# Patient Record
Sex: Male | Born: 1955
Health system: Southern US, Community
[De-identification: ages and names within clinical notes are randomized; demographics above are authoritative.]

## PROBLEM LIST (undated history)

## (undated) DIAGNOSIS — E785 Hyperlipidemia, unspecified: Secondary | ICD-10-CM

## (undated) DIAGNOSIS — I469 Cardiac arrest, cause unspecified: Secondary | ICD-10-CM

## (undated) DIAGNOSIS — H269 Unspecified cataract: Secondary | ICD-10-CM

## (undated) DIAGNOSIS — I2510 Atherosclerotic heart disease of native coronary artery without angina pectoris: Secondary | ICD-10-CM

## (undated) DIAGNOSIS — H3500 Unspecified background retinopathy: Secondary | ICD-10-CM

## (undated) DIAGNOSIS — R413 Other amnesia: Secondary | ICD-10-CM

## (undated) DIAGNOSIS — I219 Acute myocardial infarction, unspecified: Secondary | ICD-10-CM

## (undated) DIAGNOSIS — Z955 Presence of coronary angioplasty implant and graft: Secondary | ICD-10-CM

## (undated) DIAGNOSIS — I10 Essential (primary) hypertension: Secondary | ICD-10-CM

## (undated) DIAGNOSIS — I509 Heart failure, unspecified: Secondary | ICD-10-CM

## (undated) DIAGNOSIS — E119 Type 2 diabetes mellitus without complications: Secondary | ICD-10-CM

## (undated) DIAGNOSIS — E11319 Type 2 diabetes mellitus with unspecified diabetic retinopathy without macular edema: Secondary | ICD-10-CM

## (undated) DIAGNOSIS — E538 Deficiency of other specified B group vitamins: Secondary | ICD-10-CM

## (undated) DIAGNOSIS — H4312 Vitreous hemorrhage, left eye: Secondary | ICD-10-CM

## (undated) HISTORY — PX: COLONOSCOPY: SHX174

## (undated) HISTORY — DX: Cardiac arrest, cause unspecified: I46.9

## (undated) HISTORY — PX: VITRECTOMY: SHX106

## (undated) HISTORY — PX: REFRACTIVE SURGERY: SHX103

## (undated) HISTORY — DX: Unspecified cataract: H26.9

## (undated) HISTORY — DX: Deficiency of other specified B group vitamins: E53.8

## (undated) HISTORY — PX: EYE SURGERY: SHX253

## (undated) HISTORY — DX: Acute myocardial infarction, unspecified: I21.9

## (undated) HISTORY — DX: Heart failure, unspecified: I50.9

## (undated) HISTORY — DX: Presence of coronary angioplasty implant and graft: Z95.5

## (undated) HISTORY — PX: UPPER GI ENDOSCOPY: SHX6162

## (undated) HISTORY — DX: Hyperlipidemia, unspecified: E78.5

## (undated) HISTORY — DX: Type 2 diabetes mellitus without complications: E11.9

## (undated) HISTORY — DX: Unspecified background retinopathy: H35.00

## (undated) HISTORY — PX: ANGIOPLASTY: SHX39

## (undated) HISTORY — DX: Essential (primary) hypertension: I10

## (undated) HISTORY — DX: Type 2 diabetes mellitus with unspecified diabetic retinopathy without macular edema: E11.319

---

## 2013-08-26 LAB — HM COLONOSCOPY

## 2014-09-07 ENCOUNTER — Ambulatory Visit: Payer: Self-pay

## 2016-04-15 ENCOUNTER — Other Ambulatory Visit: Payer: Self-pay | Admitting: Internal Medicine

## 2016-04-17 ENCOUNTER — Encounter: Payer: Self-pay | Admitting: Internal Medicine

## 2016-04-24 ENCOUNTER — Other Ambulatory Visit: Payer: Self-pay | Admitting: Internal Medicine

## 2016-04-25 ENCOUNTER — Other Ambulatory Visit: Payer: Self-pay | Admitting: Internal Medicine

## 2016-04-25 ENCOUNTER — Other Ambulatory Visit: Payer: BLUE CROSS/BLUE SHIELD | Admitting: Internal Medicine

## 2016-04-25 DIAGNOSIS — Z0000 Encounter for general adult medical examination without abnormal findings: Secondary | ICD-10-CM

## 2016-04-25 LAB — COMPREHENSIVE METABOLIC PANEL
ALT: 19 U/L (ref 9–46)
AST: 17 U/L (ref 10–35)
Albumin: 4.2 g/dL (ref 3.6–5.1)
Alkaline Phosphatase: 60 U/L (ref 40–115)
BUN: 19 mg/dL (ref 7–25)
CO2: 27 mmol/L (ref 20–31)
Calcium: 9.2 mg/dL (ref 8.6–10.3)
Chloride: 104 mmol/L (ref 98–110)
Creat: 1.15 mg/dL (ref 0.70–1.25)
Glucose, Bld: 172 mg/dL — ABNORMAL HIGH (ref 65–99)
Potassium: 5.2 mmol/L (ref 3.5–5.3)
Sodium: 140 mmol/L (ref 135–146)
Total Bilirubin: 0.5 mg/dL (ref 0.2–1.2)
Total Protein: 6.9 g/dL (ref 6.1–8.1)

## 2016-04-25 LAB — CBC WITH DIFFERENTIAL/PLATELET
Basophils Absolute: 0 cells/uL (ref 0–200)
Basophils Relative: 0 %
Eosinophils Absolute: 180 cells/uL (ref 15–500)
Eosinophils Relative: 2 %
HCT: 42.2 % (ref 38.5–50.0)
Hemoglobin: 14.2 g/dL (ref 13.2–17.1)
Lymphocytes Relative: 27 %
Lymphs Abs: 2430 cells/uL (ref 850–3900)
MCH: 30.8 pg (ref 27.0–33.0)
MCHC: 33.6 g/dL (ref 32.0–36.0)
MCV: 91.5 fL (ref 80.0–100.0)
MPV: 10.3 fL (ref 7.5–12.5)
Monocytes Absolute: 990 cells/uL — ABNORMAL HIGH (ref 200–950)
Monocytes Relative: 11 %
Neutro Abs: 5400 cells/uL (ref 1500–7800)
Neutrophils Relative %: 60 %
Platelets: 363 10*3/uL (ref 140–400)
RBC: 4.61 MIL/uL (ref 4.20–5.80)
RDW: 13.6 % (ref 11.0–15.0)
WBC: 9 10*3/uL (ref 3.8–10.8)

## 2016-04-25 LAB — LIPID PANEL
Cholesterol: 113 mg/dL — ABNORMAL LOW (ref 125–200)
HDL: 35 mg/dL — ABNORMAL LOW (ref >=40)
LDL Cholesterol: 59 mg/dL (ref <130)
Total CHOL/HDL Ratio: 3.2 Ratio (ref <=5.0)
Triglycerides: 97 mg/dL (ref <150)
VLDL: 19 mg/dL (ref <30)

## 2016-04-25 LAB — PSA: PSA: 1.2 ng/mL (ref <=4.0)

## 2016-04-25 LAB — TSH: TSH: 5.12 mIU/L — ABNORMAL HIGH (ref 0.40–4.50)

## 2016-04-25 NOTE — Progress Notes (Signed)
For labs only

## 2016-04-26 LAB — VITAMIN D 25 HYDROXY (VIT D DEFICIENCY, FRACTURES): Vit D, 25-Hydroxy: 27 ng/mL — ABNORMAL LOW (ref 30–100)

## 2016-04-28 ENCOUNTER — Ambulatory Visit (INDEPENDENT_AMBULATORY_CARE_PROVIDER_SITE_OTHER): Payer: BLUE CROSS/BLUE SHIELD | Admitting: Internal Medicine

## 2016-04-28 ENCOUNTER — Encounter: Payer: Self-pay | Admitting: Internal Medicine

## 2016-04-28 VITALS — BP 118/72 | HR 80 | Temp 97.8°F | Ht 68.0 in | Wt 186.5 lb

## 2016-04-28 DIAGNOSIS — Z9861 Coronary angioplasty status: Secondary | ICD-10-CM

## 2016-04-28 DIAGNOSIS — R946 Abnormal results of thyroid function studies: Secondary | ICD-10-CM | POA: Diagnosis not present

## 2016-04-28 DIAGNOSIS — B351 Tinea unguium: Secondary | ICD-10-CM

## 2016-04-28 DIAGNOSIS — IMO0001 Reserved for inherently not codable concepts without codable children: Secondary | ICD-10-CM

## 2016-04-28 DIAGNOSIS — Z23 Encounter for immunization: Secondary | ICD-10-CM

## 2016-04-28 DIAGNOSIS — H4313 Vitreous hemorrhage, bilateral: Secondary | ICD-10-CM | POA: Diagnosis not present

## 2016-04-28 DIAGNOSIS — Z794 Long term (current) use of insulin: Secondary | ICD-10-CM

## 2016-04-28 DIAGNOSIS — R5383 Other fatigue: Secondary | ICD-10-CM

## 2016-04-28 DIAGNOSIS — I252 Old myocardial infarction: Secondary | ICD-10-CM

## 2016-04-28 DIAGNOSIS — E119 Type 2 diabetes mellitus without complications: Secondary | ICD-10-CM

## 2016-04-28 DIAGNOSIS — Z0000 Encounter for general adult medical examination without abnormal findings: Secondary | ICD-10-CM | POA: Diagnosis not present

## 2016-04-28 DIAGNOSIS — R7989 Other specified abnormal findings of blood chemistry: Secondary | ICD-10-CM

## 2016-04-28 DIAGNOSIS — R0602 Shortness of breath: Secondary | ICD-10-CM

## 2016-04-28 MED ORDER — INSULIN ASPART 100 UNIT/ML ~~LOC~~ SOLN: 100.0000 [IU] | Freq: Three times a day (TID) | SUBCUTANEOUS | 5 refills | Status: DC

## 2016-04-28 MED ORDER — INSULIN GLARGINE 100 UNIT/ML ~~LOC~~ SOLN: 100.0000 [IU] | Freq: Every day | SUBCUTANEOUS | 5 refills | Status: DC

## 2016-04-28 MED ORDER — CARVEDILOL 12.5 MG PO TABS: 12.5000 mg | ORAL_TABLET | Freq: Two times a day (BID) | ORAL | 5 refills | Status: DC

## 2016-04-28 MED ORDER — OMEPRAZOLE 40 MG PO CPDR: 40.0000 mg | DELAYED_RELEASE_CAPSULE | Freq: Two times a day (BID) | ORAL | 5 refills | Status: DC

## 2016-04-28 MED ORDER — ATORVASTATIN CALCIUM 80 MG PO TABS: 80.0000 mg | ORAL_TABLET | Freq: Every day | ORAL | 5 refills | Status: DC

## 2016-04-28 MED ORDER — METOCLOPRAMIDE HCL 10 MG PO TABS: 10.0000 mg | ORAL_TABLET | Freq: Four times a day (QID) | ORAL | 5 refills | Status: DC

## 2016-04-28 NOTE — Progress Notes (Signed)
Subjective:    Patient ID: Jeffrey [name], male    DOB: June 26, 1956, 60 y.o.   MRN: [id number]  HPI First visit for this 60 year old White Male who moved here recently from [place] with his brother. Pt referred by Carmie Kanner.  He is on disability due to coronary disease and CHF. Says he is weak and tired most of the time with some SOB. Longstanding hx of DM. Used to have insulin pump but requested this be removed and now takes Lantus and Novolog.  Hx diabetic neuropathy and retinopathy.  Needs to establish with Opthalmologist and cardiologist.  Worried about onychomycosis. Saws it embarrasses him.  [employer] retired on disability. Lives with brother. Used to work for a [employer] in [place].  Family history: Father died of a MI. Mother died of complications of Alzheimer's disease with history of CVA. One brother died of an MI.  Patient is intolerant of codeine it causes nausea and penicillins cause rash.  He sustained a cardiac arrest 11/23/2013 for which he was taken to [facility] in [place]. He had PCI of the LAD. An echo in March 2017 revealed anterior septal and apical septal hypokinesis. Left ventricular ejection fraction was approximately 55%. No valvular abnormalities were noted. Cardiac PET CT/myocardial perfusion scan done November 01, 2015 revealed a moderately sized moderately intensity minimal reversible defect involving the apex and apical to mid inferior lateral segments suggestive of predominant scar mixed with minimal peri-infarct ischemia. This was thought to be a low risk finding. This had not changed significantly from prior study done January 2016. Severe hypokinesis of the apex was noted with a left ventricular ejection fraction of 58%.  Repeat cardiac catheterization was recommended to him in June when he was seen by cardiologist in [place] Dr. Heloise Purpura but he declined.  He is on Lipitor 80 mg daily.  Social history: Has never smoked. Has never used  smokeless tobacco. He does drink alcohol socially. Does not use illicit drugs.  Patient's complaints of dyspnea and fatigue seem to be out of proportion to what cardiologist [place] found. He complained of shortness of breath and fatigue. He goes to bed early every evening.  History of vitreous hemorrhages in both eyes. Has seen a retinal specialist in [place]. Apparently had eye surgeries in 2016 in 2017. Do not have those records. They have been requested but not received.  Current medications include appear unknown, generic Lipitor, valsartan, Carbo Dial, furosemide, 81 mg of aspirin, omeprazole 40 mg twice daily, Reglan 10 mg 4 times daily, Lantus 40 units 2 times a day and NovoLog with meals.  Cardiology care was given at [facility] [address] [place] [address]  Cardiologist is Dr. Esmeralda Arthur, phone number, area code [phone]  Primary care physician  Is Roanna Epley, [place], telephone area code [phone]  Ophthalmologist is Dr. Diona Foley at [facility], [place] phone [phone]  BMI in June 2017 in [place] was 28.74  EKG in [place] and June 2017 showed left anterior hemiblock and poor R rate and progression in anterior precordial chest leads. Normal sinus rhythm noted.  Closest living relative is his brother Aaryav Schiffler who resides with him.  We will be scanning the PET/CT myocardial perfusion scan from March 2017 and 2-D echocardiogram from March 2017 as well as EKG from June 2017 into his Epic chart  Review of Systems  Constitutional: Positive for fatigue.  HENT: Negative.   Eyes:       Hx of retinopathy  Respiratory: Positive for shortness of breath.   Gastrointestinal:       Hx reflux and gastroparesis  Genitourinary: Negative.   Neurological:       Peripheral neuropathy to ankles. Used to take gabapentin but prickng sensation subsided so he d/ced med. Now  just has numbness  Psychiatric/Behavioral:       Anxious       Objective:   Physical Exam  Constitutional: He is oriented to person, place, and time. He appears well-developed and well-nourished. No distress.  HENT:  Head: Normocephalic and atraumatic.  Right Ear: External ear normal.  Left Ear: External ear normal.  Mouth/Throat: Oropharynx is clear and moist. No oropharyngeal exudate.  Eyes: Conjunctivae and EOM are normal. Pupils are equal, round, and reactive to light. Right eye exhibits no discharge. Left eye exhibits no discharge. No scleral icterus.  Neck: Neck supple. No JVD present. No thyromegaly present.  Cardiovascular: Normal rate, regular rhythm and normal heart sounds.   No murmur heard. Pulmonary/Chest: No respiratory distress. He has no wheezes.  Abdominal: Soft. Bowel sounds are normal. He exhibits no distension and no mass. There is no tenderness. There is no rebound and no guarding.  Genitourinary: Prostate normal.  Musculoskeletal: He exhibits no edema.  Lymphadenopathy:    He has no cervical adenopathy.  Neurological: He is alert and oriented to person, place, and time. He has normal reflexes.  Decreased sensation to mid ankles bilaterally  Skin: Skin is warm and dry. No rash noted. He is not diaphoretic.  onychomycosis  Psychiatric: He has a normal mood and affect. His behavior is normal. Judgment and thought content normal.  Vitals reviewed.         Assessment & Plan:  Coronary disease  s/p PCI 2015   ? CHF- c/o fatigue and SOB  Elevated TSH likely has hypothyroidism- discussed low dose Synthroid  But pt read side effects and declined. He may discuss with Endocrinologist  Diabetic retinopathy-needs ophthalmologist locally Diabetic neuropathy  Insulin-dependent diabetes mellitus hemoglobin AIC 9.4%-needs better control-referred to endocrinologist  Possible hypothyroidism-does not want to take thyroid replacement at this time. Offered to repeat  TSH in a few weeks. Will let endocrinologist assess this  Vitamin D deficiency-recommend 2000 units vitamin D 3 daily   GERD  Possible diabetic gastroparesis-that could be the reason he's taking Reglan  ? Depression  Onychomycosis-refer to dermatologist for treatment with? Jubilia.

## 2016-04-29 ENCOUNTER — Encounter: Payer: Self-pay | Admitting: Internal Medicine

## 2016-04-29 LAB — HEMOGLOBIN A1C
Hgb A1c MFr Bld: 9.4 % — ABNORMAL HIGH (ref <5.7)
Mean Plasma Glucose: 223 mg/dL

## 2016-04-29 NOTE — Patient Instructions (Signed)
Referrals to cardiology, endocrinology and dermatology. Flu vaccine given. Discussed elevated TSH with endocrinologist. Return in 6 months.

## 2016-05-02 ENCOUNTER — Telehealth: Payer: Self-pay | Admitting: *Deleted

## 2016-05-02 ENCOUNTER — Encounter: Payer: Self-pay | Admitting: Endocrinology

## 2016-05-02 ENCOUNTER — Ambulatory Visit (INDEPENDENT_AMBULATORY_CARE_PROVIDER_SITE_OTHER): Payer: BLUE CROSS/BLUE SHIELD | Admitting: Endocrinology

## 2016-05-02 VITALS — BP 116/62 | HR 75 | Temp 98.2°F | Resp 16 | Ht 68.0 in | Wt 190.0 lb

## 2016-05-02 DIAGNOSIS — I10 Essential (primary) hypertension: Secondary | ICD-10-CM

## 2016-05-02 DIAGNOSIS — E118 Type 2 diabetes mellitus with unspecified complications: Secondary | ICD-10-CM

## 2016-05-02 DIAGNOSIS — I152 Hypertension secondary to endocrine disorders: Secondary | ICD-10-CM | POA: Insufficient documentation

## 2016-05-02 DIAGNOSIS — E1165 Type 2 diabetes mellitus with hyperglycemia: Secondary | ICD-10-CM

## 2016-05-02 DIAGNOSIS — Z794 Long term (current) use of insulin: Secondary | ICD-10-CM | POA: Diagnosis not present

## 2016-05-02 DIAGNOSIS — E119 Type 2 diabetes mellitus without complications: Secondary | ICD-10-CM | POA: Insufficient documentation

## 2016-05-02 DIAGNOSIS — E1159 Type 2 diabetes mellitus with other circulatory complications: Secondary | ICD-10-CM | POA: Insufficient documentation

## 2016-05-02 MED ORDER — ONETOUCH DELICA LANCETS 33G MISC: 3 refills | Status: DC

## 2016-05-02 MED ORDER — GLUCOSE BLOOD VI STRP: ORAL_STRIP | 3 refills | Status: DC

## 2016-05-02 NOTE — Patient Instructions (Addendum)
Check blood sugars on waking up = every other day  Also check blood sugars once a day before a different meals daily  Every other day check sugar 2-3 hours after dinner  Recommended blood sugar levels on waking up is 90-130 and about 2 hours after meal is 130-160  Please bring your blood sugar monitor to each visit, thank you  LANTUS: Continue taking 22 units twice a day  NOVOLOG: Take 5 units before breakfast and lunch and 7 units before dinner.  May take extra 1-2 units for eating more starchy foods or dessert  BREAKFAST: Try to add a protein like a boiled egg, slice of low fat cheese or meat

## 2016-05-02 NOTE — Progress Notes (Addendum)
Patient ID: Jeffrey [name], male   DOB: May 10, 1956, 60 y.o.   MRN: [id number]            Reason for Appointment: Consultation for Type 2 Diabetes  Referring physician: [place]   History of Present Illness:          Date of diagnosis of type 2 diabetes mellitus: In his 88s       Background history:   He was treated with some oral agents in the first few years of diagnosis with diabetes but subsequently has been on insulin using various regimens Level of control previously is not known as he is new to the town and no previous records are available  Recent history:   INSULIN regimen is: 22 Lantus bid. Novolog 5 units qid       Non-insulin hypoglycemic drugs the patient is taking are: none  Current management, blood sugar patterns and problems identified:   he has not A record of his blood sugars and is using Generic  Monitor  Mostly checking blood sugars fasting which are recently fairly good overall with some variability  Has sporadic high readings at night after dinner but not  Usually high at lunch, occasionally higher around dinner   he is taking the same amount of insulin  With every meal regardless of what he is eating.  He has been on the same dose of Lantus for quite sometime  Usually not getting any protein with his breakfast.  He has very limited activity level because of his dyspnea on exertion, prior to retirement he was more active throughout the day     Compliance with the medical regimen: variable Hypoglycemia: Rarely   Glucose monitoring:  done 2-3 times a day         Glucometer: One Touch.      Blood Glucose readings by review of home meter with overall average 228  Self-care: The diet that the patient has been following is: none, usually not eating out much and not drinking sweetened drinks       Meal times are:  Breakfast is at 8 Lunch: Dinner: 7   Typical meal intake: Breakfast is oatmeal  Lunch is sandwich , dinner is chicken or fish with pasta and  salad               Dietician visit, most recent: never               Exercise: a little  walking  Weight history:   Wt Readings from Last 3 Encounters:  05/02/16 190 lb (86.2 kg)  04/28/16 186 lb 8 oz (84.6 kg)    Glycemic control:   Lab Results  Component Value Date   HGBA1C 9.4 (H) 04/25/2016   Lab Results  Component Value Date   LDLCALC 59 04/25/2016   CREATININE 1.15 04/25/2016   No results found for: [facility]       Medication List       Accurate as of 05/02/16  2:55 PM. Always use your most recent med list.          atorvastatin 80 MG tablet Commonly known as:  LIPITOR Take 1 tablet (80 mg total) by mouth daily.   carvedilol 12.5 MG tablet Commonly known as:  COREG Take 1 tablet (12.5 mg total) by mouth 2 (two) times daily with a meal.   eplerenone 25 MG tablet Commonly known as:  INSPRA Take 25 mg by mouth daily.   furosemide 40 MG tablet Commonly known  as:  LASIX Take 40 mg by mouth daily.   insulin aspart 100 UNIT/ML injection Commonly known as:  novoLOG Inject 100 Units into the skin 3 (three) times daily before meals.   insulin glargine 100 UNIT/ML injection Commonly known as:  LANTUS Inject 1 mL (100 Units total) into the skin at bedtime.   metoCLOPramide 10 MG tablet Commonly known as:  REGLAN Take 1 tablet (10 mg total) by mouth 4 (four) times daily.   omeprazole 40 MG capsule Commonly known as:  PRILOSEC Take 1 capsule (40 mg total) by mouth 2 (two) times daily.   valsartan 80 MG tablet Commonly known as:  DIOVAN Take 80 mg by mouth daily.       Allergies: Not on File  Past Medical History:  Diagnosis Date  . Myocardial infarct ([place]) 2105    No past surgical history on file.  No family history on file.  Social History:  reports that he has never smoked. He has never used smokeless tobacco. He reports that he drinks alcohol. He reports that he does not use drugs.   Review of Systems  Constitutional: Negative  for weight loss.  HENT: Negative for headaches.   Eyes: Negative for visual disturbance.  Respiratory: Positive for shortness of breath.   Cardiovascular: Positive for palpitations. Negative for leg swelling.  Gastrointestinal: Negative for nausea and abdominal pain.       He has had long-standing history of reflux and previous gastroenterologist  told him to take Prilosec twice a day and Reglan 4 times a day, no recent symptoms  Endocrine: Negative for fatigue, general weakness and polydipsia.  Musculoskeletal: Negative for joint pain.  Skin: Negative for itching.  Neurological:       He is tending to be more forgetful Occasional tingling in feet.  Previously had sharp pains treated with gabapentin in his legs  Psychiatric/Behavioral: Positive for nervousness.     Lipid history: On Lipitor with good control    Lab Results  Component Value Date   CHOL 113 (L) 04/25/2016   HDL 35 (L) 04/25/2016   LDLCALC 59 04/25/2016   TRIG 97 04/25/2016   CHOLHDL 3.2 04/25/2016           Hypertension: Present, has been recently controlled, also on carvedilol for history of CHF along with Diovan  Most recent eye exam was 2017, has had Laser Treatment for retinopathy  Most recent foot exam: 9/17 No recent pains in  feet or complaints of numbness but occasional tingling   LABS:  No visits with results within 1 Week(s) from this visit.  Latest known visit with results is:  Orders Only on 04/25/2016  Component Date Value Ref Range Status  . Hgb A1c MFr Bld 04/29/2016 9.4* <5.7 % Final   Comment:   For someone without known diabetes, a hemoglobin A1c value of 6.5% or greater indicates that they may have diabetes and this should be confirmed with a follow-up test.   For someone with known diabetes, a value <7% indicates that their diabetes is well controlled and a value greater than or equal to 7% indicates suboptimal control. A1c targets should be individualized based on duration of  diabetes, age, comorbid conditions, and other considerations.   Currently, no consensus exists for use of hemoglobin A1c for diagnosis of diabetes for children.     . Mean Plasma Glucose 04/29/2016 223  mg/dL Final    Physical Examination:  BP 116/62   Pulse 75   Temp 98.2 F (  36.8 C)   Resp 16   Ht 5\' 8"  (1.727 m)   Wt 190 lb (86.2 kg)   SpO2 95%   BMI 28.89 kg/m   GENERAL:         Patient has Abdominal obesity.   HEENT:         Eye exam shows normal external appearance. Fundus exam shows no retinopathy. Oral exam shows normal mucosa .  NECK:   There is no lymphadenopathy Thyroid is not enlarged and no nodules felt.  Carotids are normal to palpation and no bruit heard LUNGS:         Chest is symmetrical. Lungs are clear to auscultation.[name] Kitchen   HEART:         Heart sounds:  S1 and S2 are normal. No murmur or click heard., no S3 or S4.   ABDOMEN:   There is no distention present. Liver and spleen are not palpable. No other mass or tenderness present.   NEUROLOGICAL:   Ankle jerks are absent bilaterally.    Diabetic Foot Exam - Simple   Simple Foot Form Diabetic Foot exam was performed with the following findings:  Yes 05/02/2016  2:53 PM  Visual Inspection See comments:  Yes Sensation Testing See comments:  Yes Pulse Check Posterior Tibialis and Dorsalis pulse intact bilaterally:  Yes Comments Absent monofilament sensation in feet Mild onychomycosis present            Vibration sense is  Absent in distal first toes. MUSCULOSKELETAL:  There is no swelling or deformity of the peripheral joints. Spine is normal to inspection.   EXTREMITIES:     There is no edema. No skin lesions present.[name] Kitchen SKIN:       No rash or lesions of concern.        ASSESSMENT:  Diabetes, uncontrolled He has had diabetes for 40 years and although he may have started with type 2 diabetes he is essentially insulin-dependent Recently has had poor control with A1c over 9% which he thinks is partly from  his difficulty with compliance with diet and insulin before his move here Difficult to get an idea what his blood sugar patterns are since he is using a Generic monitor and not keeping any record Currently taking only fixed doses of 5 units of Novolog at mealtimes Fasting readings are also variable and not clear if his meter is accurate     Complications of diabetes: Peripheral neuropathy with sensory loss, history of retinopathy  HYPERTENSION: Well controlled, also on carvedilol for history of CHF along with Diovan  PLAN:    Continue Lantus 22 units twice a day but consider switching to at least the SoloSTAR pen  If able to get coverage may prefer using Toujeo once a day   Since he appears to have mostly high readings after supper he will take 7 units of NovoLog before eating  He will need to discuss insulin adjustment in detail with nurse educator  Add protein to breakfast daily  He will avoid taking any Novolog at bedtime to avoid potential hypoglycemia  He was instructed on how to use a One Touch ultra mini testing meter as this appears to be covered  Booklet on foot care given, discussed general care principles to avoid neuropathic ulcers  Patient Instructions  Check blood sugars on waking up = every other day  Also check blood sugars once a day before a different meals daily  Every other day check sugar 2-3 hours after dinner  Recommended blood  sugar levels on waking up is 90-130 and about 2 hours after meal is 130-160  Please bring your blood sugar monitor to each visit, thank you  LANTUS: Continue taking 22 units twice a day  NOVOLOG: Take 5 units before breakfast and lunch and 7 units before dinner.  May take extra 1-2 units for eating more starchy foods or dessert  BREAKFAST: Try to add a protein like a boiled egg, slice of low fat cheese or meat   Counseling time on subjects discussed above is over 50% of today's 60 minute visit  Cyenna Rebello 05/02/2016, 2:55 PM    Note: This office note was prepared with Estate agent. Any transcriptional errors that result from this process are unintentional.

## 2016-05-02 NOTE — Telephone Encounter (Signed)
[facility] stated patient insurance will not cover his prescriptions please advise [phone]

## 2016-05-05 ENCOUNTER — Telehealth: Payer: Self-pay | Admitting: Endocrinology

## 2016-05-05 ENCOUNTER — Other Ambulatory Visit: Payer: Self-pay | Admitting: *Deleted

## 2016-05-05 MED ORDER — BAYER CONTOUR NEXT MONITOR W/DEVICE KIT: PACK | 0 refills | Status: DC

## 2016-05-05 MED ORDER — GLUCOSE BLOOD VI STRP: ORAL_STRIP | 3 refills | Status: DC

## 2016-05-05 MED ORDER — BAYER MICROLET LANCETS MISC: 2 refills | Status: DC

## 2016-05-05 NOTE — Telephone Encounter (Signed)
Rx sent for contour meter and strips, lancets.

## 2016-05-05 NOTE — Telephone Encounter (Signed)
Jeffrey [name] from [place] stated patient insurance will only cover the countor next meter test, need a prescription

## 2016-05-08 ENCOUNTER — Other Ambulatory Visit: Payer: Self-pay | Admitting: *Deleted

## 2016-05-08 MED ORDER — GLUCOSE BLOOD VI STRP: ORAL_STRIP | 3 refills | Status: DC

## 2016-05-08 MED ORDER — BAYER MICROLET LANCETS MISC: 2 refills | Status: DC

## 2016-05-08 NOTE — Telephone Encounter (Signed)
Increase to 4 times a day

## 2016-05-08 NOTE — Telephone Encounter (Signed)
Please see below and advise if okay to increase the amount of strips?

## 2016-05-08 NOTE — Telephone Encounter (Signed)
Noted, rx sent.

## 2016-05-08 NOTE — Telephone Encounter (Signed)
Pt is stating he has been increased to checking his BS 4 times daily instead of 3 times daily and the lancets please to be called in  [facility] [address]

## 2016-05-12 ENCOUNTER — Telehealth: Payer: Self-pay | Admitting: Internal Medicine

## 2016-05-12 ENCOUNTER — Other Ambulatory Visit: Payer: Self-pay | Admitting: *Deleted

## 2016-05-12 MED ORDER — FUROSEMIDE 40 MG PO TABS: 40.0000 mg | ORAL_TABLET | Freq: Every day | ORAL | 5 refills | Status: DC

## 2016-05-12 MED ORDER — EPLERENONE 25 MG PO TABS: 25.0000 mg | ORAL_TABLET | Freq: Every day | ORAL | 5 refills | Status: DC

## 2016-05-12 MED ORDER — VALSARTAN 80 MG PO TABS: 80.0000 mg | ORAL_TABLET | Freq: Every day | ORAL | 5 refills | Status: DC

## 2016-05-12 NOTE — Telephone Encounter (Signed)
Requesting refills on his Lasix, Valsartan and Eplerenone (Inspra).  He would like to have 5 refills (to total 6) like he has on the rest of his medications.  Can he please have these called in to his pharmacy?  [facility] [address]  Thank you.

## 2016-05-12 NOTE — Telephone Encounter (Signed)
Please refill as requested.

## 2016-05-19 ENCOUNTER — Telehealth: Payer: Self-pay | Admitting: Internal Medicine

## 2016-05-19 ENCOUNTER — Other Ambulatory Visit: Payer: Self-pay | Admitting: *Deleted

## 2016-05-19 MED ORDER — FUROSEMIDE 40 MG PO TABS: 40.0000 mg | ORAL_TABLET | Freq: Every day | ORAL | 1 refills | Status: DC

## 2016-05-19 NOTE — Telephone Encounter (Signed)
Patient said she would like a refill on her Furosemide 40mg  tablet.

## 2016-05-19 NOTE — Telephone Encounter (Signed)
The patient is a male not a male. Please refill x 90 days.

## 2016-05-19 NOTE — Telephone Encounter (Signed)
Done

## 2016-05-20 ENCOUNTER — Telehealth: Payer: Self-pay | Admitting: Internal Medicine

## 2016-05-20 NOTE — Telephone Encounter (Signed)
He may call Dr. Nena Polio for appointment.

## 2016-05-20 NOTE — Telephone Encounter (Signed)
Left Dr. Hilarie Fredrickson. [place] contact information on the patient's answering machine.

## 2016-05-20 NOTE — Telephone Encounter (Signed)
Patient would like a referral to a male Dermatologist for a routine body check.

## 2016-05-21 ENCOUNTER — Encounter: Payer: Self-pay | Admitting: Dietician

## 2016-05-21 ENCOUNTER — Encounter: Payer: BLUE CROSS/BLUE SHIELD | Attending: Internal Medicine | Admitting: Dietician

## 2016-05-21 ENCOUNTER — Other Ambulatory Visit: Payer: Self-pay | Admitting: *Deleted

## 2016-05-21 DIAGNOSIS — Z713 Dietary counseling and surveillance: Secondary | ICD-10-CM | POA: Insufficient documentation

## 2016-05-21 DIAGNOSIS — E119 Type 2 diabetes mellitus without complications: Secondary | ICD-10-CM | POA: Diagnosis not present

## 2016-05-21 DIAGNOSIS — Z794 Long term (current) use of insulin: Secondary | ICD-10-CM | POA: Diagnosis not present

## 2016-05-21 DIAGNOSIS — E1165 Type 2 diabetes mellitus with hyperglycemia: Secondary | ICD-10-CM

## 2016-05-21 MED ORDER — "INSULIN SYRINGE-NEEDLE U-100 30G X 5/16"" 1 ML MISC": 3 refills | Status: DC

## 2016-05-21 NOTE — Patient Instructions (Signed)
When you have a dessert, keep the portion size small. When you are at a party, choose small amounts of the cheese, veges, fresh fruit and limit sweets.  Choose something without sugar to drink. Add a protein such as a boiled egg or walnuts or almonds to your oatmeal. Be sure to have a protein with lunch (leftover meat from dinner, low sodium [place], or swiss cheese). When you treat a low blood sugar.  Drink 1/2 cup juice, recheck your blood sugar and if it is still low then have 1/2 cup more of juice.  When your blood sugar returns to normal, then have a meal or snack with protein. Continue to avoid added salt, rinse canned beans and avoid processed foods. Eating out has a lot of sodium.  It is best to go to a restaurant that you can ask them not to add salt to your foods.

## 2016-05-21 NOTE — Progress Notes (Signed)
Cardiology Office Note   Date:  05/22/2016   ID:  Jeffrey [name], DOB 1955/12/02, MRN [id number]  PCP:  Elby Showers, MD  Cardiologist:   Skeet Latch, MD   Chief Complaint  Patient presents with  . New Evaluation    pt moved from [place], here to establish care, pt had a heart attack in 2015.      History of Present Illness: Jeffrey [name] is a 60 y.o. male with hypertension, hyperlipidemia, diabetes, peripheral neuropathy, and heart failure who presents to establish care.  Jeffrey [name] recently moved to [facility] from [place] and presents to establish care.  He moved here with his identical twin brother.  Jeffrey [name] had a cardiac arrest 11/23/13 and underwent PCI of the LAD.  Echo 10/2015 revealed an anteroseptal and apical septal infarct with associated hypokinesis.  LVEF was 55%.  He had a PET CT/myocardial perfusion scan 10/2015 that revealed a moderate sized, moderate intensity antero-apical infarct with mild ischemia.  At the end of 2016 he had an episode of stabbing chest pain.  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  At that time his clopidogrel was discontinued and he underwent several laser procedures.    He has worked in a hospice facility as an Administrator, arts.  He is now on disability because he is unable to lift patients.  He goes walking at least three times per week which takes approximately 30 minutes.  He denies chest pain or shortness of breath with this activity.  He hasn't noted any lower extremity edema, orthopnea or PND.  He gets short of breath with ADLs.    Past Medical History:  Diagnosis Date  . Cardiac arrest ([place]) 05/22/2016  . CHF (congestive heart failure) ([place])   . Diabetes mellitus without complication ([place])   . Hyperlipidemia   . Hypertension   . Myocardial infarct 2105  . S/P primary angioplasty with coronary stent 05/22/2016    History reviewed. No pertinent surgical history.   Current  Outpatient Prescriptions  Medication Sig Dispense Refill  . aspirin EC 81 MG tablet Take 81 mg by mouth daily.    [name] Kitchen atorvastatin (LIPITOR) 80 MG tablet Take 1 tablet (80 mg total) by mouth daily. 30 tablet 5  . carvedilol (COREG) 12.5 MG tablet Take 1 tablet (12.5 mg total) by mouth 2 (two) times daily with a meal. 60 tablet 5  . cholecalciferol (VITAMIN D) 1000 units tablet Take 2,000 Units by mouth daily.    [name] Kitchen eplerenone (INSPRA) 25 MG tablet Take 1 tablet (25 mg total) by mouth daily. 30 tablet 5  . furosemide (LASIX) 40 MG tablet Take 1 tablet (40 mg total) by mouth daily. 90 tablet 1  . insulin aspart (NOVOLOG) 100 UNIT/ML injection Inject 100 Units into the skin 3 (three) times daily before meals. (Patient taking differently: Inject 5 Units into the skin 3 (three) times daily with meals. ) 30 mL 5  . insulin glargine (LANTUS) 100 UNIT/ML injection Inject 1 mL (100 Units total) into the skin at bedtime. (Patient taking differently: Inject 22 Units into the skin 2 (two) times daily. ) 30 mL 5  . metoCLOPramide (REGLAN) 10 MG tablet Take 1 tablet (10 mg total) by mouth 4 (four) times daily. 120 tablet 5  . omeprazole (PRILOSEC) 40 MG capsule Take 1 capsule (40 mg total) by mouth 2 (two) times daily. 60 capsule 5  . valsartan (DIOVAN) 80 MG  tablet Take 1 tablet (80 mg total) by mouth daily. 30 tablet 5   No current facility-administered medications for this visit.     Allergies:   Codeine and Penicillins    Social History:  The patient  reports that he has never smoked. He has never used smokeless tobacco. He reports that he drinks about 0.6 oz of alcohol per week . He reports that he does not use drugs.   Family History:  The patient's family history includes Alzheimer's disease in his mother; Heart disease in his brother and father; Peripheral Artery Disease in his father.    ROS:  Please see the history of present illness.   Otherwise, review of systems are positive for none.   All  other systems are reviewed and negative.    PHYSICAL EXAM: VS:  BP (!) 146/84   Pulse 63   Ht 5\' 8"  (1.727 m)   Wt 189 lb 6.4 oz (85.9 kg)   BMI 28.80 kg/m  , BMI Body mass index is 28.8 kg/m. GENERAL:  Well appearing HEENT:  Pupils equal round and reactive, fundi not visualized, oral mucosa unremarkable NECK:  No jugular venous distention, waveform within normal limits, carotid upstroke brisk and symmetric, no bruits, no thyromegaly LYMPHATICS:  No cervical adenopathy LUNGS:  Clear to auscultation bilaterally HEART:  RRR.  PMI not displaced or sustained,S1 and S2 within normal limits, no S3, no S4, no clicks, no rubs, no  murmurs ABD:  Flat, positive bowel sounds normal in frequency in pitch, no bruits, no rebound, no guarding, no midline pulsatile mass, no hepatomegaly, no splenomegaly EXT:  2 plus pulses throughout, no edema, no cyanosis no clubbing SKIN:  No rashes no nodules NEURO:  Cranial nerves II through XII grossly intact, motor grossly intact throughout PSYCH:  Cognitively intact, oriented to person place and time    EKG:  EKG is ordered today. The ekg ordered today demonstrates sinus rhythm rate 63 bpm.  Prior anteroseptal infarct.  LAFB   Recent Labs: 04/25/2016: ALT 19; BUN 19; Creat 1.15; Hemoglobin 14.2; Platelets 363; Potassium 5.2; Sodium 140; TSH 5.12    Lipid Panel    Component Value Date/Time   CHOL 113 (L) 04/25/2016 1156   TRIG 97 04/25/2016 1156   HDL 35 (L) 04/25/2016 1156   CHOLHDL 3.2 04/25/2016 1156   VLDL 19 04/25/2016 1156   LDLCALC 59 04/25/2016 1156    09/07/15: Sodium 138, potassium 4.5, BUN 20, creatinine 1.02 AST 17, ALT 24 Total cholesterol 113, triglycerides 146, HDL 32, LDL 52 TSH 1.76 WBC 11.7, hemoglobin 13.4, hematocrit 40.2, platelets 317 Hemoglobin A1c 8.9 on a relatively dry%  Wt Readings from Last 3 Encounters:  05/22/16 189 lb 6.4 oz (85.9 kg)  05/21/16 192 lb (87.1 kg)  05/02/16 190 lb (86.2 kg)      ASSESSMENT  AND PLAN:  # CAD s/p cardiac arrest and LAD PCI: Jeffrey [name] is doing well and denies any recent chest pain.  Continue carvedilol, aspirin 81 mg and atorvastatin.    # Hypertension:  Blood pressure is well-controlled on carvedilol and valsartan.  Repeat BP was 126/72.  # Hyperlipidemia.  LDL 59 on 04/2016.  Continue atorvastatin.     Current medicines are reviewed at length with the patient today.  The patient does not have concerns regarding medicines.  The following changes have been made:  no change  Labs/ tests ordered today include:  No orders of the defined types were placed in this encounter.  Disposition:   FU with Aarya Robinson C. Oval Linsey, MD, [facility] in 6 months   This note was written with the assistance of speech recognition software.  Please excuse any transcriptional errors.  Signed, Jaquavion Mccannon C. Oval Linsey, MD, [facility]  05/22/2016 1:31 PM    [facility]

## 2016-05-22 ENCOUNTER — Ambulatory Visit (INDEPENDENT_AMBULATORY_CARE_PROVIDER_SITE_OTHER): Payer: BLUE CROSS/BLUE SHIELD | Admitting: Cardiovascular Disease

## 2016-05-22 ENCOUNTER — Encounter: Payer: Self-pay | Admitting: Cardiovascular Disease

## 2016-05-22 VITALS — BP 146/84 | HR 63 | Ht 68.0 in | Wt 189.4 lb

## 2016-05-22 DIAGNOSIS — I469 Cardiac arrest, cause unspecified: Secondary | ICD-10-CM

## 2016-05-22 DIAGNOSIS — Z8674 Personal history of sudden cardiac arrest: Secondary | ICD-10-CM | POA: Insufficient documentation

## 2016-05-22 DIAGNOSIS — Z955 Presence of coronary angioplasty implant and graft: Secondary | ICD-10-CM | POA: Diagnosis not present

## 2016-05-22 DIAGNOSIS — E7800 Pure hypercholesterolemia, unspecified: Secondary | ICD-10-CM

## 2016-05-22 DIAGNOSIS — I10 Essential (primary) hypertension: Secondary | ICD-10-CM | POA: Diagnosis not present

## 2016-05-22 HISTORY — DX: Cardiac arrest, cause unspecified: I46.9

## 2016-05-22 HISTORY — DX: Presence of coronary angioplasty implant and graft: Z95.5

## 2016-05-22 NOTE — Progress Notes (Signed)
Diabetes Self-Management Education  Visit Type: First/Initial  Appt. Start Time: 1100 Appt. End Time: [id number]  05/22/2016  [name], identified by name and date of birth, is a 60 y.o. male with a diagnosis of Diabetes: Type 2 (insulin dependent). For the past 40 years.  He has an identical twin brother who does not have diabetes.  Other hx includes vitamin D deficiency, severe neuropathy, MI requiring life support and resulting CHF.  He takes Lantus 22 units each am and 22 units each HS.  He takes Novolog tid 5-7 units depending on meal size.  His TSH was 5.12 and he just wants to monitor and refused medication.  His diet is very consistent except for when he is out to eat, at friends, or when friends bring food by.  He does not use added salt and mostly fresh foods. His short term memory is poor since his MI.  I do not feel he is capable of carbohydrate counting at this time.  Patient lives with his twin brother.  He moved from [place] in August due to it being less expensive to live here.  He is a retired Probation officer.  ASSESSMENT  Height 5\' 8"  (1.727 m), weight 192 lb (87.1 kg). Body mass index is 29.19 kg/m.      Diabetes Self-Management Education - 05/21/16 1140      Visit Information   Visit Type First/Initial     Initial Visit   Diabetes Type Type 2  insulin dependent   Are you currently following a meal plan? No   Are you taking your medications as prescribed? Yes   Date Diagnosed 20 years ago     Health Coping   How would you rate your overall health? Good     Psychosocial Assessment   Patient Belief/Attitude about Diabetes Motivated to manage diabetes   Self-care barriers Other (comment);Lack of material resources  some short term memory problems   Self-management support Doctor's office   Other persons present Patient   Patient Concerns Nutrition/Meal planning;Glycemic Control;Weight Control   Special Needs None   Preferred Learning Style No  preference indicated   Learning Readiness Ready   How often do you need to have someone help you when you read instructions, pamphlets, or other written materials from your doctor or pharmacy? 1 - Never   What is the last grade level you completed in school? 2 years college     Pre-Education Assessment   Patient understands the diabetes disease and treatment process. Demonstrates understanding / competency   Patient understands incorporating nutritional management into lifestyle. Needs Review   Patient undertands incorporating physical activity into lifestyle. Demonstrates understanding / competency   Patient understands using medications safely. Demonstrates understanding / competency   Patient understands monitoring blood glucose, interpreting and using results Demonstrates understanding / competency   Patient understands prevention, detection, and treatment of acute complications. Demonstrates understanding / competency   Patient understands prevention, detection, and treatment of chronic complications. Demonstrates understanding / competency   Patient understands how to develop strategies to address psychosocial issues. Needs Review   Patient understands how to develop strategies to promote health/change behavior. Demonstrates understanding / competency     Complications   Last HgB A1C per patient/outside source 9.4 %  04/29/16   How often do you check your blood sugar? 3-4 times/day   Fasting Blood glucose range (mg/dL) 70-129;130-179  in the last week but prior was over 200 often   Number of hypoglycemic episodes  per month 1   Can you tell when your blood sugar is low? Yes   What do you do if your blood sugar is low? drinks OJ   Number of hyperglycemic episodes per week 14   Can you tell when your blood sugar is high? Yes   What do you do if your blood sugar is high? drinks more water   Have you had a dilated eye exam in the past 12 months? Yes   Have you had a dental exam in the  past 12 months? Yes   Are you checking your feet? Yes   How many days per week are you checking your feet? 7     Dietary Intake   Breakfast 2 packs raisin cinnamon instant oatmeal, Fat free milk, 3 cups coffee with milk  8   Snack (morning) cashew   Lunch lettuce, tomato, and occasional cheese or low sodium [place] sandwich on rye bread with mayo and potato salad OR salad without protein and honey mustard dressing and potato salad   Snack (afternoon) occasional pie or cake or cookies   Dinner fish or chicken and vegetables, salad, red potatoes   Snack (evening) none   Beverage(s) water, coffee, fat free milk, hot tea, juice only with low blood sugar, occasional diet soda, occasional lite beer (1-2 cans per week)     Exercise   Exercise Type ADL's   How many days per week to you exercise? 0   How many minutes per day do you exercise? 0   Total minutes per week of exercise 0     Patient Education   Previous Diabetes Education Yes (please comment)  Yes but too long to remember   Disease state  Definition of diabetes, type 1 and 2, and the diagnosis of diabetes   Nutrition management  Role of diet in the treatment of diabetes and the relationship between the three main macronutrients and blood glucose level;Food label reading, portion sizes and measuring food.;Meal options for control of blood glucose level and chronic complications.;Information on hints to eating out and maintain blood glucose control.   Acute complications Taught treatment of hypoglycemia - the 15 rule.   Chronic complications Assessed and discussed foot care and prevention of foot problems   Psychosocial adjustment Worked with patient to identify barriers to care and solutions;Role of stress on diabetes   Personal strategies to promote health Lifestyle issues that need to be addressed for better diabetes care     Individualized Goals (developed by patient)   Nutrition General guidelines for healthy choices and portions  discussed   Physical Activity Not Applicable   Medications take my medication as prescribed   Monitoring  test my blood glucose as discussed   Problem Solving what to do in social situations and eating out   Reducing Risk examine blood glucose patterns;do foot checks daily;treat hypoglycemia with 15 grams of carbs if blood glucose less than 70mg /dL     Post-Education Assessment   Patient understands the diabetes disease and treatment process. Demonstrates understanding / competency   Patient understands incorporating nutritional management into lifestyle. Demonstrates understanding / competency   Patient undertands incorporating physical activity into lifestyle. Demonstrates understanding / competency   Patient understands using medications safely. Demonstrates understanding / competency   Patient understands monitoring blood glucose, interpreting and using results Demonstrates understanding / competency   Patient understands prevention, detection, and treatment of acute complications. Demonstrates understanding / competency   Patient understands prevention, detection, and treatment of  chronic complications. Demonstrates understanding / competency   Patient understands how to develop strategies to address psychosocial issues. Demonstrates understanding / competency   Patient understands how to develop strategies to promote health/change behavior. Demonstrates understanding / competency     Outcomes   Expected Outcomes Demonstrated interest in learning. Expect positive outcomes   Future DMSE PRN   Program Status Completed      Individualized Plan for Diabetes Self-Management Training:   Learning Objective:  Patient will have a greater understanding of diabetes self-management. Patient education plan is to attend individual and/or group sessions per assessed needs and concerns. Also discussed the importance of a strict low sodium diet.  Plan:   Patient Instructions  When you have a  dessert, keep the portion size small. When you are at a party, choose small amounts of the cheese, veges, fresh fruit and limit sweets.  Choose something without sugar to drink. Add a protein such as a boiled egg or walnuts or almonds to your oatmeal. Be sure to have a protein with lunch (leftover meat from dinner, low sodium [place], or swiss cheese). When you treat a low blood sugar.  Drink 1/2 cup juice, recheck your blood sugar and if it is still low then have 1/2 cup more of juice.  When your blood sugar returns to normal, then have a meal or snack with protein. Continue to avoid added salt, rinse canned beans and avoid processed foods. Eating out has a lot of sodium.  It is best to go to a restaurant that you can ask them not to add salt to your foods.     Expected Outcomes:  Demonstrated interest in learning. Expect positive outcomes  Education material provided: Living Well with Diabetes, Food label handouts, Meal plan card, My Plate and Snack sheet, label reading, low sodium label reading, Nutrition therapy for a low sodium diet.  If problems or questions, patient to contact team via:  Phone and Email  Future DSME appointment: PRN

## 2016-05-22 NOTE — Patient Instructions (Signed)

## 2016-05-23 ENCOUNTER — Other Ambulatory Visit: Payer: Self-pay | Admitting: Internal Medicine

## 2016-05-23 ENCOUNTER — Encounter: Payer: BLUE CROSS/BLUE SHIELD | Admitting: Dietician

## 2016-05-29 ENCOUNTER — Other Ambulatory Visit (INDEPENDENT_AMBULATORY_CARE_PROVIDER_SITE_OTHER): Payer: BLUE CROSS/BLUE SHIELD

## 2016-05-29 DIAGNOSIS — E1165 Type 2 diabetes mellitus with hyperglycemia: Secondary | ICD-10-CM | POA: Diagnosis not present

## 2016-05-29 DIAGNOSIS — Z794 Long term (current) use of insulin: Secondary | ICD-10-CM

## 2016-05-29 LAB — BASIC METABOLIC PANEL
BUN: 26 mg/dL — ABNORMAL HIGH (ref 6–23)
CO2: 29 mEq/L (ref 19–32)
Calcium: 9.5 mg/dL (ref 8.4–10.5)
Chloride: 101 mEq/L (ref 96–112)
Creatinine, Ser: 1.13 mg/dL (ref 0.40–1.50)
GFR: 69.99 mL/min (ref >60.00)
Glucose, Bld: 222 mg/dL — ABNORMAL HIGH (ref 70–99)
Potassium: 4.9 mEq/L (ref 3.5–5.1)
Sodium: 137 mEq/L (ref 135–145)

## 2016-05-29 LAB — MICROALBUMIN / CREATININE URINE RATIO
Creatinine,U: 67.9 mg/dL
Microalb Creat Ratio: 1 mg/g (ref 0.0–30.0)
Microalb, Ur: <0.7 mg/dL (ref 0.0–1.9)

## 2016-05-30 LAB — FRUCTOSAMINE: Fructosamine: 378 umol/L — ABNORMAL HIGH (ref 0–285)

## 2016-06-03 ENCOUNTER — Ambulatory Visit (INDEPENDENT_AMBULATORY_CARE_PROVIDER_SITE_OTHER): Payer: BLUE CROSS/BLUE SHIELD | Admitting: Endocrinology

## 2016-06-03 ENCOUNTER — Encounter: Payer: Self-pay | Admitting: Endocrinology

## 2016-06-03 VITALS — BP 92/60 | HR 80 | Temp 98.3°F | Resp 14 | Ht 68.0 in | Wt 190.6 lb

## 2016-06-03 DIAGNOSIS — E1165 Type 2 diabetes mellitus with hyperglycemia: Secondary | ICD-10-CM

## 2016-06-03 DIAGNOSIS — Z794 Long term (current) use of insulin: Secondary | ICD-10-CM | POA: Diagnosis not present

## 2016-06-03 NOTE — Progress Notes (Signed)
Patient ID: Jeffrey [name], male   DOB: February 02, 1956, 60 y.o.   MRN: [id number]            Reason for Appointment: for Type 2 Diabetes  Referring physician: [place]   History of Present Illness:          Date of diagnosis of type 2 diabetes mellitus: In his 44s       Background history:   He was treated with some oral agents in the first few years of diagnosis with diabetes but subsequently has been on insulin using various regimens Level of control previously is not known as he is new to the town and no previous records are available  Recent history:   INSULIN regimen is: 22 Lantus bid, pm dose 10 pm. Novolog 5 -7 units at meals    Non-insulin hypoglycemic drugs the patient is taking are: none  His last A1c was 9.4 and recent fructosamine is still high at 378  Current management, blood sugar patterns and problems identified:  He has been checking his blood sugar with a Contour meter and now able to better assess his blood sugar patterns.  He has seen the dietitian and he thinks that has been useful with improving his diet and getting more balanced meals per  Although he was told to continue Lantus 22 units twice a day he will adjust this periodically based on his blood sugar at the time of injection.  He claims that if he does not wait until 10:30 PM to take his Lantus if sugars will be high in the morning  FASTING blood sugars are overall reasonable but tends to fluctuate  Blood sugars are generally better midday and afternoon  HIGHEST blood sugars are after about 7 PM but he has only about 8 readings in the last month at night  He thinks he is taking 7 units at suppertime as directed for Novolog but not clear if he does this consistently; also admits that occasionally may miss his insulin causing blood sugars over 300 at night  No hypoglycemia, lowest reading was at about 1 PM which was 67  He has very limited activity level because of his dyspnea on exertion, prior to  retirement he was more active throughout the day     Compliance with the medical regimen: variable Hypoglycemia: Rarely   Glucose monitoring:  done 2-3 times a day         Glucometer: One Touch.      Blood Glucose readings by review of home meter with overall average 168,  was 228  Mean values apply above for all meters except median for One Touch  PRE-MEAL Fasting Lunch Afternoon  Bedtime Overall  Glucose range: 103-253  67-201  76-174  180-389    Mean/median: 158  120    168+/-75     Self-care:        Meal times are:  Breakfast is at 8 Lunch: Dinner: 7 pm   Typical meal intake: Breakfast is oatmeal  Lunch is sandwich , dinner is chicken or fish with pasta and salad               Dietician visit, most recent: 9/17               Exercise: a little  walking  Weight history:   Wt Readings from Last 3 Encounters:  06/03/16 190 lb 9.6 oz (86.5 kg)  05/22/16 189 lb 6.4 oz (85.9 kg)  05/21/16 192 lb (87.1 kg)  Glycemic control:   Lab Results  Component Value Date   HGBA1C 9.4 (H) 04/25/2016   Lab Results  Component Value Date   MICROALBUR <0.7 05/29/2016   LDLCALC 59 04/25/2016   CREATININE 1.13 05/29/2016   Lab Results  Component Value Date   MICRALBCREAT 1.0 05/29/2016         Medication List       Accurate as of 06/03/16  3:25 PM. Always use your most recent med list.          aspirin EC 81 MG tablet Take 81 mg by mouth daily.   atorvastatin 80 MG tablet Commonly known as:  LIPITOR Take 1 tablet (80 mg total) by mouth daily.   B-D INS SYR ULTRAFINE 1CC/31G 31G X 5/16" 1 ML Misc Generic drug:  Insulin Syringe-Needle U-100 USE 4 PER DAY TO INJECT INSULIN   BAYER CONTOUR NEXT TEST test strip Generic drug:  glucose blood USE AS INSTRUCTED TO CHECK BLOOD SUGAR 3 TIMES PER DAY DX CODE E11.65   carvedilol 12.5 MG tablet Commonly known as:  COREG Take 1 tablet (12.5 mg total) by mouth 2 (two) times daily with a meal.   cholecalciferol 1000 units  tablet Commonly known as:  VITAMIN D Take 2,000 Units by mouth daily.   eplerenone 25 MG tablet Commonly known as:  INSPRA Take 1 tablet (25 mg total) by mouth daily.   furosemide 40 MG tablet Commonly known as:  LASIX TAKE 1 TABLET EVERY DAY   insulin aspart 100 UNIT/ML injection Commonly known as:  novoLOG Inject 100 Units into the skin 3 (three) times daily before meals.   insulin glargine 100 UNIT/ML injection Commonly known as:  LANTUS Inject 1 mL (100 Units total) into the skin at bedtime.   metoCLOPramide 10 MG tablet Commonly known as:  REGLAN Take 1 tablet (10 mg total) by mouth 4 (four) times daily.   omeprazole 40 MG capsule Commonly known as:  PRILOSEC Take 1 capsule (40 mg total) by mouth 2 (two) times daily.   valsartan 80 MG tablet Commonly known as:  DIOVAN Take 1 tablet (80 mg total) by mouth daily.       Allergies:  Allergies  Allergen Reactions  . Codeine Nausea And Vomiting  . Penicillins Rash    Past Medical History:  Diagnosis Date  . Cardiac arrest ([place]) 05/22/2016  . CHF (congestive heart failure) ([place])   . Diabetes mellitus without complication ([place])   . Hyperlipidemia   . Hypertension   . Myocardial infarct 2105  . S/P primary angioplasty with coronary stent 05/22/2016    No past surgical history on file.  Family History  Problem Relation Age of Onset  . Alzheimer's disease Mother   . Heart disease Father   . Peripheral Artery Disease Father   . Heart disease Brother     Social History:  reports that he has never smoked. He has never used smokeless tobacco. He reports that he drinks about 0.6 oz of alcohol per week . He reports that he does not use drugs.   Review of Systems   ?  Hypothyroidism: He was told by his PCP to start levothyroxine but he was afraid of side effects and has not done so   Lipid history: On Lipitor with good control    Lab Results  Component Value Date   CHOL 113 (L) 04/25/2016   HDL 35 (L)  04/25/2016   LDLCALC 59 04/25/2016   TRIG 97 04/25/2016  CHOLHDL 3.2 04/25/2016           Hypertension: Present, has been recently controlled, also on carvedilol for history of CHF along with Diovan  Most recent eye exam was 2017, has had Laser Treatment for retinopathy  Most recent foot exam: 9/17   LABS:  Lab on 05/29/2016  Component Date Value Ref Range Status  . Sodium 05/29/2016 137  135 - 145 mEq/L Final  . Potassium 05/29/2016 4.9  3.5 - 5.1 mEq/L Final  . Chloride 05/29/2016 101  96 - 112 mEq/L Final  . CO2 05/29/2016 29  19 - 32 mEq/L Final  . Glucose, Bld 05/29/2016 222* 70 - 99 mg/dL Final  . BUN 05/29/2016 26* 6 - 23 mg/dL Final  . Creatinine, Ser 05/29/2016 1.13  0.40 - 1.50 mg/dL Final  . Calcium 05/29/2016 9.5  8.4 - 10.5 mg/dL Final  . GFR 05/29/2016 69.99  >60.00 mL/min Final  . Fructosamine 05/30/2016 378* 0 - 285 umol/L Final   Comment: Published reference interval for apparently healthy subjects between age 92 and 17 is 60 - 285 umol/L and in a poorly controlled diabetic population is 228 - 563 umol/L with a mean of 396 umol/L.   [name] Kitchen Microalb, Ur 05/29/2016 <0.7  0.0 - 1.9 mg/dL Final  . Creatinine,U 05/29/2016 67.9  mg/dL Final  . Microalb Creat Ratio 05/29/2016 1.0  0.0 - 30.0 mg/g Final    Physical Examination:         ASSESSMENT:  Diabetes, uncontrolled, Insulin-dependent And long-standing See history of present illness for detailed discussion of current diabetes management, blood sugar patterns and problems identified  He has had somewhat better blood sugars at home judging by his home average and also estimated A1c He probably has done better with his diet after talking to the dietitian He does appear to need the highest amount of insulin coverage at suppertime, still having occasional readings over 300 at night Compliance is improving but he has difficulty focusing on his diabetes consistently  ?  Hypothyroidism: He will have his labs  checked again by PCP, currently not on treatment  PLAN:    Continue Lantus 22 units twice a day but need to stop adjusting the dose based on blood sugars at the time of injection including at night.  Discussed that Lantus will be adjusted based on fasting blood sugar patterns on his monitor review and he probably cannot do this on his own  Take at least 7 units of Novolog at suppertime.  To cover day high readings after supper  If eating more carbohydrate at breakfast he will need to take 6-7 units of Novolog  Avoid skipping NovoLog before meals including in the morning regardless of blood sugar.  Consider Tyler Aas or Toujeo on the next visit but unlikely that he will get the same coverage for this  Patient Instructions  Take same dose of lantus, 22 units twice daily  NOVOLOG 7-8 UNITS at supper based on meal size   Jeffrey [name] 06/03/2016, 3:25 PM   Note: This office note was prepared with Estate agent. Any transcriptional errors that result from this process are unintentional.

## 2016-06-03 NOTE — Patient Instructions (Signed)
Take same dose of lantus, 22 units twice daily  NOVOLOG 7-8 UNITS at supper based on meal size

## 2016-06-09 ENCOUNTER — Other Ambulatory Visit: Payer: BLUE CROSS/BLUE SHIELD | Admitting: Internal Medicine

## 2016-06-12 ENCOUNTER — Ambulatory Visit: Payer: BLUE CROSS/BLUE SHIELD | Admitting: Internal Medicine

## 2016-07-28 ENCOUNTER — Other Ambulatory Visit: Payer: BLUE CROSS/BLUE SHIELD | Admitting: Internal Medicine

## 2016-07-29 ENCOUNTER — Ambulatory Visit: Payer: BLUE CROSS/BLUE SHIELD | Admitting: Internal Medicine

## 2016-08-05 ENCOUNTER — Other Ambulatory Visit: Payer: BLUE CROSS/BLUE SHIELD | Admitting: Internal Medicine

## 2016-08-05 DIAGNOSIS — E1165 Type 2 diabetes mellitus with hyperglycemia: Secondary | ICD-10-CM

## 2016-08-05 DIAGNOSIS — E118 Type 2 diabetes mellitus with unspecified complications: Principal | ICD-10-CM

## 2016-08-05 DIAGNOSIS — Z1329 Encounter for screening for other suspected endocrine disorder: Secondary | ICD-10-CM

## 2016-08-05 DIAGNOSIS — IMO0002 Reserved for concepts with insufficient information to code with codable children: Secondary | ICD-10-CM

## 2016-08-05 LAB — TSH: TSH: 2.5 mIU/L (ref 0.40–4.50)

## 2016-08-05 LAB — HEMOGLOBIN A1C
Hgb A1c MFr Bld: 7.6 % — ABNORMAL HIGH (ref <5.7)
Mean Plasma Glucose: 171 mg/dL

## 2016-08-08 ENCOUNTER — Ambulatory Visit (INDEPENDENT_AMBULATORY_CARE_PROVIDER_SITE_OTHER): Payer: BLUE CROSS/BLUE SHIELD | Admitting: Internal Medicine

## 2016-08-08 ENCOUNTER — Encounter: Payer: Self-pay | Admitting: Internal Medicine

## 2016-08-08 VITALS — BP 130/84 | HR 67 | Temp 98.4°F | Ht 68.0 in | Wt 195.0 lb

## 2016-08-08 DIAGNOSIS — Z794 Long term (current) use of insulin: Secondary | ICD-10-CM | POA: Diagnosis not present

## 2016-08-08 DIAGNOSIS — R946 Abnormal results of thyroid function studies: Secondary | ICD-10-CM

## 2016-08-08 DIAGNOSIS — I252 Old myocardial infarction: Secondary | ICD-10-CM

## 2016-08-08 DIAGNOSIS — E119 Type 2 diabetes mellitus without complications: Secondary | ICD-10-CM | POA: Diagnosis not present

## 2016-08-08 DIAGNOSIS — IMO0001 Reserved for inherently not codable concepts without codable children: Secondary | ICD-10-CM

## 2016-08-08 DIAGNOSIS — R7989 Other specified abnormal findings of blood chemistry: Secondary | ICD-10-CM

## 2016-08-08 MED ORDER — "INSULIN SYRINGE-NEEDLE U-100 31G X 5/16"" 0.3 ML MISC": 11 refills | Status: DC

## 2016-08-08 MED ORDER — "BD INSULIN SYR ULTRAFINE II 31G X 5/16"" 1 ML MISC": 11 refills | Status: DC

## 2016-08-08 NOTE — Patient Instructions (Signed)
We are pleased with hemoglobin A1c results. TSH is now within normal limits and we will continue to monitor every 6-12 months to see if stable. Continue same medications.

## 2016-08-08 NOTE — Progress Notes (Signed)
   Subjective:    Patient ID: Jeffrey [name], male    DOB: 03-05-1956, 60 y.o.   MRN: [id number]  HPI  60 year old Male for follow up on DM for follow up. He has been working on his diet diligently. His hemoglobin A1c has improved from 9.4% 3 months ago to 7.6% which is very good. He is also here for follow-up on TSH was elevated at 5.1-3 months ago and I thought he was developing hypothyroidism. His TSH is now 2.50. We will need to continue to watch this every 6-12 months it may be he will eventually develop hypothyroidism. He was alarmed by pamphlet he read about the side effects of Synthroid with heart disease. I assured him that if the dosage was adjusted correctly there should not be any harm in taking thyroid replacement. He has seen Dr. Dwyane Dee for endocrinology consultation.  He's had some issues obtaining  insulin syringes that he has used in the past. My Assistant will call the pharmacy and make sure he gets the type of syringes that he wants and the number that he needs per month.  He also has established with Dr. Oval Linsey, cardiologist who felt he was doing well from a cardiology standpoint.  Review of Systems     Objective:   Physical Exam Not examined. Labs reviewed with him        Assessment & Plan:  History of coronary artery disease Elevated TSH-TSH repeated recently is now within normal limits. Continue to monitor.  Controlled type 2 diabetes mellitus-hemoglobin A1c excellent. Continue same regimen

## 2016-08-12 ENCOUNTER — Other Ambulatory Visit: Payer: BLUE CROSS/BLUE SHIELD

## 2016-08-12 ENCOUNTER — Other Ambulatory Visit (INDEPENDENT_AMBULATORY_CARE_PROVIDER_SITE_OTHER): Payer: BLUE CROSS/BLUE SHIELD

## 2016-08-12 DIAGNOSIS — E1165 Type 2 diabetes mellitus with hyperglycemia: Secondary | ICD-10-CM | POA: Diagnosis not present

## 2016-08-12 DIAGNOSIS — Z794 Long term (current) use of insulin: Secondary | ICD-10-CM | POA: Diagnosis not present

## 2016-08-12 LAB — BASIC METABOLIC PANEL
BUN: 22 mg/dL (ref 6–23)
CO2: 30 mEq/L (ref 19–32)
Calcium: 9 mg/dL (ref 8.4–10.5)
Chloride: 103 mEq/L (ref 96–112)
Creatinine, Ser: 1.08 mg/dL (ref 0.40–1.50)
GFR: 73.7 mL/min (ref >60.00)
Glucose, Bld: 154 mg/dL — ABNORMAL HIGH (ref 70–99)
Potassium: 4.1 mEq/L (ref 3.5–5.1)
Sodium: 139 mEq/L (ref 135–145)

## 2016-08-12 LAB — HEMOGLOBIN A1C: Hgb A1c MFr Bld: 7.8 % — ABNORMAL HIGH (ref 4.6–6.5)

## 2016-08-14 ENCOUNTER — Ambulatory Visit: Payer: BLUE CROSS/BLUE SHIELD | Admitting: Endocrinology

## 2016-08-14 ENCOUNTER — Ambulatory Visit: Payer: BLUE CROSS/BLUE SHIELD | Admitting: Podiatry

## 2016-08-15 ENCOUNTER — Other Ambulatory Visit: Payer: BLUE CROSS/BLUE SHIELD

## 2016-08-15 ENCOUNTER — Encounter: Payer: Self-pay | Admitting: Endocrinology

## 2016-08-15 ENCOUNTER — Ambulatory Visit (INDEPENDENT_AMBULATORY_CARE_PROVIDER_SITE_OTHER): Payer: BLUE CROSS/BLUE SHIELD | Admitting: Endocrinology

## 2016-08-15 VITALS — BP 100/58 | HR 68 | Ht 68.11 in | Wt 191.6 lb

## 2016-08-15 DIAGNOSIS — I10 Essential (primary) hypertension: Secondary | ICD-10-CM

## 2016-08-15 DIAGNOSIS — E1165 Type 2 diabetes mellitus with hyperglycemia: Secondary | ICD-10-CM

## 2016-08-15 DIAGNOSIS — E1142 Type 2 diabetes mellitus with diabetic polyneuropathy: Secondary | ICD-10-CM | POA: Diagnosis not present

## 2016-08-15 DIAGNOSIS — Z794 Long term (current) use of insulin: Secondary | ICD-10-CM

## 2016-08-15 NOTE — Patient Instructions (Signed)
LANTUS 24 IN AM AND 22 IN PM  IF AM SUGAR GETS <90 THEN REDUCE PM LANTUS TO 18  LESS checks on waking up

## 2016-08-15 NOTE — Progress Notes (Signed)
Patient ID: Jeffrey [name], male   DOB: Mar 13, 1956, 61 y.o.   MRN: [id number]            Reason for Appointment: for Type 2 Diabetes  Referring physician: [place]   History of Present Illness:          Date of diagnosis of type 2 diabetes mellitus: In his 76s       Background history:   He was treated with some oral agents in the first few years of diagnosis with diabetes but subsequently has been on insulin using various regimens Level of control previously is not known as he is new to the town and no previous records are available  Recent history:   INSULIN regimen is: 22 Lantus bid, pm dose 10 pm. Novolog 5-5-7 units at meals    Non-insulin hypoglycemic drugs the patient is taking are: none  His baseline A1c was 9.4 and recent A1c is down to 7.8  Current management, blood sugar patterns and problems identified:  He has had some fluctuation in his blood sugars but overall on an average they are better.  Compliance with mealtime insulin is improving  Blood sugars appear to be most inconsistent midday  Also has variable readings after supper  Appears to have mostly high readings BEFORE dinnertime averaging about 190  FASTING readings are somewhat variable but not clear which readings are before or after eating in the morning  Has had only one low normal blood sugar midday  Readings AFTER his evening meal at night are better than before with using 7 Humalog instead of 5  He is not able to know how to adjust his mealtime doses based on what he is eating.  He does not want to change his insulin regimens yet because he has large supply of both Lantus and NovoLog  He has very limited activity level because of his dyspnea on exertion, prior to retirement he was more active throughout the day  His weight fluctuates based on his fluid status but overall not much different     Compliance with the medical regimen: Improving Hypoglycemia: Rarely   Glucose monitoring:  done  2-3 times a day         Glucometer:  Contour Blood Glucose readings by download  Mean values apply above for all meters except median for One Touch  PRE-MEAL Fasting Midday  Dinner Bedtime Overall  Glucose range: 96-201  64-251  163-217  83-236    Mean/median: 160     168   POST-MEAL PC Breakfast PC Lunch PC Dinner  Glucose range:  123-235    Mean/median:        Self-care:        Meal times are:  Breakfast is at 8 Lunch: Dinner: 7 pm   Typical meal intake: Breakfast is oatmeal  Lunch is sandwich , dinner is chicken or fish with pasta and salad               Dietician visit, most recent: 9/17               Exercise: a little  walking  Weight history:   Wt Readings from Last 3 Encounters:  08/15/16 191 lb 9.6 oz (86.9 kg)  08/08/16 195 lb (88.5 kg)  06/03/16 190 lb 9.6 oz (86.5 kg)    Glycemic control:   Lab Results  Component Value Date   HGBA1C 7.8 (H) 08/12/2016   HGBA1C 7.6 (H) 08/05/2016   HGBA1C 9.4 (H) 04/25/2016  Lab Results  Component Value Date   MICROALBUR <0.7 05/29/2016   LDLCALC 59 04/25/2016   CREATININE 1.08 08/12/2016   Lab Results  Component Value Date   MICRALBCREAT 1.0 05/29/2016       Allergies as of 08/15/2016      Reactions   Codeine Nausea And Vomiting   Penicillins Rash      Medication List       Accurate as of 08/15/16 11:59 PM. Always use your most recent med list.          aspirin EC 81 MG tablet Take 81 mg by mouth daily.   atorvastatin 80 MG tablet Commonly known as:  LIPITOR Take 1 tablet (80 mg total) by mouth daily.   BAYER CONTOUR NEXT TEST test strip Generic drug:  glucose blood USE AS INSTRUCTED TO CHECK BLOOD SUGAR 3 TIMES PER DAY DX CODE E11.65   carvedilol 12.5 MG tablet Commonly known as:  COREG Take 1 tablet (12.5 mg total) by mouth 2 (two) times daily with a meal.   cholecalciferol 1000 units tablet Commonly known as:  VITAMIN D Take 2,000 Units by mouth daily.   eplerenone 25 MG  tablet Commonly known as:  INSPRA Take 1 tablet (25 mg total) by mouth daily.   furosemide 40 MG tablet Commonly known as:  LASIX TAKE 1 TABLET EVERY DAY   insulin aspart 100 UNIT/ML injection Commonly known as:  novoLOG Inject 100 Units into the skin 3 (three) times daily before meals.   insulin glargine 100 UNIT/ML injection Commonly known as:  LANTUS Inject 1 mL (100 Units total) into the skin at bedtime.   Insulin Syringe-Needle U-100 31G X 5/16" 0.3 ML Misc Commonly known as:  B-D INS SYR HALF-UNIT .3CC/31G Use 6 times a day   metoCLOPramide 10 MG tablet Commonly known as:  REGLAN Take 1 tablet (10 mg total) by mouth 4 (four) times daily.   omeprazole 40 MG capsule Commonly known as:  PRILOSEC Take 1 capsule (40 mg total) by mouth 2 (two) times daily.   valsartan 80 MG tablet Commonly known as:  DIOVAN Take 1 tablet (80 mg total) by mouth daily.       Allergies:  Allergies  Allergen Reactions  . Codeine Nausea And Vomiting  . Penicillins Rash    Past Medical History:  Diagnosis Date  . Cardiac arrest ([place]) 05/22/2016  . CHF (congestive heart failure) ([place])   . Diabetes mellitus without complication ([place])   . Hyperlipidemia   . Hypertension   . Myocardial infarct 2105  . S/P primary angioplasty with coronary stent 05/22/2016    No past surgical history on file.  Family History  Problem Relation Age of Onset  . Alzheimer's disease Mother   . Heart disease Father   . Peripheral Artery Disease Father   . Heart disease Brother     Social History:  reports that he has never smoked. He has never used smokeless tobacco. He reports that he drinks about 0.6 oz of alcohol per week . He reports that he does not use drugs.   Review of Systems   ?  Hypothyroidism: He was told by his PCP to start levothyroxine but he was afraid of side effects and has not done so  Lab Results  Component Value Date   TSH 2.50 08/05/2016   TSH 5.12 (H) 04/25/2016      Lipid history: On Lipitor with good control    Lab Results  Component Value Date  CHOL 113 (L) 04/25/2016   HDL 35 (L) 04/25/2016   LDLCALC 59 04/25/2016   TRIG 97 04/25/2016   CHOLHDL 3.2 04/25/2016           Hypertension: Present, has been recently controlled, also on carvedilol for history of CHF along with Diovan  Most recent eye exam was 2017, has had Laser Treatment for retinopathy  Most recent foot exam: 9/17   LABS:  Lab on 08/12/2016  Component Date Value Ref Range Status  . Hgb A1c MFr Bld 08/12/2016 7.8* 4.6 - 6.5 % Final  . Sodium 08/12/2016 139  135 - 145 mEq/L Final  . Potassium 08/12/2016 4.1  3.5 - 5.1 mEq/L Final  . Chloride 08/12/2016 103  96 - 112 mEq/L Final  . CO2 08/12/2016 30  19 - 32 mEq/L Final  . Glucose, Bld 08/12/2016 154* 70 - 99 mg/dL Final  . BUN 08/12/2016 22  6 - 23 mg/dL Final  . Creatinine, Ser 08/12/2016 1.08  0.40 - 1.50 mg/dL Final  . Calcium 08/12/2016 9.0  8.4 - 10.5 mg/dL Final  . GFR 08/12/2016 73.70  >60.00 mL/min Final    Physical Examination:         ASSESSMENT:  Diabetes, uncontrolled, Insulin-dependent And long-standing See history of present illness for detailed discussion of current diabetes management, blood sugar patterns and problems identified  He has had somewhat better blood sugars at home judging by his Improved A1c which was previously over 9% However still is not at his target of 7% He probably has some fluctuation based on his diet, activity level use of Lantus as a basal insulin and not able to adjust his mealtime doses based on what he is eating Most likely needs more Lantus in the morning since blood sugars are mostly higher before supper time He does need more diabetes education with the diabetes educator but does not want to  do this yet    Hypothyroidism: This is ruled out with normal TSH recently, had transiently higher reading before  PLAN:    Increase morning Lantus by at least 2 units  for now  May need to reduce evening Lantus if fasting readings are consistently low normal  Consider Tresiba or Toujeo when he finishes Lantus  Also discussed in detail the option of using the V-go pump and how it would be used.  He is somewhat interested in this and will look into the cost of this and called when he wants to schedule  To have at least 7-8 units of Novolog at suppertime based on his meal size  He can check blood sugars less often in the morning and more after meals compared to what he is doing now  Also when able to may be able to benefit from using the freestyle [place] system  Patient Instructions  LANTUS 24 IN AM AND 22 IN PM  IF AM SUGAR GETS <90 THEN REDUCE PM LANTUS TO 18  LESS checks on waking up  Counseling time on subjects discussed above is over 50% of today's 25 minute visit  Johnasia Liese 08/17/2016, 10:48 AM   Note: This office note was prepared with Dragon voice recognition system technology. Any transcriptional errors that result from this process are unintentional.

## 2016-08-17 DIAGNOSIS — E1142 Type 2 diabetes mellitus with diabetic polyneuropathy: Secondary | ICD-10-CM | POA: Insufficient documentation

## 2016-08-18 ENCOUNTER — Telehealth: Payer: Self-pay | Admitting: Internal Medicine

## 2016-08-18 MED ORDER — AZITHROMYCIN 250 MG PO TABS: ORAL_TABLET | ORAL | 0 refills | Status: DC

## 2016-08-18 MED ORDER — BENZONATATE 100 MG PO CAPS: 100.0000 mg | ORAL_CAPSULE | Freq: Three times a day (TID) | ORAL | 0 refills | Status: DC

## 2016-08-18 NOTE — Telephone Encounter (Signed)
Zpak sent to pharmacy.  Patient aware.

## 2016-08-18 NOTE — Telephone Encounter (Signed)
Advised patient tessalon rx was sent in.

## 2016-08-18 NOTE — Addendum Note (Signed)
Addended by: Elby Showers on: 08/18/2016 02:19 PM   Modules accepted: Orders

## 2016-08-18 NOTE — Telephone Encounter (Signed)
Calling Zithromax Z-PAK

## 2016-08-18 NOTE — Telephone Encounter (Signed)
Brother, Jeffrey [name] is calling states that the patient has come down with the same sickness that he had.  You advised that he would likely get this and would need the same medication.  He is calling to ask if you will call this is for the patient.    Pharmacy:  [facility] [place] # for contact:  [phone]

## 2016-08-18 NOTE — Telephone Encounter (Signed)
Bill, patient's brother is calling back.  States that he was hoping that the patient would be able to have the [facility] for the cough as well.  States that he is coughing at night and he feels that he needs something to provide relief.   He said he had the [place] so he just wanted [place] to have the same thing he had because he has the same symptoms that he had.    [facility] # for contact:  [phone]

## 2016-08-18 NOTE — Telephone Encounter (Signed)
Represcribed by e chart

## 2016-08-18 NOTE — Telephone Encounter (Signed)
Please call this in - came up as error

## 2016-08-18 NOTE — Telephone Encounter (Signed)
Calling Tessalon Perles 100 mg 3 times daily #30 no refill

## 2016-08-21 ENCOUNTER — Telehealth: Payer: Self-pay | Admitting: Internal Medicine

## 2016-08-21 NOTE — Telephone Encounter (Signed)
Patient brother, Gwyndolyn Saxon is calling; advised that patient is not feeling 100%.  Patient has one more day on his Z-pak to finish.  Advised that he needs to finish that antibiotic before we do anything further.  And, advised that the Z-pak stays in his body 11 days.  Advised that he should stay well hydrated, get plenty of rest.  Patient is concerned that he may have pneumonia because there are reports of pneumonia in their building.  Advised that his best defense to fighting pneumonia is to stay contained and use good hand washing.  Patient's brother  advised that he is not running any fever, he is not congested, he just isn't feeling better and wants to make sure he isn't getting pneumonia.  Advised the brother that he should wait for the entire 11 days and call us back at the end of next week if he still is not feeling 100%.  He wants you to listen to his lungs at that time.    Please advise if there is anything that I need to tell this patient or if you want to see this patient before the end of next week?

## 2016-08-21 NOTE — Telephone Encounter (Addendum)
Spoke to pt. See Friday.  Addendum 08/22/2016. His brother called saying Zam was still not well. He still has more Z-Pak tablets to complete. He is in bed. Does not want to come to office today. Brother says patient has not been checking his glucose.

## 2016-08-22 ENCOUNTER — Encounter: Payer: Self-pay | Admitting: Internal Medicine

## 2016-08-25 ENCOUNTER — Ambulatory Visit: Payer: BLUE CROSS/BLUE SHIELD | Admitting: Podiatry

## 2016-08-25 ENCOUNTER — Encounter: Payer: Self-pay | Admitting: Podiatry

## 2016-08-25 DIAGNOSIS — M79674 Pain in right toe(s): Secondary | ICD-10-CM | POA: Diagnosis not present

## 2016-08-25 DIAGNOSIS — E119 Type 2 diabetes mellitus without complications: Secondary | ICD-10-CM

## 2016-08-25 DIAGNOSIS — E1149 Type 2 diabetes mellitus with other diabetic neurological complication: Secondary | ICD-10-CM

## 2016-08-25 DIAGNOSIS — B351 Tinea unguium: Secondary | ICD-10-CM

## 2016-08-25 DIAGNOSIS — M79675 Pain in left toe(s): Secondary | ICD-10-CM | POA: Diagnosis not present

## 2016-08-25 NOTE — Patient Instructions (Signed)

## 2016-08-26 NOTE — Progress Notes (Signed)
Subjective:     Patient ID: Jeffrey [name], male   DOB: 02-19-1956, 61 y.o.   MRN: [id number]  HPI 61 year old male presents to the office today for concerns of thick, painful, elongated toenails that he cannot trim himself as well as for diabetic foot evaluation. He states he would also like to proceed with laser for treatment of his toenails. He has tried multiple other treatments and he would like laser at this time. His last A1c was 7.8. Denies any claudication symptoms. Denies any open sores. He has no other complaints today.   Review of Systems  All other systems reviewed and are negative.      Objective:   Physical Exam General: AAO x3, NAD  Dermatological: Nails are hypertrophic, dystrophic, brittle, discolored, elongated 7. No surrounding redness or drainage. Tenderness nails 1-5  On the right and 1 and 2 on the left. . No open lesions or pre-ulcerative lesions are identified today.  Vascular: Dorsalis Pedis artery and Posterior Tibial artery pedal pulses are 2/4 bilateral with immedate capillary fill time. Pedal hair growth present.There is no pain with calf compression, swelling, warmth, erythema.   Neruologic: Sensation decreased with SWMF.   Musculoskeletal: No gross boney pedal deformities bilateral. No pain, crepitus, or limitation noted with foot and ankle range of motion bilateral. Muscular strength 5/5 in all groups tested bilateral.  Gait: Unassisted, Nonantalgic.          Assessment:     61 year old male with symptomatic onychomycosis    Plan:     -Treatment options discussed including all alternatives, risks, and complications -Etiology of symptoms were discussed -Nails debrided 7 without complications or bleeding. -Laser was discussed with the patient. After a discussion he wishes to go ahead and proceed with laser. No guarantees were given. He did not want to start with a biopsy and he wanted to just go ahead and start.  -Laserering of Toenails was  carried out at today's visit via Q-switch YAG laser by [facility] at continuous on the 7 digit toenails.  Patient and staff were wearing appropriate laser protective goggles/eyewear Laser device was tested prior to use and safety protocols were followed Frequency of 5 Hz, Level 4, Joules 1.3 delivered.  The patient tolerated the lasering well without any complications. They were encouraged to call the office with any questions, concerns, change in symptoms. -Daily foot inspection -Follow-up in 1 month for his second laser treatment. or sooner if any problems arise. In the meantime, encouraged to call the office with any questions, concerns, change in symptoms.   Celesta Gentile, DPM

## 2016-09-03 ENCOUNTER — Telehealth: Payer: Self-pay | Admitting: *Deleted

## 2016-09-03 NOTE — Telephone Encounter (Signed)
Called patient and stated that we were sorry about the mix up on the compound cream order and that we would get it taken care of today. Jeffrey [name]

## 2016-09-04 ENCOUNTER — Telehealth: Payer: Self-pay | Admitting: *Deleted

## 2016-09-20 ENCOUNTER — Other Ambulatory Visit: Payer: Self-pay | Admitting: Endocrinology

## 2016-09-22 ENCOUNTER — Other Ambulatory Visit: Payer: BLUE CROSS/BLUE SHIELD

## 2016-09-23 ENCOUNTER — Ambulatory Visit: Payer: BLUE CROSS/BLUE SHIELD

## 2016-09-23 DIAGNOSIS — B351 Tinea unguium: Secondary | ICD-10-CM

## 2016-09-23 NOTE — Progress Notes (Signed)
Pt presents with mycotic infection of nails 1-5 bilateral  All other systems are negative  Laser therapy administered to affected nails and tolerated well. All safety precautions were in place. Re-appointed in 4 weeks for 3rd treatment 

## 2016-10-21 ENCOUNTER — Ambulatory Visit: Payer: Self-pay | Admitting: Podiatry

## 2016-10-21 DIAGNOSIS — B351 Tinea unguium: Secondary | ICD-10-CM

## 2016-10-24 NOTE — Progress Notes (Signed)
Pt presents with mycotic infection of nails 1-5 bilateral  All other systems are negative  Laser therapy administered to affected nails and tolerated well. All safety precautions were in place. Re-appointed in 4 weeks for 3rd treatment 

## 2016-11-10 ENCOUNTER — Other Ambulatory Visit (INDEPENDENT_AMBULATORY_CARE_PROVIDER_SITE_OTHER): Payer: BLUE CROSS/BLUE SHIELD

## 2016-11-10 DIAGNOSIS — E1165 Type 2 diabetes mellitus with hyperglycemia: Secondary | ICD-10-CM | POA: Diagnosis not present

## 2016-11-10 DIAGNOSIS — Z794 Long term (current) use of insulin: Secondary | ICD-10-CM

## 2016-11-10 LAB — COMPREHENSIVE METABOLIC PANEL
ALT: 24 U/L (ref 0–53)
AST: 21 U/L (ref 0–37)
Albumin: 4 g/dL (ref 3.5–5.2)
Alkaline Phosphatase: 57 U/L (ref 39–117)
BUN: 23 mg/dL (ref 6–23)
CO2: 31 mEq/L (ref 19–32)
Calcium: 9.3 mg/dL (ref 8.4–10.5)
Chloride: 103 mEq/L (ref 96–112)
Creatinine, Ser: 1 mg/dL (ref 0.40–1.50)
GFR: 80.48 mL/min (ref >60.00)
Glucose, Bld: 143 mg/dL — ABNORMAL HIGH (ref 70–99)
Potassium: 4.4 mEq/L (ref 3.5–5.1)
Sodium: 138 mEq/L (ref 135–145)
Total Bilirubin: 0.5 mg/dL (ref 0.2–1.2)
Total Protein: 6.9 g/dL (ref 6.0–8.3)

## 2016-11-10 LAB — HEMOGLOBIN A1C: Hgb A1c MFr Bld: 8.2 % — ABNORMAL HIGH (ref 4.6–6.5)

## 2016-11-13 ENCOUNTER — Other Ambulatory Visit: Payer: BLUE CROSS/BLUE SHIELD

## 2016-11-13 ENCOUNTER — Ambulatory Visit: Payer: BLUE CROSS/BLUE SHIELD | Admitting: Endocrinology

## 2016-11-17 NOTE — Progress Notes (Signed)
Cardiology Office Note   Date:  11/18/2016   ID:  Jeffrey [name], DOB November 12, 1955, MRN [id number]  PCP:  Elby Showers, MD  Cardiologist:   Skeet Latch, MD   Chief Complaint  Patient presents with  . Follow-up    6 month;  [name] Kitchen Shortness of Breath    occasionally.  . Dizziness    occasionally.  . Fatigue    frequently.      History of Present Illness: Jeffrey [name] is a 61 y.o. male with hypertension, hyperlipidemia, diabetes and peripheral neuropathy who presents for follow up.  Jeffrey [name] recently moved to [facility] from [place].  He moved here with his identical twin brother.  Jeffrey [name] had a cardiac arrest 11/23/13 and underwent PCI of the LAD.  Echo 10/2015 revealed an anteroseptal and apical septal infarct with associated hypokinesis.  LVEF was 55%.  He had a PET CT/myocardial perfusion scan 10/2015 that revealed a moderate sized, moderate intensity antero-apical infarct with mild ischemia.  At the end of 2016 he had an episode of stabbing chest pain.  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  At that time his clopidogrel was discontinued and he underwent several laser procedures.  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    Since his last appointment Jeffrey [name] has not been doing well. He was previously exercising 3 times per week. Lately he feels very short of breath with minimal exertion. He has not been leaving his apartment much. When he tries to exert himself his arms feel likes he is carrying cement. He also has no ambition to exercise. He endorses lower extremity edema but denies orthopnea or PND. He continues to sleep on 2 pillows ever since his eye surgery. He denies chest pain but does have some chest tightness with exertion.  He doesn't have energy to get dressed most days. He denies feeling depressed    Past Medical History:  Diagnosis  Date  . Cardiac arrest ([place]) 05/22/2016  . CHF (congestive heart failure) ([place])   . Diabetes mellitus without complication ([place])   . Hyperlipidemia   . Hypertension   . Myocardial infarct 2105  . S/P primary angioplasty with coronary stent 05/22/2016    No past surgical history on file.   Current Outpatient Prescriptions  Medication Sig Dispense Refill  . aspirin EC 81 MG tablet Take 81 mg by mouth daily.    [name] Kitchen atorvastatin (LIPITOR) 80 MG tablet Take 1 tablet (80 mg total) by mouth daily. 30 tablet 5  . azithromycin (ZITHROMAX) 250 MG tablet Taper dose 6 tablet 0  . BAYER CONTOUR NEXT TEST test strip USE AS INSTRUCTED TO CHECK BLOOD SUGAR 3 TIMES PER DAY DX CODE E11.65  3  . BAYER CONTOUR NEXT TEST test strip USE AS INSTRUCTED TO CHECK BLOOD SUGAR 4 TIMES PER DAY DX CODE E11.65 100 each 3  . benzonatate (TESSALON) 100 MG capsule Take 1 capsule (100 mg total) by mouth 3 (three) times daily. 30 capsule 0  . carvedilol (COREG) 12.5 MG tablet Take 1 tablet (12.5 mg total) by mouth 2 (two) times daily with a meal. 60 tablet 5  . cholecalciferol (VITAMIN D) 1000 units tablet Take 2,000 Units by mouth daily.    [name] Kitchen eplerenone (INSPRA) 25 MG tablet Take 1 tablet (25 mg total) by mouth daily. 30 tablet 5  .  furosemide (LASIX) 40 MG tablet TAKE 1 TABLET EVERY DAY 30 tablet 5  . insulin aspart (NOVOLOG) 100 UNIT/ML injection Inject 100 Units into the skin 3 (three) times daily before meals. (Patient taking differently: Inject 5 Units into the skin 3 (three) times daily with meals. ) 30 mL 5  . insulin glargine (LANTUS) 100 UNIT/ML injection Inject 1 mL (100 Units total) into the skin at bedtime. (Patient taking differently: Inject 20-24 Units into the skin 2 (two) times daily. ) 30 mL 5  . Insulin Syringe-Needle U-100 (B-D INS SYR HALF-UNIT .3CC/31G) 31G X 5/16" 0.3 ML MISC Use 6 times a day 200 each 11  . metoCLOPramide (REGLAN) 10 MG tablet Take 1 tablet (10 mg total) by mouth 4 (four) times daily.  120 tablet 5  . omeprazole (PRILOSEC) 40 MG capsule Take 1 capsule (40 mg total) by mouth 2 (two) times daily. 60 capsule 5  . valsartan (DIOVAN) 80 MG tablet Take 1 tablet (80 mg total) by mouth daily. 30 tablet 5   No current facility-administered medications for this visit.     Allergies:   Codeine and Penicillins    Social History:  The patient  reports that he has never smoked. He has never used smokeless tobacco. He reports that he drinks about 0.6 oz of alcohol per week . He reports that he does not use drugs.   Family History:  The patient's family history includes Alzheimer's disease in his mother; Heart disease in his brother and father; Peripheral Artery Disease in his father.    ROS:  Please see the history of present illness.   Otherwise, review of systems are positive for cold hands.   All other systems are reviewed and negative.    PHYSICAL EXAM: VS:  BP 134/75   Pulse 64   Ht 5\' 8"  (1.727 m)   Wt 86.3 kg (190 lb 3.2 oz)   SpO2 95%   BMI 28.92 kg/m  , BMI Body mass index is 28.92 kg/m. GENERAL:   Appears fatigued. HEENT:  Pupils equal round and reactive, fundi not visualized, oral mucosa unremarkable NECK:  No jugular venous distention, waveform within normal limits, carotid upstroke brisk and symmetric, no bruits LYMPHATICS:  No cervical adenopathy LUNGS:  Clear to auscultation bilaterally HEART:  RRR.  PMI not displaced or sustained,S1 and S2 within normal limits, no S3, no S4, no clicks, no rubs, no  murmurs ABD:  Flat, positive bowel sounds normal in frequency in pitch, no bruits, no rebound, no guarding, no midline pulsatile mass, no hepatomegaly, no splenomegaly EXT:  2 plus pulses throughout, no edema, no cyanosis no clubbing SKIN:  No rashes no nodules NEURO:  Cranial nerves II through XII grossly intact, motor grossly intact throughout PSYCH:  Cognitively intact, oriented to person place and time   EKG:  EKG is ordered today. The ekg ordered 05/22/16  demonstrates sinus rhythm rate 63 bpm.  Prior anteroseptal infarct.  LAFB 11/18/16: Sinus rhythm.  Rate 64 bpm.  Low voltage limb and precordial leads  Recent Labs: 04/25/2016: Hemoglobin 14.2; Platelets 363 08/05/2016: TSH 2.50 11/10/2016: ALT 24; BUN 23; Creatinine, Ser 1.00; Potassium 4.4; Sodium 138    Lipid Panel    Component Value Date/Time   CHOL 113 (L) 04/25/2016 1156   TRIG 97 04/25/2016 1156   HDL 35 (L) 04/25/2016 1156   CHOLHDL 3.2 04/25/2016 1156   VLDL 19 04/25/2016 1156   LDLCALC 59 04/25/2016 1156    09/07/15: Sodium 138, potassium 4.5,  BUN 20, creatinine 1.02 AST 17, ALT 24 Total cholesterol 113, triglycerides 146, HDL 32, LDL 52 TSH 1.76 WBC 11.7, hemoglobin 13.4, hematocrit 40.2, platelets 317 Hemoglobin A1c 8.9 on a relatively dry%  Wt Readings from Last 3 Encounters:  11/18/16 86.3 kg (190 lb 3.2 oz)  08/15/16 86.9 kg (191 lb 9.6 oz)  08/08/16 88.5 kg (195 lb)      ASSESSMENT AND PLAN:  # CAD s/p cardiac arrest and LAD PCI: Mr. Zwahlen reports chest pressure and shortness of breath.  We will get an echo and Lexiscan Myoview.  He reports edema but has none on exam. Continue aspirin, atorvastatin and carvedilol.  # Hypertension:  Blood pressure is well-controlled on carvedilol and valsartan.    # Hyperlipidemia.  LDL 59 on 04/2016.  Continue atorvastatin.  He will repeat lipids with his PCP.  Goal LDL is <70.   Current medicines are reviewed at length with the patient today.  The patient does not have concerns regarding medicines.  The following changes have been made:  no change  Labs/ tests ordered today include:   Orders Placed This Encounter  Procedures  . Myocardial Perfusion Imaging  . EKG 12-Lead  . ECHOCARDIOGRAM COMPLETE     Disposition:   FU with Jeffrey Demeyer C. Oval Linsey, MD, [facility] in 1 month.  Time spent: 40 minutes-Greater than 50% of this time was spent in counseling, explanation of diagnosis, planning of further management, and  coordination of care.    This note was written with the assistance of speech recognition software.  Please excuse any transcriptional errors.  Signed, Danney Bungert C. Oval Linsey, MD, [facility]  11/18/2016 6:02 PM    [facility]

## 2016-11-18 ENCOUNTER — Ambulatory Visit (INDEPENDENT_AMBULATORY_CARE_PROVIDER_SITE_OTHER): Payer: BLUE CROSS/BLUE SHIELD | Admitting: Cardiovascular Disease

## 2016-11-18 ENCOUNTER — Encounter: Payer: Self-pay | Admitting: Cardiovascular Disease

## 2016-11-18 ENCOUNTER — Ambulatory Visit: Payer: BLUE CROSS/BLUE SHIELD | Admitting: Podiatry

## 2016-11-18 VITALS — BP 134/75 | HR 64 | Ht 68.0 in | Wt 190.2 lb

## 2016-11-18 DIAGNOSIS — B351 Tinea unguium: Secondary | ICD-10-CM

## 2016-11-18 DIAGNOSIS — R0602 Shortness of breath: Secondary | ICD-10-CM

## 2016-11-18 DIAGNOSIS — R079 Chest pain, unspecified: Secondary | ICD-10-CM | POA: Diagnosis not present

## 2016-11-18 DIAGNOSIS — I10 Essential (primary) hypertension: Secondary | ICD-10-CM

## 2016-11-18 DIAGNOSIS — Z955 Presence of coronary angioplasty implant and graft: Secondary | ICD-10-CM | POA: Diagnosis not present

## 2016-11-18 NOTE — Patient Instructions (Addendum)
Medication Instructions:  Your physician recommends that you continue on your current medications as directed. Please refer to the Current Medication list given to you today.  Labwork: none  Testing/Procedures: Your physician has requested that you have an echocardiogram. Echocardiography is a painless test that uses sound waves to create images of your heart. It provides your doctor with information about the size and shape of your heart and how well your heart's chambers and valves are working. This procedure takes approximately one hour. There are no restrictions for this procedure. [place] [address]  Your physician has requested that you have a lexiscan myoview. For further information please visit [email or web address]. Please follow instruction sheet, as given.  Follow-Up: Your physician recommends that you schedule a follow-up appointment in: 1 month ov  If you need a refill on your cardiac medications before your next appointment, please call your pharmacy.

## 2016-11-20 ENCOUNTER — Telehealth: Payer: Self-pay | Admitting: Cardiovascular Disease

## 2016-11-20 NOTE — Telephone Encounter (Signed)
Had spoken to the patient earlier. He does not want to speak to a nurse but only Dr. Oval Linsey. Will route to her.

## 2016-11-20 NOTE — Telephone Encounter (Signed)
New message      Pt request to talk to the doctor about upcoming test scheduled.  He has questions and request the doctor only to call

## 2016-11-20 NOTE — Telephone Encounter (Signed)
Returned the phone call to the patient. He stated that he would rather not speak with a nurse but only wanted to speak to Dr. Oval Linsey. The patient did verbalize that the cost for the two tests that were ordered will be expensive for him at this time. He would like to speak to Dr. Oval Linsey in more detail.

## 2016-11-20 NOTE — Telephone Encounter (Signed)
New Message     Pt cancelled both echo and vasc test, he can not afford to do these test at this time.   Please call when you get the chance .

## 2016-11-21 ENCOUNTER — Telehealth: Payer: Self-pay | Admitting: Cardiovascular Disease

## 2016-11-21 NOTE — Progress Notes (Signed)
Pt presents with mycotic infection of nails 1-5 bilateral  All other systems are negative  Laser therapy administered to affected nails and tolerated well. All safety precautions were in place. Re-appointed in 4 weeks for 4th treatment 

## 2016-11-21 NOTE — Telephone Encounter (Signed)
New message     Pt home phone is out , please call on cell phone

## 2016-11-21 NOTE — Telephone Encounter (Signed)
Attempted to call patient twice at  [phone].  Call went directly to voicemail, which is not accepting messages.  Kalub Morillo C. Oval Linsey, MD, [facility]  11/21/2016 10:09 AM

## 2016-11-24 ENCOUNTER — Other Ambulatory Visit: Payer: Self-pay | Admitting: Internal Medicine

## 2016-11-24 NOTE — Telephone Encounter (Signed)
Refill all x one year

## 2016-11-25 ENCOUNTER — Telehealth: Payer: Self-pay | Admitting: Cardiovascular Disease

## 2016-11-25 ENCOUNTER — Other Ambulatory Visit (HOSPITAL_COMMUNITY): Payer: BLUE CROSS/BLUE SHIELD

## 2016-11-25 NOTE — Telephone Encounter (Signed)
Dr Oval Linsey, According to the Cancelled appt for Echo and Myoview the Patient has declined to have the testing done. I have removed the orders from the workque.   Thank you  sonya

## 2016-11-25 NOTE — Telephone Encounter (Signed)
Spoke with patient on 11/21/16.  He thinks his symptoms are stable and does not want to undergo additional testing at this time, mostly due to cost constraints.

## 2016-11-26 ENCOUNTER — Other Ambulatory Visit: Payer: Self-pay | Admitting: Internal Medicine

## 2016-11-26 NOTE — Telephone Encounter (Signed)
Echo and myoview have been cancelled

## 2016-11-26 NOTE — Telephone Encounter (Signed)
Skeet Latch, MD   11:38 PM  Note    Spoke with patient on 11/21/16.  He thinks his symptoms are stable and does not want to undergo additional testing at this time, mostly due to cost constraints.

## 2016-11-26 NOTE — Telephone Encounter (Signed)
Please call pharmacy looks like was refilled 2 days ago?

## 2016-11-27 ENCOUNTER — Telehealth: Payer: Self-pay | Admitting: Cardiovascular Disease

## 2016-11-27 ENCOUNTER — Ambulatory Visit: Payer: BLUE CROSS/BLUE SHIELD | Admitting: Endocrinology

## 2016-11-27 ENCOUNTER — Encounter: Payer: Self-pay | Admitting: Endocrinology

## 2016-11-27 ENCOUNTER — Ambulatory Visit (INDEPENDENT_AMBULATORY_CARE_PROVIDER_SITE_OTHER): Payer: BLUE CROSS/BLUE SHIELD | Admitting: Endocrinology

## 2016-11-27 VITALS — BP 130/68 | HR 71 | Ht 69.0 in | Wt 192.0 lb

## 2016-11-27 DIAGNOSIS — Z794 Long term (current) use of insulin: Secondary | ICD-10-CM | POA: Diagnosis not present

## 2016-11-27 DIAGNOSIS — E1165 Type 2 diabetes mellitus with hyperglycemia: Secondary | ICD-10-CM | POA: Diagnosis not present

## 2016-11-27 NOTE — Telephone Encounter (Signed)
Patient calling states that he was informed that our office would notify him when Dr. Oval Linsey would like him to come back in. Please call to discuss, thanks.

## 2016-11-27 NOTE — Telephone Encounter (Signed)
Returned call to patient. He had upcoming stress test and echo, which were cancelled/"put on hold". Pt states he talked to Dr. Oval Linsey the other day and this was advised/OK. Appt for f/u discussion was also cancelled.  He would like to know, if he is not getting these tests, when he is recommended to f/u in our office again.  Pt aware I will seek Dr. Blenda Mounts recommendation.

## 2016-11-27 NOTE — Progress Notes (Signed)
Patient ID: Jeffrey [name], male   DOB: [birth year], 61 y.o.   MRN: [id number]            Reason for Appointment: Follow-up for Type 2 Diabetes  Referring physician: [place]   History of Present Illness:          Date of diagnosis of type 2 diabetes mellitus: In his 68s       Background history:   He was treated with some oral agents in the first few years of diagnosis with diabetes but subsequently has been on insulin using various regimens Level of control previously is not known as he is new to the town and no previous records are available  Recent history:   INSULIN regimen is: 22-24 Lantus, pm dose 10 pm. Novolog 5-5-7 units at meals    Non-insulin hypoglycemic drugs the patient is taking are: none  His baseline A1c was 9.4 and now appears to be gradually increasing with the range of 7.6-8.2  Current management, blood sugar patterns and problems identified:  He has much higher blood sugars overall compared to his last visit  Most of his high readings are in the evening hours  Compliance with mealtime insulin is mostly poor with his eating out fairly often or not taking his insulin when he is not eating at home  Also he says that he is not eating a full meal frequently and is mostly having snacks especially around lunchtime  Even when he is eating more carbohydrate or eating out as well as having some alcoholic drinks is not increasing his NovoLog insulin  He still has not checked to see if he has coverage for the V-go pump which was recommended  FASTING readings appear to be mostly relatively higher even with increasing evening Lantus by 2 units  He again says he has a large supply of LANTUS and is not ready to change.  His mealtimes are somewhat variable especially in the evenings and not clear which readings are after he is eating  HIGHEST readings are around 8-10 PM with also some readings that are high before supper especially when he is not taking his insulin for  his meal and then his blood sugar will be persistently high  Occasionally may overestimate his insulin and blood sugar will be relatively low  Has had one low reading at midnight possibly from taking Novolog late at night after eating  He has 106 readings for the last 30 days  Compliance with the medical regimen: Inconsistent  Glucose monitoring:  done 2-3 times a day         Glucometer:  Contour Blood Glucose readings by download  Mean values apply above for all meters except median for One Touch  PRE-MEAL Fasting Lunch Dinner 6 PM-11 PM  Overall  Glucose range: 1 28-320  48-248  55-479  89-523    Mean/median: 186 157  200  256  204+/-92      Self-care:        Meal times are:  Breakfast is at 8 Lunch: Dinner: 7 pm   Typical meal intake: Breakfast is oatmeal  Lunch is sandwich , dinner is chicken or fish with pasta and salad               Dietician visit, most recent: 9/17               Exercise: a little  walking  Weight history:   Wt Readings from Last 3 Encounters:  11/27/16 192 lb (  87.1 kg)  11/18/16 190 lb 3.2 oz (86.3 kg)  08/15/16 191 lb 9.6 oz (86.9 kg)    Glycemic control:   Lab Results  Component Value Date   HGBA1C 8.2 (H) 11/10/2016   HGBA1C 7.8 (H) 08/12/2016   HGBA1C 7.6 (H) 08/05/2016   Lab Results  Component Value Date   MICROALBUR <0.7 05/29/2016   LDLCALC 59 04/25/2016   CREATININE 1.00 11/10/2016   Lab Results  Component Value Date   MICRALBCREAT 1.0 05/29/2016       Allergies as of 11/27/2016      Reactions   Codeine Nausea And Vomiting   Penicillins Rash      Medication List       Accurate as of 11/27/16 10:32 AM. Always use your most recent med list.          aspirin EC 81 MG tablet Take 81 mg by mouth daily.   atorvastatin 80 MG tablet Commonly known as:  LIPITOR TAKE 1 TABLET (80 MG TOTAL) BY MOUTH DAILY.   azithromycin 250 MG tablet Commonly known as:  ZITHROMAX Taper dose   BAYER CONTOUR NEXT TEST test  strip Generic drug:  glucose blood USE AS INSTRUCTED TO CHECK BLOOD SUGAR 4 TIMES PER DAY DX CODE E11.65   benzonatate 100 MG capsule Commonly known as:  TESSALON Take 1 capsule (100 mg total) by mouth 3 (three) times daily.   carvedilol 12.5 MG tablet Commonly known as:  COREG Take 1 tablet (12.5 mg total) by mouth 2 (two) times daily with a meal.   cholecalciferol 1000 units tablet Commonly known as:  VITAMIN D Take 2,000 Units by mouth daily.   eplerenone 25 MG tablet Commonly known as:  INSPRA Take 1 tablet (25 mg total) by mouth daily.   furosemide 40 MG tablet Commonly known as:  LASIX TAKE 1 TABLET EVERY DAY   furosemide 40 MG tablet Commonly known as:  LASIX TAKE 1 TABLET BY MOUTH EVERY DAY   insulin aspart 100 UNIT/ML injection Commonly known as:  novoLOG Inject 100 Units into the skin 3 (three) times daily before meals.   insulin glargine 100 UNIT/ML injection Commonly known as:  LANTUS Inject 1 mL (100 Units total) into the skin at bedtime.   Insulin Syringe-Needle U-100 31G X 5/16" 0.3 ML Misc Commonly known as:  B-D INS SYR HALF-UNIT .3CC/31G Use 6 times a day   metoCLOPramide 10 MG tablet Commonly known as:  REGLAN Take 1 tablet (10 mg total) by mouth 4 (four) times daily.   omeprazole 40 MG capsule Commonly known as:  PRILOSEC Take 1 capsule (40 mg total) by mouth 2 (two) times daily.   valsartan 80 MG tablet Commonly known as:  DIOVAN TAKE 1 TABLET BY MOUTH EVERY DAY       Allergies:  Allergies  Allergen Reactions  . Codeine Nausea And Vomiting  . Penicillins Rash    Past Medical History:  Diagnosis Date  . Cardiac arrest ([facility]) 05/22/2016  . CHF (congestive heart failure) ([place])   . Diabetes mellitus without complication ([place])   . Hyperlipidemia   . Hypertension   . Myocardial infarct ([place]) 2105  . S/P primary angioplasty with coronary stent 05/22/2016    No past surgical history on file.  Family History  Problem Relation Age  of Onset  . Alzheimer's disease Mother   . Heart disease Father   . Peripheral Artery Disease Father   . Heart disease Brother     Social History:  reports that he has never smoked. He has never used smokeless tobacco. He reports that he drinks about 0.6 oz of alcohol per week . He reports that he does not use drugs.   Review of Systems   Lipid history: On Lipitor with good control    Lab Results  Component Value Date   CHOL 113 (L) 04/25/2016   HDL 35 (L) 04/25/2016   LDLCALC 59 04/25/2016   TRIG 97 04/25/2016   CHOLHDL 3.2 04/25/2016           Hypertension: Present, has been controlled, also on carvedilol for history of CHF along with Diovan  Most recent eye exam was 2017, has had Laser Treatment for retinopathy  Most recent foot exam: 9/17   LABS:  No visits with results within 1 Week(s) from this visit.  Latest known visit with results is:  Lab on 11/10/2016  Component Date Value Ref Range Status  . Hgb A1c MFr Bld 11/10/2016 8.2* 4.6 - 6.5 % Final  . Sodium 11/10/2016 138  135 - 145 mEq/L Final  . Potassium 11/10/2016 4.4  3.5 - 5.1 mEq/L Final  . Chloride 11/10/2016 103  96 - 112 mEq/L Final  . CO2 11/10/2016 31  19 - 32 mEq/L Final  . Glucose, Bld 11/10/2016 143* 70 - 99 mg/dL Final  . BUN 11/10/2016 23  6 - 23 mg/dL Final  . Creatinine, Ser 11/10/2016 1.00  0.40 - 1.50 mg/dL Final  . Total Bilirubin 11/10/2016 0.5  0.2 - 1.2 mg/dL Final  . Alkaline Phosphatase 11/10/2016 57  39 - 117 U/L Final  . AST 11/10/2016 21  0 - 37 U/L Final  . ALT 11/10/2016 24  0 - 53 U/L Final  . Total Protein 11/10/2016 6.9  6.0 - 8.3 g/dL Final  . Albumin 11/10/2016 4.0  3.5 - 5.2 g/dL Final  . Calcium 11/10/2016 9.3  8.4 - 10.5 mg/dL Final  . GFR 11/10/2016 80.48  >60.00 mL/min Final    Physical Examination:      BP 130/68   Pulse 71   Ht 5\' 9"  (1.753 m)   Wt 192 lb (87.1 kg)   BMI 28.35 kg/m      ASSESSMENT:  Diabetes, uncontrolled, Insulin-dependent And  long-standing See history of present illness for detailed discussion of current diabetes management, blood sugar patterns and problems identified  He has had Gradual increase in A1c Blood sugars have been overall higher especially postprandial but also some in the morning also He appears to be going off his diet more frequently with eating out He does need flexibility in his insulin adjustment for meals since his intake is quite variable and may not always eating a full meal at a time He has difficulty remembering to take his insulin with him when he is going out to eat and this is usually causing persistently high readings in the afternoons and evenings He does need more diabetes education with the diabetes educator and nutritionist   PLAN:    Increase dose at dinnertime based on his intake and may take up to 10 units, does not need to stay with a fixed dose because of variable intake  Strongly encouraged him to look at the V-go pump because of overall better insulin delivery as well as flexibility in taking boluses for his variable intake and inability to take doses when he is not at home.  He is only going to use this if it is affordable and will look into this  He will need to keep a record of what he is eating, blood sugar before and after eating and is insulin doses for review with the nurse educator to help his management on a day-to-day basis  Meanwhile increase Lantus to 26 at night because of his usual increase in blood sugar overnight  Patient Instructions  Take insulin with you when going out  Novolog 6-10 units at supper  Lantus 26 at nite  Counseling time on subjects discussed above is over 50% of today's 25 minute visit  Jeffrey [name] 11/27/2016, 10:32 AM   Note: This office note was prepared with Dragon voice recognition system technology. Any transcriptional errors that result from this process are unintentional.

## 2016-11-27 NOTE — Patient Instructions (Addendum)
Take insulin with you when going out  Novolog 6-10 units at supper  Lantus 26 at nite

## 2016-11-28 ENCOUNTER — Inpatient Hospital Stay (HOSPITAL_COMMUNITY): Admission: RE | Admit: 2016-11-28 | Payer: BLUE CROSS/BLUE SHIELD | Source: Ambulatory Visit

## 2016-11-28 ENCOUNTER — Other Ambulatory Visit: Payer: Self-pay | Admitting: Internal Medicine

## 2016-11-28 NOTE — Telephone Encounter (Signed)
Follow up in 3 months.  Sooner if there are changes.

## 2016-11-28 NOTE — Telephone Encounter (Signed)
Left message to call back  

## 2016-12-04 ENCOUNTER — Other Ambulatory Visit: Payer: Self-pay | Admitting: Internal Medicine

## 2016-12-08 ENCOUNTER — Telehealth: Payer: Self-pay | Admitting: Cardiovascular Disease

## 2016-12-08 NOTE — Telephone Encounter (Signed)
Spoke with patient and scheduled 3 month follow up appointment

## 2016-12-08 NOTE — Telephone Encounter (Signed)
New Message ° °Pt voiced returning nurses call. °

## 2016-12-08 NOTE — Telephone Encounter (Signed)
Spoke with patient and scheduled follow up appointment.

## 2016-12-15 ENCOUNTER — Encounter: Payer: BLUE CROSS/BLUE SHIELD | Admitting: Nutrition

## 2016-12-16 ENCOUNTER — Ambulatory Visit: Payer: BLUE CROSS/BLUE SHIELD | Admitting: Cardiovascular Disease

## 2016-12-18 ENCOUNTER — Ambulatory Visit (INDEPENDENT_AMBULATORY_CARE_PROVIDER_SITE_OTHER): Payer: Self-pay | Admitting: Podiatry

## 2016-12-18 DIAGNOSIS — B351 Tinea unguium: Secondary | ICD-10-CM

## 2016-12-22 ENCOUNTER — Encounter: Payer: BLUE CROSS/BLUE SHIELD | Attending: Endocrinology | Admitting: Nutrition

## 2016-12-22 DIAGNOSIS — Z794 Long term (current) use of insulin: Secondary | ICD-10-CM

## 2016-12-22 DIAGNOSIS — Z713 Dietary counseling and surveillance: Secondary | ICD-10-CM | POA: Insufficient documentation

## 2016-12-22 DIAGNOSIS — E1165 Type 2 diabetes mellitus with hyperglycemia: Secondary | ICD-10-CM | POA: Insufficient documentation

## 2016-12-22 DIAGNOSIS — Z6827 Body mass index (BMI) 27.0-27.9, adult: Secondary | ICD-10-CM | POA: Diagnosis not present

## 2016-12-23 NOTE — Progress Notes (Signed)
Pt presents with mycotic infection of nails 1-5 bilateral   All other systems are negative  Laser therapy administered to affected nails and tolerated well. All safety precautions were in place. Re-appointed in 4 weeks for 5th treatment 

## 2016-12-23 NOTE — Progress Notes (Signed)
Pt. Brought in log of meals/and blood sugars.  Says he has discovered that when he drinks coffee, his blood sugar rises very high--250-283.  On mornings when eats the same, but drinks decaf coffee, blood sugar is 150 2 hr. Pc.    Also when he uses mouthwash, his blood sugar rises almost 100 points.  Says he looked them both up and both report causing blood sugar rises.  He has since stopped drinking caffinated coffee, and is not using the mouth wash, and blood sugars are better Typical day: 10AM: oatmeal with banana  N: 24u, Novolog 5-7u (depending on FBS) 1PM: sandwich with water  Nov:5u 8PM: supper, 4-6 ounces protein 1-2 servings of carb, 1 non starchy veg.  Nov: 5u HS: 24N   Michela Pitcher he tried 26u, but blood sugar drop to 46 at 3AM:  Now is using 24BID,a nd FBSs are around 113. He has tried 20u of NPH at HS, but FBSs are 160s, so has gone back to 24u.  Discussed the idea that if eating more/less carb at meals, then he will increase/decrease Novolog before the meal, not take more/less the next meal.   He reported good understanding of this, with no questions. Suggestions given:  Test blood sugar 2hr. pc B, and if Blood sugar is over 180, increase Novolog ac meal by 1-2 units.  SBGM:  FBSs: 113-166, acL: 125-180, acS: 113-200 (having snack sometimes late in afternoon), and HS: 141-149, with occasional 170s

## 2016-12-28 NOTE — Patient Instructions (Signed)
Test blood sugar 2hr. After meals, one time each day:  If blood sugar is over 180, will need 1 extra unit of Novolog for that meal, the next time it is eaten.  Please try breakfast first. Call if questions.

## 2017-01-15 ENCOUNTER — Other Ambulatory Visit: Payer: Self-pay | Admitting: Endocrinology

## 2017-01-19 ENCOUNTER — Ambulatory Visit (INDEPENDENT_AMBULATORY_CARE_PROVIDER_SITE_OTHER): Payer: Self-pay | Admitting: Podiatry

## 2017-01-19 DIAGNOSIS — B351 Tinea unguium: Secondary | ICD-10-CM

## 2017-01-23 ENCOUNTER — Other Ambulatory Visit (INDEPENDENT_AMBULATORY_CARE_PROVIDER_SITE_OTHER): Payer: BLUE CROSS/BLUE SHIELD

## 2017-01-23 DIAGNOSIS — E1165 Type 2 diabetes mellitus with hyperglycemia: Secondary | ICD-10-CM

## 2017-01-23 DIAGNOSIS — Z794 Long term (current) use of insulin: Secondary | ICD-10-CM

## 2017-01-23 LAB — BASIC METABOLIC PANEL
BUN: 31 mg/dL — ABNORMAL HIGH (ref 6–23)
CO2: 27 mEq/L (ref 19–32)
Calcium: 9.1 mg/dL (ref 8.4–10.5)
Chloride: 106 mEq/L (ref 96–112)
Creatinine, Ser: 1.07 mg/dL (ref 0.40–1.50)
GFR: 74.38 mL/min (ref >60.00)
Glucose, Bld: 142 mg/dL — ABNORMAL HIGH (ref 70–99)
Potassium: 4.7 mEq/L (ref 3.5–5.1)
Sodium: 140 mEq/L (ref 135–145)

## 2017-01-24 LAB — FRUCTOSAMINE: Fructosamine: 288 umol/L — ABNORMAL HIGH (ref 0–285)

## 2017-01-27 ENCOUNTER — Encounter: Payer: Self-pay | Admitting: Endocrinology

## 2017-01-27 ENCOUNTER — Ambulatory Visit (INDEPENDENT_AMBULATORY_CARE_PROVIDER_SITE_OTHER): Payer: BLUE CROSS/BLUE SHIELD | Admitting: Endocrinology

## 2017-01-27 VITALS — BP 142/80 | HR 60 | Ht 69.0 in | Wt 184.8 lb

## 2017-01-27 DIAGNOSIS — E1142 Type 2 diabetes mellitus with diabetic polyneuropathy: Secondary | ICD-10-CM

## 2017-01-27 DIAGNOSIS — E1165 Type 2 diabetes mellitus with hyperglycemia: Secondary | ICD-10-CM

## 2017-01-27 DIAGNOSIS — Z794 Long term (current) use of insulin: Secondary | ICD-10-CM

## 2017-01-27 LAB — GLUCOSE, POCT (MANUAL RESULT ENTRY): POC Glucose: 60 mg/dl — AB (ref 70–99)

## 2017-01-27 NOTE — Progress Notes (Signed)
Pt presents with mycotic infection of nails 1-5 bilateral   All other systems are negative  Laser therapy administered to affected nails and tolerated well. All safety precautions were in place. Re-appointed in 3 months to follow up with Dr Jacqualyn Posey, if need for evaluation

## 2017-01-27 NOTE — Progress Notes (Signed)
Patient ID: Jeffrey [name], male   DOB: 06-25-1956, 61 y.o.   MRN: [id number]            Reason for Appointment: Follow-up for Type 2 Diabetes  Referring physician: [place]   History of Present Illness:          Date of diagnosis of type 2 diabetes mellitus: In his 25s       Background history:   He was treated with some oral agents in the first few years of diagnosis with diabetes but subsequently has been on insulin using various regimens Level of control previously is not known as he is new to the town and no previous records are available  Recent history:   INSULIN regimen is: 20--24 Lantus, pm dose 10 pm. Novolog 5-5-5 units at meals    Non-insulin hypoglycemic drugs the patient is taking are: none  His baseline A1c was 9.4 and his last level was 8.2 in April and increasing Fructosamine now improved at 288  Current management, blood sugar patterns and problems identified:  He was referred to the diabetes educator on the last visit because of his increasing hyperglycemia  Since that he has done much better with his blood sugar control, previously blood sugar was averaging over 200 at home  He has also lost about 8 pounds likely from cutting back on portions  He is also cutting back on overall carbohydrate and fat intake  He also thinks that he is having better sugars because of cutting back on large amounts of coffee was drinking, also occasionally was having alcoholic drinks  Insulin dose has been the same, however he will take 7 units at suppertime if he is eating dessert  FASTING readings have been somewhat variable again but on an average only mildly increased, occasionally he has had readings as low as 52 in the morning; however on and averages fasting readings are better than before  Overall blood sugars are fairly stable midday and afternoon  He does tend to have some significantly high readings sporadically late afternoon and after supper based on his diet  He  says that last month when he was moving to a different apartment he was not paying attention to his diabetes at all and this causes variability in his blood sugars including some low sugars also  He is still very compliant with checking his blood sugars at least 3 times a day on an average  Blood sugar averages as follows:  He has 106 readings for the last 30 days  Compliance with the medical regimen: Inconsistent  Glucose monitoring:  done 2-3 times a day         Glucometer:  Contour Blood Glucose readings by download  Mean values apply above for all meters except median for One Touch  PRE-MEAL Fasting Lunch Dinner Bedtime Overall  Glucose range:  52-310    68-3 28  51-329  Mean/median: 151  145  142  157 152     Self-care:        Meal times are:  Breakfast is at 8 Lunch: Dinner: 7 pm   Typical meal intake: Breakfast is oatmeal  Lunch is sandwich , dinner is chicken or fish with pasta and salad               Dietician visit, most recent: 9/17               Exercise: a little  walking  Weight history:   Wt Readings from Last  3 Encounters:  01/27/17 184 lb 12.8 oz (83.8 kg)  12/28/16 187 lb 9.6 oz (85.1 kg)  11/27/16 192 lb (87.1 kg)    Glycemic control:   Lab Results  Component Value Date   HGBA1C 8.2 (H) 11/10/2016   HGBA1C 7.8 (H) 08/12/2016   HGBA1C 7.6 (H) 08/05/2016   Lab Results  Component Value Date   MICROALBUR <0.7 05/29/2016   LDLCALC 59 04/25/2016   CREATININE 1.07 01/23/2017   Lab Results  Component Value Date   MICRALBCREAT 1.0 05/29/2016      Allergies as of 01/27/2017      Reactions   Codeine Nausea And Vomiting   Penicillins Rash      Medication List       Accurate as of 01/27/17  9:13 PM. Always use your most recent med list.          aspirin EC 81 MG tablet Take 81 mg by mouth daily.   atorvastatin 80 MG tablet Commonly known as:  LIPITOR TAKE 1 TABLET (80 MG TOTAL) BY MOUTH DAILY.   azithromycin 250 MG tablet Commonly  known as:  ZITHROMAX Taper dose   carvedilol 12.5 MG tablet Commonly known as:  COREG Take 1 tablet (12.5 mg total) by mouth 2 (two) times daily with a meal.   cholecalciferol 1000 units tablet Commonly known as:  VITAMIN D Take 2,000 Units by mouth daily.   CONTOUR NEXT TEST test strip Generic drug:  glucose blood USE AS INSTRUCTED TO CHECK BLOOD SUGAR 4 TIMES PER DAY DX CODE E11.65   eplerenone 25 MG tablet Commonly known as:  INSPRA TAKE 1 TABLET BY MOUTH EVERY DAY   furosemide 40 MG tablet Commonly known as:  LASIX TAKE 1 TABLET EVERY DAY   insulin aspart 100 UNIT/ML injection Commonly known as:  novoLOG Inject 100 Units into the skin 3 (three) times daily before meals.   insulin glargine 100 UNIT/ML injection Commonly known as:  LANTUS Inject 1 mL (100 Units total) into the skin at bedtime.   Insulin Syringe-Needle U-100 31G X 5/16" 0.3 ML Misc Commonly known as:  B-D INS SYR HALF-UNIT .3CC/31G Use 6 times a day   metoCLOPramide 10 MG tablet Commonly known as:  REGLAN TAKE 1 TABLET BY MOUTH 4 TIMES A DAY   omeprazole 40 MG capsule Commonly known as:  PRILOSEC Take 1 capsule (40 mg total) by mouth 2 (two) times daily.   valsartan 80 MG tablet Commonly known as:  DIOVAN TAKE 1 TABLET BY MOUTH EVERY DAY       Allergies:  Allergies  Allergen Reactions  . Codeine Nausea And Vomiting  . Penicillins Rash    Past Medical History:  Diagnosis Date  . Cardiac arrest ([place]) 05/22/2016  . CHF (congestive heart failure) ([place])   . Diabetes mellitus without complication ([place])   . Hyperlipidemia   . Hypertension   . Myocardial infarct ([facility]) 2105  . S/P primary angioplasty with coronary stent 05/22/2016    No past surgical history on file.  Family History  Problem Relation Age of Onset  . Alzheimer's disease Mother   . Heart disease Father   . Peripheral Artery Disease Father   . Heart disease Brother     Social History:  reports that he has never  smoked. He has never used smokeless tobacco. He reports that he drinks about 0.6 oz of alcohol per week . He reports that he does not use drugs.   Review of Systems  Lipid history: On Lipitor with good control    Lab Results  Component Value Date   CHOL 113 (L) 04/25/2016   HDL 35 (L) 04/25/2016   LDLCALC 59 04/25/2016   TRIG 97 04/25/2016   CHOLHDL 3.2 04/25/2016           Hypertension: Present, has been controlled, also on carvedilol for history of CHF along with Diovan  Most recent eye exam was 2017, has had Laser Treatment for retinopathy  Most recent foot exam: 9/17   LABS:  Office Visit on 01/27/2017  Component Date Value Ref Range Status  . POC Glucose 01/27/2017 60* 70 - 99 mg/dl Final  Lab on 01/23/2017  Component Date Value Ref Range Status  . Fructosamine 01/23/2017 288* 0 - 285 umol/L Final   Comment: Published reference interval for apparently healthy subjects between age 64 and 71 is 65 - 285 umol/L and in a poorly controlled diabetic population is 228 - 563 umol/L with a mean of 396 umol/L.   [name] Kitchen Sodium 01/23/2017 140  135 - 145 mEq/L Final  . Potassium 01/23/2017 4.7  3.5 - 5.1 mEq/L Final  . Chloride 01/23/2017 106  96 - 112 mEq/L Final  . CO2 01/23/2017 27  19 - 32 mEq/L Final  . Glucose, Bld 01/23/2017 142* 70 - 99 mg/dL Final  . BUN 01/23/2017 31* 6 - 23 mg/dL Final  . Creatinine, Ser 01/23/2017 1.07  0.40 - 1.50 mg/dL Final  . Calcium 01/23/2017 9.1  8.4 - 10.5 mg/dL Final  . GFR 01/23/2017 74.38  >60.00 mL/min Final    Physical Examination:      BP (!) 142/80   Pulse 60   Ht 5\' 9"  (1.753 m)   Wt 184 lb 12.8 oz (83.8 kg)   SpO2 98%   BMI 27.29 kg/m      ASSESSMENT:  Diabetes, uncontrolled, Insulin-dependent And long-standing See history of present illness for detailed discussion of current diabetes management, blood sugar patterns and problems identified  He has had Improvement in his blood sugar control mostly from his trying to  be more attentive to his diet especially around the time  when he was seen by nurse educator He is cutting back on portions, high calorie foods and caffeine Without any change in his insulin and blood sugars are more even compared to before and averaging about 50 mg lower on an average He does think he is taking generally less insulin at suppertime than before   PLAN:    No change in basic insulin regimen  Discussed that he is having variability in his blood sugars especially overnight and he will do better with using [place] as an option  However he is very reluctant to change as he is used to taking Lantus for years and does not want to use an insulin pen also  He is concerned about the cost of any change also  He does need to be adjusting his mealtime doses based on amount of carbohydrate  Encouraged him to be more active, even if he can walk for 5 minutes  He will need to either have any snack or reduce his mealtime insulin to prevent hypoglycemia with increased activity which appears to occur at this morning in the office and his glucose was 60  There are no Patient Instructions on file for this visit.  Counseling time on diabetes management and  subjects discussed above is over 50% of today's 25 minute visit  Clorissa Gruenberg 01/27/2017, 9:13 PM  Note: This office note was prepared with Estate agent. Any transcriptional errors that result from this process are unintentional.

## 2017-02-17 ENCOUNTER — Encounter: Payer: Self-pay | Admitting: Cardiovascular Disease

## 2017-02-17 ENCOUNTER — Ambulatory Visit (INDEPENDENT_AMBULATORY_CARE_PROVIDER_SITE_OTHER): Payer: BLUE CROSS/BLUE SHIELD | Admitting: Cardiovascular Disease

## 2017-02-17 VITALS — BP 94/59 | HR 65 | Ht 68.0 in | Wt 182.6 lb

## 2017-02-17 DIAGNOSIS — R42 Dizziness and giddiness: Secondary | ICD-10-CM

## 2017-02-17 DIAGNOSIS — E7800 Pure hypercholesterolemia, unspecified: Secondary | ICD-10-CM

## 2017-02-17 DIAGNOSIS — R0602 Shortness of breath: Secondary | ICD-10-CM

## 2017-02-17 DIAGNOSIS — R079 Chest pain, unspecified: Secondary | ICD-10-CM

## 2017-02-17 DIAGNOSIS — I469 Cardiac arrest, cause unspecified: Secondary | ICD-10-CM

## 2017-02-17 MED ORDER — CARVEDILOL 6.25 MG PO TABS: 6.2500 mg | ORAL_TABLET | Freq: Two times a day (BID) | ORAL | 5 refills | Status: DC

## 2017-02-17 MED ORDER — VALSARTAN 40 MG PO TABS: 40.0000 mg | ORAL_TABLET | Freq: Every day | ORAL | 5 refills | Status: DC

## 2017-02-17 NOTE — Progress Notes (Signed)
Cardiology Office Note   Date:  02/17/2017   ID:  Jeffrey [name], DOB 12-09-1955, MRN [id number]  PCP:  Elby Showers, MD  Cardiologist:   Skeet Latch, MD   Chief Complaint  Patient presents with  . Follow-up    3 months;      History of Present Illness: Jeffrey [name] is a 61 y.o. male with hypertension, hyperlipidemia, diabetes and peripheral neuropathy who presents for follow up.  Jeffrey [name] recently moved to [facility] from [place].  He moved here with his identical twin brother.  Jeffrey [name] had a cardiac arrest 11/23/13 and underwent PCI of the LAD.  Echo 10/2015 revealed an anteroseptal and apical septal infarct with associated hypokinesis.  LVEF was 55%.  He had a PET CT/myocardial perfusion scan 10/2015 that revealed a moderate sized, moderate intensity antero-apical infarct with mild ischemia.  At the end of 2016 he had an episode of stabbing chest pain.  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  At that time his clopidogrel was discontinued and he underwent several laser procedures.  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    At his last appointment Jeffrey [name] reported shortness of breath with minimal exertion and lower extremity edema.  He was referred for an echo and Lexiscan Myoview but was unable to afford the tests.  Since that time he's been feeling well. He reports a few episodes of pinching epigastric discomfort that occurs once every few weeks. He does note that he tried to self discontinued his acid reflux medications as he read that he is not supposed to take these long-term. The symptoms are not exertional and is not associated with shortness of breath, lightheadedness, dizziness, nausea, or diaphoresis. The episodes last for a few seconds at a time. He started trying to get more exercise. He and his brother moved into a new townhome and he has  been doing a lot of cleaning. He also went for a long walk and felt well with this activity. He did call EMS 2 times due to lightheadedness. At the time his blood pressure was noted to be very low and dropped lower with standing. He was encouraged to go to the emergency department but declined.  Jeffrey [name] he has not noted any lower extremity edema, orthopnea, or PND. He notes that he has lost 10 pounds in the last 3 months by changing his diet.   Past Medical History:  Diagnosis Date  . Cardiac arrest ([place]) 05/22/2016  . CHF (congestive heart failure) ([place])   . Diabetes mellitus without complication ([place])   . Hyperlipidemia   . Hypertension   . Myocardial infarct ([place]) 2105  . S/P primary angioplasty with coronary stent 05/22/2016    No past surgical history on file.   Current Outpatient Prescriptions  Medication Sig Dispense Refill  . aspirin EC 81 MG tablet Take 81 mg by mouth daily.    [name] Kitchen atorvastatin (LIPITOR) 80 MG tablet TAKE 1 TABLET (80 MG TOTAL) BY MOUTH DAILY. 30 tablet 11  . azithromycin (ZITHROMAX) 250 MG tablet Taper dose 6 tablet 0  . carvedilol (COREG) 6.25 MG tablet Take 1 tablet (6.25 mg total) by mouth 2 (two) times daily with a meal. 60 tablet 5  . cholecalciferol (VITAMIN D) 1000 units tablet Take 2,000 Units by mouth daily.    [place]ling [place]  NEXT TEST test strip USE AS INSTRUCTED TO CHECK BLOOD SUGAR 4 TIMES PER DAY DX CODE E11.65 100 each 3  . eplerenone (INSPRA) 25 MG tablet TAKE 1 TABLET BY MOUTH EVERY DAY 30 tablet 5  . furosemide (LASIX) 40 MG tablet TAKE 1 TABLET EVERY DAY 30 tablet 5  . insulin aspart (NOVOLOG) 100 UNIT/ML injection Inject 100 Units into the skin 3 (three) times daily before meals. (Patient taking differently: Inject 5 Units into the skin 3 (three) times daily with meals. Takes 5-7 units with meals) 30 mL 5  . insulin glargine (LANTUS) 100 UNIT/ML injection Inject 1 mL (100 Units total) into the skin at bedtime. (Patient taking differently: Inject  20-24 Units into the skin 2 (two) times daily. ) 30 mL 5  . Insulin Syringe-Needle U-100 (B-D INS SYR HALF-UNIT .3CC/31G) 31G X 5/16" 0.3 ML MISC Use 6 times a day 200 each 11  . metoCLOPramide (REGLAN) 10 MG tablet TAKE 1 TABLET BY MOUTH 4 TIMES A DAY 120 tablet 2  . omeprazole (PRILOSEC) 40 MG capsule Take 1 capsule (40 mg total) by mouth 2 (two) times daily. 60 capsule 5  . valsartan (DIOVAN) 40 MG tablet Take 1 tablet (40 mg total) by mouth daily. 30 tablet 5   No current facility-administered medications for this visit.     Allergies:   Codeine and Penicillins    Social History:  The patient  reports that he has never smoked. He has never used smokeless tobacco. He reports that he drinks about 0.6 oz of alcohol per week . He reports that he does not use drugs.   Family History:  The patient's family history includes Alzheimer's disease in his mother; Heart disease in his brother and father; Peripheral Artery Disease in his father.    ROS:  Please see the history of present illness.   Otherwise, review of systems are positive for cold hands.   All other systems are reviewed and negative.    PHYSICAL EXAM: VS:  BP (!) 94/59   Pulse 65   Ht 5\' 8"  (1.727 m)   Wt 82.8 kg (182 lb 9.6 oz)   BMI 27.76 kg/m  , BMI Body mass index is 27.76 kg/m. GENERAL:  Well appearing.  No acute distress. HEENT: Pupils equal round and reactive, fundi not visualized, oral mucosa unremarkable NECK:  No jugular venous distention, waveform within normal limits, carotid upstroke brisk and symmetric, no bruits, no thyromegaly LUNGS:  Clear to auscultation bilaterally HEART:  RRR.  PMI not displaced or sustained,S1 and S2 within normal limits, no S3, no S4, no clicks, no rubs, no murmurs ABD:  Flat, positive bowel sounds normal in frequency in pitch, no bruits, no rebound, no guarding, no midline pulsatile mass, no hepatomegaly, no splenomegaly EXT:  2 plus pulses throughout, no edema, no cyanosis no  clubbing SKIN:  No rashes no nodules NEURO:  Cranial nerves II through XII grossly intact, motor grossly intact throughout PSYCH:  Cognitively intact, oriented to person place and time  EKG:  EKG is not ordered today. The ekg ordered 05/22/16 demonstrates sinus rhythm rate 63 bpm.  Prior anteroseptal infarct.  LAFB 11/18/16: Sinus rhythm.  Rate 64 bpm.  Low voltage limb and precordial leads  Echo 11/01/15: LVEF >55%.  Mild concentric LVH. Mild anteroseptal and apical septal hypokinesis. Normal RV function.  Recent Labs: 04/25/2016: Hemoglobin 14.2; Platelets 363 08/05/2016: TSH 2.50 11/10/2016: ALT 24 01/23/2017: BUN 31; Creatinine, Ser 1.07; Potassium 4.7; Sodium 140  Lipid Panel    Component Value Date/Time   CHOL 113 (L) 04/25/2016 1156   TRIG 97 04/25/2016 1156   HDL 35 (L) 04/25/2016 1156   CHOLHDL 3.2 04/25/2016 1156   VLDL 19 04/25/2016 1156   LDLCALC 59 04/25/2016 1156    09/07/15: Sodium 138, potassium 4.5, BUN 20, creatinine 1.02 AST 17, ALT 24 Total cholesterol 113, triglycerides 146, HDL 32, LDL 52 TSH 1.76 WBC 11.7, hemoglobin 13.4, hematocrit 40.2, platelets 317 Hemoglobin A1c 8.9 on a relatively dry%  Wt Readings from Last 3 Encounters:  02/17/17 82.8 kg (182 lb 9.6 oz)  01/27/17 83.8 kg (184 lb 12.8 oz)  12/28/16 85.1 kg (187 lb 9.6 oz)      ASSESSMENT AND PLAN:  # CAD s/p cardiac arrest and LAD PCI: Mr. Macmurray reports Atypical chest pain that seems more consistent with acid reflux than ischemia. He hasn't experienced any exertional symptoms. He was previously performed for an echocardiogram and Lexiscan Myoview. However these were not performed due to cost and his symptoms have subsided.  Continue aspirin, and atorvastatin.  Reduce carvedilol to 6.25mg  q12h due to hypotension.    # Hypertension:  Blood pressure is low and he has been feeling tired.  Reduce valsartan to 40 mg and carvedilol to 6.25mg  bid.    # Hyperlipidemia.  LDL 59 on 04/2016.  Continue  atorvastatin. Goal LDL is <70.  # Dizziness: Likely due to hypotension. Reduce losartan and carvedilol as above. Check carotid Dopplers.  Current medicines are reviewed at length with the patient today.  The patient does not have concerns regarding medicines.  The following changes have been made:  no change  Labs/ tests ordered today include:   No orders of the defined types were placed in this encounter.    Disposition:   FU with Makaylin Carlo C. Oval Linsey, MD, [facility] in 2 weeks    This note was written with the assistance of speech recognition software.  Please excuse any transcriptional errors.  Signed, Maddelyn Rocca C. Oval Linsey, MD, [facility]  02/17/2017 1:04 PM    [facility]

## 2017-02-17 NOTE — Patient Instructions (Signed)
Medication Instructions:  DECREASE CARVEDILOL TO 6.25 MG TWICE A DAY   DECREASE YOUR VALSARTAN TO 40 MG DAILY   Labwork: NONE  Testing/Procedures: Your physician has requested that you have a carotid duplex. This test is an ultrasound of the carotid arteries in your neck. It looks at blood flow through these arteries that supply the brain with blood. Allow one hour for this exam. There are no restrictions or special instructions.  Follow-Up: Your physician recommends that you schedule a follow-up appointment in: 2 WEEKS WITH PA/NP/DR [facility]   If you need a refill on your cardiac medications before your next appointment, please call your pharmacy.

## 2017-02-24 ENCOUNTER — Telehealth: Payer: Self-pay | Admitting: Cardiovascular Disease

## 2017-02-24 NOTE — Telephone Encounter (Signed)
New message       Pt request to talk to Dr Oval Linsey only.  He would not tell me what he wanted.  However, he cancelled his carotid ultrasound and he also cancelled his appt with the PA on 03-09-17.  He did say when he cancelled that appt that "he pays his ins to see a doctor not a PA/NP".  Please call.

## 2017-02-24 NOTE — Telephone Encounter (Signed)
Spoke with pt he states that he changed his medicationa s discussed at last appt and had a carotid US 2-1/2 years ago and does not think that he needs one at this time, he expressed that he does not want to see APP only Dr Oval Linsey, scheduled next available appt 04-09-17 @9am 

## 2017-02-26 NOTE — Telephone Encounter (Signed)
OK 

## 2017-03-04 ENCOUNTER — Other Ambulatory Visit: Payer: Self-pay | Admitting: Internal Medicine

## 2017-03-04 ENCOUNTER — Encounter (HOSPITAL_COMMUNITY): Payer: BLUE CROSS/BLUE SHIELD

## 2017-03-09 ENCOUNTER — Ambulatory Visit: Payer: BLUE CROSS/BLUE SHIELD | Admitting: Student

## 2017-03-16 ENCOUNTER — Telehealth: Payer: Self-pay | Admitting: Endocrinology

## 2017-03-16 NOTE — Telephone Encounter (Signed)
Patient is requesting to transfer from Dr. Dwyane Dee to Dr. Cruzita Lederer.  Patient states he feels that Dr. Dwyane Dee doesn't listen to his needs and would like to see Gherghe after being recommended by a friend.

## 2017-03-16 NOTE — Telephone Encounter (Signed)
OK - need 45 min for the IV

## 2017-03-16 NOTE — Telephone Encounter (Signed)
No problem with this

## 2017-03-17 NOTE — Telephone Encounter (Signed)
Please see previous messages

## 2017-03-31 ENCOUNTER — Telehealth: Payer: Self-pay | Admitting: Internal Medicine

## 2017-03-31 NOTE — Telephone Encounter (Signed)
LM for pt to call back to discuss

## 2017-03-31 NOTE — Telephone Encounter (Signed)
See below

## 2017-03-31 NOTE — Telephone Encounter (Signed)
Patient is concerned his insurance wont cover appt w/ Gherghe b/c he already saw Dr. Dwyane Dee for new pt appt.   The appt w/ Gherghe is 45 mins long and he has a 20 copay. He says he can not afford to pay any more than that.  Okay to leave a detailed message.  Thank you,  -LL

## 2017-04-09 ENCOUNTER — Ambulatory Visit (INDEPENDENT_AMBULATORY_CARE_PROVIDER_SITE_OTHER): Payer: BLUE CROSS/BLUE SHIELD | Admitting: Cardiovascular Disease

## 2017-04-09 ENCOUNTER — Encounter: Payer: Self-pay | Admitting: Cardiovascular Disease

## 2017-04-09 VITALS — BP 126/74 | HR 62 | Ht 68.0 in | Wt 183.6 lb

## 2017-04-09 DIAGNOSIS — R079 Chest pain, unspecified: Secondary | ICD-10-CM | POA: Diagnosis not present

## 2017-04-09 DIAGNOSIS — I469 Cardiac arrest, cause unspecified: Secondary | ICD-10-CM | POA: Diagnosis not present

## 2017-04-09 DIAGNOSIS — E7800 Pure hypercholesterolemia, unspecified: Secondary | ICD-10-CM | POA: Diagnosis not present

## 2017-04-09 DIAGNOSIS — I10 Essential (primary) hypertension: Secondary | ICD-10-CM

## 2017-04-09 DIAGNOSIS — R0602 Shortness of breath: Secondary | ICD-10-CM

## 2017-04-09 NOTE — Patient Instructions (Signed)

## 2017-04-09 NOTE — Progress Notes (Signed)
Cardiology Office Note   Date:  04/09/2017   ID:  Jeffrey [name], DOB [birth year], MRN [id number]  PCP:  Elby Showers, MD  Cardiologist:   Skeet Latch, MD   Chief Complaint  Patient presents with  . Follow-up      History of Present Illness: Jeffrey [name] is a 61 y.o. male with hypertension, hyperlipidemia, diabetes and peripheral neuropathy who presents for follow up.  Jeffrey [name] recently moved to [facility] from [place].  He moved here with his identical twin brother.  Jeffrey [name] had a cardiac arrest 11/23/13 and underwent PCI of the LAD.  Echo 10/2015 revealed an anteroseptal and apical septal infarct with associated hypokinesis.  LVEF was 55%.  He had a PET CT/myocardial perfusion scan 10/2015 that revealed a moderate sized, moderate intensity antero-apical infarct with mild ischemia.  At the end of 2016 he had an episode of stabbing chest pain.  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  At that time his clopidogrel was discontinued and he underwent several laser procedures.  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    At his last appointment Jeffrey [name] reported fatigue and dizziness. Valsartan and carvedilol doses were both reduced.  At the time he had lost 10lb by changing his diet.  At that appointment he also complained of chest discomfort that seemed most consistent with GERD.  He was referred for carotid Dopplers but decided not to get it because his symptoms have improved.  After a week he started getting pain in his eyes.  He previously had laser surgery for bleeding in his R eye.  He attributed this to his blood pressure being too high.  He resumed his prior doses of valsartan and carvedilol.  He has been feeling well and the dizziness has improved.  He has been very concerned about his brother who was recently diagnosed with skin cancer.   Jeffrey [name]  was notified of the valsartan recall by [facility].  He returned his old pills and had new ones that are reportedly not affected by the recall.     Past Medical History:  Diagnosis Date  . Cardiac arrest ([place]) 05/22/2016  . CHF (congestive heart failure) ([place])   . Diabetes mellitus without complication ([place])   . Hyperlipidemia   . Hypertension   . Myocardial infarct ([place]) 2105  . S/P primary angioplasty with coronary stent 05/22/2016    No past surgical history on file.   Current Outpatient Prescriptions  Medication Sig Dispense Refill  . aspirin EC 81 MG tablet Take 81 mg by mouth daily.    [name] Kitchen atorvastatin (LIPITOR) 80 MG tablet TAKE 1 TABLET (80 MG TOTAL) BY MOUTH DAILY. 30 tablet 11  . carvedilol (COREG) 12.5 MG tablet TAKE 1 TABLET (12.5 MG TOTAL) BY MOUTH 2 (TWO) TIMES DAILY WITH A MEAL.  99  . cholecalciferol (VITAMIN D) 1000 units tablet Take 2,000 Units by mouth daily.    . CONTOUR NEXT TEST test strip USE AS INSTRUCTED TO CHECK BLOOD SUGAR 4 TIMES PER DAY DX CODE E11.65 100 each 3  . eplerenone (INSPRA) 25 MG tablet TAKE 1 TABLET BY MOUTH EVERY DAY 30 tablet 5  . furosemide (LASIX) 40 MG tablet TAKE 1 TABLET EVERY DAY 30 tablet 5  . insulin aspart (NOVOLOG) 100 UNIT/ML injection Inject 100 Units into the skin 3 (  three) times daily before meals. (Patient taking differently: Inject 5 Units into the skin 3 (three) times daily with meals. Takes 5-7 units with meals) 30 mL 5  . insulin glargine (LANTUS) 100 UNIT/ML injection Inject 1 mL (100 Units total) into the skin at bedtime. (Patient taking differently: Inject 20-24 Units into the skin 2 (two) times daily. ) 30 mL 5  . Insulin Syringe-Needle U-100 (B-D INS SYR HALF-UNIT .3CC/31G) 31G X 5/16" 0.3 ML MISC Use 6 times a day 200 each 11  . metoCLOPramide (REGLAN) 10 MG tablet TAKE 1 TABLET BY MOUTH 4 TIMES A DAY 120 tablet 2  . omeprazole (PRILOSEC) 40 MG capsule TAKE ONE CAPSULE BY MOUTH TWICE A DAY 180 capsule 1  . valsartan (DIOVAN) 80  MG tablet Take 80 mg by mouth daily.  11   No current facility-administered medications for this visit.     Allergies:   Codeine and Penicillins    Social History:  The patient  reports that he has never smoked. He has never used smokeless tobacco. He reports that he drinks about 0.6 oz of alcohol per week . He reports that he does not use drugs.   Family History:  The patient's family history includes Alzheimer's disease in his mother; Heart disease in his brother and father; Peripheral Artery Disease in his father.    ROS:  Please see the history of present illness.   Otherwise, review of systems are positive for cold hands.   All other systems are reviewed and negative.    PHYSICAL EXAM: VS:  BP 126/74   Pulse 62   Ht 5\' 8"  (1.727 m)   Wt 83.3 kg (183 lb 9.6 oz)   BMI 27.92 kg/m  , BMI Body mass index is 27.92 kg/m. GENERAL:  Well appearing.  No acute distress. HEENT: Pupils equal round and reactive, fundi not visualized, oral mucosa unremarkable NECK:  No jugular venous distention, waveform within normal limits, carotid upstroke brisk and symmetric, no bruits, no thyromegaly LUNGS:  Clear to auscultation bilaterally HEART:  RRR.  PMI not displaced or sustained,S1 and S2 within normal limits, no S3, no S4, no clicks, no rubs, no murmurs ABD:  Flat, positive bowel sounds normal in frequency in pitch, no bruits, no rebound, no guarding, no midline pulsatile mass, no hepatomegaly, no splenomegaly EXT:  2 plus pulses throughout, no edema, no cyanosis no clubbing SKIN:  No rashes no nodules NEURO:  Cranial nerves II through XII grossly intact, motor grossly intact throughout PSYCH:  Cognitively intact, oriented to person place and time  EKG:  EKG is not ordered today. The ekg ordered 05/22/16 demonstrates sinus rhythm rate 63 bpm.  Prior anteroseptal infarct.  LAFB 11/18/16: Sinus rhythm.  Rate 64 bpm.  Low voltage limb and precordial leads  Echo 11/01/15: LVEF >55%.  Mild  concentric LVH. Mild anteroseptal and apical septal hypokinesis. Normal RV function.  Recent Labs: 04/25/2016: Hemoglobin 14.2; Platelets 363 08/05/2016: TSH 2.50 11/10/2016: ALT 24 01/23/2017: BUN 31; Creatinine, Ser 1.07; Potassium 4.7; Sodium 140    Lipid Panel    Component Value Date/Time   CHOL 113 (L) 04/25/2016 1156   TRIG 97 04/25/2016 1156   HDL 35 (L) 04/25/2016 1156   CHOLHDL 3.2 04/25/2016 1156   VLDL 19 04/25/2016 1156   LDLCALC 59 04/25/2016 1156    09/07/15: Sodium 138, potassium 4.5, BUN 20, creatinine 1.02 AST 17, ALT 24 Total cholesterol 113, triglycerides 146, HDL 32, LDL 52 TSH 1.76 WBC 11.7, hemoglobin  13.4, hematocrit 40.2, platelets 317 Hemoglobin A1c 8.9 on a relatively dry%  Wt Readings from Last 3 Encounters:  04/09/17 83.3 kg (183 lb 9.6 oz)  02/17/17 82.8 kg (182 lb 9.6 oz)  01/27/17 83.8 kg (184 lb 12.8 oz)     ASSESSMENT AND PLAN:  # CAD s/p cardiac arrest and LAD PCI: Jeffrey [name] Is doing well and has no chest pain. Continue carvedilol, aspirin, and atorvastatin. He is due for her lipids and will have him checked with his PCP tomorrow. He will have these results sent to [place].  reports Atypical chest pain that seems more consistent with acid reflux than ischemia. He was previously referred for an echocardiogram and Lexiscan Myoview. However these were not performed due to cost and his symptoms have subsided.  Continue aspirin, atorvastatin, and carvedilol.   # Hypertension:  Blood pressure is better and his dizziness has subsided. Continue carvedilol, eplerenone, and valsartan.  His valsartan tablets have been replaced by [facility] with one cassette are not affected by the recall.  # Hyperlipidemia.  LDL 59 on 04/2016.  Continue atorvastatin. Goal LDL is <70.  Lipids will be checked with his PCP tomorrow.   # Dizziness:  Resolved  Current medicines are reviewed at length with the patient today.  The patient does not have concerns regarding  medicines.  The following changes have been made:  no change  Labs/ tests ordered today include:   No orders of the defined types were placed in this encounter.    Disposition:   FU with Analucia Hush C. Oval Linsey, MD, [facility] in 6 months.     This note was written with the assistance of speech recognition software.  Please excuse any transcriptional errors.  Signed, Neviah Braud C. Oval Linsey, MD, [facility]  04/09/2017 9:33 AM     [facility]

## 2017-04-10 ENCOUNTER — Ambulatory Visit: Payer: BLUE CROSS/BLUE SHIELD | Admitting: Family Medicine

## 2017-04-10 NOTE — Telephone Encounter (Signed)
That is not OK. I will not see him in October. Please let him know.

## 2017-04-10 NOTE — Telephone Encounter (Signed)
Pt returned my call As I was discussing the question below he began to talk about a conversation he had with someone about a refund. As I looked thru the chart and asked him additional questions to see if I could find out what he was talking about he became very upset and proceeded to scream at me to a point where I could not understand his words. I did ask him twice to please stop and he did not do so. I disconnected the call after that.

## 2017-04-10 NOTE — Telephone Encounter (Signed)
LM for pt to call back.

## 2017-04-14 ENCOUNTER — Encounter: Payer: Self-pay | Admitting: Family Medicine

## 2017-04-14 ENCOUNTER — Ambulatory Visit (INDEPENDENT_AMBULATORY_CARE_PROVIDER_SITE_OTHER): Payer: BLUE CROSS/BLUE SHIELD | Admitting: Family Medicine

## 2017-04-14 VITALS — BP 130/60 | HR 72 | Temp 98.3°F | Ht 68.0 in | Wt 184.0 lb

## 2017-04-14 DIAGNOSIS — E785 Hyperlipidemia, unspecified: Secondary | ICD-10-CM

## 2017-04-14 DIAGNOSIS — E1159 Type 2 diabetes mellitus with other circulatory complications: Secondary | ICD-10-CM

## 2017-04-14 DIAGNOSIS — Z794 Long term (current) use of insulin: Secondary | ICD-10-CM

## 2017-04-14 DIAGNOSIS — Z114 Encounter for screening for human immunodeficiency virus [HIV]: Secondary | ICD-10-CM | POA: Diagnosis not present

## 2017-04-14 DIAGNOSIS — Z23 Encounter for immunization: Secondary | ICD-10-CM

## 2017-04-14 DIAGNOSIS — Z1159 Encounter for screening for other viral diseases: Secondary | ICD-10-CM | POA: Diagnosis not present

## 2017-04-14 DIAGNOSIS — I2510 Atherosclerotic heart disease of native coronary artery without angina pectoris: Secondary | ICD-10-CM | POA: Insufficient documentation

## 2017-04-14 DIAGNOSIS — I10 Essential (primary) hypertension: Secondary | ICD-10-CM

## 2017-04-14 DIAGNOSIS — E1169 Type 2 diabetes mellitus with other specified complication: Secondary | ICD-10-CM | POA: Diagnosis not present

## 2017-04-14 DIAGNOSIS — I152 Hypertension secondary to endocrine disorders: Secondary | ICD-10-CM

## 2017-04-14 DIAGNOSIS — E1165 Type 2 diabetes mellitus with hyperglycemia: Secondary | ICD-10-CM

## 2017-04-14 DIAGNOSIS — Z9861 Coronary angioplasty status: Secondary | ICD-10-CM

## 2017-04-14 MED ORDER — METOCLOPRAMIDE HCL 10 MG PO TABS: 10.0000 mg | ORAL_TABLET | Freq: Four times a day (QID) | ORAL | 11 refills | Status: DC

## 2017-04-14 MED ORDER — INSULIN GLARGINE 100 UNIT/ML ~~LOC~~ SOLN: SUBCUTANEOUS | 11 refills | Status: DC

## 2017-04-14 MED ORDER — INSULIN ASPART 100 UNIT/ML ~~LOC~~ SOLN: 5.0000 [IU] | Freq: Three times a day (TID) | SUBCUTANEOUS | 11 refills | Status: DC

## 2017-04-14 MED ORDER — GLUCOSE BLOOD VI STRP: ORAL_STRIP | 11 refills | Status: DC

## 2017-04-14 MED ORDER — OMEPRAZOLE 40 MG PO CPDR: 40.0000 mg | DELAYED_RELEASE_CAPSULE | Freq: Two times a day (BID) | ORAL | 11 refills | Status: DC

## 2017-04-14 NOTE — Assessment & Plan Note (Signed)
Stable. Check lipid panel today. Continue ASA, lipitor, coreg, eplerenone, lasix, and valsartan.

## 2017-04-14 NOTE — Progress Notes (Signed)
Subjective:  Jeffrey [name] is a 61 y.o. male who presents today with a chief complaint of T2DM and also to establish care.   HPI:  DIABETES Type II, Chronic, New problem to this provider Medications: Lantus 20-24U bid, novolog5-7U tid with meals, Reports taking and tolerating without side effects. Blood Sugars per patient: Fasting: 120s, High: 180s, Low:60s  Other History- Patient previously managed by endocrinology. Wishes to have this office manage his medications. Reports that his A1c usually runs in the 8 range. Reports significant hypoglycemic symptoms when his sugars drop below the 120 range. Has been stable on his current regimen for several years.    CAD, Chronic Problem, New problem to this provider Patient suffered cardiac arrest 2-3 years ago. Underwent CPR and was found to have a blockage of his LAD artery. Reports that he had a stent placed in that artery. He now follows up regularly with cardiology in [place] (Cardiac arrest occurred while living in [place]).  Will follow up with cardiology twice annually. Doing well on atorvastatin, valsartan, coreg, and aspirin.   Hypertension, Chronic Problem, New problem to this provider BP Readings from Last 3 Encounters:  04/14/17 130/60  04/09/17 126/74  02/17/17 (!) 94/59   Current Medications: Coreg 12.5mg  bid, eplerenone 25mg  daily, lasix 40mg  daily, valsartan 80mg  daily (reports that his pharmacist gave him the non-recalled batch), compliant without side effects.  ROS: Denies any chest pain, shortness of breath, dyspnea on exertion, leg edema.   ROS: Per HPI, otherwise a 14 point review of systems was performed and was negative  PMH:  The following were reviewed and entered/updated in epic: Past Medical History:  Diagnosis Date  . Cardiac arrest ([place]) 05/22/2016  . CHF (congestive heart failure) ([place])   . Diabetes mellitus without complication ([place])   . Hyperlipidemia   . Hypertension   . Myocardial infarct ([place]) 2105   . S/P primary angioplasty with coronary stent 05/22/2016   Patient Active Problem List   Diagnosis Date Noted  . Coronary artery disease 04/14/2017  . Hyperlipidemia associated with type 2 diabetes mellitus ([place]) 04/14/2017  . Diabetic peripheral neuropathy associated with type 2 diabetes mellitus ([place]) 08/17/2016  . S/P primary angioplasty with coronary stent 05/22/2016  . History of cardiac arrest 05/22/2016  . Uncontrolled type 2 diabetes mellitus with hyperglycemia, with long-term current use of insulin ([place]) 05/02/2016  . Hypertension associated with diabetes ([place]) 05/02/2016   History reviewed. No pertinent surgical history.  Family History  Problem Relation Age of Onset  . Alzheimer's disease Mother   . Heart disease Father   . Peripheral Artery Disease Father   . Heart disease Brother     Medications- reviewed and updated Current Outpatient Prescriptions  Medication Sig Dispense Refill  . aspirin EC 81 MG tablet Take 81 mg by mouth daily.    [name] Kitchen atorvastatin (LIPITOR) 80 MG tablet TAKE 1 TABLET (80 MG TOTAL) BY MOUTH DAILY. 30 tablet 11  . carvedilol (COREG) 12.5 MG tablet TAKE 1 TABLET (12.5 MG TOTAL) BY MOUTH 2 (TWO) TIMES DAILY WITH A MEAL.  99  . cholecalciferol (VITAMIN D) 1000 units tablet Take 2,000 Units by mouth daily.    [name] Kitchen eplerenone (INSPRA) 25 MG tablet TAKE 1 TABLET BY MOUTH EVERY DAY 30 tablet 5  . furosemide (LASIX) 40 MG tablet TAKE 1 TABLET EVERY DAY 30 tablet 5  . glucose blood (CONTOUR NEXT TEST) test strip Use 4 times a day as needed to check blood sugar. 100 each 11  .  insulin aspart (NOVOLOG) 100 UNIT/ML injection Inject 5 Units into the skin 3 (three) times daily with meals. Takes 5-7 units with meals 30 mL 11  . insulin glargine (LANTUS) 100 UNIT/ML injection Inject 20-24 units twice daily. 30 mL 11  . Insulin Syringe-Needle U-100 (B-D INS SYR HALF-UNIT .3CC/31G) 31G X 5/16" 0.3 ML MISC Use 6 times a day 200 each 11  . metoCLOPramide (REGLAN) 10 MG  tablet Take 1 tablet (10 mg total) by mouth 4 (four) times daily. 120 tablet 11  . omeprazole (PRILOSEC) 40 MG capsule Take 1 capsule (40 mg total) by mouth 2 (two) times daily. 60 capsule 11  . valsartan (DIOVAN) 80 MG tablet Take 80 mg by mouth daily.  11   No current facility-administered medications for this visit.     Allergies-reviewed and updated Allergies  Allergen Reactions  . Codeine Nausea And Vomiting  . Penicillins Rash    Social History   Social History  . Marital status: Single    Spouse name: N/A  . Number of children: N/A  . Years of education: N/A   Social History Main Topics  . Smoking status: Never Smoker  . Smokeless tobacco: Never Used  . Alcohol use 0.6 oz/week    1 Cans of beer per week     Comment: occ  . Drug use: No  . Sexual activity: No   Other Topics Concern  . None   Social History Narrative  . None   Objective:  Physical Exam: BP 130/60 (BP Location: Right Arm, Patient Position: Sitting, Cuff Size: Normal)   Pulse 72   Temp 98.3 F (36.8 C) (Oral)   Ht 5\' 8"  (1.727 m)   Wt 184 lb (83.5 kg)   SpO2 97%   BMI 27.98 kg/m   Gen: NAD, resting comfortably CV: RRR with no murmurs appreciated Pulm: NWOB, CTAB with no crackles, wheezes, or rhonchi GI: Normal bowel sounds present. Soft, Nontender, Nondistended. MSK: no edema, cyanosis, or clubbing noted Skin: warm, dry Neuro: grossly normal, moves all extremities Psych: Normal affect and thought content  Assessment/Plan:  Coronary artery disease Stable. Check lipid panel today. Continue ASA, lipitor, coreg, eplerenone, lasix, and valsartan.   Hypertension associated with diabetes ([place]) At goal. Continue Coreg 12.5mg  bid, eplerenone 25mg  daily, lasix 40mg  daily, valsartan 80mg  daily. Check CMET.  Uncontrolled type 2 diabetes mellitus with hyperglycemia, with long-term current use of insulin ([name]) Will check A1c today. Continue current regimen of lantus 20-24U bid and novlog 5-7U  with meals. Consider addition of glp-1 agonist in the future if able to afford. Follow up in 3 months.   Hyperlipidemia associated with type 2 diabetes mellitus ([place]) Check lipid panel today. Continue atorvastatin.   Preventative Healthcare Check HIV and HCV status today. Flu vaccines given today. Will obtain prior records prior to giving pnuemonia vaccine.   [name]. [name], MD 04/14/2017 4:40 PM

## 2017-04-14 NOTE — Assessment & Plan Note (Signed)
Will check A1c today. Continue current regimen of lantus 20-24U bid and novlog 5-7U with meals. Consider addition of glp-1 agonist in the future if able to afford. Follow up in 3 months.

## 2017-04-14 NOTE — Assessment & Plan Note (Signed)
Check lipid panel today. Continue atorvastatin.  

## 2017-04-14 NOTE — Patient Instructions (Signed)
We will check blood work today.  I sent in refills.  No medication changes today.  Come back in 3 months.  Take care,  Dr [name]

## 2017-04-14 NOTE — Assessment & Plan Note (Signed)
At goal. Continue Coreg 12.5mg  bid, eplerenone 25mg  daily, lasix 40mg  daily, valsartan 80mg  daily. Check CMET.

## 2017-04-15 LAB — COMPREHENSIVE METABOLIC PANEL
ALT: 19 U/L (ref 0–53)
AST: 19 U/L (ref 0–37)
Albumin: 4.3 g/dL (ref 3.5–5.2)
Alkaline Phosphatase: 56 U/L (ref 39–117)
BUN: 19 mg/dL (ref 6–23)
CO2: 24 mEq/L (ref 19–32)
Calcium: 8.9 mg/dL (ref 8.4–10.5)
Chloride: 99 mEq/L (ref 96–112)
Creatinine, Ser: 1.01 mg/dL (ref 0.40–1.50)
GFR: 79.45 mL/min (ref >60.00)
Glucose, Bld: 280 mg/dL — ABNORMAL HIGH (ref 70–99)
Potassium: 4.5 mEq/L (ref 3.5–5.1)
Sodium: 134 mEq/L — ABNORMAL LOW (ref 135–145)
Total Bilirubin: 0.5 mg/dL (ref 0.2–1.2)
Total Protein: 6.8 g/dL (ref 6.0–8.3)

## 2017-04-15 LAB — LIPID PANEL
Cholesterol: 133 mg/dL (ref 0–200)
HDL: 35.8 mg/dL — ABNORMAL LOW (ref >39.00)
LDL Cholesterol: 61 mg/dL (ref 0–99)
NonHDL: 97.49
Total CHOL/HDL Ratio: 4
Triglycerides: 182 mg/dL — ABNORMAL HIGH (ref 0.0–149.0)
VLDL: 36.4 mg/dL (ref 0.0–40.0)

## 2017-04-15 LAB — CBC
HCT: 41.6 % (ref 39.0–52.0)
Hemoglobin: 14.2 g/dL (ref 13.0–17.0)
MCHC: 34.1 g/dL (ref 30.0–36.0)
MCV: 94.7 fl (ref 78.0–100.0)
Platelets: 332 10*3/uL (ref 150.0–400.0)
RBC: 4.39 Mil/uL (ref 4.22–5.81)
RDW: 13.5 % (ref 11.5–15.5)
WBC: 10.1 10*3/uL (ref 4.0–10.5)

## 2017-04-15 LAB — HEPATITIS C ANTIBODY: HCV Ab: NONREACTIVE

## 2017-04-15 LAB — HEMOGLOBIN A1C: Hgb A1c MFr Bld: 7.1 % — ABNORMAL HIGH (ref 4.6–6.5)

## 2017-04-15 LAB — HIV ANTIBODY (ROUTINE TESTING W REFLEX): HIV 1&2 Ab, 4th Generation: NONREACTIVE

## 2017-04-17 ENCOUNTER — Telehealth: Payer: Self-pay | Admitting: Family Medicine

## 2017-04-17 NOTE — Telephone Encounter (Signed)
Patient called in reference to needing results from previous lab work printed out to come pick up. Patient stated this is his 3rd attempt at getting these lab results. Please call patient and advise when ready to pick up. OK to leave message.

## 2017-04-21 NOTE — Telephone Encounter (Signed)
Pt is aware.  

## 2017-04-23 ENCOUNTER — Ambulatory Visit: Payer: BLUE CROSS/BLUE SHIELD | Admitting: Podiatry

## 2017-04-23 ENCOUNTER — Telehealth: Payer: Self-pay | Admitting: Family Medicine

## 2017-04-23 NOTE — Telephone Encounter (Signed)
ROI faxed to [facility]

## 2017-04-24 ENCOUNTER — Other Ambulatory Visit: Payer: BLUE CROSS/BLUE SHIELD

## 2017-04-29 ENCOUNTER — Ambulatory Visit: Payer: BLUE CROSS/BLUE SHIELD | Admitting: Endocrinology

## 2017-05-14 NOTE — Telephone Encounter (Signed)
Entered in error

## 2017-05-20 ENCOUNTER — Ambulatory Visit: Payer: BLUE CROSS/BLUE SHIELD | Admitting: Internal Medicine

## 2017-06-12 ENCOUNTER — Telehealth: Payer: Self-pay | Admitting: Family Medicine

## 2017-06-12 NOTE — Telephone Encounter (Signed)
MEDICATION: eplerenone 25mg  tab  PHARMACY:  [id number] [address]  IS THIS A 90 DAY SUPPLY : no  IS PATIENT OUT OF MEDICATION: yes  IF NOT; HOW MUCH IS LEFT: 0  LAST APPOINTMENT DATE: @9 /11/2016  NEXT APPOINTMENT DATE:@12 /11/2016  OTHER COMMENTS: Prescribed by old PCP.   **Let patient know to contact pharmacy at the end of the day to make sure medication is ready. **  ** Please notify patient to allow 48-72 hours to process**  **Encourage patient to contact the pharmacy for refills or they can request refills through [facility]**

## 2017-06-14 ENCOUNTER — Other Ambulatory Visit: Payer: Self-pay | Admitting: Internal Medicine

## 2017-06-15 MED ORDER — EPLERENONE 25 MG PO TABS: 25.0000 mg | ORAL_TABLET | Freq: Every day | ORAL | 5 refills | Status: DC

## 2017-06-15 NOTE — Addendum Note (Signed)
Addended by: Elio Forget on: 06/15/2017 12:00 PM   Modules accepted: Orders

## 2017-06-15 NOTE — Telephone Encounter (Signed)
Medication refilled for patient. 

## 2017-06-16 LAB — HM DIABETES EYE EXAM

## 2017-06-22 ENCOUNTER — Encounter: Payer: Self-pay | Admitting: Family Medicine

## 2017-07-13 NOTE — Telephone Encounter (Signed)
Another discontinued.  I'm so sorry, but I cannot get rid of these otherwise.  Thank you.

## 2017-07-14 ENCOUNTER — Encounter: Payer: Self-pay | Admitting: Family Medicine

## 2017-07-14 ENCOUNTER — Ambulatory Visit: Payer: BLUE CROSS/BLUE SHIELD | Admitting: Family Medicine

## 2017-07-14 VITALS — BP 113/67 | HR 59 | Ht 68.0 in | Wt 191.2 lb

## 2017-07-14 DIAGNOSIS — I10 Essential (primary) hypertension: Secondary | ICD-10-CM

## 2017-07-14 DIAGNOSIS — Z794 Long term (current) use of insulin: Secondary | ICD-10-CM | POA: Diagnosis not present

## 2017-07-14 DIAGNOSIS — I152 Hypertension secondary to endocrine disorders: Secondary | ICD-10-CM

## 2017-07-14 DIAGNOSIS — E1169 Type 2 diabetes mellitus with other specified complication: Secondary | ICD-10-CM | POA: Diagnosis not present

## 2017-07-14 DIAGNOSIS — E1159 Type 2 diabetes mellitus with other circulatory complications: Secondary | ICD-10-CM | POA: Diagnosis not present

## 2017-07-14 DIAGNOSIS — E1142 Type 2 diabetes mellitus with diabetic polyneuropathy: Secondary | ICD-10-CM

## 2017-07-14 DIAGNOSIS — E785 Hyperlipidemia, unspecified: Secondary | ICD-10-CM

## 2017-07-14 DIAGNOSIS — Z23 Encounter for immunization: Secondary | ICD-10-CM

## 2017-07-14 DIAGNOSIS — E1165 Type 2 diabetes mellitus with hyperglycemia: Secondary | ICD-10-CM

## 2017-07-14 LAB — POCT GLYCOSYLATED HEMOGLOBIN (HGB A1C): Hemoglobin A1C: 7

## 2017-07-14 NOTE — Addendum Note (Signed)
Addended by: Elio Forget on: 07/14/2017 02:04 PM   Modules accepted: Orders

## 2017-07-14 NOTE — Assessment & Plan Note (Signed)
Last LDL 61.  Continue atorvastatin 80 mg daily.

## 2017-07-14 NOTE — Assessment & Plan Note (Signed)
A1c 7.0 today.  Continue current regimen of Lantus 20 units in the morning, 25 units at night, NovoLog 5 units with meals.  Foot exam today performed and was normal.  Pneumonia vaccine given today as well.  Follow-up in 6 months.

## 2017-07-14 NOTE — Assessment & Plan Note (Signed)
At goal.  Continue current regimen of Coreg, eplerenone, Lasix, and valsartan.

## 2017-07-14 NOTE — Progress Notes (Signed)
    Subjective:  Jeffrey [name] is a 61 y.o. male who presents today with a chief complaint of diabetes follow-up.   HPI:  DIABETES Type II, established problem, Stable Medications: Lantus 20 units in the morning and 25 units at night, NovoLog 5 units 3 times daily with meals, Reports taking and tolerating without side effects. Blood Sugars per patient: Fasting: 180 or less, High: 225, Low: 30 Interim history: Patient reports one episode a couple of weeks ago with symptomatic low.  Thinks this was due to accidentally mixing up his NovoLog and Lantus.  Thinks he actually took 25 units of NovoLog.  Symptoms resolved after drinking several glasses of orange juice.  Otherwise has done well.  ROS: Denies Polyuria,Polydipsia,  Hypertension, established problem, Stable BP Readings from Last 3 Encounters:  07/14/17 113/67  04/14/17 130/60  04/09/17 126/74   Current Medications: Coreg 12.5 mg twice daily, eplerenone 25 mg daily, Lasix 40 mg daily, valsartan 80 mg daily, compliant without side effects.  ROS: Denies any chest pain, shortness of breath, dyspnea on exertion, leg edema.   Hyperlipidemia, established problem, Stable Current medication(s): Atorvastatin 80 mg daily.  Compliant without side effects.  ROS: No chest pain or shortness of breath. No myalgias.  ROS: Per HPI  PMH: Smoking history reviewed.  Never smoker.  Objective:  Physical Exam: BP 113/67   Pulse (!) 59   Ht 5\' 8"  (1.727 m)   Wt 191 lb 3.2 oz (86.7 kg)   SpO2 99%   BMI 29.07 kg/m   Gen: NAD, resting comfortably CV: RRR with no murmurs appreciated Pulm: NWOB, CTAB with no crackles, wheezes, or rhonchi GI: Normal bowel sounds present. Soft, Nontender, Nondistended. MSK: No edema, cyanosis, or clubbing noted.  Feet with decreased sensation to monofilament testing bilaterally.  Pulses intact. Skin: Warm, dry Neuro: Grossly normal, moves all extremities Psych: Normal affect and thought  content  Assessment/Plan:  Type 2 diabetes mellitus ([name]) A1c 7.0 today.  Continue current regimen of Lantus 20 units in the morning, 25 units at night, NovoLog 5 units with meals.  Foot exam today performed and was normal.  Pneumonia vaccine given today as well.  Follow-up in 6 months.  Hypertension associated with diabetes ([place]) At goal.  Continue current regimen of Coreg, eplerenone, Lasix, and valsartan.  Hyperlipidemia associated with type 2 diabetes mellitus ([name]) Last LDL 61.  Continue atorvastatin 80 mg daily.  [name]. [name], MD 07/14/2017 12:44 PM

## 2017-07-14 NOTE — Patient Instructions (Signed)
No changes today.  Keep up the good work.  I will plan on seeing you back in 6 months, or sooner as needed.  Take care,  Dr [name]

## 2017-07-27 ENCOUNTER — Telehealth: Payer: Self-pay | Admitting: Family Medicine

## 2017-07-27 MED ORDER — LOSARTAN POTASSIUM 100 MG PO TABS: 100.0000 mg | ORAL_TABLET | Freq: Every day | ORAL | 3 refills | Status: DC

## 2017-07-27 NOTE — Telephone Encounter (Signed)
Copied from [place] [id number]. Topic: Quick Communication - See Telephone Encounter >> Jul 27, 2017  1:10 PM Synthia Innocent wrote: CRM for notification. See Telephone encounter for:  Patient states that his BP Meds ,valsartan (DIOVAN) 80 MG tablet is recalled per his Pharmacy. Please advise. [facility] said that they have contacted [place].

## 2017-07-27 NOTE — Telephone Encounter (Signed)
Medication request; pt states that per his pharmacy, this medication is on backorder; See CRM # [phone]

## 2017-07-27 NOTE — Telephone Encounter (Signed)
Sent in losartan instead.  [name]. [name], MD 07/27/2017 3:39 PM

## 2017-07-28 ENCOUNTER — Telehealth: Payer: Self-pay | Admitting: Family Medicine

## 2017-07-28 NOTE — Telephone Encounter (Signed)
Please advise on the note below 

## 2017-07-28 NOTE — Telephone Encounter (Signed)
Pt called with questions about his new losartan medication; 1) does this work like plavix; 2) why was medication increased from 80 mg to 100 mg; he expresses concern that explained to pt that he took plavix and had l hemorrhage which required multiple eye surgeries, he also says that "losartan causes the blood vessels to relax and it sounds like plavix"; it was explained to pt that plavix and losartan are to different medication that act differently and are for different purposes; he then expresses concern as to why his medication was changed from 80 mg to 100 mg; it was further explained to the pt that different medications require different dosages; pt asks if these medications would cause him any problems like the eye problems he had before; explained to patient that taking any medication is not without risks; further explained to pt that if he has any problems or concerns related to his medication to please call back; pt verbalizes understanding; will route to [place] pool to make them aware of this encounter.

## 2017-07-28 NOTE — Telephone Encounter (Signed)
Called patient and informed him that medication has been sent to his pharmacy.

## 2017-07-28 NOTE — Telephone Encounter (Signed)
Noted.  Should the patient call back,   Plavix is an anticoagulant and is taken to prevent blood clots.  Losartan is an antihypertensive medication taken to lower high blood pressure.  Patient was prescribed Losartan because his usual BP med was on back order.  The dosage was different because two different medications require different doses.

## 2017-07-31 ENCOUNTER — Telehealth: Payer: Self-pay | Admitting: Cardiovascular Disease

## 2017-07-31 NOTE — Telephone Encounter (Signed)
New message  Patient calling to confirm it is safe for him to take losartan (COZAAR) 100 MG tablet.  Patient states he has had trouble in the past with medications such as Plavix, causing eye trouble. Please call   Pt c/o medication issue:  1. Name of Medication: Losartan 100  2. How are you currently taking this medication (dosage and times per day)? Take 1 tablet (100 mg total) by mouth daily.  3. Are you having a reaction (difficulty breathing--STAT)? NO  4. What is your medication issue? Patient wants to confirm medication safe to take and the increased dose of 100mg  from 80mg 

## 2017-07-31 NOTE — Telephone Encounter (Signed)
Returned call to patient Jeffrey [name]'s advice given.

## 2017-07-31 NOTE — Telephone Encounter (Signed)
I have never heard of ARBs causing ocular neuopathy

## 2017-07-31 NOTE — Telephone Encounter (Signed)
Returned call to patient he stated PCP started him on Losartan 100 mg daily in place of Valsartan which was recalled.Stated he had neuropathy with Plavix and he is concerned about Losartan.He read it relaxes blood vessels and he wanted to make sure it will not cause neuropathy in his eyes like Plavix did.Advised I will send message to our pharmacist.He also wanted Dr.Siren to be aware.Advised Dr.[place] is out of office, but I will send message to her.

## 2017-08-03 NOTE — Telephone Encounter (Signed)
This is completely unrelated to clopidogrel.

## 2017-08-05 NOTE — Telephone Encounter (Signed)
Spoke with pt, aware of dr Blenda Mounts recommendations.

## 2017-08-09 ENCOUNTER — Other Ambulatory Visit: Payer: Self-pay | Admitting: Internal Medicine

## 2017-08-25 ENCOUNTER — Ambulatory Visit: Payer: BLUE CROSS/BLUE SHIELD | Admitting: Family Medicine

## 2017-08-25 ENCOUNTER — Encounter: Payer: Self-pay | Admitting: Family Medicine

## 2017-08-25 VITALS — BP 122/72 | HR 67 | Temp 97.6°F | Ht 68.0 in | Wt 191.8 lb

## 2017-08-25 DIAGNOSIS — E1142 Type 2 diabetes mellitus with diabetic polyneuropathy: Secondary | ICD-10-CM

## 2017-08-25 DIAGNOSIS — E1159 Type 2 diabetes mellitus with other circulatory complications: Secondary | ICD-10-CM | POA: Diagnosis not present

## 2017-08-25 DIAGNOSIS — K219 Gastro-esophageal reflux disease without esophagitis: Secondary | ICD-10-CM | POA: Insufficient documentation

## 2017-08-25 DIAGNOSIS — Z794 Long term (current) use of insulin: Secondary | ICD-10-CM | POA: Diagnosis not present

## 2017-08-25 DIAGNOSIS — I10 Essential (primary) hypertension: Secondary | ICD-10-CM

## 2017-08-25 DIAGNOSIS — I152 Hypertension secondary to endocrine disorders: Secondary | ICD-10-CM

## 2017-08-25 MED ORDER — METOCLOPRAMIDE HCL 10 MG PO TABS: 10.0000 mg | ORAL_TABLET | Freq: Four times a day (QID) | ORAL | 3 refills | Status: DC

## 2017-08-25 MED ORDER — CARVEDILOL 12.5 MG PO TABS: ORAL_TABLET | ORAL | 3 refills | Status: DC

## 2017-08-25 MED ORDER — "INSULIN SYRINGE-NEEDLE U-100 31G X 5/16"" 0.3 ML MISC": 11 refills | Status: DC

## 2017-08-25 MED ORDER — OMEPRAZOLE 40 MG PO CPDR: 40.0000 mg | DELAYED_RELEASE_CAPSULE | Freq: Two times a day (BID) | ORAL | 3 refills | Status: DC

## 2017-08-25 NOTE — Progress Notes (Signed)
   Subjective:  Jeffrey [name] is a 62 y.o. male who presents today with a chief complaint of GERD.   HPI:  GERD, New Problem to this provider Stable on omeprazole for the past 5 years. Concerned about side effects - tried weaning off and was not able to tolerate very much.  He is concerned because his pharmacist told him that this would increase his risk for developing lupus among other possible adverse reactions.  He also takes Reglan as previously prescribed him by his GI doctor.  Hypertension, established problem, Stable BP Readings from Last 3 Encounters:  08/25/17 122/72  07/14/17 113/67  04/14/17 130/60  Current regimen includes Coreg 12.5 mg daily, eplerenone 25 mg daily, Lasix 40 mg daily, and losartan 100 mg daily.  He is compliant with these medications without side effects.  ROS: Denies any chest pain, shortness of breath, dyspnea on exertion, leg edema.   ROS: Per HPI  PMH: He reports that  has never smoked. he has never used smokeless tobacco. He reports that he drinks about 0.6 oz of alcohol per week. He reports that he does not use drugs.   Objective:  Physical Exam: BP 122/72 (BP Location: Left Arm, Patient Position: Sitting, Cuff Size: Normal)   Pulse 67   Temp 97.6 F (36.4 C) (Oral)   Ht 5\' 8"  (1.727 m)   Wt 191 lb 12.8 oz (87 kg)   SpO2 98%   BMI 29.16 kg/m   Gen: NAD, resting comfortably CV: RRR with no murmurs appreciated Pulm: NWOB, CTAB with no crackles, wheezes, or rhonchi GI: Normal bowel sounds present. Soft, Nontender, Nondistended.  Assessment/Plan:  GERD (gastroesophageal reflux disease) Discussed risk and benefits of prolonged PPI use with patient.  Patient with severe reflux symptoms that keep him awake at night when he does not take twice daily omeprazole.  We will continue with his current dose.  We will also continue with his Reglan.  Advised him in the future to try twice daily H2 blocker such as Pepcid or Zantac to see if he had adequate  control of his symptoms with either of these medications.  Hypertension associated with diabetes ([name]) At goal.  Refill for Coreg sent in.  Continue other medications including losartan, eplerenone, and Lasix.  Preventative healthcare Patient will check status of colonoscopy and let us know if he needs referral to have this done.  [name]. [name], MD 08/25/2017 12:06 PM

## 2017-08-25 NOTE — Assessment & Plan Note (Signed)
At goal.  Refill for Coreg sent in.  Continue other medications including losartan, eplerenone, and Lasix.

## 2017-08-25 NOTE — Patient Instructions (Signed)
It is okay for you to continue taking the medications you have been prescribed.  If you want to stop omeprazole, you can try taking either over-the-counter Zantac or Pepcid to see if this relieves her symptoms.  Your blood pressure looks great today.  We will not make any medication changes.  Please come back to see me for your scheduled appointment in June, or sooner as needed.  Take care, Dr. [name]

## 2017-08-25 NOTE — Assessment & Plan Note (Signed)
Discussed risk and benefits of prolonged PPI use with patient.  Patient with severe reflux symptoms that keep him awake at night when he does not take twice daily omeprazole.  We will continue with his current dose.  We will also continue with his Reglan.  Advised him in the future to try twice daily H2 blocker such as Pepcid or Zantac to see if he had adequate control of his symptoms with either of these medications.

## 2017-09-07 ENCOUNTER — Other Ambulatory Visit: Payer: Self-pay

## 2017-09-07 ENCOUNTER — Telehealth: Payer: Self-pay | Admitting: Family Medicine

## 2017-09-07 MED ORDER — "INSULIN SYRINGE-NEEDLE U-100 31G X 5/16"" 0.3 ML MISC": 11 refills | Status: DC

## 2017-09-07 NOTE — Telephone Encounter (Signed)
Copied from [place]. Topic: Quick Communication - See Telephone Encounter >> Sep 07, 2017 11:12 AM Ahmed Prima L wrote: CRM for notification. See Telephone encounter for:   09/07/17.   Insulin Syringe-Needle U-100 (B-D INS SYR HALF-UNIT .3CC/31G) 31G X 5/16" 0.3 ML MISC   He said the orders said only do 4 times a time & he is getting low. That only covers him only morning and night. He said he always checks at lunch and snacks too. He said he needs it changed back to the way he had it 6 times a day  [id number] [place], [employer] - [place]

## 2017-09-07 NOTE — Telephone Encounter (Signed)
Pt allow calling in stating the supply needs to be for 30 (one month) not 50 (one month and two weeks)

## 2017-09-07 NOTE — Telephone Encounter (Signed)
Rx sent for 6 times per day.

## 2017-09-15 ENCOUNTER — Telehealth: Payer: Self-pay | Admitting: Cardiovascular Disease

## 2017-09-15 NOTE — Telephone Encounter (Signed)
Spoke with bother and pt in great length . Pt had episode yesterday while vacuuming became lightheaded and attempted to go to bed but passed out prior and when came to called neighbor EMS was called and did complete work up and everything was normal. Pt complaining today of extreme fatigue .Disucssed with Dr Oval Linsey and will see pt tomorrow appt scheduled at 10:00 am. Pt aware of appt and instructed if has any further episodes to go to ED .  Pt verbalized understanding./cy

## 2017-09-15 NOTE — Telephone Encounter (Signed)
New Message   Pt c/o of Chest Pain: STAT if CP now or developed within 24 hours  1. Are you having CP right now? Yes   2. Are you experiencing any other symptoms (ex. SOB, nausea, vomiting, sweating)? No    3. How long have you been experiencing CP? Since yesterday  4. Is your CP continuous or coming and going? continous 5. Have you taken Nitroglycerin? No   Patients brother is calling on behalf of patient. He states that EMS was called on yesterday due to patient having chest pans and feeling like they are going to pass out. He states that the EMS indicated no issues on the EKG but his brother is still having pains. Please call to discuss. ?

## 2017-09-16 ENCOUNTER — Encounter: Payer: Self-pay | Admitting: Cardiovascular Disease

## 2017-09-16 ENCOUNTER — Ambulatory Visit: Payer: BLUE CROSS/BLUE SHIELD | Admitting: Cardiovascular Disease

## 2017-09-16 VITALS — BP 109/63 | HR 59 | Ht 67.5 in | Wt 191.6 lb

## 2017-09-16 DIAGNOSIS — R079 Chest pain, unspecified: Secondary | ICD-10-CM | POA: Diagnosis not present

## 2017-09-16 DIAGNOSIS — I119 Hypertensive heart disease without heart failure: Secondary | ICD-10-CM

## 2017-09-16 DIAGNOSIS — R55 Syncope and collapse: Secondary | ICD-10-CM

## 2017-09-16 DIAGNOSIS — E7800 Pure hypercholesterolemia, unspecified: Secondary | ICD-10-CM

## 2017-09-16 MED ORDER — CARVEDILOL 6.25 MG PO TABS: 6.2500 mg | ORAL_TABLET | Freq: Two times a day (BID) | ORAL | 5 refills | Status: DC

## 2017-09-16 NOTE — Patient Instructions (Addendum)
Medication Instructions:  DECREASE YOUR CARVEDILOL TO 12.5 MG 1/2 TABLET TWICE A DAY UNTIL YOU FINISH YOUR CURRENT BOTTLE. A NEW PRESCRIPTION HAS BEEN SENT TO THE PHARMACY WHEN YOU PICK UP IT WILL BE FOR THE 6.25 MG TABLETS   Labwork: none  Testing/Procedures: Your physician has requested that you have a lexiscan myoview. For further information please visit [email or web address]. Please follow instruction sheet, as given.  Your physician has requested that you have an echocardiogram. Echocardiography is a painless test that uses sound waves to create images of your heart. It provides your doctor with information about the size and shape of your heart and how well your heart's chambers and valves are working. This procedure takes approximately one hour. There are no restrictions for this procedure. [place] [address]  Follow-Up: Your physician recommends that you schedule a follow-up appointment in: WITH PA/NP IN 1 MONTH   Your physician recommends that you schedule a follow-up appointment in: 3-4 MONTHS WITH DR [facility]   If you need a refill on your cardiac medications before your next appointment, please call your pharmacy.   MONITOR YOUR BLOOD PRESSURE AT HOME TWICE A DAY AND BRING WITH YOU TO YOUR FOLLOW UP   Echocardiogram An echocardiogram, or echocardiography, uses sound waves (ultrasound) to produce an image of your heart. The echocardiogram is simple, painless, obtained within a short period of time, and offers valuable information to your health care provider. The images from an echocardiogram can provide information such as:  Evidence of coronary artery disease (CAD).  Heart size.  Heart muscle function.  Heart valve function.  Aneurysm detection.  Evidence of a past heart attack.  Fluid buildup around the heart.  Heart muscle thickening.  Assess heart valve function.  Tell a health care provider about:  Any allergies you have.  All  medicines you are taking, including vitamins, herbs, eye drops, creams, and over-the-counter medicines.  Any problems you or family members have had with anesthetic medicines.  Any blood disorders you have.  Any surgeries you have had.  Any medical conditions you have.  Whether you are pregnant or may be pregnant. What happens before the procedure? No special preparation is needed. Eat and drink normally. What happens during the procedure?  In order to produce an image of your heart, gel will be applied to your chest and a wand-like tool (transducer) will be moved over your chest. The gel will help transmit the sound waves from the transducer. The sound waves will harmlessly bounce off your heart to allow the heart images to be captured in real-time motion. These images will then be recorded.  You may need an IV to receive a medicine that improves the quality of the pictures. What happens after the procedure? You may return to your normal schedule including diet, activities, and medicines, unless your health care provider tells you otherwise. This information is not intended to replace advice given to you by your health care provider. Make sure you discuss any questions you have with your health care provider. Document Released: 07/25/2000 Document Revised: 03/15/2016 Document Reviewed: 04/04/2013 Elsevier Interactive Patient Education  2017 [place].  Cardiac Nuclear Scan A cardiac nuclear scan is a test that measures blood flow to the heart when a person is resting and when he or she is exercising. The test looks for problems such as:  Not enough blood reaching a portion of the heart.  The heart muscle not working normally.  You may  need this test if:  You have heart disease.  You have had abnormal lab results.  You have had heart surgery or angioplasty.  You have chest pain.  You have shortness of breath.  In this test, a radioactive dye (tracer) is injected into  your bloodstream. After the tracer has traveled to your heart, an imaging device is used to measure how much of the tracer is absorbed by or distributed to various areas of your heart. This procedure is usually done at a hospital and takes 2-4 hours. Tell a health care provider about:  Any allergies you have.  All medicines you are taking, including vitamins, herbs, eye drops, creams, and over-the-counter medicines.  Any problems you or family members have had with the use of anesthetic medicines.  Any blood disorders you have.  Any surgeries you have had.  Any medical conditions you have.  Whether you are pregnant or may be pregnant. What are the risks? Generally, this is a safe procedure. However, problems may occur, including:  Serious chest pain and heart attack. This is only a risk if the stress portion of the test is done.  Rapid heartbeat.  Sensation of warmth in your chest. This usually passes quickly.  What happens before the procedure?  Ask your health care provider about changing or stopping your regular medicines. This is especially important if you are taking diabetes medicines or blood thinners.  Remove your jewelry on the day of the procedure. What happens during the procedure?  An IV tube will be inserted into one of your veins.  Your health care provider will inject a small amount of radioactive tracer through the tube.  You will wait for 20-40 minutes while the tracer travels through your bloodstream.  Your heart activity will be monitored with an electrocardiogram (ECG).  You will lie down on an exam table.  Images of your heart will be taken for about 15-20 minutes.  You may be asked to exercise on a treadmill or stationary bike. While you exercise, your heart's activity will be monitored with an ECG, and your blood pressure will be checked. If you are unable to exercise, you may be given a medicine to increase blood flow to parts of your heart.  When  blood flow to your heart has peaked, a tracer will again be injected through the IV tube.  After 20-40 minutes, you will get back on the exam table and have more images taken of your heart.  When the procedure is over, your IV tube will be removed. The procedure may vary among health care providers and hospitals. Depending on the type of tracer used, scans may need to be repeated 3-4 hours later. What happens after the procedure?  Unless your health care provider tells you otherwise, you may return to your normal schedule, including diet, activities, and medicines.  Unless your health care provider tells you otherwise, you may increase your fluid intake. This will help flush the contrast dye from your body. Drink enough fluid to keep your urine clear or pale yellow.  It is up to you to get your test results. Ask your health care provider, or the department that is doing the test, when your results will be ready. Summary  A cardiac nuclear scan measures the blood flow to the heart when a person is resting and when he or she is exercising.  You may need this test if you are at risk for heart disease.  Tell your health care provider if  you are pregnant.  Unless your health care provider tells you otherwise, increase your fluid intake. This will help flush the contrast dye from your body. Drink enough fluid to keep your urine clear or pale yellow. This information is not intended to replace advice given to you by your health care provider. Make sure you discuss any questions you have with your health care provider. Document Released: 08/22/2004 Document Revised: 07/30/2016 Document Reviewed: 07/06/2013 Elsevier Interactive Patient Education  2017 [employer].

## 2017-09-16 NOTE — Progress Notes (Signed)
Cardiology Office Note   Date:  09/16/2017   ID:  Quame Spratlin, DOB [birth year], MRN [id number]  PCP:  Vivi Barrack, MD  Cardiologist:   Skeet Latch, MD   Chief Complaint  Patient presents with  . Follow-up      History of Present Illness: Mcclain Shall is a 62 y.o. male with hypertension, hyperlipidemia, diabetes and peripheral neuropathy who presents for follow up.  Mr. Douty recently moved to [place] from [place].  He moved here with his identical twin brother.  Mr. Gladwin had a cardiac arrest 11/23/13 and underwent PCI of the LAD.  Echo 10/2015 revealed an anteroseptal and apical septal infarct with associated hypokinesis.  LVEF was 55%.  He had a PET CT/myocardial perfusion scan 10/2015 that revealed a moderate sized, moderate intensity antero-apical infarct with mild ischemia.  At the end of 2016 he had an episode of stabbing chest pain.  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  At that time his clopidogrel was discontinued and he underwent several laser procedures.  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    At his last appointment Mr. Dennington reported fatigue and dizziness. Valsartan and carvedilol doses were both reduced.  At the time he had lost 10lb by changing his diet.  At that appointment he also complained of chest discomfort that seemed most consistent with GERD.  He was referred for carotid Dopplers but decided not to get it because his symptoms have improved.  After a week he started getting pain in his eyes.  He previously had laser surgery for bleeding in his R eye.  He attributed this to his blood pressure being too high.  He resumed his prior doses of valsartan and carvedilol.  He has been feeling well and the dizziness has improved.  He has been very concerned about his brother who was recently diagnosed with skin cancer.   Last  Saturday he went for a walk with friends.  He was short of breath and unable to keep up with the group.  A neighbor had to drive him home.  He relaxed the following day.  Yesterday he was doing housework.  When he bent over to pick up the vaccuum and started feeling lightheaded.  He felt discomfort in his chest.  He called EMS and BP was 169/80.  He was reportedly white as a ghost and looked poorly. He noted nausea and diaphoresis.  His blood glucose was normal.  They advised him to go to the ED but he declined due to cost concerns. Since that time he as been feeling tired and the sensation of a bubble in his chest.  This has been ongoing intermittently for two weeks.  Lately it has been more consistent.  He gets short of breath with minimal exewrtion.  His brother has to do all the shopping.  He denies orthopnea or PND.  He is unable to lay flat in a bed due to productive cough.  He has been working with his PCP on this.     Past Medical History:  Diagnosis Date  . Cardiac arrest ([place]) 05/22/2016  . CHF (congestive heart failure) ([place])   . Diabetes mellitus without complication ([place])   . Hyperlipidemia   . Hypertension   . Myocardial infarct ([place]) 2105  . S/P primary angioplasty with coronary stent 05/22/2016  History reviewed. No pertinent surgical history.   Current Outpatient Medications  Medication Sig Dispense Refill  . aspirin EC 81 MG tablet Take 81 mg by mouth daily.    [name] Kitchen atorvastatin (LIPITOR) 80 MG tablet TAKE 1 TABLET (80 MG TOTAL) BY MOUTH DAILY. 30 tablet 11  . carvedilol (COREG) 6.25 MG tablet Take 1 tablet (6.25 mg total) by mouth 2 (two) times daily with a meal. 60 tablet 5  . cholecalciferol (VITAMIN D) 1000 units tablet Take 2,000 Units by mouth daily.    [name] Kitchen eplerenone (INSPRA) 25 MG tablet Take 1 tablet (25 mg total) daily by mouth. 30 tablet 5  . furosemide (LASIX) 40 MG tablet TAKE 1 TABLET EVERY DAY 30 tablet 5  . glucose blood (CONTOUR NEXT TEST) test strip Use 4  times a day as needed to check blood sugar. 100 each 11  . insulin aspart (NOVOLOG) 100 UNIT/ML injection Inject 5 Units into the skin 3 (three) times daily with meals. Takes 5-7 units with meals 30 mL 11  . insulin glargine (LANTUS) 100 UNIT/ML injection Inject 20-24 units twice daily. 30 mL 11  . Insulin Syringe-Needle U-100 (B-D INS SYR HALF-UNIT .3CC/31G) 31G X 5/16" 0.3 ML MISC Use 6 times a day 200 each 11  . losartan (COZAAR) 100 MG tablet Take 1 tablet (100 mg total) by mouth daily. 90 tablet 3  . metoCLOPramide (REGLAN) 10 MG tablet Take 1 tablet (10 mg total) by mouth 4 (four) times daily. 360 tablet 3  . omeprazole (PRILOSEC) 40 MG capsule Take 1 capsule (40 mg total) by mouth 2 (two) times daily. 180 capsule 3   No current facility-administered medications for this visit.     Allergies:   Codeine and Penicillins    Social History:  The patient  reports that  has never smoked. he has never used smokeless tobacco. He reports that he drinks about 0.6 oz of alcohol per week. He reports that he does not use drugs.   Family History:  The patient's family history includes Alzheimer's disease in his mother; Heart disease in his brother and father; Peripheral Artery Disease in his father.    ROS:  Please see the history of present illness.   Otherwise, review of systems are positive for cold hands.   All other systems are reviewed and negative.    PHYSICAL EXAM: VS:  BP 109/63   Pulse (!) 59   Ht 5' 7.5" (1.715 m)   Wt 191 lb 9.6 oz (86.9 kg)   BMI 29.57 kg/m  , BMI Body mass index is 29.57 kg/m. GENERAL:  Well appearing.  No acute distress HEENT: Pupils equal round and reactive, fundi not visualized, oral mucosa unremarkable NECK:  No jugular venous distention, waveform within normal limits, carotid upstroke brisk and symmetric, no bruits, no thyromegaly LYMPHATICS:  No cervical adenopathy LUNGS:  Clear to auscultation bilaterally HEART:  RRR.  PMI not displaced or sustained,S1  and S2 within normal limits, no S3, no S4, no clicks, no rubs, no murmurs ABD:  Flat, positive bowel sounds normal in frequency in pitch, no bruits, no rebound, no guarding, no midline pulsatile mass, no hepatomegaly, no splenomegaly EXT:  2 plus pulses throughout, no edema, no cyanosis no clubbing SKIN:  No rashes no nodules NEURO:  Cranial nerves II through XII grossly intact, motor grossly intact throughout PSYCH:  Cognitively intact, oriented to person place and time.  Depressed affect   EKG:  EKG is ordered today. The ekg ordered 05/22/16  demonstrates sinus rhythm rate 63 bpm.  Prior anteroseptal infarct.  LAFB 11/18/16: Sinus rhythm.  Rate 64 bpm.  Low voltage limb and precordial leads 09/16/17: Sinus bradycardia rate 59 bpm.    Echo 11/01/15: LVEF >55%.  Mild concentric LVH. Mild anteroseptal and apical septal hypokinesis. Normal RV function.  Recent Labs: 04/14/2017: ALT 19; BUN 19; Creatinine, Ser 1.01; Hemoglobin 14.2; Platelets 332.0; Potassium 4.5; Sodium 134    Lipid Panel    Component Value Date/Time   CHOL 133 04/14/2017 1523   TRIG 182.0 (H) 04/14/2017 1523   HDL 35.80 (L) 04/14/2017 1523   CHOLHDL 4 04/14/2017 1523   VLDL 36.4 04/14/2017 1523   LDLCALC 61 04/14/2017 1523    09/07/15: Sodium 138, potassium 4.5, BUN 20, creatinine 1.02 AST 17, ALT 24 Total cholesterol 113, triglycerides 146, HDL 32, LDL 52 TSH 1.76 WBC 11.7, hemoglobin 13.4, hematocrit 40.2, platelets 317 Hemoglobin A1c 8.9 on a relatively dry%  Wt Readings from Last 3 Encounters:  09/16/17 191 lb 9.6 oz (86.9 kg)  08/25/17 191 lb 12.8 oz (87 kg)  07/14/17 191 lb 3.2 oz (86.7 kg)     ASSESSMENT AND PLAN:  # Syncope:  Mr. Erven had an episode of syncope that sounds orthostatic.  He was frankly orthostatic in clinic today.  He reports that his blood pressure was elevated when EMS arrived.  Given his orthostatic drop in blood pressure,  low blood pressure at baseline we will reduce carvedilol to  6.25 mg twice daily.  Continue losartan and eplerenone.  He will get a blood pressure monitor and check his blood pressure twice daily at home.  We may need to reduce losartan if his BP remains low.   If his EF is not low then eplerenone can be discontinued.  # CAD s/p cardiac arrest and LAD PCI: Mr. Lage reports episodes of atypical chest pain and some exertional chest pain.  Given his history we will get a [employer].  Has been ordered in the past and he did not pursue it due to financial reasons.  He is now interested in pursuing testing.  Continue aspirin, atorvastatin, and reduce carvedilol as above.  Given his exertional dyspnea intermittent edema we will also get an echo.  # Hypertension:  Blood pressure is low today.  Reduce carvedilol as above.  Continue losartan and eplerenone.  # Hyperlipidemia.  LDL 81 on 04/14/17.  Lovastatin  # Depression: Mr. Vallery reports generally decreased energy and has traumatic dreams and thoughts related to his childhood.  I suspect that depression and anxiety are contributing to his adhedonia and lack of desire to leave the home.  I suggested that he follow-up with his PCP and psychiatrist.  Current medicines are reviewed at length with the patient today.  The patient does not have concerns regarding medicines.  The following changes have been made:  Reduce carvedilol to 6.25mg  bid.  Labs/ tests ordered today include:   Orders Placed This Encounter  Procedures  . MYOCARDIAL PERFUSION IMAGING  . EKG 12-Lead  . ECHOCARDIOGRAM COMPLETE    Time spent: 40 minutes-Greater than 50% of this time was spent in counseling, explanation of diagnosis, planning of further management, and coordination of care.  Disposition:   FU with Tomislav Micale C. Oval Linsey, MD, [facility] in 3 months.  APP in 1 months    This note was written with the assistance of speech recognition software.  Please excuse any transcriptional errors.  Signed, Dean Wonder C. Oval Linsey, MD, [facility]    09/16/2017  11:33 AM    [facility]

## 2017-09-28 ENCOUNTER — Other Ambulatory Visit: Payer: Self-pay

## 2017-09-28 ENCOUNTER — Ambulatory Visit (HOSPITAL_COMMUNITY): Payer: BLUE CROSS/BLUE SHIELD | Attending: Cardiovascular Disease

## 2017-09-28 DIAGNOSIS — R55 Syncope and collapse: Secondary | ICD-10-CM

## 2017-09-28 DIAGNOSIS — E119 Type 2 diabetes mellitus without complications: Secondary | ICD-10-CM | POA: Diagnosis not present

## 2017-09-28 DIAGNOSIS — R42 Dizziness and giddiness: Secondary | ICD-10-CM | POA: Insufficient documentation

## 2017-09-28 DIAGNOSIS — Z955 Presence of coronary angioplasty implant and graft: Secondary | ICD-10-CM | POA: Insufficient documentation

## 2017-09-28 DIAGNOSIS — I252 Old myocardial infarction: Secondary | ICD-10-CM | POA: Diagnosis not present

## 2017-09-28 DIAGNOSIS — I509 Heart failure, unspecified: Secondary | ICD-10-CM | POA: Diagnosis not present

## 2017-09-28 DIAGNOSIS — I351 Nonrheumatic aortic (valve) insufficiency: Secondary | ICD-10-CM | POA: Diagnosis not present

## 2017-09-28 DIAGNOSIS — I110 Hypertensive heart disease with heart failure: Secondary | ICD-10-CM | POA: Diagnosis not present

## 2017-09-28 DIAGNOSIS — Z8249 Family history of ischemic heart disease and other diseases of the circulatory system: Secondary | ICD-10-CM | POA: Insufficient documentation

## 2017-09-28 DIAGNOSIS — R079 Chest pain, unspecified: Secondary | ICD-10-CM | POA: Diagnosis present

## 2017-09-28 DIAGNOSIS — E785 Hyperlipidemia, unspecified: Secondary | ICD-10-CM | POA: Insufficient documentation

## 2017-09-30 ENCOUNTER — Telehealth (HOSPITAL_COMMUNITY): Payer: Self-pay

## 2017-09-30 NOTE — Telephone Encounter (Signed)
Encounter complete. 

## 2017-10-02 ENCOUNTER — Ambulatory Visit (HOSPITAL_COMMUNITY)
Admission: RE | Admit: 2017-10-02 | Discharge: 2017-10-02 | Disposition: A | Discharge status: Home / Self Care | Payer: BLUE CROSS/BLUE SHIELD | Source: Ambulatory Visit | Attending: Cardiology | Admitting: Cardiology

## 2017-10-02 DIAGNOSIS — R079 Chest pain, unspecified: Secondary | ICD-10-CM | POA: Insufficient documentation

## 2017-10-02 DIAGNOSIS — R55 Syncope and collapse: Secondary | ICD-10-CM | POA: Insufficient documentation

## 2017-10-02 LAB — MYOCARDIAL PERFUSION IMAGING
LV dias vol: 116 mL (ref 62–150)
LV sys vol: 60 mL
Peak HR: 97 {beats}/min
Rest HR: 58 {beats}/min
SDS: 4
SRS: 6
SSS: 8
TID: 0.87

## 2017-10-02 MED ORDER — REGADENOSON 0.4 MG/5ML IV SOLN
0.4000 mg | Freq: Once | INTRAVENOUS | Status: AC
  Administered 2017-10-02: 0.4 mg via INTRAVENOUS

## 2017-10-02 MED ORDER — TECHNETIUM TC 99M TETROFOSMIN IV KIT
10.1000 | PACK | Freq: Once | INTRAVENOUS | Status: AC | PRN
  Administered 2017-10-02: 10.1 via INTRAVENOUS
  Filled 2017-10-02: qty 11

## 2017-10-02 MED ORDER — TECHNETIUM TC 99M TETROFOSMIN IV KIT
30.7000 | PACK | Freq: Once | INTRAVENOUS | Status: AC | PRN
  Administered 2017-10-02: 30.7 via INTRAVENOUS
  Filled 2017-10-02: qty 31

## 2017-10-08 ENCOUNTER — Ambulatory Visit: Payer: BLUE CROSS/BLUE SHIELD | Admitting: Cardiovascular Disease

## 2017-10-14 ENCOUNTER — Encounter: Payer: Self-pay | Admitting: Cardiology

## 2017-10-14 ENCOUNTER — Ambulatory Visit: Payer: BLUE CROSS/BLUE SHIELD | Admitting: Cardiology

## 2017-10-14 DIAGNOSIS — E118 Type 2 diabetes mellitus with unspecified complications: Secondary | ICD-10-CM

## 2017-10-14 DIAGNOSIS — Z794 Long term (current) use of insulin: Secondary | ICD-10-CM | POA: Diagnosis not present

## 2017-10-14 DIAGNOSIS — Z9861 Coronary angioplasty status: Secondary | ICD-10-CM | POA: Diagnosis not present

## 2017-10-14 DIAGNOSIS — T733XXA Exhaustion due to excessive exertion, initial encounter: Secondary | ICD-10-CM | POA: Insufficient documentation

## 2017-10-14 DIAGNOSIS — I2510 Atherosclerotic heart disease of native coronary artery without angina pectoris: Secondary | ICD-10-CM

## 2017-10-14 DIAGNOSIS — IMO0001 Reserved for inherently not codable concepts without codable children: Secondary | ICD-10-CM

## 2017-10-14 DIAGNOSIS — E785 Hyperlipidemia, unspecified: Secondary | ICD-10-CM | POA: Diagnosis not present

## 2017-10-14 DIAGNOSIS — T733XXD Exhaustion due to excessive exertion, subsequent encounter: Secondary | ICD-10-CM | POA: Diagnosis not present

## 2017-10-14 NOTE — Progress Notes (Signed)
10/14/2017 [name]   1955/11/14  [id number]  Primary Physician Vivi Barrack, MD Primary Cardiologist: Dr Oval Linsey  HPI:  62 y/o male, here with his twin brother, for follow up after recent medication adjustments and recent OP testing. Jeffrey [name] has a history of a cardiac arrest and had a cath/ PCI to his LAD in [place]. He has never had a f/u cath. He recently saw Dr Oval Linsey and had complained of weakness and was found to be orthostatic. His medications were cut back and he was instructed to keep a log of his B/P at home. He was also set up for an echo and Myoview. His echo showed his EF to be 45-50%. His Myoview was low risk.   The pt and his brother misunderstood the results. They though his EF should be "100%" and they were quite concerned. The patient says he is unable to do anything since his heart attack. He generally doesn't leave the house. He showers one day, then shaves the next because it "takes too much out of me". He says he tried vacuuming the other day and almost collapsed with SOB and weakness. He denies chest pain. Since Dr Oval Linsey adjusted his medications his B/P at home has been stable 338-250 systolic, HR 53-97.     Current Outpatient Medications  Medication Sig Dispense Refill  . aspirin EC 81 MG tablet Take 81 mg by mouth daily.    [name] Kitchen atorvastatin (LIPITOR) 80 MG tablet TAKE 1 TABLET (80 MG TOTAL) BY MOUTH DAILY. 30 tablet 11  . carvedilol (COREG) 6.25 MG tablet Take 1 tablet (6.25 mg total) by mouth 2 (two) times daily with a meal. 60 tablet 5  . cholecalciferol (VITAMIN D) 1000 units tablet Take 2,000 Units by mouth daily.    [name] Kitchen eplerenone (INSPRA) 25 MG tablet Take 1 tablet (25 mg total) daily by mouth. 30 tablet 5  . furosemide (LASIX) 40 MG tablet TAKE 1 TABLET EVERY DAY 30 tablet 5  . glucose blood (CONTOUR NEXT TEST) test strip Use 4 times a day as needed to check blood sugar. 100 each 11  . insulin aspart (NOVOLOG) 100 UNIT/ML injection Inject 5 Units  into the skin 3 (three) times daily with meals. Takes 5-7 units with meals 30 mL 11  . insulin glargine (LANTUS) 100 UNIT/ML injection Inject 20-24 units twice daily. 30 mL 11  . Insulin Syringe-Needle U-100 (B-D INS SYR HALF-UNIT .3CC/31G) 31G X 5/16" 0.3 ML MISC Use 6 times a day 200 each 11  . losartan (COZAAR) 100 MG tablet Take 1 tablet (100 mg total) by mouth daily. 90 tablet 3  . metoCLOPramide (REGLAN) 10 MG tablet Take 1 tablet (10 mg total) by mouth 4 (four) times daily. 360 tablet 3  . omeprazole (PRILOSEC) 40 MG capsule Take 1 capsule (40 mg total) by mouth 2 (two) times daily. 180 capsule 3   No current facility-administered medications for this visit.     Allergies  Allergen Reactions  . Codeine Nausea And Vomiting  . Penicillins Rash    Past Medical History:  Diagnosis Date  . Cardiac arrest ([place]) 05/22/2016  . CHF (congestive heart failure) ([place])   . Diabetes mellitus without complication ([place])   . Hyperlipidemia   . Hypertension   . Myocardial infarct ([place]) 2105  . S/P primary angioplasty with coronary stent 05/22/2016    Social History   Socioeconomic History  . Marital status: Single    Spouse name: Not on file  .  Number of children: Not on file  . Years of education: Not on file  . Highest education level: Not on file  Social Needs  . Financial resource strain: Not on file  . Food insecurity - worry: Not on file  . Food insecurity - inability: Not on file  . Transportation needs - medical: Not on file  . Transportation needs - non-medical: Not on file  Occupational History  . Not on file  Tobacco Use  . Smoking status: Never Smoker  . Smokeless tobacco: Never Used  Substance and Sexual Activity  . Alcohol use: Yes    Alcohol/week: 0.6 oz    Types: 1 Cans of beer per week    Comment: occ  . Drug use: No  . Sexual activity: No  Other Topics Concern  . Not on file  Social History Narrative  . Not on file     Family History  Problem Relation  Age of Onset  . Alzheimer's disease Mother   . Heart disease Father   . Peripheral Artery Disease Father   . Heart disease Brother      Review of Systems: General: negative for chills, fever, night sweats or weight changes.  Cardiovascular: negative for chest pain, dyspnea on exertion, edema, orthopnea, palpitations, paroxysmal nocturnal dyspnea or shortness of breath Dermatological: negative for rash Respiratory: negative for cough or wheezing Urologic: negative for hematuria Abdominal: negative for nausea, vomiting, diarrhea, bright red blood per rectum, melena, or hematemesis Neurologic: negative for visual changes, syncope, or dizziness All other systems reviewed and are otherwise negative except as noted above.    Blood pressure 114/62, pulse 76, height 5\' 8"  (1.727 m), weight 191 lb (86.6 kg), SpO2 97 %.  General appearance: alert, cooperative and no distress Neck: no carotid bruit and no JVD Lungs: clear to auscultation bilaterally Heart: regular rate and rhythm Extremities: extremities normal, atraumatic, no cyanosis or edema Skin: Skin color, texture, turgor normal. No rashes or lesions Neurologic: Grossly normal   ASSESSMENT AND PLAN:   CAD S/P percutaneous coronary angioplasty S/P cardiac arrest in 2015 in [place]- treated with LAD PCI/ stent  Dyslipidemia Pt is on high dose statin Rx  Insulin dependent diabetes mellitus with complications ([name]) Pt had retinal hemorrhages after his PCI and had to be taken of Plavix.  Fatigue due to exertion Pt has exertional fatigue   PLAN  I explained his Myoview and echo results in detail with the patient and his brother. Once they understood that the results actually looked OK they were relieved. I explained that this doesn't answer the question of why he can't do anything without having SOB or fatigue. I suggested with the pt's diabetes it may take an angiogram to determine with closer to 100% accuracy wether or not he had  progression of his CAD. He is not interested in this now secondary to financial concerns. I told them I would review the above with Dr Oval Linsey for her input as well.   Kerin Ransom PA-C 10/14/2017 4:51 PM

## 2017-10-14 NOTE — Assessment & Plan Note (Signed)
Pt had retinal hemorrhages after his PCI and had to be taken of Plavix.

## 2017-10-14 NOTE — Assessment & Plan Note (Signed)
Pt is on high dose statin Rx

## 2017-10-14 NOTE — Assessment & Plan Note (Signed)
S/P cardiac arrest in 2015 in [place]- treated with LAD PCI/ stent

## 2017-10-14 NOTE — Assessment & Plan Note (Signed)
Pt has exertional fatigue

## 2017-10-14 NOTE — Patient Instructions (Signed)
Medication Instructions:  Your physician recommends that you continue on your current medications as directed. Please refer to the Current Medication list given to you today.  Labwork: None   Testing/Procedures: None   Follow-Up: Keep upcoming appointment with Dr Oval Linsey as scheduled;  Give the office a call if you have any new symptoms .  Any Other Special Instructions Will Be Listed Below (If Applicable). If you need a refill on your cardiac medications before your next appointment, please call your pharmacy.

## 2017-11-20 ENCOUNTER — Ambulatory Visit (INDEPENDENT_AMBULATORY_CARE_PROVIDER_SITE_OTHER): Payer: BLUE CROSS/BLUE SHIELD | Admitting: Family Medicine

## 2017-11-20 ENCOUNTER — Encounter: Payer: Self-pay | Admitting: Family Medicine

## 2017-11-20 VITALS — BP 130/78 | HR 74 | Temp 98.7°F | Ht 68.0 in | Wt 190.0 lb

## 2017-11-20 DIAGNOSIS — IMO0001 Reserved for inherently not codable concepts without codable children: Secondary | ICD-10-CM

## 2017-11-20 DIAGNOSIS — E1159 Type 2 diabetes mellitus with other circulatory complications: Secondary | ICD-10-CM

## 2017-11-20 DIAGNOSIS — Z794 Long term (current) use of insulin: Secondary | ICD-10-CM

## 2017-11-20 DIAGNOSIS — E118 Type 2 diabetes mellitus with unspecified complications: Secondary | ICD-10-CM | POA: Diagnosis not present

## 2017-11-20 DIAGNOSIS — E785 Hyperlipidemia, unspecified: Secondary | ICD-10-CM

## 2017-11-20 DIAGNOSIS — I10 Essential (primary) hypertension: Secondary | ICD-10-CM

## 2017-11-20 DIAGNOSIS — I152 Hypertension secondary to endocrine disorders: Secondary | ICD-10-CM

## 2017-11-20 LAB — POCT GLYCOSYLATED HEMOGLOBIN (HGB A1C): Hemoglobin A1C: 7.4

## 2017-11-20 MED ORDER — ATORVASTATIN CALCIUM 80 MG PO TABS: 80.0000 mg | ORAL_TABLET | Freq: Every day | ORAL | 11 refills | Status: DC

## 2017-11-20 MED ORDER — METFORMIN HCL ER 500 MG PO TB24: 1000.0000 mg | ORAL_TABLET | Freq: Every day | ORAL | 11 refills | Status: DC

## 2017-11-20 MED ORDER — FUROSEMIDE 40 MG PO TABS: 40.0000 mg | ORAL_TABLET | Freq: Every day | ORAL | 11 refills | Status: DC

## 2017-11-20 NOTE — Assessment & Plan Note (Signed)
Patient is very concerned about the cost of his diabetes medications once he switches insurance.  It is not clear to me why he has never been on metformin in the past.  We will start metformin extended release 1000 mg daily.  In the meantime, we will continue with Lantus 20 units twice daily.  Instructed patient to check his blood sugar 3 times daily before meals.  If it is more than 150, he should take 5 units of NovoLog.  If it is less than 150 he should skip the NovoLog dose.  He will follow-up with me in a few weeks.  We will push his dose of metformin as tolerated.  I also gave a list of other diabetes medications and alternatives to the Lantus/NovoLog to look to see how much they would cost in the future.

## 2017-11-20 NOTE — Assessment & Plan Note (Signed)
Continue atorvastatin 80 mg daily. 

## 2017-11-20 NOTE — Progress Notes (Signed)
    Subjective:  Jeffrey [name] is a 62 y.o. male who presents today with a chief complaint of type 2 diabetes.   HPI: Patient will be enrolling in a Medicare advantage plan later this year.  He is very concerned about financial implications of this and is looking into alternatives for his medications.   T2DM, established problem, improving Current regimen includes Lantus 20-24 units twice daily and NovoLog 5 units 3 times daily with meals.  Sugars usually in the 100s.  Patient is not sure of any oral medications that he has been on in the past. Currently on    HTN, established problem, stable Current regimen includes Coreg 6.25 mg twice daily, eplerenone 25 mg daily and losartan 100 mg daily.  Tolerates these well without side effects.  HLD, established problem, stable Currently on Lipitor 80 mg daily.  Tolerates this well without side effects.  ROS: Per HPI  PMH: He reports that he has never smoked. He has never used smokeless tobacco. He reports that he drinks about 0.6 oz of alcohol per week. He reports that he does not use drugs.  Objective:  Physical Exam: BP 130/78 (BP Location: Right Arm, Patient Position: Sitting, Cuff Size: Normal)   Pulse 74   Temp 98.7 F (37.1 C) (Oral)   Ht 5\' 8"  (1.727 m)   Wt 190 lb (86.2 kg)   SpO2 96%   BMI 28.89 kg/m   Gen: NAD, resting comfortably CV: RRR with no murmurs appreciated Pulm: NWOB, CTAB with no crackles, wheezes, or rhonchi  Assessment/Plan:  Insulin dependent diabetes mellitus with complications [facility]) Patient is very concerned about the cost of his diabetes medications once he switches insurance.  It is not clear to me why he has never been on metformin in the past.  We will start metformin extended release 1000 mg daily.  In the meantime, we will continue with Lantus 20 units twice daily.  Instructed patient to check his blood sugar 3 times daily before meals.  If it is more than 150, he should take 5 units of NovoLog.  If  it is less than 150 he should skip the NovoLog dose.  He will follow-up with me in a few weeks.  We will push his dose of metformin as tolerated.  I also gave a list of other diabetes medications and alternatives to the Lantus/NovoLog to look to see how much they would cost in the future.   Hypertension associated with diabetes ([place]) At goal.  Continue current regimen of Coreg 6.25 mg twice daily, eplerenone 25 mg daily, Lasix 40 mg daily, and losartan 100 mg daily.  Eplerenone will be very expensive for him.  Asked him to look into see how much spironolactone would cost him as an alternative.  Dyslipidemia Continue atorvastatin 80 mg daily.  Jeffrey [name]. Jeffrey Pain, MD 11/20/2017 5:09 PM

## 2017-11-20 NOTE — Patient Instructions (Signed)
Please start metformin 1000mg  once daily.  Continue taking lantus 20 units twice daily. Please check your blood sugar prior to taking novolog. If it is more than 150, take 5 Units, If it is less than 150, do nothing.  Alternatives to eplerenone: 1. Spironolactone  Alternatives to lantus: 1. Basalgar 2. Levemir or detemir 3. Tresiba  Alternatives to novolog: 1. Humalog 2. Humalin  Other possible  Diabetes medications we may start you on in the future: 1. Trulicity 2. Ozempic 3. Jardiance 4. Januvia 5. Invokana  Come back to see me in a few weeks or sooner as needed.  Take care, Dr [name]

## 2017-11-20 NOTE — Assessment & Plan Note (Signed)
At goal.  Continue current regimen of Coreg 6.25 mg twice daily, eplerenone 25 mg daily, Lasix 40 mg daily, and losartan 100 mg daily.  Eplerenone will be very expensive for him.  Asked him to look into see how much spironolactone would cost him as an alternative.

## 2017-12-03 ENCOUNTER — Encounter: Payer: Self-pay | Admitting: Family Medicine

## 2017-12-03 ENCOUNTER — Ambulatory Visit (INDEPENDENT_AMBULATORY_CARE_PROVIDER_SITE_OTHER): Payer: BLUE CROSS/BLUE SHIELD | Admitting: Family Medicine

## 2017-12-03 DIAGNOSIS — I152 Hypertension secondary to endocrine disorders: Secondary | ICD-10-CM

## 2017-12-03 DIAGNOSIS — E118 Type 2 diabetes mellitus with unspecified complications: Secondary | ICD-10-CM | POA: Diagnosis not present

## 2017-12-03 DIAGNOSIS — Z794 Long term (current) use of insulin: Secondary | ICD-10-CM | POA: Diagnosis not present

## 2017-12-03 DIAGNOSIS — I10 Essential (primary) hypertension: Secondary | ICD-10-CM

## 2017-12-03 DIAGNOSIS — E1159 Type 2 diabetes mellitus with other circulatory complications: Secondary | ICD-10-CM

## 2017-12-03 DIAGNOSIS — IMO0001 Reserved for inherently not codable concepts without codable children: Secondary | ICD-10-CM

## 2017-12-03 MED ORDER — SPIRONOLACTONE 25 MG PO TABS: 25.0000 mg | ORAL_TABLET | Freq: Every day | ORAL | 5 refills | Status: DC

## 2017-12-03 MED ORDER — METFORMIN HCL ER (MOD) 1000 MG PO TB24: 1000.0000 mg | ORAL_TABLET | Freq: Two times a day (BID) | ORAL | 11 refills | Status: DC

## 2017-12-03 NOTE — Assessment & Plan Note (Signed)
Patient doing well.  He has lost about 4 pounds over the last couple weeks.  Congratulated him on this encouraged continued dietary modifications.  We will increase his metformin to 1000 mg twice daily.  We will also start titrating down his Lantus.  We will drop to 18 units twice daily starting tonight.  Instructed patient to drop his daily total of Lantus by 1 unit each day that his fasting blood sugar was between 80 and 170.  We will continue NovoLog 5 units with meals for blood sugars greater than 150.  He will follow-up with me in 2 to 3 weeks.

## 2017-12-03 NOTE — Assessment & Plan Note (Signed)
At goal.  We will switch eplerenone to spironolactone 25 g daily.  Continue Coreg, Lasix, and losartan.  Follow-up in 2 to 3 weeks.

## 2017-12-03 NOTE — Patient Instructions (Signed)
Please increase your metformin to 1000mg  twice daily.  Please decrease your lantus to 18 units twice daily.  Please check your blood sugar in the morning. If it is in range, please decrease your dose of lantus by 1 unit. Please alternate decreasing your morning and evening doses.  Come back to see me in a few weeks, or sooner as needed.  Take care, Dr [name]

## 2017-12-03 NOTE — Progress Notes (Signed)
    Subjective:  Jeffrey [name] is a 62 y.o. male who presents today with a chief complaint of T2DM follow up.   HPI:  T2DM, established problem, stable Patient was seen a few weeks ago for this. Started on metformin 1000mg  daily.  He had diarrhea for the first couple of days, but since then he is tolerating it well.  He was continued on lantus 20 units twice daily and novolog 5 units three times daily before meals if his blood sugar is more than 150.  Since his last visit, he has only had to use NovoLog 2 or 3 times.  He has occasionally skipped his evening dose of Lantus due to blood sugar being on the lower side.  Denies any symptomatic hypoglycemic episodes.  Wt Readings from Last 3 Encounters:  12/03/17 186 lb 12.8 oz (84.7 kg)  11/20/17 190 lb (86.2 kg)  10/14/17 191 lb (86.6 kg)    HTN, established problem, stable Patient will need to switch from eplerenone due to cost.  States this prolactin will be affordable for him.  He has been compliant with his other blood pressure medications including Coreg 6.25 mg twice daily, Lasix 40 mg daily, and losartan 100 mg daily.  ROS: Per HPI  PMH: He reports that he has never smoked. He has never used smokeless tobacco. He reports that he drinks about 0.6 oz of alcohol per week. He reports that he does not use drugs.  Objective:  Physical Exam: BP 124/72 (BP Location: Left Arm, Patient Position: Sitting, Cuff Size: Normal)   Pulse 74   Temp (!) 97.5 F (36.4 C) (Oral)   Ht 5\' 8"  (1.727 m)   Wt 186 lb 12.8 oz (84.7 kg)   SpO2 99%   BMI 28.40 kg/m   Gen: NAD, resting comfortably CV: RRR with no murmurs appreciated Pulm: NWOB, CTAB with no crackles, wheezes, or rhonchi  Assessment/Plan:  Insulin dependent diabetes mellitus with complications [facility]) Patient doing well.  He has lost about 4 pounds over the last couple weeks.  Congratulated him on this encouraged continued dietary modifications.  We will increase his metformin to 1000  mg twice daily.  We will also start titrating down his Lantus.  We will drop to 18 units twice daily starting tonight.  Instructed patient to drop his daily total of Lantus by 1 unit each day that his fasting blood sugar was between 80 and 170.  We will continue NovoLog 5 units with meals for blood sugars greater than 150.  He will follow-up with me in 2 to 3 weeks.  Hypertension associated with diabetes ([place]) At goal.  We will switch eplerenone to spironolactone 25 g daily.  Continue Coreg, Lasix, and losartan.  Follow-up in 2 to 3 weeks.  Time Spent: I spent 25 minutes face-to-face with the patient, with more than half spent on counseling for management for his T2DM and HTN.   [name]. [name], MD 12/03/2017 11:35 AM

## 2017-12-04 ENCOUNTER — Other Ambulatory Visit: Payer: Self-pay

## 2017-12-04 ENCOUNTER — Telehealth: Payer: Self-pay | Admitting: Family Medicine

## 2017-12-04 MED ORDER — METFORMIN HCL ER 500 MG PO TB24: 1000.0000 mg | ORAL_TABLET | Freq: Two times a day (BID) | ORAL | 11 refills | Status: DC

## 2017-12-04 NOTE — Telephone Encounter (Signed)
Corrected rx sent to pharmacy.

## 2017-12-04 NOTE — Telephone Encounter (Signed)
Copied from [place]. Topic: Quick Communication - See Telephone Encounter >> Dec 04, 2017 10:24 AM Synthia Innocent wrote: CRM for notification. See Telephone encounter for: 12/04/17. Patient calling and stating that insurance will not cover metFORMIN (GLUMETZA) 1000 MG (MOD) 24 hr tablet, only will cover 500mg . Please advise

## 2017-12-09 LAB — HM DIABETES EYE EXAM

## 2017-12-14 ENCOUNTER — Ambulatory Visit (INDEPENDENT_AMBULATORY_CARE_PROVIDER_SITE_OTHER): Payer: BLUE CROSS/BLUE SHIELD | Admitting: Cardiovascular Disease

## 2017-12-14 ENCOUNTER — Encounter: Payer: Self-pay | Admitting: Cardiovascular Disease

## 2017-12-14 VITALS — BP 87/51 | HR 74 | Ht 68.0 in | Wt 182.4 lb

## 2017-12-14 DIAGNOSIS — R55 Syncope and collapse: Secondary | ICD-10-CM | POA: Diagnosis not present

## 2017-12-14 DIAGNOSIS — I119 Hypertensive heart disease without heart failure: Secondary | ICD-10-CM | POA: Diagnosis not present

## 2017-12-14 DIAGNOSIS — I2510 Atherosclerotic heart disease of native coronary artery without angina pectoris: Secondary | ICD-10-CM | POA: Diagnosis not present

## 2017-12-14 DIAGNOSIS — Z9861 Coronary angioplasty status: Secondary | ICD-10-CM

## 2017-12-14 DIAGNOSIS — E7800 Pure hypercholesterolemia, unspecified: Secondary | ICD-10-CM

## 2017-12-14 MED ORDER — LOSARTAN POTASSIUM 25 MG PO TABS: 25.0000 mg | ORAL_TABLET | Freq: Two times a day (BID) | ORAL | 5 refills | Status: DC

## 2017-12-14 NOTE — Patient Instructions (Signed)
Medication Instructions:  DECREASE YOUR LOSARTAN TO 25 MG TWICE A DAY   Labwork: NONE  Testing/Procedures: NONE  Follow-Up: Your physician recommends that you schedule a follow-up appointment in: [place] FOR BLOOD PRESSURE  Your physician recommends that you schedule a follow-up appointment in: [place]   If you need a refill on your cardiac medications before your next appointment, please call your pharmacy.

## 2017-12-14 NOTE — Progress Notes (Signed)
Cardiology Office Note   Date:  12/15/2017   ID:  Rudie Sermons, DOB [birth year], MRN [id number]  PCP:  Vivi Barrack, MD  Cardiologist:   Skeet Latch, MD   Chief Complaint  Patient presents with  . Follow-up    2 months;      History of Present Illness: Jeffrey [name] is a 62 y.o. male with hypertension, hyperlipidemia, diabetes and peripheral neuropathy who presents for follow up.  [name] recently moved to [facility] from [place].  He moved here with his identical twin brother.  Jeffrey [name] had a cardiac arrest 11/23/13 and underwent PCI of the LAD.  Echo 10/2015 revealed an anteroseptal and apical septal infarct with associated hypokinesis.  LVEF was 55%.  He had a PET CT/myocardial perfusion scan 10/2015 that revealed a moderate sized, moderate intensity antero-apical infarct with mild ischemia.  At the end of 2016 he had an episode of stabbing chest pain.  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  At that time his clopidogrel was discontinued and he underwent several laser procedures.  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    At his last appointment Jeffrey [name] reported atypical chest pain.  He was referred for [facility] 09/2017 that revealed LVEF 48% and a prior infarct in the apical inferior, apical lateral, and apical regions.  There was no ischemia.  He has struggled with syncope and his antihypertensives were reduced at prior appointments.  Since that time he has been feeling fairly well.  He does continue to have some episodes of dizziness but no recurrent syncope.  When he gets lightheaded he checks his blood pressure and it typically is in the 80s.  He has to pace himself.  He does not get much formal exercise but does housework.  He is only able to clean approximate 1  room at a time.    Past Medical History:  Diagnosis Date  . Cardiac  arrest ([place]) 05/22/2016  . CHF (congestive heart failure) ([place])   . Diabetes mellitus without complication ([place])   . Hyperlipidemia   . Hypertension   . Myocardial infarct ([place]) 2105  . S/P primary angioplasty with coronary stent 05/22/2016    History reviewed. No pertinent surgical history.   Current Outpatient Medications  Medication Sig Dispense Refill  . aspirin EC 81 MG tablet Take 81 mg by mouth daily.    [name] Kitchen atorvastatin (LIPITOR) 80 MG tablet Take 1 tablet (80 mg total) by mouth daily. 30 tablet 11  . carvedilol (COREG) 6.25 MG tablet Take 1 tablet (6.25 mg total) by mouth 2 (two) times daily with a meal. 60 tablet 5  . cholecalciferol (VITAMIN D) 1000 units tablet Take 2,000 Units by mouth daily.    . furosemide (LASIX) 40 MG tablet Take 1 tablet (40 mg total) by mouth daily. 30 tablet 11  . glucose blood (CONTOUR NEXT TEST) test strip Use 4 times a day as needed to check blood sugar. 100 each 11  . insulin aspart (NOVOLOG) 100 UNIT/ML injection Inject 5 Units into the skin 3 (three) times daily with meals. Takes 5-7 units with meals 30 mL 11  . insulin glargine (LANTUS) 100 UNIT/ML injection Inject 20-24 units twice daily. 30 mL 11  . Insulin Syringe-Needle U-100 (B-D INS SYR HALF-UNIT .3CC/31G) 31G X 5/16" 0.3 ML MISC Use 6  times a day 200 each 11  . metFORMIN (GLUCOPHAGE XR) 500 MG 24 hr tablet Take 2 tablets (1,000 mg total) by mouth 2 (two) times daily. 120 tablet 11  . metoCLOPramide (REGLAN) 10 MG tablet Take 1 tablet (10 mg total) by mouth 4 (four) times daily. 360 tablet 3  . omeprazole (PRILOSEC) 40 MG capsule Take 1 capsule (40 mg total) by mouth 2 (two) times daily. 180 capsule 3  . spironolactone (ALDACTONE) 25 MG tablet Take 1 tablet (25 mg total) by mouth daily. 30 tablet 5  . losartan (COZAAR) 25 MG tablet Take 1 tablet (25 mg total) by mouth 2 (two) times daily. 60 tablet 5   No current facility-administered medications for this visit.     Allergies:   Codeine  and Penicillins    Social History:  The patient  reports that he has never smoked. He has never used smokeless tobacco. He reports that he drinks about 0.6 oz of alcohol per week. He reports that he does not use drugs.   Family History:  The patient's family history includes Alzheimer's disease in his mother; Heart disease in his brother and father; Peripheral Artery Disease in his father.    ROS:  Please see the history of present illness.   Otherwise, review of systems are positive for cold hands.   All other systems are reviewed and negative.    PHYSICAL EXAM: VS:  BP (!) 87/51   Pulse 74   Ht 5\' 8"  (1.727 m)   Wt 182 lb 6.4 oz (82.7 kg)   BMI 27.73 kg/m  , BMI Body mass index is 27.73 kg/m. GENERAL:  Well appearing.  No acute distress HEENT: Pupils equal round and reactive, fundi not visualized, oral mucosa unremarkable NECK:  No jugular venous distention, waveform within normal limits, carotid upstroke brisk and symmetric, no bruits, no thyromegaly LYMPHATICS:  No cervical adenopathy LUNGS:  Clear to auscultation bilaterally HEART:  RRR.  PMI not displaced or sustained,S1 and S2 within normal limits, no S3, no S4, no clicks, no rubs, no murmurs ABD:  Flat, positive bowel sounds normal in frequency in pitch, no bruits, no rebound, no guarding, no midline pulsatile mass, no hepatomegaly, no splenomegaly EXT:  2 plus pulses throughout, no edema, no cyanosis no clubbing SKIN:  No rashes no nodules NEURO:  Cranial nerves II through XII grossly intact, motor grossly intact throughout PSYCH:  Cognitively intact, oriented to person place and time.  Depressed affect   EKG:  EKG is ordered today. The ekg ordered 05/22/16 demonstrates sinus rhythm rate 63 bpm.  Prior anteroseptal infarct.  LAFB 11/18/16: Sinus rhythm.  Rate 64 bpm.  Low voltage limb and precordial leads 09/16/17: Sinus bradycardia rate 59 bpm.    Echo 11/01/15: LVEF >55%.  Mild concentric LVH. Mild anteroseptal and apical  septal hypokinesis. Normal RV function.  Lexiscan Myoview 09/2017:  The left ventricular ejection fraction is mildly decreased (45-54%).  Nuclear stress EF: 48%.  There was no ST segment deviation noted during stress.  Defect 1: There is a small defect of severe severity present in the apical inferior, apical lateral and apex location.  This is a low risk study.   Abnormal, low risk stress nuclear study with prior apical infarct; no ischemia; EF 48 with mild global hypokinesis; mild LVE.   Recent Labs: 04/14/2017: ALT 19; BUN 19; Creatinine, Ser 1.01; Hemoglobin 14.2; Platelets 332.0; Potassium 4.5; Sodium 134    Lipid Panel    Component Value Date/Time  CHOL 133 04/14/2017 1523   TRIG 182.0 (H) 04/14/2017 1523   HDL 35.80 (L) 04/14/2017 1523   CHOLHDL 4 04/14/2017 1523   VLDL 36.4 04/14/2017 1523   LDLCALC 61 04/14/2017 1523    09/07/15: Sodium 138, potassium 4.5, BUN 20, creatinine 1.02 AST 17, ALT 24 Total cholesterol 113, triglycerides 146, HDL 32, LDL 52 TSH 1.76 WBC 11.7, hemoglobin 13.4, hematocrit 40.2, platelets 317 Hemoglobin A1c 8.9 on a relatively dry%  Wt Readings from Last 3 Encounters:  12/14/17 182 lb 6.4 oz (82.7 kg)  12/03/17 186 lb 12.8 oz (84.7 kg)  11/20/17 190 lb (86.2 kg)     ASSESSMENT AND PLAN:  # Syncope: Jeffrey [name] has not experienced any recurrent syncope since lowering his antihypertensive regimen.  However he continues to be lightheaded and hypotensive.  We will reduce losartan to 25 mg twice daily.  If his blood pressure remains low and he is still dizzy this can be reduced to once daily.  # CAD s/p cardiac arrest and LAD PCI: Chest pain resolved.  Lexiscan Myoview was reassuring.  Continue aspirin, atorvastatin, and carvedilol.  # Hypertension:  Blood pressure is low today.  Reduce losartan as above.  Continue carvedilol and spironolactone.  # Hyperlipidemia.  LDL 61 on 04/2017.  Continue atorvastatin.    Current medicines are  reviewed at length with the patient today.  The patient does not have concerns regarding medicines.  The following changes have been made:  Reduce losartan to 50 mg.  Labs/ tests ordered today include:   No orders of the defined types were placed in this encounter.    Disposition:   FU with Jeffrey Carswell C. Oval Linsey, MD, [facility] in 4 months.  APP in 1 month for BP check.    Signed, Jeffrey Daws C. Oval Linsey, MD, [facility]  12/15/2017 6:09 AM    [place]

## 2017-12-15 ENCOUNTER — Encounter: Payer: Self-pay | Admitting: Cardiovascular Disease

## 2017-12-15 ENCOUNTER — Telehealth: Payer: Self-pay | Admitting: Cardiovascular Disease

## 2017-12-15 MED ORDER — LOSARTAN POTASSIUM 50 MG PO TABS: 25.0000 mg | ORAL_TABLET | Freq: Two times a day (BID) | ORAL | 5 refills | Status: DC

## 2017-12-15 NOTE — Telephone Encounter (Signed)
New Message    *STAT* If patient is at the pharmacy, call can be transferred to refill team.   1. Which medications need to be refilled? (please list name of each medication and dose if known) losartan (COZAAR) 25 MG tablet  2. Which pharmacy/location (including street and city if local pharmacy) is medication to be sent to? [facility]  3. Do they need a 30 day or 90 day supply? 90  Patient is stating that Dr. Oval Linsey wanted to change the dosage to 50mg  so they are needing a new rx. He indicates that the insurance will only cover the 50mg  tablet and not the 25mg .

## 2017-12-15 NOTE — Telephone Encounter (Signed)
[facility]ED [facility] [employer] [facility] WILL NOT PA FOR 25MG /BID SO RX 50MG /(1/2TAB)BID, THIS WAY IT IS ONLY 1 WHOLE TAB DAILY/ NEW RX SENT

## 2017-12-16 ENCOUNTER — Encounter: Payer: Self-pay | Admitting: Family Medicine

## 2017-12-16 ENCOUNTER — Other Ambulatory Visit: Payer: Self-pay | Admitting: Family Medicine

## 2017-12-16 ENCOUNTER — Ambulatory Visit (INDEPENDENT_AMBULATORY_CARE_PROVIDER_SITE_OTHER): Payer: BLUE CROSS/BLUE SHIELD | Admitting: Family Medicine

## 2017-12-16 VITALS — BP 114/64 | HR 70 | Temp 97.6°F | Ht 68.0 in | Wt 181.6 lb

## 2017-12-16 DIAGNOSIS — J3489 Other specified disorders of nose and nasal sinuses: Secondary | ICD-10-CM | POA: Diagnosis not present

## 2017-12-16 DIAGNOSIS — E118 Type 2 diabetes mellitus with unspecified complications: Secondary | ICD-10-CM | POA: Diagnosis not present

## 2017-12-16 DIAGNOSIS — I152 Hypertension secondary to endocrine disorders: Secondary | ICD-10-CM

## 2017-12-16 DIAGNOSIS — IMO0001 Reserved for inherently not codable concepts without codable children: Secondary | ICD-10-CM

## 2017-12-16 DIAGNOSIS — Z794 Long term (current) use of insulin: Secondary | ICD-10-CM | POA: Diagnosis not present

## 2017-12-16 DIAGNOSIS — I10 Essential (primary) hypertension: Secondary | ICD-10-CM | POA: Diagnosis not present

## 2017-12-16 DIAGNOSIS — E1159 Type 2 diabetes mellitus with other circulatory complications: Secondary | ICD-10-CM

## 2017-12-16 MED ORDER — IPRATROPIUM BROMIDE 0.06 % NA SOLN: 2.0000 | Freq: Four times a day (QID) | NASAL | 12 refills | Status: DC

## 2017-12-16 NOTE — Patient Instructions (Signed)
It was nice to see you today!  Please take 2 pills of metformin in the morning and 1 pill in the afternoon.  Please start the atrovent.  No other changes today.  Please come back to see me in 1 month, or sooner as needed.  Take care, Dr [name]

## 2017-12-16 NOTE — Assessment & Plan Note (Signed)
At goal on decreased dose of losartan.  Continue current regimen of Coreg 6.25 mg twice daily, losartan 50 mg daily, spironolactone 25 mg daily, and Lasix 40 mg daily.  Discussed home blood pressure monitoring and goal blood pressures.  Follow-up with me next office visit.

## 2017-12-16 NOTE — Assessment & Plan Note (Signed)
Sugars continue to be well controlled.  We will cut back his metformin to 1000 mg in the morning and 500 mg in the evening.  Continue weaning dose of Lantus for fasting blood sugars between 70 and 170.  He will follow-up with me in about 6 weeks.

## 2017-12-16 NOTE — Assessment & Plan Note (Signed)
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  Start Atrovent nasal spray.  Would avoid oral decongestants given cardiac history.

## 2017-12-16 NOTE — Progress Notes (Signed)
    Subjective:  Jeffrey [name] is a 62 y.o. male who presents today with a chief complaint of T2DM.   HPI:  T2DM, chronic problem Patient seen 2 weeks ago for this.  At that time his metformin was increased to 1000 mg twice daily.  He was also instructed to decrease his dose of Lantus every day that his fasting blood sugar was at goal.  He has been able to decrease his Lantus to 15 units twice daily.  He has done well at this dose.  Sugars are usually in the 100s range.  He had one in the 200s but otherwise sugars have been fairly well controlled.  He noticed a little bit of nausea at night with increased dose of metformin but otherwise denies any other side effects.  Rhinorrhea, chronic problem Several year history.  New problem to this provider.  Symptoms worse when eating.  No treatments tried.  No obvious alleviating factors.  Hypertension, chronic problem Saw cardiology earlier this week. Decreased losartan to 50mg  daily. Also on coreg 6.25mg  bid spironolactone 25 mg daily, and Lasix 40 mg daily.  He has tolerated all these well without side effects.  He has been checking his blood pressures at home and they are usually in the 100s over 70s.  ROS: Per HPI  PMH: He reports that he has never smoked. He has never used smokeless tobacco. He reports that he drinks about 0.6 oz of alcohol per week. He reports that he does not use drugs.  Objective:  Physical Exam: BP 114/64 (BP Location: Left Arm, Patient Position: Sitting, Cuff Size: Normal)   Pulse 70   Temp 97.6 F (36.4 C) (Oral)   Ht 5\' 8"  (1.727 m)   Wt 181 lb 9.6 oz (82.4 kg)   SpO2 97%   BMI 27.61 kg/m   Gen: NAD, resting comfortably HEENT: TMs clear bilaterally.  Nasal mucosa slightly erythematous with clear nasal discharge.  Oropharynx clear. CV: RRR with no murmurs appreciated Pulm: NWOB, CTAB with no crackles, wheezes, or rhonchi  Assessment/Plan:  Insulin dependent diabetes mellitus with complications ([place]) Sugars  continue to be well controlled.  We will cut back his metformin to 1000 mg in the morning and 500 mg in the evening.  Continue weaning dose of Lantus for fasting blood sugars between 70 and 170.  He will follow-up with me in about 6 weeks.  Rhinorrhea Likely secondary to gustatory rhinitis versus allergic rhinitis.  Start Atrovent nasal spray.  Would avoid oral decongestants given cardiac history.  Hypertension associated with diabetes ([place]) At goal on decreased dose of losartan.  Continue current regimen of Coreg 6.25 mg twice daily, losartan 50 mg daily, spironolactone 25 mg daily, and Lasix 40 mg daily.  Discussed home blood pressure monitoring and goal blood pressures.  Follow-up with me next office visit.   Jeffrey [name]. Jeffrey Pain, MD 12/16/2017 11:23 AM

## 2017-12-24 ENCOUNTER — Encounter: Payer: Self-pay | Admitting: Physical Therapy

## 2018-01-12 ENCOUNTER — Ambulatory Visit: Payer: BLUE CROSS/BLUE SHIELD | Admitting: Family Medicine

## 2018-01-29 ENCOUNTER — Other Ambulatory Visit: Payer: Self-pay | Admitting: Cardiovascular Disease

## 2018-02-18 ENCOUNTER — Other Ambulatory Visit: Payer: Self-pay | Admitting: Family Medicine

## 2018-03-01 ENCOUNTER — Encounter: Payer: Self-pay | Admitting: Family Medicine

## 2018-03-01 ENCOUNTER — Ambulatory Visit (INDEPENDENT_AMBULATORY_CARE_PROVIDER_SITE_OTHER): Payer: PPO | Admitting: Family Medicine

## 2018-03-01 VITALS — BP 118/68 | HR 67 | Temp 98.0°F | Ht 68.0 in | Wt 178.2 lb

## 2018-03-01 DIAGNOSIS — I152 Hypertension secondary to endocrine disorders: Secondary | ICD-10-CM

## 2018-03-01 DIAGNOSIS — E1169 Type 2 diabetes mellitus with other specified complication: Secondary | ICD-10-CM

## 2018-03-01 DIAGNOSIS — E1159 Type 2 diabetes mellitus with other circulatory complications: Secondary | ICD-10-CM

## 2018-03-01 DIAGNOSIS — Z794 Long term (current) use of insulin: Secondary | ICD-10-CM

## 2018-03-01 DIAGNOSIS — I8393 Asymptomatic varicose veins of bilateral lower extremities: Secondary | ICD-10-CM | POA: Diagnosis not present

## 2018-03-01 DIAGNOSIS — I10 Essential (primary) hypertension: Secondary | ICD-10-CM | POA: Diagnosis not present

## 2018-03-01 DIAGNOSIS — E785 Hyperlipidemia, unspecified: Secondary | ICD-10-CM

## 2018-03-01 DIAGNOSIS — E118 Type 2 diabetes mellitus with unspecified complications: Secondary | ICD-10-CM

## 2018-03-01 DIAGNOSIS — IMO0001 Reserved for inherently not codable concepts without codable children: Secondary | ICD-10-CM

## 2018-03-01 LAB — POCT GLYCOSYLATED HEMOGLOBIN (HGB A1C): Hemoglobin A1C: 7 % — AB (ref 4.0–5.6)

## 2018-03-01 MED ORDER — INSULIN GLARGINE 100 UNIT/ML ~~LOC~~ SOLN: SUBCUTANEOUS | 11 refills | Status: DC

## 2018-03-01 NOTE — Patient Instructions (Addendum)
It was very nice to see you today!  Please keep up the good work! We will not make any medication changes today.  You have varicose veins. This does not cause any long term problems. Please let me know if you would like a referral to have them treated.   Please send me your prior colonoscopy records.   Come back to see me in 3-6 months, or sooner as needed.  Take care, Dr [name]

## 2018-03-01 NOTE — Assessment & Plan Note (Signed)
At goal.  Continue current doses of Coreg, losartan, and spironolactone.

## 2018-03-01 NOTE — Assessment & Plan Note (Signed)
Continue atorvastatin 80 mg daily.  Check lipid panel with next blood draw. 

## 2018-03-01 NOTE — Assessment & Plan Note (Signed)
Reassured patient.  Given that symptoms are very mild currently, we will not pursue further work-up at this point.  Offered referral to dermatology for cosmetic treatment, however patient declined.  Encouraged the legs elevated as much as possible.  Discussed reasons to return to care.

## 2018-03-01 NOTE — Progress Notes (Signed)
   Subjective:  Jeffrey [name] is a 62 y.o. male who presents today with a chief complaint of T2DM follow up.   HPI:  T2DM, chronic problem, stable Patient last seen about 2.5 months ago for this.  He was on metformin 1000 mg in the morning and 500 mg in the evening.  He was also weaning down his dose of Lantus with goal blood sugar 70-170.  He is now currently on Lantus 1518 units twice daily.  Rarely uses NovoLog.  Thinks he is doing very well with his current medication regimen.  Varicose Veins, new problem Started several weeks ago.  States that one of his friends use a massager to his legs and subsequently noticed prominent veins and spider veins to the area.  They are itchy but not painful.  No specific treatments tried.  HLD, chronic problem Currently on lipitor 80mg  daily. No reported chest pain or shortness of breath.   HTN, chronic problem Currently on coreg 6.25mg  bid, losartan 50mg  daily, and spironolactone 25mg  daily.   ROS: Per HPI  PMH: He reports that he has never smoked. He has never used smokeless tobacco. He reports that he drinks about 0.6 oz of alcohol per week. He reports that he does not use drugs.  Objective:  Physical Exam: BP 118/68 (BP Location: Left Arm, Patient Position: Sitting, Cuff Size: Normal)   Pulse 67   Temp 98 F (36.7 C) (Oral)   Ht 5\' 8"  (1.727 m)   Wt 178 lb 3.2 oz (80.8 kg)   SpO2 99%   BMI 27.10 kg/m   Gen: NAD, resting comfortably CV: RRR with no murmurs appreciated Pulm: NWOB, CTAB with no crackles, wheezes, or rhonchi MSK: Varicosities noted to bilateral lower extremities.  Results for orders placed or performed in visit on 03/01/18 (from the past 24 hour(s))  POCT glycosylated hemoglobin (Hb A1C)     Status: Abnormal   Collection Time: 03/01/18  1:40 PM  Result Value Ref Range   Hemoglobin A1C 7.0 (A) 4.0 - 5.6 %   HbA1c POC (<> result, manual entry)  4.0 - 5.6 %   HbA1c, POC (prediabetic range)  5.7 - 6.4 %   HbA1c, POC  (controlled diabetic range)  0.0 - 7.0 %     Assessment/Plan:  Insulin dependent diabetes mellitus with complications ([name]) O5D improved to 7.0.  Continue metformin 1500 mg daily and Lantus 15 to 18 units twice daily.  He will follow-up with me in 3 to 6 months.  Hypertension associated with diabetes ([place]) At goal.  Continue current doses of Coreg, losartan, and spironolactone.  Hyperlipidemia associated with type 2 diabetes mellitus ([name]) Continue atorvastatin 80 mg daily.  Check lipid panel with next blood draw.  Varicose veins of both lower extremities Reassured patient.  Given that symptoms are very mild currently, we will not pursue further work-up at this point.  Offered referral to dermatology for cosmetic treatment, however patient declined.  Encouraged the legs elevated as much as possible.  Discussed reasons to return to care.  Preventative healthcare Patient will check on status of colonoscopy.  [name]. [name], MD 03/01/2018 2:37 PM

## 2018-03-01 NOTE — Assessment & Plan Note (Signed)
A1c improved to 7.0.  Continue metformin 1500 mg daily and Lantus 15 to 18 units twice daily.  He will follow-up with me in 3 to 6 months.

## 2018-03-09 ENCOUNTER — Other Ambulatory Visit: Payer: Self-pay

## 2018-03-09 ENCOUNTER — Telehealth: Payer: Self-pay | Admitting: Family Medicine

## 2018-03-09 MED ORDER — ONETOUCH ULTRASOFT LANCETS MISC: 11 refills | Status: DC

## 2018-03-09 MED ORDER — GLUCOSE BLOOD VI STRP: ORAL_STRIP | 11 refills | Status: DC

## 2018-03-09 NOTE — Telephone Encounter (Signed)
MEDICATION:  One touch verio, Lancets and test strips (x4 a day)  PHARMACY:   [id number] - [place], [place] - [address] [phone] (Phone) [phone] (Fax)      IS THIS A 90 DAY SUPPLY : N  IS PATIENT OUT OF MEDICATION: Y  IF NOT; HOW MUCH IS LEFT:   LAST APPOINTMENT DATE: @7 /22/2019  NEXT APPOINTMENT DATE:@10 /22/2019  OTHER COMMENTS: Patient stated medicare will not cover what he was previously getting (contour next). Patient also stated on the Rx needs to be "specific direction,  NPI #, ICD-10 code"    **Let patient know to contact pharmacy at the end of the day to make sure medication is ready. **  ** Please notify patient to allow 48-72 hours to process**  **Encourage patient to contact the pharmacy for refills or they can request refills through [facility]**

## 2018-03-09 NOTE — Telephone Encounter (Signed)
Rx has been sent to patient's pharmacy. 

## 2018-03-11 ENCOUNTER — Other Ambulatory Visit: Payer: Self-pay

## 2018-03-11 MED ORDER — ONETOUCH VERIO IQ SYSTEM W/DEVICE KIT: 1.0000 | PACK | Freq: Four times a day (QID) | 0 refills | Status: AC

## 2018-03-11 NOTE — Telephone Encounter (Signed)
Spoke with patient and notified him that the meter has been sent to the pharmacy. Advised that it was a misunderstanding that the meter was not originally sent.  Patient verbalized understanding.  Patient apologized for getting so upset when he called earlier.

## 2018-03-11 NOTE — Telephone Encounter (Signed)
Noted.  Also spoke with pharmacy and they are giving the Verio meter as prescribed.

## 2018-03-11 NOTE — Telephone Encounter (Signed)
Patient is calling and is very upset and yelling that his One Touch Verio meter was not called in and he is unsure how someone expects him to check his sugar without the most important part, the meter. Patient states he does not understand when he was very clear when he came to the office with the piece of paper. Patient said that he will come up the office today and handle this. He states if this is not done by today he is switching providers and he is fed up with this practice.   I apologized to the patient and informed him I would send another message back and inform the pcp of what he is needing and try to get this handled today, patient continued to yell and scream and repeat he is fed up. Patient ended up hanging up.

## 2018-03-11 NOTE — Telephone Encounter (Signed)
FYI:  Summer at [facility] calling and states that the patient is needing the One Touch Ultra meter, that the wrong one was sent to the pharmacy. Advised her that the patient called and stated that he needed a One Touch Verio meter.

## 2018-03-11 NOTE — Telephone Encounter (Signed)
See note

## 2018-04-14 ENCOUNTER — Telehealth: Payer: Self-pay

## 2018-04-14 NOTE — Telephone Encounter (Signed)
Patient's brother stopped by the office on 04/13/2018 to ask about getting a flu shot and any other vaccinations he (and his brother) may need.  He may be scheduled for a nurse visit at his convenience for a flu shot, and is also due for a Tdap if he would like to get one.  I was unable to contact the patient on 04/13/2018 due to work flow, but will attempt to reach the patient today.  Ok to schedule for nurse visit if I do not speak to the patient first.

## 2018-04-21 ENCOUNTER — Other Ambulatory Visit: Payer: Self-pay | Admitting: Family Medicine

## 2018-04-22 ENCOUNTER — Ambulatory Visit (INDEPENDENT_AMBULATORY_CARE_PROVIDER_SITE_OTHER): Payer: PPO | Admitting: Cardiovascular Disease

## 2018-04-22 ENCOUNTER — Encounter: Payer: Self-pay | Admitting: Cardiovascular Disease

## 2018-04-22 VITALS — BP 96/60 | HR 69 | Ht 68.0 in | Wt 177.6 lb

## 2018-04-22 DIAGNOSIS — E7800 Pure hypercholesterolemia, unspecified: Secondary | ICD-10-CM

## 2018-04-22 DIAGNOSIS — I2510 Atherosclerotic heart disease of native coronary artery without angina pectoris: Secondary | ICD-10-CM

## 2018-04-22 DIAGNOSIS — W19XXXA Unspecified fall, initial encounter: Secondary | ICD-10-CM | POA: Diagnosis not present

## 2018-04-22 DIAGNOSIS — I5042 Chronic combined systolic (congestive) and diastolic (congestive) heart failure: Secondary | ICD-10-CM | POA: Diagnosis not present

## 2018-04-22 DIAGNOSIS — I119 Hypertensive heart disease without heart failure: Secondary | ICD-10-CM

## 2018-04-22 DIAGNOSIS — R55 Syncope and collapse: Secondary | ICD-10-CM

## 2018-04-22 DIAGNOSIS — Z9861 Coronary angioplasty status: Secondary | ICD-10-CM | POA: Diagnosis not present

## 2018-04-22 DIAGNOSIS — Z5181 Encounter for therapeutic drug level monitoring: Secondary | ICD-10-CM

## 2018-04-22 MED ORDER — LOSARTAN POTASSIUM 25 MG PO TABS: 25.0000 mg | ORAL_TABLET | Freq: Every day | ORAL | 1 refills | Status: DC

## 2018-04-22 MED ORDER — CARVEDILOL 3.125 MG PO TABS: 3.1250 mg | ORAL_TABLET | Freq: Two times a day (BID) | ORAL | 1 refills | Status: DC

## 2018-04-22 NOTE — Progress Notes (Signed)
Cardiology Office Note   Date:  04/22/2018   ID:  Jeffrey [name], DOB 30-Oct-1955, MRN [id number]  PCP:  Vivi Barrack, MD  Cardiologist:   Skeet Latch, MD   No chief complaint on file.    History of Present Illness: Jeffrey [name] is a 62 y.o. male with hypertension, hyperlipidemia, diabetes and peripheral neuropathy who presents for follow up.  [name] recently moved to [facility] from [place].  He moved here with his identical twin brother.  [name] had a cardiac arrest 11/23/13 and underwent PCI of the LAD.  Echo 10/2015 revealed an anteroseptal and apical septal infarct with associated hypokinesis.  LVEF was 55%.  He had a PET CT/myocardial perfusion scan 10/2015 that revealed a moderate sized, moderate intensity antero-apical infarct with mild ischemia.  At the end of 2016 he had an episode of stabbing chest pain.  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  At that time his clopidogrel was discontinued and he underwent several laser procedures.  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    [name] reported atypical chest pain.  He was referred for [facility] 09/2017 that revealed LVEF 48% and a prior infarct in the apical inferior, apical lateral, and apical regions.  There was no ischemia.  He has struggled with syncope and his antihypertensives were reduced at prior appointments.  At his last appointment he had not experienced any recurrent syncope but was continued to be lightheaded so losartan was reduced.  He reports that his breathing has been okay and he has not had any chest pain.  However he has been falling a lot.  He gets very lightheaded, especially when in the shower.  He also has positional dizziness.  He suffers from neuropathy and has had swelling in his ankles.  He has no orthopnea or PND.  He has a hard time bathing himself because of  getting so dizzy.  He has access to [employer] but isn't using it.  He feels like he will fall forward when he tries to walk.   Past Medical History:  Diagnosis Date  . Cardiac arrest ([place]) 05/22/2016  . CHF (congestive heart failure) ([place])   . Diabetes mellitus without complication ([place])   . Hyperlipidemia   . Hypertension   . Myocardial infarct ([place]) 2105  . S/P primary angioplasty with coronary stent 05/22/2016    History reviewed. No pertinent surgical history.   Current Outpatient Medications  Medication Sig Dispense Refill  . aspirin EC 81 MG tablet Take 81 mg by mouth daily.    [name] Kitchen atorvastatin (LIPITOR) 80 MG tablet Take 1 tablet (80 mg total) by mouth daily. 30 tablet 11  . Blood Glucose Monitoring Suppl (ONETOUCH VERIO IQ SYSTEM) w/Device KIT 1 kit by Does not apply route 4 (four) times daily. 1 kit 0  . carvedilol (COREG) 3.125 MG tablet Take 1 tablet (3.125 mg total) by mouth 2 (two) times daily with a meal. 180 tablet 1  . cholecalciferol (VITAMIN D) 1000 units tablet Take 2,000 Units by mouth daily.    . furosemide (LASIX) 40 MG tablet Take 1 tablet (40 mg total) by mouth daily. 30 tablet 11  . glucose blood test strip Check blood sugar 4 times per day 200 each 11  . insulin glargine (LANTUS) 100 UNIT/ML injection Inject 15-28 units twice  daily. 30 mL 11  . Insulin Syringe-Needle U-100 (B-D INS SYR HALF-UNIT .3CC/31G) 31G X 5/16" 0.3 ML MISC Use 6 times a day 200 each 11  . ipratropium (ATROVENT) 0.06 % nasal spray Place 2 sprays into both nostrils 4 (four) times daily. 15 mL 12  . Lancets (ONETOUCH ULTRASOFT) lancets Check blood sugar 4 times per day 200 each 11  . losartan (COZAAR) 25 MG tablet Take 1 tablet (25 mg total) by mouth daily. 90 tablet 1  . metFORMIN (GLUCOPHAGE XR) 500 MG 24 hr tablet Take 2 tablets (1,000 mg total) by mouth 2 (two) times daily. 120 tablet 11  . metoCLOPramide (REGLAN) 10 MG tablet Take 1 tablet (10 mg total) by mouth 4 (four) times  daily. 360 tablet 3  . omeprazole (PRILOSEC) 40 MG capsule Take 1 capsule (40 mg total) by mouth 2 (two) times daily. 180 capsule 3  . spironolactone (ALDACTONE) 25 MG tablet TAKE 1 TABLET BY MOUTH EVERY DAY 90 tablet 1   No current facility-administered medications for this visit.     Allergies:   Codeine and Penicillins    Social History:  The patient  reports that he has never smoked. He has never used smokeless tobacco. He reports that he drinks about 1.0 standard drinks of alcohol per week. He reports that he does not use drugs.   Family History:  The patient's family history includes Alzheimer's disease in his mother; Heart disease in his brother and father; Peripheral Artery Disease in his father.    ROS:  Please see the history of present illness.   Otherwise, review of systems are positive for cold hands.   All other systems are reviewed and negative.    PHYSICAL EXAM: VS:  BP 96/60   Pulse 69   Ht _0  (1.727 m)   Wt 177 lb 9.6 oz (80.6 kg)   SpO2 97%   BMI 27.00 kg/m  , BMI Body mass index is 27 kg/m. GENERAL:  Well appearing HEENT: Pupils equal round and reactive, fundi not visualized, oral mucosa unremarkable NECK:  No jugular venous distention, waveform within normal limits, carotid upstroke brisk and symmetric, no bruits LUNGS:  Clear to auscultation bilaterally HEART:  RRR.  PMI not displaced or sustained,S1 and S2 within normal limits, no S3, no S4, no clicks, no rubs, no murmurs ABD:  Flat, positive bowel sounds normal in frequency in pitch, no bruits, no rebound, no guarding, no midline pulsatile mass, no hepatomegaly, no splenomegaly EXT:  2 plus pulses throughout, no edema, no cyanosis no clubbing SKIN:  No rashes no nodules NEURO:  Cranial nerves II through XII grossly intact, motor grossly intact throughout PSYCH:  Cognitively intact, oriented to person place and time   EKG:  EKG is ordered today. The ekg ordered 05/22/16 demonstrates sinus rhythm rate  63 bpm.  Prior anteroseptal infarct.  LAFB 11/18/16: Sinus rhythm.  Rate 64 bpm.  Low voltage limb and precordial leads 09/16/17: Sinus bradycardia rate 59 bpm.   04/22/18: sinus rhythm.  Rate 69 bpm.  Low voltage.  Prior anterioseptal infarct  Echo 11/01/15: LVEF >55%.  Mild concentric LVH. Mild anteroseptal and apical septal hypokinesis. Normal RV function.  Lexiscan Myoview 09/2017:  The left ventricular ejection fraction is mildly decreased (45-54%).  Nuclear stress EF: 48%.  There was no ST segment deviation noted during stress.  Defect 1: There is a small defect of severe severity present in the apical inferior, apical lateral and apex location.  This is  a low risk study.   Abnormal, low risk stress nuclear study with prior apical infarct; no ischemia; EF 48 with mild global hypokinesis; mild LVE.   Recent Labs: No results found for requested labs within last 8760 hours.    Lipid Panel    Component Value Date/Time   CHOL 133 04/14/2017 1523   TRIG 182.0 (H) 04/14/2017 1523   HDL 35.80 (L) 04/14/2017 1523   CHOLHDL 4 04/14/2017 1523   VLDL 36.4 04/14/2017 1523   LDLCALC 61 04/14/2017 1523    09/07/15: Sodium 138, potassium 4.5, BUN 20, creatinine 1.02 AST 17, ALT 24 Total cholesterol 113, triglycerides 146, HDL 32, LDL 52 TSH 1.76 WBC 11.7, hemoglobin 13.4, hematocrit 40.2, platelets 317 Hemoglobin A1c 8.9 on a relatively dry%  Wt Readings from Last 3 Encounters:  04/22/18 177 lb 9.6 oz (80.6 kg)  03/01/18 178 lb 3.2 oz (80.8 kg)  12/16/17 181 lb 9.6 oz (82.4 kg)     ASSESSMENT AND PLAN:  # Syncope: # Hypertension:    BP continues to be low.  This is contributing to his dizziness.  We will reduce carvedilol to 3.125 mg twice daily and losartan to 25 mg a day.  He has not had any recurrent syncope but is falling and is very lightheaded.  We will try to help get him a home health aid to assist with ADLs.   # CAD s/p cardiac arrest and LAD PCI: Chest pain  resolved.  Lexiscan Myoview was reassuring.  Continue aspirin, atorvastatin, and carvedilol.  # Hyperlipidemia.  LDL 61 on 04/2017.  Continue atorvastatin.  He will follow up for lipids/CMP.    Current medicines are reviewed at length with the patient today.  The patient does not have concerns regarding medicines.  The following changes have been made:  Reduce losartan and carvedilol.  Labs/ tests ordered today include:   Orders Placed This Encounter  Procedures  . Lipid panel  . Comprehensive metabolic panel  . EKG 12-Lead     Disposition:   FU with Cipriana Biller C. Oval Linsey, MD, [facility] in 6 weeks.     Signed, Coner Gibbard C. Oval Linsey, MD, [facility]  04/22/2018 4:47 PM    [facility]

## 2018-04-22 NOTE — Patient Instructions (Addendum)
Medication Instructions:  DECREASE YOUR CARVEDILOL TO 3.125 MG TWICE A DAY  DECREASE YOUR LOSARTAN TO 25 MG DAILY   Labwork: FASTING LP/CMET SOON  Testing/Procedures: NONE  Follow-Up: Your physician recommends that you schedule a follow-up appointment in: [place]   If you need a refill on your cardiac medications before your next appointment, please call your pharmacy.

## 2018-04-26 ENCOUNTER — Telehealth: Payer: Self-pay

## 2018-04-26 NOTE — Telephone Encounter (Signed)
Follow Up:     Please call, pt said he was expecting your call please.

## 2018-04-26 NOTE — Telephone Encounter (Signed)
Pt needs referral for home health care-ADLs. 2-3 days a week-for 2~hours . Main concern is hygiene. Would prefer a male aide. Will follow up with pt with referral

## 2018-04-28 ENCOUNTER — Telehealth: Payer: Self-pay

## 2018-04-28 ENCOUNTER — Other Ambulatory Visit: Payer: Self-pay | Admitting: Cardiovascular Disease

## 2018-04-28 NOTE — Telephone Encounter (Signed)
New Message   Patient is follow up in reference to his request for a home health aide. Please call.

## 2018-04-29 NOTE — Telephone Encounter (Signed)
Called patient and advised that Jeffrey [name] was not in office, but I would send a message. Patient verbalized understanding.

## 2018-04-29 NOTE — Telephone Encounter (Signed)
Per pt call he sated he would like Aleyah to call him back to get some information he was given right.  Please give him a call back.

## 2018-05-03 ENCOUNTER — Telehealth: Payer: Self-pay

## 2018-05-03 NOTE — Telephone Encounter (Signed)
Pt would like to move on to another home health service, due to [facility] not being able to fulfill male aide.

## 2018-05-04 ENCOUNTER — Ambulatory Visit (INDEPENDENT_AMBULATORY_CARE_PROVIDER_SITE_OTHER): Payer: PPO | Admitting: Family Medicine

## 2018-05-04 ENCOUNTER — Encounter: Payer: Self-pay | Admitting: Family Medicine

## 2018-05-04 ENCOUNTER — Telehealth: Payer: Self-pay

## 2018-05-04 VITALS — BP 114/72 | HR 79 | Temp 98.7°F | Ht 68.0 in | Wt 178.8 lb

## 2018-05-04 DIAGNOSIS — T733XXD Exhaustion due to excessive exertion, subsequent encounter: Secondary | ICD-10-CM | POA: Diagnosis not present

## 2018-05-04 DIAGNOSIS — E1159 Type 2 diabetes mellitus with other circulatory complications: Secondary | ICD-10-CM | POA: Diagnosis not present

## 2018-05-04 DIAGNOSIS — I10 Essential (primary) hypertension: Secondary | ICD-10-CM

## 2018-05-04 DIAGNOSIS — R5383 Other fatigue: Secondary | ICD-10-CM | POA: Diagnosis not present

## 2018-05-04 DIAGNOSIS — Z23 Encounter for immunization: Secondary | ICD-10-CM | POA: Diagnosis not present

## 2018-05-04 DIAGNOSIS — I152 Hypertension secondary to endocrine disorders: Secondary | ICD-10-CM

## 2018-05-04 MED ORDER — ZOSTER VAC RECOMB ADJUVANTED 50 MCG/0.5ML IM SUSR: 0.5000 mL | Freq: Once | INTRAMUSCULAR | 1 refills | Status: AC

## 2018-05-04 NOTE — Progress Notes (Signed)
   Subjective:  Jeffrey [name] is a 62 y.o. male who presents today with a chief complaint of fatigue.   HPI:  Fatigue, chronic problem, stable Several year history.  Patient has significant difficulty doing tasks around home due to fatigue and decreased energy.  Patient's brother is with him in the room and reports that he spends most of his day in his bathroom.  Patient has also fallen some recently and he is working with his cardiologist to suggest his blood pressure medications.  Symptoms are stable.  Occasionally feels guilty about having low energy levels.  Does not think he is depressed.  Depression screen PHQ 2/9 05/04/2018  Decreased Interest 1  Down, Depressed, Hopeless 1  PHQ - 2 Score 2  Altered sleeping 2  Tired, decreased energy 2  Change in appetite 0  Feeling bad or failure about yourself  1  Trouble concentrating 1  Moving slowly or fidgety/restless 0  Suicidal thoughts 0  PHQ-9 Score 8   Preventive health care Patient is interested in getting shingles vaccine.  ROS: Per HPI  PMH: He reports that he has never smoked. He has never used smokeless tobacco. He reports that he drinks about 1.0 standard drinks of alcohol per week. He reports that he does not use drugs.  Objective:  Physical Exam: BP 114/72 (BP Location: Right Arm, Patient Position: Sitting, Cuff Size: Normal)   Pulse 79   Temp 98.7 F (37.1 C) (Oral)   Ht 5\' 8"  (1.727 m)   Wt 178 lb 12.8 oz (81.1 kg)   SpO2 98%   BMI 27.19 kg/m   Gen: NAD, resting comfortably CV: RRR with no murmurs appreciated Pulm: NWOB, CTAB with no crackles, wheezes, or rhonchi Neuro: Grossly normal, moves all extremities Psych: Normal affect and thought content  Assessment/Plan:  Hypertension associated with diabetes ([name]) At goal with Coreg 3.125 mg daily, losartan 25 mg daily, and spironolactone 25 mg daily.  Follows up with cardiology next month.  Fatigue due to exertion His exertional fatigue is likely factorial  in setting of his cardiac history and low blood pressures.  He has had work-up in the past which was essentially negative.  Discussed possibility that he may be depressed, however patient adamantly denied this.  He is not interested in starting any antidepressant medications today.  We will continue with watchful waiting.  Encouraged patient stay as active as possible.  He has referral for home health pending.  Time Spent: I spent >25 minutes face-to-face with the patient, with more than half spent on counseling for preventative healthcare including shingles vaccine and flu vaccine, and possible etiologies for his fatigue.   [name]. [name], MD 05/04/2018 4:10 PM

## 2018-05-04 NOTE — Patient Instructions (Signed)
It was very nice to see you today!  I will order your shingles vaccine for your pharmacy.  No other changes today.  Come back to see me in 1 month for your next appointment, or sooner as needed.  Take care, Dr [name]

## 2018-05-04 NOTE — Assessment & Plan Note (Signed)
His exertional fatigue is likely factorial in setting of his cardiac history and low blood pressures.  He has had work-up in the past which was essentially negative.  Discussed possibility that he may be depressed, however patient adamantly denied this.  He is not interested in starting any antidepressant medications today.  We will continue with watchful waiting.  Encouraged patient stay as active as possible.  He has referral for home health pending.

## 2018-05-04 NOTE — Assessment & Plan Note (Addendum)
At goal with Coreg 3.125 mg daily, losartan 25 mg daily, and spironolactone 25 mg daily.  Follows up with cardiology next month.

## 2018-05-04 NOTE — Telephone Encounter (Signed)
New Message   Patient is calling in reference to the [place] referral. He also wanted to let you know he will be at an appointment this afternoon in the event that you call and can not reach him. You can leave a detail vm and he should be home around 3pm.

## 2018-05-05 ENCOUNTER — Telehealth: Payer: Self-pay | Admitting: Cardiovascular Disease

## 2018-05-05 NOTE — Telephone Encounter (Signed)
New Message         Patient is calling back again today for help with getting a male aid. Pls call and advise

## 2018-05-05 NOTE — Telephone Encounter (Signed)
Follow up:   Patient calling wanting to know the status of paper work. Please call patient.

## 2018-05-06 NOTE — Telephone Encounter (Signed)
Message routed to Care Guide as she has been working to address this for patient.

## 2018-05-07 NOTE — Telephone Encounter (Signed)
Called pt with update on home health referral

## 2018-05-10 ENCOUNTER — Other Ambulatory Visit: Payer: Self-pay

## 2018-05-10 NOTE — Patient Outreach (Signed)
[facility]) Care Management  05/10/2018  Wayde Gopaul 07-26-1956 [id number]   Referral Date: 05/10/18 Referral Source: HTA Concierge Referral Reason: [place], shower chair   Outreach Attempt: No answer.  HIPAA compliant voice message left.     Plan:RN CM will send letter and attempt patient again within 4 business days.    Jone Baseman, RN, MSN [facility] Care Management Care Management Coordinator Direct Line [phone] Toll Free: [phone]  Fax: [phone]

## 2018-05-11 ENCOUNTER — Ambulatory Visit: Payer: Self-pay

## 2018-05-11 ENCOUNTER — Other Ambulatory Visit: Payer: Self-pay

## 2018-05-11 NOTE — Patient Outreach (Addendum)
[facility]) Care Management  05/11/2018  Jeffrey [name] 04-03-1956 [id number]   Referral Date: 05/10/18 Referral Source: HTA Concierge Referral Reason: [place], shower chair   Outreach Attempt: Incoming call from patient.  He is able to verify HIPAA.  Discussed with patient reason for call.  Patient reports that he needs [place] to assist with bathing and dressing.  Discussed with patient options for home health aide.  Patient states he cannot afford to pay out of pocket and that his doctor's office is working to assist with finding someone.  Patient states he had therapy ordered but he states he did not need them to show him how to use equipment and bathe himself so they are no longer involved.  Asked patient if he ever applied for medicaid.  He states he was told he makes too much to qualify.   Patient states he would rather wait for result from his 85 office for now.  CM verbalized understanding.  Patient has CM contact information for future reference.    Plan: RN CM will close case.    Jone Baseman, RN, MSN [place] Management Care Management Coordinator Direct Line [phone] Cell [phone] Toll Free: [phone]  Fax: [phone]

## 2018-05-14 ENCOUNTER — Ambulatory Visit: Payer: PPO

## 2018-05-24 ENCOUNTER — Encounter: Payer: Self-pay | Admitting: Family Medicine

## 2018-05-24 ENCOUNTER — Ambulatory Visit (INDEPENDENT_AMBULATORY_CARE_PROVIDER_SITE_OTHER): Payer: PPO | Admitting: Family Medicine

## 2018-05-24 VITALS — BP 124/74 | HR 85 | Temp 97.9°F | Ht 68.0 in | Wt 179.8 lb

## 2018-05-24 DIAGNOSIS — E1169 Type 2 diabetes mellitus with other specified complication: Secondary | ICD-10-CM | POA: Diagnosis not present

## 2018-05-24 DIAGNOSIS — Z0001 Encounter for general adult medical examination with abnormal findings: Secondary | ICD-10-CM | POA: Diagnosis not present

## 2018-05-24 DIAGNOSIS — Z794 Long term (current) use of insulin: Secondary | ICD-10-CM

## 2018-05-24 DIAGNOSIS — IMO0001 Reserved for inherently not codable concepts without codable children: Secondary | ICD-10-CM

## 2018-05-24 DIAGNOSIS — Z125 Encounter for screening for malignant neoplasm of prostate: Secondary | ICD-10-CM

## 2018-05-24 DIAGNOSIS — R5381 Other malaise: Secondary | ICD-10-CM | POA: Diagnosis not present

## 2018-05-24 DIAGNOSIS — E118 Type 2 diabetes mellitus with unspecified complications: Secondary | ICD-10-CM

## 2018-05-24 DIAGNOSIS — E785 Hyperlipidemia, unspecified: Secondary | ICD-10-CM

## 2018-05-24 LAB — CBC
HCT: 38.2 % — ABNORMAL LOW (ref 39.0–52.0)
Hemoglobin: 13 g/dL (ref 13.0–17.0)
MCHC: 33.9 g/dL (ref 30.0–36.0)
MCV: 93.4 fl (ref 78.0–100.0)
Platelets: 385 10*3/uL (ref 150.0–400.0)
RBC: 4.09 Mil/uL — ABNORMAL LOW (ref 4.22–5.81)
RDW: 13.2 % (ref 11.5–15.5)
WBC: 11.9 10*3/uL — ABNORMAL HIGH (ref 4.0–10.5)

## 2018-05-24 LAB — COMPREHENSIVE METABOLIC PANEL
ALT: 18 U/L (ref 0–53)
AST: 16 U/L (ref 0–37)
Albumin: 4.5 g/dL (ref 3.5–5.2)
Alkaline Phosphatase: 65 U/L (ref 39–117)
BUN: 39 mg/dL — ABNORMAL HIGH (ref 6–23)
CO2: 27 mEq/L (ref 19–32)
Calcium: 9.5 mg/dL (ref 8.4–10.5)
Chloride: 100 mEq/L (ref 96–112)
Creatinine, Ser: 1.49 mg/dL (ref 0.40–1.50)
GFR: 50.54 mL/min — ABNORMAL LOW (ref >60.00)
Glucose, Bld: 161 mg/dL — ABNORMAL HIGH (ref 70–99)
Potassium: 4.8 mEq/L (ref 3.5–5.1)
Sodium: 137 mEq/L (ref 135–145)
Total Bilirubin: 0.6 mg/dL (ref 0.2–1.2)
Total Protein: 7.4 g/dL (ref 6.0–8.3)

## 2018-05-24 LAB — LIPID PANEL
Cholesterol: 117 mg/dL (ref 0–200)
HDL: 29.4 mg/dL — ABNORMAL LOW (ref >39.00)
LDL Cholesterol: 53 mg/dL (ref 0–99)
NonHDL: 87.93
Total CHOL/HDL Ratio: 4
Triglycerides: 174 mg/dL — ABNORMAL HIGH (ref 0.0–149.0)
VLDL: 34.8 mg/dL (ref 0.0–40.0)

## 2018-05-24 LAB — PSA: PSA: 1.6 ng/mL (ref 0.10–4.00)

## 2018-05-24 NOTE — Assessment & Plan Note (Signed)
Continue atorvastatin 80 mg daily.  Check lipid panel. 

## 2018-05-24 NOTE — Assessment & Plan Note (Signed)
Order for shower chair place.  Also placed order for home health aide 2-3 times a week for 1 to 2 hours to help with bathing.

## 2018-05-24 NOTE — Patient Instructions (Signed)
It was very nice to see you today!  We will check blood work today.  I place an order for a home health aide and a shower chair.  Take care, Dr [name]   Preventive Care 40-64 Years, Male Preventive care refers to lifestyle choices and visits with your health care provider that can promote health and wellness. What does preventive care include?  A yearly physical exam. This is also called an annual well check.  Dental exams once or twice a year.  Routine eye exams. Ask your health care provider how often you should have your eyes checked.  Personal lifestyle choices, including: ? Daily care of your teeth and gums. ? Regular physical activity. ? Eating a healthy diet. ? Avoiding tobacco and drug use. ? Limiting alcohol use. ? Practicing safe sex. ? Taking low-dose aspirin every day starting at age 62. What happens during an annual well check? The services and screenings done by your health care provider during your annual well check will depend on your age, overall health, lifestyle risk factors, and family history of disease. Counseling Your health care provider may ask you questions about your:  Alcohol use.  Tobacco use.  Drug use.  Emotional well-being.  Home and relationship well-being.  Sexual activity.  Eating habits.  Work and work Statistician.  Screening You may have the following tests or measurements:  Height, weight, and BMI.  Blood pressure.  Lipid and cholesterol levels. These may be checked every 5 years, or more frequently if you are over 13 years old.  Skin check.  Lung cancer screening. You may have this screening every year starting at age 49 if you have a 30-pack-year history of smoking and currently smoke or have quit within the past 15 years.  Fecal occult blood test (FOBT) of the stool. You may have this test every year starting at age 66.  Flexible sigmoidoscopy or colonoscopy. You may have a sigmoidoscopy every 5 years or a  colonoscopy every 10 years starting at age 12.  Prostate cancer screening. Recommendations will vary depending on your family history and other risks.  Hepatitis C blood test.  Hepatitis B blood test.  Sexually transmitted disease (STD) testing.  Diabetes screening. This is done by checking your blood sugar (glucose) after you have not eaten for a while (fasting). You may have this done every 1-3 years.  Discuss your test results, treatment options, and if necessary, the need for more tests with your health care provider. Vaccines Your health care provider may recommend certain vaccines, such as:  Influenza vaccine. This is recommended every year.  Tetanus, diphtheria, and acellular pertussis (Tdap, Td) vaccine. You may need a Td booster every 10 years.  Varicella vaccine. You may need this if you have not been vaccinated.  Zoster vaccine. You may need this after age 10.  Measles, mumps, and rubella (MMR) vaccine. You may need at least one dose of MMR if you were born in 1957 or later. You may also need a second dose.  Pneumococcal 13-valent conjugate (PCV13) vaccine. You may need this if you have certain conditions and have not been vaccinated.  Pneumococcal polysaccharide (PPSV23) vaccine. You may need one or two doses if you smoke cigarettes or if you have certain conditions.  Meningococcal vaccine. You may need this if you have certain conditions.  Hepatitis A vaccine. You may need this if you have certain conditions or if you travel or work in places where you may be exposed to hepatitis A.  Hepatitis B vaccine. You may need this if you have certain conditions or if you travel or work in places where you may be exposed to hepatitis B.  Haemophilus influenzae type b (Hib) vaccine. You may need this if you have certain risk factors.  Talk to your health care provider about which screenings and vaccines you need and how often you need them. This information is not intended to  replace advice given to you by your health care provider. Make sure you discuss any questions you have with your health care provider. Document Released: 08/24/2015 Document Revised: 04/16/2016 Document Reviewed: 05/29/2015 Elsevier Interactive Patient Education  [name].

## 2018-05-24 NOTE — Progress Notes (Signed)
Subjective:  Jeffrey [name] is a 62 y.o. male who presents today for his annual comprehensive physical exam.    HPI:  He has no acute complaints today.   He has a history of physical debility related to a cardiac arrest a few years ago.  Symptoms have worsened recently.  Now requires assistance with bathing and personal hygiene.  Becomes very fatigued very easily with minimal exertion.  Lifestyle Diet: Tries to eat a healthy and balanced diet.  Exercise: Limited physical activity due to debility.   Depression screen PHQ 2/9 05/04/2018  Decreased Interest 1  Down, Depressed, Hopeless 1  PHQ - 2 Score 2  Altered sleeping 2  Tired, decreased energy 2  Change in appetite 0  Feeling bad or failure about yourself  1  Trouble concentrating 1  Moving slowly or fidgety/restless 0  Suicidal thoughts 0  PHQ-9 Score 8    Health Maintenance Due  Topic Date Due  . TETANUS/TDAP  11/14/1973  . COLONOSCOPY  11/14/2004     ROS: Per HPI, otherwise a complete review of systems was negative.   PMH:  The following were reviewed and entered/updated in epic: Past Medical History:  Diagnosis Date  . Cardiac arrest ([place]) 05/22/2016  . CHF (congestive heart failure) ([place])   . Diabetes mellitus without complication ([place])   . Hyperlipidemia   . Hypertension   . Myocardial infarct ([place]) 2105  . S/P primary angioplasty with coronary stent 05/22/2016   Patient Active Problem List   Diagnosis Date Noted  . Physical debility 05/24/2018  . Varicose veins of both lower extremities 03/01/2018  . Rhinorrhea 12/16/2017  . Fatigue due to exertion 10/14/2017  . GERD (gastroesophageal reflux disease) 08/25/2017  . CAD S/P percutaneous coronary angioplasty 04/14/2017  . Hyperlipidemia associated with type 2 diabetes mellitus ([place]) 04/14/2017  . Diabetic peripheral neuropathy associated with type 2 diabetes mellitus ([place]) 08/17/2016  . S/P primary angioplasty with coronary stent 05/22/2016  .  History of cardiac arrest 05/22/2016  . Insulin dependent diabetes mellitus with complications ([place]) 66/29/4765  . Hypertension associated with diabetes ([place]) 05/02/2016   History reviewed. No pertinent surgical history.  Family History  Problem Relation Age of Onset  . Alzheimer's disease Mother   . Heart disease Father   . Peripheral Artery Disease Father   . Heart disease Brother     Medications- reviewed and updated Current Outpatient Medications  Medication Sig Dispense Refill  . aspirin EC 81 MG tablet Take 81 mg by mouth daily.    [name] Kitchen atorvastatin (LIPITOR) 80 MG tablet Take 1 tablet (80 mg total) by mouth daily. 30 tablet 11  . Blood Glucose Monitoring Suppl (ONETOUCH VERIO IQ SYSTEM) w/Device KIT 1 kit by Does not apply route 4 (four) times daily. 1 kit 0  . carvedilol (COREG) 3.125 MG tablet Take 1 tablet (3.125 mg total) by mouth 2 (two) times daily with a meal. 180 tablet 1  . cholecalciferol (VITAMIN D) 1000 units tablet Take 2,000 Units by mouth daily.    . furosemide (LASIX) 40 MG tablet Take 1 tablet (40 mg total) by mouth daily. 30 tablet 11  . glucose blood test strip Check blood sugar 4 times per day 200 each 11  . insulin glargine (LANTUS) 100 UNIT/ML injection Inject 15-28 units twice daily. 30 mL 11  . Insulin Syringe-Needle U-100 (B-D INS SYR HALF-UNIT .3CC/31G) 31G X 5/16" 0.3 ML MISC Use 6 times a day 200 each 11  . ipratropium (ATROVENT) 0.06 %  nasal spray Place 2 sprays into both nostrils 4 (four) times daily. 15 mL 12  . Lancets (ONETOUCH ULTRASOFT) lancets Check blood sugar 4 times per day 200 each 11  . losartan (COZAAR) 25 MG tablet Take 1 tablet (25 mg total) by mouth daily. 90 tablet 1  . losartan (COZAAR) 50 MG tablet TAKE 1/2 TABLETS (25 MG TOTAL) BY MOUTH 2 (TWO) TIMES DAILY. 90 tablet 2  . metFORMIN (GLUCOPHAGE XR) 500 MG 24 hr tablet Take 2 tablets (1,000 mg total) by mouth 2 (two) times daily. 120 tablet 11  . metoCLOPramide (REGLAN) 10 MG tablet  Take 1 tablet (10 mg total) by mouth 4 (four) times daily. 360 tablet 3  . omeprazole (PRILOSEC) 40 MG capsule Take 1 capsule (40 mg total) by mouth 2 (two) times daily. 180 capsule 3  . spironolactone (ALDACTONE) 25 MG tablet TAKE 1 TABLET BY MOUTH EVERY DAY 90 tablet 1   No current facility-administered medications for this visit.     Allergies-reviewed and updated Allergies  Allergen Reactions  . Codeine Nausea And Vomiting  . Penicillins Rash    Social History   Socioeconomic History  . Marital status: Single    Spouse name: Not on file  . Number of children: Not on file  . Years of education: Not on file  . Highest education level: Not on file  Occupational History  . Not on file  Social Needs  . Financial resource strain: Not on file  . Food insecurity:    Worry: Not on file    Inability: Not on file  . Transportation needs:    Medical: Not on file    Non-medical: Not on file  Tobacco Use  . Smoking status: Never Smoker  . Smokeless tobacco: Never Used  Substance and Sexual Activity  . Alcohol use: Yes    Alcohol/week: 1.0 standard drinks    Types: 1 Cans of beer per week    Comment: occ  . Drug use: No  . Sexual activity: Never  Lifestyle  . Physical activity:    Days per week: Not on file    Minutes per session: Not on file  . Stress: Not on file  Relationships  . Social connections:    Talks on phone: Not on file    Gets together: Not on file    Attends religious service: Not on file    Active member of club or organization: Not on file    Attends meetings of clubs or organizations: Not on file    Relationship status: Not on file  Other Topics Concern  . Not on file  Social History Narrative  . Not on file    Objective:  Physical Exam: BP 124/74 (BP Location: Left Arm, Patient Position: Sitting, Cuff Size: Normal)   Pulse 85   Temp 97.9 F (36.6 C) (Oral)   Ht _0  (1.727 m)   Wt 179 lb 12.8 oz (81.6 kg)   SpO2 98%   BMI 27.34 kg/m     Body mass index is 27.34 kg/m. Wt Readings from Last 3 Encounters:  05/24/18 179 lb 12.8 oz (81.6 kg)  05/04/18 178 lb 12.8 oz (81.1 kg)  04/22/18 177 lb 9.6 oz (80.6 kg)   Gen: NAD, resting comfortably HEENT: TMs normal bilaterally. OP clear. No thyromegaly noted.  CV: RRR with no murmurs appreciated Pulm: NWOB, CTAB with no crackles, wheezes, or rhonchi GI: Normal bowel sounds present. Soft, Nontender, Nondistended. MSK: no edema, cyanosis, or clubbing  noted Skin: warm, dry Neuro: CN2-12 grossly intact. Strength 5/5 in upper and lower extremities. Reflexes symmetric and intact bilaterally.  Psych: Normal affect and thought content  Assessment/Plan:  Insulin dependent diabetes mellitus with complications ([place]) He will follow-up next week for A1c testing.  Continue current doses of metformin and Lantus.  Check CBC and CMP.  Hyperlipidemia associated with type 2 diabetes mellitus ([name]) Continue atorvastatin 80 mg daily.  Check lipid panel.  Physical debility Order for shower chair place.  Also placed order for home health aide 2-3 times a week for 1 to 2 hours to help with bathing.  Preventative Healthcare: Check PSA.  Obtain records regarding his colonoscopy.  Patient Counseling(The following topics were reviewed and/or handout was given):  -Nutrition: Stressed importance of moderation in sodium/caffeine intake, saturated fat and cholesterol, caloric balance, sufficient intake of fresh fruits, vegetables, and fiber.  -Stressed the importance of regular exercise.   -Substance Abuse: Discussed cessation/primary prevention of tobacco, alcohol, or other drug use; driving or other dangerous activities under the influence; availability of treatment for abuse.   -Injury prevention: Discussed safety belts, safety helmets, smoke detector, smoking near bedding or upholstery.   -Sexuality: Discussed sexually transmitted diseases, partner selection, use of condoms, avoidance of unintended  pregnancy and contraceptive alternatives.   -Dental health: Discussed importance of regular tooth brushing, flossing, and dental visits.  -Health maintenance and immunizations reviewed. Please refer to Health maintenance section.  Return to care in 1 year for next preventative visit.   Jeffrey [name]. [name], MD 05/24/2018 10:19 AM

## 2018-05-24 NOTE — Assessment & Plan Note (Signed)
He will follow-up next week for A1c testing.  Continue current doses of metformin and Lantus.  Check CBC and CMP.

## 2018-05-26 ENCOUNTER — Telehealth: Payer: Self-pay | Admitting: Family Medicine

## 2018-05-26 NOTE — Progress Notes (Signed)
Please inform patient of the following:  Blood counts are stable.  It looks a like he is a bit dehydrated, but his electrolytes and liver function are all stable. Cholesterol levels and blood sugar levels are stable.  He should make sure that he is getting plenty of fluids and we can recheck his kidney numbers when he comes back. We will also check his A1c at that time.  No other changes needed at this time.   Jeffrey [name]. [name], MD 05/26/2018 10:38 AM

## 2018-05-26 NOTE — Telephone Encounter (Signed)
Copied from [place] [phone]. Topic: Quick Communication - See Telephone Encounter >> May 26, 2018  1:49 PM Berneta Levins wrote: CRM for notification. See Telephone encounter for: 05/26/18.  Pt states he was under the impression that a referral for a shower chair was going to be called in for him.  Pt states he hasn't heard from anyone about that. Pt can be reached at [phone]

## 2018-05-26 NOTE — Telephone Encounter (Signed)
Order for shower chair was faxed to [facility] on 05/24/2018 (date of patient's office visit).  Patient is welcome to contact [facility] himself [phone]) however, they should be contacting him soon.

## 2018-05-27 NOTE — Telephone Encounter (Signed)
LM for patient to return call.

## 2018-05-31 ENCOUNTER — Other Ambulatory Visit: Payer: Self-pay | Admitting: Family Medicine

## 2018-05-31 NOTE — Telephone Encounter (Signed)
Copied from [place] [id number]. Topic: Quick Communication - See Telephone Encounter >> May 31, 2018 12:52 PM Conception Chancy, NT wrote: CRM for notification. See Telephone encounter for: 05/31/18.  [facility] and states they have sent over a refill request on 10/17 and 10/18 for omeprazole (PRILOSEC) 40 MG capsule. Please advise as patient is needing refill.  [id number] [address], [place] [address] Phone: [phone] Fax: [phone]

## 2018-05-31 NOTE — Telephone Encounter (Signed)
See note

## 2018-05-31 NOTE — Telephone Encounter (Signed)
[facility] vm and advised pt should have enough meds to last until 08/2018. Last Rx 08/2017 #180 3R

## 2018-06-01 ENCOUNTER — Ambulatory Visit: Payer: Self-pay | Admitting: Family Medicine

## 2018-06-01 ENCOUNTER — Other Ambulatory Visit: Payer: Self-pay

## 2018-06-01 ENCOUNTER — Telehealth: Payer: Self-pay | Admitting: Family Medicine

## 2018-06-01 DIAGNOSIS — Z955 Presence of coronary angioplasty implant and graft: Secondary | ICD-10-CM

## 2018-06-01 DIAGNOSIS — R5381 Other malaise: Secondary | ICD-10-CM

## 2018-06-01 DIAGNOSIS — Z8674 Personal history of sudden cardiac arrest: Secondary | ICD-10-CM

## 2018-06-01 MED ORDER — OMEPRAZOLE 40 MG PO CPDR: 40.0000 mg | DELAYED_RELEASE_CAPSULE | Freq: Two times a day (BID) | ORAL | 3 refills | Status: DC

## 2018-06-01 NOTE — Telephone Encounter (Signed)
Follow up  Called and spoke with Jeffrey [name], asked him to have Jeffrey [name] call back and reschedule the canceled apt.  Copied from [place] [phone]. Topic: Quick Communication - Appointment Cancellation >> Jun 01, 2018  7:56 AM Jeffrey [name] wrote: Patient called to cancel appointment scheduled for [facility] Patient  HAS [id number]} rescheduled their appointment. Pt unable to keep his 1 pm appt today. Pt is having his car service this afternoon. Please call pt back on mobile number  [facility] pool.

## 2018-06-01 NOTE — Telephone Encounter (Signed)
See note  Copied from [place] [phone]. Topic: General - Other >> Jun 01, 2018 12:23 PM Judyann Munson wrote: Reason for CRM: patient is calling upset that a shower chair was never called in for him. Advise patient of the notes stating the fax was sent on 05-24-18 to [facility]. The patient stated we should have sent the chair request over the advance home care. The patient stated he is thinking about transferring service for Dr.Parker because he is exteremly unhappy. The patient hung up before I could see if they would like for me to put a request in for a Shower chair.

## 2018-06-01 NOTE — Telephone Encounter (Signed)
Rx for shower chair faxed to [facility].

## 2018-06-01 NOTE — Telephone Encounter (Signed)
LM for patient to return call.  Patient did not state a preference as to where he wanted the request for the shower chair sent.  I have not been in the office since 05/27/2018, so I was unaware that the patient still has not received his shower chair.  I have faxed a request to  and I have asked them to contact the patient right away.  CRM started.

## 2018-06-02 ENCOUNTER — Other Ambulatory Visit: Payer: Self-pay

## 2018-06-02 ENCOUNTER — Ambulatory Visit: Payer: PPO | Admitting: Cardiovascular Disease

## 2018-06-02 DIAGNOSIS — Z955 Presence of coronary angioplasty implant and graft: Secondary | ICD-10-CM

## 2018-06-02 DIAGNOSIS — Z9861 Coronary angioplasty status: Secondary | ICD-10-CM

## 2018-06-02 DIAGNOSIS — E1142 Type 2 diabetes mellitus with diabetic polyneuropathy: Secondary | ICD-10-CM

## 2018-06-02 DIAGNOSIS — T733XXD Exhaustion due to excessive exertion, subsequent encounter: Secondary | ICD-10-CM

## 2018-06-02 DIAGNOSIS — Z8674 Personal history of sudden cardiac arrest: Secondary | ICD-10-CM

## 2018-06-02 DIAGNOSIS — I2510 Atherosclerotic heart disease of native coronary artery without angina pectoris: Secondary | ICD-10-CM

## 2018-06-02 MED ORDER — QUAD CANE/SMALL BASE MISC: 1.0000 [IU] | Freq: Once | 0 refills | Status: AC

## 2018-06-02 MED ORDER — BATH BENCH WITH BACK MISC: 1.0000 [IU] | Freq: Once | 0 refills | Status: AC

## 2018-06-02 NOTE — Telephone Encounter (Signed)
Rx for shower chair, quad cane, and tub seat faxed to [facility].

## 2018-06-03 ENCOUNTER — Ambulatory Visit (INDEPENDENT_AMBULATORY_CARE_PROVIDER_SITE_OTHER): Payer: PPO | Admitting: Cardiovascular Disease

## 2018-06-03 ENCOUNTER — Encounter: Payer: Self-pay | Admitting: Cardiovascular Disease

## 2018-06-03 VITALS — BP 116/68 | HR 63 | Ht 68.0 in | Wt 178.0 lb

## 2018-06-03 DIAGNOSIS — Z9861 Coronary angioplasty status: Secondary | ICD-10-CM

## 2018-06-03 DIAGNOSIS — R55 Syncope and collapse: Secondary | ICD-10-CM

## 2018-06-03 DIAGNOSIS — I2510 Atherosclerotic heart disease of native coronary artery without angina pectoris: Secondary | ICD-10-CM | POA: Diagnosis not present

## 2018-06-03 DIAGNOSIS — I119 Hypertensive heart disease without heart failure: Secondary | ICD-10-CM

## 2018-06-03 DIAGNOSIS — E785 Hyperlipidemia, unspecified: Secondary | ICD-10-CM

## 2018-06-03 NOTE — Progress Notes (Signed)
Cardiology Office Note   Date:  06/03/2018   ID:  Jeffrey [name], DOB April 10, 1956, MRN [id number]  PCP:  Vivi Barrack, MD  Cardiologist:   Skeet Latch, MD   Chief Complaint  Patient presents with  . Follow-up     History of Present Illness: Jeffrey [name] is a 62 y.o. male with hypertension, hyperlipidemia, diabetes and peripheral neuropathy who presents for follow up.  Jeffrey [name] recently moved to [facility] from [place].  He moved here with his identical twin brother.  Jeffrey [name] had a cardiac arrest 11/23/13 and underwent PCI of the LAD.  Echo 10/2015 revealed an anteroseptal and apical septal infarct with associated hypokinesis.  LVEF was 55%.  He had a PET CT/myocardial perfusion scan 10/2015 that revealed a moderate sized, moderate intensity antero-apical infarct with mild ischemia.  At the end of 2016 he had an episode of stabbing chest pain.  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  At that time his clopidogrel was discontinued and he underwent several laser procedures.  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    Jeffrey [name] reported atypical chest pain.  He was referred for [facility] 09/2017 that revealed LVEF 48% and a prior infarct in the apical inferior, apical lateral, and apical regions.  There was no ischemia.  He has struggled with syncope and his antihypertensives were reduced at prior appointments.  At his last appointment Jeffrey [name] was still feeling dizzy so losartan and carvedilol was both reduced.  We also arrange for home health services.  He only was able ot aford 2 hours per week but the agencies required 6 hour minimums.  Therefore he decided not to get services.  He has been working with his primary care provider and will get a shower chair and a cane.  He notices that he is unsteady when on his feet.  He has not had any falls  lately.  He also continues to get some dizziness when in the shower but otherwise has been feeling well.  He has no chest pain and his breathing has been stable.  He has mild lower extremity edema that improves with elevation of his legs.  He denies orthopnea or PND. He  Past Medical History:  Diagnosis Date  . Cardiac arrest ([place]) 05/22/2016  . CHF (congestive heart failure) ([place])   . Diabetes mellitus without complication ([place])   . Hyperlipidemia   . Hypertension   . Myocardial infarct ([place]) 2105  . S/P primary angioplasty with coronary stent 05/22/2016    History reviewed. No pertinent surgical history.   Current Outpatient Medications  Medication Sig Dispense Refill  . aspirin EC 81 MG tablet Take 81 mg by mouth daily.    [name] Kitchen atorvastatin (LIPITOR) 80 MG tablet Take 1 tablet (80 mg total) by mouth daily. 30 tablet 11  . Blood Glucose Monitoring Suppl (ONETOUCH VERIO IQ SYSTEM) w/Device KIT 1 kit by Does not apply route 4 (four) times daily. 1 kit 0  . carvedilol (COREG) 3.125 MG tablet Take 1 tablet (3.125 mg total) by mouth 2 (two) times daily with a meal. 180 tablet 1  . cholecalciferol (VITAMIN D) 1000 units tablet Take 2,000 Units by mouth daily.    . furosemide (LASIX) 40 MG tablet Take 1 tablet (40 mg total) by mouth daily. 30 tablet 11  .  glucose blood test strip Check blood sugar 4 times per day 200 each 11  . insulin glargine (LANTUS) 100 UNIT/ML injection Inject 15-28 units twice daily. 30 mL 11  . Insulin Syringe-Needle U-100 (B-D INS SYR HALF-UNIT .3CC/31G) 31G X 5/16" 0.3 ML MISC Use 6 times a day 200 each 11  . ipratropium (ATROVENT) 0.06 % nasal spray Place 2 sprays into both nostrils 4 (four) times daily. 15 mL 12  . Lancets (ONETOUCH ULTRASOFT) lancets Check blood sugar 4 times per day 200 each 11  . losartan (COZAAR) 25 MG tablet Take 1 tablet (25 mg total) by mouth daily. 90 tablet 1  . metFORMIN (GLUCOPHAGE XR) 500 MG 24 hr tablet Take 2 tablets (1,000 mg total)  by mouth 2 (two) times daily. 120 tablet 11  . metoCLOPramide (REGLAN) 10 MG tablet Take 1 tablet (10 mg total) by mouth 4 (four) times daily. 360 tablet 3  . omeprazole (PRILOSEC) 40 MG capsule Take 1 capsule (40 mg total) by mouth 2 (two) times daily. 180 capsule 3  . spironolactone (ALDACTONE) 25 MG tablet TAKE 1 TABLET BY MOUTH EVERY DAY 90 tablet 1   No current facility-administered medications for this visit.     Allergies:   Codeine and Penicillins    Social History:  The patient  reports that he has never smoked. He has never used smokeless tobacco. He reports that he drinks about 1.0 standard drinks of alcohol per week. He reports that he does not use drugs.   Family History:  The patient's family history includes Alzheimer's disease in his mother; Heart disease in his brother and father; Peripheral Artery Disease in his father.    ROS:  Please see the history of present illness.   Otherwise, review of systems are positive for cold hands.   All other systems are reviewed and negative.    PHYSICAL EXAM: VS:  BP 116/68   Pulse 63   Ht '5\' 8"'  (1.727 m)   Wt 178 lb (80.7 kg)   BMI 27.06 kg/m  , BMI Body mass index is 27.06 kg/m. GENERAL:  Well appearing HEENT: Pupils equal round and reactive, fundi not visualized, oral mucosa unremarkable NECK:  No jugular venous distention, waveform within normal limits, carotid upstroke brisk and symmetric, no bruits LUNGS:  Clear to auscultation bilaterally HEART:  RRR.  PMI not displaced or sustained,S1 and S2 within normal limits, no S3, no S4, no clicks, no rubs, no murmurs ABD:  Flat, positive bowel sounds normal in frequency in pitch, no bruits, no rebound, no guarding, no midline pulsatile mass, no hepatomegaly, no splenomegaly EXT:  2 plus pulses throughout, no edema, no cyanosis no clubbing SKIN:  No rashes no nodules NEURO:  Cranial nerves II through XII grossly intact, motor grossly intact throughout PSYCH:  Cognitively intact,  oriented to person place and time   EKG:  EKG is not ordered today. The ekg ordered 05/22/16 demonstrates sinus rhythm rate 63 bpm.  Prior anteroseptal infarct.  LAFB 11/18/16: Sinus rhythm.  Rate 64 bpm.  Low voltage limb and precordial leads 09/16/17: Sinus bradycardia rate 59 bpm.   04/22/18: sinus rhythm.  Rate 69 bpm.  Low voltage.  Prior anterioseptal infarct  Echo 11/01/15: LVEF >55%.  Mild concentric LVH. Mild anteroseptal and apical septal hypokinesis. Normal RV function.  Lexiscan Myoview 09/2017:  The left ventricular ejection fraction is mildly decreased (45-54%).  Nuclear stress EF: 48%.  There was no ST segment deviation noted during stress.  Defect 1:  There is a small defect of severe severity present in the apical inferior, apical lateral and apex location.  This is a low risk study.   Abnormal, low risk stress nuclear study with prior apical infarct; no ischemia; EF 48 with mild global hypokinesis; mild LVE.   Recent Labs: 05/24/2018: ALT 18; BUN 39; Creatinine, Ser 1.49; Hemoglobin 13.0; Platelets 385.0; Potassium 4.8; Sodium 137    Lipid Panel    Component Value Date/Time   CHOL 117 05/24/2018 0923   TRIG 174.0 (H) 05/24/2018 0923   HDL 29.40 (L) 05/24/2018 0923   CHOLHDL 4 05/24/2018 0923   VLDL 34.8 05/24/2018 0923   LDLCALC 53 05/24/2018 0923    09/07/15: Sodium 138, potassium 4.5, BUN 20, creatinine 1.02 AST 17, ALT 24 Total cholesterol 113, triglycerides 146, HDL 32, LDL 52 TSH 1.76 WBC 11.7, hemoglobin 13.4, hematocrit 40.2, platelets 317 Hemoglobin A1c 8.9 on a relatively dry%  Wt Readings from Last 3 Encounters:  06/03/18 178 lb (80.7 kg)  05/24/18 179 lb 12.8 oz (81.6 kg)  05/04/18 178 lb 12.8 oz (81.1 kg)     ASSESSMENT AND PLAN:  # Syncope: # Hypertension:    BP is stable and his dizziness has improved.  He will continue carvedilol, losartan, furosemide and spironolactone.    # CAD s/p cardiac arrest and LAD PCI: Chest pain  resolved.  Lexiscan Myoview was negative for ischemia 09/2017.  Continue aspirin, atorvastatin, and carvedilol.     # Hyperlipidemia.  LDL 53 05/2018. Continue atorvastatin.     Current medicines are reviewed at length with the patient today.  The patient does not have concerns regarding medicines.  The following changes have been made: none Labs/ tests ordered today include:   No orders of the defined types were placed in this encounter.    Disposition:   FU with Irven Ingalsbe C. Oval Linsey, MD, [facility] in 6 months.    Signed, Natalio Salois C. Oval Linsey, MD, [facility]  06/03/2018 8:32 AM    [facility]

## 2018-06-03 NOTE — Patient Instructions (Signed)
Medication Instructions:  Your physician recommends that you continue on your current medications as directed. Please refer to the Current Medication list given to you today.  If you need a refill on your cardiac medications before your next appointment, please call your pharmacy.   Lab work: NONE   Testing/Procedures: NONE  Follow-Up: At [facility], you and your health needs are our priority.  As part of our continuing mission to provide you with exceptional heart care, we have created designated Provider Care Teams.  These Care Teams include your primary Cardiologist (physician) and Advanced Practice Providers (APPs -  Physician Assistants and Nurse Practitioners) who all work together to provide you with the care you need, when you need it. You will need a follow up appointment in 6 months.  Please call our office 2 months in advance to schedule this appointment.  You may see DR [facility] or one of the following Advanced Practice Providers on your designated Care Team:   Kerin Ransom, PA-C Roby Lofts, [place] . Sande Rives, PA-C

## 2018-06-04 DIAGNOSIS — R5381 Other malaise: Secondary | ICD-10-CM | POA: Diagnosis not present

## 2018-06-08 ENCOUNTER — Telehealth: Payer: Self-pay | Admitting: Family Medicine

## 2018-06-08 ENCOUNTER — Ambulatory Visit (INDEPENDENT_AMBULATORY_CARE_PROVIDER_SITE_OTHER): Payer: PPO | Admitting: Family Medicine

## 2018-06-08 ENCOUNTER — Encounter: Payer: Self-pay | Admitting: Family Medicine

## 2018-06-08 ENCOUNTER — Ambulatory Visit: Payer: PPO | Admitting: Family Medicine

## 2018-06-08 VITALS — BP 118/64 | HR 74 | Temp 98.1°F | Ht 68.0 in | Wt 175.6 lb

## 2018-06-08 DIAGNOSIS — I10 Essential (primary) hypertension: Secondary | ICD-10-CM | POA: Diagnosis not present

## 2018-06-08 DIAGNOSIS — E1169 Type 2 diabetes mellitus with other specified complication: Secondary | ICD-10-CM | POA: Diagnosis not present

## 2018-06-08 DIAGNOSIS — E785 Hyperlipidemia, unspecified: Secondary | ICD-10-CM

## 2018-06-08 DIAGNOSIS — Z794 Long term (current) use of insulin: Secondary | ICD-10-CM | POA: Diagnosis not present

## 2018-06-08 DIAGNOSIS — E118 Type 2 diabetes mellitus with unspecified complications: Secondary | ICD-10-CM

## 2018-06-08 DIAGNOSIS — IMO0001 Reserved for inherently not codable concepts without codable children: Secondary | ICD-10-CM

## 2018-06-08 DIAGNOSIS — E1159 Type 2 diabetes mellitus with other circulatory complications: Secondary | ICD-10-CM

## 2018-06-08 DIAGNOSIS — I152 Hypertension secondary to endocrine disorders: Secondary | ICD-10-CM

## 2018-06-08 LAB — POCT GLYCOSYLATED HEMOGLOBIN (HGB A1C): Hemoglobin A1C: 7.3 % — AB (ref 4.0–5.6)

## 2018-06-08 MED ORDER — SEMAGLUTIDE(0.25 OR 0.5MG/DOS) 2 MG/1.5ML ~~LOC~~ SOPN: 0.5000 mg | PEN_INJECTOR | SUBCUTANEOUS | 2 refills | Status: DC

## 2018-06-08 MED ORDER — OMEPRAZOLE 40 MG PO CPDR: 40.0000 mg | DELAYED_RELEASE_CAPSULE | Freq: Two times a day (BID) | ORAL | 3 refills | Status: DC

## 2018-06-08 NOTE — Assessment & Plan Note (Signed)
A1c increased to 7.3 today.  We will start Ozempic 0.25 mg weekly for the next 4 weeks, then increase to 0.5 mg weekly.  Continue metformin 1000 mg twice daily.  Instructed patient to decrease his dose of Lantus to 15 units twice daily.  He will continue to down titrate at home by 1 unit every day that his fasting blood sugar is less than 150.  Follow-up with me in 3 months for repeat A1c.

## 2018-06-08 NOTE — Progress Notes (Signed)
   Subjective:  Margie Brink is a 62 y.o. male who presents today with a chief complaint of T2DM.   HPI:  T2DM Last seen for this about 3 months ago.  At that time he was on metformin 1000 mg twice daily and Lantus 15 to 20 units twice daily.  His A1c was 7.64-monthago.  He has done well with the above regimen.  He has had a few high readings into the 300s, but is otherwise been well controlled.  He has tolerating all his medications well without side effects.  No reported polyuria polydipsia.  HLD Tolerating Lipitor 80 mg daily without side effects.  Hypertension Currently on spironolactone 25 mg daily, losartan 25 mg daily, and Coreg 3.125 mg twice daily.  Tolerating these well without side effects.  No reported chest pain or shortness of breath.  ROS: Per HPI  PMH: He reports that he has never smoked. He has never used smokeless tobacco. He reports that he drinks about 1.0 standard drinks of alcohol per week. He reports that he does not use drugs.  Objective:  Physical Exam: BP 118/64 (BP Location: Left Arm, Patient Position: Sitting, Cuff Size: Normal)   Pulse 74   Temp 98.1 F (36.7 C) (Oral)   Ht '5\' 8"'$  (1.727 m)   Wt 175 lb 9.6 oz (79.7 kg)   SpO2 99%   BMI 26.70 kg/m   Gen: NAD, resting comfortably CV: RRR with no murmurs appreciated Pulm: NWOB, CTAB with no crackles, wheezes, or rhonchi  Results for orders placed or performed in visit on 06/08/18 (from the past 24 hour(s))  POCT glycosylated hemoglobin (Hb A1C)     Status: Abnormal   Collection Time: 06/08/18 10:13 AM  Result Value Ref Range   Hemoglobin A1C 7.3 (A) 4.0 - 5.6 %   HbA1c POC (<> result, manual entry)     HbA1c, POC (prediabetic range)     HbA1c, POC (controlled diabetic range)       Assessment/Plan:  Insulin dependent diabetes mellitus with complications ([name]) AK1Mincreased to 7.3 today.  We will start Ozempic 0.25 mg weekly for the next 4 weeks, then increase to 0.5 mg weekly.  Continue  metformin 1000 mg twice daily.  Instructed patient to decrease his dose of Lantus to 15 units twice daily.  He will continue to down titrate at home by 1 unit every day that his fasting blood sugar is less than 150.  Follow-up with me in 3 months for repeat A1c.  Hypertension associated with diabetes ([place].  Continue Coreg 3.125 mg twice daily, losartan 25 mg daily, and spironolactone 25 mg daily.  Recent be met within normal limits.  Hyperlipidemia associated with type 2 diabetes mellitus ([name]) LDL 53 on lipid panel 2 weeks ago.  Continue atorvastatin 80 mg daily.  CAlgis Greenhouse PJerline Pain MD 06/08/2018 12:16 PM

## 2018-06-08 NOTE — Assessment & Plan Note (Signed)
LDL 53 on lipid panel 2 weeks ago.  Continue atorvastatin 80 mg daily.

## 2018-06-08 NOTE — Patient Instructions (Signed)
It was very nice to see you today!  Please start the ozempic.  Please take 0.25mg  once per week for the next 4 weeks. Then take 0.5mg  once per week until you come back to see me.  Decrease your lantus to 15 units twice daily. Please decrease by 1 unit every time that your blood sugar is less than 150.  No other changes today.  Come back to see me in 3 months, or sooner as needed.   Take care, Dr [name]

## 2018-06-08 NOTE — Assessment & Plan Note (Signed)
At goal.  Continue Coreg 3.125 mg twice daily, losartan 25 mg daily, and spironolactone 25 mg daily.  Recent be met within normal limits.

## 2018-06-08 NOTE — Telephone Encounter (Signed)
See note

## 2018-06-08 NOTE — Telephone Encounter (Signed)
Copied from [place] [id number]. Topic: Quick Communication - See Telephone Encounter >> Jun 08, 2018  2:03 PM Conception Chancy, NT wrote: CRM for notification. See Telephone encounter for: 06/08/18.  Patient is calling and states he watched a commercial on tv today and wants to speak to Dr. [name] about Semaglutide,0.25 or 0.5MG /DOS, (OZEMPIC, 0.25 OR 0.5 MG/DOSE,) 2 MG/1.5ML SOPN before starting that medication. Please contact. [phone]

## 2018-06-09 NOTE — Telephone Encounter (Signed)
Patient states he saw a commercial on TV that stated that you should not take Ozempic if you have diabetic retinopathy.  Patient states he has this and would like to know if he is safe to take this medication.  Please advise.

## 2018-06-09 NOTE — Telephone Encounter (Signed)
Called and left information on voicemail, but please relay information again if patient calls with questions.  CRM placed.

## 2018-06-09 NOTE — Telephone Encounter (Signed)
Should be safe for him to take ozempic. Only very small increased risk vs placebo. Recommend yearly monitoring while on ozempic, which we should be doing anyway.  Alternatively, could send in trulicity 0.75mg  weekly. This is a similar medication that works the same way as the ozempic. Still should have yearly monitoring, though.  [name]. [name], MD 06/09/2018 12:01 PM

## 2018-06-15 DIAGNOSIS — H43811 Vitreous degeneration, right eye: Secondary | ICD-10-CM | POA: Diagnosis not present

## 2018-06-15 DIAGNOSIS — E113513 Type 2 diabetes mellitus with proliferative diabetic retinopathy with macular edema, bilateral: Secondary | ICD-10-CM | POA: Diagnosis not present

## 2018-06-17 ENCOUNTER — Encounter: Payer: Self-pay | Admitting: Physical Therapy

## 2018-06-29 DIAGNOSIS — E113593 Type 2 diabetes mellitus with proliferative diabetic retinopathy without macular edema, bilateral: Secondary | ICD-10-CM | POA: Diagnosis not present

## 2018-06-29 DIAGNOSIS — H3582 Retinal ischemia: Secondary | ICD-10-CM | POA: Diagnosis not present

## 2018-06-29 DIAGNOSIS — H43822 Vitreomacular adhesion, left eye: Secondary | ICD-10-CM | POA: Diagnosis not present

## 2018-06-29 DIAGNOSIS — H4311 Vitreous hemorrhage, right eye: Secondary | ICD-10-CM | POA: Diagnosis not present

## 2018-09-08 ENCOUNTER — Encounter: Payer: Self-pay | Admitting: Family Medicine

## 2018-09-08 ENCOUNTER — Ambulatory Visit (INDEPENDENT_AMBULATORY_CARE_PROVIDER_SITE_OTHER): Payer: PPO | Admitting: Family Medicine

## 2018-09-08 VITALS — BP 112/62 | HR 68 | Temp 97.6°F | Ht 68.0 in | Wt 170.0 lb

## 2018-09-08 DIAGNOSIS — E1169 Type 2 diabetes mellitus with other specified complication: Secondary | ICD-10-CM | POA: Diagnosis not present

## 2018-09-08 DIAGNOSIS — Z794 Long term (current) use of insulin: Secondary | ICD-10-CM

## 2018-09-08 DIAGNOSIS — I10 Essential (primary) hypertension: Secondary | ICD-10-CM

## 2018-09-08 DIAGNOSIS — K219 Gastro-esophageal reflux disease without esophagitis: Secondary | ICD-10-CM

## 2018-09-08 DIAGNOSIS — E1159 Type 2 diabetes mellitus with other circulatory complications: Secondary | ICD-10-CM

## 2018-09-08 DIAGNOSIS — E785 Hyperlipidemia, unspecified: Secondary | ICD-10-CM

## 2018-09-08 DIAGNOSIS — E1142 Type 2 diabetes mellitus with diabetic polyneuropathy: Secondary | ICD-10-CM | POA: Diagnosis not present

## 2018-09-08 DIAGNOSIS — E118 Type 2 diabetes mellitus with unspecified complications: Secondary | ICD-10-CM | POA: Diagnosis not present

## 2018-09-08 DIAGNOSIS — E11319 Type 2 diabetes mellitus with unspecified diabetic retinopathy without macular edema: Secondary | ICD-10-CM | POA: Diagnosis not present

## 2018-09-08 DIAGNOSIS — I152 Hypertension secondary to endocrine disorders: Secondary | ICD-10-CM

## 2018-09-08 DIAGNOSIS — IMO0001 Reserved for inherently not codable concepts without codable children: Secondary | ICD-10-CM

## 2018-09-08 LAB — POCT GLYCOSYLATED HEMOGLOBIN (HGB A1C): Hemoglobin A1C: 7 % — AB (ref 4.0–5.6)

## 2018-09-08 NOTE — Patient Instructions (Signed)
It was very nice to see you today!  Keep up good work!  No medication changes today.  Come back to see me in 3 months, or sooner as needed.  Take care, Dr [name]

## 2018-09-08 NOTE — Progress Notes (Signed)
poct

## 2018-09-08 NOTE — Assessment & Plan Note (Signed)
At goal.  Continue spironolactone 25 mg daily, losartan 25 mg daily, Coreg 3.125 mg daily.

## 2018-09-08 NOTE — Assessment & Plan Note (Signed)
Stable. Continue omeprazole 40 mg twice daily  

## 2018-09-08 NOTE — Progress Notes (Signed)
   Chief Complaint:  Jeffrey [name] is a 63 y.o. male who presents today with a chief complaint of T2DM follow up.   Assessment/Plan:  Insulin dependent diabetes mellitus with complications ([name]) C1U improved to 7.0.  He has lost about 8 pounds over the last 3 months.  Congratulated patient on this.  Continue metformin 1000 mg twice daily.  Continue Lantus 20 units once daily.  Follow-up with me in 3 months for repeat A1c.  Will need repeat foot exam.  Consider trial of Trulicity in the future.  Hypertension associated with diabetes ([place]) At goal.  Continue spironolactone 25 mg daily, losartan 25 mg daily, Coreg 3.125 mg daily.  Hyperlipidemia associated with type 2 diabetes mellitus ([name]) Last LDL 53.  Continue Lipitor 80 mg daily.  GERD (gastroesophageal reflux disease) Stable.  Continue omeprazole 40 mg twice daily.  Diabetic retinopathy ([place]) Continue management per ophthalmology.     Subjective:  HPI:  # T2DM  -Currently on  Lantus 20 units in the morning and metformin 1000 mg twice daily. -Home sugar readings in 160s.  -ROS: No reported polyuria or polydipsia  # Dyslipidemia / CAD s/p stenting in 2015 -Currently on Lipitor 80 mg daily and tolerating without side effects. -ROS: No myalgias  # Essential Hypertension -Currently on spironolactone 25 mg daily, losartan 25 mg daily, and Coreg 3.125 mg twice daily.  Tolerating all these well without side effects. - ROS: No chest pain or shortness of breath.  # GERD - Currently on omeprazole 40mg  twice daily  % Diabetic Retinopathy -Follows with ophthalmology every 3 months - Dr Baird Cancer  ROS: Per HPI  PMH: He reports that he has never smoked. He has never used smokeless tobacco. He reports current alcohol use of about 1.0 standard drinks of alcohol per week. He reports that he does not use drugs.      Objective:  Physical Exam: BP 112/62 (BP Location: Left Arm, Patient Position: Sitting, Cuff Size: Normal)   Pulse  68   Temp 97.6 F (36.4 C) (Oral)   Ht 5\' 8"  (1.727 m)   Wt 170 lb (77.1 kg)   SpO2 99%   BMI 25.85 kg/m   Wt Readings from Last 3 Encounters:  09/08/18 170 lb (77.1 kg)  06/08/18 175 lb 9.6 oz (79.7 kg)  06/03/18 178 lb (80.7 kg)  Gen: NAD, resting comfortably CV: Regular rate and rhythm with no murmurs appreciated Pulm: Normal work of breathing, clear to auscultation bilaterally with no crackles, wheezes, or rhonchi     Caleb M. [name], MD 09/08/2018 10:40 AM

## 2018-09-08 NOTE — Assessment & Plan Note (Signed)
Last LDL 53.  Continue Lipitor 80 mg daily. °

## 2018-09-08 NOTE — Assessment & Plan Note (Signed)
A1c improved to 7.0.  He has lost about 8 pounds over the last 3 months.  Congratulated patient on this.  Continue metformin 1000 mg twice daily.  Continue Lantus 20 units once daily.  Follow-up with me in 3 months for repeat A1c.  Will need repeat foot exam.  Consider trial of Trulicity in the future.

## 2018-09-08 NOTE — Assessment & Plan Note (Signed)
Continue management per ophthalmology. 

## 2018-09-28 DIAGNOSIS — H3582 Retinal ischemia: Secondary | ICD-10-CM | POA: Diagnosis not present

## 2018-09-28 DIAGNOSIS — E113593 Type 2 diabetes mellitus with proliferative diabetic retinopathy without macular edema, bilateral: Secondary | ICD-10-CM | POA: Diagnosis not present

## 2018-09-28 DIAGNOSIS — H4311 Vitreous hemorrhage, right eye: Secondary | ICD-10-CM | POA: Diagnosis not present

## 2018-10-14 ENCOUNTER — Other Ambulatory Visit: Payer: Self-pay | Admitting: Cardiovascular Disease

## 2018-10-14 ENCOUNTER — Other Ambulatory Visit: Payer: Self-pay | Admitting: Family Medicine

## 2018-10-19 DIAGNOSIS — E113591 Type 2 diabetes mellitus with proliferative diabetic retinopathy without macular edema, right eye: Secondary | ICD-10-CM | POA: Diagnosis not present

## 2018-10-20 ENCOUNTER — Other Ambulatory Visit: Payer: Self-pay

## 2018-10-20 MED ORDER — SPIRONOLACTONE 25 MG PO TABS: 25.0000 mg | ORAL_TABLET | Freq: Every day | ORAL | 1 refills | Status: DC

## 2018-11-13 ENCOUNTER — Other Ambulatory Visit: Payer: Self-pay | Admitting: Family Medicine

## 2018-11-17 ENCOUNTER — Other Ambulatory Visit: Payer: Self-pay | Admitting: Family Medicine

## 2018-12-01 ENCOUNTER — Telehealth: Payer: Self-pay

## 2018-12-01 NOTE — Telephone Encounter (Signed)
Please advise.  Do you want patient to have virtual visit and come in for labs?

## 2018-12-01 NOTE — Telephone Encounter (Signed)
Ok to schedule patient for doxy appointment.  He can do drive up J1T prior to visit.  Thanks!

## 2018-12-01 NOTE — Telephone Encounter (Signed)
Yes ok with virtual visit. He can drive up to get A4Q and we can do a doxy appointment to discuss results.  [name]. [name], MD 12/01/2018 8:15 AM

## 2018-12-01 NOTE — Telephone Encounter (Signed)
Copied from [place] [id number]. Topic: Appointment Scheduling - Scheduling Inquiry for Clinic >> Nov 30, 2018 12:22 PM Micheline Chapman wrote: Reason for CRM: Called pt to change appt to a virtual doxy appt. No answer, LVM. >> Nov 30, 2018 12:29 PM Alanda Slim E wrote: Pt returned call and stated the appt was to check his A1C/ Stoney Bang was not available/ please advise

## 2018-12-06 ENCOUNTER — Other Ambulatory Visit: Payer: Self-pay | Admitting: Family Medicine

## 2018-12-06 DIAGNOSIS — IMO0001 Reserved for inherently not codable concepts without codable children: Secondary | ICD-10-CM

## 2018-12-06 DIAGNOSIS — E118 Type 2 diabetes mellitus with unspecified complications: Principal | ICD-10-CM

## 2018-12-06 DIAGNOSIS — Z794 Long term (current) use of insulin: Principal | ICD-10-CM

## 2018-12-07 ENCOUNTER — Other Ambulatory Visit (INDEPENDENT_AMBULATORY_CARE_PROVIDER_SITE_OTHER): Payer: PPO

## 2018-12-07 DIAGNOSIS — IMO0001 Reserved for inherently not codable concepts without codable children: Secondary | ICD-10-CM

## 2018-12-07 DIAGNOSIS — Z794 Long term (current) use of insulin: Secondary | ICD-10-CM

## 2018-12-07 DIAGNOSIS — E118 Type 2 diabetes mellitus with unspecified complications: Secondary | ICD-10-CM | POA: Diagnosis not present

## 2018-12-07 LAB — POCT GLYCOSYLATED HEMOGLOBIN (HGB A1C): Hemoglobin A1C: 7.4 % — AB (ref 4.0–5.6)

## 2018-12-08 ENCOUNTER — Encounter: Payer: Self-pay | Admitting: Family Medicine

## 2018-12-08 ENCOUNTER — Ambulatory Visit (INDEPENDENT_AMBULATORY_CARE_PROVIDER_SITE_OTHER): Payer: PPO | Admitting: Family Medicine

## 2018-12-08 VITALS — Wt 155.0 lb

## 2018-12-08 DIAGNOSIS — E1159 Type 2 diabetes mellitus with other circulatory complications: Secondary | ICD-10-CM | POA: Diagnosis not present

## 2018-12-08 DIAGNOSIS — E785 Hyperlipidemia, unspecified: Secondary | ICD-10-CM | POA: Diagnosis not present

## 2018-12-08 DIAGNOSIS — E1169 Type 2 diabetes mellitus with other specified complication: Secondary | ICD-10-CM | POA: Diagnosis not present

## 2018-12-08 DIAGNOSIS — K219 Gastro-esophageal reflux disease without esophagitis: Secondary | ICD-10-CM | POA: Diagnosis not present

## 2018-12-08 DIAGNOSIS — E11319 Type 2 diabetes mellitus with unspecified diabetic retinopathy without macular edema: Secondary | ICD-10-CM | POA: Diagnosis not present

## 2018-12-08 DIAGNOSIS — I10 Essential (primary) hypertension: Secondary | ICD-10-CM | POA: Diagnosis not present

## 2018-12-08 DIAGNOSIS — E118 Type 2 diabetes mellitus with unspecified complications: Secondary | ICD-10-CM | POA: Diagnosis not present

## 2018-12-08 DIAGNOSIS — Z794 Long term (current) use of insulin: Secondary | ICD-10-CM

## 2018-12-08 DIAGNOSIS — I152 Hypertension secondary to endocrine disorders: Secondary | ICD-10-CM

## 2018-12-08 DIAGNOSIS — IMO0001 Reserved for inherently not codable concepts without codable children: Secondary | ICD-10-CM

## 2018-12-08 NOTE — Assessment & Plan Note (Signed)
Symptoms have worsened since the laser procedure several weeks ago.  Will following up with ophthalmology in a few weeks.

## 2018-12-08 NOTE — Assessment & Plan Note (Signed)
Stable. Continue omeprazole 40 mg twice daily  

## 2018-12-08 NOTE — Progress Notes (Signed)
    Chief Complaint:  Rito Lecomte is a 63 y.o. male who presents today for a virtual office visit with a chief complaint of T2DM follow up.   Assessment/Plan:  Insulin dependent diabetes mellitus with complications ([name]) H4H stable at 7.4.  He has continued to lose weight and is down about 15 pounds over the last 3 months.  Congratulated patient on this.  Continue metformin 1000 mg twice daily and Lantus 20 units once daily.  Discussed potential addition of alternative agents at this point however he declined.  Would consider addition of Invokana or other SGLT2 or possibly addition of GLP-1 agonist given his cardiac history in the future.  Would consider trial of Trulicity given history of diabetic retinopathy.  Hypertension associated with diabetes ([place]) Stable.  Continue spironolactone 25 mg daily, losartan 25 mg daily and Coreg 3.125 mg daily.  Hyperlipidemia associated with type 2 diabetes mellitus ([name]) Stable.  Continue Lipitor 80 mg daily.  GERD (gastroesophageal reflux disease) Stable.  Continue omeprazole 40 mg twice daily.  Diabetic retinopathy ([place]) Symptoms have worsened since the laser procedure several weeks ago.  Will following up with ophthalmology in a few weeks.     Subjective:  HPI:  His stable, chronic medical conditions are outlined below:  # T2DM  -Currently on  Lantus 20 units in the morning and metformin 1000 mg twice daily. -Home sugar readings in 100s -ROS: No reported polyuria or polydipsia  # Dyslipidemia / CAD s/p stenting in 2015 -Currently on Lipitor 80 mg daily and tolerating without side effects. -ROS: No myalgias  # Essential Hypertension -Currently on spironolactone 25 mg daily, losartan 25 mg daily, and Coreg 3.125 mg twice daily.  Tolerating all these well without side effects. - ROS: No chest pain or shortness of breath.  # GERD - Currently on omeprazole 40mg  twice daily  % Diabetic Retinopathy -Follows with ophthalmology every 3  months - Dr Baird Cancer -Recently had laser procedure and has had some persistent blurred vision.    ROS: Per HPI  PMH: He reports that he has never smoked. He has never used smokeless tobacco. He reports current alcohol use of about 1.0 standard drinks of alcohol per week. He reports that he does not use drugs.      Objective/Observations  Physical Exam: Wt Readings from Last 3 Encounters:  12/08/18 155 lb (70.3 kg)  09/08/18 170 lb (77.1 kg)  06/08/18 175 lb 9.6 oz (79.7 kg)  Gen: NAD, resting comfortably Pulm: Normal work of breathing Neuro: Grossly normal, moves all extremities Psych: Normal affect and thought content  No results found for this or any previous visit (from the past 24 hour(s)).   Virtual Visit via Video   I connected with [name] on 12/08/18 at  9:40 AM EDT by a video enabled telemedicine application and verified that I am speaking with the correct person using two identifiers. I discussed the limitations of evaluation and management by telemedicine and the availability of in person appointments. The patient expressed understanding and agreed to proceed.   Patient location: Home Provider location: [place] participating in the virtual visit: Myself and Patient  Time Spent: I spent >40 minutes face-to-face with the patient, with more than half spent on counseling for management plan for his diabetes, hyperlipidemia, hypertension, retinopathy, and GERD.      [name]. [name], MD 12/08/2018 10:27 AM

## 2018-12-08 NOTE — Assessment & Plan Note (Signed)
A1c stable at 7.4.  He has continued to lose weight and is down about 15 pounds over the last 3 months.  Congratulated patient on this.  Continue metformin 1000 mg twice daily and Lantus 20 units once daily.  Discussed potential addition of alternative agents at this point however he declined.  Would consider addition of Invokana or other SGLT2 or possibly addition of GLP-1 agonist given his cardiac history in the future.  Would consider trial of Trulicity given history of diabetic retinopathy.

## 2018-12-08 NOTE — Assessment & Plan Note (Signed)
Stable.  Continue Lipitor 80mg daily

## 2018-12-08 NOTE — Assessment & Plan Note (Signed)
Stable.  Continue spironolactone 25 mg daily, losartan 25 mg daily and Coreg 3.125 mg daily.

## 2018-12-09 ENCOUNTER — Telehealth: Payer: Self-pay | Admitting: Family Medicine

## 2018-12-09 ENCOUNTER — Other Ambulatory Visit: Payer: Self-pay

## 2018-12-09 DIAGNOSIS — R634 Abnormal weight loss: Secondary | ICD-10-CM

## 2018-12-09 NOTE — Telephone Encounter (Signed)
Labs have been placed except for A1c, which was just recently performed.

## 2018-12-09 NOTE — Telephone Encounter (Signed)
Patient requesting to have labs drawn.  Please advise.

## 2018-12-09 NOTE — Telephone Encounter (Signed)
Pt called this morning stating that he has gone from 190lbs to 155lbs and believes he may have cancer. Pt would like to come into the office for appt and bloodwork. Phone number [phone].

## 2018-12-09 NOTE — Telephone Encounter (Signed)
Ok to place labs for weight loss: CBC with differential, TSH, CMET, and A1c.   [name]. [name], MD 12/09/2018 10:22 AM

## 2018-12-10 ENCOUNTER — Other Ambulatory Visit: Payer: PPO

## 2018-12-24 ENCOUNTER — Telehealth: Payer: Self-pay | Admitting: Cardiovascular Disease

## 2018-12-24 NOTE — Telephone Encounter (Signed)
LMSG WITH PATIENT TO CALL BACK.  HE HAS APPT ON 5/26 WITH DR [place]. PLEASE GET PERMISSION TO CHANGE TO VIRTUAL APPT.  IF PATIENT IS AGREEABLE, PLEASE CHANGE APPT TYPE.  THANKS!

## 2018-12-27 NOTE — Telephone Encounter (Signed)
Follow Up    Pt is calling back and says he doesn't think a telephone or virtual visit will work for him. He says he had to have laser surgery due to cutting back on his BP medication. He also says he needs to have an EKG    Please call

## 2018-12-29 NOTE — Telephone Encounter (Signed)
Left message to call back to discuss.

## 2018-12-29 NOTE — Telephone Encounter (Signed)
Spoke with patient regarding appointment next week and changed to virtual visit   Patient advised he will receive the best possible care via telephone/video visit and insurance will be billed. Patient gave verbal consent for virtual visit next week

## 2018-12-30 ENCOUNTER — Telehealth: Payer: Self-pay | Admitting: Cardiovascular Disease

## 2018-12-31 NOTE — Telephone Encounter (Signed)
home phone/ brother will assist with virtual/ consent/ my chart declined/ pre reg completed

## 2019-01-04 ENCOUNTER — Encounter: Payer: Self-pay | Admitting: Cardiovascular Disease

## 2019-01-04 ENCOUNTER — Telehealth (INDEPENDENT_AMBULATORY_CARE_PROVIDER_SITE_OTHER): Payer: PPO | Admitting: Cardiovascular Disease

## 2019-01-04 VITALS — BP 115/67 | HR 73 | Ht 68.0 in | Wt 156.4 lb

## 2019-01-04 DIAGNOSIS — Z794 Long term (current) use of insulin: Secondary | ICD-10-CM | POA: Diagnosis not present

## 2019-01-04 DIAGNOSIS — I10 Essential (primary) hypertension: Secondary | ICD-10-CM

## 2019-01-04 DIAGNOSIS — I152 Hypertension secondary to endocrine disorders: Secondary | ICD-10-CM

## 2019-01-04 DIAGNOSIS — Z9861 Coronary angioplasty status: Secondary | ICD-10-CM

## 2019-01-04 DIAGNOSIS — E1159 Type 2 diabetes mellitus with other circulatory complications: Secondary | ICD-10-CM | POA: Diagnosis not present

## 2019-01-04 DIAGNOSIS — E118 Type 2 diabetes mellitus with unspecified complications: Secondary | ICD-10-CM | POA: Diagnosis not present

## 2019-01-04 DIAGNOSIS — I2510 Atherosclerotic heart disease of native coronary artery without angina pectoris: Secondary | ICD-10-CM

## 2019-01-04 DIAGNOSIS — Z5181 Encounter for therapeutic drug level monitoring: Secondary | ICD-10-CM

## 2019-01-04 DIAGNOSIS — IMO0001 Reserved for inherently not codable concepts without codable children: Secondary | ICD-10-CM

## 2019-01-04 NOTE — Progress Notes (Signed)
Virtual Visit via Video Note   This visit type was conducted due to national recommendations for restrictions regarding the COVID-19 Pandemic (e.g. social distancing) in an effort to limit this patient's exposure and mitigate transmission in our community.  Due to his co-morbid illnesses, this patient is at least at moderate risk for complications without adequate follow up.  This format is felt to be most appropriate for this patient at this time.  All issues noted in this document were discussed and addressed.  A limited physical exam was performed with this format.  Please refer to the patient's chart for his consent to telehealth for [facility].   Date:  01/04/2019   ID:  Jeffrey [name], DOB 1955/10/13, MRN [id number]  Patient Location: Home Provider Location: Home  PCP:  Vivi Barrack, MD  Cardiologist:  Skeet Latch, MD  Electrophysiologist:  None   Evaluation Performed:  Follow-Up Visit  Chief Complaint:  CAD  History of Present Illness:    Jeffrey [name] is a 63 y.o. male with CAD s/p MI, hypertension, hyperlipidemia, diabetes and peripheral neuropathy who presents for follow up.  Jeffrey [name] recently moved to [facility] from [place].  He moved here with his identical twin brother.  Jeffrey [name] had a cardiac arrest 11/23/13 and underwent PCI of the LAD.  Echo 10/2015 revealed an anteroseptal and apical septal infarct with associated hypokinesis.  LVEF was 55%.  He had a PET CT/myocardial perfusion scan 10/2015 that revealed a moderate sized, moderate intensity antero-apical infarct with mild ischemia.  At the end of 2016 he had an episode of stabbing chest pain.  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  At that time his clopidogrel was discontinued and he underwent several laser procedures.  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    Jeffrey [name]  reported atypical chest pain.  He was referred for [facility] 09/2017 that revealed LVEF 48% and a prior infarct in the apical inferior, apical lateral, and apical regions.  There was no ischemia.  He has struggled with syncope and his antihypertensives were reduced at prior appointments.    Since his last appointment he has been doing well.  He has not experienced any chest pain or shortness of breath.  He has been staying into the house due to COVID-19.  He had enrolled in Silver sneakers but this was put on hold due to the virus.  He has been able to lose weight by working on his diet and starting metformin.  He has been rationing his insulin because he does not want to be in the donut hole.  He has no lower extremity edema, orthopnea, or PND.  The patient does not have symptoms concerning for COVID-19 infection (fever, chills, cough, or new shortness of breath).    Past Medical History:  Diagnosis Date  . Cardiac arrest ([place]) 05/22/2016  . CHF (congestive heart failure) ([place])   . Diabetes mellitus without complication ([place])   . Diabetic retinopathy ([place])   . Hyperlipidemia   . Hypertension   . Myocardial infarct ([place]) 2105  . S/P primary angioplasty with coronary stent 05/22/2016   No past surgical history on file.   Current Meds  Medication Sig  . aspirin EC 81 MG tablet Take 81 mg by mouth daily.  [name] Kitchen atorvastatin (LIPITOR) 80 MG tablet TAKE 1 TABLET BY  MOUTH EVERY DAY  . Blood Glucose Monitoring Suppl (ONETOUCH VERIO IQ SYSTEM) w/Device KIT 1 kit by Does not apply route 4 (four) times daily.  . carvedilol (COREG) 3.125 MG tablet TAKE 1 TABLET (3.125 MG TOTAL) BY MOUTH 2 (TWO) TIMES DAILY WITH A MEAL.  . cholecalciferol (VITAMIN D) 1000 units tablet Take 2,000 Units by mouth daily.  . furosemide (LASIX) 40 MG tablet TAKE 1 TABLET BY MOUTH EVERY DAY  . glucose blood test strip Check blood sugar 4 times per day  . insulin glargine (LANTUS) 100 UNIT/ML injection Inject 15-28 units  twice daily.  . Insulin Syringe-Needle U-100 (B-D INS SYR HALF-UNIT .3CC/31G) 31G X 5/16" 0.3 ML MISC Use 6 times a day  . ipratropium (ATROVENT) 0.06 % nasal spray Place 2 sprays into both nostrils 4 (four) times daily.  . Lancets (ONETOUCH ULTRASOFT) lancets Check blood sugar 4 times per day  . losartan (COZAAR) 25 MG tablet TAKE 1 TABLET BY MOUTH EVERY DAY  . metFORMIN (GLUCOPHAGE-XR) 500 MG 24 hr tablet TAKE 2 TABLETS BY MOUTH TWICE A DAY  . metoCLOPramide (REGLAN) 10 MG tablet Take 1 tablet (10 mg total) by mouth 4 (four) times daily.  [name] Kitchen omeprazole (PRILOSEC) 40 MG capsule Take 1 capsule (40 mg total) by mouth 2 (two) times daily.  [name] Kitchen spironolactone (ALDACTONE) 25 MG tablet Take 1 tablet (25 mg total) by mouth daily.     Allergies:   Codeine and Penicillins   Social History   Tobacco Use  . Smoking status: Never Smoker  . Smokeless tobacco: Never Used  Substance Use Topics  . Alcohol use: Yes    Alcohol/week: 1.0 standard drinks    Types: 1 Cans of beer per week    Comment: occ  . Drug use: No     Family Hx: The patient's family history includes Alzheimer's disease in his mother; Heart disease in his brother and father; Peripheral Artery Disease in his father.  ROS:   Please see the history of present illness.     All other systems reviewed and are negative.   Prior CV studies:   The following studies were reviewed today:  Echo 11/01/15: LVEF >55%.  Mild concentric LVH. Mild anteroseptal and apical septal hypokinesis. Normal RV function.  Lexiscan Myoview 09/2017:  The left ventricular ejection fraction is mildly decreased (45-54%).  Nuclear stress EF: 48%.  There was no ST segment deviation noted during stress.  Defect 1: There is a small defect of severe severity present in the apical inferior, apical lateral and apex location.  This is a low risk study.  Abnormal, low risk stress nuclear study with prior apical infarct; no ischemia; EF 48 with mild global  hypokinesis; mild LVE.  Labs/Other Tests and Data Reviewed:    EKG:  No ECG reviewed.  Recent Labs: 05/24/2018: ALT 18; BUN 39; Creatinine, Ser 1.49; Hemoglobin 13.0; Platelets 385.0; Potassium 4.8; Sodium 137   Recent Lipid Panel Lab Results  Component Value Date/Time   CHOL 117 05/24/2018 09:23 AM   TRIG 174.0 (H) 05/24/2018 09:23 AM   HDL 29.40 (L) 05/24/2018 09:23 AM   CHOLHDL 4 05/24/2018 09:23 AM   LDLCALC 53 05/24/2018 09:23 AM    09/07/15: Sodium 138, potassium 4.5, BUN 20, creatinine 1.02 AST 17, ALT 24 Total cholesterol 113, triglycerides 146, HDL 32, LDL 52 TSH 1.76 WBC 11.7, hemoglobin 13.4, hematocrit 40.2, platelets 317 Hemoglobin A1c 8.9 on a relatively dry%   Wt Readings from Last 3 Encounters:  01/04/19  156 lb 6.4 oz (70.9 kg)  12/08/18 155 lb (70.3 kg)  09/08/18 170 lb (77.1 kg)     Objective:    BP 115/67   Pulse 73   Ht '5\' 8"'  (1.727 m)   Wt 156 lb 6.4 oz (70.9 kg)   BMI 23.78 kg/m  GENERAL: Well-appearing.  No acute distress. HEENT: Pupils equal round.  Oral mucosa unremarkable NECK:  No jugular venous distention, no visible thyromegaly EXT:  No edema, no cyanosis no clubbing SKIN:  No rashes no nodules NEURO:  Speech fluent.  Cranial nerves grossly intact.  Moves all 4 extremities freely PSYCH:  Cognitively intact, oriented to person place and time   ASSESSMENT & PLAN:    # Syncope: # Hypertension:    BP is stable and his dizziness has improved.  He will continue carvedilol, losartan, furosemide and spironolactone.    # CAD s/p cardiac arrest and LAD PCI: Chest pain resolved.  Lexiscan Myoview was negative for ischemia 09/2017.  Continue aspirin, atorvastatin, and carvedilol.     # Hyperlipidemia.  LDL 53 05/2018. Continue atorvastatin.  He will come for fasting lipids/CMP.  # DM:  Most recent hemoglobin A1c was 7.4%.  He would be a good candidate for Jardiance.  Will forward him to the [place] study team.   COVID-19 Education:  The signs and symptoms of COVID-19 were discussed with the patient and how to seek care for testing (follow up with PCP or arrange E-visit).  The importance of social distancing was discussed today.  Time:   Today, I have spent 21 minutes with the patient with telehealth technology discussing the above problems.     Medication Adjustments/Labs and Tests Ordered: Current medicines are reviewed at length with the patient today.  Concerns regarding medicines are outlined above.   Tests Ordered: No orders of the defined types were placed in this encounter.   Medication Changes: No orders of the defined types were placed in this encounter.   Disposition:  Follow up in 6 month(s)  Signed, Skeet Latch, MD  01/04/2019 9:37 AM    [facility]

## 2019-01-04 NOTE — Patient Instructions (Addendum)
Medication Instructions:  Your physician recommends that you continue on your current medications as directed. Please refer to the Current Medication list given to you today.  If you need a refill on your cardiac medications before your next appointment, please call your pharmacy.   Lab work: FASTING LP/CMET SOON   If you have labs (blood work) drawn today and your tests are completely normal, you will receive your results only by: [name] Kitchen MyChart Message (if you have MyChart) OR . A paper copy in the mail If you have any lab test that is abnormal or we need to change your treatment, we will call you to review the results.  Testing/Procedures: NONE  Follow-Up: At [facility], you and your health needs are our priority.  As part of our continuing mission to provide you with exceptional heart care, we have created designated Provider Care Teams.  These Care Teams include your primary Cardiologist (physician) and Advanced Practice Providers (APPs -  Physician Assistants and Nurse Practitioners) who all work together to provide you with the care you need, when you need it. You will need a follow up appointment in 6 months.  Please call our office 2 months in advance to schedule this appointment.  You may see Skeet Latch, MD  or one of the following Advanced Practice Providers on your designated Care Team:   Kerin Ransom, PA-C Roby Lofts, [place] . Sande Rives, PA-C

## 2019-01-05 ENCOUNTER — Telehealth: Payer: Self-pay

## 2019-01-05 DIAGNOSIS — E113593 Type 2 diabetes mellitus with proliferative diabetic retinopathy without macular edema, bilateral: Secondary | ICD-10-CM | POA: Diagnosis not present

## 2019-01-05 DIAGNOSIS — H43822 Vitreomacular adhesion, left eye: Secondary | ICD-10-CM | POA: Diagnosis not present

## 2019-01-05 DIAGNOSIS — H3582 Retinal ischemia: Secondary | ICD-10-CM | POA: Diagnosis not present

## 2019-01-05 DIAGNOSIS — H4311 Vitreous hemorrhage, right eye: Secondary | ICD-10-CM | POA: Diagnosis not present

## 2019-01-05 LAB — HM DIABETES EYE EXAM

## 2019-01-05 NOTE — Telephone Encounter (Signed)
   [facility] Pre-operative Risk Assessment    Request for surgical clearance:  1. What type of surgery is being performed? Vitrectomy, laser, endocautery intravitreal of Eylea right eye   2. When is this surgery scheduled? 01/20/19  3. What type of clearance is required (medical clearance vs. Pharmacy clearance to hold med vs. Both)? Both  4. Are there any medications that need to be held prior to surgery and how long?Aspirin-7 days prior   5. Practice name and name of physician performing surgery?  [facility]   6. What is your office phone number [phone]    7.   What is your office fax number [phone]  8.   Anesthesia type (None, local, MAC, general) ? MAC   Meryl Crutch 01/05/2019, 3:09 PM  _________________________________________________________________   (provider comments below)

## 2019-01-05 NOTE — Telephone Encounter (Signed)
Dr Pat Kocher saw this patient yesterday. Can you please comment on surgical clearance and holding ASA?

## 2019-01-06 DIAGNOSIS — Z794 Long term (current) use of insulin: Secondary | ICD-10-CM | POA: Diagnosis not present

## 2019-01-06 DIAGNOSIS — Z9861 Coronary angioplasty status: Secondary | ICD-10-CM | POA: Diagnosis not present

## 2019-01-06 DIAGNOSIS — I10 Essential (primary) hypertension: Secondary | ICD-10-CM | POA: Diagnosis not present

## 2019-01-06 DIAGNOSIS — E1159 Type 2 diabetes mellitus with other circulatory complications: Secondary | ICD-10-CM | POA: Diagnosis not present

## 2019-01-06 DIAGNOSIS — Z5181 Encounter for therapeutic drug level monitoring: Secondary | ICD-10-CM | POA: Diagnosis not present

## 2019-01-06 DIAGNOSIS — I2510 Atherosclerotic heart disease of native coronary artery without angina pectoris: Secondary | ICD-10-CM | POA: Diagnosis not present

## 2019-01-06 DIAGNOSIS — E118 Type 2 diabetes mellitus with unspecified complications: Secondary | ICD-10-CM | POA: Diagnosis not present

## 2019-01-06 LAB — COMPREHENSIVE METABOLIC PANEL
ALT: 12 IU/L (ref 0–44)
AST: 15 IU/L (ref 0–40)
Albumin/Globulin Ratio: 1.9 (ref 1.2–2.2)
Albumin: 4.5 g/dL (ref 3.8–4.8)
Alkaline Phosphatase: 60 IU/L (ref 39–117)
BUN/Creatinine Ratio: 27 — ABNORMAL HIGH (ref 10–24)
BUN: 36 mg/dL — ABNORMAL HIGH (ref 8–27)
Bilirubin Total: 0.3 mg/dL (ref 0.0–1.2)
CO2: 22 mmol/L (ref 20–29)
Calcium: 9.2 mg/dL (ref 8.6–10.2)
Chloride: 98 mmol/L (ref 96–106)
Creatinine, Ser: 1.33 mg/dL — ABNORMAL HIGH (ref 0.76–1.27)
GFR calc Af Amer: 65 mL/min/{1.73_m2} (ref >59)
GFR calc non Af Amer: 56 mL/min/{1.73_m2} — ABNORMAL LOW (ref >59)
Globulin, Total: 2.4 g/dL (ref 1.5–4.5)
Glucose: 171 mg/dL — ABNORMAL HIGH (ref 65–99)
Potassium: 4.5 mmol/L (ref 3.5–5.2)
Sodium: 137 mmol/L (ref 134–144)
Total Protein: 6.9 g/dL (ref 6.0–8.5)

## 2019-01-06 LAB — LIPID PANEL
Chol/HDL Ratio: 3.9 ratio (ref 0.0–5.0)
Cholesterol, Total: 116 mg/dL (ref 100–199)
HDL: 30 mg/dL — ABNORMAL LOW (ref >39)
LDL Calculated: 58 mg/dL (ref 0–99)
Triglycerides: 141 mg/dL (ref 0–149)
VLDL Cholesterol Cal: 28 mg/dL (ref 5–40)

## 2019-01-06 NOTE — Telephone Encounter (Signed)
OK to hold aspirin up to 5 days if needed.  Low risk for surgery.

## 2019-01-06 NOTE — Telephone Encounter (Signed)
   Primary Cardiologist: Skeet Latch, MD  Chart reviewed as part of pre-operative protocol coverage. Patient was contacted 01/06/2019 in reference to pre-operative risk assessment for pending surgery as outlined below.  Jeffrey [name] was last seen on 01/04/19 by Dr. Oval Linsey.  Since that day, Cortlandt Capuano has done well.  Per Dr. Oval Linsey: OK to hold aspirin up to 5 days if needed.  Low risk for surgery.  Therefore, based on ACC/AHA guidelines, the patient would be at acceptable risk for the planned procedure without further cardiovascular testing.   I will route this recommendation to the requesting party via Epic fax function and remove from pre-op pool.  Please call with questions.  [place], PA 01/06/2019, 4:08 PM

## 2019-01-07 NOTE — Telephone Encounter (Signed)
F/U Message            The referring office is calling checking the surgery clearance.status, pls call/fax information over

## 2019-01-07 NOTE — Telephone Encounter (Signed)
Returned call to [facility] she states that they did not receive fax on 01-05-2019. Forwarded again to requesting party via EPIC fax function. Melissa will CB at the end of the day if she has not received this communication

## 2019-01-10 ENCOUNTER — Other Ambulatory Visit: Payer: Self-pay | Admitting: Family Medicine

## 2019-01-11 ENCOUNTER — Telehealth: Payer: Self-pay | Admitting: Cardiovascular Disease

## 2019-01-11 NOTE — Telephone Encounter (Signed)
Will route to callback to assist and f/u to make sure received. Appears to be phone note from last wk. Simaya Lumadue PA-C

## 2019-01-11 NOTE — Telephone Encounter (Signed)
Follow Up:    Jeffrey [name] would like for yo uto refax clearance. Their fax machine was acting up. Please fax to [phone].

## 2019-01-11 NOTE — Telephone Encounter (Signed)
Faxed clearance over to [phone] as requested.

## 2019-01-12 ENCOUNTER — Telehealth: Payer: Self-pay | Admitting: Physical Therapy

## 2019-01-12 NOTE — Telephone Encounter (Signed)
Patient stated he received a call from [facility] stating he was from [facility] to do a trial study on Rx Juandice. Patient wants to know what Dr.Parker suggest on medication.He does not want to do a study,but if Dr.Parker agrees with taking Rx he agree.Also wants to inform Eye surgery on 06/11.

## 2019-01-12 NOTE — Telephone Encounter (Signed)
Copied from [place] [id number]. Topic: General - Other >> Jan 12, 2019 10:45 AM Lennox Solders wrote: Reason for CRM: pt is calling and requesting to talk with amber concerning cardiologist. Please call pt back. I wanted to speak to office and after several attempts the line was still busy

## 2019-01-13 NOTE — Telephone Encounter (Signed)
Called pt and advised. He does not want to participate in the study. He was just wondering if Dr. [name] thought it would be a good idea for him to start on Jardiance and if so, he would like Dr. [name] to send in a prescription. He only wants to do this is Dr. [name] thinks its a good idea.   Forwarding to Dr. [name] to confirm if he is OK managing this medication and if so to confirm dose.

## 2019-01-13 NOTE — Telephone Encounter (Signed)
Jardiance would be a good medication for him. We have discussed starting it in the past. I would be fine with him enrolling in the study if he wants to do that.  [name]. [name], MD 01/13/2019 10:23 AM

## 2019-01-13 NOTE — Telephone Encounter (Signed)
Ok if he does not want to be part of the study.  Ok to send in Bovill 10mg  daily. Would like for him to come back as scheduled to check his A1c.  [name]. [name], MD 01/13/2019 4:33 PM

## 2019-01-14 ENCOUNTER — Encounter: Payer: Self-pay | Admitting: *Deleted

## 2019-01-14 NOTE — Progress Notes (Signed)
Had been referred by Dr. Oval Linsey for the Coordinate DM study.  Sent Dr. [name] information after speaking with patient.  He has decided not to participate in the study.  Thanks Dr Oval Linsey for the referral.  Thanks Dr [name] and staff for reaching out and assisting patient.

## 2019-01-14 NOTE — Telephone Encounter (Signed)
Patient decided not to take Jardiance he will continue current medications and wait to his follow up to discuss.

## 2019-01-20 DIAGNOSIS — H4311 Vitreous hemorrhage, right eye: Secondary | ICD-10-CM | POA: Diagnosis not present

## 2019-01-20 DIAGNOSIS — E113511 Type 2 diabetes mellitus with proliferative diabetic retinopathy with macular edema, right eye: Secondary | ICD-10-CM | POA: Diagnosis not present

## 2019-01-21 ENCOUNTER — Telehealth: Payer: Self-pay | Admitting: Cardiovascular Disease

## 2019-01-21 DIAGNOSIS — E113591 Type 2 diabetes mellitus with proliferative diabetic retinopathy without macular edema, right eye: Secondary | ICD-10-CM | POA: Diagnosis not present

## 2019-01-21 NOTE — Telephone Encounter (Signed)
Patient called and was insistent on speaking with Dr. Blenda Mounts assistant. She would not disclosed the details of the call to me.

## 2019-01-21 NOTE — Telephone Encounter (Signed)
Spoke with pt, they brought a baby gift to dr Oval Linsey about 2 weeks ago and they have not heard anything and they want to make sure she got the gift. Will contact dr Blenda Mounts nurse, melinda to see if she knows.

## 2019-01-21 NOTE — Telephone Encounter (Signed)
Spoke with pt, aware gift is still in the office, will make dr Oval Linsey aware gift is here.

## 2019-01-28 DIAGNOSIS — E113593 Type 2 diabetes mellitus with proliferative diabetic retinopathy without macular edema, bilateral: Secondary | ICD-10-CM | POA: Diagnosis not present

## 2019-02-01 NOTE — Telephone Encounter (Signed)
Follow up    Patient is calling about the gift that should be there for Dr. Oval Linsey

## 2019-02-01 NOTE — Telephone Encounter (Signed)
Advised patient Dr Oval Linsey received and sent thank you note

## 2019-02-08 ENCOUNTER — Ambulatory Visit (INDEPENDENT_AMBULATORY_CARE_PROVIDER_SITE_OTHER): Payer: PPO | Admitting: Family Medicine

## 2019-02-08 ENCOUNTER — Other Ambulatory Visit: Payer: Self-pay

## 2019-02-08 ENCOUNTER — Encounter: Payer: Self-pay | Admitting: Family Medicine

## 2019-02-08 VITALS — BP 116/70 | HR 93 | Temp 98.1°F | Ht 68.0 in | Wt 156.0 lb

## 2019-02-08 DIAGNOSIS — Z79899 Other long term (current) drug therapy: Secondary | ICD-10-CM

## 2019-02-08 DIAGNOSIS — I5042 Chronic combined systolic (congestive) and diastolic (congestive) heart failure: Secondary | ICD-10-CM | POA: Diagnosis not present

## 2019-02-08 DIAGNOSIS — I152 Hypertension secondary to endocrine disorders: Secondary | ICD-10-CM

## 2019-02-08 DIAGNOSIS — I5032 Chronic diastolic (congestive) heart failure: Secondary | ICD-10-CM | POA: Insufficient documentation

## 2019-02-08 DIAGNOSIS — R634 Abnormal weight loss: Secondary | ICD-10-CM

## 2019-02-08 DIAGNOSIS — E1159 Type 2 diabetes mellitus with other circulatory complications: Secondary | ICD-10-CM | POA: Diagnosis not present

## 2019-02-08 DIAGNOSIS — I10 Essential (primary) hypertension: Secondary | ICD-10-CM

## 2019-02-08 DIAGNOSIS — R600 Localized edema: Secondary | ICD-10-CM

## 2019-02-08 LAB — COMPREHENSIVE METABOLIC PANEL
ALT: 12 U/L (ref 0–53)
AST: 12 U/L (ref 0–37)
Albumin: 4.2 g/dL (ref 3.5–5.2)
Alkaline Phosphatase: 60 U/L (ref 39–117)
BUN: 29 mg/dL — ABNORMAL HIGH (ref 6–23)
CO2: 27 mEq/L (ref 19–32)
Calcium: 9.3 mg/dL (ref 8.4–10.5)
Chloride: 101 mEq/L (ref 96–112)
Creatinine, Ser: 1.37 mg/dL (ref 0.40–1.50)
GFR: 52.27 mL/min — ABNORMAL LOW (ref >60.00)
Glucose, Bld: 184 mg/dL — ABNORMAL HIGH (ref 70–99)
Potassium: 4.7 mEq/L (ref 3.5–5.1)
Sodium: 138 mEq/L (ref 135–145)
Total Bilirubin: 0.4 mg/dL (ref 0.2–1.2)
Total Protein: 6.6 g/dL (ref 6.0–8.3)

## 2019-02-08 LAB — CBC
HCT: 32.8 % — ABNORMAL LOW (ref 39.0–52.0)
Hemoglobin: 11.2 g/dL — ABNORMAL LOW (ref 13.0–17.0)
MCHC: 34.1 g/dL (ref 30.0–36.0)
MCV: 92 fl (ref 78.0–100.0)
Platelets: 392 10*3/uL (ref 150.0–400.0)
RBC: 3.57 Mil/uL — ABNORMAL LOW (ref 4.22–5.81)
RDW: 13 % (ref 11.5–15.5)
WBC: 11.3 10*3/uL — ABNORMAL HIGH (ref 4.0–10.5)

## 2019-02-08 LAB — PSA: PSA: 1.74 ng/mL (ref 0.10–4.00)

## 2019-02-08 LAB — SEDIMENTATION RATE: Sed Rate: 7 mm/hr (ref 0–20)

## 2019-02-08 LAB — TSH: TSH: 2.3 u[IU]/mL (ref 0.35–4.50)

## 2019-02-08 LAB — VITAMIN B12: Vitamin B-12: 201 pg/mL — ABNORMAL LOW (ref 211–911)

## 2019-02-08 LAB — C-REACTIVE PROTEIN: CRP: <1 mg/dL (ref 0.5–20.0)

## 2019-02-08 NOTE — Assessment & Plan Note (Signed)
Stable.  Continue spironolactone 25 mg daily, losartan 25 mg daily, and Coreg 3.125 mg daily.

## 2019-02-08 NOTE — Progress Notes (Signed)
   Chief Complaint:  Jeffrey [name] is a 63 y.o. male who presents today with a chief complaint of lower extremity edema.   Assessment/Plan:  Pedal Edema Multifactorial in setting of heart failure and likely venous insufficiency.  No signs of volume overload.  Continue spironolactone 25 mg daily and Lasix 40 mg daily.  Will check CBC, CMET, and UA.  Encouraged frequent elevation and low-salt diet.  May need lower extremity Dopplers to evaluate for venous insufficiency if symptoms persist and above is negative.  Unintentional weight loss Patient with documented 30 pound weight loss over the last year and a half.  He was recently started on metformin which could be contributing some however would not expect this to cause 30 pound weight loss.  Will check labs today including CBC, C met, TSH, CRP, sed rate, B12, and PSA.  Also check UA.  Hypertension associated with diabetes ([place]) Stable.  Continue spironolactone 25 mg daily, losartan 25 mg daily, and Coreg 3.125 mg daily.    Subjective:  HPI:  Lower Extremity Edema  Chronic problem but has worsened over the last of couple of months. He currently takes lasix 87m daily as needed but does not think this is effective any longer.  He has been elevating his feet which helps.  He has a brisk diuretic response to Lasix however has persistent lower extremity edema more specifically over the hospital his feet and ankles bilaterally.  No dysuria.  No urinary symptoms.  No fever or chills.  No chest pain or shortness of breath.  No recent diet changes. No increased salt intake.   Weight Loss Patient is concerned about weight loss. Thinks that he has lost about 30 pounds over the past year and 20 pounds within the past few months.  He has not lost any weight intentionally.  He has been trying to eat more food however has had some difficulty maintaining his weight.  No reported night sweats.  No reported fevers or chills.  No reported cough or pain.  His  stable, chronic medical conditions are outlined below:   # T2DM  -Currently on  Lantus 20 units in the morning and metformin 1000 mg twice daily. -Home sugar readings in 100s -ROS: No reported polyuria or polydipsia  ROS: Per HPI  PMH: He reports that he has never smoked. He has never used smokeless tobacco. He reports current alcohol use of about 1.0 standard drinks of alcohol per week. He reports that he does not use drugs.      Objective:  Physical Exam: BP 116/70 (BP Location: Left Arm, Patient Position: Sitting, Cuff Size: Normal)   Pulse 93   Temp 98.1 F (36.7 C) (Oral)   Ht _0  (1.727 m)   Wt 156 lb (70.8 kg)   SpO2 100%   BMI 23.72 kg/m   Wt Readings from Last 3 Encounters:  02/08/19 156 lb (70.8 kg)  01/04/19 156 lb 6.4 oz (70.9 kg)  12/08/18 155 lb (70.3 kg)  Gen: NAD, resting comfortably CV: Regular rate and rhythm with no murmurs appreciated Pulm: Normal work of breathing, clear to auscultation bilaterally with no crackles, wheezes, or rhonchi MSK: Bilateral lower extremities with 1+ pitting edema to ankle bilaterally.  Neurovascular intact      Jeffrey M. PJerline Pain MD 02/08/2019 3:54 PM

## 2019-02-08 NOTE — Patient Instructions (Addendum)
It was very nice to see you today!  We will check blood work today to look for other causes for your weight loss.   Take care, Dr [name]

## 2019-02-09 ENCOUNTER — Telehealth: Payer: Self-pay | Admitting: Family Medicine

## 2019-02-09 ENCOUNTER — Other Ambulatory Visit: Payer: Self-pay

## 2019-02-09 DIAGNOSIS — R634 Abnormal weight loss: Secondary | ICD-10-CM

## 2019-02-09 NOTE — Progress Notes (Signed)
Please inform patient of the following:  He is slightly anemic and his B12 levels are low.  No other obvious causes for his weight loss.  Would like to start him on B12 protocol.  Would also like to check FOBT to make sure he is not losing blood through his GI tract. - please ask pt to schedule lab appointment to set this up.  [name]. [name], MD 02/09/2019 11:14 AM

## 2019-02-09 NOTE — Telephone Encounter (Signed)
° °  Caller Name: Jeffrey [name] / Cathie Beams ( [place])  [phone]  Phone Jeffrey [name] # [phone]   Reason for Call:   Jeffrey [name] inquiring about lab results, Jeffrey [name] was unclear requesting a follow up call tomorrow 02/10/2019.   Insurance and Jeffrey [name] requesting clarity regarding what kind of stool sample test is needed in addition to B12 CPT code, please advise Jeffrey [name] express frustration.

## 2019-02-10 NOTE — Telephone Encounter (Signed)
Pt would like a call from [employer] or someone assisting Dr. [name]. He has multiple questions regarding B12 and a stool sample. Please advise. Very adamant about having a call. He stated he was supposed to be called back this morning.

## 2019-02-10 NOTE — Telephone Encounter (Signed)
Patient's heart is still relatively strong.  Typically would not see cardiac cachexia until end-stage heart failure, which he does not have.

## 2019-02-10 NOTE — Telephone Encounter (Signed)
Patient will call to schedule a lab and nurse visit. Patient stated is it possibly that he has cardiac cachexia due to the symptoms he is having. Please advise

## 2019-02-13 ENCOUNTER — Other Ambulatory Visit: Payer: Self-pay | Admitting: Family Medicine

## 2019-02-15 ENCOUNTER — Other Ambulatory Visit: Payer: Self-pay

## 2019-02-15 ENCOUNTER — Telehealth: Payer: Self-pay | Admitting: Family Medicine

## 2019-02-15 ENCOUNTER — Ambulatory Visit (INDEPENDENT_AMBULATORY_CARE_PROVIDER_SITE_OTHER): Payer: PPO

## 2019-02-15 DIAGNOSIS — E538 Deficiency of other specified B group vitamins: Secondary | ICD-10-CM | POA: Diagnosis not present

## 2019-02-15 MED ORDER — CYANOCOBALAMIN 1000 MCG/ML IJ SOLN
1000.0000 ug | Freq: Once | INTRAMUSCULAR | Status: AC
  Administered 2019-02-15: 1000 ug via INTRAMUSCULAR

## 2019-02-15 NOTE — Telephone Encounter (Signed)
Patient came into the office and stated that "he only wanted Amber to do his nurse visit today." [place] checked with Amber and was advised that she was finishing up however, "it may be a while before she could get to him." Madelyn reached out to another nurse to get the patient seen as soon as possible. While she was waiting on a response, she asked "to see if he would like to get his labs done prior to" however, the lab was full and he would have to wait. Madelyn asked the patient to "have a seat in the lobby so she could get a clinical person to come assist."   The patient began to raise his voice saying that "this was not going smoothly and he wanted Amber only for his 3:30 appointment." I stepped in as I was in the back doing paperwork and overheard the entire discussion and it needed to deescalate.   I greeted the patient and advised that "Amber was busy at the time however, he can wait for her or we can have someone else come get him going with his appointment." He stated that "Madelyn was trying to push him off on someone else." I clarified with him that "she was not trying to push him off on someone else but rather get him seen sooner so he did not have to wait so long."   Tillie Rung came out during this discussion and called his name to come back, the patient stated "I'm not going with you." I explained to [place] that "I was handling the patient's concerns and he would only like Amber." I explained to the patient that since he only wants Amber he will have to be patient." Patient stated he understood. Amber came to get the patinet.   No further action required just wanted to document the discussion in case it came up at another time to best assist.

## 2019-02-15 NOTE — Progress Notes (Signed)
Per orders of Dr. [name], injection of vitamin B12 1000 mcg given in left deltoid by Gertie Exon, CMA.  Patient tolerated injection well.  Patient will return in 1 week for next injection.

## 2019-02-21 ENCOUNTER — Telehealth: Payer: Self-pay | Admitting: Family Medicine

## 2019-02-21 NOTE — Telephone Encounter (Signed)
See note  Copied from [place] [id number]. Topic: General - Other >> Feb 21, 2019  8:22 AM Leward Quan A wrote: Reason for CRM: Patient called to say that he will be coming into the office to drop off a stool sample. Please advise

## 2019-02-22 ENCOUNTER — Other Ambulatory Visit: Payer: Self-pay

## 2019-02-22 ENCOUNTER — Other Ambulatory Visit (INDEPENDENT_AMBULATORY_CARE_PROVIDER_SITE_OTHER): Payer: PPO

## 2019-02-22 ENCOUNTER — Ambulatory Visit (INDEPENDENT_AMBULATORY_CARE_PROVIDER_SITE_OTHER): Payer: PPO

## 2019-02-22 DIAGNOSIS — R634 Abnormal weight loss: Secondary | ICD-10-CM | POA: Diagnosis not present

## 2019-02-22 DIAGNOSIS — E538 Deficiency of other specified B group vitamins: Secondary | ICD-10-CM | POA: Diagnosis not present

## 2019-02-22 DIAGNOSIS — E113593 Type 2 diabetes mellitus with proliferative diabetic retinopathy without macular edema, bilateral: Secondary | ICD-10-CM | POA: Diagnosis not present

## 2019-02-22 MED ORDER — CYANOCOBALAMIN 1000 MCG/ML IJ SOLN
1000.0000 ug | Freq: Once | INTRAMUSCULAR | Status: AC
  Administered 2019-02-22: 1000 ug via INTRAMUSCULAR

## 2019-02-22 NOTE — Progress Notes (Signed)
Per orders of Dr. [name], injection of B12 given in right deltoid by Sandford Craze, RN  Patient tolerated injection well.

## 2019-02-23 LAB — FECAL OCCULT BLOOD, IMMUNOCHEMICAL: Fecal Occult Bld: POSITIVE — AB

## 2019-02-23 NOTE — Telephone Encounter (Signed)
Noted  

## 2019-02-24 ENCOUNTER — Other Ambulatory Visit: Payer: Self-pay

## 2019-02-24 ENCOUNTER — Encounter: Payer: Self-pay | Admitting: Gastroenterology

## 2019-02-24 DIAGNOSIS — K921 Melena: Secondary | ICD-10-CM

## 2019-02-24 NOTE — Telephone Encounter (Signed)
Results given to patient

## 2019-02-24 NOTE — Telephone Encounter (Signed)
Pt is calling to see if labs are ready. Please call back [phone]

## 2019-02-24 NOTE — Telephone Encounter (Signed)
See note

## 2019-02-24 NOTE — Progress Notes (Signed)
Please inform patient of the following:  He has some signs of blood in his stool. Recommend urgent GI referral.  Caleb M. [name], MD 02/24/2019 12:33 PM

## 2019-03-01 ENCOUNTER — Other Ambulatory Visit: Payer: Self-pay

## 2019-03-01 ENCOUNTER — Ambulatory Visit (INDEPENDENT_AMBULATORY_CARE_PROVIDER_SITE_OTHER): Payer: PPO

## 2019-03-01 DIAGNOSIS — E538 Deficiency of other specified B group vitamins: Secondary | ICD-10-CM | POA: Diagnosis not present

## 2019-03-01 MED ORDER — CYANOCOBALAMIN 1000 MCG/ML IJ SOLN
1000.0000 ug | Freq: Once | INTRAMUSCULAR | Status: AC
  Administered 2019-03-01: 1000 ug via INTRAMUSCULAR

## 2019-03-01 NOTE — Progress Notes (Signed)
Per orders of Dr.Hunter  injection of Vitamin B 12  given by Loralyn Freshwater.Given in patient left deltoid. Patient  tolerated injection well.

## 2019-03-08 ENCOUNTER — Other Ambulatory Visit: Payer: Self-pay

## 2019-03-08 ENCOUNTER — Ambulatory Visit (INDEPENDENT_AMBULATORY_CARE_PROVIDER_SITE_OTHER): Payer: PPO

## 2019-03-08 DIAGNOSIS — E538 Deficiency of other specified B group vitamins: Secondary | ICD-10-CM

## 2019-03-08 MED ORDER — CYANOCOBALAMIN 1000 MCG/ML IJ SOLN
1000.0000 ug | Freq: Once | INTRAMUSCULAR | Status: AC
  Administered 2019-03-08: 1000 ug via INTRAMUSCULAR

## 2019-03-08 NOTE — Progress Notes (Signed)
Per orders of Dr. Juleen China  injection of Vitamin B 12 given by Loralyn Freshwater.Given in right deltoid.Patient tolerated injection well.

## 2019-03-10 ENCOUNTER — Encounter: Payer: Self-pay | Admitting: Gastroenterology

## 2019-03-10 NOTE — Progress Notes (Signed)
I have reviewed the patient's encounter and agree with the documentation.  Jeffrey [name] M. Vibha Ferdig, MD 03/10/2019 4:35 PM   

## 2019-03-14 ENCOUNTER — Ambulatory Visit: Payer: PPO | Admitting: Gastroenterology

## 2019-03-14 ENCOUNTER — Encounter: Payer: Self-pay | Admitting: Gastroenterology

## 2019-03-14 VITALS — BP 124/60 | HR 74 | Temp 97.8°F | Ht 68.0 in | Wt 161.0 lb

## 2019-03-14 DIAGNOSIS — Z794 Long term (current) use of insulin: Secondary | ICD-10-CM

## 2019-03-14 DIAGNOSIS — R195 Other fecal abnormalities: Secondary | ICD-10-CM | POA: Diagnosis not present

## 2019-03-14 DIAGNOSIS — R634 Abnormal weight loss: Secondary | ICD-10-CM

## 2019-03-14 DIAGNOSIS — D649 Anemia, unspecified: Secondary | ICD-10-CM | POA: Diagnosis not present

## 2019-03-14 DIAGNOSIS — E119 Type 2 diabetes mellitus without complications: Secondary | ICD-10-CM

## 2019-03-14 DIAGNOSIS — IMO0001 Reserved for inherently not codable concepts without codable children: Secondary | ICD-10-CM

## 2019-03-14 MED ORDER — SUPREP BOWEL PREP KIT 17.5-3.13-1.6 GM/177ML PO SOLN: ORAL | 0 refills | Status: DC

## 2019-03-14 NOTE — Progress Notes (Signed)
03/14/2019 Jeffrey [name] [id number] Apr 12, 1956   HISTORY OF PRESENT ILLNESS: This is a 63 year old male who is new to our practice.  He has been referred here by his PCP, Dr. Dimas Chyle, for evaluation regarding weight loss and Hemoccult positive stools.  The patient comes in today and tells me that he has lost 30 pounds in 6 months.  He says he is also losing muscle mass and his clothes are falling off of him.  He has not been dieting.  He saw his PCP with this concern and also reported some change in his bowel habits with loose/sludgy stools over the past 5 months as well.  IFOB stool study was performed and was positive.  He denies any evidence of frank bleeding including black stools.  His hemoglobin is mildly low at 11.2 g as compared to 13 g just 9 months ago.  MCV is normal.  He absolutely denies any abdominal pain or any type of upper GI symptoms including dysphagia, reflux, nausea, or vomiting.  He had a colonoscopy in January 2008 in [place], [place].  At that time is found to have only diverticulosis and internal hemorrhoids.  Another colonoscopy in January 2015 in [place], [place] showed diverticulosis, hemorrhoids, and one polyp that was removed.  On pathology, however, the polyp was benign mucosa without adenomatous changes.   Past Medical History:  Diagnosis Date   Cardiac arrest ([place]) 05/22/2016   CHF (congestive heart failure) ([place])    Diabetes mellitus without complication ([place])    Diabetic retinopathy ([place])    Hyperlipidemia    Hypertension    Myocardial infarct [facility]) 2105   S/P primary angioplasty with coronary stent 05/22/2016   Vitamin B12 deficiency    Past Surgical History:  Procedure Laterality Date   ANGIOPLASTY     2015   REFRACTIVE SURGERY     VITRECTOMY      reports that he has never smoked. He has never used smokeless tobacco. He reports current alcohol use of about 1.0 standard drinks of alcohol per week. He reports that he does  not use drugs. family history includes Alzheimer's disease in his mother; Heart disease in his brother and father; Peripheral Artery Disease in his father. Allergies  Allergen Reactions   Codeine Nausea And Vomiting   Penicillins Rash      Outpatient Encounter Medications as of 03/14/2019  Medication Sig   aspirin EC 81 MG tablet Take 81 mg by mouth daily.   atorvastatin (LIPITOR) 80 MG tablet TAKE 1 TABLET BY MOUTH EVERY DAY   Blood Glucose Monitoring Suppl (ONETOUCH VERIO IQ SYSTEM) w/Device KIT 1 kit by Does not apply route 4 (four) times daily.   carvedilol (COREG) 3.125 MG tablet TAKE 1 TABLET (3.125 MG TOTAL) BY MOUTH 2 (TWO) TIMES DAILY WITH A MEAL.   cholecalciferol (VITAMIN D) 1000 units tablet Take 2,000 Units by mouth daily.   furosemide (LASIX) 40 MG tablet TAKE 1 TABLET BY MOUTH EVERY DAY   glucose blood test strip Check blood sugar 4 times per day   insulin glargine (LANTUS) 100 UNIT/ML injection Inject 15-28 units twice daily.   Insulin Syringe-Needle U-100 (B-D INS SYR HALF-UNIT .3CC/31G) 31G X 5/16" 0.3 ML MISC Use 6 times a day   ipratropium (ATROVENT) 0.06 % nasal spray Place 2 sprays into both nostrils 4 (four) times daily.   Lancets (ONETOUCH ULTRASOFT) lancets Check blood sugar 4 times per day   losartan (COZAAR) 25 MG tablet TAKE  1 TABLET BY MOUTH EVERY DAY   metFORMIN (GLUCOPHAGE-XR) 500 MG 24 hr tablet TAKE 2 TABLETS BY MOUTH TWICE A DAY   spironolactone (ALDACTONE) 25 MG tablet Take 1 tablet (25 mg total) by mouth daily.   [DISCONTINUED] metoCLOPramide (REGLAN) 10 MG tablet Take 1 tablet (10 mg total) by mouth 4 (four) times daily. (Patient not taking: Reported on 03/14/2019)   [DISCONTINUED] omeprazole (PRILOSEC) 40 MG capsule Take 1 capsule (40 mg total) by mouth 2 (two) times daily. (Patient not taking: Reported on 03/14/2019)   No facility-administered encounter medications on file as of 03/14/2019.      REVIEW OF SYSTEMS  : All other systems  reviewed and negative except where noted in the History of Present Illness.   PHYSICAL EXAM: BP 124/60    Pulse 74    Temp 97.8 F (36.6 C) (Temporal)    Ht '5\' 8"'  (1.727 m)    Wt 161 lb (73 kg)    SpO2 95%    BMI 24.48 kg/m  General: Well developed white male in no acute distress Head: Normocephalic and atraumatic Eyes:  Sclerae anicteric, conjunctiva pink. Ears: Normal auditory acuity Lungs: Clear throughout to auscultation; no increased WOB. Heart: Regular rate and rhythm; no M/R/G. Abdomen: Soft, non-distended.  BS present.  Non-tender. Rectal:  Will be done at the time of colonoscopy. Musculoskeletal: Symmetrical with no gross deformities  Skin: No lesions on visible extremities Extremities: No edema  Neurological: Alert oriented x 4, grossly non-focal Psychological:  Alert and cooperative. Normal mood and affect  ASSESSMENT AND PLAN: *63 year old male with 30 pound weight loss over the past 6 months and now a positive IFOB stool test.  He is also had some change in bowel habits with loose/sludgy stools over the past 5 months as well.  Definitely needs colonoscopy.  I also discussed with him about performing EGD, but due to lack of any other upper GI symptoms he elected to decline that study for now.  Very concerned about costs.  Will schedule with Dr. Henrene Pastor.   *Mild normocytic anemia:  Hgb 11.2 grams, but was 13 grams just 9 months ago.  No frank bleeding. *IDDM:  Insulin will be adjusted prior to endoscopic procedure per protocol. Will resume normal dosing after procedure.  **The risks, benefits, and alternatives to colonoscopy were discussed with the patient and he consents to proceed.   CC:  Vivi Barrack, MD

## 2019-03-14 NOTE — Patient Instructions (Addendum)
If you are age 64 or older, your body mass index should be between 23-30. Your Body mass index is 24.48 kg/m. If this is out of the aforementioned range listed, please consider follow up with your Primary Care Provider.  If you are age 50 or younger, your body mass index should be between 19-25. Your Body mass index is 24.48 kg/m. If this is out of the aformentioned range listed, please consider follow up with your Primary Care Provider.   To help prevent the possible spread of infection to our patients, communities, and staff; we will be implementing the following measures:  As of now we are not allowing any visitors/family members to accompany you to any upcoming appointments with [facility]. If you have any concerns about this please contact our office to discuss prior to the appointment.   You have been scheduled for a colonoscopy. Please follow written instructions given to you at your visit today.  Please pick up your prep supplies at the pharmacy within the next 1-3 days. If you use inhalers (even only as needed), please bring them with you on the day of your procedure.   Thank you for entrusting me with your care and for choosing [employer], Alonza Bogus, P.A. - C.

## 2019-03-15 ENCOUNTER — Telehealth: Payer: Self-pay | Admitting: Gastroenterology

## 2019-03-15 DIAGNOSIS — R195 Other fecal abnormalities: Secondary | ICD-10-CM

## 2019-03-15 DIAGNOSIS — R634 Abnormal weight loss: Secondary | ICD-10-CM

## 2019-03-15 DIAGNOSIS — D649 Anemia, unspecified: Secondary | ICD-10-CM

## 2019-03-15 NOTE — Progress Notes (Signed)
Assessment and plan reviewed 

## 2019-03-15 NOTE — Telephone Encounter (Signed)
The pt would like to add EGD to his already scheduled colon.  However, there is not sufficient appt time for both procedures.  I have rescheduled to 9/1 and sent new instructions to the pt via mail. I have also sent a new amb ref for the EGD and sent Amy Hazelwood a staff message that the EGD was added.

## 2019-03-17 ENCOUNTER — Other Ambulatory Visit: Payer: Self-pay

## 2019-03-17 MED ORDER — ONETOUCH ULTRASOFT LANCETS MISC: 11 refills | Status: DC

## 2019-03-18 ENCOUNTER — Other Ambulatory Visit: Payer: Self-pay

## 2019-03-18 ENCOUNTER — Telehealth: Payer: Self-pay | Admitting: Family Medicine

## 2019-03-18 MED ORDER — ONETOUCH DELICA LANCETS 33G MISC: 11 refills | Status: DC

## 2019-03-18 MED ORDER — GLUCOSE BLOOD VI STRP: ORAL_STRIP | 11 refills | Status: DC

## 2019-03-18 NOTE — Telephone Encounter (Signed)
Copied from [place] [id number]. Topic: Quick Communication - Rx Refill/Question >> Mar 18, 2019  1:11 PM Erick Blinks wrote: Medication: one touch delica lancets - needed for his meter, instead of one touch ultra soft  Preferred Pharmacy (with phone number or street name): [id number] [address], [place] [address] Phone: [phone] Fax: [phone]  Agent: Please be advised that RX refills may take up to 3 business days. We ask that you follow-up with your pharmacy.

## 2019-03-18 NOTE — Telephone Encounter (Signed)
Rx sent to pharmacy   

## 2019-03-29 ENCOUNTER — Ambulatory Visit (INDEPENDENT_AMBULATORY_CARE_PROVIDER_SITE_OTHER): Payer: PPO | Admitting: Family Medicine

## 2019-03-29 ENCOUNTER — Encounter: Payer: Self-pay | Admitting: Family Medicine

## 2019-03-29 VITALS — BP 122/74 | HR 72 | Temp 97.8°F | Ht 68.0 in | Wt 158.4 lb

## 2019-03-29 DIAGNOSIS — D649 Anemia, unspecified: Secondary | ICD-10-CM

## 2019-03-29 DIAGNOSIS — G4730 Sleep apnea, unspecified: Secondary | ICD-10-CM | POA: Diagnosis not present

## 2019-03-29 DIAGNOSIS — IMO0001 Reserved for inherently not codable concepts without codable children: Secondary | ICD-10-CM

## 2019-03-29 DIAGNOSIS — T733XXD Exhaustion due to excessive exertion, subsequent encounter: Secondary | ICD-10-CM | POA: Diagnosis not present

## 2019-03-29 DIAGNOSIS — I2510 Atherosclerotic heart disease of native coronary artery without angina pectoris: Secondary | ICD-10-CM | POA: Diagnosis not present

## 2019-03-29 DIAGNOSIS — Z9861 Coronary angioplasty status: Secondary | ICD-10-CM

## 2019-03-29 DIAGNOSIS — E119 Type 2 diabetes mellitus without complications: Secondary | ICD-10-CM | POA: Diagnosis not present

## 2019-03-29 DIAGNOSIS — R7989 Other specified abnormal findings of blood chemistry: Secondary | ICD-10-CM

## 2019-03-29 DIAGNOSIS — I5042 Chronic combined systolic (congestive) and diastolic (congestive) heart failure: Secondary | ICD-10-CM | POA: Diagnosis not present

## 2019-03-29 DIAGNOSIS — E118 Type 2 diabetes mellitus with unspecified complications: Secondary | ICD-10-CM

## 2019-03-29 DIAGNOSIS — R195 Other fecal abnormalities: Secondary | ICD-10-CM

## 2019-03-29 DIAGNOSIS — E1169 Type 2 diabetes mellitus with other specified complication: Secondary | ICD-10-CM | POA: Diagnosis not present

## 2019-03-29 DIAGNOSIS — I10 Essential (primary) hypertension: Secondary | ICD-10-CM | POA: Diagnosis not present

## 2019-03-29 DIAGNOSIS — Z794 Long term (current) use of insulin: Secondary | ICD-10-CM

## 2019-03-29 DIAGNOSIS — I152 Hypertension secondary to endocrine disorders: Secondary | ICD-10-CM

## 2019-03-29 DIAGNOSIS — E538 Deficiency of other specified B group vitamins: Secondary | ICD-10-CM

## 2019-03-29 DIAGNOSIS — E1159 Type 2 diabetes mellitus with other circulatory complications: Secondary | ICD-10-CM | POA: Diagnosis not present

## 2019-03-29 DIAGNOSIS — E785 Hyperlipidemia, unspecified: Secondary | ICD-10-CM

## 2019-03-29 MED ORDER — CYANOCOBALAMIN 1000 MCG/ML IJ SOLN
1000.0000 ug | Freq: Once | INTRAMUSCULAR | Status: AC
  Administered 2019-03-29: 1000 ug via INTRAMUSCULAR

## 2019-03-29 NOTE — Assessment & Plan Note (Signed)
Possibly source of his fatigue and low energy.  Recent echo with 40 to 50% EF.  Continue management per cardiology.

## 2019-03-29 NOTE — Assessment & Plan Note (Signed)
Has GI work-up pending.

## 2019-03-29 NOTE — Assessment & Plan Note (Signed)
Due to GI losses.  Start ferrous sulfate 325 mg every other day.  Has GI work-up pending.

## 2019-03-29 NOTE — Patient Instructions (Addendum)
It was very nice to see you today!  I will place a referral to palliative care.  We will get you set up for a sleep study.   Please take ferrous sulfate 325mg  every other day.  Increase your lantus to 22 units.  We should continue your B12 for another 6 weeks.   Take care, Dr [name]  Please try these tips to maintain a healthy lifestyle:   Eat at least 3 REAL meals and 1-2 snacks per day.  Aim for no more than 5 hours between eating.  If you eat breakfast, please do so within one hour of getting up.    Obtain twice as many fruits/vegetables as protein or carbohydrate foods for both lunch and dinner. (Half of each meal should be fruits/vegetables, one quarter protein, and one quarter starchy carbs)   Cut down on sweet beverages. This includes juice, soda, and sweet tea.    Exercise at least 150 minutes every week.

## 2019-03-29 NOTE — Assessment & Plan Note (Signed)
At goal.  Continue spironolactone 25 mg daily, losartan 25 mg daily, and Coreg 3.125 mg daily.

## 2019-03-29 NOTE — Assessment & Plan Note (Signed)
B12 given today.  Follow-up with me in 3 months.  Recheck B12 at that point.

## 2019-03-29 NOTE — Assessment & Plan Note (Signed)
Likely multifactorial.  He does have some normocytic anemia which is likely the main driver at this point.  He is currently undergoing GI work-up for heme positive stool.  Will start iron supplementation as noted above.  Encouraged daily exercises.

## 2019-03-29 NOTE — Assessment & Plan Note (Signed)
Patient defers A1c today.  We will increase his Lantus to 22 units.  Hopefully this will help some with his frequent urination and elevated blood sugars.  It is also possible this could also help some with weight loss.  He will also continue taking Metformin 1000 mg twice daily.  Follow-up me in 3 months and recheck A1c at that time.

## 2019-03-29 NOTE — Assessment & Plan Note (Signed)
Place referral to palliative care per family request.

## 2019-03-29 NOTE — Assessment & Plan Note (Addendum)
Continued atorvastatin 80 mg daily.

## 2019-03-29 NOTE — Progress Notes (Signed)
Chief Complaint:  Jeffrey [name] is a 63 y.o. male who presents today with a chief complaint of fatigue.   Assessment/Plan:  Hypertension associated with diabetes ([place]) At goal.  Continue spironolactone 25 mg daily, losartan 25 mg daily, and Coreg 3.125 mg daily.  IDDM (insulin dependent diabetes mellitus) ([name]) Patient defers A1c today.  We will increase his Lantus to 22 units.  Hopefully this will help some with his frequent urination and elevated blood sugars.  It is also possible this could also help some with weight loss.  He will also continue taking Metformin 1000 mg twice daily.  Follow-up me in 3 months and recheck A1c at that time.   Low vitamin B12 level B12 given today.  Follow-up with me in 3 months.  Recheck B12 at that point.  Normocytic anemia Due to GI losses.  Start ferrous sulfate 325 mg every other day.  Has GI work-up pending.  Heme positive stool Has GI work-up pending.  Chronic combined systolic and diastolic heart failure ([name]) Possibly source of his fatigue and low energy.  Recent echo with 40 to 50% EF.  Continue management per cardiology.  Fatigue due to exertion Likely multifactorial.  He does have some normocytic anemia which is likely the main driver at this point.  He is currently undergoing GI work-up for heme positive stool.  Will start iron supplementation as noted above.  Encouraged daily exercises.  Hyperlipidemia associated with type 2 diabetes mellitus ([name]) Continued atorvastatin 80 mg daily.  CAD S/P percutaneous coronary angioplasty Place referral to palliative care per family request.   Sleep Disordered Breathing Place referral for sleep study.     Subjective:  HPI:  Patient was seen a few months ago.  He has been experiencing weight loss and fatigue.  Recently found to have positive FOBT and was referred to GI.  They are planning on doing a upper endoscopy and colonoscopy within the next couple weeks.  States that his appetite has  been very low.  Also feels very lethargic.  Very seldomly leaves his apartment.  It is very difficult for him to move around the house.  Brother also notes that he has had some difficulty maintaining hygiene.  Patient is now wearing depends as he is unable to make it to the restroom most of the time.  Patient's brother has observed him with some breathing abnormalities while sleeping and they are interested in possible oxygen therapy.  Patient and his brother are both interested in possible palliative care referral to improve his quality of life.  They are focused on symptom management rather than increasing longevity.   His stable, chronic medical conditions are outlined below:  # T2DM  -Currently on Lantus 20 units in the morning and metformin 1000 mg twice daily. -Home sugar readings in 100-200s -ROS: No reported polyuria or polydipsia  # Dyslipidemia / CAD s/p stenting in 2015 -Currently on Lipitor 80 mg daily and tolerating without side effects. -ROS: No myalgias  # Essential Hypertension -Currently on spironolactone 25 mg daily, losartan 25 mg daily, and Coreg 3.125 mg twice daily.  Tolerating all these well without side effects. - ROS: No chest pain or shortness of breath.  # GERD - Currently on omeprazole 40mg  twice daily  # Low B12 - On replacement  ROS: Per HPI  PMH: He reports that he has never smoked. He has never used smokeless tobacco. He reports current alcohol use of about 1.0 standard drinks of alcohol per week. He reports that  he does not use drugs.      Objective:  Physical Exam: BP 122/74 (BP Location: Left Arm, Patient Position: Sitting, Cuff Size: Normal)   Pulse 72   Temp 97.8 F (36.6 C) (Oral)   Ht 5\' 8"  (1.727 m)   Wt 158 lb 6.4 oz (71.8 kg)   SpO2 97%   BMI 24.08 kg/m   Wt Readings from Last 3 Encounters:  03/29/19 158 lb 6.4 oz (71.8 kg)  03/14/19 161 lb (73 kg)  02/08/19 156 lb (70.8 kg)  Gen: NAD, resting comfortably CV: Regular rate and  rhythm with no murmurs appreciated Pulm: Normal work of breathing, clear to auscultation bilaterally with no crackles, wheezes, or rhonchi GI: Normal bowel sounds present. Soft, Nontender, Nondistended. Skin: Warm, dry Neuro: Grossly normal, moves all extremities Psych: Normal affect and thought content  Time Spent: I spent >40 minutes face-to-face with the patient, with more than half spent on coordinating care and counseling for management plan for his fatigue/lethargy, coronary artery disease, iron deficiency, type 2 diabetes, GERD, B12, and hypertension.      [name]. [name], MD 03/29/2019 12:08 PM

## 2019-03-30 ENCOUNTER — Telehealth: Payer: Self-pay

## 2019-03-30 NOTE — Telephone Encounter (Signed)
Copied from [place] [id number]. Topic: General - Inquiry >> Mar 30, 2019  1:19 PM Scherrie Gerlach wrote: Reason for CRM:  pt states his AVS stated continue B12 for another 6 wks.  Pt is confused, he thought he was going to go to monthly b12 inj. Please call and confirm which he should do.

## 2019-04-01 ENCOUNTER — Telehealth: Payer: Self-pay | Admitting: Family Medicine

## 2019-04-01 ENCOUNTER — Ambulatory Visit: Payer: PPO | Admitting: Gastroenterology

## 2019-04-01 NOTE — Telephone Encounter (Signed)
Jeffrey [name] referral Coordinator with [facility] is calling because she received a call from the patient's bother Jeffrey [name]. Requesting that the patient be placed on Patiative Care.   Jeffrey [name] is requesting if Dr. [name] would sign the orders.  Please advise CB- [phone] Option 2 [phone] -Fax

## 2019-04-01 NOTE — Telephone Encounter (Signed)
Resolved patient came into clinic.Was told to start monthly B 12 injections

## 2019-04-04 NOTE — Telephone Encounter (Signed)
Yes - I believe that order was placed last week but will be happy to sign any orders as needed.  [name]. [name], MD 04/04/2019 1:06 PM

## 2019-04-04 NOTE — Telephone Encounter (Signed)
Left voice message to call clinic 

## 2019-04-04 NOTE — Telephone Encounter (Signed)
Please advise 

## 2019-04-05 ENCOUNTER — Encounter: Payer: PPO | Admitting: Internal Medicine

## 2019-04-05 ENCOUNTER — Telehealth: Payer: Self-pay | Admitting: Internal Medicine

## 2019-04-05 NOTE — Telephone Encounter (Signed)
Spoke with brother Kenenna Kooiker and have scheduled a Telephone Palliative Consult for 04/07/19 @ 9 AM.

## 2019-04-06 ENCOUNTER — Telehealth: Payer: Self-pay | Admitting: Internal Medicine

## 2019-04-06 NOTE — Telephone Encounter (Signed)
Patient's brother Oziah Bobst called wanting to cancel the Palliative Consult.  He stated that he called patient's insurance company and found out that they do no cover aide services and he stated that was really what he was needing.  He said he needed an aide to come in 2-3 times a week to help with bathing for the patient and I explained to him that we did not provide aide services.  He did no want to pursue Palliative services at this time.

## 2019-04-07 ENCOUNTER — Other Ambulatory Visit: Payer: PPO | Admitting: Internal Medicine

## 2019-04-07 NOTE — Telephone Encounter (Signed)
Noted. We can try home health agencies again if he wishes but I think they will require a minimum number of hours.  [name]. [name], MD 04/07/2019 11:38 AM

## 2019-04-11 ENCOUNTER — Telehealth: Payer: Self-pay

## 2019-04-11 ENCOUNTER — Other Ambulatory Visit: Payer: Self-pay | Admitting: Family Medicine

## 2019-04-11 NOTE — Telephone Encounter (Signed)
Covid-19 screening questions   Do you now or have you had a fever in the last 14 days? NO   Do you have any respiratory symptoms of shortness of breath or cough now or in the last 14 days? NO  Do you have any family members or close contacts with diagnosed or suspected Covid-19 in the past 14 days? NO  Have you been tested for Covid-19 and found to be positive? NO        

## 2019-04-12 ENCOUNTER — Ambulatory Visit (AMBULATORY_SURGERY_CENTER): Payer: PPO | Admitting: Internal Medicine

## 2019-04-12 ENCOUNTER — Other Ambulatory Visit: Payer: Self-pay

## 2019-04-12 ENCOUNTER — Encounter: Payer: Self-pay | Admitting: Internal Medicine

## 2019-04-12 VITALS — BP 147/75 | HR 80 | Temp 98.2°F | Resp 16 | Ht 68.0 in | Wt 158.0 lb

## 2019-04-12 DIAGNOSIS — R634 Abnormal weight loss: Secondary | ICD-10-CM

## 2019-04-12 DIAGNOSIS — R195 Other fecal abnormalities: Secondary | ICD-10-CM | POA: Diagnosis not present

## 2019-04-12 DIAGNOSIS — K529 Noninfective gastroenteritis and colitis, unspecified: Secondary | ICD-10-CM | POA: Diagnosis not present

## 2019-04-12 DIAGNOSIS — D509 Iron deficiency anemia, unspecified: Secondary | ICD-10-CM

## 2019-04-12 DIAGNOSIS — K5289 Other specified noninfective gastroenteritis and colitis: Secondary | ICD-10-CM | POA: Diagnosis not present

## 2019-04-12 DIAGNOSIS — D649 Anemia, unspecified: Secondary | ICD-10-CM

## 2019-04-12 DIAGNOSIS — K5730 Diverticulosis of large intestine without perforation or abscess without bleeding: Secondary | ICD-10-CM

## 2019-04-12 MED ORDER — SODIUM CHLORIDE 0.9 % IV SOLN: 500.0000 mL | Freq: Once | INTRAVENOUS | Status: DC

## 2019-04-12 NOTE — Op Note (Signed)
[place] Patient Name: Jeffrey [name] Procedure Date: 04/12/2019 1:27 PM MRN: [id number] Endoscopist: Docia Chuck. Henrene Pastor , MD Age: 63 Referring MD:  Date of Birth: 04/13/1956 Gender: Male Account #: [id number] Procedure:                Colonoscopy with biopsies. Indications:              Clinically significant diarrhea of unexplained                            origin, Weight loss, normocytic anemia. Previous                            colonoscopies in [place] 2008 and 2015 with                            diverticulosis and hemorrhoids Medicines:                Monitored Anesthesia Care Procedure:                Pre-Anesthesia Assessment:                           - Prior to the procedure, a History and Physical                            was performed, and patient medications and                            allergies were reviewed. The patient's tolerance of                            previous anesthesia was also reviewed. The risks                            and benefits of the procedure and the sedation                            options and risks were discussed with the patient.                            All questions were answered, and informed consent                            was obtained. Prior Anticoagulants: The patient has                            taken no previous anticoagulant or antiplatelet                            agents. ASA Grade Assessment: II - A patient with                            mild systemic disease. After reviewing the risks  and benefits, the patient was deemed in                            satisfactory condition to undergo the procedure.                           After obtaining informed consent, the colonoscope                            was passed under direct vision. Throughout the                            procedure, the patient's blood pressure, pulse, and                            oxygen saturations were  monitored continuously. The                            Colonoscope was introduced through the anus and                            advanced to the the cecum, identified by                            appendiceal orifice and ileocecal valve. The                            terminal ileum, ileocecal valve, appendiceal                            orifice, and rectum were photographed. The quality                            of the bowel preparation was excellent. The                            colonoscopy was performed without difficulty. The                            patient tolerated the procedure well. The bowel                            preparation used was SUPREP via split dose                            instruction. Scope In: 1:42:43 PM Scope Out: 1:59:10 PM Scope Withdrawal Time: 0 hours 12 minutes 43 seconds  Total Procedure Duration: 0 hours 16 minutes 27 seconds  Findings:                 The terminal ileum appeared normal.                           Many small and large-mouthed diverticula were found  in the entire colon.                           The entire examined colon appeared otherwise normal                            on direct and retroflexion views. Biopsies for                            histology were taken with a cold forceps from the                            entire colon for evaluation of microscopic colitis. Complications:            No immediate complications. Estimated blood loss:                            None. Estimated Blood Loss:     Estimated blood loss: none. Impression:               - The examined portion of the ileum was normal.                           - Diverticulosis in the entire examined colon.                           - The entire examined colon is otherwise normal on                            direct and retroflexion views. Status post biopsies Recommendation:           - Repeat colonoscopy in 10 years for screening                             purposes.                           - Patient has a contact number available for                            emergencies. The signs and symptoms of potential                            delayed complications were discussed with the                            patient. Return to normal activities tomorrow.                            Written discharge instructions were provided to the                            patient.                           - Resume previous diet.                           -  Continue present medications.                           - Await pathology results.                           -EGD today. Please see report for findings and                            final recommendations Jeffrey [name] N. Henrene Pastor, MD 04/12/2019 2:16:24 PM This report has been signed electronically.

## 2019-04-12 NOTE — Op Note (Signed)
[place] Patient Name: Jeffrey [name] Procedure Date: 04/12/2019 1:26 PM MRN: [id number] Endoscopist: Docia Chuck. Henrene Pastor , MD Age: 63 Referring MD:  Date of Birth: 03/13/1956 Gender: Male Account #: [id number] Procedure:                Upper GI endoscopy with biopsies Indications:              Diarrhea, Weight loss, heme positive stool Medicines:                Monitored Anesthesia Care Procedure:                Pre-Anesthesia Assessment:                           - Prior to the procedure, a History and Physical                            was performed, and patient medications and                            allergies were reviewed. The patient's tolerance of                            previous anesthesia was also reviewed. The risks                            and benefits of the procedure and the sedation                            options and risks were discussed with the patient.                            All questions were answered, and informed consent                            was obtained. Prior Anticoagulants: The patient has                            taken no previous anticoagulant or antiplatelet                            agents. ASA Grade Assessment: II - A patient with                            mild systemic disease. After reviewing the risks                            and benefits, the patient was deemed in                            satisfactory condition to undergo the procedure.                           After obtaining informed consent, the endoscope was  passed under direct vision. Throughout the                            procedure, the patient's blood pressure, pulse, and                            oxygen saturations were monitored continuously. The                            Endoscope was introduced through the mouth, and                            advanced to the second part of duodenum. The upper                            GI  endoscopy was accomplished without difficulty.                            The patient tolerated the procedure well. Scope In: Scope Out: Findings:                 The esophagus was normal.                           The stomach was normal.                           The examined duodenum was normal. Biopsies for                            histology were taken with a cold forceps for                            evaluation of celiac disease.                           The cardia and gastric fundus were normal on                            retroflexion. Complications:            No immediate complications. Estimated Blood Loss:     Estimated blood loss: none. Impression:               - Normal esophagus.                           - Normal stomach.                           - Normal examined duodenum. Biopsied. Recommendation:           1. Resume previous diet and medications                           2. Follow-up biopsies  3. PLEASE SCHEDULE contrast-enhanced CT scan of the                            chest, abdomen, and pelvis "unexplained progressive                            weight loss, rule out malignancy". Docia Chuck. Henrene Pastor, MD 04/12/2019 2:20:17 PM This report has been signed electronically.

## 2019-04-12 NOTE — Progress Notes (Signed)
Pt Drowsy. VSS. To PACU, report to RN. No anesthetic complications noted.  

## 2019-04-12 NOTE — Patient Instructions (Signed)
Please read handouts provided. Continue present medications. Await pathology results.      YOU HAD AN ENDOSCOPIC PROCEDURE TODAY AT THE [facility]:   Refer to the procedure report that was given to you for any specific questions about what was found during the examination.  If the procedure report does not answer your questions, please call your gastroenterologist to clarify.  If you requested that your care partner not be given the details of your procedure findings, then the procedure report has been included in a sealed envelope for you to review at your convenience later.  YOU SHOULD EXPECT: Some feelings of bloating in the abdomen. Passage of more gas than usual.  Walking can help get rid of the air that was put into your GI tract during the procedure and reduce the bloating. If you had a lower endoscopy (such as a colonoscopy or flexible sigmoidoscopy) you may notice spotting of blood in your stool or on the toilet paper. If you underwent a bowel prep for your procedure, you may not have a normal bowel movement for a few days.  Please Note:  You might notice some irritation and congestion in your nose or some drainage.  This is from the oxygen used during your procedure.  There is no need for concern and it should clear up in a day or so.  SYMPTOMS TO REPORT IMMEDIATELY:   Following lower endoscopy (colonoscopy or flexible sigmoidoscopy):  Excessive amounts of blood in the stool  Significant tenderness or worsening of abdominal pains  Swelling of the abdomen that is new, acute  Fever of 100F or higher   Following upper endoscopy (EGD)  Vomiting of blood or coffee ground material  New chest pain or pain under the shoulder blades  Painful or persistently difficult swallowing  New shortness of breath  Fever of 100F or higher  Black, tarry-looking stools  For urgent or emergent issues, a gastroenterologist can be reached at any hour by calling [phone]  [phone].   DIET:  We do recommend a small meal at first, but then you may proceed to your regular diet.  Drink plenty of fluids but you should avoid alcoholic beverages for 24 hours.  ACTIVITY:  You should plan to take it easy for the rest of today and you should NOT DRIVE or use heavy machinery until tomorrow (because of the sedation medicines used during the test).    FOLLOW UP: Our staff will call the number listed on your records 48-72 hours following your procedure to check on you and address any questions or concerns that you may have regarding the information given to you following your procedure. If we do not reach you, we will leave a message.  We will attempt to reach you two times.  During this call, we will ask if you have developed any symptoms of COVID 19. If you develop any symptoms (ie: fever, flu-like symptoms, shortness of breath, cough etc.) before then, please call [phone].  If you test positive for Covid 19 in the 2 weeks post procedure, please call and report this information to us.    If any biopsies were taken you will be contacted by phone or by letter within the next 1-3 weeks.  Please call us at [phone] if you have not heard about the biopsies in 3 weeks.    SIGNATURES/CONFIDENTIALITY: You and/or your care partner have signed paperwork which will be entered into your electronic medical record.  These signatures attest to the fact   that that the information above on your After Visit Summary has been reviewed and is understood.  Full responsibility of the confidentiality of this discharge information lies with you and/or your care-partner. 

## 2019-04-12 NOTE — Progress Notes (Signed)
Called to room to assist during endoscopic procedure.  Patient ID and intended procedure confirmed with present staff. Received instructions for my participation in the procedure from the performing physician.  

## 2019-04-13 ENCOUNTER — Other Ambulatory Visit: Payer: Self-pay

## 2019-04-13 ENCOUNTER — Telehealth: Payer: Self-pay

## 2019-04-13 DIAGNOSIS — R634 Abnormal weight loss: Secondary | ICD-10-CM

## 2019-04-13 NOTE — Telephone Encounter (Signed)
Pt scheduled for CT of CAP at [facility] 04/21/19@9 :30am, pt to arrive there at 9:15am. Pt to be NPO after midnight, drink bottle 1 of contrast at 7:30am, bottle 2 at 8:30am. Pt to come for labs prior to CT appt, orders in epic.

## 2019-04-14 ENCOUNTER — Other Ambulatory Visit: Payer: Self-pay | Admitting: Family Medicine

## 2019-04-14 ENCOUNTER — Telehealth: Payer: Self-pay | Admitting: *Deleted

## 2019-04-14 ENCOUNTER — Other Ambulatory Visit (INDEPENDENT_AMBULATORY_CARE_PROVIDER_SITE_OTHER): Payer: PPO

## 2019-04-14 ENCOUNTER — Telehealth: Payer: Self-pay

## 2019-04-14 DIAGNOSIS — R634 Abnormal weight loss: Secondary | ICD-10-CM | POA: Diagnosis not present

## 2019-04-14 LAB — CREATININE, SERUM: Creatinine, Ser: 1.25 mg/dL (ref 0.40–1.50)

## 2019-04-14 LAB — BUN: BUN: 23 mg/dL (ref 6–23)

## 2019-04-14 MED ORDER — CARVEDILOL 3.125 MG PO TABS: 3.1250 mg | ORAL_TABLET | Freq: Two times a day (BID) | ORAL | 1 refills | Status: DC

## 2019-04-14 MED ORDER — LOSARTAN POTASSIUM 25 MG PO TABS: 25.0000 mg | ORAL_TABLET | Freq: Every day | ORAL | 1 refills | Status: DC

## 2019-04-14 NOTE — Telephone Encounter (Signed)
Requested medication (s) are due for refill today: yes  Requested medication (s) are on the active medication list: yes  Last refill:  10/14/2018  Future visit scheduled: yes  Notes to clinic: ordering provider and pcp are different    Requested Prescriptions  Pending Prescriptions Disp Refills   carvedilol (COREG) 3.125 MG tablet 180 tablet 1    Sig: Take 1 tablet (3.125 mg total) by mouth 2 (two) times daily with a meal.     Cardiovascular:  Beta Blockers Failed - 04/14/2019  1:44 PM      Failed - Last BP in normal range    BP Readings from Last 1 Encounters:  04/12/19 (!) 147/75         Passed - Last Heart Rate in normal range    Pulse Readings from Last 1 Encounters:  04/12/19 80         Passed - Valid encounter within last 6 months    Recent Outpatient Visits          2 weeks ago Hypertension associated with diabetes ([place])   [facility] Bread, [name], MD   2 months ago Pedal edema   [place] Parker, [place], MD   4 months ago Insulin dependent diabetes mellitus with complications [facility])   [facility], [name], MD   7 months ago Diabetic peripheral neuropathy associated with type 2 diabetes mellitus [facility])   [facility], [name], MD   10 months ago Insulin dependent diabetes mellitus with complications [facility])   [facility] Bread, [name], MD      Future Appointments            In 2 months [name], [name], MD [facility] [place]            losartan (COZAAR) 25 MG tablet 90 tablet 1    Sig: Take 1 tablet (25 mg total) by mouth daily.     Cardiovascular:  Angiotensin Receptor Blockers Failed - 04/14/2019  1:44 PM      Failed - Last BP in normal range    BP Readings from Last 1 Encounters:  04/12/19 (!) 147/75         Passed - Cr in normal range and within 180 days    Creat  Date Value Ref Range Status   04/25/2016 1.15 0.70 - 1.25 mg/dL Final    Comment:      For patients > or = 64 years of age: The upper reference limit for Creatinine is approximately 13% higher for people identified as African-American.      Creatinine, Ser  Date Value Ref Range Status  04/14/2019 1.25 0.40 - 1.50 mg/dL Final         Passed - K in normal range and within 180 days    Potassium  Date Value Ref Range Status  02/08/2019 4.7 3.5 - 5.1 mEq/L Final         Passed - Patient is not pregnant      Passed - Valid encounter within last 6 months    Recent Outpatient Visits          2 weeks ago Hypertension associated with diabetes [facility])   [facility] Bread, [name], MD   2 months ago Pedal edema   [place] Parker, [name], MD   4 months ago Insulin dependent diabetes mellitus with complications ([place])  [facility] [place], [name], MD   7 months ago Diabetic peripheral neuropathy associated with type 2 diabetes mellitus [facility])   [facility] Bread, [name], MD   10 months ago Insulin dependent diabetes mellitus with complications [facility])   [facility], [name], MD      Future Appointments            In 2 months [name], [name], MD [place]

## 2019-04-14 NOTE — Telephone Encounter (Signed)
  Follow up Call-  Call back number 04/12/2019  Post procedure Call Back phone  # [phone]  Permission to leave phone message Yes  Some recent data might be hidden     Patient questions:  Message left to call us if necessary. Second call.

## 2019-04-14 NOTE — Telephone Encounter (Signed)
First attempt follow up call to pt, LM on vm

## 2019-04-14 NOTE — Telephone Encounter (Signed)
Medication Refill - Medication: carvedilol (COREG) 3.125 MG tablet + losartan (COZAAR) 25 MG tablet  Summer from [facility] called to report that refill request was faxed over on Monday.   Has the patient contacted their pharmacy? Yes.   (Agent: If no, request that the patient contact the pharmacy for the refill.) (Agent: If yes, when and what did the pharmacy advise?)  Preferred Pharmacy (with phone number or street name):  [id number] [address], [place]  [place] [address]  Phone: [phone] Fax: [phone]     Agent: Please be advised that RX refills may take up to 3 business days. We ask that you follow-up with your pharmacy.

## 2019-04-14 NOTE — Telephone Encounter (Signed)
See note

## 2019-04-16 ENCOUNTER — Encounter: Payer: Self-pay | Admitting: Internal Medicine

## 2019-04-20 ENCOUNTER — Institutional Professional Consult (permissible substitution): Payer: BLUE CROSS/BLUE SHIELD | Admitting: Neurology

## 2019-04-21 ENCOUNTER — Other Ambulatory Visit: Payer: Self-pay

## 2019-04-21 ENCOUNTER — Ambulatory Visit (HOSPITAL_COMMUNITY)
Admission: RE | Admit: 2019-04-21 | Discharge: 2019-04-21 | Disposition: A | Discharge status: Home / Self Care | Payer: PPO | Source: Ambulatory Visit | Attending: Internal Medicine | Admitting: Internal Medicine

## 2019-04-21 DIAGNOSIS — K449 Diaphragmatic hernia without obstruction or gangrene: Secondary | ICD-10-CM | POA: Diagnosis not present

## 2019-04-21 DIAGNOSIS — R911 Solitary pulmonary nodule: Secondary | ICD-10-CM | POA: Insufficient documentation

## 2019-04-21 DIAGNOSIS — I2510 Atherosclerotic heart disease of native coronary artery without angina pectoris: Secondary | ICD-10-CM | POA: Insufficient documentation

## 2019-04-21 DIAGNOSIS — I700 Atherosclerosis of aorta: Secondary | ICD-10-CM | POA: Insufficient documentation

## 2019-04-21 DIAGNOSIS — R5383 Other fatigue: Secondary | ICD-10-CM | POA: Insufficient documentation

## 2019-04-21 DIAGNOSIS — K5730 Diverticulosis of large intestine without perforation or abscess without bleeding: Secondary | ICD-10-CM | POA: Insufficient documentation

## 2019-04-21 DIAGNOSIS — N400 Enlarged prostate without lower urinary tract symptoms: Secondary | ICD-10-CM | POA: Insufficient documentation

## 2019-04-21 DIAGNOSIS — R918 Other nonspecific abnormal finding of lung field: Secondary | ICD-10-CM | POA: Diagnosis not present

## 2019-04-21 DIAGNOSIS — R634 Abnormal weight loss: Secondary | ICD-10-CM

## 2019-04-21 DIAGNOSIS — Z6824 Body mass index (BMI) 24.0-24.9, adult: Secondary | ICD-10-CM | POA: Insufficient documentation

## 2019-04-21 MED ORDER — SODIUM CHLORIDE (PF) 0.9 % IJ SOLN
INTRAMUSCULAR | Status: AC
  Filled 2019-04-21: qty 50

## 2019-04-21 MED ORDER — IOHEXOL 300 MG/ML  SOLN
100.0000 mL | Freq: Once | INTRAMUSCULAR | Status: AC | PRN
  Administered 2019-04-21: 100 mL via INTRAVENOUS

## 2019-04-28 ENCOUNTER — Ambulatory Visit (INDEPENDENT_AMBULATORY_CARE_PROVIDER_SITE_OTHER): Payer: PPO

## 2019-04-28 DIAGNOSIS — E538 Deficiency of other specified B group vitamins: Secondary | ICD-10-CM

## 2019-04-28 MED ORDER — CYANOCOBALAMIN 1000 MCG/ML IJ SOLN
1000.0000 ug | Freq: Once | INTRAMUSCULAR | Status: AC
  Administered 2019-04-28: 1000 ug via INTRAMUSCULAR

## 2019-04-28 NOTE — Progress Notes (Signed)
Per orders of Dr. Parker, injection of B-12 given by Joellen Y Thompson in right deltoid. Patient tolerated injection well. Patient will make appointment for 1 month.  

## 2019-05-10 ENCOUNTER — Other Ambulatory Visit: Payer: Self-pay | Admitting: Family Medicine

## 2019-05-17 ENCOUNTER — Telehealth: Payer: Self-pay | Admitting: Family Medicine

## 2019-05-17 NOTE — Telephone Encounter (Signed)
I left a message asking the patient to call and schedule Medicare AWV with [name] ([place]) on 05/26/2019.  Im waiting for a call back to either confirm or decline the appointment. If patient calls back, please update appointment notes.  Jeffrey [name] ([place])

## 2019-05-18 ENCOUNTER — Emergency Department (HOSPITAL_COMMUNITY): Payer: PPO

## 2019-05-18 ENCOUNTER — Emergency Department (HOSPITAL_COMMUNITY)
Admission: EM | Admit: 2019-05-18 | Discharge: 2019-05-18 | Disposition: A | Discharge status: Home / Self Care | Payer: PPO | Attending: Emergency Medicine | Admitting: Emergency Medicine

## 2019-05-18 ENCOUNTER — Other Ambulatory Visit: Payer: Self-pay | Admitting: Family Medicine

## 2019-05-18 ENCOUNTER — Encounter (HOSPITAL_COMMUNITY): Payer: Self-pay

## 2019-05-18 DIAGNOSIS — R197 Diarrhea, unspecified: Secondary | ICD-10-CM | POA: Diagnosis not present

## 2019-05-18 DIAGNOSIS — E119 Type 2 diabetes mellitus without complications: Secondary | ICD-10-CM | POA: Insufficient documentation

## 2019-05-18 DIAGNOSIS — I110 Hypertensive heart disease with heart failure: Secondary | ICD-10-CM | POA: Diagnosis not present

## 2019-05-18 DIAGNOSIS — Z7982 Long term (current) use of aspirin: Secondary | ICD-10-CM | POA: Diagnosis not present

## 2019-05-18 DIAGNOSIS — I509 Heart failure, unspecified: Secondary | ICD-10-CM | POA: Diagnosis not present

## 2019-05-18 DIAGNOSIS — R55 Syncope and collapse: Secondary | ICD-10-CM

## 2019-05-18 DIAGNOSIS — Z79899 Other long term (current) drug therapy: Secondary | ICD-10-CM | POA: Insufficient documentation

## 2019-05-18 DIAGNOSIS — R531 Weakness: Secondary | ICD-10-CM | POA: Diagnosis not present

## 2019-05-18 DIAGNOSIS — R4020 Unspecified coma: Secondary | ICD-10-CM | POA: Diagnosis not present

## 2019-05-18 DIAGNOSIS — I2510 Atherosclerotic heart disease of native coronary artery without angina pectoris: Secondary | ICD-10-CM | POA: Diagnosis not present

## 2019-05-18 DIAGNOSIS — Z794 Long term (current) use of insulin: Secondary | ICD-10-CM | POA: Insufficient documentation

## 2019-05-18 LAB — CBC WITH DIFFERENTIAL/PLATELET
Abs Immature Granulocytes: 0.05 10*3/uL (ref 0.00–0.07)
Basophils Absolute: 0.1 10*3/uL (ref 0.0–0.1)
Basophils Relative: 0 %
Eosinophils Absolute: 0.2 10*3/uL (ref 0.0–0.5)
Eosinophils Relative: 2 %
HCT: 33.4 % — ABNORMAL LOW (ref 39.0–52.0)
Hemoglobin: 11.4 g/dL — ABNORMAL LOW (ref 13.0–17.0)
Immature Granulocytes: 0 %
Lymphocytes Relative: 18 %
Lymphs Abs: 2.3 10*3/uL (ref 0.7–4.0)
MCH: 31.9 pg (ref 26.0–34.0)
MCHC: 34.1 g/dL (ref 30.0–36.0)
MCV: 93.6 fL (ref 80.0–100.0)
Monocytes Absolute: 1 10*3/uL (ref 0.1–1.0)
Monocytes Relative: 8 %
Neutro Abs: 9.1 10*3/uL — ABNORMAL HIGH (ref 1.7–7.7)
Neutrophils Relative %: 72 %
Platelets: 359 10*3/uL (ref 150–400)
RBC: 3.57 MIL/uL — ABNORMAL LOW (ref 4.22–5.81)
RDW: 12.5 % (ref 11.5–15.5)
WBC: 12.7 10*3/uL — ABNORMAL HIGH (ref 4.0–10.5)
nRBC: 0 % (ref 0.0–0.2)

## 2019-05-18 LAB — BASIC METABOLIC PANEL
Anion gap: 10 (ref 5–15)
BUN: 35 mg/dL — ABNORMAL HIGH (ref 8–23)
CO2: 21 mmol/L — ABNORMAL LOW (ref 22–32)
Calcium: 9 mg/dL (ref 8.9–10.3)
Chloride: 108 mmol/L (ref 98–111)
Creatinine, Ser: 1.49 mg/dL — ABNORMAL HIGH (ref 0.61–1.24)
GFR calc Af Amer: 57 mL/min — ABNORMAL LOW (ref >60)
GFR calc non Af Amer: 49 mL/min — ABNORMAL LOW (ref >60)
Glucose, Bld: 110 mg/dL — ABNORMAL HIGH (ref 70–99)
Potassium: 3.8 mmol/L (ref 3.5–5.1)
Sodium: 139 mmol/L (ref 135–145)

## 2019-05-18 LAB — TROPONIN I (HIGH SENSITIVITY): Troponin I (High Sensitivity): 6 ng/L (ref <18)

## 2019-05-18 MED ORDER — SODIUM CHLORIDE 0.9 % IV BOLUS
1000.0000 mL | Freq: Once | INTRAVENOUS | Status: AC
  Administered 2019-05-18: 1000 mL via INTRAVENOUS

## 2019-05-18 NOTE — Discharge Instructions (Signed)
Please get a follow-up appointment with your primary doctor as well as her gastroenterologist.  If you have any further episodes of passing out, develop any chest pain, difficulty breathing or other new concerns and recommend return to the ER for reassessment.

## 2019-05-18 NOTE — ED Notes (Addendum)
Got patient undress on the monitor did ekg shown to er doctor patient is resting with nurse at bedside

## 2019-05-18 NOTE — ED Triage Notes (Signed)
Pt from home via ems; neighbors found him outside of house unresponsive after apparent syncopal episode; difficult to arouse; back inside on chair on ems arrival; c/o increased weakness, particularly in bilateral legs; pt also c/o diarrhea, lost 30 lbs in 4 months without trying; increase in urination; evaluated by several doctors; plan to have PET scan, still needs to be scheduled; denies vomiting; hx DM, stent placement; denies sick contacts; denies pain  136/68 (250cc NS PTA) HR 80 RR 18 98% RA CBG 123 97.37F

## 2019-05-18 NOTE — ED Provider Notes (Signed)
[facility] EMERGENCY DEPARTMENT Provider Note   CSN: [id number] Arrival date & time: 05/18/19  1324     History   Chief Complaint Chief Complaint  Patient presents with  . Loss of Consciousness    HPI Kemper Heupel is a 63 y.o. male. Presented to the ER after syncopal episode. Patient reports he was outside when he felt lightheaded and then passed out. Denies any head trauma. Reports this was witnessed by neighbor who helped him inside after episode. Patient unsure duration of LOC. Denies bladder or bowel incontinence, no tongue biting. States neighbors did not report any seizure like activity. He denied any associated chest pain or shortness of breath associated with episode. Currently has no symptoms, specifically denying any numbness, weakness, vision changes, speech changes, chest pain, shortness of breath, abdominal pain.   Patient reports his primary concern is generalized weakness and weight loss he has experienced over the past few months. He has also had loose stools, nonbloody. He denies any acute changes in these symptoms and states he is being followed closely by his primary doctor and has been evaluted by GI and undergone stool testing as well as endoscopy and colonsocopy with no answers. Additionally has discussed getting PET scan with PCP.  PMH - CAD, MI in 2015 treated in [place], recent echo/stress in 2019 - EF 45-50%, trivial AR, myocardial perfusion study low risk.    States he has an appointment with his primary doctor tomorrow morning.      HPI  Past Medical History:  Diagnosis Date  . Cardiac arrest ([place]) 05/22/2016  . CHF (congestive heart failure) ([place])   . Diabetes mellitus without complication ([place])   . Diabetic retinopathy ([place])   . Hyperlipidemia   . Hypertension   . Myocardial infarct ([place]) 2105  . S/P primary angioplasty with coronary stent 05/22/2016  . Vitamin B12 deficiency     Patient Active Problem List   Diagnosis Date  Noted  . Low vitamin B12 level 03/29/2019  . Heme positive stool 03/14/2019  . Normocytic anemia 03/14/2019  . Chronic combined systolic and diastolic heart failure ([place]) 02/08/2019  . Diabetic retinopathy ([place]) 09/08/2018  . Physical debility 05/24/2018  . Varicose veins of both lower extremities 03/01/2018  . Fatigue due to exertion 10/14/2017  . GERD (gastroesophageal reflux disease) 08/25/2017  . CAD S/P percutaneous coronary angioplasty 04/14/2017  . Hyperlipidemia associated with type 2 diabetes mellitus ([place]) 04/14/2017  . Diabetic peripheral neuropathy associated with type 2 diabetes mellitus ([place]) 08/17/2016  . T2DM (type 2 diabetes mellitus) ([place]) 05/02/2016  . Hypertension associated with diabetes ([place]) 05/02/2016    Past Surgical History:  Procedure Laterality Date  . ANGIOPLASTY     2015  . REFRACTIVE SURGERY    . VITRECTOMY          Home Medications    Prior to Admission medications   Medication Sig Start Date End Date Taking? Authorizing Provider  aspirin EC 81 MG tablet Take 81 mg by mouth daily.    [provider]  atorvastatin (LIPITOR) 80 MG tablet TAKE 1 TABLET BY MOUTH EVERY DAY 05/18/19   Vivi Barrack, MD  Blood Glucose Monitoring Suppl (ONETOUCH VERIO IQ SYSTEM) w/Device KIT 1 kit by Does not apply route 4 (four) times daily. 03/11/18   Vivi Barrack, MD  carvedilol (COREG) 3.125 MG tablet Take 1 tablet (3.125 mg total) by mouth 2 (two) times daily with a meal. 04/14/19   Vivi Barrack, MD  furosemide (LASIX) 40 MG tablet TAKE 1 TABLET BY MOUTH EVERY DAY 05/10/19   Vivi Barrack, MD  glucose blood test strip Check blood sugar 4 times per day 03/18/19   Vivi Barrack, MD  insulin glargine (LANTUS) 100 UNIT/ML injection Inject 15-28 units twice daily. 03/01/18   Vivi Barrack, MD  Insulin Syringe-Needle U-100 (B-D INS SYR HALF-UNIT .3CC/31G) 31G X 5/16" 0.3 ML MISC Use 6 times a day 09/07/17   Vivi Barrack, MD  losartan (COZAAR) 25 MG tablet  Take 1 tablet (25 mg total) by mouth daily. 04/14/19   Vivi Barrack, MD  metFORMIN (GLUCOPHAGE-XR) 500 MG 24 hr tablet TAKE 2 TABLETS BY MOUTH TWICE A DAY 01/10/19   Vivi Barrack, MD  OneTouch Delica Lancets 33L MISC Use to check blood sugar 4 times per day 03/18/19   Vivi Barrack, MD  Semaglutide (RYBELSUS) 3 MG TABS Take 3 mg by mouth daily. 05/19/19   Vivi Barrack, MD  spironolactone (ALDACTONE) 25 MG tablet TAKE 1 TABLET BY MOUTH EVERY DAY 04/11/19   Vivi Barrack, MD    Family History Family History  Problem Relation Age of Onset  . Alzheimer's disease Mother   . Heart disease Father   . Peripheral Artery Disease Father   . Heart disease Brother   . Colon polyps Neg Hx   . Prostate cancer Neg Hx   . Rectal cancer Neg Hx   . Esophageal cancer Neg Hx     Social History Social History   Tobacco Use  . Smoking status: Never Smoker  . Smokeless tobacco: Never Used  Substance Use Topics  . Alcohol use: Not Currently    Comment: occ  . Drug use: No     Allergies   Codeine and Penicillins   Review of Systems Review of Systems  Constitutional: Positive for fatigue. Negative for chills and fever.  HENT: Negative for ear pain and sore throat.   Eyes: Negative for pain and visual disturbance.  Respiratory: Negative for cough and shortness of breath.   Cardiovascular: Negative for chest pain and palpitations.  Gastrointestinal: Negative for abdominal pain and vomiting.  Genitourinary: Negative for dysuria and hematuria.  Musculoskeletal: Negative for arthralgias and back pain.  Skin: Negative for color change and rash.  Neurological: Positive for headaches. Negative for seizures and syncope.  All other systems reviewed and are negative.    Physical Exam Updated Vital Signs BP 126/85 (BP Location: Right Arm)   Pulse 80   Temp (!) 97.4 F (36.3 C) (Oral)   Resp 13   SpO2 99%   Physical Exam Vitals signs and nursing note reviewed.  Constitutional:       Appearance: He is well-developed.  HENT:     Head: Normocephalic and atraumatic.  Eyes:     Conjunctiva/sclera: Conjunctivae normal.  Neck:     Musculoskeletal: Neck supple.  Cardiovascular:     Rate and Rhythm: Normal rate and regular rhythm.     Heart sounds: No murmur.  Pulmonary:     Effort: Pulmonary effort is normal. No respiratory distress.     Breath sounds: Normal breath sounds.  Abdominal:     Palpations: Abdomen is soft.     Tenderness: There is no abdominal tenderness.  Musculoskeletal:        General: No swelling or tenderness.  Skin:    General: Skin is warm and dry.     Capillary Refill: Capillary refill takes less than 2  seconds.  Neurological:     Mental Status: He is alert.     Comments: AAOX3, CN 2-12, 5/5 strength in b/l UE and LE, sensation intact in all four extremities, normal FNF      ED Treatments / Results  Labs (all labs ordered are listed, but only abnormal results are displayed) Labs Reviewed  CBC WITH DIFFERENTIAL/PLATELET - Abnormal; Notable for the following components:      Result Value   WBC 12.7 (*)    RBC 3.57 (*)    Hemoglobin 11.4 (*)    HCT 33.4 (*)    Neutro Abs 9.1 (*)    All other components within normal limits  BASIC METABOLIC PANEL - Abnormal; Notable for the following components:   CO2 21 (*)    Glucose, Bld 110 (*)    BUN 35 (*)    Creatinine, Ser 1.49 (*)    GFR calc non Af Amer 49 (*)    GFR calc Af Amer 57 (*)    All other components within normal limits  TROPONIN I (HIGH SENSITIVITY)    EKG EKG Interpretation  Date/Time:  Wednesday May 18 2019 13:28:51 EDT Ventricular Rate:  81 PR Interval:    QRS Duration: 93 QT Interval:  367 QTC Calculation: 426 R Axis:   -39 Text Interpretation:  Normal sinus rhythm Consider inferior infarct Anterior infarct, old No acute STEMI Reconfirmed by Madalyn Rob [phone]) on 05/19/2019 9:14:25 AM   Radiology Dg Chest 2 View  Result Date: 05/18/2019 CLINICAL DATA:   Weakness and syncope. EXAM: CHEST - 2 VIEW COMPARISON:  None. FINDINGS: 1354 hours. The lungs are clear without focal pneumonia, edema, pneumothorax or pleural effusion. The cardiopericardial silhouette is within normal limits for size. The visualized bony structures of the thorax are intact. Telemetry leads overlie the chest. IMPRESSION: No active cardiopulmonary disease. Electronically Signed   By: Misty Stanley M.D.   On: 05/18/2019 14:30    Procedures Procedures (including critical care time)  Medications Ordered in ED Medications  sodium chloride 0.9 % bolus 1,000 mL (0 mLs Intravenous Stopped 05/18/19 1633)     Initial Impression / Assessment and Plan / ED Course  I have reviewed the triage vital signs and the nursing notes.  Pertinent labs & imaging results that were available during my care of the patient were reviewed by me and considered in my medical decision making (see chart for details).  Clinical Course as of May 18 913  Wed May 18, 2019  1542 Reassessed, patient remains asymptomatic, will discharge home   [RD]  1547 Updated patient   [RD]    Clinical Course User Index [RD] Lucrezia Starch, MD       63 y/o male with PMH CAD, HF presents after syncopal episode. Denied associated trauma, no trauma noted on my exam. No seizure activity, normal neurologic exam, no HA or neuro complaints.  Overall well appearing, no ongoing symptoms. EKG without ischemic change, no arrhythmias on tele monitoring. No evidence for acute heart failure on CXR.  Labs with slight elevation in creatinine which may suggest dehydration. Provided patient fluids. At this time, I believe he is appropriate for management as outpatient and has very close follow up with PCP tomorrow.  Patient had significant concern about weight loss over the past few months and generalized fatigue - no acute changes in these concerns and is being actively followed by PCP. I reviewed return precautions in detail with  patient and his brother who is at  bedside.   After the discussed management above, the patient was determined to be safe for discharge.  The patient was in agreement with this plan and all questions regarding their care were answered.  ED return precautions were discussed and the patient will return to the ED with any significant worsening of condition.   Final Clinical Impressions(s) / ED Diagnoses   Final diagnoses:  Syncope, unspecified syncope type    ED Discharge Orders    None       Lucrezia Starch, MD 05/19/19 [phone]

## 2019-05-19 ENCOUNTER — Encounter: Payer: Self-pay | Admitting: Family Medicine

## 2019-05-19 ENCOUNTER — Ambulatory Visit (INDEPENDENT_AMBULATORY_CARE_PROVIDER_SITE_OTHER): Payer: PPO | Admitting: Family Medicine

## 2019-05-19 ENCOUNTER — Other Ambulatory Visit: Payer: Self-pay

## 2019-05-19 ENCOUNTER — Ambulatory Visit: Payer: PPO | Admitting: Family Medicine

## 2019-05-19 VITALS — BP 126/70 | HR 74 | Temp 97.3°F | Ht 68.0 in | Wt 159.0 lb

## 2019-05-19 DIAGNOSIS — Z794 Long term (current) use of insulin: Secondary | ICD-10-CM

## 2019-05-19 DIAGNOSIS — Z9861 Coronary angioplasty status: Secondary | ICD-10-CM | POA: Diagnosis not present

## 2019-05-19 DIAGNOSIS — R634 Abnormal weight loss: Secondary | ICD-10-CM | POA: Diagnosis not present

## 2019-05-19 DIAGNOSIS — I5042 Chronic combined systolic (congestive) and diastolic (congestive) heart failure: Secondary | ICD-10-CM | POA: Diagnosis not present

## 2019-05-19 DIAGNOSIS — R55 Syncope and collapse: Secondary | ICD-10-CM | POA: Diagnosis not present

## 2019-05-19 DIAGNOSIS — I110 Hypertensive heart disease with heart failure: Secondary | ICD-10-CM | POA: Diagnosis not present

## 2019-05-19 DIAGNOSIS — E1159 Type 2 diabetes mellitus with other circulatory complications: Secondary | ICD-10-CM

## 2019-05-19 DIAGNOSIS — E11319 Type 2 diabetes mellitus with unspecified diabetic retinopathy without macular edema: Secondary | ICD-10-CM | POA: Diagnosis not present

## 2019-05-19 DIAGNOSIS — E1169 Type 2 diabetes mellitus with other specified complication: Secondary | ICD-10-CM

## 2019-05-19 DIAGNOSIS — I152 Hypertension secondary to endocrine disorders: Secondary | ICD-10-CM

## 2019-05-19 DIAGNOSIS — E785 Hyperlipidemia, unspecified: Secondary | ICD-10-CM | POA: Diagnosis not present

## 2019-05-19 DIAGNOSIS — E1142 Type 2 diabetes mellitus with diabetic polyneuropathy: Secondary | ICD-10-CM

## 2019-05-19 DIAGNOSIS — I2510 Atherosclerotic heart disease of native coronary artery without angina pectoris: Secondary | ICD-10-CM

## 2019-05-19 LAB — POCT GLYCOSYLATED HEMOGLOBIN (HGB A1C): Hemoglobin A1C: 8.8 % — AB (ref 4.0–5.6)

## 2019-05-19 MED ORDER — METFORMIN HCL ER 500 MG PO TB24: 1000.0000 mg | ORAL_TABLET | Freq: Two times a day (BID) | ORAL | 3 refills | Status: DC

## 2019-05-19 MED ORDER — RYBELSUS 3 MG PO TABS: 3.0000 mg | ORAL_TABLET | Freq: Every day | ORAL | 5 refills | Status: DC

## 2019-05-19 NOTE — Progress Notes (Signed)
Chief Complaint:  Jeffrey [name] is a 63 y.o. male who presents today with a chief complaint of ED follow up for syncope.   Assessment/Plan:  Syncope Negative work-up in the ED.  Thought to be mostly due to dehydration.  Given cardiac history, I recommended pursuing further work-up at this time including echocardiogram, however patient declined.  Advised close follow-up with his cardiologist.  Discussed reasons to return to care and seek emergent care.  Hypertension associated with diabetes ([place]) At goal.  Continue spironolactone 25 mg daily, losartan 25 mg daily, and Coreg 3.125 mg twice daily.  [name] A1c 8.8.  Will start Rybelsus 3 mg daily.  Will slowly titrate due to history of diabetic retinopathy.  He will continue Lantus 20 units daily and metformin 1000 mg twice daily.  He will follow-up me in 3 months.  Chronic combined systolic and diastolic heart failure ([place]) Recommended getting echocardiogram today given recent syncopal episode.  He declined for the time being.  Diabetic retinopathy ([place]) Continue management per ophthalmology.  Hyperlipidemia associated with type 2 diabetes mellitus ([name]) Continue atorvastatin 80 mg daily.  CAD S/P percutaneous coronary angioplasty Has cardiology follow-up next month.  Continue aspirin and statin.  Unintentional weight loss with loose stools Weight has stabilized.  Is actually up a few pounds over the last month or so.  It is very possible his symptoms could have been due to metformin, especially given his reported loose stools.  Discussed potentially decreasing dose of metformin or stopping completely however he declined.  We will continue with watchful waiting.  We will follow-up in 3 months.      Subjective:  HPI:  Patient presented to the ED yesterday after being found outside of his house unresponsive after syncopal episode.  He reports that he was in his backyard in his garden and started  feeling a little dizzy. Went to sit down and then lost consciousness. He was reportedly difficult to arouse after losing consciousness.  He eventually regained consciousness and then went inside of his house. EMS was called and he was taken to the ED.  Labs in the ED yesterday were significant for elevated BUN and creatinine.  Hemoglobin was stable at 11.4.  Denies any chest pain or shortness of breath during the episode.  No palpitations.  He has never had anything like this in the past.  Thinks that he was a little dehydrated due to the warmer weather yesterday.  His stable, chronic medical conditions are outlined below:  # T2DM  -Currently on Lantus 20 units in the morning and metformin 1000 mg twice daily. -Home sugar readings in 100s -ROS: No reported polyuria or polydipsia  # Dyslipidemia / CAD s/p stenting in 2015 -Currently on Lipitor 80 mg daily and tolerating without side effects. -ROS: No myalgias  # Essential Hypertension -Currently on spironolactone 25 mg daily, losartan 25 mg daily, and Coreg 3.125 mg twice daily.  Tolerating all these well without side effects. - ROS: No chest pain or shortness of breath.  # GERD - Currently on omeprazole 40mg  twice daily  # Low B12 - On replacement  % Diabetic Retinopathy -Follows with ophthalmology every 3 months - Dr Baird Cancer      ROS: Per HPI  PMH: He reports that he has never smoked. He has never used smokeless tobacco. He reports previous alcohol use. He reports that he does not use drugs.      Objective:  Physical Exam: BP 126/70  Pulse 74   Temp (!) 97.3 F (36.3 C)   Ht 5\' 8"  (1.727 m)   Wt 159 lb (72.1 kg)   SpO2 97%   BMI 24.18 kg/m  Wt Readings from Last 3 Encounters:  05/19/19 159 lb (72.1 kg)  04/12/19 158 lb (71.7 kg)  03/29/19 158 lb 6.4 oz (71.8 kg)  Gen: NAD, resting comfortably CV: Regular rate and rhythm with no murmurs appreciated Pulm: Normal work of breathing, clear to auscultation bilaterally  with no crackles, wheezes, or rhonchi MSK: 1+ pretibial edema bilaterally Skin: Warm, dry Neuro: Grossly normal, moves all extremities Psych: Normal affect and thought content  Time Spent: I spent >40 minutes face-to-face with the patient, with more than half spent on counseling for syncope, T2DM, HTN, diabetic retinopathy, GERD.       [name]. [name], MD 05/19/2019 9:25 AM

## 2019-05-19 NOTE — Assessment & Plan Note (Signed)
Recommended getting echocardiogram today given recent syncopal episode.  He declined for the time being.

## 2019-05-19 NOTE — Assessment & Plan Note (Addendum)
A1c 8.8.  Will start Rybelsus 3 mg daily.  Will slowly titrate due to history of diabetic retinopathy.  He will continue Lantus 20 units daily and metformin 1000 mg twice daily.  He will follow-up me in 3 months.

## 2019-05-19 NOTE — Assessment & Plan Note (Signed)
Continue management per ophthalmology. 

## 2019-05-19 NOTE — Assessment & Plan Note (Signed)
At goal.  Continue spironolactone 25 mg daily, losartan 25 mg daily, and Coreg 3.125 mg twice daily.

## 2019-05-19 NOTE — Assessment & Plan Note (Signed)
Weight has stabilized.  Is actually up a few pounds over the last month or so.  It is very possible his symptoms could have been due to metformin, especially given his reported loose stools.  Discussed potentially decreasing dose of metformin or stopping completely however he declined.  We will continue with watchful waiting.  We will follow-up in 3 months.

## 2019-05-19 NOTE — Assessment & Plan Note (Signed)
Continue atorvastatin 80 mg daily. 

## 2019-05-19 NOTE — Patient Instructions (Signed)
It was very nice to see you today!  Your A1c was 8.8.   Please start the rybelsus.   Please let me know if you change your mind about the echocardiogram.  Come back to see me in 3 months, or sooner if needed.   Take care, Dr [name]  Please try these tips to maintain a healthy lifestyle:   Eat at least 3 REAL meals and 1-2 snacks per day.  Aim for no more than 5 hours between eating.  If you eat breakfast, please do so within one hour of getting up.    Obtain twice as many fruits/vegetables as protein or carbohydrate foods for both lunch and dinner. (Half of each meal should be fruits/vegetables, one quarter protein, and one quarter starchy carbs)   Cut down on sweet beverages. This includes juice, soda, and sweet tea.    Exercise at least 150 minutes every week.

## 2019-05-19 NOTE — Assessment & Plan Note (Signed)
Has cardiology follow-up next month.  Continue aspirin and statin.

## 2019-05-20 ENCOUNTER — Other Ambulatory Visit: Payer: Self-pay

## 2019-05-20 MED ORDER — METFORMIN HCL ER 500 MG PO TB24: 1000.0000 mg | ORAL_TABLET | Freq: Two times a day (BID) | ORAL | 3 refills | Status: DC

## 2019-05-24 ENCOUNTER — Other Ambulatory Visit: Payer: Self-pay

## 2019-05-24 NOTE — Patient Outreach (Addendum)
[facility]) Care Management  05/24/2019  Jeffrey [name] December 07, 1955 [id number]   Social work referral received from [facility] to contact patient regarding need for in-home aide services.   Successful outreach to patient today.  Patient confirmed details of referral.  Seeking aide services but every agency that he has contacted requires a minimum of four hours per visit.  Patient stated that he is seeking 2 hours, 2-3 days per week. Patient reports being independent with most ADL's and having assistance from brother that lives in the home.  He mostly needs assistance with showering several days per week.   Offered several times to contact more agencies on patient's behalf to determine availability for hours he is seeking.  Patient declined assistance at this time stating that he knows other individuals in the community that receive aide services and he intends to talk with those aides about his needs.  Closing referral at this time but patient did state that he would call if additional needs arise.    Ronn Melena, BSW Social Worker [phone]

## 2019-05-26 ENCOUNTER — Ambulatory Visit (INDEPENDENT_AMBULATORY_CARE_PROVIDER_SITE_OTHER): Payer: PPO

## 2019-05-26 ENCOUNTER — Ambulatory Visit: Payer: PPO

## 2019-05-26 ENCOUNTER — Other Ambulatory Visit: Payer: Self-pay

## 2019-05-26 DIAGNOSIS — E538 Deficiency of other specified B group vitamins: Secondary | ICD-10-CM

## 2019-05-26 MED ORDER — CYANOCOBALAMIN 1000 MCG/ML IJ SOLN
1000.0000 ug | Freq: Once | INTRAMUSCULAR | Status: AC
  Administered 2019-05-26: 1000 ug via INTRAMUSCULAR

## 2019-05-26 NOTE — Progress Notes (Signed)
Per orders of Dr.Parker , injection of B12  Given in left deltoid.  by Sandford Craze. Patient tolerated injection well. Pt will return in 1 month for next B12.

## 2019-06-06 NOTE — Progress Notes (Signed)
I have reviewed the patient's encounter and agree with the documentation.  Jeffrey [name]. [name], MD 06/06/2019 4:25 PM

## 2019-06-27 ENCOUNTER — Ambulatory Visit (INDEPENDENT_AMBULATORY_CARE_PROVIDER_SITE_OTHER): Payer: PPO

## 2019-06-27 ENCOUNTER — Other Ambulatory Visit: Payer: Self-pay

## 2019-06-27 DIAGNOSIS — E538 Deficiency of other specified B group vitamins: Secondary | ICD-10-CM

## 2019-06-27 MED ORDER — CYANOCOBALAMIN 1000 MCG/ML IJ SOLN
1000.0000 ug | Freq: Once | INTRAMUSCULAR | Status: AC
  Administered 2019-06-27: 1000 ug via INTRAMUSCULAR

## 2019-06-27 NOTE — Progress Notes (Signed)
Per orders of Dr. [name], injection of B-12 given by Francella Solian in right deltoid. Patient tolerated injection well. Patient will make appointment for 1 month. Patient was not sure if he needed another B-12. Recommended to make follow up with Dr. [name].

## 2019-06-29 ENCOUNTER — Ambulatory Visit: Payer: PPO | Admitting: Family Medicine

## 2019-06-30 ENCOUNTER — Telehealth: Payer: Self-pay | Admitting: Family Medicine

## 2019-06-30 NOTE — Telephone Encounter (Signed)
Please ask pt to come in to have B12 level drawn.  [name]. [name], MD 06/30/2019 4:09 PM

## 2019-06-30 NOTE — Telephone Encounter (Signed)
Patient is calling to ask Dr. [name] is B-12 is needed for December. Patient states that he has waited since Tuesday for a response. Patient is wanting a response.  [id number].

## 2019-06-30 NOTE — Telephone Encounter (Signed)
See note  Copied from [place] [id number]. Topic: General - Inquiry >> Jun 30, 2019  8:16 AM Rayann Heman wrote: Reason for CRM: pt called and stated that he would like to know if he should continue getting b12 shot. Please advise

## 2019-06-30 NOTE — Telephone Encounter (Signed)
Please advise 

## 2019-07-01 ENCOUNTER — Other Ambulatory Visit: Payer: Self-pay

## 2019-07-01 DIAGNOSIS — E538 Deficiency of other specified B group vitamins: Secondary | ICD-10-CM

## 2019-07-01 NOTE — Telephone Encounter (Signed)
Patient notified labs placed.

## 2019-07-01 NOTE — Telephone Encounter (Signed)
See note

## 2019-07-04 ENCOUNTER — Other Ambulatory Visit: Payer: Self-pay

## 2019-07-05 ENCOUNTER — Other Ambulatory Visit (INDEPENDENT_AMBULATORY_CARE_PROVIDER_SITE_OTHER): Payer: PPO

## 2019-07-05 DIAGNOSIS — E538 Deficiency of other specified B group vitamins: Secondary | ICD-10-CM

## 2019-07-05 LAB — VITAMIN B12: Vitamin B-12: 655 pg/mL (ref 211–911)

## 2019-07-05 NOTE — Progress Notes (Signed)
Please inform patient of the following:  B12 level is normal - we can stop the injections, though recommend he continue oral supplementation with 1027mcg daily. We can recheck in 6 months or so.

## 2019-07-11 ENCOUNTER — Encounter: Payer: Self-pay | Admitting: Cardiovascular Disease

## 2019-07-11 ENCOUNTER — Other Ambulatory Visit: Payer: Self-pay

## 2019-07-11 ENCOUNTER — Ambulatory Visit (INDEPENDENT_AMBULATORY_CARE_PROVIDER_SITE_OTHER): Payer: PPO | Admitting: Cardiovascular Disease

## 2019-07-11 VITALS — BP 125/69 | HR 75 | Ht 68.0 in | Wt 161.0 lb

## 2019-07-11 DIAGNOSIS — R55 Syncope and collapse: Secondary | ICD-10-CM

## 2019-07-11 DIAGNOSIS — I2510 Atherosclerotic heart disease of native coronary artery without angina pectoris: Secondary | ICD-10-CM

## 2019-07-11 DIAGNOSIS — E119 Type 2 diabetes mellitus without complications: Secondary | ICD-10-CM | POA: Diagnosis not present

## 2019-07-11 DIAGNOSIS — I5042 Chronic combined systolic (congestive) and diastolic (congestive) heart failure: Secondary | ICD-10-CM

## 2019-07-11 NOTE — Progress Notes (Signed)
Cardiology Office Note   Date:  07/11/2019   ID:  Jeffrey [name], DOB 30-Aug-1955, MRN [id number]  PCP:  Vivi Barrack, MD  Cardiologist:   Skeet Latch, MD   No chief complaint on file.    History of Present Illness: Jeffrey [name] is a 63 y.o. male with CAD s/p MI, hypertension, hyperlipidemia, diabetes and peripheral neuropathy who presents for follow up. Jeffrey [name] had a cardiac arrest 11/23/13 and underwent PCI of the LAD. Echo 10/2015 revealed an anteroseptal and apical septal infarct with associated hypokinesis. LVEF was 55%. He had a PET CT/myocardial perfusion scan 10/2015 that revealed a moderate sized, moderate intensity antero-apical infarct with mild ischemia. At the end of 2016 he had an episode of stabbing chest [name]. 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 At that time his clopidogrel was discontinued and he underwent several laser procedures. He previously worked in a hospice facility as an Administrator, arts. He is now on disability because he is unable to lift patients.   Jeffrey [name] reported atypical chest [name]. He was referred for [facility] 09/2017 that revealed LVEF 48% and a prior infarct in the apical inferior, apical lateral, and apical regions. There was no ischemia. He has struggled with syncope and his antihypertensives were reduced at prior appointments.   Since his last appointment Jeffrey [name] was seen in the ED 05/2019 with an episode of syncope.  He noticed that his gardeners were cutting his azaleas.  He tried running around and started getting anxioius.  There was no preceding chest [name] but he did start feeling dizzy.  He thinks that this was mostly due to anxiety and getting worked up.  This occurred in the setting of generalized weakness, weight loss, and loose stools.  His work-up was remarkable only for mild intravascular volume depletion.  He received IV fluids and was instructed to  follow-up with his PCP the following day.  He saw Jeffrey [name] who recommended an echocardiogram but he declined.  Since that time he has been doing well.  He denies any recent dizziness or presyncope.  His appetite has been good.  His blood glucose has been difficult to control.  He has been working with Jeffrey [name] to figure out why he has been losing so much weight. He completed B12 injections.  He has seen a gastroenterologist and had an upper and lower endoscopy without an explanation.  He joined a gym but has not been able to go due to the coronavirus.  He tries to walk approximately once per week and notices that he is slowing down but has no shortness of breath.  He denies lower extremity edema, orthopnea, or PND.   Past Medical History:  Diagnosis Date  . Cardiac arrest ([place]) 05/22/2016  . CHF (congestive heart failure) ([place])   . Diabetes mellitus without complication ([place])   . Diabetic retinopathy ([place])   . Hyperlipidemia   . Hypertension   . Myocardial infarct ([place]) 2105  . S/P primary angioplasty with coronary stent 05/22/2016  . Vitamin B12 deficiency     Past Surgical History:  Procedure Laterality Date  . ANGIOPLASTY     2015  . REFRACTIVE SURGERY    . VITRECTOMY       Current Outpatient Medications  Medication Sig Dispense Refill  . aspirin EC 81 MG tablet Take 81 mg by mouth daily.    [name] Kitchen atorvastatin (LIPITOR) 80 MG tablet TAKE 1 TABLET  BY MOUTH EVERY DAY 90 tablet 2  . Blood Glucose Monitoring Suppl (ONETOUCH VERIO IQ SYSTEM) w/Device KIT 1 kit by Does not apply route 4 (four) times daily. 1 kit 0  . carvedilol (COREG) 3.125 MG tablet Take 1 tablet (3.125 mg total) by mouth 2 (two) times daily with a meal. 180 tablet 1  . furosemide (LASIX) 40 MG tablet TAKE 1 TABLET BY MOUTH EVERY DAY 90 tablet 0  . glucose blood test strip Check blood sugar 4 times per day 200 each 11  . insulin glargine (LANTUS) 100 UNIT/ML injection Inject 15-28 units twice daily. 30 mL 11  .  Insulin Syringe-Needle U-100 (B-D INS SYR HALF-UNIT .3CC/31G) 31G X 5/16" 0.3 ML MISC Use 6 times a day 200 each 11  . losartan (COZAAR) 25 MG tablet Take 1 tablet (25 mg total) by mouth daily. 90 tablet 1  . metFORMIN (GLUCOPHAGE-XR) 500 MG 24 hr tablet Take 2 tablets (1,000 mg total) by mouth 2 (two) times daily. 360 tablet 3  . OneTouch Delica Lancets 59R MISC Use to check blood sugar 4 times per day 200 each 11  . spironolactone (ALDACTONE) 25 MG tablet TAKE 1 TABLET BY MOUTH EVERY DAY 90 tablet 1   No current facility-administered medications for this visit.     Allergies:   Codeine and Penicillins    Social History:  The patient  reports that he has never smoked. He has never used smokeless tobacco. He reports previous alcohol use. He reports that he does not use drugs.   Family History:  The patient's family history includes Alzheimer's disease in his mother; Heart disease in his brother and father; Peripheral Artery Disease in his father.    ROS:  Please see the history of present illness.   Otherwise, review of systems are positive for cold hands.   All other systems are reviewed and negative.    PHYSICAL EXAM: VS:  BP 125/69   Pulse 75   Ht '5\' 8"'  (1.727 m)   Wt 161 lb (73 kg)   SpO2 99%   BMI 24.48 kg/m  , BMI Body mass index is 24.48 kg/m. GENERAL:  Well appearing HEENT: Pupils equal round and reactive, fundi not visualized, oral mucosa unremarkable NECK:  No jugular venous distention, waveform within normal limits, carotid upstroke brisk and symmetric, no bruits LUNGS:  Clear to auscultation bilaterally HEART:  RRR.  PMI not displaced or sustained,S1 and S2 within normal limits, no S3, no S4, no clicks, no rubs, no murmurs ABD:  Flat, positive bowel sounds normal in frequency in pitch, no bruits, no rebound, no guarding, no midline pulsatile mass, no hepatomegaly, no splenomegaly EXT:  2 plus pulses throughout, no edema, no cyanosis no clubbing SKIN:  No rashes no  nodules NEURO:  Cranial nerves II through XII grossly intact, motor grossly intact throughout PSYCH:  Cognitively intact, oriented to person place and time   EKG:  EKG is ordered today. The ekg ordered 05/22/16 demonstrates sinus rhythm rate 63 bpm.  Prior anteroseptal infarct.  LAFB 11/18/16: Sinus rhythm.  Rate 64 bpm.  Low voltage limb and precordial leads 09/16/17: Sinus bradycardia rate 59 bpm.   04/22/18: sinus rhythm.  Rate 69 bpm.  Low voltage.  Prior anterioseptal infarct 07/11/19: Sinus rhythm rate 75 bpm.Prior anterior infarct.  Echo 11/01/15: LVEF >55%.  Mild concentric LVH. Mild anteroseptal and apical septal hypokinesis. Normal RV function.  Lexiscan Myoview 09/2017:  The left ventricular ejection fraction is mildly decreased (45-54%).  Nuclear stress EF: 48%.  There was no ST segment deviation noted during stress.  Defect 1: There is a small defect of severe severity present in the apical inferior, apical lateral and apex location.  This is a low risk study.   Abnormal, low risk stress nuclear study with prior apical infarct; no ischemia; EF 48 with mild global hypokinesis; mild LVE.   Recent Labs: 02/08/2019: ALT 12; TSH 2.30 05/18/2019: BUN 35; Creatinine, Ser 1.49; Hemoglobin 11.4; Platelets 359; Potassium 3.8; Sodium 139    Lipid Panel    Component Value Date/Time   CHOL 116 01/06/2019 0819   TRIG 141 01/06/2019 0819   HDL 30 (L) 01/06/2019 0819   CHOLHDL 3.9 01/06/2019 0819   CHOLHDL 4 05/24/2018 0923   VLDL 34.8 05/24/2018 0923   LDLCALC 58 01/06/2019 0819    09/07/15: Sodium 138, potassium 4.5, BUN 20, creatinine 1.02 AST 17, ALT 24 Total cholesterol 113, triglycerides 146, HDL 32, LDL 52 TSH 1.76 WBC 11.7, hemoglobin 13.4, hematocrit 40.2, platelets 317 Hemoglobin A1c 8.9 on a relatively dry%  Wt Readings from Last 3 Encounters:  07/11/19 161 lb (73 kg)  05/19/19 159 lb (72.1 kg)  04/12/19 158 lb (71.7 kg)     ASSESSMENT AND PLAN:  #  Syncope: # Hypertension:  BPis stable and his dizziness has improved.  This most recent episode occurred in the setting of intravascular volume depletion.  There was no preceding chest [name] or palpitations.  However, we want to make sure that there is no reduction in LVEF.  He will continue carvedilol, losartan, furosemide and spironolactone.   # CAD s/p cardiac arrest and LAD PCI: No ischemic symptoms.  Lexiscan Myoview was negative for ischemia 09/2017. Continue aspirin, atorvastatin, and carvedilol.   # Hyperlipidemia.LDL 58 on 12/2018.  Continue atorvastatin.  # DM: He would be a good candidate for Jardiance and his insurance will cover it.  He will follow up with Jeffrey [name] to see if he can start it.   Current medicines are reviewed at length with the patient today.  The patient does not have concerns regarding medicines.  The following changes have been made: none Labs/ tests ordered today include:   Orders Placed This Encounter  Procedures  . EKG 12-Lead  . ECHOCARDIOGRAM COMPLETE     Disposition:   FU with Jeffrey Scharfenberg C. Oval Linsey, MD, [facility] in 6 months.    Signed, Jeffrey Plamondon C. Oval Linsey, MD, [facility]  07/11/2019 11:24 AM    [place] [facility]

## 2019-07-11 NOTE — Patient Instructions (Signed)
Medication Instructions:  .intscu  *If you need a refill on your cardiac medications before your next appointment, please call your pharmacy*  Lab Work: NONE   Testing/Procedures: Your physician has requested that you have an echocardiogram. Echocardiography is a painless test that uses sound waves to create images of your heart. It provides your doctor with information about the size and shape of your heart and how well your heart's chambers and valves are working. This procedure takes approximately one hour. There are no restrictions for this procedure. [address]  Follow-Up: At [facility], you and your health needs are our priority.  As part of our continuing mission to provide you with exceptional heart care, we have created designated Provider Care Teams.  These Care Teams include your primary Cardiologist (physician) and Advanced Practice Providers (APPs -  Physician Assistants and Nurse Practitioners) who all work together to provide you with the care you need, when you need it.  Your next appointment:   6 month(s)  The format for your next appointment:   Either In Person or Virtual  Provider:   You may see Skeet Latch, MD or one of the following Advanced Practice Providers on your designated Care Team:    Kerin Ransom, PA-C  [place], [place]  Coletta Memos, [place]   Other Instructions   Echocardiogram An echocardiogram is a procedure that uses painless sound waves (ultrasound) to produce an image of the heart. Images from an echocardiogram can provide important information about:  Signs of coronary artery disease (CAD).  Aneurysm detection. An aneurysm is a weak or damaged part of an artery wall that bulges out from the normal force of blood pumping through the body.  Heart size and shape. Changes in the size or shape of the heart can be associated with certain conditions, including heart failure, aneurysm, and CAD.  Heart  muscle function.  Heart valve function.  Signs of a past heart attack.  Fluid buildup around the heart.  Thickening of the heart muscle.  A tumor or infectious growth around the heart valves. Tell a health care provider about:  Any allergies you have.  All medicines you are taking, including vitamins, herbs, eye drops, creams, and over-the-counter medicines.  Any blood disorders you have.  Any surgeries you have had.  Any medical conditions you have.  Whether you are pregnant or may be pregnant. What are the risks? Generally, this is a safe procedure. However, problems may occur, including:  Allergic reaction to dye (contrast) that may be used during the procedure. What happens before the procedure? No specific preparation is needed. You may eat and drink normally. What happens during the procedure?   An IV tube may be inserted into one of your veins.  You may receive contrast through this tube. A contrast is an injection that improves the quality of the pictures from your heart.  A gel will be applied to your chest.  A wand-like tool (transducer) will be moved over your chest. The gel will help to transmit the sound waves from the transducer.  The sound waves will harmlessly bounce off of your heart to allow the heart images to be captured in real-time motion. The images will be recorded on a computer. The procedure may vary among health care providers and hospitals. What happens after the procedure?  You may return to your normal, everyday life, including diet, activities, and medicines, unless your health care provider tells you not to  do that. Summary  An echocardiogram is a procedure that uses painless sound waves (ultrasound) to produce an image of the heart.  Images from an echocardiogram can provide important information about the size and shape of your heart, heart muscle function, heart valve function, and fluid buildup around your heart.  You do not need  to do anything to prepare before this procedure. You may eat and drink normally.  After the echocardiogram is completed, you may return to your normal, everyday life, unless your health care provider tells you not to do that. This information is not intended to replace advice given to you by your health care provider. Make sure you discuss any questions you have with your health care provider. Document Released: 07/25/2000 Document Revised: 11/18/2018 Document Reviewed: 08/30/2016 Elsevier Patient Education  2020 [employer].

## 2019-07-22 ENCOUNTER — Other Ambulatory Visit: Payer: Self-pay

## 2019-07-22 ENCOUNTER — Ambulatory Visit (HOSPITAL_COMMUNITY): Payer: PPO | Attending: Cardiovascular Disease

## 2019-07-22 DIAGNOSIS — R55 Syncope and collapse: Secondary | ICD-10-CM | POA: Insufficient documentation

## 2019-07-22 DIAGNOSIS — I5042 Chronic combined systolic (congestive) and diastolic (congestive) heart failure: Secondary | ICD-10-CM

## 2019-07-25 ENCOUNTER — Telehealth: Payer: Self-pay | Admitting: Cardiovascular Disease

## 2019-07-25 NOTE — Telephone Encounter (Signed)
Patient is calling for his Echo results.  

## 2019-07-25 NOTE — Telephone Encounter (Signed)
Pt aware Dr Oval Linsey has not reviewed as of yet Will call pt with results and recommendations once reviewed ./cy

## 2019-07-26 NOTE — Telephone Encounter (Addendum)
Follow up   Patient calling to discuss ECHO in detail. Has questions about valve leakage. Patient concerned health has declined.

## 2019-07-26 NOTE — Telephone Encounter (Signed)
Echo not reviewed at this time ./cy

## 2019-07-27 NOTE — Telephone Encounter (Signed)
Earvin Hansen, LPN  [id number] PM EST    Advised patient of results    Skeet Latch, MD  07/27/2019 3:49 PM EST    Echo shows that his heart is squeezing 50-55% compared to 45-50% last year. This is essentially the same but maybe slightly better. Otherwise looks very stable.

## 2019-07-27 NOTE — Telephone Encounter (Signed)
Patient is calling in regards to Echo results. I informed her the results have still not been reviewed by Dr. Oval Linsey and she will be contacted once they are looked over.

## 2019-08-08 ENCOUNTER — Other Ambulatory Visit: Payer: Self-pay | Admitting: Family Medicine

## 2019-08-10 ENCOUNTER — Ambulatory Visit: Payer: PPO | Attending: Internal Medicine

## 2019-08-10 DIAGNOSIS — Z20822 Contact with and (suspected) exposure to covid-19: Secondary | ICD-10-CM

## 2019-08-10 DIAGNOSIS — Z20828 Contact with and (suspected) exposure to other viral communicable diseases: Secondary | ICD-10-CM | POA: Diagnosis not present

## 2019-08-11 LAB — NOVEL CORONAVIRUS, NAA: SARS-CoV-2, NAA: DETECTED — AB

## 2019-08-14 ENCOUNTER — Inpatient Hospital Stay (HOSPITAL_COMMUNITY)
Admission: EM | Admit: 2019-08-14 | Discharge: 2019-08-19 | DRG: 178 | Disposition: A | Discharge status: Home / Self Care | Payer: HMO | Attending: Internal Medicine | Admitting: Internal Medicine

## 2019-08-14 ENCOUNTER — Emergency Department (HOSPITAL_COMMUNITY): Payer: HMO

## 2019-08-14 ENCOUNTER — Other Ambulatory Visit: Payer: Self-pay

## 2019-08-14 DIAGNOSIS — E1159 Type 2 diabetes mellitus with other circulatory complications: Secondary | ICD-10-CM | POA: Diagnosis present

## 2019-08-14 DIAGNOSIS — Z794 Long term (current) use of insulin: Secondary | ICD-10-CM | POA: Diagnosis not present

## 2019-08-14 DIAGNOSIS — E785 Hyperlipidemia, unspecified: Secondary | ICD-10-CM | POA: Diagnosis present

## 2019-08-14 DIAGNOSIS — M791 Myalgia, unspecified site: Secondary | ICD-10-CM | POA: Diagnosis present

## 2019-08-14 DIAGNOSIS — E861 Hypovolemia: Secondary | ICD-10-CM | POA: Diagnosis present

## 2019-08-14 DIAGNOSIS — E119 Type 2 diabetes mellitus without complications: Secondary | ICD-10-CM

## 2019-08-14 DIAGNOSIS — R197 Diarrhea, unspecified: Secondary | ICD-10-CM | POA: Diagnosis present

## 2019-08-14 DIAGNOSIS — I252 Old myocardial infarction: Secondary | ICD-10-CM | POA: Diagnosis not present

## 2019-08-14 DIAGNOSIS — Z9861 Coronary angioplasty status: Secondary | ICD-10-CM | POA: Diagnosis not present

## 2019-08-14 DIAGNOSIS — Z820 Family history of epilepsy and other diseases of the nervous system: Secondary | ICD-10-CM

## 2019-08-14 DIAGNOSIS — E860 Dehydration: Secondary | ICD-10-CM | POA: Diagnosis present

## 2019-08-14 DIAGNOSIS — E1122 Type 2 diabetes mellitus with diabetic chronic kidney disease: Secondary | ICD-10-CM | POA: Diagnosis present

## 2019-08-14 DIAGNOSIS — I5042 Chronic combined systolic (congestive) and diastolic (congestive) heart failure: Secondary | ICD-10-CM | POA: Diagnosis present

## 2019-08-14 DIAGNOSIS — Z885 Allergy status to narcotic agent status: Secondary | ICD-10-CM

## 2019-08-14 DIAGNOSIS — U071 COVID-19: Secondary | ICD-10-CM | POA: Diagnosis present

## 2019-08-14 DIAGNOSIS — Z880 Allergy status to penicillin: Secondary | ICD-10-CM | POA: Diagnosis not present

## 2019-08-14 DIAGNOSIS — N179 Acute kidney failure, unspecified: Secondary | ICD-10-CM | POA: Diagnosis present

## 2019-08-14 DIAGNOSIS — E538 Deficiency of other specified B group vitamins: Secondary | ICD-10-CM | POA: Diagnosis present

## 2019-08-14 DIAGNOSIS — Z9114 Patient's other noncompliance with medication regimen: Secondary | ICD-10-CM

## 2019-08-14 DIAGNOSIS — I152 Hypertension secondary to endocrine disorders: Secondary | ICD-10-CM | POA: Diagnosis present

## 2019-08-14 DIAGNOSIS — E1165 Type 2 diabetes mellitus with hyperglycemia: Secondary | ICD-10-CM | POA: Diagnosis not present

## 2019-08-14 DIAGNOSIS — Z955 Presence of coronary angioplasty implant and graft: Secondary | ICD-10-CM

## 2019-08-14 DIAGNOSIS — N183 Chronic kidney disease, stage 3 unspecified: Secondary | ICD-10-CM | POA: Diagnosis present

## 2019-08-14 DIAGNOSIS — R42 Dizziness and giddiness: Secondary | ICD-10-CM | POA: Diagnosis not present

## 2019-08-14 DIAGNOSIS — R0602 Shortness of breath: Secondary | ICD-10-CM | POA: Diagnosis not present

## 2019-08-14 DIAGNOSIS — E1142 Type 2 diabetes mellitus with diabetic polyneuropathy: Secondary | ICD-10-CM | POA: Diagnosis not present

## 2019-08-14 DIAGNOSIS — E1169 Type 2 diabetes mellitus with other specified complication: Secondary | ICD-10-CM | POA: Diagnosis present

## 2019-08-14 DIAGNOSIS — R0902 Hypoxemia: Secondary | ICD-10-CM | POA: Diagnosis not present

## 2019-08-14 DIAGNOSIS — D649 Anemia, unspecified: Secondary | ICD-10-CM | POA: Diagnosis present

## 2019-08-14 DIAGNOSIS — Z7982 Long term (current) use of aspirin: Secondary | ICD-10-CM

## 2019-08-14 DIAGNOSIS — I2510 Atherosclerotic heart disease of native coronary artery without angina pectoris: Secondary | ICD-10-CM | POA: Diagnosis present

## 2019-08-14 DIAGNOSIS — R531 Weakness: Secondary | ICD-10-CM | POA: Diagnosis not present

## 2019-08-14 DIAGNOSIS — I10 Essential (primary) hypertension: Secondary | ICD-10-CM | POA: Diagnosis not present

## 2019-08-14 DIAGNOSIS — E11319 Type 2 diabetes mellitus with unspecified diabetic retinopathy without macular edema: Secondary | ICD-10-CM | POA: Diagnosis present

## 2019-08-14 DIAGNOSIS — Z8674 Personal history of sudden cardiac arrest: Secondary | ICD-10-CM

## 2019-08-14 DIAGNOSIS — Z79899 Other long term (current) drug therapy: Secondary | ICD-10-CM

## 2019-08-14 DIAGNOSIS — Z8249 Family history of ischemic heart disease and other diseases of the circulatory system: Secondary | ICD-10-CM

## 2019-08-14 DIAGNOSIS — I5032 Chronic diastolic (congestive) heart failure: Secondary | ICD-10-CM | POA: Diagnosis present

## 2019-08-14 LAB — COMPREHENSIVE METABOLIC PANEL
ALT: 21 U/L (ref 0–44)
AST: 22 U/L (ref 15–41)
Albumin: 3.4 g/dL — ABNORMAL LOW (ref 3.5–5.0)
Alkaline Phosphatase: 54 U/L (ref 38–126)
Anion gap: 10 (ref 5–15)
BUN: 41 mg/dL — ABNORMAL HIGH (ref 8–23)
CO2: 21 mmol/L — ABNORMAL LOW (ref 22–32)
Calcium: 8.5 mg/dL — ABNORMAL LOW (ref 8.9–10.3)
Chloride: 99 mmol/L (ref 98–111)
Creatinine, Ser: 2.08 mg/dL — ABNORMAL HIGH (ref 0.61–1.24)
GFR calc Af Amer: 38 mL/min — ABNORMAL LOW (ref >60)
GFR calc non Af Amer: 33 mL/min — ABNORMAL LOW (ref >60)
Glucose, Bld: 265 mg/dL — ABNORMAL HIGH (ref 70–99)
Potassium: 3.8 mmol/L (ref 3.5–5.1)
Sodium: 130 mmol/L — ABNORMAL LOW (ref 135–145)
Total Bilirubin: 0.7 mg/dL (ref 0.3–1.2)
Total Protein: 6.6 g/dL (ref 6.5–8.1)

## 2019-08-14 LAB — CBC WITH DIFFERENTIAL/PLATELET
Abs Immature Granulocytes: 0.02 10*3/uL (ref 0.00–0.07)
Basophils Absolute: 0 10*3/uL (ref 0.0–0.1)
Basophils Relative: 0 %
Eosinophils Absolute: 0 10*3/uL (ref 0.0–0.5)
Eosinophils Relative: 0 %
HCT: 32.9 % — ABNORMAL LOW (ref 39.0–52.0)
Hemoglobin: 11 g/dL — ABNORMAL LOW (ref 13.0–17.0)
Immature Granulocytes: 0 %
Lymphocytes Relative: 21 %
Lymphs Abs: 1.1 10*3/uL (ref 0.7–4.0)
MCH: 31 pg (ref 26.0–34.0)
MCHC: 33.4 g/dL (ref 30.0–36.0)
MCV: 92.7 fL (ref 80.0–100.0)
Monocytes Absolute: 0.7 10*3/uL (ref 0.1–1.0)
Monocytes Relative: 12 %
Neutro Abs: 3.5 10*3/uL (ref 1.7–7.7)
Neutrophils Relative %: 67 %
Platelets: 275 10*3/uL (ref 150–400)
RBC: 3.55 MIL/uL — ABNORMAL LOW (ref 4.22–5.81)
RDW: 12.6 % (ref 11.5–15.5)
WBC: 5.4 10*3/uL (ref 4.0–10.5)
nRBC: 0 % (ref 0.0–0.2)

## 2019-08-14 LAB — TSH: TSH: 1.774 u[IU]/mL (ref 0.350–4.500)

## 2019-08-14 LAB — PROCALCITONIN: Procalcitonin: <0.1 ng/mL

## 2019-08-14 LAB — LACTATE DEHYDROGENASE: LDH: 149 U/L (ref 98–192)

## 2019-08-14 LAB — FERRITIN: Ferritin: 240 ng/mL (ref 24–336)

## 2019-08-14 LAB — FIBRINOGEN: Fibrinogen: 472 mg/dL (ref 210–475)

## 2019-08-14 LAB — LACTIC ACID, PLASMA: Lactic Acid, Venous: 1.6 mmol/L (ref 0.5–1.9)

## 2019-08-14 LAB — TRIGLYCERIDES: Triglycerides: 103 mg/dL (ref <150)

## 2019-08-14 LAB — D-DIMER, QUANTITATIVE: D-Dimer, Quant: 0.7 ug/mL-FEU — ABNORMAL HIGH (ref 0.00–0.50)

## 2019-08-14 LAB — C-REACTIVE PROTEIN: CRP: 4.4 mg/dL — ABNORMAL HIGH (ref <1.0)

## 2019-08-14 LAB — BRAIN NATRIURETIC PEPTIDE: B Natriuretic Peptide: 70.8 pg/mL (ref 0.0–100.0)

## 2019-08-14 MED ORDER — SODIUM CHLORIDE 0.9 % IV SOLN
100.0000 mg | Freq: Every day | INTRAVENOUS | Status: AC
  Administered 2019-08-16 – 2019-08-19 (×4): 100 mg via INTRAVENOUS
  Filled 2019-08-14 (×5): qty 20

## 2019-08-14 MED ORDER — SODIUM CHLORIDE 0.9 % IV BOLUS
1000.0000 mL | Freq: Once | INTRAVENOUS | Status: AC
  Administered 2019-08-14: 1000 mL via INTRAVENOUS

## 2019-08-14 MED ORDER — SODIUM CHLORIDE 0.9 % IV SOLN
200.0000 mg | Freq: Once | INTRAVENOUS | Status: AC
  Administered 2019-08-15: 200 mg via INTRAVENOUS
  Filled 2019-08-14: qty 200

## 2019-08-14 NOTE — ED Provider Notes (Signed)
[facility] EMERGENCY DEPARTMENT Provider Note   CSN: [id number] Arrival date & time: 08/14/19  2006     History Chief Complaint  Patient presents with  . Weakness    Jeffrey [name] is a 64 y.o. male.  Patient is a poor historian.  States he has not felt well for several weeks.  He is here by EMS with generalized weakness and fatigue.  He had a positive Covid test that resulted today from December 30.  He states he was told to come to the hospital by the nurse to give him the results.  He states he feels he has no energy and feels excessively fatigued and has had nausea but no vomiting.  He has had chills and generalized weakness and myalgias but no documented fever.  He has not noticed any dark or bloody stools.  He is not noticed any increased work of breathing or productive cough.  He states he feels terrible and is tired of feeling this way.  He denies any focal weakness, numbness or tingling.  He has a history of CHF, diabetes, hypertension, previous CAD with cardiac arrest. Vitals are stable for EMS.  No hypoxia or increased work of breathing.  The history is provided by the patient.  Weakness Associated symptoms: arthralgias and myalgias   Associated symptoms: no abdominal pain, no chest pain, no dizziness, no dysuria, no fever, no headaches, no nausea, no shortness of breath and no vomiting        Past Medical History:  Diagnosis Date  . Cardiac arrest ([place]) 05/22/2016  . CHF (congestive heart failure) ([place])   . Diabetes mellitus without complication ([place])   . Diabetic retinopathy ([place])   . Hyperlipidemia   . Hypertension   . Myocardial infarct ([place]) 2105  . S/P primary angioplasty with coronary stent 05/22/2016  . Vitamin B12 deficiency     Patient Active Problem List   Diagnosis Date Noted  . Unintentional weight loss with loose stools 05/19/2019  . Low vitamin B12 level 03/29/2019  . Heme positive stool 03/14/2019  . Normocytic anemia  03/14/2019  . Chronic combined systolic and diastolic heart failure ([place]) 02/08/2019  . Diabetic retinopathy ([place]) 09/08/2018  . Physical debility 05/24/2018  . Varicose veins of both lower extremities 03/01/2018  . Fatigue due to exertion 10/14/2017  . GERD (gastroesophageal reflux disease) 08/25/2017  . CAD S/P percutaneous coronary angioplasty 04/14/2017  . Hyperlipidemia associated with type 2 diabetes mellitus ([place]) 04/14/2017  . Diabetic peripheral neuropathy associated with type 2 diabetes mellitus ([place]) 08/17/2016  . T2DM (type 2 diabetes mellitus) ([place]) 05/02/2016  . Hypertension associated with diabetes ([place]) 05/02/2016    Past Surgical History:  Procedure Laterality Date  . ANGIOPLASTY     2015  . REFRACTIVE SURGERY    . VITRECTOMY         Family History  Problem Relation Age of Onset  . Alzheimer's disease Mother   . Heart disease Father   . Peripheral Artery Disease Father   . Heart disease Brother   . Colon polyps Neg Hx   . Prostate cancer Neg Hx   . Rectal cancer Neg Hx   . Esophageal cancer Neg Hx     Social History   Tobacco Use  . Smoking status: Never Smoker  . Smokeless tobacco: Never Used  Substance Use Topics  . Alcohol use: Not Currently    Comment: occ  . Drug use: No    Home Medications Prior to Admission medications  Medication Sig Start Date End Date Taking? Authorizing Provider  aspirin EC 81 MG tablet Take 81 mg by mouth daily.    [provider]  atorvastatin (LIPITOR) 80 MG tablet TAKE 1 TABLET BY MOUTH EVERY DAY 05/18/19   Vivi Barrack, MD  Blood Glucose Monitoring Suppl (ONETOUCH VERIO IQ SYSTEM) w/Device KIT 1 kit by Does not apply route 4 (four) times daily. 03/11/18   Vivi Barrack, MD  carvedilol (COREG) 3.125 MG tablet Take 1 tablet (3.125 mg total) by mouth 2 (two) times daily with a meal. 04/14/19   Vivi Barrack, MD  furosemide (LASIX) 40 MG tablet TAKE 1 TABLET BY MOUTH EVERY DAY 08/08/19   Vivi Barrack,  MD  glucose blood test strip Check blood sugar 4 times per day 03/18/19   Vivi Barrack, MD  insulin glargine (LANTUS) 100 UNIT/ML injection Inject 15-28 units twice daily. 03/01/18   Vivi Barrack, MD  Insulin Syringe-Needle U-100 (B-D INS SYR HALF-UNIT .3CC/31G) 31G X 5/16" 0.3 ML MISC Use 6 times a day 09/07/17   Vivi Barrack, MD  losartan (COZAAR) 25 MG tablet Take 1 tablet (25 mg total) by mouth daily. 04/14/19   Vivi Barrack, MD  metFORMIN (GLUCOPHAGE-XR) 500 MG 24 hr tablet Take 2 tablets (1,000 mg total) by mouth 2 (two) times daily. 05/20/19   Vivi Barrack, MD  OneTouch Delica Lancets 00X MISC Use to check blood sugar 4 times per day 03/18/19   Vivi Barrack, MD  spironolactone (ALDACTONE) 25 MG tablet TAKE 1 TABLET BY MOUTH EVERY DAY 04/11/19   Vivi Barrack, MD    Allergies    Codeine and Penicillins  Review of Systems   Review of Systems  Constitutional: Positive for activity change, appetite change, chills and fatigue. Negative for fever.  HENT: Negative for congestion and rhinorrhea.   Respiratory: Negative for chest tightness and shortness of breath.   Cardiovascular: Negative for chest pain.  Gastrointestinal: Negative for abdominal pain, nausea and vomiting.  Genitourinary: Negative for dysuria and hematuria.  Musculoskeletal: Positive for arthralgias and myalgias.  Neurological: Positive for weakness. Negative for dizziness and headaches.    all other systems are negative except as noted in the HPI and PMH.   Physical Exam Updated Vital Signs BP 129/65 (BP Location: Left Arm)   Pulse 86   Temp 98.3 F (36.8 C) (Oral)   Resp (!) 22   Ht '5\' 10"'  (1.778 m)   SpO2 99%   BMI 23.10 kg/m   Physical Exam Vitals and nursing note reviewed.  Constitutional:      General: He is not in acute distress.    Appearance: Normal appearance. He is well-developed and normal weight. He is not ill-appearing.     Comments: Fatigue, not overly cooperative with questioning    HENT:     Head: Normocephalic and atraumatic.     Mouth/Throat:     Pharynx: No oropharyngeal exudate.  Eyes:     Conjunctiva/sclera: Conjunctivae normal.     Pupils: Pupils are equal, round, and reactive to light.  Neck:     Comments: No meningismus. Cardiovascular:     Rate and Rhythm: Normal rate and regular rhythm.     Heart sounds: Normal heart sounds. No murmur.  Pulmonary:     Effort: Pulmonary effort is normal. No respiratory distress.     Breath sounds: Normal breath sounds.     Comments: Lungs clear, no increased work of breathing Chest:  Chest wall: No tenderness.  Abdominal:     Palpations: Abdomen is soft.     Tenderness: There is no abdominal tenderness. There is no guarding or rebound.  Musculoskeletal:        General: No tenderness. Normal range of motion.     Cervical back: Normal range of motion and neck supple.  Skin:    General: Skin is warm.     Capillary Refill: Capillary refill takes less than 2 seconds.  Neurological:     General: No focal deficit present.     Mental Status: He is alert and oriented to person, place, and time. Mental status is at baseline.     Cranial Nerves: No cranial nerve deficit.     Motor: No abnormal muscle tone.     Coordination: Coordination normal.     Comments: No ataxia on finger to nose bilaterally. No pronator drift. 5/5 strength throughout. CN 2-12 intact.Equal grip strength. Sensation intact.   Psychiatric:        Behavior: Behavior normal.     ED Results / Procedures / Treatments   Labs (all labs ordered are listed, but only abnormal results are displayed) Labs Reviewed  CBC WITH DIFFERENTIAL/PLATELET - Abnormal; Notable for the following components:      Result Value   RBC 3.55 (*)    Hemoglobin 11.0 (*)    HCT 32.9 (*)    All other components within normal limits  COMPREHENSIVE METABOLIC PANEL - Abnormal; Notable for the following components:   Sodium 130 (*)    CO2 21 (*)    Glucose, Bld 265 (*)     BUN 41 (*)    Creatinine, Ser 2.08 (*)    Calcium 8.5 (*)    Albumin 3.4 (*)    GFR calc non Af Amer 33 (*)    GFR calc Af Amer 38 (*)    All other components within normal limits  D-DIMER, QUANTITATIVE (NOT AT [facility]) - Abnormal; Notable for the following components:   D-Dimer, Quant 0.70 (*)    All other components within normal limits  C-REACTIVE PROTEIN - Abnormal; Notable for the following components:   CRP 4.4 (*)    All other components within normal limits  CULTURE, BLOOD (ROUTINE X 2)  CULTURE, BLOOD (ROUTINE X 2)  LACTIC ACID, PLASMA  PROCALCITONIN  LACTATE DEHYDROGENASE  FERRITIN  FIBRINOGEN  BRAIN NATRIURETIC PEPTIDE  TRIGLYCERIDES  TSH  LACTIC ACID, PLASMA    EKG EKG Interpretation  Date/Time:  Sunday August 14 2019 20:31:02 EST Ventricular Rate:  77 PR Interval:    QRS Duration: 93 QT Interval:  371 QTC Calculation: 420 R Axis:   -15 Text Interpretation: Sinus rhythm Anteroseptal infarct, age indeterminate No significant change was found Confirmed by Ezequiel Essex [phone]) on 08/14/2019 9:18:30 PM   Radiology DG Chest Port 1 View  Result Date: 08/14/2019 CLINICAL DATA:  Shortness of breath EXAM: PORTABLE CHEST 1 VIEW COMPARISON:  05/18/2019 FINDINGS: The heart size and mediastinal contours are within normal limits. Both lungs are clear. The visualized skeletal structures are unremarkable. IMPRESSION: No active disease. Electronically Signed   By: Constance Holster M.D.   On: 08/14/2019 21:35    Procedures Procedures (including critical care time)  Medications Ordered in ED Medications - No data to display  ED Course  I have reviewed the triage vital signs and the nursing notes.  Pertinent labs & imaging results that were available during my care of the patient were reviewed by me and  considered in my medical decision making (see chart for details).    MDM Rules/Calculators/A&P                      Patient with several weeks of fatigue and  feeling unwell here with generalized weakness, myalgias, aches.  Covid positive as outpatient.  Fatigued but stable vitals. No increased work of breathing or hypoxia.  CXR, basic labs, gentle IVF.   EKG nonischemic. Labs reassuring. Mild renal failure. Elevated CRP and d-dimer.   Patient continues to feel poorly and generally weak. Would benefit from hydration overnight given AKI.  Will start remdesivir,hold steroids at this time as no hypoxia.  Admission d/w Dr. Hal Hope.  Jeffrey [name] was evaluated in Emergency Department on 08/14/2019 for the symptoms described in the history of present illness. He was evaluated in the context of the global COVID-19 pandemic, which necessitated consideration that the patient might be at risk for infection with the SARS-CoV-2 virus that causes COVID-19. Institutional protocols and algorithms that pertain to the evaluation of patients at risk for COVID-19 are in a state of rapid change based on information released by regulatory bodies including the CDC and federal and state organizations. These policies and algorithms were followed during the patient's care in the ED.  Final Clinical Impression(s) / ED Diagnoses Final diagnoses:  COVID-19 virus infection  AKI (acute kidney injury) [facility])    Rx / [place] Orders ED Discharge Orders    None       Maximilien Hayashi, Annie Main, MD 08/15/19 [phone]

## 2019-08-14 NOTE — ED Triage Notes (Signed)
Patient came in via ems ; c/o feeling unwell; along w/ weakness. Reported patient tested + for covid today.

## 2019-08-15 ENCOUNTER — Encounter (HOSPITAL_COMMUNITY): Payer: Self-pay | Admitting: Internal Medicine

## 2019-08-15 DIAGNOSIS — E1122 Type 2 diabetes mellitus with diabetic chronic kidney disease: Secondary | ICD-10-CM | POA: Diagnosis present

## 2019-08-15 DIAGNOSIS — E11319 Type 2 diabetes mellitus with unspecified diabetic retinopathy without macular edema: Secondary | ICD-10-CM | POA: Diagnosis present

## 2019-08-15 DIAGNOSIS — Z885 Allergy status to narcotic agent status: Secondary | ICD-10-CM | POA: Diagnosis not present

## 2019-08-15 DIAGNOSIS — E538 Deficiency of other specified B group vitamins: Secondary | ICD-10-CM | POA: Diagnosis present

## 2019-08-15 DIAGNOSIS — U071 COVID-19: Secondary | ICD-10-CM | POA: Diagnosis present

## 2019-08-15 DIAGNOSIS — Z9861 Coronary angioplasty status: Secondary | ICD-10-CM

## 2019-08-15 DIAGNOSIS — I152 Hypertension secondary to endocrine disorders: Secondary | ICD-10-CM | POA: Diagnosis present

## 2019-08-15 DIAGNOSIS — Z955 Presence of coronary angioplasty implant and graft: Secondary | ICD-10-CM | POA: Diagnosis not present

## 2019-08-15 DIAGNOSIS — E1159 Type 2 diabetes mellitus with other circulatory complications: Secondary | ICD-10-CM | POA: Diagnosis not present

## 2019-08-15 DIAGNOSIS — E860 Dehydration: Secondary | ICD-10-CM | POA: Diagnosis present

## 2019-08-15 DIAGNOSIS — Z7982 Long term (current) use of aspirin: Secondary | ICD-10-CM | POA: Diagnosis not present

## 2019-08-15 DIAGNOSIS — Z794 Long term (current) use of insulin: Secondary | ICD-10-CM | POA: Diagnosis not present

## 2019-08-15 DIAGNOSIS — N179 Acute kidney failure, unspecified: Secondary | ICD-10-CM | POA: Diagnosis present

## 2019-08-15 DIAGNOSIS — E1169 Type 2 diabetes mellitus with other specified complication: Secondary | ICD-10-CM | POA: Diagnosis present

## 2019-08-15 DIAGNOSIS — I5042 Chronic combined systolic (congestive) and diastolic (congestive) heart failure: Secondary | ICD-10-CM | POA: Diagnosis present

## 2019-08-15 DIAGNOSIS — N183 Chronic kidney disease, stage 3 unspecified: Secondary | ICD-10-CM | POA: Diagnosis present

## 2019-08-15 DIAGNOSIS — Z820 Family history of epilepsy and other diseases of the nervous system: Secondary | ICD-10-CM | POA: Diagnosis not present

## 2019-08-15 DIAGNOSIS — R197 Diarrhea, unspecified: Secondary | ICD-10-CM | POA: Diagnosis present

## 2019-08-15 DIAGNOSIS — D649 Anemia, unspecified: Secondary | ICD-10-CM | POA: Diagnosis present

## 2019-08-15 DIAGNOSIS — Z880 Allergy status to penicillin: Secondary | ICD-10-CM | POA: Diagnosis not present

## 2019-08-15 DIAGNOSIS — M791 Myalgia, unspecified site: Secondary | ICD-10-CM | POA: Diagnosis present

## 2019-08-15 DIAGNOSIS — E785 Hyperlipidemia, unspecified: Secondary | ICD-10-CM | POA: Diagnosis present

## 2019-08-15 DIAGNOSIS — I2510 Atherosclerotic heart disease of native coronary artery without angina pectoris: Secondary | ICD-10-CM

## 2019-08-15 DIAGNOSIS — E1142 Type 2 diabetes mellitus with diabetic polyneuropathy: Secondary | ICD-10-CM | POA: Diagnosis not present

## 2019-08-15 DIAGNOSIS — E861 Hypovolemia: Secondary | ICD-10-CM | POA: Diagnosis present

## 2019-08-15 DIAGNOSIS — I252 Old myocardial infarction: Secondary | ICD-10-CM | POA: Diagnosis not present

## 2019-08-15 LAB — CBC WITH DIFFERENTIAL/PLATELET
Abs Immature Granulocytes: 0.01 K/uL (ref 0.00–0.07)
Basophils Absolute: 0 K/uL (ref 0.0–0.1)
Basophils Relative: 0 %
Eosinophils Absolute: 0 K/uL (ref 0.0–0.5)
Eosinophils Relative: 0 %
HCT: 30.4 % — ABNORMAL LOW (ref 39.0–52.0)
Hemoglobin: 10.2 g/dL — ABNORMAL LOW (ref 13.0–17.0)
Immature Granulocytes: 0 %
Lymphocytes Relative: 30 %
Lymphs Abs: 1.3 K/uL (ref 0.7–4.0)
MCH: 31.2 pg (ref 26.0–34.0)
MCHC: 33.6 g/dL (ref 30.0–36.0)
MCV: 93 fL (ref 80.0–100.0)
Monocytes Absolute: 0.4 K/uL (ref 0.1–1.0)
Monocytes Relative: 10 %
Neutro Abs: 2.5 K/uL (ref 1.7–7.7)
Neutrophils Relative %: 60 %
Platelets: 254 K/uL (ref 150–400)
RBC: 3.27 MIL/uL — ABNORMAL LOW (ref 4.22–5.81)
RDW: 12.6 % (ref 11.5–15.5)
WBC: 4.2 K/uL (ref 4.0–10.5)
nRBC: 0 % (ref 0.0–0.2)

## 2019-08-15 LAB — TSH: TSH: 1.943 u[IU]/mL (ref 0.350–4.500)

## 2019-08-15 LAB — TROPONIN I (HIGH SENSITIVITY)
Troponin I (High Sensitivity): 24 ng/L — ABNORMAL HIGH
Troponin I (High Sensitivity): 25 ng/L — ABNORMAL HIGH

## 2019-08-15 LAB — COMPREHENSIVE METABOLIC PANEL WITH GFR
ALT: 19 U/L (ref 0–44)
AST: 21 U/L (ref 15–41)
Albumin: 2.9 g/dL — ABNORMAL LOW (ref 3.5–5.0)
Alkaline Phosphatase: 44 U/L (ref 38–126)
Anion gap: 11 (ref 5–15)
BUN: 37 mg/dL — ABNORMAL HIGH (ref 8–23)
CO2: 20 mmol/L — ABNORMAL LOW (ref 22–32)
Calcium: 7.8 mg/dL — ABNORMAL LOW (ref 8.9–10.3)
Chloride: 104 mmol/L (ref 98–111)
Creatinine, Ser: 1.83 mg/dL — ABNORMAL HIGH (ref 0.61–1.24)
GFR calc Af Amer: 44 mL/min — ABNORMAL LOW
GFR calc non Af Amer: 38 mL/min — ABNORMAL LOW
Glucose, Bld: 286 mg/dL — ABNORMAL HIGH (ref 70–99)
Potassium: 3.8 mmol/L (ref 3.5–5.1)
Sodium: 135 mmol/L (ref 135–145)
Total Bilirubin: 0.4 mg/dL (ref 0.3–1.2)
Total Protein: 5.4 g/dL — ABNORMAL LOW (ref 6.5–8.1)

## 2019-08-15 LAB — C-REACTIVE PROTEIN: CRP: 4 mg/dL — ABNORMAL HIGH

## 2019-08-15 LAB — CK: Total CK: 73 U/L (ref 49–397)

## 2019-08-15 LAB — CBG MONITORING, ED
Glucose-Capillary: 241 mg/dL — ABNORMAL HIGH (ref 70–99)
Glucose-Capillary: 172 mg/dL — ABNORMAL HIGH (ref 70–99)
Glucose-Capillary: 140 mg/dL — ABNORMAL HIGH (ref 70–99)

## 2019-08-15 LAB — D-DIMER, QUANTITATIVE: D-Dimer, Quant: 0.67 ug{FEU}/mL — ABNORMAL HIGH (ref 0.00–0.50)

## 2019-08-15 LAB — FERRITIN: Ferritin: 239 ng/mL (ref 24–336)

## 2019-08-15 LAB — HIV ANTIBODY (ROUTINE TESTING W REFLEX): HIV Screen 4th Generation wRfx: NONREACTIVE

## 2019-08-15 LAB — MAGNESIUM: Magnesium: 1.4 mg/dL — ABNORMAL LOW (ref 1.7–2.4)

## 2019-08-15 LAB — LACTIC ACID, PLASMA: Lactic Acid, Venous: 0.8 mmol/L (ref 0.5–1.9)

## 2019-08-15 MED ORDER — SODIUM CHLORIDE 0.9 % IV SOLN: INTRAVENOUS | Status: AC

## 2019-08-15 MED ORDER — ATORVASTATIN CALCIUM 80 MG PO TABS
80.0000 mg | ORAL_TABLET | Freq: Every day | ORAL | Status: DC
  Administered 2019-08-15 – 2019-08-19 (×5): 80 mg via ORAL
  Filled 2019-08-15 (×5): qty 1

## 2019-08-15 MED ORDER — ASPIRIN EC 81 MG PO TBEC
81.0000 mg | DELAYED_RELEASE_TABLET | Freq: Every day | ORAL | Status: DC
  Administered 2019-08-15 – 2019-08-19 (×5): 81 mg via ORAL
  Filled 2019-08-15 (×5): qty 1

## 2019-08-15 MED ORDER — ONDANSETRON HCL 4 MG PO TABS
4.0000 mg | ORAL_TABLET | Freq: Four times a day (QID) | ORAL | Status: DC | PRN
  Administered 2019-08-17: 4 mg via ORAL
  Filled 2019-08-15: qty 1

## 2019-08-15 MED ORDER — INSULIN ASPART 100 UNIT/ML ~~LOC~~ SOLN
0.0000 [IU] | Freq: Three times a day (TID) | SUBCUTANEOUS | Status: DC
  Administered 2019-08-15: 3 [IU] via SUBCUTANEOUS
  Administered 2019-08-15: 1 [IU] via SUBCUTANEOUS
  Administered 2019-08-15 – 2019-08-16 (×2): 2 [IU] via SUBCUTANEOUS
  Administered 2019-08-16: 1 [IU] via SUBCUTANEOUS
  Administered 2019-08-17: 3 [IU] via SUBCUTANEOUS
  Administered 2019-08-17 – 2019-08-18 (×3): 1 [IU] via SUBCUTANEOUS
  Administered 2019-08-18 (×2): 2 [IU] via SUBCUTANEOUS
  Administered 2019-08-19: 3 [IU] via SUBCUTANEOUS

## 2019-08-15 MED ORDER — ACETAMINOPHEN 325 MG PO TABS: 650.0000 mg | ORAL_TABLET | Freq: Four times a day (QID) | ORAL | Status: DC | PRN

## 2019-08-15 MED ORDER — MAGNESIUM SULFATE 2 GM/50ML IV SOLN
2.0000 g | Freq: Once | INTRAVENOUS | Status: AC
  Administered 2019-08-15: 2 g via INTRAVENOUS
  Filled 2019-08-15: qty 50

## 2019-08-15 MED ORDER — INSULIN GLARGINE 100 UNIT/ML ~~LOC~~ SOLN
10.0000 [IU] | Freq: Every day | SUBCUTANEOUS | Status: DC
  Administered 2019-08-15 – 2019-08-19 (×5): 10 [IU] via SUBCUTANEOUS
  Filled 2019-08-15 (×5): qty 0.1

## 2019-08-15 MED ORDER — PANTOPRAZOLE SODIUM 40 MG IV SOLR
40.0000 mg | Freq: Two times a day (BID) | INTRAVENOUS | Status: DC
  Administered 2019-08-15 – 2019-08-19 (×9): 40 mg via INTRAVENOUS
  Filled 2019-08-15 (×9): qty 40

## 2019-08-15 MED ORDER — CARVEDILOL 3.125 MG PO TABS
3.1250 mg | ORAL_TABLET | Freq: Two times a day (BID) | ORAL | Status: DC
  Administered 2019-08-15 – 2019-08-19 (×8): 3.125 mg via ORAL
  Filled 2019-08-15 (×12): qty 1

## 2019-08-15 MED ORDER — ENOXAPARIN SODIUM 40 MG/0.4ML ~~LOC~~ SOLN
40.0000 mg | Freq: Every day | SUBCUTANEOUS | Status: DC
  Administered 2019-08-15 – 2019-08-19 (×5): 40 mg via SUBCUTANEOUS
  Filled 2019-08-15 (×5): qty 0.4

## 2019-08-15 MED ORDER — ACETAMINOPHEN 650 MG RE SUPP: 650.0000 mg | Freq: Four times a day (QID) | RECTAL | Status: DC | PRN

## 2019-08-15 MED ORDER — ONDANSETRON HCL 4 MG/2ML IJ SOLN: 4.0000 mg | Freq: Four times a day (QID) | INTRAMUSCULAR | Status: DC | PRN

## 2019-08-15 NOTE — H&P (Signed)
History and Physical    Jeffrey [name] TGP:[id number] DOB: [birth year] DOA: 08/14/2019  PCP: Vivi Barrack, MD  Patient coming from: Home.  Chief Complaint: Weakness and fatigue.  HPI: Jeffrey [name] is a 64 y.o. male with history of CAD status post stenting with history of cardiac arrest, diastolic dysfunction last EF measured was in December 2020 with EF of 50 to 55% diabetes mellitus type 2 chronic kidney disease stage III baseline creatinine around 1.4 has been experiencing increasing fatigue and weakness over the last 3 weeks.  Denies any chest pain but has been having poor appetite with some nausea denies vomiting.  Patient on December 30 had worsening symptoms and at that time got check for COVID-19 results of which came yesterday.  Which was positive.  On December 30 patient also had some diarrhea.  Which was self-limited.  Patient states his twin brother also has diagnosed with COVID-19 who lives with him.  Note that patient has not been taking any of his medications including insulin for last 3 weeks because of the weakness.  ED Course: In the ER patient was afebrile not hypoxic.  Blood labs show creatinine is worsened from 1.4-2.08 patient looks dehydrated and weak and difficult to ambulate because of the weakness and dehydration.  Was given 1 L fluid bolus.  Other labs show sodium of 130 hemoglobin 11 which is at baseline.  CRP 4.4 lactic acid 1.6.  Given the symptoms patient in addition to fluid also was given remdesivir.  Chest x-ray was unremarkable.  Review of Systems: As per HPI, rest all negative.   Past Medical History:  Diagnosis Date  . Cardiac arrest ([place]) 05/22/2016  . CHF (congestive heart failure) ([place])   . Diabetes mellitus without complication ([place])   . Diabetic retinopathy ([place])   . Hyperlipidemia   . Hypertension   . Myocardial infarct ([place]) 2105  . S/P primary angioplasty with coronary stent 05/22/2016  . Vitamin B12 deficiency     Past Surgical  History:  Procedure Laterality Date  . ANGIOPLASTY     2015  . REFRACTIVE SURGERY    . VITRECTOMY       reports that he has never smoked. He has never used smokeless tobacco. He reports previous alcohol use. He reports that he does not use drugs.  Allergies  Allergen Reactions  . Codeine Nausea And Vomiting  . Penicillins Rash    Did it involve swelling of the face/tongue/throat, SOB, or low BP? no Did it involve sudden or severe rash/hives, skin peeling, or any reaction on the inside of your mouth or nose? yes Did you need to seek medical attention at a hospital or doctor's office? yes When did it last happen?childhood If all above answers are "NO", may proceed with cephalosporin use.     Family History  Problem Relation Age of Onset  . Alzheimer's disease Mother   . Heart disease Father   . Peripheral Artery Disease Father   . Heart disease Brother   . Colon polyps Neg Hx   . Prostate cancer Neg Hx   . Rectal cancer Neg Hx   . Esophageal cancer Neg Hx     Prior to Admission medications   Medication Sig Start Date End Date Taking? Authorizing Provider  aspirin EC 81 MG tablet Take 81 mg by mouth daily.   Yes [provider]  atorvastatin (LIPITOR) 80 MG tablet TAKE 1 TABLET BY MOUTH EVERY DAY 05/18/19  Yes Vivi Barrack, MD  carvedilol (  COREG) 3.125 MG tablet Take 1 tablet (3.125 mg total) by mouth 2 (two) times daily with a meal. 04/14/19  Yes Vivi Barrack, MD  cholecalciferol (VITAMIN D3) 25 MCG (1000 UT) tablet Take 1,000 Units by mouth daily.   Yes [provider]  furosemide (LASIX) 40 MG tablet TAKE 1 TABLET BY MOUTH EVERY DAY Patient taking differently: Take 40 mg by mouth daily.  08/08/19  Yes Vivi Barrack, MD  insulin glargine (LANTUS) 100 UNIT/ML injection Inject 15-28 units twice daily. Patient taking differently: Inject 30 Units into the skin every morning.  03/01/18  Yes Vivi Barrack, MD  losartan (COZAAR) 25 MG tablet Take 1  tablet (25 mg total) by mouth daily. 04/14/19  Yes Vivi Barrack, MD  metFORMIN (GLUCOPHAGE-XR) 500 MG 24 hr tablet Take 2 tablets (1,000 mg total) by mouth 2 (two) times daily. 05/20/19  Yes Vivi Barrack, MD  spironolactone (ALDACTONE) 25 MG tablet TAKE 1 TABLET BY MOUTH EVERY DAY 04/11/19  Yes Vivi Barrack, MD  Blood Glucose Monitoring Suppl (ONETOUCH VERIO IQ SYSTEM) w/Device KIT 1 kit by Does not apply route 4 (four) times daily. 03/11/18   Vivi Barrack, MD  glucose blood test strip Check blood sugar 4 times per day 03/18/19   Vivi Barrack, MD  Insulin Syringe-Needle U-100 (B-D INS SYR HALF-UNIT .3CC/31G) 31G X 5/16" 0.3 ML MISC Use 6 times a day 09/07/17   Vivi Barrack, MD  OneTouch Delica Lancets 67E MISC Use to check blood sugar 4 times per day 03/18/19   Vivi Barrack, MD    Physical Exam: Constitutional: Moderately built and nourished. Vitals:   08/14/19 2345 08/15/19 0000 08/15/19 0215 08/15/19 0245  BP: 125/62 126/67 126/67 121/65  Pulse: 72 73 70 66  Resp: (!) 25 15 (!) 23 (!) 26  Temp:      TempSrc:      SpO2: 95% 94% 94% 94%  Height:       Eyes: Anicteric no pallor. ENMT: No discharge from the ears eyes nose or mouth. Neck: No JVD appreciated no mass felt. Respiratory: No rhonchi or crepitations. Cardiovascular: S1-S2 heard. Abdomen: Soft nontender bowel sound present. Musculoskeletal: No edema.  No joint effusion. Skin: No rash. Neurologic: Alert awake oriented time place and person.  Moves all extremities. Psychiatric: Appears normal but normal affect.   Labs on Admission: I have personally reviewed following labs and imaging studies  CBC: Recent Labs  Lab 08/14/19 2044  WBC 5.4  NEUTROABS 3.5  HGB 11.0*  HCT 32.9*  MCV 92.7  PLT 720   Basic Metabolic Panel: Recent Labs  Lab 08/14/19 2044  NA 130*  K 3.8  CL 99  CO2 21*  GLUCOSE 265*  BUN 41*  CREATININE 2.08*  CALCIUM 8.5*   GFR: CrCl cannot be calculated (Unknown ideal  weight.). Liver Function Tests: Recent Labs  Lab 08/14/19 2044  AST 22  ALT 21  ALKPHOS 54  BILITOT 0.7  PROT 6.6  ALBUMIN 3.4*   No results for input(s): LIPASE, AMYLASE in the last 168 hours. No results for input(s): AMMONIA in the last 168 hours. Coagulation Profile: No results for input(s): INR, PROTIME in the last 168 hours. Cardiac Enzymes: No results for input(s): CKTOTAL, CKMB, CKMBINDEX, TROPONINI in the last 168 hours. BNP (last 3 results) No results for input(s): PROBNP in the last 8760 hours. HbA1C: No results for input(s): HGBA1C in the last 72 hours. CBG: No results for input(s):  GLUCAP in the last 168 hours. Lipid Profile: Recent Labs    08/14/19 2044  TRIG 103   Thyroid Function Tests: Recent Labs    08/14/19 2044  TSH 1.774   Anemia Panel: Recent Labs    08/14/19 2044  FERRITIN 240   Urine analysis: No results found for: COLORURINE, APPEARANCEUR, LABSPEC, PHURINE, GLUCOSEU, HGBUR, BILIRUBINUR, KETONESUR, PROTEINUR, UROBILINOGEN, NITRITE, LEUKOCYTESUR Sepsis Labs: '@LABRCNTIP' (procalcitonin:4,lacticidven:4) ) Recent Results (from the past 240 hour(s))  Novel Coronavirus, NAA ([facility])     Status: Abnormal   Collection Time: 08/10/19  8:49 AM   Specimen: Nasopharyngeal(NP) swabs in vial transport medium   NASOPHARYNGE  TESTING  Result Value Ref Range Status   SARS-CoV-2, NAA Detected (A) Not Detected Final    Comment: This nucleic acid amplification test was developed and its performance characteristics determined by [employer]. Nucleic acid amplification tests include PCR and TMA. This test has not been FDA cleared or approved. This test has been authorized by FDA under an Emergency Use Authorization (EUA). This test is only authorized for the duration of time the declaration that circumstances exist justifying the authorization of the emergency use of in vitro diagnostic tests for detection of SARS-CoV-2 virus and/or diagnosis of  COVID-19 infection under section 564(b)(1) of the Act, 21 U.S.C. 480XKP-5(V) (1), unless the authorization is terminated or revoked sooner. When diagnostic testing is negative, the possibility of a false negative result should be considered in the context of a patient's recent exposures and the presence of clinical signs and symptoms consistent with COVID-19. An individual without symptoms of COVID-19 and who is not shedding SARS-CoV-2 virus would  expect to have a negative (not detected) result in this assay.      Radiological Exams on Admission: DG Chest Port 1 View  Result Date: 08/14/2019 CLINICAL DATA:  Shortness of breath EXAM: PORTABLE CHEST 1 VIEW COMPARISON:  05/18/2019 FINDINGS: The heart size and mediastinal contours are within normal limits. Both lungs are clear. The visualized skeletal structures are unremarkable. IMPRESSION: No active disease. Electronically Signed   By: Constance Holster M.D.   On: 08/14/2019 21:35    EKG: Independently reviewed.  Normal sinus rhythm.  Assessment/Plan Active Problems:   T2DM (type 2 diabetes mellitus) ([place])   Hypertension associated with diabetes ([place])   CAD S/P percutaneous coronary angioplasty   Hyperlipidemia associated with type 2 diabetes mellitus ([place])   Chronic combined systolic and diastolic heart failure ([place])   COVID-19 virus detected   AKI (acute kidney injury) ([place])    1. Acute renal failure with generalized weakness poor appetite likely symptoms are related to patient's COVID-19 infection for which patient was started on IV fluid 1 L bolus was given.  I will continue normal saline infusion for another 12 hours but will be cautious to make sure patient does not get fluid overloaded given the history of CHF.  Patient also has been started on IV remdesivir for the COVID-19.  Not started on Decadron because the chest x-ray does not show any infiltrate and patient is not hypoxic.  Patient is afebrile.  CRP was only 4.4 and since  patient is not hypoxic and chest x-ray is normal showing infiltrates Actemra was not discussed.  In addition we will check CK levels TSH and cardiac markers.         Due to renal failure and holding of Lasix spironolactone and Cozaar.   2. Diabetes mellitus type 2 has not been taking his medications for which I will keep patient  on sliding scale coverage for now.  Note that patient used to take Lantus at home.  Closely follow CBGs. 3. Nausea and poor appetite likely from Covid abdomen appears benign closely monitor. 4. CAD status post stenting -had completely stopped taking any medications.  I have restarted patient on aspirin statins.  If CK levels able to stop statins.  Check cardiac markers. 5. Chronic CHF last EF measured in December last month was 50 to 55%.  Patient's spironolactone Lasix and Cozaar on hold due to dehydration. 6. Chronic anemia follow CBC.  Given that patient has acute renal failure poor appetite with COVID-19 infection will need close monitoring for any week further deterioration.   DVT prophylaxis: Lovenox. Code Status: Full code confirmed with patient. Family Communication: Discussed with patient. Disposition Plan: Home when stable. Consults called: None. Admission status: Inpatient.   Rise Patience MD [facility] Pager [phone].  If 7PM-7AM, please contact night-coverage [email or web address] Password [facility]  08/15/2019, 5:39 AM

## 2019-08-15 NOTE — Progress Notes (Addendum)
PROGRESS NOTE    Jeffrey [name]  [id number] DOB: 07/18/1956 DOA: 08/14/2019 PCP: Vivi Barrack, MD   Brief Narrative:  Jeffrey [name] is a 64 y.o. male with history of CAD status post stenting with history of cardiac arrest, diastolic dysfunction last EF measured was in December 2020 with EF of 50 to 55% diabetes mellitus type 2 chronic kidney disease stage III baseline creatinine around 1.4 has been experiencing increasing fatigue and weakness over the last 3 weeks.  Denies any chest pain but has been having poor appetite with some nausea denies vomiting.  Patient on December 30 had worsening symptoms and at that time got check for COVID-19 results of which came yesterday. Which was positive.   Evaluation in the ED, patient was afebrile no hypoxemia.  Creatinine increased to 2.0.  Patient dehydrated unable to ambulate because of weakness.  Chest x-ray unremarkable.  Assessment & Plan:   Active Problems:   T2DM (type 2 diabetes mellitus) ([name])   Hypertension associated with diabetes ([place])   CAD S/P percutaneous coronary angioplasty   Hyperlipidemia associated with type 2 diabetes mellitus ([place])   Chronic combined systolic and diastolic heart failure ([place])   COVID-19 virus detected   AKI (acute kidney injury) ([place])   1-Acute renal failure on chronic kidney disease stage III: Prior creatinine per records 1.4.  Patient presented with a creatinine of 2.0. In the setting of poor oral intake hypovolemia. Slightly improved to 1.8.  Patient will benefit of continuation of IV fluids. Agree with holding Lasix, spironolactone and Cozaar.  2-COVID-19 viral illness: Patient presented with dehydration, poor oral intake, weakness. Was a started on Remdesivir,  continue If patient developed hypoxemia will need to start IV dexamethasone. COVID-19 Labs  Recent Labs    08/14/19 2044 08/15/19 0605  DDIMER 0.70* 0.67*  FERRITIN 240 239  LDH 149  --   CRP 4.4* 4.0*    Lab Results    Component Value Date   SARSCOV2NAA Detected (A) 08/10/2019   3-DM type II: Blood sugar more than 200 will resume low-dose Lantus. Continue with a sliding scale insulin.  4-gastritis: Patient report nausea, burping. Start IV Protonix.  5-CAD status post stenting: Resume aspirin and statins. Mild elevation of cardiac markers in the setting of renal failure Denies chest pain  Chronic anemia: Follow trend Hypomagnesemia; replete IV>   Estimated body mass index is 23.1 kg/m as calculated from the following:   Height as of this encounter: 5\' 10"  (1.778 m).   Weight as of 07/11/19: 73 kg.   DVT prophylaxis: Lovenox Code Status: Full code Family Communication: Care discussed with patient Disposition Plan: Remain in the hospital to receive IV hydration, will need physical therapist evaluation, and Remdesivir.  Consultants:   none  Procedures:   none  Antimicrobials:    Subjective: Patient is alert, reports feeling weak and tired.  Poor oral intake.  Reports nausea, burping.  Denies diarrhea  Objective: Vitals:   08/15/19 1130 08/15/19 1145 08/15/19 1200 08/15/19 1215  BP: 105/63 118/64 (!) 124/58 (!) 108/55  Pulse: (!) 102 86 73 69  Resp:      Temp:      TempSrc:      SpO2: 97% 97% 96% 96%  Height:        Intake/Output Summary (Last 24 hours) at 08/15/2019 1328 Last data filed at 08/15/2019 1214 Gross per 24 hour  Intake 50 ml  Output --  Net 50 ml   There were no vitals filed for this  visit.  Examination:  General exam: Appears calm and comfortable  Respiratory system: Clear to auscultation. Respiratory effort normal. Cardiovascular system: S1 & S2 heard, RRR. No JVD, murmurs, rubs, gallops or clicks. No pedal edema. Gastrointestinal system: Abdomen is nondistended, soft and nontender. No organomegaly or masses felt. Normal bowel sounds heard. Central nervous system: Alert and oriented. No focal neurological deficits. Extremities: Symmetric 5 x 5  power. Skin: No rashes, lesions or ulcers Psychiatry: Judgement and insight appear normal. Mood & affect appropriate.     Data Reviewed: I have personally reviewed following labs and imaging studies  CBC: Recent Labs  Lab 08/14/19 2044 08/15/19 0605  WBC 5.4 4.2  NEUTROABS 3.5 2.5  HGB 11.0* 10.2*  HCT 32.9* 30.4*  MCV 92.7 93.0  PLT 275 0000000   Basic Metabolic Panel: Recent Labs  Lab 08/14/19 2044 08/15/19 0605  NA 130* 135  K 3.8 3.8  CL 99 104  CO2 21* 20*  GLUCOSE 265* 286*  BUN 41* 37*  CREATININE 2.08* 1.83*  CALCIUM 8.5* 7.8*  MG  --  1.4*   GFR: CrCl cannot be calculated (Unknown ideal weight.). Liver Function Tests: Recent Labs  Lab 08/14/19 2044 08/15/19 0605  AST 22 21  ALT 21 19  ALKPHOS 54 44  BILITOT 0.7 0.4  PROT 6.6 5.4*  ALBUMIN 3.4* 2.9*   No results for input(s): LIPASE, AMYLASE in the last 168 hours. No results for input(s): AMMONIA in the last 168 hours. Coagulation Profile: No results for input(s): INR, PROTIME in the last 168 hours. Cardiac Enzymes: Recent Labs  Lab 08/15/19 0605  CKTOTAL 73   BNP (last 3 results) No results for input(s): PROBNP in the last 8760 hours. HbA1C: No results for input(s): HGBA1C in the last 72 hours. CBG: Recent Labs  Lab 08/15/19 0757 08/15/19 1223  GLUCAP 241* 172*   Lipid Profile: Recent Labs    08/14/19 2044  TRIG 103   Thyroid Function Tests: Recent Labs    08/15/19 0605  TSH 1.943   Anemia Panel: Recent Labs    08/14/19 2044 08/15/19 0605  FERRITIN 240 239   Sepsis Labs: Recent Labs  Lab 08/14/19 2044 08/14/19 2045 08/15/19 0349  PROCALCITON <0.10  --   --   LATICACIDVEN  --  1.6 0.8    Recent Results (from the past 240 hour(s))  Novel Coronavirus, NAA ([facility])     Status: Abnormal   Collection Time: 08/10/19  8:49 AM   Specimen: Nasopharyngeal(NP) swabs in vial transport medium   NASOPHARYNGE  TESTING  Result Value Ref Range Status   SARS-CoV-2, NAA  Detected (A) Not Detected Final    Comment: This nucleic acid amplification test was developed and its performance characteristics determined by [employer]. Nucleic acid amplification tests include PCR and TMA. This test has not been FDA cleared or approved. This test has been authorized by FDA under an Emergency Use Authorization (EUA). This test is only authorized for the duration of time the declaration that circumstances exist justifying the authorization of the emergency use of in vitro diagnostic tests for detection of SARS-CoV-2 virus and/or diagnosis of COVID-19 infection under section 564(b)(1) of the Act, 21 U.S.C. [id number]) (1), unless the authorization is terminated or revoked sooner. When diagnostic testing is negative, the possibility of a false negative result should be considered in the context of a patient's recent exposures and the presence of clinical signs and symptoms consistent with COVID-19. An individual without symptoms of COVID-19 and who is  not shedding SARS-CoV-2 virus would  expect to have a negative (not detected) result in this assay.          Radiology Studies: DG Chest Port 1 View  Result Date: 08/14/2019 CLINICAL DATA:  Shortness of breath EXAM: PORTABLE CHEST 1 VIEW COMPARISON:  05/18/2019 FINDINGS: The heart size and mediastinal contours are within normal limits. Both lungs are clear. The visualized skeletal structures are unremarkable. IMPRESSION: No active disease. Electronically Signed   By: Constance Holster M.D.   On: 08/14/2019 21:35        Scheduled Meds: . aspirin EC  81 mg Oral Daily  . atorvastatin  80 mg Oral Daily  . carvedilol  3.125 mg Oral BID WC  . enoxaparin (LOVENOX) injection  40 mg Subcutaneous Daily  . insulin aspart  0-9 Units Subcutaneous TID WC  . pantoprazole (PROTONIX) IV  40 mg Intravenous Q12H   Continuous Infusions: . sodium chloride 75 mL/hr at 08/15/19 0608  . [START ON 08/16/2019] remdesivir 100  mg in NS 100 mL       LOS: 0 days    Time spent: 35 minutes.     Elmarie Shiley, MD [facility]   If 7PM-7AM, please contact night-coverage [email or web address] Password [email or web address] 08/15/2019, 1:28 PM

## 2019-08-16 LAB — COMPREHENSIVE METABOLIC PANEL
ALT: 18 U/L (ref 0–44)
AST: 21 U/L (ref 15–41)
Albumin: 2.7 g/dL — ABNORMAL LOW (ref 3.5–5.0)
Alkaline Phosphatase: 46 U/L (ref 38–126)
Anion gap: 9 (ref 5–15)
BUN: 33 mg/dL — ABNORMAL HIGH (ref 8–23)
CO2: 20 mmol/L — ABNORMAL LOW (ref 22–32)
Calcium: 8.1 mg/dL — ABNORMAL LOW (ref 8.9–10.3)
Chloride: 109 mmol/L (ref 98–111)
Creatinine, Ser: 1.58 mg/dL — ABNORMAL HIGH (ref 0.61–1.24)
GFR calc Af Amer: 53 mL/min — ABNORMAL LOW (ref >60)
GFR calc non Af Amer: 46 mL/min — ABNORMAL LOW (ref >60)
Glucose, Bld: 101 mg/dL — ABNORMAL HIGH (ref 70–99)
Potassium: 3.5 mmol/L (ref 3.5–5.1)
Sodium: 138 mmol/L (ref 135–145)
Total Bilirubin: 0.2 mg/dL — ABNORMAL LOW (ref 0.3–1.2)
Total Protein: 5.3 g/dL — ABNORMAL LOW (ref 6.5–8.1)

## 2019-08-16 LAB — C-REACTIVE PROTEIN: CRP: 5.6 mg/dL — ABNORMAL HIGH (ref <1.0)

## 2019-08-16 LAB — GLUCOSE, CAPILLARY
Glucose-Capillary: 96 mg/dL (ref 70–99)
Glucose-Capillary: 89 mg/dL (ref 70–99)
Glucose-Capillary: 168 mg/dL — ABNORMAL HIGH (ref 70–99)
Glucose-Capillary: 123 mg/dL — ABNORMAL HIGH (ref 70–99)
Glucose-Capillary: 150 mg/dL — ABNORMAL HIGH (ref 70–99)

## 2019-08-16 LAB — D-DIMER, QUANTITATIVE: D-Dimer, Quant: 0.7 ug/mL-FEU — ABNORMAL HIGH (ref 0.00–0.50)

## 2019-08-16 LAB — CBG MONITORING, ED: Glucose-Capillary: 105 mg/dL — ABNORMAL HIGH (ref 70–99)

## 2019-08-16 MED ORDER — VITAMIN B-12 100 MCG PO TABS
100.0000 ug | ORAL_TABLET | Freq: Every day | ORAL | Status: DC
  Administered 2019-08-16 – 2019-08-19 (×4): 100 ug via ORAL
  Filled 2019-08-16 (×4): qty 1

## 2019-08-16 NOTE — Progress Notes (Addendum)
Patient admitted to unit from the ED. Alert and Oriented*4. RA. Vitals otherwise stable. SB 50's. Skin assessment * 2 RN: WDL. BS 105. Oriented to room/call bell and fall risk.    08/16/19 0150  Vitals  Temp 98.6 F (37 C)  Temp Source Oral  BP 132/72  MAP (mmHg) 90  BP Location Left Arm  BP Method Automatic  Patient Position (if appropriate) Lying  Pulse Rate 70  Pulse Rate Source Monitor  Cardiac Rhythm SB  Resp 18  Oxygen Therapy  SpO2 99 %  O2 Device Room Air  MEWS Score  MEWS RR 0  MEWS Pulse 0  MEWS Systolic 0  MEWS LOC 0  MEWS Temp 0  MEWS Score 0  MEWS Score Color Green

## 2019-08-16 NOTE — ED Notes (Signed)
ED TO INPATIENT HANDOFF REPORT  ED Nurse Name and Phone #:  [phone]  S Name/Age/Gender Jeffrey [name] 64 y.o. male Room/Bed: 014C/014C  Code Status   Code Status: Full Code  Home/SNF/Other Home Patient oriented to: self, place, time and situation Is this baseline? Yes   Triage Complete: Triage complete  Chief Complaint COVID-19 virus detected [U07.1] ARF (acute renal failure) ([place]) [N17.9]  Triage Note Patient came in via ems ; c/o feeling unwell; along w/ weakness. Reported patient tested + for covid today.     Allergies Allergies  Allergen Reactions  . Codeine Nausea And Vomiting  . Penicillins Rash    Did it involve swelling of the face/tongue/throat, SOB, or low BP? no Did it involve sudden or severe rash/hives, skin peeling, or any reaction on the inside of your mouth or nose? yes Did you need to seek medical attention at a hospital or doctor's office? yes When did it last happen?childhood If all above answers are "NO", may proceed with cephalosporin use.     Level of Care/Admitting Diagnosis ED Disposition    ED Disposition Condition Comment   Admit  Hospital Area: [facility] [[id number]]  Level of Care: Telemetry Medical [104]  Covid Evaluation: Confirmed COVID Positive  Diagnosis: ARF (acute renal failure) [facility]) [id number]  Admitting Physician: Rise Patience [phone]  Attending Physician: Rise Patience [phone]  Estimated length of stay: past midnight tomorrow  Certification:: I certify this patient will need inpatient services for at least 2 midnights       B Medical/Surgery History Past Medical History:  Diagnosis Date  . Cardiac arrest ([place]) 05/22/2016  . CHF (congestive heart failure) ([place])   . Diabetes mellitus without complication ([place])   . Diabetic retinopathy ([place])   . Hyperlipidemia   . Hypertension   . Myocardial infarct ([place]) 2105  . S/P primary angioplasty with coronary stent 05/22/2016  .  Vitamin B12 deficiency    Past Surgical History:  Procedure Laterality Date  . ANGIOPLASTY     2015  . REFRACTIVE SURGERY    . VITRECTOMY       A IV Location/Drains/Wounds Patient Lines/Drains/Airways Status   Active Line/Drains/Airways    Name:   Placement date:   Placement time:   Site:   Days:   Peripheral IV 08/14/19 Right Hand   08/14/19    2015    Hand   2          Intake/Output Last 24 hours  Intake/Output Summary (Last 24 hours) at 08/16/2019 0101 Last data filed at 08/15/2019 1214 Gross per 24 hour  Intake 50 ml  Output --  Net 50 ml    Labs/Imaging Results for orders placed or performed during the hospital encounter of 08/14/19 (from the past 48 hour(s))  Blood Culture (routine x 2)     Status: None (Preliminary result)   Collection Time: 08/14/19  8:15 PM   Specimen: BLOOD RIGHT HAND  Result Value Ref Range   Specimen Description BLOOD RIGHT HAND    Special Requests      BOTTLES DRAWN AEROBIC AND ANAEROBIC Blood Culture adequate volume   Culture      NO GROWTH < 24 HOURS Performed at [facility], [place] [address] [place], [place] [address]    Report Status PENDING   Blood Culture (routine x 2)     Status: None (Preliminary result)   Collection Time: 08/14/19  8:22 PM   Specimen: BLOOD  Result Value Ref  Range   Specimen Description BLOOD LEFT ANTECUBITAL    Special Requests      BOTTLES DRAWN AEROBIC AND ANAEROBIC Blood Culture adequate volume   Culture      NO GROWTH < 24 HOURS Performed at [facility], [place] [address] [place], [place] [address]    Report Status PENDING   CBC WITH DIFFERENTIAL     Status: Abnormal   Collection Time: 08/14/19  8:44 PM  Result Value Ref Range   WBC 5.4 4.0 - 10.5 K/uL   RBC 3.55 (L) 4.22 - 5.81 MIL/uL   Hemoglobin 11.0 (L) 13.0 - 17.0 g/dL   HCT 32.9 (L) 39.0 - 52.0 %   MCV 92.7 80.0 - 100.0 fL   MCH 31.0 26.0 - 34.0 pg   MCHC 33.4 30.0 - 36.0 g/dL   RDW 12.6 11.5 - 15.5 %   Platelets 275 150 - 400  K/uL   nRBC 0.0 0.0 - 0.2 %   Neutrophils Relative % 67 %   Neutro Abs 3.5 1.7 - 7.7 K/uL   Lymphocytes Relative 21 %   Lymphs Abs 1.1 0.7 - 4.0 K/uL   Monocytes Relative 12 %   Monocytes Absolute 0.7 0.1 - 1.0 K/uL   Eosinophils Relative 0 %   Eosinophils Absolute 0.0 0.0 - 0.5 K/uL   Basophils Relative 0 %   Basophils Absolute 0.0 0.0 - 0.1 K/uL   Immature Granulocytes 0 %   Abs Immature Granulocytes 0.02 0.00 - 0.07 K/uL    Comment: Performed at [facility] [address] [place], [place] [address]  Comprehensive metabolic panel     Status: Abnormal   Collection Time: 08/14/19  8:44 PM  Result Value Ref Range   Sodium 130 (L) 135 - 145 mmol/L   Potassium 3.8 3.5 - 5.1 mmol/L   Chloride 99 98 - 111 mmol/L   CO2 21 (L) 22 - 32 mmol/L   Glucose, Bld 265 (H) 70 - 99 mg/dL   BUN 41 (H) 8 - 23 mg/dL   Creatinine, Ser 2.08 (H) 0.61 - 1.24 mg/dL   Calcium 8.5 (L) 8.9 - 10.3 mg/dL   Total Protein 6.6 6.5 - 8.1 g/dL   Albumin 3.4 (L) 3.5 - 5.0 g/dL   AST 22 15 - 41 U/L   ALT 21 0 - 44 U/L   Alkaline Phosphatase 54 38 - 126 U/L   Total Bilirubin 0.7 0.3 - 1.2 mg/dL   GFR calc non Af Amer 33 (L) >60 mL/min   GFR calc Af Amer 38 (L) >60 mL/min   Anion gap 10 5 - 15    Comment: Performed at [facility], [place] [address], [place], [place] [address]  D-dimer, quantitative     Status: Abnormal   Collection Time: 08/14/19  8:44 PM  Result Value Ref Range   D-Dimer, Quant 0.70 (H) 0.00 - 0.50 ug/mL-FEU    Comment: (NOTE) At the manufacturer cut-off of 0.50 ug/mL FEU, this assay has been documented to exclude PE with a sensitivity and negative predictive value of 97 to 99%.  At this time, this assay has not been approved by the FDA to exclude DVT/VTE. Results should be correlated with clinical presentation. Performed at [facility], [place] [address] [place], [place] [address]   Procalcitonin     Status: None   Collection Time: 08/14/19  8:44 PM  Result Value Ref  Range   Procalcitonin <0.10 ng/mL  Comment:        Interpretation: PCT (Procalcitonin) <= 0.5 ng/mL: Systemic infection (sepsis) is not likely. Local bacterial infection is possible. (NOTE)       Sepsis PCT Algorithm           Lower Respiratory Tract                                      Infection PCT Algorithm    ----------------------------     ----------------------------         PCT < 0.25 ng/mL                PCT < 0.10 ng/mL         Strongly encourage             Strongly discourage   discontinuation of antibiotics    initiation of antibiotics    ----------------------------     -----------------------------       PCT 0.25 - 0.50 ng/mL            PCT 0.10 - 0.25 ng/mL               OR       >80% decrease in PCT            Discourage initiation of                                            antibiotics      Encourage discontinuation           of antibiotics    ----------------------------     -----------------------------         PCT >= 0.50 ng/mL              PCT 0.26 - 0.50 ng/mL               AND        <80% decrease in PCT             Encourage initiation of                                             antibiotics       Encourage continuation           of antibiotics    ----------------------------     -----------------------------        PCT >= 0.50 ng/mL                  PCT > 0.50 ng/mL               AND         increase in PCT                  Strongly encourage                                      initiation of antibiotics    Strongly encourage escalation           of antibiotics                                     -----------------------------  PCT <= 0.25 ng/mL                                                 OR                                        > 80% decrease in PCT                                     Discontinue / Do not initiate                                             antibiotics Performed at [facility], [place] [address] [place], [place] [address]   Lactate dehydrogenase     Status: None   Collection Time: 08/14/19  8:44 PM  Result Value Ref Range   LDH 149 98 - 192 U/L    Comment: Performed at [facility], [place] [address], [place], [place] [address]  Ferritin     Status: None   Collection Time: 08/14/19  8:44 PM  Result Value Ref Range   Ferritin 240 24 - 336 ng/mL    Comment: Performed at [facility], [place] [address] [place], [place] [address]  Fibrinogen     Status: None   Collection Time: 08/14/19  8:44 PM  Result Value Ref Range   Fibrinogen 472 210 - 475 mg/dL    Comment: Performed at [facility] [address], [place], [place] [address]  C-reactive protein     Status: Abnormal   Collection Time: 08/14/19  8:44 PM  Result Value Ref Range   CRP 4.4 (H) <1.0 mg/dL    Comment: Performed at [facility], [place] [address], [place], [place] [address]  Brain natriuretic peptide     Status: None   Collection Time: 08/14/19  8:44 PM  Result Value Ref Range   B Natriuretic Peptide 70.8 0.0 - 100.0 pg/mL    Comment: Performed at [facility] [address], [place], [place] [address]  Triglycerides     Status: None   Collection Time: 08/14/19  8:44 PM  Result Value Ref Range   Triglycerides 103 <150 mg/dL    Comment: Performed at [facility] [address], [place], [place] [address]  TSH     Status: None   Collection Time: 08/14/19  8:44 PM  Result Value Ref Range   TSH 1.774 0.350 - 4.500 uIU/mL    Comment: Performed by a 3rd Generation assay with a functional sensitivity of <=0.01 uIU/mL. Performed at [facility], [place] [address], [place], [place] [address]   Lactic acid, plasma     Status: None   Collection Time: 08/14/19  8:45 PM  Result Value Ref Range   Lactic Acid, Venous 1.6 0.5 - 1.9 mmol/L    Comment: Performed at [facility] [address], [place], [place] [address]  Lactic acid, plasma     Status:  None  Collection Time: 08/15/19  3:49 AM  Result Value Ref Range   Lactic Acid, Venous 0.8 0.5 - 1.9 mmol/L    Comment: Performed at [facility], [place] [address], [place], [place] [address]  HIV Antibody (routine testing w rflx)     Status: None   Collection Time: 08/15/19  6:05 AM  Result Value Ref Range   HIV Screen 4th Generation wRfx NON REACTIVE NON REACTIVE    Comment: Performed at [facility] [address] [place], [place] [address]  TSH     Status: None   Collection Time: 08/15/19  6:05 AM  Result Value Ref Range   TSH 1.943 0.350 - 4.500 uIU/mL    Comment: Performed by a 3rd Generation assay with a functional sensitivity of <=0.01 uIU/mL. Performed at [facility], [place] [address] [place], [place] [address]   CBC WITH DIFFERENTIAL     Status: Abnormal   Collection Time: 08/15/19  6:05 AM  Result Value Ref Range   WBC 4.2 4.0 - 10.5 K/uL   RBC 3.27 (L) 4.22 - 5.81 MIL/uL   Hemoglobin 10.2 (L) 13.0 - 17.0 g/dL   HCT 30.4 (L) 39.0 - 52.0 %   MCV 93.0 80.0 - 100.0 fL   MCH 31.2 26.0 - 34.0 pg   MCHC 33.6 30.0 - 36.0 g/dL   RDW 12.6 11.5 - 15.5 %   Platelets 254 150 - 400 K/uL   nRBC 0.0 0.0 - 0.2 %   Neutrophils Relative % 60 %   Neutro Abs 2.5 1.7 - 7.7 K/uL   Lymphocytes Relative 30 %   Lymphs Abs 1.3 0.7 - 4.0 K/uL   Monocytes Relative 10 %   Monocytes Absolute 0.4 0.1 - 1.0 K/uL   Eosinophils Relative 0 %   Eosinophils Absolute 0.0 0.0 - 0.5 K/uL   Basophils Relative 0 %   Basophils Absolute 0.0 0.0 - 0.1 K/uL   Immature Granulocytes 0 %   Abs Immature Granulocytes 0.01 0.00 - 0.07 K/uL    Comment: Performed at [facility], [address] [place], [place] [address]  Comprehensive metabolic panel     Status: Abnormal   Collection Time: 08/15/19  6:05 AM  Result Value Ref Range   Sodium 135 135 - 145 mmol/L   Potassium 3.8 3.5 - 5.1 mmol/L   Chloride 104 98 - 111 mmol/L   CO2 20 (L) 22 - 32 mmol/L   Glucose, Bld 286 (H) 70 - 99  mg/dL   BUN 37 (H) 8 - 23 mg/dL   Creatinine, Ser 1.83 (H) 0.61 - 1.24 mg/dL   Calcium 7.8 (L) 8.9 - 10.3 mg/dL   Total Protein 5.4 (L) 6.5 - 8.1 g/dL   Albumin 2.9 (L) 3.5 - 5.0 g/dL   AST 21 15 - 41 U/L   ALT 19 0 - 44 U/L   Alkaline Phosphatase 44 38 - 126 U/L   Total Bilirubin 0.4 0.3 - 1.2 mg/dL   GFR calc non Af Amer 38 (L) >60 mL/min   GFR calc Af Amer 44 (L) >60 mL/min   Anion gap 11 5 - 15    Comment: Performed at [facility], [place] [address], [place], [place] [address]  Magnesium     Status: Abnormal   Collection Time: 08/15/19  6:05 AM  Result Value Ref Range   Magnesium 1.4 (L) 1.7 - 2.4 mg/dL    Comment: Performed at [facility], [address] [place], [place] [address]  CK     Status: None   Collection Time: 08/15/19  6:05 AM  Result Value Ref Range   Total CK 73 49 - 397 U/L    Comment: Performed at [facility], [place] [address] [place], [place] [address]  C-reactive protein     Status: Abnormal   Collection Time: 08/15/19  6:05 AM  Result Value Ref Range   CRP 4.0 (H) <1.0 mg/dL    Comment: Performed at [facility] [address], [place], [place] [address]  Ferritin     Status: None   Collection Time: 08/15/19  6:05 AM  Result Value Ref Range   Ferritin 239 24 - 336 ng/mL    Comment: Performed at [facility] [address], [place], [place] [address]  Troponin I (High Sensitivity)     Status: Abnormal   Collection Time: 08/15/19  6:05 AM  Result Value Ref Range   Troponin I (High Sensitivity) 24 (H) <18 ng/L    Comment: (NOTE) Elevated high sensitivity troponin I (hsTnI) values and significant  changes across serial measurements may suggest ACS but many other  chronic and acute conditions are known to elevate hsTnI results.  Refer to the "Links" section for chest pain algorithms and additional  guidance. Performed at [facility], [place] [address] [place], [place] [address]   D-dimer, quantitative (not at [facility])      Status: Abnormal   Collection Time: 08/15/19  6:05 AM  Result Value Ref Range   D-Dimer, Quant 0.67 (H) 0.00 - 0.50 ug/mL-FEU    Comment: (NOTE) At the manufacturer cut-off of 0.50 ug/mL FEU, this assay has been documented to exclude PE with a sensitivity and negative predictive value of 97 to 99%.  At this time, this assay has not been approved by the FDA to exclude DVT/VTE. Results should be correlated with clinical presentation. Performed at [facility], [place] [address] [place], [place] [address]   CBG monitoring, ED     Status: Abnormal   Collection Time: 08/15/19  7:57 AM  Result Value Ref Range   Glucose-Capillary 241 (H) 70 - 99 mg/dL   Comment 1 Notify RN    Comment 2 Document in Chart   Troponin I (High Sensitivity)     Status: Abnormal   Collection Time: 08/15/19  8:23 AM  Result Value Ref Range   Troponin I (High Sensitivity) 25 (H) <18 ng/L    Comment: (NOTE) Elevated high sensitivity troponin I (hsTnI) values and significant  changes across serial measurements may suggest ACS but many other  chronic and acute conditions are known to elevate hsTnI results.  Refer to the "Links" section for chest pain algorithms and additional  guidance. Performed at [facility], [place] [address] [place],  [address]   CBG monitoring, ED     Status: Abnormal   Collection Time: 08/15/19 12:23 PM  Result Value Ref Range   Glucose-Capillary 172 (H) 70 - 99 mg/dL  CBG monitoring, ED     Status: Abnormal   Collection Time: 08/15/19  6:31 PM  Result Value Ref Range   Glucose-Capillary 140 (H) 70 - 99 mg/dL  CBG monitoring, ED     Status: Abnormal   Collection Time: 08/16/19 12:49 AM  Result Value Ref Range   Glucose-Capillary 105 (H) 70 - 99 mg/dL   DG Chest Port 1 View  Result Date: 08/14/2019  CLINICAL DATA:  Shortness of breath EXAM: PORTABLE CHEST 1 VIEW COMPARISON:  05/18/2019 FINDINGS: The heart size and mediastinal contours are within normal limits. Both  lungs are clear. The visualized skeletal structures are unremarkable. IMPRESSION: No active disease. Electronically Signed   By: Constance Holster M.D.   On: 08/14/2019 21:35    Pending Labs Unresulted Labs (From admission, onward)    Start     Ordered   08/22/19 0500  Creatinine, serum  (enoxaparin (LOVENOX)    CrCl >/= 30 ml/min)  Weekly,   R    Comments: while on enoxaparin therapy    08/15/19 0538   08/16/19 0500  C-reactive protein  Daily,   R     08/15/19 1340   08/16/19 0500  Comprehensive metabolic panel  Daily,   R     08/15/19 1340   08/16/19 0500  D-dimer, quantitative (not at [facility])  Daily,   R     08/15/19 1340   08/15/19 0535  CBC  (enoxaparin (LOVENOX)    CrCl >/= 30 ml/min)  Once,   STAT    Comments: Baseline for enoxaparin therapy IF NOT ALREADY DRAWN.  Notify MD if PLT < 100 K.    08/15/19 0538          Vitals/Pain Today's Vitals   08/16/19 0000 08/16/19 0015 08/16/19 0030 08/16/19 0045  BP: 118/62 114/60 (!) 123/58 (!) 121/59  Pulse: 70 64 81 70  Resp: (!) 25 (!) 25 17 (!) 24  Temp:      TempSrc:      SpO2: 97% 95% 100% 98%  Height:      PainSc:        Isolation Precautions Airborne and Contact precautions  Medications Medications  remdesivir 200 mg in sodium chloride 0.9% 250 mL IVPB (0 mg Intravenous Stopped 08/15/19 0035)    Followed by  remdesivir 100 mg in sodium chloride 0.9 % 100 mL IVPB (has no administration in time range)  aspirin EC tablet 81 mg (81 mg Oral Given 08/15/19 1104)  atorvastatin (LIPITOR) tablet 80 mg (80 mg Oral Given 08/15/19 1104)  carvedilol (COREG) tablet 3.125 mg (3.125 mg Oral Not Given 08/15/19 2058)  acetaminophen (TYLENOL) tablet 650 mg (has no administration in time range)    Or  acetaminophen (TYLENOL) suppository 650 mg (has no administration in time range)  ondansetron (ZOFRAN) tablet 4 mg (has no administration in time range)    Or  ondansetron (ZOFRAN) injection 4 mg (has no administration in time range)   insulin aspart (novoLOG) injection 0-9 Units (1 Units Subcutaneous Given 08/15/19 1904)  enoxaparin (LOVENOX) injection 40 mg (40 mg Subcutaneous Given 08/15/19 1104)  0.9 %  sodium chloride infusion ( Intravenous Stopped 08/15/19 2150)  pantoprazole (PROTONIX) injection 40 mg (40 mg Intravenous Given 08/15/19 2151)  insulin glargine (LANTUS) injection 10 Units (10 Units Subcutaneous Given 08/15/19 1904)  sodium chloride 0.9 % bolus 1,000 mL (0 mLs Intravenous Stopped 08/14/19 2316)  magnesium sulfate IVPB 2 g 50 mL (0 g Intravenous Stopped 08/15/19 1214)    Mobility walks with person assist Moderate fall risk   Focused Assessments Pulmonary Assessment Handoff:  Lung sounds:   O2 Device: Room Air        R Recommendations: See Admitting Provider Note  Report given to:   Additional Notes: -

## 2019-08-16 NOTE — Progress Notes (Signed)
PROGRESS NOTE    Jeffrey [name]  [id number] DOB: 09/06/1955 DOA: 08/14/2019 PCP: Vivi Barrack, MD   Brief Narrative:  Jeffrey [name] is a 64 y.o. male with history of CAD status post stenting with history of cardiac arrest, diastolic dysfunction last EF measured was in December 2020 with EF of 50 to 55% diabetes mellitus type 2 chronic kidney disease stage III baseline creatinine around 1.4 has been experiencing increasing fatigue and weakness over the last 3 weeks.  Denies any chest pain but has been having poor appetite with some nausea denies vomiting.  Patient on December 30 had worsening symptoms and at that time got check for COVID-19 results of which came yesterday. Which was positive.   Evaluation in the ED, patient was afebrile no hypoxemia.  Creatinine increased to 2.0.  Patient dehydrated unable to ambulate because of weakness.  Chest x-ray unremarkable.  Assessment & Plan:   Active Problems:   T2DM (type 2 diabetes mellitus) ([place])   Hypertension associated with diabetes ([place])   CAD S/P percutaneous coronary angioplasty   Hyperlipidemia associated with type 2 diabetes mellitus ([place])   Chronic combined systolic and diastolic heart failure ([place])   COVID-19 virus detected   AKI (acute kidney injury) ([place])   1-Acute Renal Failure on Chronic kidney Disease Stage III: Prior creatinine per records 1.4.  Patient presented with a creatinine of 2.0. In the setting of poor oral intake hypovolemia. Slightly improved to 1.8.  Patient will benefit of continuation of IV fluids. Agree with holding Lasix, spironolactone and Cozaar. Cr trending down, now at 1.58.  2-COVID-19 viral illness: Patient presented with dehydration, poor oral intake, weakness. Was a started on Remdesivir, Day 2. If patient developed hypoxemia will need to start IV dexamethasone. COVID-19 Labs  Recent Labs    08/14/19 2044 08/15/19 0605 08/16/19 0709  DDIMER 0.70* 0.67* 0.70*  FERRITIN 240 239  --     LDH 149  --   --   CRP 4.4* 4.0* 5.6*    Lab Results  Component Value Date   SARSCOV2NAA Detected (A) 08/10/2019   3-DM type II: Blood sugar more than 200, resume low-dose Lantus. Continue with a sliding scale insulin.  4-Gastritis: Patient report nausea, burping. Started  IV Protonix.  5-CAD status post stenting: Resume aspirin and statins. Mild elevation of cardiac markers in the setting of renal failure. Denies chest pain.  Chronic anemia: Follow trend. Repeat cbc tomorrow.   Hypomagnesemia; received IV magnesium. Repeat level.   Estimated body mass index is 22.78 kg/m as calculated from the following:   Height as of this encounter: 5\' 10"  (1.778 m).   Weight as of this encounter: 72 kg.   DVT prophylaxis: Lovenox Code Status: Full code Family Communication: Care discussed with patient Disposition Plan: Remain in the hospital to receive IV hydration, will need physical therapist evaluation, and Remdesivir.  Consultants:   none  Procedures:   none  Antimicrobials:    Subjective: He report feeling a little better, but feels very weak.  Report one episode of loose stool in the morning.  He doesn't walk a lot, he has chronic LE weakness. He use  a cane at home  Objective: Vitals:   08/16/19 0234 08/16/19 0400 08/16/19 0800 08/16/19 1017  BP:  126/68 131/67 124/62  Pulse:  75  78  Resp:  18 (!) 22 15  Temp:  98.7 F (37.1 C)    TempSrc:  Oral    SpO2:  98%  97%  Weight:  72 kg     Height: 5\' 10"  (1.778 m)       Intake/Output Summary (Last 24 hours) at 08/16/2019 1543 Last data filed at 08/16/2019 0955 Gross per 24 hour  Intake 240 ml  Output 300 ml  Net -60 ml   Filed Weights   08/16/19 0234  Weight: 72 kg    Examination:  General exam: NAD Respiratory system: CTA Cardiovascular system: S 1, S 2 RRR Gastrointestinal system: BS present, soft, nt Central nervous system: non focal.  Extremities: Symmetric power Skin: No rashes   Data  Reviewed: I have personally reviewed following labs and imaging studies  CBC: Recent Labs  Lab 08/14/19 2044 08/15/19 0605  WBC 5.4 4.2  NEUTROABS 3.5 2.5  HGB 11.0* 10.2*  HCT 32.9* 30.4*  MCV 92.7 93.0  PLT 275 0000000   Basic Metabolic Panel: Recent Labs  Lab 08/14/19 2044 08/15/19 0605 08/16/19 0709  NA 130* 135 138  K 3.8 3.8 3.5  CL 99 104 109  CO2 21* 20* 20*  GLUCOSE 265* 286* 101*  BUN 41* 37* 33*  CREATININE 2.08* 1.83* 1.58*  CALCIUM 8.5* 7.8* 8.1*  MG  --  1.4*  --    GFR: Estimated Creatinine Clearance: 48.1 mL/min (A) (by C-G formula based on SCr of 1.58 mg/dL (H)). Liver Function Tests: Recent Labs  Lab 08/14/19 2044 08/15/19 0605 08/16/19 0709  AST 22 21 21   ALT 21 19 18   ALKPHOS 54 44 46  BILITOT 0.7 0.4 0.2*  PROT 6.6 5.4* 5.3*  ALBUMIN 3.4* 2.9* 2.7*   No results for input(s): LIPASE, AMYLASE in the last 168 hours. No results for input(s): AMMONIA in the last 168 hours. Coagulation Profile: No results for input(s): INR, PROTIME in the last 168 hours. Cardiac Enzymes: Recent Labs  Lab 08/15/19 0605  CKTOTAL 73   BNP (last 3 results) No results for input(s): PROBNP in the last 8760 hours. HbA1C: No results for input(s): HGBA1C in the last 72 hours. CBG: Recent Labs  Lab 08/15/19 1831 08/16/19 0049 08/16/19 0638 08/16/19 0806 08/16/19 1140  GLUCAP 140* 105* 96 89 168*   Lipid Profile: Recent Labs    08/14/19 2044  TRIG 103   Thyroid Function Tests: Recent Labs    08/15/19 0605  TSH 1.943   Anemia Panel: Recent Labs    08/14/19 2044 08/15/19 0605  FERRITIN 240 239   Sepsis Labs: Recent Labs  Lab 08/14/19 2044 08/14/19 2045 08/15/19 0349  PROCALCITON <0.10  --   --   LATICACIDVEN  --  1.6 0.8    Recent Results (from the past 240 hour(s))  Novel Coronavirus, NAA ([facility])     Status: Abnormal   Collection Time: 08/10/19  8:49 AM   Specimen: Nasopharyngeal(NP) swabs in vial transport medium   NASOPHARYNGE   TESTING  Result Value Ref Range Status   SARS-CoV-2, NAA Detected (A) Not Detected Final    Comment: This nucleic acid amplification test was developed and its performance characteristics determined by [employer]. Nucleic acid amplification tests include PCR and TMA. This test has not been FDA cleared or approved. This test has been authorized by FDA under an Emergency Use Authorization (EUA). This test is only authorized for the duration of time the declaration that circumstances exist justifying the authorization of the emergency use of in vitro diagnostic tests for detection of SARS-CoV-2 virus and/or diagnosis of COVID-19 infection under section 564(b)(1) of the Act, 21 U.S.C. [id number]) (1), unless the authorization is  terminated or revoked sooner. When diagnostic testing is negative, the possibility of a false negative result should be considered in the context of a patient's recent exposures and the presence of clinical signs and symptoms consistent with COVID-19. An individual without symptoms of COVID-19 and who is not shedding SARS-CoV-2 virus would  expect to have a negative (not detected) result in this assay.   Blood Culture (routine x 2)     Status: None (Preliminary result)   Collection Time: 08/14/19  8:15 PM   Specimen: BLOOD RIGHT HAND  Result Value Ref Range Status   Specimen Description BLOOD RIGHT HAND  Final   Special Requests   Final    BOTTLES DRAWN AEROBIC AND ANAEROBIC Blood Culture adequate volume   Culture   Final    NO GROWTH 2 DAYS Performed at [facility], [place] [address], [place], [place] [address]    Report Status PENDING  Incomplete  Blood Culture (routine x 2)     Status: None (Preliminary result)   Collection Time: 08/14/19  8:22 PM   Specimen: BLOOD  Result Value Ref Range Status   Specimen Description BLOOD LEFT ANTECUBITAL  Final   Special Requests   Final    BOTTLES DRAWN AEROBIC AND ANAEROBIC Blood Culture adequate  volume   Culture   Final    NO GROWTH 2 DAYS Performed at [facility], [place] [address] [place], [place] [address]    Report Status PENDING  Incomplete         Radiology Studies: DG Chest Port 1 View  Result Date: 08/14/2019 CLINICAL DATA:  Shortness of breath EXAM: PORTABLE CHEST 1 VIEW COMPARISON:  05/18/2019 FINDINGS: The heart size and mediastinal contours are within normal limits. Both lungs are clear. The visualized skeletal structures are unremarkable. IMPRESSION: No active disease. Electronically Signed   By: Constance Holster M.D.   On: 08/14/2019 21:35        Scheduled Meds: . aspirin EC  81 mg Oral Daily  . atorvastatin  80 mg Oral Daily  . carvedilol  3.125 mg Oral BID WC  . enoxaparin (LOVENOX) injection  40 mg Subcutaneous Daily  . insulin aspart  0-9 Units Subcutaneous TID WC  . insulin glargine  10 Units Subcutaneous Daily  . pantoprazole (PROTONIX) IV  40 mg Intravenous Q12H   Continuous Infusions: . remdesivir 100 mg in NS 100 mL 100 mg (08/16/19 1033)     LOS: 1 day    Time spent: 35 minutes.     Elmarie Shiley, MD [facility]   If 7PM-7AM, please contact night-coverage [email or web address] Password [facility] 08/16/2019, 3:43 PM

## 2019-08-17 ENCOUNTER — Other Ambulatory Visit: Payer: Self-pay

## 2019-08-17 DIAGNOSIS — I10 Essential (primary) hypertension: Secondary | ICD-10-CM

## 2019-08-17 DIAGNOSIS — E1159 Type 2 diabetes mellitus with other circulatory complications: Secondary | ICD-10-CM

## 2019-08-17 LAB — VITAMIN D 25 HYDROXY (VIT D DEFICIENCY, FRACTURES): Vit D, 25-Hydroxy: 17.41 ng/mL — ABNORMAL LOW (ref 30–100)

## 2019-08-17 LAB — GLUCOSE, CAPILLARY
Glucose-Capillary: 124 mg/dL — ABNORMAL HIGH (ref 70–99)
Glucose-Capillary: 123 mg/dL — ABNORMAL HIGH (ref 70–99)
Glucose-Capillary: 249 mg/dL — ABNORMAL HIGH (ref 70–99)
Glucose-Capillary: 122 mg/dL — ABNORMAL HIGH (ref 70–99)
Glucose-Capillary: 113 mg/dL — ABNORMAL HIGH (ref 70–99)

## 2019-08-17 LAB — CBC
HCT: 33.2 % — ABNORMAL LOW (ref 39.0–52.0)
Hemoglobin: 11.1 g/dL — ABNORMAL LOW (ref 13.0–17.0)
MCH: 30.7 pg (ref 26.0–34.0)
MCHC: 33.4 g/dL (ref 30.0–36.0)
MCV: 92 fL (ref 80.0–100.0)
Platelets: 336 10*3/uL (ref 150–400)
RBC: 3.61 MIL/uL — ABNORMAL LOW (ref 4.22–5.81)
RDW: 12.7 % (ref 11.5–15.5)
WBC: 7.3 10*3/uL (ref 4.0–10.5)
nRBC: 0 % (ref 0.0–0.2)

## 2019-08-17 LAB — COMPREHENSIVE METABOLIC PANEL
ALT: 20 U/L (ref 0–44)
AST: 28 U/L (ref 15–41)
Albumin: 3.1 g/dL — ABNORMAL LOW (ref 3.5–5.0)
Alkaline Phosphatase: 60 U/L (ref 38–126)
Anion gap: 7 (ref 5–15)
BUN: 37 mg/dL — ABNORMAL HIGH (ref 8–23)
CO2: 21 mmol/L — ABNORMAL LOW (ref 22–32)
Calcium: 8.2 mg/dL — ABNORMAL LOW (ref 8.9–10.3)
Chloride: 109 mmol/L (ref 98–111)
Creatinine, Ser: 1.71 mg/dL — ABNORMAL HIGH (ref 0.61–1.24)
GFR calc Af Amer: 48 mL/min — ABNORMAL LOW (ref >60)
GFR calc non Af Amer: 41 mL/min — ABNORMAL LOW (ref >60)
Glucose, Bld: 248 mg/dL — ABNORMAL HIGH (ref 70–99)
Potassium: 4.2 mmol/L (ref 3.5–5.1)
Sodium: 137 mmol/L (ref 135–145)
Total Bilirubin: 0.5 mg/dL (ref 0.3–1.2)
Total Protein: 6.5 g/dL (ref 6.5–8.1)

## 2019-08-17 LAB — D-DIMER, QUANTITATIVE: D-Dimer, Quant: 0.52 ug/mL-FEU — ABNORMAL HIGH (ref 0.00–0.50)

## 2019-08-17 LAB — C-REACTIVE PROTEIN: CRP: 6 mg/dL — ABNORMAL HIGH (ref <1.0)

## 2019-08-17 LAB — MAGNESIUM: Magnesium: 2 mg/dL (ref 1.7–2.4)

## 2019-08-17 NOTE — Evaluation (Signed)
Physical Therapy Evaluation Patient Details Name: Jeffrey [name] MRN: [id number] DOB: 09/14/1955 Today's Date: 08/17/2019   History of Present Illness  64 yo male admitted COVID + with weakness nausea and vomitting with dehydration PMH Cardiac arrest, CHF, DM diabetic retinopathy HLD HTN MI s/p primary angioplasty with coronary stent 2017 vitamin B12 deficiency refractive surgery angioplasty 2015 CKD III  Clinical Impression   Pt admitted with above diagnosis. Patient has prior history of imbalance and muscle wasting (has seen GI for 30# unintentional wt loss--per pt). Has been using a quad cane, but agrees he now needs to use a RW (although he is very fixated on the fact his brother's furniture will not be rearranged). Prior ?anoxic brain injury from cardiac arrest (per pt's description) has him very anxious and even agitated and screaming at PT when trying to instruct him in safe use of RW. He later calmed down and apologized and agreed to have HHPT come and assess him at home.  Pt currently with functional limitations due to the deficits listed below (see PT Problem List). Pt will benefit from skilled PT to increase their independence and safety with mobility to allow discharge to the venue listed below.       Follow Up Recommendations Home health PT;Supervision/Assistance - 24 hour    Equipment Recommendations  Rolling walker with 5" wheels    Recommendations for Other Services       Precautions / Restrictions Precautions Precautions: Fall Precaution Comments: near falls; sudden outburst      Mobility  Bed Mobility Overal bed mobility: Modified Independent             General bed mobility comments: incr effort time; no rail, HOB 0  Transfers Overall transfer level: Needs assistance Equipment used: Rolling walker (2 wheeled) Transfers: Sit to/from Stand Sit to Stand: Min assist         General transfer comment: pt trying to pull up on RW; as PT instructed to push off  the bed he had an outburst "I can't push off this f'g mattress. I'm not an imbecile' He had strong posterior lean with bracing legs against the bed for leverage   Ambulation/Gait Ambulation/Gait assistance: Min guard Gait Distance (Feet): 40 Feet Assistive device: Rolling walker (2 wheeled) Gait Pattern/deviations: Step-through pattern;Decreased stride length;Trunk flexed Gait velocity: decr   General Gait Details: pushing RW too far ahead (did make minor adjustment when instructed, despite outburst)  Stairs            Wheelchair Mobility    Modified Rankin (Stroke Patients Only)       Balance Overall balance assessment: Needs assistance Sitting-balance support: No upper extremity supported;Feet unsupported Sitting balance-Leahy Scale: Good     Standing balance support: Bilateral upper extremity supported Standing balance-Leahy Scale: Poor                               Pertinent Vitals/Pain Pain Assessment: No/denies pain    Home Living Family/patient expects to be discharged to:: Private residence Living Arrangements: Other relatives(brother, also COVID) Available Help at Discharge: Family Type of Home: House Home Access: Level entry     Home Layout: One level Home Equipment: Cane - single point;Cane - quad      Prior Function Level of Independence: Independent with assistive device(s)         Comments: Uses quad cane all the time; fell outside in yard this summer--just went down ? due  to dehydration; on disabilty since 2015--cardiac arrest; brother does all the driving/shopping     Hand Dominance        Extremity/Trunk Assessment   Upper Extremity Assessment Upper Extremity Assessment: Defer to OT evaluation    Lower Extremity Assessment Lower Extremity Assessment: Generalized weakness(pt reports working up for 30# unintentional wt loss)    Cervical / Trunk Assessment Cervical / Trunk Assessment: Normal  Communication    Communication: No difficulties  Cognition Arousal/Alertness: Awake/alert Behavior During Therapy: Anxious;Agitated(1 episode/outburst (later apologized)) Overall Cognitive Status: History of cognitive impairments - at baseline(s/p cardiac arrest 2015; does his own meds, bills-triple chk)                                        General Comments General comments (skin integrity, edema, etc.): HR 70-80s; sats high 90s throughout; incr time due to pt veers off topic; very anxious re: hospitalization and not knowing what's going on "they didn't even tell me PT was coming!"    Exercises     Assessment/Plan    PT Assessment Patient needs continued PT services  PT Problem List Decreased strength;Decreased activity tolerance;Decreased balance;Decreased mobility;Decreased cognition;Decreased knowledge of use of DME;Decreased safety awareness       PT Treatment Interventions DME instruction;Gait training;Functional mobility training;Therapeutic activities;Balance training;Cognitive remediation;Patient/family education    PT Goals (Current goals can be found in the Care Plan section)  Acute Rehab PT Goals Patient Stated Goal: return home to live with his brother PT Goal Formulation: With patient Time For Goal Achievement: 08/31/19 Potential to Achieve Goals: Fair    Frequency Min 3X/week   Barriers to discharge Decreased caregiver support brother is hospitalized at [facility] PT "6 Clicks" Mobility  Outcome Measure Help needed turning from your back to your side while in a flat bed without using bedrails?: None Help needed moving from lying on your back to sitting on the side of a flat bed without using bedrails?: None Help needed moving to and from a bed to a chair (including a wheelchair)?: A Little Help needed standing up from a chair using your arms (e.g., wheelchair or bedside chair)?: A Little Help needed to walk in hospital  room?: A Little Help needed climbing 3-5 steps with a railing? : A Little 6 Click Score: 20    End of Session Equipment Utilized During Treatment: Gait belt Activity Tolerance: Treatment limited secondary to agitation Patient left: in chair;with call bell/phone within reach;with chair alarm set Nurse Communication: Mobility status;Other (comment)(outburst) PT Visit Diagnosis: Unsteadiness on feet (R26.81);History of falling (Z91.81);Muscle weakness (generalized) (M62.81)    Time: [id number] PT Time Calculation (min) (ACUTE ONLY): 44 min   Charges:   PT Evaluation $PT Eval Moderate Complexity: 1 Mod PT Treatments $Therapeutic Activity: 8-22 mins $Self Care/Home Management: 8-22         Arby Barrette, PT Pager [phone]   Rexanne Mano 08/17/2019, 11:13 AM

## 2019-08-17 NOTE — Consult Note (Signed)
   [facility] Inpatient Consult   08/17/2019  Jeffrey [name] 02/24/1956 [id number]   Patient is assigned with [facility] for chronic disease management services in the [employer] [facility] plan..  Patient has been assigned to a [place].    Patient will receive a post hospital call and will be evaluated for assessments and disease process education.    Chart review per MD notes as follows reveals patient is [from1/12/2019 64 Dr. Tyrell Antonio - Brief Narrative notes:  Jeffrey [name] a 64 y.o.malewithhistory of CAD status post stenting with history of cardiac arrest, diastolic dysfunction last EF measured was in December 2020 with EF of 50 to 55% diabetes mellitus type 2 chronic kidney disease stage III baseline creatinine around 1.4 has been experiencing increasing fatigue and weakness over the last 3 weeks. Denies any chest pain but has been having poor appetite with some nausea denies vomiting.Patient on December 30 had worsening symptoms and at that time got check for COVID-19 results of which came yesterday. Which was positive.   Plan: Will follow for progress and needs at disposition and with  Inpatient Transition Of Care [TOC] team member to make aware that [facility] following.   Of note, [facility] Care Management services does not replace or interfere with any services that are needed or arranged by inpatient [facility] care management team.  For additional questions or referrals please contact:  Natividad Brood, RN BSN [facility] Liaison  [phone] business mobile phone Toll free office [phone]  Fax number: [phone] [email or web address]

## 2019-08-17 NOTE — Progress Notes (Signed)
PROGRESS NOTE    Jeffrey [name]  [id number] DOB: [birth year] DOA: 08/14/2019 PCP: Vivi Barrack, MD   Brief Narrative:  Jeffrey [name] is a 64 y.o. male with history of CAD status post stenting with history of cardiac arrest, diastolic dysfunction last EF measured was in December 2020 with EF of 50 to 55% diabetes mellitus type 2 chronic kidney disease stage III baseline creatinine around 1.4 has been experiencing increasing fatigue and weakness over the last 3 weeks.  Denies any chest pain but has been having poor appetite with some nausea denies vomiting.  Patient on December 30 had worsening symptoms and at that time got check for COVID-19 results of which came yesterday. Which was positive.   Evaluation in the ED, patient was afebrile no hypoxemia.  Creatinine increased to 2.0.  Patient dehydrated unable to ambulate because of weakness.  Chest x-ray unremarkable.  Assessment & Plan:   Active Problems:   T2DM (type 2 diabetes mellitus) ([place])   Hypertension associated with diabetes ([place])   CAD S/P percutaneous coronary angioplasty   Hyperlipidemia associated with type 2 diabetes mellitus ([place])   Chronic combined systolic and diastolic heart failure ([place])   COVID-19 virus detected   AKI (acute kidney injury) ([place])   1-Acute Renal Failure on Chronic kidney Disease Stage III: Prior creatinine per records 1.4.  Patient presented with a creatinine of 2.0. In the setting of poor oral intake hypovolemia. Slightly improved to 1.7.  Patient will benefit of continuation of IV fluids. Continue holding Lasix, spironolactone and Cozaar.  2-COVID-19 viral illness: Patient presented with dehydration, poor oral intake, weakness. Was a started on Remdesivir, Day 2. If patient developed hypoxemia will need to start IV dexamethasone. COVID-19 Labs  Recent Labs    08/14/19 2044 08/15/19 0605 08/16/19 0709 08/17/19 1050  DDIMER 0.70* 0.67* 0.70* 0.52*  FERRITIN 240 239  --   --   LDH 149   --   --   --   CRP 4.4* 4.0* 5.6* 6.0*    Lab Results  Component Value Date   SARSCOV2NAA Detected (A) 08/10/2019   3-DM type II: Blood sugar more than 200, resume low-dose Lantus. Continue with a sliding scale insulin.  4-Gastritis: Patient report nausea, burping. Continue Protonix.  5-CAD status post stenting: Resume aspirin and statins. Mild elevation of cardiac markers in the setting of renal failure. Denies chest pain.  Chronic anemia: Hgb stable.   Hypomagnesemia; received IV magnesium. Level wnl now.   Estimated body mass index is 22.65 kg/m as calculated from the following:   Height as of this encounter: 5\' 10"  (1.778 m).   Weight as of this encounter: 71.6 kg.   DVT prophylaxis: Lovenox Code Status: Full code Family Communication: Care discussed with patient Disposition Plan: Remain in the hospital to receive IV hydration, will need physical therapist evaluation, and Remdesivir.  Consultants:   none  Procedures:   none  Antimicrobials:    Subjective: He did not have a good night of sleep but reports feeling better with improvement of his dyspnea. He uses a cane at home but reports feeling weak recently.  He lives with his twin brother who is hospitalized with COVID-19 and requests [facility] services at the time of dc should his brother remain in the hospital when the patient is discharged.  Objective: Vitals:   08/16/19 2108 08/17/19 0410 08/17/19 0800 08/17/19 1634  BP: (!) 122/59 (!) 106/59 125/70 119/69  Pulse: 62 (!) 57 (!) 54 64  Resp: (!) 22 (!)  21 (!) 21 20  Temp: 98.9 F (37.2 C) 98.7 F (37.1 C) 98.1 F (36.7 C) 98.7 F (37.1 C)  TempSrc: Oral Oral Oral Oral  SpO2: 94% 93% 95% 97%  Weight:  71.6 kg    Height:       No intake or output data in the 24 hours ending 08/17/19 1838 Filed Weights   08/16/19 0234 08/17/19 0410  Weight: 72 kg 71.6 kg    Examination:  General exam: NAD, sitting in recliner Respiratory system: No respiratory  distress Cardiovascular system: S 1, S 2 RRR Gastrointestinal system: ND, NT Central nervous system: non focal. Alert and oriented x3 Extremities: Symmetric power Skin: No rashes   Data Reviewed: I have personally reviewed following labs and imaging studies  CBC: Recent Labs  Lab 08/14/19 2044 08/15/19 0605 08/17/19 1050  WBC 5.4 4.2 7.3  NEUTROABS 3.5 2.5  --   HGB 11.0* 10.2* 11.1*  HCT 32.9* 30.4* 33.2*  MCV 92.7 93.0 92.0  PLT 275 254 [id number]   Basic Metabolic Panel: Recent Labs  Lab 08/14/19 2044 08/15/19 0605 08/16/19 0709 08/17/19 1050  NA 130* 135 138 137  K 3.8 3.8 3.5 4.2  CL 99 104 109 109  CO2 21* 20* 20* 21*  GLUCOSE 265* 286* 101* 248*  BUN 41* 37* 33* 37*  CREATININE 2.08* 1.83* 1.58* 1.71*  CALCIUM 8.5* 7.8* 8.1* 8.2*  MG  --  1.4*  --  2.0   GFR: Estimated Creatinine Clearance: 44.2 mL/min (A) (by C-G formula based on SCr of 1.71 mg/dL (H)). Liver Function Tests: Recent Labs  Lab 08/14/19 2044 08/15/19 0605 08/16/19 0709 08/17/19 1050  AST 22 21 21 28   ALT 21 19 18 20   ALKPHOS 54 44 46 60  BILITOT 0.7 0.4 0.2* 0.5  PROT 6.6 5.4* 5.3* 6.5  ALBUMIN 3.4* 2.9* 2.7* 3.1*   No results for input(s): LIPASE, AMYLASE in the last 168 hours. No results for input(s): AMMONIA in the last 168 hours. Coagulation Profile: No results for input(s): INR, PROTIME in the last 168 hours. Cardiac Enzymes: Recent Labs  Lab 08/15/19 0605  CKTOTAL 73   BNP (last 3 results) No results for input(s): PROBNP in the last 8760 hours. HbA1C: No results for input(s): HGBA1C in the last 72 hours. CBG: Recent Labs  Lab 08/16/19 2107 08/17/19 0236 08/17/19 0750 08/17/19 1205 08/17/19 1637  GLUCAP 150* 124* 123* 249* 122*   Lipid Profile: Recent Labs    08/14/19 2044  TRIG 103   Thyroid Function Tests: Recent Labs    08/15/19 0605  TSH 1.943   Anemia Panel: Recent Labs    08/14/19 2044 08/15/19 0605  FERRITIN 240 239   Sepsis Labs: Recent  Labs  Lab 08/14/19 2044 08/14/19 2045 08/15/19 0349  PROCALCITON <0.10  --   --   LATICACIDVEN  --  1.6 0.8    Recent Results (from the past 240 hour(s))  Novel Coronavirus, NAA ([facility])     Status: Abnormal   Collection Time: 08/10/19  8:49 AM   Specimen: Nasopharyngeal(NP) swabs in vial transport medium   NASOPHARYNGE  TESTING  Result Value Ref Range Status   SARS-CoV-2, NAA Detected (A) Not Detected Final    Comment: This nucleic acid amplification test was developed and its performance characteristics determined by [employer]. Nucleic acid amplification tests include PCR and TMA. This test has not been FDA cleared or approved. This test has been authorized by FDA under an Emergency Use Authorization (EUA).  This test is only authorized for the duration of time the declaration that circumstances exist justifying the authorization of the emergency use of in vitro diagnostic tests for detection of SARS-CoV-2 virus and/or diagnosis of COVID-19 infection under section 564(b)(1) of the Act, 21 U.S.C. [id number]) (1), unless the authorization is terminated or revoked sooner. When diagnostic testing is negative, the possibility of a false negative result should be considered in the context of a patient's recent exposures and the presence of clinical signs and symptoms consistent with COVID-19. An individual without symptoms of COVID-19 and who is not shedding SARS-CoV-2 virus would  expect to have a negative (not detected) result in this assay.   Blood Culture (routine x 2)     Status: None (Preliminary result)   Collection Time: 08/14/19  8:15 PM   Specimen: BLOOD RIGHT HAND  Result Value Ref Range Status   Specimen Description BLOOD RIGHT HAND  Final   Special Requests   Final    BOTTLES DRAWN AEROBIC AND ANAEROBIC Blood Culture adequate volume   Culture   Final    NO GROWTH 3 DAYS Performed at [facility], [address], [place], [place] [address]     Report Status PENDING  Incomplete  Blood Culture (routine x 2)     Status: None (Preliminary result)   Collection Time: 08/14/19  8:22 PM   Specimen: BLOOD  Result Value Ref Range Status   Specimen Description BLOOD LEFT ANTECUBITAL  Final   Special Requests   Final    BOTTLES DRAWN AEROBIC AND ANAEROBIC Blood Culture adequate volume   Culture   Final    NO GROWTH 3 DAYS Performed at [facility], [place] [address], [place], [place] [address]    Report Status PENDING  Incomplete         Radiology Studies: No results found.      Scheduled Meds: . aspirin EC  81 mg Oral Daily  . atorvastatin  80 mg Oral Daily  . carvedilol  3.125 mg Oral BID WC  . enoxaparin (LOVENOX) injection  40 mg Subcutaneous Daily  . insulin aspart  0-9 Units Subcutaneous TID WC  . insulin glargine  10 Units Subcutaneous Daily  . pantoprazole (PROTONIX) IV  40 mg Intravenous Q12H  . vitamin B-12  100 mcg Oral Daily   Continuous Infusions: . remdesivir 100 mg in NS 100 mL 100 mg (08/17/19 1017)     LOS: 2 days    Time spent: 35 minutes.     Blain Pais, MD [facility]   If 7PM-7AM, please contact night-coverage [email or web address] Password [facility] 08/17/2019, 6:38 PM

## 2019-08-17 NOTE — Patient Outreach (Signed)
  [facility]) Care Management Chronic Special Needs Program    08/17/2019  Name: Jeffrey [name], DOB: 04-25-1956  MRN: [id number]   [name] is enrolled in a chronic special needs plan for Diabetes. Client admitted to [facility] hospital on 08/14/19 for weakness, fatigue, COVID positive.  No health risk assessment.  Individualized care plan completed based on available data and sent to [facility] hospital utilization management department.   PLAN:  RNCM will continue to follow.   Quinn Plowman RN,BSN,CCM [facility] [phone]

## 2019-08-17 NOTE — Evaluation (Signed)
Occupational Therapy Evaluation Patient Details Name: Jeffrey [name] MRN: [id number] DOB: 09-20-1955 Today's Date: 08/17/2019    History of Present Illness 64 yo male admitted COVID + with weakness nausea and vomitting with dehydration PMH Cardiac arrest, CHF, DM diabetic retinopathy HLD HTN MI s/p primary angioplasty with coronary stent 2017 vitamin B12 deficiency refractive surgery angioplasty 2015 CKD III   Clinical Impression   PTA pt living with brother with one story town home. He endorses that BADL/IADL has been getting more taxing due to generalized weakness lately. He uses QC at baseline. Brother is also currently hospitalized with COVID, and brother normally does driving and IADL management. At time of eval, pt resistant to OOB activity. He is very anxious, slightly agitated and tearful. He states he was in the recline for an extended period of time and now is sore on his backside. Educated pt on ECS strategies, as well as coping skills for anxiety. Discussed mental imaging, deep breathing, and distractions to limit anxiety in unfamiliar environment for more productive BADL. Also discussed safe d/c as well as home safety to prevent falls. Recommend pt return home with [facility] and 24/7 assist- possibly [facility] first program if patient qualifies. Will continue to follow per POC listed below.     Follow Up Recommendations  Home health OT;Supervision/Assistance - 24 hour;Other (comment)(possibly [facility] first program?)    Equipment Recommendations  3 in 1 bedside commode    Recommendations for Other Services       Precautions / Restrictions Precautions Precautions: Fall Restrictions Weight Bearing Restrictions: No      Mobility Bed Mobility               General bed mobility comments: pt began to initiate then deferred, stated he was too aggravated from having to sit in the recliner for so long  Transfers                      Balance                                            ADL either performed or assessed with clinical judgement   ADL Overall ADL's : Needs assistance/impaired Eating/Feeding: Set up;Sitting   Grooming: Set up;Sitting   Upper Body Bathing: Minimal assistance;Sitting   Lower Body Bathing: Moderate assistance;Sitting/lateral leans;Sit to/from stand   Upper Body Dressing : Minimal assistance;Sitting   Lower Body Dressing: Moderate assistance;Sitting/lateral leans;Sit to/from stand   Toilet Transfer: Moderate assistance;RW;Ambulation   Toileting- Clothing Manipulation and Hygiene: Moderate assistance;Sit to/from stand   Tub/ Banker: Moderate assistance;Shower seat;Ambulation;Rolling walker     General ADL Comments: pt presenting with psychosocial deficits limiting independent and confident engagement in BADLs, as well as generalized weakness and poor activity tolerance     Vision Baseline Vision/History: No visual deficits Patient Visual Report: No change from baseline       Perception     Praxis      Pertinent Vitals/Pain Pain Assessment: No/denies pain     Hand Dominance     Extremity/Trunk Assessment Upper Extremity Assessment Upper Extremity Assessment: Generalized weakness   Lower Extremity Assessment Lower Extremity Assessment: Generalized weakness       Communication Communication Communication: No difficulties   Cognition Arousal/Alertness: Awake/alert Behavior During Therapy: Anxious Overall Cognitive Status: No family/caregiver present to determine baseline cognitive functioning Area of Impairment: Memory;Problem  solving                     Memory: Decreased short-term memory       Problem Solving: Slow processing;Requires verbal cues General Comments: pt presents anxious and intermittently tearful throughout session- endorses some confusion and poor STM. Overall slow processing, very anxious and self limiting without encouragement and  validation   General Comments       Exercises     Shoulder Instructions      Home Living Family/patient expects to be discharged to:: Private residence Living Arrangements: Other relatives(twin brother whom also has COVID) Available Help at Discharge: Family Type of Home: Other(Comment)(townhome) Home Access: Level entry     Home Layout: One level     Bathroom Shower/Tub: Occupational psychologist: Handicapped height Bathroom Accessibility: Yes How Accessible: Accessible via [place]: [place] - single point;Cane - quad;Shower seat;Hand held shower head          Prior Functioning/Environment Level of Independence: Independent with assistive device(s)        Comments: uses QC at baseline, states he has been slowing down with functional mobility and needing more assistance. Does not drive        OT Problem List: Decreased strength;Decreased knowledge of use of DME or AE;Decreased activity tolerance;Decreased cognition;Impaired balance (sitting and/or standing);Decreased safety awareness      OT Treatment/Interventions: Self-care/ADL training;Therapeutic exercise;Patient/family education;Balance training;Energy conservation;Therapeutic activities;DME and/or AE instruction;Cognitive remediation/compensation    OT Goals(Current goals can be found in the care plan section) Acute Rehab OT Goals Patient Stated Goal: return home to live with his brother OT Goal Formulation: With patient Time For Goal Achievement: 08/31/19 Potential to Achieve Goals: Good  OT Frequency: Min 2X/week   Barriers to D/C:            Co-evaluation              AM-PAC OT "6 Clicks" Daily Activity     Outcome Measure Help from another person eating meals?: A Little Help from another person taking care of personal grooming?: A Little Help from another person toileting, which includes using toliet, bedpan, or urinal?: A Lot Help from another person bathing (including  washing, rinsing, drying)?: A Lot Help from another person to put on and taking off regular upper body clothing?: A Little Help from another person to put on and taking off regular lower body clothing?: A Lot 6 Click Score: 15   End of Session Nurse Communication: Mobility status  Activity Tolerance: Patient tolerated treatment well Patient left: in bed;with call bell/phone within reach;with bed alarm set  OT Visit Diagnosis: Unsteadiness on feet (R26.81);Other abnormalities of gait and mobility (R26.89);Muscle weakness (generalized) (M62.81);Other symptoms and signs involving cognitive function                Time: 1535-1605 OT Time Calculation (min): 30 min Charges:  OT General Charges $OT Visit: 1 Visit OT Evaluation $OT Eval Moderate Complexity: 1 Mod OT Treatments $Self Care/Home Management : 8-22 mins  Zenovia Jarred, MSOT, OTR/L Acute Rehabilitation Services [facility] Office Number: [phone]  Zenovia Jarred 08/17/2019, 5:46 PM

## 2019-08-18 DIAGNOSIS — E1142 Type 2 diabetes mellitus with diabetic polyneuropathy: Secondary | ICD-10-CM

## 2019-08-18 DIAGNOSIS — Z794 Long term (current) use of insulin: Secondary | ICD-10-CM

## 2019-08-18 LAB — COMPREHENSIVE METABOLIC PANEL
ALT: 15 U/L (ref 0–44)
AST: 21 U/L (ref 15–41)
Albumin: 2.7 g/dL — ABNORMAL LOW (ref 3.5–5.0)
Alkaline Phosphatase: 55 U/L (ref 38–126)
Anion gap: 11 (ref 5–15)
BUN: 41 mg/dL — ABNORMAL HIGH (ref 8–23)
CO2: 19 mmol/L — ABNORMAL LOW (ref 22–32)
Calcium: 8.2 mg/dL — ABNORMAL LOW (ref 8.9–10.3)
Chloride: 110 mmol/L (ref 98–111)
Creatinine, Ser: 1.99 mg/dL — ABNORMAL HIGH (ref 0.61–1.24)
GFR calc Af Amer: 40 mL/min — ABNORMAL LOW (ref >60)
GFR calc non Af Amer: 34 mL/min — ABNORMAL LOW (ref >60)
Glucose, Bld: 137 mg/dL — ABNORMAL HIGH (ref 70–99)
Potassium: 4.1 mmol/L (ref 3.5–5.1)
Sodium: 140 mmol/L (ref 135–145)
Total Bilirubin: <0.1 mg/dL — ABNORMAL LOW (ref 0.3–1.2)
Total Protein: 5.2 g/dL — ABNORMAL LOW (ref 6.5–8.1)

## 2019-08-18 LAB — D-DIMER, QUANTITATIVE: D-Dimer, Quant: 0.37 ug/mL-FEU (ref 0.00–0.50)

## 2019-08-18 LAB — C-REACTIVE PROTEIN: CRP: 5.2 mg/dL — ABNORMAL HIGH (ref <1.0)

## 2019-08-18 LAB — GLUCOSE, CAPILLARY
Glucose-Capillary: 151 mg/dL — ABNORMAL HIGH (ref 70–99)
Glucose-Capillary: 141 mg/dL — ABNORMAL HIGH (ref 70–99)
Glucose-Capillary: 160 mg/dL — ABNORMAL HIGH (ref 70–99)
Glucose-Capillary: 154 mg/dL — ABNORMAL HIGH (ref 70–99)

## 2019-08-18 MED ORDER — SODIUM CHLORIDE 0.9 % IV SOLN
INTRAVENOUS | Status: DC
  Administered 2019-08-18: 1000 mL via INTRAVENOUS

## 2019-08-18 NOTE — Progress Notes (Signed)
PT Cancellation Note  Patient Details Name: Jeffrey [name] MRN: [id number] DOB: Jul 28, 1956   Cancelled Treatment:    Reason Eval/Treat Not Completed: Patient declined, no reason specified  Patient adamant that he would not do PT today (as soon as I walked in the door). Attempted to discuss with pt, however he became more and more anxious stating "you can't force me to do it!"  Nurse tech overheard and came in to attempt to discuss with pt and he remained adamant that I needed to leave.   Arby Barrette, PT Pager [phone]   Rexanne Mano 08/18/2019, 4:42 PM

## 2019-08-18 NOTE — Progress Notes (Signed)
Occupational Therapy Treatment Patient Details Name: Jeffrey [name] MRN: [id number] DOB: 1955-10-16 Today's Date: 08/18/2019    History of present illness 64 yo male admitted COVID + with weakness nausea and vomitting with dehydration PMH Cardiac arrest, CHF, DM diabetic retinopathy HLD HTN MI s/p primary angioplasty with coronary stent 2017 vitamin B12 deficiency refractive surgery angioplasty 2015 CKD III   OT comments  Pt much improved from previous date. Able to complete functional mobility and BADL at min A- min guard level without AD. Pt needing occasional safety cues to slow down pace. He was able to complete functional mobility into the bathroom and around the room- even did a small dance to show OT his progress. He remains anxious and needing control, reports he does not like "feeling like he is trapped in a corner". Continue to recommend HHOT at d/c, pt reports he is skeptical about strangers in his home with COVID. Will continue to follow.   Follow Up Recommendations  Home health OT;Supervision - Intermittent;Other (comment)(suspect pt will refuse)    Equipment Recommendations  3 in 1 bedside commode    Recommendations for Other Services      Precautions / Restrictions Precautions Precautions: Fall       Mobility Bed Mobility Overal bed mobility: Modified Independent             General bed mobility comments: no physical assist  Transfers Overall transfer level: Needs assistance Equipment used: Rolling walker (2 wheeled);None Transfers: Sit to/from Stand Sit to Stand: Min guard;Min assist         General transfer comment: min guard- min A. Pt requiring x2 trials to initially stand up, self corrected and was able to continue without phys assist    Balance Overall balance assessment: Needs assistance Sitting-balance support: No upper extremity supported;Feet unsupported Sitting balance-Leahy Scale: Good     Standing balance support: Bilateral upper  extremity supported Standing balance-Leahy Scale: Poor                             ADL either performed or assessed with clinical judgement   ADL Overall ADL's : Needs assistance/impaired         Upper Body Bathing: Set up;Sitting   Lower Body Bathing: Set up;Sit to/from stand;Sitting/lateral leans   Upper Body Dressing : Set up;Sitting   Lower Body Dressing: Set up;Sitting/lateral leans;Sit to/from stand   Toilet Transfer: Min guard;Cueing for Office manager Details (indicate cue type and reason): pt walked into BR to empty urinal without use of AD or physical assist Toileting- Clothing Manipulation and Hygiene: Set up;Sit to/from stand   Tub/ Shower Transfer: Min guard;Ambulation;Shower seat   Functional mobility during ADLs: Min guard;Minimal assistance;Cueing for safety General ADL Comments: pt able to complete functional mobility with min A-ming guard level of assist. He remains anxious and needing encouragement. He is much improved with his BADL engagement from previous date     Vision Baseline Vision/History: No visual deficits Patient Visual Report: No change from baseline     Perception     Praxis      Cognition Arousal/Alertness: Awake/alert Behavior During Therapy: Anxious Overall Cognitive Status: No family/caregiver present to determine baseline cognitive functioning Area of Impairment: Memory                     Memory: Decreased short-term memory         General Comments: improved cognition this date, still minor  STM noted. Remains anxious, needing validation        Exercises     Shoulder Instructions       General Comments      Pertinent Vitals/ Pain       Pain Assessment: No/denies pain  Home Living                                          Prior Functioning/Environment              Frequency           Progress Toward Goals  OT Goals(current goals can now be found in the  care plan section)  Progress towards OT goals: Progressing toward goals  Acute Rehab OT Goals Patient Stated Goal: return home to live with his brother OT Goal Formulation: With patient Time For Goal Achievement: 08/31/19 Potential to Achieve Goals: Good  Plan Discharge plan needs to be updated    Co-evaluation                 AM-PAC OT "6 Clicks" Daily Activity     Outcome Measure   Help from another person eating meals?: A Little Help from another person taking care of personal grooming?: A Little Help from another person toileting, which includes using toliet, bedpan, or urinal?: A Little Help from another person bathing (including washing, rinsing, drying)?: A Little Help from another person to put on and taking off regular upper body clothing?: A Little Help from another person to put on and taking off regular lower body clothing?: A Little 6 Click Score: 18    End of Session Equipment Utilized During Treatment: Gait belt  OT Visit Diagnosis: Unsteadiness on feet (R26.81);Other abnormalities of gait and mobility (R26.89);Muscle weakness (generalized) (M62.81);Other symptoms and signs involving cognitive function   Activity Tolerance Patient tolerated treatment well   Patient Left in bed;with call bell/phone within reach;with bed alarm set   Nurse Communication Mobility status        Time: [id number] OT Time Calculation (min): 33 min  Charges: OT General Charges $OT Visit: 1 Visit OT Treatments $Self Care/Home Management : 23-37 mins  Zenovia Jarred, MSOT, OTR/L Acute Rehabilitation Services [facility] Office Number: [phone]   Zenovia Jarred 08/18/2019, 6:08 PM

## 2019-08-18 NOTE — Progress Notes (Signed)
PROGRESS NOTE    Jeffrey [name]  [id number] DOB: 1956-07-13 DOA: 08/14/2019 PCP: Jeffrey Barrack, MD   Brief Narrative:  Jeffrey [name] is a 64 y.o. male with history of CAD status post stenting with history of cardiac arrest, diastolic dysfunction last EF measured was in December 2020 with EF of 50 to 55% diabetes mellitus type 2 chronic kidney disease stage III baseline creatinine around 1.4 has been experiencing increasing fatigue and weakness over the last 3 weeks.  Denies any chest pain but has been having poor appetite with some nausea denies vomiting.  Patient on December 30 had worsening symptoms and at that time got check for COVID-19 results of which came yesterday. Which was positive.   Evaluation in the ED, patient was afebrile no hypoxemia.  Creatinine increased to 2.0.  Patient dehydrated unable to ambulate because of weakness.  Chest x-ray unremarkable.  Assessment & Plan:   Active Problems:   T2DM (type 2 diabetes mellitus) ([place])   Hypertension associated with diabetes ([place])   CAD S/P percutaneous coronary angioplasty   Hyperlipidemia associated with type 2 diabetes mellitus ([place])   Chronic combined systolic and diastolic heart failure ([place])   COVID-19 virus detected   AKI (acute kidney injury) ([place])   1-Acute Renal Failure on Chronic kidney Disease Stage III: Prior creatinine per records 1.4.  Patient presented with a creatinine of 2.0. In the setting of poor oral intake hypovolemia. Slightly improved but worsened again. Patient will benefit of continuation of IV fluids which were restarted. Continue holding Lasix, spironolactone and Cozaar.  2-COVID-19 viral illness: Patient presented with dehydration, poor oral intake, weakness. Was a started on Remdesivir, Day 3. If patient developed hypoxemia will need to start IV dexamethasone. COVID-19 Labs  Recent Labs    08/16/19 0709 08/17/19 1050 08/18/19 0346  DDIMER 0.70* 0.52* 0.37  CRP 5.6* 6.0* 5.2*     Lab Results  Component Value Date   SARSCOV2NAA Detected (A) 08/10/2019   3-DM type II: Blood sugar more than 200, resume low-dose Lantus. Continue with a sliding scale insulin.  4-Gastritis: Patient report nausea, burping. Continue Protonix.  5-CAD status post stenting: Resume aspirin and statins. Mild elevation of cardiac markers in the setting of renal failure. Denies chest pain.  Chronic anemia: Hgb stable.   Hypomagnesemia; received IV magnesium. Level wnl now.   Estimated body mass index is 21.82 kg/m as calculated from the following:   Height as of this encounter: 5\' 10"  (1.778 m).   Weight as of this encounter: 69 kg.   DVT prophylaxis: Lovenox Code Status: Full code Family Communication: Care discussed with patient Disposition Plan: Remain in the hospital to receive IV hydration, will need HH PT, OT and RN. Needs to complete Remdesivir.  Consultants:   none  Procedures:   none  Antimicrobials:    Subjective: He reports feeling better today but asks again for [facility] services after his discharge as he continues to feel deconditioned.  Objective: Vitals:   08/17/19 2105 08/18/19 0000 08/18/19 0429 08/18/19 1200  BP: 126/75 126/74 (!) 119/59 118/61  Pulse: 62 (!) 55 72 60  Resp: 18 18 20 19   Temp: 98.2 F (36.8 C)  98.2 F (36.8 C) 97.9 F (36.6 C)  TempSrc: Oral  Oral Oral  SpO2: 99% 95% 95% 94%  Weight:   69 kg   Height:        Intake/Output Summary (Last 24 hours) at 08/18/2019 1623 Last data filed at 08/18/2019 1110 Gross per 24 hour  Intake 400 ml  Output 1200 ml  Net -800 ml   Filed Weights   08/16/19 0234 08/17/19 0410 08/18/19 0429  Weight: 72 kg 71.6 kg 69 kg    Examination:  General exam: NAD, sitting in recliner Respiratory system: No respiratory distress Cardiovascular system: S 1, S 2 RRR Gastrointestinal system: ND, NT Central nervous system: non focal. Alert and oriented x3 Extremities: Symmetric power Skin: No  rashes   Data Reviewed: I have personally reviewed following labs and imaging studies  CBC: Recent Labs  Lab 08/14/19 2044 08/15/19 0605 08/17/19 1050  WBC 5.4 4.2 7.3  NEUTROABS 3.5 2.5  --   HGB 11.0* 10.2* 11.1*  HCT 32.9* 30.4* 33.2*  MCV 92.7 93.0 92.0  PLT 275 254 [id number]   Basic Metabolic Panel: Recent Labs  Lab 08/14/19 2044 08/15/19 0605 08/16/19 0709 08/17/19 1050 08/18/19 0346  NA 130* 135 138 137 140  K 3.8 3.8 3.5 4.2 4.1  CL 99 104 109 109 110  CO2 21* 20* 20* 21* 19*  GLUCOSE 265* 286* 101* 248* 137*  BUN 41* 37* 33* 37* 41*  CREATININE 2.08* 1.83* 1.58* 1.71* 1.99*  CALCIUM 8.5* 7.8* 8.1* 8.2* 8.2*  MG  --  1.4*  --  2.0  --    GFR: Estimated Creatinine Clearance: 36.6 mL/min (A) (by C-G formula based on SCr of 1.99 mg/dL (H)). Liver Function Tests: Recent Labs  Lab 08/14/19 2044 08/15/19 0605 08/16/19 0709 08/17/19 1050 08/18/19 0346  AST 22 21 21 28 21   ALT 21 19 18 20 15   ALKPHOS 54 44 46 60 55  BILITOT 0.7 0.4 0.2* 0.5 <0.1*  PROT 6.6 5.4* 5.3* 6.5 5.2*  ALBUMIN 3.4* 2.9* 2.7* 3.1* 2.7*   No results for input(s): LIPASE, AMYLASE in the last 168 hours. No results for input(s): AMMONIA in the last 168 hours. Coagulation Profile: No results for input(s): INR, PROTIME in the last 168 hours. Cardiac Enzymes: Recent Labs  Lab 08/15/19 0605  CKTOTAL 73   BNP (last 3 results) No results for input(s): PROBNP in the last 8760 hours. HbA1C: No results for input(s): HGBA1C in the last 72 hours. CBG: Recent Labs  Lab 08/17/19 1205 08/17/19 1637 08/17/19 2104 08/18/19 0754 08/18/19 1204  GLUCAP 249* 122* 113* 151* 141*   Lipid Profile: No results for input(s): CHOL, HDL, LDLCALC, TRIG, CHOLHDL, LDLDIRECT in the last 72 hours. Thyroid Function Tests: No results for input(s): TSH, T4TOTAL, FREET4, T3FREE, THYROIDAB in the last 72 hours. Anemia Panel: No results for input(s): VITAMINB12, FOLATE, FERRITIN, TIBC, IRON, RETICCTPCT in the  last 72 hours. Sepsis Labs: Recent Labs  Lab 08/14/19 2044 08/14/19 2045 08/15/19 0349  PROCALCITON <0.10  --   --   LATICACIDVEN  --  1.6 0.8    Recent Results (from the past 240 hour(s))  Novel Coronavirus, NAA ([facility])     Status: Abnormal   Collection Time: 08/10/19  8:49 AM   Specimen: Nasopharyngeal(NP) swabs in vial transport medium   NASOPHARYNGE  TESTING  Result Value Ref Range Status   SARS-CoV-2, NAA Detected (A) Not Detected Final    Comment: This nucleic acid amplification test was developed and its performance characteristics determined by [employer]. Nucleic acid amplification tests include PCR and TMA. This test has not been FDA cleared or approved. This test has been authorized by FDA under an Emergency Use Authorization (EUA). This test is only authorized for the duration of time the declaration that circumstances exist justifying the authorization  of the emergency use of in vitro diagnostic tests for detection of SARS-CoV-2 virus and/or diagnosis of COVID-19 infection under section 564(b)(1) of the Act, 21 U.S.C. [id number]) (1), unless the authorization is terminated or revoked sooner. When diagnostic testing is negative, the possibility of a false negative result should be considered in the context of a patient's recent exposures and the presence of clinical signs and symptoms consistent with COVID-19. An individual without symptoms of COVID-19 and who is not shedding SARS-CoV-2 virus would  expect to have a negative (not detected) result in this assay.   Blood Culture (routine x 2)     Status: None (Preliminary result)   Collection Time: 08/14/19  8:15 PM   Specimen: BLOOD RIGHT HAND  Result Value Ref Range Status   Specimen Description BLOOD RIGHT HAND  Final   Special Requests   Final    BOTTLES DRAWN AEROBIC AND ANAEROBIC Blood Culture adequate volume   Culture   Final    NO GROWTH 4 DAYS Performed at [facility], [place]  [address], [place], [place] [address]    Report Status PENDING  Incomplete  Blood Culture (routine x 2)     Status: None (Preliminary result)   Collection Time: 08/14/19  8:22 PM   Specimen: BLOOD  Result Value Ref Range Status   Specimen Description BLOOD LEFT ANTECUBITAL  Final   Special Requests   Final    BOTTLES DRAWN AEROBIC AND ANAEROBIC Blood Culture adequate volume   Culture   Final    NO GROWTH 4 DAYS Performed at [facility], [place] [address], [place], [place] [address]    Report Status PENDING  Incomplete         Radiology Studies: No results found.      Scheduled Meds: . aspirin EC  81 mg Oral Daily  . atorvastatin  80 mg Oral Daily  . carvedilol  3.125 mg Oral BID WC  . enoxaparin (LOVENOX) injection  40 mg Subcutaneous Daily  . insulin aspart  0-9 Units Subcutaneous TID WC  . insulin glargine  10 Units Subcutaneous Daily  . pantoprazole (PROTONIX) IV  40 mg Intravenous Q12H  . vitamin B-12  100 mcg Oral Daily   Continuous Infusions: . sodium chloride    . remdesivir 100 mg in NS 100 mL 100 mg (08/18/19 0949)     LOS: 3 days    Time spent: 35 minutes.     Blain Pais, MD [facility]   If 7PM-7AM, please contact night-coverage [email or web address] Password [facility] 08/18/2019, 4:23 PM

## 2019-08-18 NOTE — Care Management (Signed)
CM discussed recommendation/order for [facility] however pt declined.  Pt informed CM that he is independent from home and lives with his brother.  Pt has PCP and denied barriers with paying for medications

## 2019-08-19 LAB — COMPREHENSIVE METABOLIC PANEL
ALT: 17 U/L (ref 0–44)
AST: 21 U/L (ref 15–41)
Albumin: 2.9 g/dL — ABNORMAL LOW (ref 3.5–5.0)
Alkaline Phosphatase: 66 U/L (ref 38–126)
Anion gap: 10 (ref 5–15)
BUN: 32 mg/dL — ABNORMAL HIGH (ref 8–23)
CO2: 19 mmol/L — ABNORMAL LOW (ref 22–32)
Calcium: 7.9 mg/dL — ABNORMAL LOW (ref 8.9–10.3)
Chloride: 107 mmol/L (ref 98–111)
Creatinine, Ser: 1.78 mg/dL — ABNORMAL HIGH (ref 0.61–1.24)
GFR calc Af Amer: 46 mL/min — ABNORMAL LOW (ref >60)
GFR calc non Af Amer: 39 mL/min — ABNORMAL LOW (ref >60)
Glucose, Bld: 117 mg/dL — ABNORMAL HIGH (ref 70–99)
Potassium: 4 mmol/L (ref 3.5–5.1)
Sodium: 136 mmol/L (ref 135–145)
Total Bilirubin: 0.7 mg/dL (ref 0.3–1.2)
Total Protein: 5.6 g/dL — ABNORMAL LOW (ref 6.5–8.1)

## 2019-08-19 LAB — CULTURE, BLOOD (ROUTINE X 2)
Culture: NO GROWTH
Culture: NO GROWTH
Special Requests: ADEQUATE
Special Requests: ADEQUATE

## 2019-08-19 LAB — C-REACTIVE PROTEIN: CRP: 6.3 mg/dL — ABNORMAL HIGH (ref <1.0)

## 2019-08-19 LAB — GLUCOSE, CAPILLARY
Glucose-Capillary: 107 mg/dL — ABNORMAL HIGH (ref 70–99)
Glucose-Capillary: 201 mg/dL — ABNORMAL HIGH (ref 70–99)

## 2019-08-19 LAB — D-DIMER, QUANTITATIVE: D-Dimer, Quant: 0.4 ug/mL-FEU (ref 0.00–0.50)

## 2019-08-19 MED ORDER — CYANOCOBALAMIN 100 MCG PO TABS: 100.0000 ug | ORAL_TABLET | Freq: Every day | ORAL | 0 refills | Status: DC

## 2019-08-19 NOTE — Plan of Care (Signed)

## 2019-08-19 NOTE — Care Management Important Message (Signed)
Important Message  Patient Details  Name: Salaam Ceresa MRN: [id number] Date of Birth: 1955/10/23   Medicare Important Message Given:  Yes - Important Message mailed due to current National Emergency  Verbal consent obtained due to current National Emergency  Relationship to patient: Self Contact Name: Imer Antonetti Call Date: 08/19/19  Time: [id number] Phone: [phone] Outcome: Spoke with contact Important Message mailed to: Patient address on file    [place] 08/19/2019, 1:32 PM

## 2019-08-19 NOTE — Discharge Instructions (Signed)
Due to your worsening kidney function, we suggest that you do not take metformin, furosemide, cozaar, and spironolactone. Check your blood pressure every day and follow up with your primary care doctor to decide if or when you should start taking them again.

## 2019-08-19 NOTE — Care Management (Signed)
CM spoke with pt via phone prior to discharge.  Pt declined DME as recommended by therapy and continues to decline HH.  CM signing off

## 2019-08-19 NOTE — Discharge Summary (Signed)
Physician Discharge Summary  Quamir Willemsen RVI:[id number] DOB: 01/17/1956 DOA: 65/10/2019  PCP: Vivi Barrack, MD  Admit date: 08/14/2019 Discharge date: 08/19/2019  Admitted From: Home  Disposition:  Home  Recommendations for Outpatient Follow-up:  1. Follow up with PCP in 1-2 weeks 2. Please obtain CMP/CBC in one week   Home Health: No (patient was offered [facility] but declined this starting that he did not want anyone visiting his home) Equipment/Devices: No Discharge Condition:Stable CODE STATUS: Full Diet recommendation: Heart Healthy   Brief/Interim Summary: Marqus Johnsonis a 64 y.o.malewithhistory of CAD status post stenting with history of cardiac arrest, diastolic dysfunction last EF measured was in December 2020 with EF of 50 to 55% diabetes mellitus type 2 chronic kidney disease stage III baseline creatinine around 1.4 has been experiencing increasing fatigue and weakness over the last 3 weeks. Denies any chest pain but has been having poor appetite with some nausea denies vomiting.Patient on December 30 had worsening symptoms and at that time got check for COVID-19 results of which came yesterday. Which was positive.   Evaluation in the ED, patient was afebrile no hypoxemia.  Creatinine increased to 2.0.  Patient dehydrated unable to ambulate because of weakness.  Chest x-ray unremarkable.  Discharge Diagnoses:  Active Problems:   T2DM (type 2 diabetes mellitus) ([place])   Hypertension associated with diabetes ([place])   CAD S/P percutaneous coronary angioplasty   Hyperlipidemia associated with type 2 diabetes mellitus ([place])   Chronic combined systolic and diastolic heart failure ([place])   COVID-19 virus detected   AKI (acute kidney injury) ([place])  1-Acute Renal Failure on Chronic kidney Disease Stage III: Prior creatinine per records 1.4.  Patient presented with a creatinine of 2.0. In the setting of poor oral intake hypovolemia. Slightly improved with IV fluids.  Lasix,  spironolactone and Cozaar were held while inpatient and will continue to be held at the time of discharge. He should follow up with his PCP for repeat labs, BP monitoring, and decision on when to resume his BP medications and diuretics.   2-COVID-19 viral illness: Patient presented with dehydration, poor oral intake, weakness. Completed treatment with remdesivir.  COVID-19 Labs  Recent Labs (last 2 labs)        Recent Labs    08/16/19 0709 08/17/19 1050 08/18/19 0346  DDIMER 0.70* 0.52* 0.37  CRP 5.6* 6.0* 5.2*      Recent Labs       Lab Results  Component Value Date   SARSCOV2NAA Detected (A) 08/10/2019     3-DM type II: Metformin held, may resume it if renal function further improving.  Resumed home insulin.   4-Gastritis: Patient report nausea, burping. Improved.   5-CAD status post stenting: Resume aspirin and statins. Mild elevation of cardiac markers in the setting of renal failure. Denies chest pain.  Chronic anemia: Hgb stable.   Hypomagnesemia; received IV magnesium. Level wnl now.   Estimated body mass index is 21.82 kg/m as calculated from the following:   Height as of this encounter: '5\' 10"'  (1.778 m).   Weight as of this encounter: 69 kg.    Discharge Instructions  Discharge Instructions    Diet - low sodium heart healthy   Complete by: As directed    Increase activity slowly   Complete by: As directed    MyChart COVID-19 home monitoring program   Complete by: Aug 19, 2019    Is the patient willing to use the [place] for home monitoring?: Yes  Allergies as of 08/19/2019      Reactions   Codeine Nausea And Vomiting   Penicillins Rash   Did it involve swelling of the face/tongue/throat, SOB, or low BP? no Did it involve sudden or severe rash/hives, skin peeling, or any reaction on the inside of your mouth or nose? yes Did you need to seek medical attention at a hospital or doctor's office? yes When did it last  happen?childhood If all above answers are "NO", may proceed with cephalosporin use.      Medication List    STOP taking these medications   furosemide 40 MG tablet Commonly known as: LASIX   losartan 25 MG tablet Commonly known as: COZAAR   metFORMIN 500 MG 24 hr tablet Commonly known as: GLUCOPHAGE-XR   spironolactone 25 MG tablet Commonly known as: ALDACTONE     TAKE these medications   aspirin EC 81 MG tablet Take 81 mg by mouth daily.   atorvastatin 80 MG tablet Commonly known as: LIPITOR TAKE 1 TABLET BY MOUTH EVERY DAY   carvedilol 3.125 MG tablet Commonly known as: COREG Take 1 tablet (3.125 mg total) by mouth 2 (two) times daily with a meal.   cholecalciferol 25 MCG (1000 UT) tablet Commonly known as: VITAMIN D3 Take 1,000 Units by mouth daily.   cyanocobalamin 100 MCG tablet Take 1 tablet (100 mcg total) by mouth daily. Start taking on: August 20, 2019   glucose blood test strip Check blood sugar 4 times per day   insulin glargine 100 UNIT/ML injection Commonly known as: LANTUS Inject 15-28 units twice daily. What changed:   how much to take  how to take this  when to take this  additional instructions   Insulin Syringe-Needle U-100 31G X 5/16" 0.3 ML Misc Commonly known as: B-D INS SYR HALF-UNIT .3CC/31G Use 6 times a day   OneTouch Delica Lancets 17G Misc Use to check blood sugar 4 times per day   OneTouch Verio IQ System w/Device Kit 1 kit by Does not apply route 4 (four) times daily.      Follow-up Information    Vivi Barrack, MD. Schedule an appointment as soon as possible for a visit in 1 week(s).   Specialty: Family Medicine Contact information: [place] [address] [phone]        Skeet Latch, MD .   Specialty: Cardiology Contact information: [address] [place] [address] [phone]          Allergies  Allergen Reactions  . Codeine Nausea And Vomiting  .  Penicillins Rash    Did it involve swelling of the face/tongue/throat, SOB, or low BP? no Did it involve sudden or severe rash/hives, skin peeling, or any reaction on the inside of your mouth or nose? yes Did you need to seek medical attention at a hospital or doctor's office? yes When did it last happen?childhood If all above answers are "NO", may proceed with cephalosporin use.     Consultations:  None    Procedures/Studies: DG Chest Port 1 View  Result Date: 08/14/2019 CLINICAL DATA:  Shortness of breath EXAM: PORTABLE CHEST 1 VIEW COMPARISON:  05/18/2019 FINDINGS: The heart size and mediastinal contours are within normal limits. Both lungs are clear. The visualized skeletal structures are unremarkable. IMPRESSION: No active disease. Electronically Signed   By: Constance Holster M.D.   On: 08/14/2019 21:35   ECHOCARDIOGRAM COMPLETE  Result Date: 07/22/2019   ECHOCARDIOGRAM REPORT   Patient Name:  [name] Date of Exam: 07/22/2019 Medical Rec #:  [id number]       Height:       68.0 in Accession #:    [id number]      Weight:       161.0 lb Date of Birth:  [birth year]        BSA:          1.86 m Patient Age:    64 years        BP:           125/69 mmHg Patient Gender: M               HR:           78 bpm. Exam Location:  [facility] Procedure: 2D Echo, 3D Echo, Cardiac Doppler, Color Doppler and Strain Analysis Indications:    R55 Syncope.  History:        Patient has prior history of Echocardiogram examinations, most                 recent 09/28/2017. CAD and Previous Myocardial Infarction; Risk                 Factors:Hypertension, Diabetes and Dyslipidemia.  Sonographer:    Jessee Avers, RDCS Referring Phys: [id number] [facility]  1. Left ventricular ejection fraction, by visual estimation, is 50 to 55%. The left ventricle has low normal function. Left ventricular septal wall thickness was mildly increased. There is no left ventricular hypertrophy.  2. The left  ventricle demonstrates regional wall motion abnormalities.  3. Normal GLS -18 Inferior basal and distal septal hypokinesis.  4. Global right ventricle has normal systolic function.The right ventricular size is normal. No increase in right ventricular wall thickness.  5. Left atrial size was mildly dilated.  6. Right atrial size was normal.  7. The mitral valve is normal in structure. Trivial mitral valve regurgitation. No evidence of mitral stenosis.  8. The tricuspid valve is normal in structure. Tricuspid valve regurgitation is not demonstrated.  9. The aortic valve is tricuspid. Aortic valve regurgitation is mild. Mild to moderate aortic valve sclerosis/calcification without any evidence of aortic stenosis. 10. The pulmonic valve was normal in structure. Pulmonic valve regurgitation is not visualized. 11. Aortic dilatation noted. 12. There is mild dilatation of the aortic root measuring 39 mm. 13. The inferior vena cava is normal in size with greater than 50% respiratory variability, suggesting right atrial pressure of 3 mmHg. In comparison to the previous echocardiogram(s): 09/28/17 EF 45-50%. FINDINGS  Left Ventricle: Left ventricular ejection fraction, by visual estimation, is 50 to 55%. The left ventricle has low normal function. The left ventricle demonstrates regional wall motion abnormalities. The left ventricular internal cavity size was the left ventricle is normal in size. There is no left ventricular hypertrophy. Normal left atrial pressure. Normal GLS -18 Inferior basal and distal septal hypokinesis. Right Ventricle: The right ventricular size is normal. No increase in right ventricular wall thickness. Global RV systolic function is has normal systolic function. Left Atrium: Left atrial size was mildly dilated. Right Atrium: Right atrial size was normal in size Pericardium: There is no evidence of pericardial effusion. Mitral Valve: The mitral valve is normal in structure. There is mild thickening of  the mitral valve leaflet(s). There is mild calcification of the mitral valve leaflet(s). Trivial mitral valve regurgitation. No evidence of mitral valve stenosis by observation. Tricuspid Valve: The tricuspid valve is normal in structure. Tricuspid valve regurgitation  is not demonstrated. Aortic Valve: The aortic valve is tricuspid. Aortic valve regurgitation is mild. Mild to moderate aortic valve sclerosis/calcification is present, without any evidence of aortic stenosis. Pulmonic Valve: The pulmonic valve was normal in structure. Pulmonic valve regurgitation is not visualized. Pulmonic regurgitation is not visualized. Aorta: The aortic root, ascending aorta and aortic arch are all structurally normal, with no evidence of dilitation or obstruction and aortic dilatation noted. There is mild dilatation of the aortic root measuring 39 mm. Venous: The inferior vena cava is normal in size with greater than 50% respiratory variability, suggesting right atrial pressure of 3 mmHg. IAS/Shunts: No atrial level shunt detected by color flow Doppler. There is no evidence of a patent foramen ovale. No ventricular septal defect is seen or detected. There is no evidence of an atrial septal defect.  LEFT VENTRICLE PLAX 2D LVIDd:         4.50 cm  Diastology LVIDs:         3.20 cm  LV e' lateral:   8.70 cm/s LV PW:         1.00 cm  LV E/e' lateral: 7.0 LV IVS:        1.20 cm  LV e' medial:    5.87 cm/s LVOT diam:     2.00 cm  LV E/e' medial:  10.4 LV SV:         51 ml LV SV Index:   27.39    2D Longitudinal Strain LVOT Area:     3.14 cm 2D Strain GLS (A2C):   -16.5 %                         2D Strain GLS (A3C):   -16.8 %                         2D Strain GLS (A4C):   -20.6 %                         2D Strain GLS Avg:     -18.0 %                          3D Volume EF:                         3D EF:        64 %                         LV EDV:       107 ml                         LV ESV:       39 ml                         LV SV:         68 ml RIGHT VENTRICLE RV Basal diam:  3.40 cm RV S prime:     8.70 cm/s TAPSE (M-mode): 2.5 cm LEFT ATRIUM             Index       RIGHT ATRIUM           Index LA diam:        3.80  cm 2.04 cm/m  RA Pressure: 3.00 mmHg LA Vol (A2C):   34.6 ml 18.56 ml/m RA Area:     16.40 cm LA Vol (A4C):   17.9 ml 9.60 ml/m  RA Volume:   41.10 ml  22.05 ml/m LA Biplane Vol: 25.1 ml 13.47 ml/m  AORTIC VALVE LVOT Vmax:   93.80 cm/s LVOT Vmean:  64.000 cm/s LVOT VTI:    0.227 m  AORTA Ao Root diam: 4.20 cm Ao Asc diam:  3.90 cm MITRAL VALVE                        TRICUSPID VALVE                                     Estimated RAP:  3.00 mmHg  MV Decel Time: 151 msec             SHUNTS MV E velocity: 61.10 cm/s 103 cm/s  Systemic VTI:  0.23 m MV A velocity: 75.20 cm/s 70.3 cm/s Systemic Diam: 2.00 cm MV E/A ratio:  0.81       1.5  Jenkins Rouge MD Electronically signed by Jenkins Rouge MD Signature Date/Time: 07/22/2019/11:36:03 AM    Final        Subjective: Patient reports feeling better. Reports that his brother, who also had been hospitalized with COVID-19 was discharged today and feeling better. He will be discharged home with supervision from his brother.   Discharge Exam: Vitals:   08/19/19 0730 08/19/19 1209  BP:  122/61  Pulse: 77 71  Resp: 20 20  Temp:    SpO2: 95% 97%   Vitals:   08/18/19 2033 08/19/19 0411 08/19/19 0730 08/19/19 1209  BP: 119/68 (!) 143/75  122/61  Pulse: 61 71 77 71  Resp: (!) '24 18 20 20  ' Temp: 99.2 F (37.3 C) 98.9 F (37.2 C)    TempSrc: Oral Oral    SpO2: 96% 97% 95% 97%  Weight:  69.5 kg    Height:        General: Pt is alert, awake, not in acute distress Cardiovascular: RRR, S1/S2 + Respiratory: No respiratory distress, no wheezing.  Abdominal: Soft, NT, ND Extremities: no edema, no cyanosis    The results of significant diagnostics from this hospitalization (including imaging, microbiology, ancillary and laboratory) are listed below for reference.      Microbiology: Recent Results (from the past 240 hour(s))  Novel Coronavirus, NAA ([facility])     Status: Abnormal   Collection Time: 08/10/19  8:49 AM   Specimen: Nasopharyngeal(NP) swabs in vial transport medium   NASOPHARYNGE  TESTING  Result Value Ref Range Status   SARS-CoV-2, NAA Detected (A) Not Detected Final    Comment: This nucleic acid amplification test was developed and its performance characteristics determined by [employer]. Nucleic acid amplification tests include PCR and TMA. This test has not been FDA cleared or approved. This test has been authorized by FDA under an Emergency Use Authorization (EUA). This test is only authorized for the duration of time the declaration that circumstances exist justifying the authorization of the emergency use of in vitro diagnostic tests for detection of SARS-CoV-2 virus and/or diagnosis of COVID-19 infection under section 564(b)(1) of the Act, 21 U.S.C. 856DJS-9(F) (1), unless the authorization is terminated or revoked sooner. When diagnostic testing is negative, the possibility of a false negative result should be considered  in the context of a patient's recent exposures and the presence of clinical signs and symptoms consistent with COVID-19. An individual without symptoms of COVID-19 and who is not shedding SARS-CoV-2 virus would  expect to have a negative (not detected) result in this assay.   Blood Culture (routine x 2)     Status: None   Collection Time: 08/14/19  8:15 PM   Specimen: BLOOD RIGHT HAND  Result Value Ref Range Status   Specimen Description BLOOD RIGHT HAND  Final   Special Requests   Final    BOTTLES DRAWN AEROBIC AND ANAEROBIC Blood Culture adequate volume   Culture   Final    NO GROWTH 5 DAYS Performed at [facility], [address] [place], [place] [address]    Report Status 08/19/2019 FINAL  Final  Blood Culture (routine x 2)     Status: None   Collection Time: 08/14/19  8:22 PM    Specimen: BLOOD  Result Value Ref Range Status   Specimen Description BLOOD LEFT ANTECUBITAL  Final   Special Requests   Final    BOTTLES DRAWN AEROBIC AND ANAEROBIC Blood Culture adequate volume   Culture   Final    NO GROWTH 5 DAYS Performed at [facility], [place] [address], [place], [place] [address]    Report Status 08/19/2019 FINAL  Final     Labs: BNP (last 3 results) Recent Labs    08/14/19 2044  BNP 53.9   Basic Metabolic Panel: Recent Labs  Lab 08/15/19 0605 08/16/19 0709 08/17/19 1050 08/18/19 0346 08/19/19 0500  NA 135 138 137 140 136  K 3.8 3.5 4.2 4.1 4.0  CL 104 109 109 110 107  CO2 20* 20* 21* 19* 19*  GLUCOSE 286* 101* 248* 137* 117*  BUN 37* 33* 37* 41* 32*  CREATININE 1.83* 1.58* 1.71* 1.99* 1.78*  CALCIUM 7.8* 8.1* 8.2* 8.2* 7.9*  MG 1.4*  --  2.0  --   --    Liver Function Tests: Recent Labs  Lab 08/15/19 0605 08/16/19 0709 08/17/19 1050 08/18/19 0346 08/19/19 0500  AST '21 21 28 21 21  ' ALT '19 18 20 15 17  ' ALKPHOS 44 46 60 55 66  BILITOT 0.4 0.2* 0.5 <0.1* 0.7  PROT 5.4* 5.3* 6.5 5.2* 5.6*  ALBUMIN 2.9* 2.7* 3.1* 2.7* 2.9*   No results for input(s): LIPASE, AMYLASE in the last 168 hours. No results for input(s): AMMONIA in the last 168 hours. CBC: Recent Labs  Lab 08/14/19 2044 08/15/19 0605 08/17/19 1050  WBC 5.4 4.2 7.3  NEUTROABS 3.5 2.5  --   HGB 11.0* 10.2* 11.1*  HCT 32.9* 30.4* 33.2*  MCV 92.7 93.0 92.0  PLT 275 254 336   Cardiac Enzymes: Recent Labs  Lab 08/15/19 0605  CKTOTAL 73   BNP: Invalid input(s): POCBNP CBG: Recent Labs  Lab 08/18/19 1204 08/18/19 1741 08/18/19 2125 08/19/19 0801 08/19/19 1209  GLUCAP 141* 160* 154* 107* 201*   D-Dimer Recent Labs    08/18/19 0346 08/19/19 0500  DDIMER 0.37 0.40   Hgb A1c No results for input(s): HGBA1C in the last 72 hours. Lipid Profile No results for input(s): CHOL, HDL, LDLCALC, TRIG, CHOLHDL, LDLDIRECT in the last 72 hours. Thyroid function  studies No results for input(s): TSH, T4TOTAL, T3FREE, THYROIDAB in the last 72 hours.  Invalid input(s): FREET3 Anemia work up No results for input(s): VITAMINB12, FOLATE, FERRITIN, TIBC, IRON, RETICCTPCT in the last 72 hours. Urinalysis No results found  for: COLORURINE, APPEARANCEUR, [place], [place], GLUCOSEU, [place], BILIRUBINUR, [place], PROTEINUR, UROBILINOGEN, NITRITE, LEUKOCYTESUR Sepsis Labs Invalid input(s): PROCALCITONIN,  WBC,  LACTICIDVEN Microbiology Recent Results (from the past 240 hour(s))  Novel Coronavirus, NAA ([facility])     Status: Abnormal   Collection Time: 08/10/19  8:49 AM   Specimen: Nasopharyngeal(NP) swabs in vial transport medium   NASOPHARYNGE  TESTING  Result Value Ref Range Status   SARS-CoV-2, NAA Detected (A) Not Detected Final    Comment: This nucleic acid amplification test was developed and its performance characteristics determined by [employer]. Nucleic acid amplification tests include PCR and TMA. This test has not been FDA cleared or approved. This test has been authorized by FDA under an Emergency Use Authorization (EUA). This test is only authorized for the duration of time the declaration that circumstances exist justifying the authorization of the emergency use of in vitro diagnostic tests for detection of SARS-CoV-2 virus and/or diagnosis of COVID-19 infection under section 564(b)(1) of the Act, 21 U.S.C. 432XMD-4(J) (1), unless the authorization is terminated or revoked sooner. When diagnostic testing is negative, the possibility of a false negative result should be considered in the context of a patient's recent exposures and the presence of clinical signs and symptoms consistent with COVID-19. An individual without symptoms of COVID-19 and who is not shedding SARS-CoV-2 virus would  expect to have a negative (not detected) result in this assay.   Blood Culture (routine x 2)     Status: None   Collection Time: 08/14/19   8:15 PM   Specimen: BLOOD RIGHT HAND  Result Value Ref Range Status   Specimen Description BLOOD RIGHT HAND  Final   Special Requests   Final    BOTTLES DRAWN AEROBIC AND ANAEROBIC Blood Culture adequate volume   Culture   Final    NO GROWTH 5 DAYS Performed at [facility], [address], [place], [place] [address]    Report Status 08/19/2019 FINAL  Final  Blood Culture (routine x 2)     Status: None   Collection Time: 08/14/19  8:22 PM   Specimen: BLOOD  Result Value Ref Range Status   Specimen Description BLOOD LEFT ANTECUBITAL  Final   Special Requests   Final    BOTTLES DRAWN AEROBIC AND ANAEROBIC Blood Culture adequate volume   Culture   Final    NO GROWTH 5 DAYS Performed at [facility], [place] [address], [place], [place] [address]    Report Status 08/19/2019 FINAL  Final     Time coordinating discharge: Over 33 minutes  SIGNED:   Blain Pais, MD  [facility] 08/19/2019, 12:36 PM   If 7PM-7AM, please contact night-coverage [email or web address] Password [email or web address]

## 2019-08-19 NOTE — Progress Notes (Addendum)
Patient was discharged home by MD order; discharged instructions  review and give to patient with care notes; IV DIC; skin intact; patient will be escorted to the car by nurse tech via wheelchair.  

## 2019-08-22 ENCOUNTER — Encounter: Payer: Self-pay | Admitting: Family Medicine

## 2019-08-22 ENCOUNTER — Ambulatory Visit (INDEPENDENT_AMBULATORY_CARE_PROVIDER_SITE_OTHER): Payer: HMO | Admitting: Family Medicine

## 2019-08-22 ENCOUNTER — Other Ambulatory Visit: Payer: Self-pay

## 2019-08-22 DIAGNOSIS — I10 Essential (primary) hypertension: Secondary | ICD-10-CM | POA: Diagnosis not present

## 2019-08-22 DIAGNOSIS — N179 Acute kidney failure, unspecified: Secondary | ICD-10-CM

## 2019-08-22 DIAGNOSIS — E1142 Type 2 diabetes mellitus with diabetic polyneuropathy: Secondary | ICD-10-CM | POA: Diagnosis not present

## 2019-08-22 DIAGNOSIS — E1159 Type 2 diabetes mellitus with other circulatory complications: Secondary | ICD-10-CM

## 2019-08-22 DIAGNOSIS — Z794 Long term (current) use of insulin: Secondary | ICD-10-CM | POA: Diagnosis not present

## 2019-08-22 DIAGNOSIS — I152 Hypertension secondary to endocrine disorders: Secondary | ICD-10-CM

## 2019-08-22 MED ORDER — INSULIN GLARGINE 100 UNIT/ML ~~LOC~~ SOLN: SUBCUTANEOUS | 11 refills | Status: DC

## 2019-08-22 NOTE — Assessment & Plan Note (Signed)
Refilled Lantus 30 units daily.  Continue avoiding Metformin for now although will likely be able to restart once renal function returns to normal.

## 2019-08-22 NOTE — Assessment & Plan Note (Signed)
We will continue holding spironolactone, losartan, and Lasix.  Continue Coreg 3.125 mg twice daily.  Follow-up with me for in person visit in 10 days.  We will readd medications if needed.

## 2019-08-22 NOTE — Progress Notes (Signed)
Chief Complaint:  Jeffrey [name] is a 64 y.o. male who presents today for a TCM visit via telephone  Assessment/Plan:  New/Acute Problems: AKI / COVID 19 Seems to be recovering.  Not able to check bmet today as patient has not been 21 days status post initial positive test.  He will follow-up with me in 10 days and we will recheck be met at that time.  Encouraged good oral hydration.  Chronic Problems Addressed Today: Hypertension associated with diabetes ([place]) We will continue holding spironolactone, losartan, and Lasix.  Continue Coreg 3.125 mg twice daily.  Follow-up with me for in person visit in 10 days.  We will readd medications if needed.  [name] Refilled Lantus 30 units daily.  Continue avoiding Metformin for now although will likely be able to restart once renal function returns to normal.    Subjective:  HPI:   Summary of Hospital admission: Reason for admission: [facility] Date of admission: 08/14/2019 Date of discharge: 08/19/2019 Date of Interactive contact: N/A - today's visit is within 2 business days of discharge Summary of Hospital course: Patient presented to the ED on 08/14/2019 with positive COVID-19 test.  He was having ongoing poor appetite and nausea.  The emergency room was found to have AKI with creatinine of 2.0.  He was admitted for COVID-19 and dehydration with AKI.  While admitted patient was given IV fluids.  His home blood pressure medications including Lasix, spironolactone, and Cozaar were held at the time of discharge.  Interim history outlined by problem:   COVID19  Doing better since being discharged.  Still has weakness.  No fevers or chills.  No shortness of breath.  HTN Currently holding lasix, spironolactone and losartan. He is on coreg 3.148m twice daily and tolerating well.   T2DM Has been on lantus 30 units daily and tolerating well.  Has been out of insulin for the past few days.  He is not currently on  Metformin.  He was told that he needed to stop it while in the hospital.  ROS: Per HPI, otherwise a complete review of systems was negative.   PMH:  The following were reviewed and entered/updated in epic: Past Medical History:  Diagnosis Date  . Cardiac arrest ([facility] 05/22/2016  . CHF (congestive heart failure) ([facility]   . Diabetes mellitus without complication ([facility]   . Diabetic retinopathy ([facility]   . Hyperlipidemia   . Hypertension   . Myocardial infarct ([facility] 2105  . S/P primary angioplasty with coronary stent 05/22/2016  . Vitamin B12 deficiency    Patient Active Problem List   Diagnosis Date Noted  . COVID-19 virus detected 08/15/2019  . AKI (acute kidney injury) ([facility] 08/15/2019  . Unintentional weight loss with loose stools 05/19/2019  . Low vitamin B12 level 03/29/2019  . Heme positive stool 03/14/2019  . Normocytic anemia 03/14/2019  . Chronic combined systolic and diastolic heart failure ([facility] 02/08/2019  . Diabetic retinopathy ([facility] 09/08/2018  . Physical debility 05/24/2018  . Varicose veins of both lower extremities 03/01/2018  . Fatigue due to exertion 10/14/2017  . GERD (gastroesophageal reflux disease) 08/25/2017  . CAD S/P percutaneous coronary angioplasty 04/14/2017  . Hyperlipidemia associated with type 2 diabetes mellitus ([facility] 04/14/2017  . Diabetic peripheral neuropathy associated with type 2 diabetes mellitus ([facility] 08/17/2016  . T2DM (type 2 diabetes mellitus) ([facility] 05/02/2016  . Hypertension associated with diabetes ([facility] 05/02/2016   Past Surgical History:  Procedure Laterality Date  . ANGIOPLASTY  2015  . REFRACTIVE SURGERY    . VITRECTOMY      Family History  Problem Relation Age of Onset  . Alzheimer's disease Mother   . Heart disease Father   . Peripheral Artery Disease Father   . Heart disease Brother   . Colon polyps Neg Hx   . Prostate cancer Neg Hx   . Rectal cancer Neg Hx   . Esophageal cancer Neg Hx     Medications-  Reconciled discharge and current medications in Epic.  Current Outpatient Medications  Medication Sig Dispense Refill  . aspirin EC 81 MG tablet Take 81 mg by mouth daily.    [name] Kitchen atorvastatin (LIPITOR) 80 MG tablet TAKE 1 TABLET BY MOUTH EVERY DAY 90 tablet 2  . Blood Glucose Monitoring Suppl (ONETOUCH VERIO IQ SYSTEM) w/Device KIT 1 kit by Does not apply route 4 (four) times daily. 1 kit 0  . carvedilol (COREG) 3.125 MG tablet Take 1 tablet (3.125 mg total) by mouth 2 (two) times daily with a meal. 180 tablet 1  . cholecalciferol (VITAMIN D3) 25 MCG (1000 UT) tablet Take 1,000 Units by mouth daily.    [name] Kitchen glucose blood test strip Check blood sugar 4 times per day 200 each 11  . Insulin Syringe-Needle U-100 (B-D INS SYR HALF-UNIT .3CC/31G) 31G X 5/16" 0.3 ML MISC Use 6 times a day 200 each 11  . OneTouch Delica Lancets 65K MISC Use to check blood sugar 4 times per day 200 each 11  . vitamin B-12 100 MCG tablet Take 1 tablet (100 mcg total) by mouth daily. (Patient taking differently: Take 1,000 mcg by mouth daily. ) 30 tablet 0  . insulin glargine (LANTUS) 100 UNIT/ML injection Inject 15-28 units twice daily. 30 mL 11   No current facility-administered medications for this visit.    Allergies-reviewed and updated Allergies  Allergen Reactions  . Codeine Nausea And Vomiting  . Penicillins Rash    Did it involve swelling of the face/tongue/throat, SOB, or low BP? no Did it involve sudden or severe rash/hives, skin peeling, or any reaction on the inside of your mouth or nose? yes Did you need to seek medical attention at a hospital or doctor's office? yes When did it last happen?childhood If all above answers are "NO", may proceed with cephalosporin use.     Social History   Socioeconomic History  . Marital status: Single    Spouse name: Not on file  . Number of children: Not on file  . Years of education: Not on file  . Highest education level: Not on file  Occupational History    . Not on file  Tobacco Use  . Smoking status: Never Smoker  . Smokeless tobacco: Never Used  Substance and Sexual Activity  . Alcohol use: Not Currently    Comment: occ  . Drug use: No  . Sexual activity: Never  Other Topics Concern  . Not on file  Social History Narrative  . Not on file   Social Determinants of Health   Financial Resource Strain:   . Difficulty of Paying Living Expenses: Not on file  Food Insecurity:   . Worried About Charity fundraiser in the Last Year: Not on file  . Ran Out of Food in the Last Year: Not on file  Transportation Needs:   . Lack of Transportation (Medical): Not on file  . Lack of Transportation (Non-Medical): Not on file  Physical Activity:   . Days of Exercise per Week:  Not on file  . Minutes of Exercise per Session: Not on file  Stress:   . Feeling of Stress : Not on file  Social Connections:   . Frequency of Communication with Friends and Family: Not on file  . Frequency of Social Gatherings with Friends and Family: Not on file  . Attends Religious Services: Not on file  . Active Member of Clubs or Organizations: Not on file  . Attends Archivist Meetings: Not on file  . Marital Status: Not on file        Objective:  NAD  Telephone Visit   I connected with Jeffrey [name] on 08/22/19 at  9:20 AM EST via telephone and verified that I am speaking with the correct person using two identifiers. I discussed the limitations of evaluation and management by telemedicine and the availability of in person appointments. The patient expressed understanding and agreed to proceed.   Patient location: Home Provider location: [place] participating in the virtual visit: Myself and Patient      [name]. [name], MD 08/22/2019 9:52 AM

## 2019-08-22 NOTE — Patient Outreach (Signed)
  [facility]) Care Management Chronic Special Needs Program  08/22/2019  Name: Jeffrey [name] DOB: 03/08/1956  MRN: [id number]  [name] is enrolled in a chronic special needs plan for Diabetes. Client admitted to    [facility]  on 08/14/19 with  COVID.  Client discharged on 08/19/19. Reviewed and updated individualized care plan.  Transition of care to be completed by [facility] general discharge.        Goals Addressed            This Visit's Progress   . Decrease inpatient admissions/ readmissions with in the next year      . General - Client will not be readmitted within 30 days (C-SNP)       Client discharged on 08/19/19 Please follow discharge instructions and call provider if you have any questions. Please attend all follow up appointments as scheduled. Please take your medications as prescribed. Please call 24 Hour nurse advice line as needed [phone]).        Plan:  RNCM will send updated individualized care plan to client and  primary care provider.  RNCM will continue to follow and collaborate/ care coordinate as needed.   Chronic care management coordination will outreach in:  1 month   Quinn Plowman RN,BSN,CCM [place] Management [place] Case Manager, C-SNP

## 2019-08-23 ENCOUNTER — Other Ambulatory Visit: Payer: Self-pay | Admitting: Family Medicine

## 2019-08-23 ENCOUNTER — Telehealth: Payer: Self-pay

## 2019-08-23 NOTE — Telephone Encounter (Signed)
Patient is calling to verify what medication should he be taking. Patient states that he cant remember what dr [name] told him on his last visit because everyone keeps telling him different things. Patient would like for someone to call him back today

## 2019-08-24 NOTE — Telephone Encounter (Signed)
Spoke with patients,reviewed medications. Patient voices understanding

## 2019-08-25 ENCOUNTER — Ambulatory Visit: Payer: Self-pay

## 2019-08-26 ENCOUNTER — Other Ambulatory Visit: Payer: Self-pay

## 2019-08-26 ENCOUNTER — Ambulatory Visit: Payer: Self-pay

## 2019-08-26 NOTE — Patient Outreach (Signed)
  [facility]) Care Management Chronic Special Needs Program    08/26/2019  Name: Jeffrey [name], DOB: [birth year]  MRN: [id number]   [name] is enrolled in a chronic special needs plan for Diabetes.Telephone call to client for initial assessment/ HRA review. Unable to reach. HIPAA compliant voice message left with call back phone number and return call request.   PLAN; RNCM will attempt 2nd telephone call to client within 1 week.Quinn Plowman RN,BSN,CCM [place] Management [phone]

## 2019-08-29 ENCOUNTER — Telehealth: Payer: Self-pay

## 2019-08-29 ENCOUNTER — Other Ambulatory Visit: Payer: Self-pay

## 2019-08-29 ENCOUNTER — Telehealth: Payer: Self-pay | Admitting: Cardiovascular Disease

## 2019-08-29 NOTE — Telephone Encounter (Signed)
Noted. Will see him at his OV.  [name]. [name], MD 08/29/2019 3:39 PM

## 2019-08-29 NOTE — Patient Outreach (Signed)
  [facility]) Care Management Chronic Special Needs Program    08/29/2019  Name: Kodey Soares, DOB: 10-17-1955  MRN: [id number]   Mr. Paige Lacour is enrolled in a chronic special needs plan for Diabetes.  Telephone call to client to for initial assessment/ health risk assessment review.  HIPAA verified with client. Client states he called the 24 hour nurse advise line over the weekend. He reports while in the hospital last week his routine medications were discontinued. Client states he did not know whether he was to start taking these medications again. Client states his feet were swollen considerably and his blood pressure was elevated over the weekend. He reports having to stay in his recliner for the majority of the weekend to get the swelling down. Client states," I'm not sure if I'm supposed to start taking my medications or not. I didn't take my fluid or blood pressure pills."  Client reports calling the 24 hour nurse advise line. He states he was advised by the 24 hour nurse advise line nurse  that they would fax over his symptoms and concerns to his doctors office. Client states he is scheduled to follow up with his primary care provider on 09/03/19.  RNCM advised client to call his primary MD office immediately this morning to notify them of his symptoms.  Client verbalized understanding. Client verbally agreed to call back from [facility] later today.  PLAN; RNCM will contact client within 3 business days.   Quinn Plowman RN,BSN,CCM [place] Management [phone]

## 2019-08-29 NOTE — Telephone Encounter (Signed)
Returned call to patient of Dr. Oval Linsey who was recently in the hospital. He had AKI and metformin, lasix, spironolactone, losartan were held. He reports he has not tolerated metformin well in the period of time he has been on it - he thinks this medication is an issue.   Patient reports bilateral pedal edema, he has to elevate legs. Some nights he has to sleep in recliner.  He does not weigh. He reports he is urinating well, filling up 2 urinals with clear urine during the night. He reports his BP is out of control - he reports his BP cuff says stage 3/4 hypertension. This morning his BP was 139/54 and HR 88 and BP 120/56 yesterday.   He reports watching salt/sodium in diet. Advised to wear compression/stockings - he has diabetic socks   He reports he has not been able to get a response concerning same issue from PCP but patient had telehealth visit on Jan 11 and these things were addressed. He has an OV with PCP Jan 21.   Advised will route to MD to review/advise

## 2019-08-29 NOTE — Telephone Encounter (Signed)
Patient called stating he was recently in the hospital and they stopped his furosemide (LASIX) 40 MG tablet, spironolactone (ALDACTONE) 25 MG tablet, and losartan (COZAAR) 25 MG tablet.  He wants to know if he should restart taking this medications again.  He stated when he was in the hospital they stopped the medication due to kidney failure. He states and this feet and swollen and his BP is elevated.    This morning BP:  139/54 HR 88

## 2019-08-29 NOTE — Telephone Encounter (Signed)
Patient called in regarding swelling of feet, spots and the medication that was stop. Patient states that Dr. [name] is aware of the medications that was stop and he is getting very tired of Dr. Ellwood Handler office. Patient spoke with someone regarding this concern over weekend he was advised to call office and set up appt. Offered patient the next virtual appt 08/30/19 at 11:20am and pt declined.Patient would to talk to the nurse today regarding this swelling and spots.Patient is on  The schedule for 09/01/19 at 8:20am

## 2019-08-29 NOTE — Telephone Encounter (Signed)
Spoke with patient complains of swollen feet,big toe is painful with touch.Patient stated he will not continue metformin. Patient was informed to restart lasix per Dr.Parker.Patient  voices understanding

## 2019-08-30 ENCOUNTER — Other Ambulatory Visit: Payer: Self-pay

## 2019-08-30 NOTE — Telephone Encounter (Signed)
Agree with Dr. Marigene Ehlers recommendation to resume lasix.  He will address hi diabetes meds.  BP control is reasonable  Noo change for now.

## 2019-08-30 NOTE — Telephone Encounter (Signed)
Patient aware of MD recommendations/advice. He has OV with PCP on Jan 21

## 2019-08-30 NOTE — Patient Outreach (Signed)
Jeffrey [name] [facility]) Care Management Chronic Special Needs Program  08/31/2019  Name: Jeffrey [name] DOB: 1956-07-26  MRN: [id number]  Jeffrey [name] is enrolled in a chronic special needs plan for Diabetes. Chronic Care Management Coordinator telephoned client to review health risk assessment and to develop individualized care plan.  Introduced the chronic care management program, importance of client participation, and taking their care plan to all provider appointments and inpatient facilities.    Subjective: Telephone call to patient. HIPAA verified.  Client states he spoke with his primary MD office on Monday 08/29/19 and his doctor restarted his lasix fluid pill to help deal with his swelling feet and ankles. Client states the swelling is not gone completely but has gone down considerably. Client states he was in the hospital approximately 1 1/2 to 2 weeks ago for COVID. Client states he is doing better but is still weak. Client states he would like to have physical therapy. He states he will discuss this with his doctor at his upcoming appointment. Client states he is very concerned about his medications and his diabetes. Client states he was taken off several of his routine medications while he was in the hospital. Client states he was taken off of his oral diabetic and blood pressure medicine. Client states his blood sugars have been out of control.  He reports today's fasting blood sugar was 338. Client states he is unsure what his exact A1c is but states it ranges between 7.0 - 8.0.  He reports his blood pressure this am was 140/63.  Client states, "my diabetes is not being managed."  Client states he only has 30 units of Lantas to take which is not managing his diabetes.Client states he is disgusted because he cannot take anything to correct his elevated blood pressures.  RNCM advised client to contact his doctor's office regarding his medication concern and blood sugar readings.  Client states he has done this and he has a follow up appointment scheduled for 08/31/18 with his primary MD. Client states, " I just want to know what the plan is going to be about managing my diabetes and if I'm going to start back on my blood pressure medicine."  Client reports he sees his cardiologist regularly reporting last visit with cardiologist was December 2020. He reports he had an echocardiogram done at that time.  Client reports he is weighing himself 1 time per week. RNCM discussed signs/ symptoms of heart failure. Advised of importance of weighing daily and recording.  Client verbalized understanding.  Client reports he has good family support from his twin brother. He reports having transportation to his appointments.   Goals    . Client understands the importance of follow-up with providers by attending scheduled visits     Client reports adherence to provider appointments.     . Client verbalize knowledge of Heart Failure disease self management skills within 6 months     RNCM will send client EMMI education article: Heart Failure: Working with your doctor Signs and symptoms of heart failure reviewed.  Advisded to notify doctor for symptoms Access 911 for severe symptoms Weigh daily and record weights Adhere to low salt diet    . Client verbalizes knowledge of Heart Attack self management skills within 6 months    . Client will report no worsening of symptoms related to heart disease within the next 6 months    . Client will verbalize knowledge of self management of Hypertension as evidences by BP reading of  140/90 or less; or as defined by provider     Assessed high blood pressure self management skills.  Ensured client has a home blood pressure monitor.  Advised client to monitor blood pressure at least 3 times per week Discussed/ Reviewed blood pressure targets. Take medication as prescribed by doctor RNCM mailed EMM education article: High Blood pressure: What you can do     . Decrease inpatient admissions/ readmissions with in the next year    . General - Client will not be readmitted within 30 days (C-SNP)     Client discharged on 08/19/19 Please follow discharge instructions and call provider if you have any questions. Please attend all follow up appointments as scheduled. Please take your medications as prescribed. Please call 24 Hour nurse advice line as needed [phone]).     [name] Kitchen HEMOGLOBIN A1C < 7.0     Strategies to improve Hgb A1c / blood sugar control: Good medication taking behavior Carbohydrate controlled meal planning Being active    . Maintain timely refills of diabetic medication as prescribed within the year .    . Maintain timely refills of Heart Failure medication as prescribed within the year     . Obtain annual  Lipid Profile, LDL-C    . Obtain Annual Eye (retinal)  Exam     . Obtain Annual Foot Exam    . Obtain annual screen for micro albuminuria (urine) , nephropathy (kidney problems)    . Obtain Hemoglobin A1C at least 2 times per year    . Visit Primary Care Provider or Cardiologist at least 2 times per year    . Visit Primary Care Provider or Endocrinologist at least 2 times per year        PHQ 2: 4 and PHQ9: 8.  RNCM offered follow up with [facility] care management social work due to anxiety/ depression symptoms. Client declined stating his anxiety is due to not having his diabetes managed or being back on his routine medications. Client states the anxiety will resolve once these things are taken care of.  Client is not meeting diabetes self-management goal of hemoglobin A1C of <7.0% with most recent reading of 8.8% on 05/19/19 without reports of hypoglycemia . Client has good understanding of:  COVID-19 cause, symptoms, precautions (social distancing, stay at home order, hand washing, wear face covering when unable to maintain or ensure 6 foot social distancing), and symptoms requiring provider notification. RNCM advised client to  notify MD of any changes in condition prior to scheduled appointment. Client advised to contact RNCM as needed and contact their HTA concierge for benefit questions.  RNCM provided client 24 hour HTA nurse advise line number [phone]    Plan:  Send successful outreach letter with a copy of their individualized care plan, Send individual care plan to provider and Send educational material: High blood pressure: What you can do, Heart Failure: Working with your doctor, Heart disease in diabetes.  Chronic care management coordination will outreach in:  1 week for follow up post primary MD appointment.      Quinn Plowman RN,BSN,CCM [place] Management [phone]

## 2019-09-01 ENCOUNTER — Ambulatory Visit (INDEPENDENT_AMBULATORY_CARE_PROVIDER_SITE_OTHER): Payer: HMO | Admitting: Family Medicine

## 2019-09-01 ENCOUNTER — Other Ambulatory Visit: Payer: Self-pay

## 2019-09-01 ENCOUNTER — Encounter: Payer: Self-pay | Admitting: Family Medicine

## 2019-09-01 VITALS — BP 138/62 | HR 75 | Temp 97.2°F | Ht 70.0 in | Wt 156.2 lb

## 2019-09-01 DIAGNOSIS — Z794 Long term (current) use of insulin: Secondary | ICD-10-CM

## 2019-09-01 DIAGNOSIS — E1142 Type 2 diabetes mellitus with diabetic polyneuropathy: Secondary | ICD-10-CM | POA: Diagnosis not present

## 2019-09-01 DIAGNOSIS — I10 Essential (primary) hypertension: Secondary | ICD-10-CM | POA: Diagnosis not present

## 2019-09-01 DIAGNOSIS — N179 Acute kidney failure, unspecified: Secondary | ICD-10-CM | POA: Diagnosis not present

## 2019-09-01 DIAGNOSIS — R5381 Other malaise: Secondary | ICD-10-CM

## 2019-09-01 DIAGNOSIS — E1159 Type 2 diabetes mellitus with other circulatory complications: Secondary | ICD-10-CM | POA: Diagnosis not present

## 2019-09-01 DIAGNOSIS — I152 Hypertension secondary to endocrine disorders: Secondary | ICD-10-CM

## 2019-09-01 LAB — COMPREHENSIVE METABOLIC PANEL
ALT: 40 U/L (ref 0–53)
AST: 17 U/L (ref 0–37)
Albumin: 4 g/dL (ref 3.5–5.2)
Alkaline Phosphatase: 73 U/L (ref 39–117)
BUN: 44 mg/dL — ABNORMAL HIGH (ref 6–23)
CO2: 26 mEq/L (ref 19–32)
Calcium: 9 mg/dL (ref 8.4–10.5)
Chloride: 99 mEq/L (ref 96–112)
Creatinine, Ser: 1.67 mg/dL — ABNORMAL HIGH (ref 0.40–1.50)
GFR: 41.52 mL/min — ABNORMAL LOW (ref >60.00)
Glucose, Bld: 473 mg/dL — ABNORMAL HIGH (ref 70–99)
Potassium: 5 mEq/L (ref 3.5–5.1)
Sodium: 132 mEq/L — ABNORMAL LOW (ref 135–145)
Total Bilirubin: 0.4 mg/dL (ref 0.2–1.2)
Total Protein: 6.8 g/dL (ref 6.0–8.3)

## 2019-09-01 LAB — CBC
HCT: 32.3 % — ABNORMAL LOW (ref 39.0–52.0)
Hemoglobin: 11 g/dL — ABNORMAL LOW (ref 13.0–17.0)
MCHC: 34 g/dL (ref 30.0–36.0)
MCV: 91.8 fl (ref 78.0–100.0)
Platelets: 518 10*3/uL — ABNORMAL HIGH (ref 150.0–400.0)
RBC: 3.51 Mil/uL — ABNORMAL LOW (ref 4.22–5.81)
RDW: 13.6 % (ref 11.5–15.5)
WBC: 10.1 10*3/uL (ref 4.0–10.5)

## 2019-09-01 LAB — HEMOGLOBIN A1C: Hgb A1c MFr Bld: 9.1 % — ABNORMAL HIGH (ref 4.6–6.5)

## 2019-09-01 MED ORDER — JARDIANCE 10 MG PO TABS: 10.0000 mg | ORAL_TABLET | Freq: Every day | ORAL | 3 refills | Status: DC

## 2019-09-01 NOTE — Assessment & Plan Note (Signed)
Check c-Met today. 

## 2019-09-01 NOTE — Patient Instructions (Signed)
It was very nice to see you today!  We will start Jardiance today.  This will help with your blood sugars.  No other medication changes today.  We will not restart your Metformin, spironolactone, or losartan today.  We will check blood work.  I will place a referral for you to see our physical therapist.  Come back to see me in 3 months, or sooner if needed.  Take care, Dr [name]  Please try these tips to maintain a healthy lifestyle:   Eat at least 3 REAL meals and 1-2 snacks per day.  Aim for no more than 5 hours between eating.  If you eat breakfast, please do so within one hour of getting up.    Each meal should contain half fruits/vegetables, one quarter protein, and one quarter carbs (no bigger than a computer mouse)   Cut down on sweet beverages. This includes juice, soda, and sweet tea.     Drink at least 1 glass of water with each meal and aim for at least 8 glasses per day   Exercise at least 150 minutes every week.   We are committed to keeping you informed about the COVID-19 vaccine.  As the vaccine continues to become available for each phase, we will ensure that patients who meet the criteria receive the information they need to access vaccination opportunities. Continue to check your MyChart account and [email or web address] for updates. Please review the Phase 1b information below.  Following [place]'s guidelines for the distribution of COVID-19 vaccines we are pleased to share our plans to begin offering vaccines to those 65 and older (Phase 1b). Here are details of those plans:  [place] On Tuesday, Jan. 19, the [facility]) and [place] begin large-scale COVID-19 vaccinations at the [place]. The vaccinations are appointment only and for those 59 and older.  Walk-ins will not be accepted.  All appointments are currently  filled. Please join our waiting list for the next available appointments. We will contact you when appointments become available. Please do not sign up more than once.  Join Our Waiting List   Other Vaccination Opportunities in [place] We are also working in partnership with county health agencies in our service counties to ensure continuing vaccination availability in the weeks and months ahead. Learn more about each county's vaccination efforts in the website links below:   [place]'s phase 1b vaccination guidelines, prioritizing those 65 and over as the next eligible group to receive the COVID-19 vaccine, are detailed at MobCommunity.ch.   Vaccine Safety and Effectiveness Clinical trials for the Pfizer COVID-19 vaccine involved 42,000 people and showed that the vaccine is more than 95% effective in preventing COVID-19 with no serious safety concerns. Similar results have been reported for the Moderna COVID-19 vaccine. Side effects reported in the [place] clinical trials include a sore arm at the injection site, fatigue, headache, chills and fever. While side effects from the [place] COVID-19 vaccine are higher than for a typical flu vaccine, they are lower in many ways than side effects from the leading vaccine to prevent shingles. Side effects are signs that a vaccine is working and are related to your immune system being stimulated to produce antibodies against infection. Side effects from vaccination are far less significant than health impacts from COVID-19.  Staying Informed Pharmacists, infectious disease doctors, critical care  nurses and other experts at [facility] continue to speak publicly through media interviews and direct communication with our patients and communities about the safety, effectiveness and importance of vaccines to eliminate COVID-19. In addition, reliable information on vaccine safety,  effectiveness, side effects and more is available on the following websites:  [place] Department of Health and Human Services COVID-19 Vaccine Information Website.  U.S. Centers for Disease Control and Prevention XX123456 Human resources officer.  Staying Safe We agree with the CDC on what we can do to help our communities get back to normal: Getting "back to normal" is going to take all of our tools. If we use all the tools we have, we stand the best chance of getting our families, communities, schools and workplaces "back to normal" sooner:  Get vaccinated as soon as vaccines become available within the phase of the state's vaccination rollout plan for which you meet the eligibility criteria.  Wear a mask.  Stay 6 feet from others and avoid crowds.  Wash hands often.  For our most current information, please visit [email or web address].

## 2019-09-01 NOTE — Assessment & Plan Note (Signed)
He has restarted Lasix and is doing well this.  Blood pressures at goal.  Continue Coreg 3.125 mg twice daily.  We will continue holding losartan and spironolactone given good blood pressure control.

## 2019-09-01 NOTE — Progress Notes (Signed)
   Jeffrey [name] is a 64 y.o. male who presents today for an office visit.  Assessment/Plan:  Chronic Problems Addressed Today: Hypertension associated with diabetes ([place]) He has restarted Lasix and is doing well this.  Blood pressures at goal.  Continue Coreg 3.125 mg twice daily.  We will continue holding losartan and spironolactone given good blood pressure control.  T2DM (type 2 diabetes mellitus) ([place]) Sugars well above goal.  Had extensive discussion regarding Metformin.  Patient is very reluctant to take this as he thinks it was causing his dehydration and kidney issues -reiterated the patient that was likely due to his Covid pneumonia as well as diuretics and because of dehydration.  He does not wish to restart Metformin at this time.  We will continue Lantus 30 units daily.  Also start Jardiance '10mg'$  daily.   AKI (acute kidney injury) ([name]) Check c-Met today.  Physical debility Place referral to physical therapy.    Subjective:  HPI:  Patient had virtual hospital follow-up 10 days ago for Covid pneumonia and AKI.  Over the last few days he has noticed increased swelling in his legs and has restarted his Lasix.  Swelling has improved.  He is currently holding his spironolactone, Metformin, and losartan.  He is afraid to restart these medications due to recurrent bouts of dehydration.  Blood sugars have been in the 100s to 300s.  He is taking Lantus 30 units daily.  Blood pressures usually in the 110s over 50s to 130s over 60s.  He is interested in seeing physical therapy for generalized weakness and debility.  He is having more difficulty with ambulation after being discharged from the hospital.       Objective:  Physical Exam: BP 138/62   Pulse 75   Temp (!) 97.2 F (36.2 C)   Ht '5\' 10"'$  (1.778 m)   Wt 156 lb 4 oz (70.9 kg)   SpO2 98%   BMI 22.42 kg/m   Wt Readings from Last 3 Encounters:  09/01/19 156 lb 4 oz (70.9 kg)  08/19/19 153 lb 4.8 oz (69.5 kg)    07/11/19 161 lb (73 kg)  Gen: No acute distress, resting comfortably CV: Regular rate and rhythm with no murmurs appreciated Pulm: Normal work of breathing, clear to auscultation bilaterally with no crackles, wheezes, or rhonchi MSK: Back with my centimeter cystic lesion. 1+ pitting edema to ankles bilaterally. Neuro: Grossly normal, moves all extremities Psych: Normal affect and thought content  Time Spent: 45 minutes of total time was spent on the date of the encounter performing the following actions: chart review prior to seeing the patient, obtaining history, performing a medically necessary exam, counseling on the treatment plan, placing orders, and documenting in our EHR.        [name]. [name], MD 09/01/2019 9:22 AM

## 2019-09-01 NOTE — Assessment & Plan Note (Addendum)
Sugars well above goal.  Had extensive discussion regarding Metformin.  Patient is very reluctant to take this as he thinks it was causing his dehydration and kidney issues -reiterated the patient that was likely due to his Covid pneumonia as well as diuretics and because of dehydration.  He does not wish to restart Metformin at this time.  We will continue Lantus 30 units daily.  Also start Jardiance 10mg  daily.

## 2019-09-01 NOTE — Assessment & Plan Note (Signed)
Place referral to physical therapy.

## 2019-09-01 NOTE — Progress Notes (Signed)
Please inform patient of the following:  A1c elevated to 9.1.  This should improve with starting the Jardiance as we discussed at his visit.   His kidney function is improving.  Recommend that he continue to stay well-hydrated and drink plenty of fluids.  All his other labs are stable.We do not need to make any other changes today.  Would like him to keep up the good work and we can recheck in 3 months.

## 2019-09-02 ENCOUNTER — Other Ambulatory Visit: Payer: Self-pay

## 2019-09-02 ENCOUNTER — Telehealth: Payer: Self-pay

## 2019-09-02 MED ORDER — FUROSEMIDE 40 MG PO TABS: 40.0000 mg | ORAL_TABLET | Freq: Every day | ORAL | 3 refills | Status: DC

## 2019-09-02 NOTE — Telephone Encounter (Signed)
Rx sent 

## 2019-09-02 NOTE — Telephone Encounter (Signed)
MEDICATION:furosemide (LASIX) 40 MG tablet  PHARMACY: [id number] [address], [place] Phone:  [phone]  Fax:  [phone]       Comments:   **Let patient know to contact pharmacy at the end of the day to make sure medication is ready. **  ** Please notify patient to allow 48-72 hours to process**  **Encourage patient to contact the pharmacy for refills or they can request refills through [facility]**

## 2019-09-05 ENCOUNTER — Other Ambulatory Visit: Payer: Self-pay

## 2019-09-05 NOTE — Patient Outreach (Addendum)
[facility]) Care Management Chronic Special Needs Program    09/05/2019  Name: Jeffrey [name], DOB: Aug 11, 1956  MRN: [id number]   [name] is enrolled in a chronic special needs plan for Diabetes. Call to client for follow up assessment. HIPAA verified. Client states he is doing good today. He reports having his follow up appointment with his primary MD last week. He states his doctor added an oral diabetic medication, Jardiance to his treatment regime and continued to hold his blood pressure medication. Client reports his fasting blood sugar reading today was 124 and his blood pressure was 102/51, pulse 79. Client denies any symptoms of dizziness.  Client states he has his physical therapy evaluation on tomorrow 09/06/19. Client states he is scheduled for a follow up with his primary MD on 12/01/19.  Client reports he is also registered for his [place] vaccination. He states he feels much better about things overall.  Client verbalized appreciation for follow up call and verbalized agreement to next telephone outreach with [facility].   Goals    . Client understands the importance of follow-up with providers by attending scheduled visits     Client reports adherence to provider appointments.     . Client verbalize knowledge of Heart Failure disease self management skills within 6 months     RNCM will send client EMMI education article: Heart Failure: Working with your doctor Signs and symptoms of heart failure reviewed.  Advisded to notify doctor for symptoms Access 911 for severe symptoms Weigh daily and record weights Adhere to low salt diet    . Client verbalizes knowledge of Heart Attack self management skills within 6 months     RN case manager discussed heart attack signs/ symptoms.  Advised to call 911 for symptoms Client advised to keep scheduled appointments with provider Client will take medications as prescribed.  Client knows when to call the doctor/ which  doctor to call.     . Client will report no worsening of symptoms related to heart disease within the next 6 months    . Client will verbalize knowledge of self management of Hypertension as evidences by BP reading of 140/90 or less; or as defined by provider     Assessed high blood pressure self management skills.  Ensured client has a home blood pressure monitor.  Advised client to monitor blood pressure at least 3 times per week Discussed/ Reviewed blood pressure targets. Take medication as prescribed by doctor RNCM mailed EMM education article: High Blood pressure: What you can do    . Decrease inpatient admissions/ readmissions with in the next year    . General - Client will not be readmitted within 30 days (C-SNP)     Client discharged on 08/19/19 Please follow discharge instructions and call provider if you have any questions. Please attend all follow up appointments as scheduled. Please take your medications as prescribed. Please call 24 Hour nurse advice line as needed [phone]).     [name] Kitchen HEMOGLOBIN A1C < 7.0     Strategies to improve Hgb A1c / blood sugar control: Good medication taking behavior Carbohydrate controlled meal planning Being active    . Maintain timely refills of diabetic medication as prescribed within the year .    [name] Kitchen Obtain annual  Lipid Profile, LDL-C    . Obtain Annual Eye (retinal)  Exam     . Obtain Annual Foot Exam    . Obtain annual screen for micro albuminuria (urine) , nephropathy (kidney problems)    .  Obtain Hemoglobin A1C at least 2 times per year    . Patient stated goal: Client to start physical therapy within 3 weeks (pt-stated)     Client to request physical therapy services at his 08/31/18 post hospital follow up appointment with primary care provider.     . Visit Primary Care Provider or Endocrinologist at least 2 times per year        PLAN; RNCM will follow up with client within 6 months.   Quinn Plowman RN,BSN,CCM [facility] [phone]

## 2019-09-06 ENCOUNTER — Encounter: Payer: Self-pay | Admitting: Physical Therapy

## 2019-09-06 ENCOUNTER — Ambulatory Visit: Payer: HMO | Admitting: Physical Therapy

## 2019-09-06 ENCOUNTER — Other Ambulatory Visit: Payer: Self-pay

## 2019-09-06 DIAGNOSIS — M6281 Muscle weakness (generalized): Secondary | ICD-10-CM

## 2019-09-06 DIAGNOSIS — R2689 Other abnormalities of gait and mobility: Secondary | ICD-10-CM

## 2019-09-09 ENCOUNTER — Encounter: Payer: Self-pay | Admitting: Family Medicine

## 2019-09-10 ENCOUNTER — Encounter: Payer: Self-pay | Admitting: Family Medicine

## 2019-09-12 ENCOUNTER — Encounter: Payer: HMO | Admitting: Physical Therapy

## 2019-09-12 ENCOUNTER — Other Ambulatory Visit: Payer: Self-pay

## 2019-09-12 DIAGNOSIS — I83813 Varicose veins of bilateral lower extremities with pain: Secondary | ICD-10-CM

## 2019-09-12 NOTE — Progress Notes (Signed)
d 

## 2019-09-14 ENCOUNTER — Encounter: Payer: HMO | Admitting: Physical Therapy

## 2019-09-15 ENCOUNTER — Encounter: Payer: Self-pay | Admitting: Family Medicine

## 2019-09-17 ENCOUNTER — Encounter: Payer: Self-pay | Admitting: Family Medicine

## 2019-09-19 ENCOUNTER — Other Ambulatory Visit: Payer: Self-pay

## 2019-09-19 ENCOUNTER — Encounter: Payer: Self-pay | Admitting: Family Medicine

## 2019-09-19 DIAGNOSIS — I83813 Varicose veins of bilateral lower extremities with pain: Secondary | ICD-10-CM | POA: Diagnosis not present

## 2019-09-19 DIAGNOSIS — E1142 Type 2 diabetes mellitus with diabetic polyneuropathy: Secondary | ICD-10-CM

## 2019-09-19 DIAGNOSIS — Z794 Long term (current) use of insulin: Secondary | ICD-10-CM

## 2019-09-19 NOTE — Patient Outreach (Signed)
  [facility]) Care Management Chronic Special Needs Program    09/19/2019  Name: Jeffrey [name], DOB: 1955/12/12  MRN: [id number]   [name] is enrolled in a chronic special needs plan for Diabetes. Returned call to client. HIPAA verified. Client and his brother on call with RNCM. Client gives verbal permission for his brother, Nason Grindstaff to be on call.  Client states he wants to see an endocrinologist for his diabetes. He states he sent a request to his primary MD through his MyChart account requesting a referral to endocrinologist Dr. Chalmers Cater.  Client and brother stated  they reviewed client's medical information in MyChart and became aware he had a diagnosis of acute renal failure. Client expressed a lack of knowledge related to this condition. Client states he and his brother sent his primary care provider a request through [place] asking for information regarding Acute renal failure.  RNCM discussed acute renal failure with client and brother and offered to send an education article for review.  Client and brother voiced appreciation. Client states he wanted to contact this RNCM to provide this update.   PLAN:  RNCM will follow up with client at next scheduled tier outreach.   Quinn Plowman RN,BSN,CCM [place] [facility] [phone]

## 2019-09-20 ENCOUNTER — Encounter: Payer: Self-pay | Admitting: Family Medicine

## 2019-09-24 ENCOUNTER — Encounter: Payer: Self-pay | Admitting: Family Medicine

## 2019-09-26 ENCOUNTER — Encounter: Payer: Self-pay | Admitting: Physical Therapy

## 2019-09-26 NOTE — Therapy (Signed)
[facility] [address] [place], [place], [address] Phone: [phone]   Fax:  [phone]  Physical Therapy Evaluation  Patient Details  Name: Jeffrey [name] MRN: [id number] Date of Birth: 01/04/1956 Referring Provider (PT): Dimas Chyle   Encounter Date: 09/06/2019  PT End of Session - 09/26/19 1129    Visit Number  1    Number of Visits  12    Date for PT Re-Evaluation  10/18/19    Authorization Type  HTA    PT Start Time  1346    PT Stop Time  1428    PT Time Calculation (min)  42 min    Equipment Utilized During Treatment  Gait belt    Activity Tolerance  Patient tolerated treatment well    Behavior During Therapy  [facility] for tasks assessed/performed       Past Medical History:  Diagnosis Date  . Cardiac arrest ([place]) 05/22/2016  . CHF (congestive heart failure) ([place])   . Diabetes mellitus without complication ([place])   . Diabetic retinopathy ([place])   . Hyperlipidemia   . Hypertension   . Myocardial infarct ([place]) 2105  . S/P primary angioplasty with coronary stent 05/22/2016  . Vitamin B12 deficiency     Past Surgical History:  Procedure Laterality Date  . ANGIOPLASTY     2015  . REFRACTIVE SURGERY    . VITRECTOMY      There were no vitals filed for this visit.   Subjective Assessment - 09/26/19 1126    Subjective  Pt was in hospital with covid, feels he is very deconditioned. Also notes balance is worsening. Requests a walker, says he was supposed to get one in hospital. He lives with his brother who is helping him with meals, shopping, driving, some ADLS. Has shower chair. States ADLS are taking a very long time bc pt is afraid of falling.    Limitations  Standing;Walking;House hold activities    Patient Stated Goals  Increased strength, mobility, walking    Currently in Pain?  No/denies    Pain Score  0-No pain         [facility] PT Assessment - 09/26/19 0001      Assessment   Medical Diagnosis  Weakness, Gait     Referring Provider (PT)  Dimas Chyle    Prior Therapy  no      Precautions   Precautions  Fall      Balance Screen   Has the patient fallen in the past 6 months  Yes    How many times?  2    Has the patient had a decrease in activity level because of a fear of falling?   Yes    Is the patient reluctant to leave their home because of a fear of falling?   Yes      Prior Function   Level of Independence  Needs assistance with homemaking;Needs assistance with ADLs      Cognition   Overall Cognitive Status  Within Functional Limits for tasks assessed      Posture/Postural Control   Posture Comments  Fwd head, stiff upper body posture       ROM / Strength   AROM / PROM / Strength  AROM;Strength      AROM   Overall AROM Comments  Hips: WFL, Knees: WFL,       Strength   Overall Strength Comments  Hips: 4/5, Knees: 4+/5       Ambulation/Gait   Gait  Comments  Using Quad cane: decreased step height, length, and  slow speed, pt apprehensive.   Education and practice with use of RW: improved speed, improved step length, requ cueing for posture.       Standardized Balance Assessment   Standardized Balance Assessment  Timed Up and Go Test;Berg Balance Test;Dynamic Gait Index      Timed Up and Go Test   TUG Comments  quad cane: 46.75 sec;  RW: 48.66 sec (extra time used to [employer] walker) but walking time quicker.                 Objective measurements completed on examination: See above findings.              PT Education - 09/26/19 1128    Education Details  Education on use of RW, PT POC, Exam findings.    Person(s) Educated  Patient    Methods  Explanation;Demonstration;Tactile cues;Verbal cues    Comprehension  Verbalized understanding;Returned demonstration;Verbal cues required;Tactile cues required       PT Short Term Goals - 09/26/19 1131      PT SHORT TERM GOAL #1   Title  Pt to obtain RW , and demo correct /safe use    Time  2    Period   Weeks    Status  New    Target Date  09/20/19        PT Long Term Goals - 09/26/19 1132      PT LONG TERM GOAL #1   Title  Pt to be independent with final HEP for LE strength and balance    Time  6    Period  Weeks    Status  New    Target Date  10/18/19      PT LONG TERM GOAL #2   Title  Pt to demo ability for safe, efficient ambulation, with LRAD, for up to 500 ft, to improve ability and safety with community navigation    Time  6    Period  Weeks    Status  New    Target Date  10/18/19      PT LONG TERM GOAL #3   Title  Pt to improve score of TUG by at least 5 seconds, to improve efficiency and speed with gait.    Time  6    Period  Weeks    Status  New    Target Date  10/18/19      PT LONG TERM GOAL #4   Title  Pt to demo improved LE strength to 5/5 to improve stability and ability for gait.    Time  6    Period  Weeks    Status  New    Target Date  10/18/19             Plan - 09/26/19 1130    Clinical Impression Statement  Pt presents with primary complaint of decreased strength and mobility, from recent hospitalization. Pt with decreased mechanics and endurance for gait, using quad cane today, with slow speed, decreased balance, step height and step length. Education done for use of RW, pt with improved safety and efficiency, will order RW for pt today. Pt with mild weakness in LEs, and decrased enducrance for standing and functional activity. Pt with decreased efficiency with gait, seen wit TUG. Pt with decreased dynamic balance, further balance testing to be done at next session. HEP for LE strength also will be issued at next visit. Pt  with decreased safety, and independence for IADLs, ADLs, and community activities. Pt to benefit from skilled PT to improve.    Personal Factors and Comorbidities  Comorbidity 1    Comorbidities  CHF, DM, MI,    Examination-Activity Limitations  Bathing;Locomotion Level;Transfers;Squat;Stairs;Stand;Lift     Examination-Participation Restrictions  Meal Prep;Cleaning;Community Activity;Driving;Shop    Stability/Clinical Decision Making  Stable/Uncomplicated    Clinical Decision Making  Low    Rehab Potential  Good    PT Frequency  2x / week    PT Duration  6 weeks    PT Treatment/Interventions  ADLs/Self Care Home Management;Electrical Stimulation;Gait training;DME Instruction;Ultrasound;Traction;Moist Heat;Iontophoresis 42m/ml Dexamethasone;Stair training;Functional mobility training;Therapeutic activities;Therapeutic exercise;Balance training;Neuromuscular re-education;Manual techniques;Patient/family education;Passive range of motion;Dry needling;Taping;Vasopneumatic Device;Spinal Manipulations;Joint Manipulations    Consulted and Agree with Plan of Care  Patient       Patient will benefit from skilled therapeutic intervention in order to improve the following deficits and impairments:  Abnormal gait, Decreased coordination, Decreased range of motion, Difficulty walking, Decreased safety awareness, Decreased endurance, Decreased activity tolerance, Pain, Improper body mechanics, Decreased balance, Decreased mobility, Decreased strength  Visit Diagnosis: Other abnormalities of gait and mobility  Muscle weakness (generalized)     Problem List Patient Active Problem List   Diagnosis Date Noted  . AKI (acute kidney injury) ([place] 08/15/2019  . Unintentional weight loss with loose stools 05/19/2019  . Low vitamin B12 level 03/29/2019  . Heme positive stool 03/14/2019  . Normocytic anemia 03/14/2019  . Chronic combined systolic and diastolic heart failure ([place] 02/08/2019  . Diabetic retinopathy ([place] 09/08/2018  . Physical debility 05/24/2018  . Varicose veins of both lower extremities 03/01/2018  . Fatigue due to exertion 10/14/2017  . GERD (gastroesophageal reflux disease) 08/25/2017  . CAD S/P percutaneous coronary angioplasty 04/14/2017  . Hyperlipidemia associated with type 2  diabetes mellitus ([place] 04/14/2017  . Diabetic peripheral neuropathy associated with type 2 diabetes mellitus ([place] 08/17/2016  . T2DM (type 2 diabetes mellitus) ([place] 05/02/2016  . Hypertension associated with diabetes ([place] 05/02/2016    LLyndee Hensen PT, DPT 11:41 AM  09/26/19    [facility] [address]Phone: [phone]  Fax:  [phone] Name: MPhyllis WhitefieldMRN: [id number] of Birth: 404-14-1957    PHYSICAL THERAPY DISCHARGE SUMMARY  Visits from Start of Care: 1  Plan: Patient agrees to discharge.  Patient goals were not met. Patient is being discharged due to not returning since the last visit.  ?????      Pt did not return to PT. Called pt, week of Feb 8th, states he does not need to come to PT, he bought an exercise bike and obtained walker, and he is going to exercise at home.   LLyndee Hensen PT, DPT 11:42 AM  09/26/19

## 2019-09-30 ENCOUNTER — Ambulatory Visit (INDEPENDENT_AMBULATORY_CARE_PROVIDER_SITE_OTHER): Payer: HMO | Admitting: Family Medicine

## 2019-09-30 ENCOUNTER — Other Ambulatory Visit: Payer: Self-pay

## 2019-09-30 ENCOUNTER — Encounter: Payer: Self-pay | Admitting: Family Medicine

## 2019-09-30 VITALS — BP 126/58 | HR 61 | Temp 96.8°F | Ht 70.0 in | Wt 161.2 lb

## 2019-09-30 DIAGNOSIS — E1159 Type 2 diabetes mellitus with other circulatory complications: Secondary | ICD-10-CM | POA: Diagnosis not present

## 2019-09-30 DIAGNOSIS — I10 Essential (primary) hypertension: Secondary | ICD-10-CM

## 2019-09-30 DIAGNOSIS — E1142 Type 2 diabetes mellitus with diabetic polyneuropathy: Secondary | ICD-10-CM

## 2019-09-30 DIAGNOSIS — Z794 Long term (current) use of insulin: Secondary | ICD-10-CM | POA: Diagnosis not present

## 2019-09-30 DIAGNOSIS — I152 Hypertension secondary to endocrine disorders: Secondary | ICD-10-CM

## 2019-09-30 MED ORDER — VICTOZA 18 MG/3ML ~~LOC~~ SOPN: 1.8000 mg | PEN_INJECTOR | Freq: Every day | SUBCUTANEOUS | 5 refills | Status: DC

## 2019-09-30 MED ORDER — INSULIN LISPRO (1 UNIT DIAL) 100 UNIT/ML (KWIKPEN): PEN_INJECTOR | SUBCUTANEOUS | 11 refills | Status: DC

## 2019-09-30 MED ORDER — VICTOZA 18 MG/3ML ~~LOC~~ SOPN: PEN_INJECTOR | SUBCUTANEOUS | 0 refills | Status: DC

## 2019-09-30 MED ORDER — LANTUS SOLOSTAR 100 UNIT/ML ~~LOC~~ SOPN: 30.0000 [IU] | PEN_INJECTOR | Freq: Every day | SUBCUTANEOUS | 11 refills | Status: DC

## 2019-09-30 MED ORDER — PEN NEEDLES 33G X 4 MM MISC: 1.0000 "application " | Freq: Four times a day (QID) | 99 refills | Status: DC

## 2019-09-30 NOTE — Progress Notes (Signed)
   Jeffrey [name] is a 64 y.o. male who presents today for an office visit.  Assessment/Plan:  Chronic Problems Addressed Today: Hypertension associated with diabetes ([place]) At goal today.  Continue Coreg 3.125 mg twice daily.  We will continue holding losartan and spironolactone for the time being.  He will continue monitoring at home.  T2DM (type 2 diabetes mellitus) ([place]) Had lengthy discussion with patient regarding checking blood sugars and ideal numbers.  He would like to switch to [place] because he does not think it is effective and it is causing some side effects.  We will stop Jardiance today and we will start Victoza today.  Goal is to titrate to 1.8 mg daily over the next few weeks.  He is reluctant to try Ozempic or Trulicity due to association with worsening diabetic retinopathy.  We will also send in postprandial Humalog to use on sliding scale.  He will follow with me in a couple months and we will recheck A1c at that time.    Subjective:  HPI:  Patient is here for follow-up today.  Was last seen about a month ago.  A1c was 9.1.  We started him on Jardiance.  Since our last visit he has done 1 to 20 mg daily.  He is also taking Lantus 30 units in the morning.  Fasting sugars usually in the 90s to low 100s.  Has had postprandial readings into the 200s and 300s.  He tried taking Lantus in the evening but did not tolerate it due to nocturnal hypoglycemia.  He has had quite a bit of frequent urination due to the Jardiance.  He would like to switch to an alternative medication.       Objective:  Physical Exam: BP (!) 126/58   Pulse 61   Temp (!) 96.8 F (36 C)   Ht 5\' 10"  (1.778 m)   Wt 161 lb 4 oz (73.1 kg)   SpO2 96%   BMI 23.14 kg/m   Gen: No acute distress, resting comfortably  Time Spent: 45 minutes of total time was spent on the date of the encounter performing the following actions: chart review prior to seeing the patient, obtaining history, performing a  medically necessary exam, counseling on the treatment plan, placing orders, and documenting in our EHR.        [name]. [name], MD 09/30/2019 11:25 AM

## 2019-09-30 NOTE — Assessment & Plan Note (Signed)
Had lengthy discussion with patient regarding checking blood sugars and ideal numbers.  He would like to switch to [place] because he does not think it is effective and it is causing some side effects.  We will stop Jardiance today and we will start Victoza today.  Goal is to titrate to 1.8 mg daily over the next few weeks.  He is reluctant to try Ozempic or Trulicity due to association with worsening diabetic retinopathy.  We will also send in postprandial Humalog to use on sliding scale.  He will follow with me in a couple months and we will recheck A1c at that time.

## 2019-09-30 NOTE — Patient Instructions (Signed)
It was very nice to see you today!  STOP the Jardiance.   We we will start Victoza.  Please take 0.6 mg daily for the first week.  Then increase to 1.2 mg daily for the next week, then increase to 1.8 mg daily and stay there.  We will send in a Lantus pen for you to use instead of the vials.  Also send in a prescription for Humalog.  Please take 3 times daily after meals.  Take 5 units if your sugar is above 200, 10 units if your sugar is above 300, and 15 units if your sugars above 400.  We will see you back in a few months.  Please come back to see [place] sooner if needed.  Take care, Dr [name]  Please try these tips to maintain a healthy lifestyle:   Eat at least 3 REAL meals and 1-2 snacks per day.  Aim for no more than 5 hours between eating.  If you eat breakfast, please do so within one hour of getting up.    Each meal should contain half fruits/vegetables, one quarter protein, and one quarter carbs (no bigger than a computer mouse)   Cut down on sweet beverages. This includes juice, soda, and sweet tea.     Drink at least 1 glass of water with each meal and aim for at least 8 glasses per day   Exercise at least 150 minutes every week.

## 2019-09-30 NOTE — Assessment & Plan Note (Signed)
At goal today.  Continue Coreg 3.125 mg twice daily.  We will continue holding losartan and spironolactone for the time being.  He will continue monitoring at home.

## 2019-10-01 ENCOUNTER — Encounter: Payer: Self-pay | Admitting: Family Medicine

## 2019-10-03 ENCOUNTER — Encounter: Payer: Self-pay | Admitting: Family Medicine

## 2019-10-03 ENCOUNTER — Other Ambulatory Visit: Payer: Self-pay

## 2019-10-03 MED ORDER — INSULIN ASPART 100 UNIT/ML ~~LOC~~ SOLN: SUBCUTANEOUS | 11 refills | Status: DC

## 2019-10-04 ENCOUNTER — Other Ambulatory Visit: Payer: Self-pay | Admitting: Family Medicine

## 2019-10-09 ENCOUNTER — Encounter: Payer: Self-pay | Admitting: Family Medicine

## 2019-10-10 NOTE — Telephone Encounter (Signed)
Patient scheduled a appt.

## 2019-10-11 ENCOUNTER — Encounter: Payer: Self-pay | Admitting: Family Medicine

## 2019-10-11 ENCOUNTER — Other Ambulatory Visit: Payer: Self-pay

## 2019-10-11 ENCOUNTER — Ambulatory Visit (INDEPENDENT_AMBULATORY_CARE_PROVIDER_SITE_OTHER): Payer: HMO | Admitting: Family Medicine

## 2019-10-11 VITALS — BP 118/60 | HR 88 | Temp 97.0°F | Ht 70.0 in | Wt 162.0 lb

## 2019-10-11 DIAGNOSIS — E1159 Type 2 diabetes mellitus with other circulatory complications: Secondary | ICD-10-CM | POA: Diagnosis not present

## 2019-10-11 DIAGNOSIS — I10 Essential (primary) hypertension: Secondary | ICD-10-CM

## 2019-10-11 DIAGNOSIS — E1142 Type 2 diabetes mellitus with diabetic polyneuropathy: Secondary | ICD-10-CM | POA: Diagnosis not present

## 2019-10-11 DIAGNOSIS — Z794 Long term (current) use of insulin: Secondary | ICD-10-CM

## 2019-10-11 DIAGNOSIS — I152 Hypertension secondary to endocrine disorders: Secondary | ICD-10-CM

## 2019-10-11 MED ORDER — SPIRONOLACTONE 25 MG PO TABS: 25.0000 mg | ORAL_TABLET | Freq: Every day | ORAL | 3 refills | Status: DC

## 2019-10-11 NOTE — Assessment & Plan Note (Signed)
At goal.  We will continue Coreg 3.125 mg twice daily.  We will restart spironolactone 25 mg daily due to lower extremity swelling and history of cardiac illness.  Advised him to keep an eye on blood pressures and let me know he starts to have symptomatic lows.  He will follow with me in a few weeks.

## 2019-10-11 NOTE — Assessment & Plan Note (Signed)
Doing very well with new regimen.  We will continue Victoza with a goal of titrating to 1.8 mg daily over the next couple of weeks.  Continue Lantus 30 units daily and sliding scale Humalog.  He will follow-up in a few weeks and will recheck A1c at that point.

## 2019-10-11 NOTE — Patient Instructions (Signed)
It was very nice to see you today!  Restart the spironolactone.  Keep an eye on your blood pressures and let me know if you have any symptoms or if your blood pressure gets too low.  We will see you back next month.   Take care, Dr [name]  Please try these tips to maintain a healthy lifestyle:   Eat at least 3 REAL meals and 1-2 snacks per day.  Aim for no more than 5 hours between eating.  If you eat breakfast, please do so within one hour of getting up.    Each meal should contain half fruits/vegetables, one quarter protein, and one quarter carbs (no bigger than a computer mouse)   Cut down on sweet beverages. This includes juice, soda, and sweet tea.     Drink at least 1 glass of water with each meal and aim for at least 8 glasses per day   Exercise at least 150 minutes every week.

## 2019-10-11 NOTE — Progress Notes (Signed)
   Jeffrey [name] is a 64 y.o. male who presents today for an office visit.  Assessment/Plan:  New/Acute Problems: Lower extremity edema Likely worsened due to recently stopping his Jardiance and no longer being on diuretics.  No red flags.  Will restart low-dose spironolactone today.  Chronic Problems Addressed Today: Hypertension associated with diabetes ([place]) At goal.  We will continue Coreg 3.125 mg twice daily.  We will restart spironolactone 25 mg daily due to lower extremity swelling and history of cardiac illness.  Advised him to keep an eye on blood pressures and let me know he starts to have symptomatic lows.  He will follow with me in a few weeks.  T2DM (type 2 diabetes mellitus) ([place]) Doing very well with new regimen.  We will continue Victoza with a goal of titrating to 1.8 mg daily over the next couple of weeks.  Continue Lantus 30 units daily and sliding scale Humalog.  He will follow-up in a few weeks and will recheck A1c at that point.    Subjective:  HPI:  Patient last seen 2 weeks ago.  We stopped his Jardiance due to side effects including frequent urination.  We switched him to Victoza and started sliding scale Humalog.  His blood sugars have done very well.  Fasting sugars usually in the 90s has had a few in the 200s and has had to use sliding scale Humalog.  Is tolerating Victoza well without side effects.  Will be increasing to 1.2 mg daily soon.  For last several days has noticed increasing swelling to lower extremities.  Has noticed some purple discoloration as well.  No reported chest pain.  Was hospitalized couple months ago when spironolactone was stopped due to AKI.       Objective:  Physical Exam: BP 118/60   Pulse 88   Temp (!) 97 F (36.1 C)   Ht 5\' 10"  (1.778 m)   Wt 162 lb (73.5 kg)   SpO2 98%   BMI 23.24 kg/m   Wt Readings from Last 3 Encounters:  10/11/19 162 lb (73.5 kg)  09/30/19 161 lb 4 oz (73.1 kg)  09/01/19 156 lb 4 oz (70.9 kg)    Gen: No acute distress, resting comfortably MSK: Bilateral lower extremities with 1+ pitting edema to knees.  Distal pulses intact.      [name]. [name], MD 10/11/2019 1:35 PM

## 2019-10-12 ENCOUNTER — Encounter: Payer: Self-pay | Admitting: Family Medicine

## 2019-10-26 ENCOUNTER — Encounter: Payer: Self-pay | Admitting: Family Medicine

## 2019-10-27 ENCOUNTER — Other Ambulatory Visit: Payer: Self-pay

## 2019-10-27 MED ORDER — METFORMIN HCL ER 500 MG PO TB24: 500.0000 mg | ORAL_TABLET | Freq: Two times a day (BID) | ORAL | 1 refills | Status: DC

## 2019-10-27 NOTE — Telephone Encounter (Signed)
Patient taking 500 MG twice daily Rx sent

## 2019-11-21 ENCOUNTER — Ambulatory Visit: Payer: HMO | Attending: Internal Medicine

## 2019-11-21 DIAGNOSIS — Z23 Encounter for immunization: Secondary | ICD-10-CM

## 2019-11-21 NOTE — Progress Notes (Signed)
   Covid-19 Vaccination Clinic  Name:  Kion Arpino    MRN: [id number] DOB: [birth year]  11/21/2019  Mr. Krol was observed post Covid-19 immunization for 15 minutes without incident. He was provided with Vaccine Information Sheet and instruction to access the V-Safe system.   Mr. Fulmore was instructed to call 911 with any severe reactions post vaccine: [name] Kitchen Difficulty breathing  . Swelling of face and throat  . A fast heartbeat  . A bad rash all over body  . Dizziness and weakness   Immunizations Administered    Name Date Dose VIS Date Route   Pfizer COVID-19 Vaccine 11/21/2019 11:36 AM 0.3 mL 07/22/2019 Intramuscular   Manufacturer: [place]   Lot: [id number]   [place]: [id number]

## 2019-11-25 ENCOUNTER — Other Ambulatory Visit: Payer: Self-pay | Admitting: Family Medicine

## 2019-12-01 ENCOUNTER — Other Ambulatory Visit: Payer: Self-pay

## 2019-12-01 ENCOUNTER — Ambulatory Visit (INDEPENDENT_AMBULATORY_CARE_PROVIDER_SITE_OTHER): Payer: HMO | Admitting: Family Medicine

## 2019-12-01 ENCOUNTER — Encounter: Payer: Self-pay | Admitting: Family Medicine

## 2019-12-01 VITALS — BP 128/70 | HR 67 | Temp 97.0°F | Ht 70.0 in | Wt 164.2 lb

## 2019-12-01 DIAGNOSIS — E1159 Type 2 diabetes mellitus with other circulatory complications: Secondary | ICD-10-CM

## 2019-12-01 DIAGNOSIS — I10 Essential (primary) hypertension: Secondary | ICD-10-CM

## 2019-12-01 DIAGNOSIS — E1142 Type 2 diabetes mellitus with diabetic polyneuropathy: Secondary | ICD-10-CM

## 2019-12-01 DIAGNOSIS — L723 Sebaceous cyst: Secondary | ICD-10-CM

## 2019-12-01 DIAGNOSIS — I152 Hypertension secondary to endocrine disorders: Secondary | ICD-10-CM

## 2019-12-01 DIAGNOSIS — Z794 Long term (current) use of insulin: Secondary | ICD-10-CM

## 2019-12-01 LAB — POCT GLYCOSYLATED HEMOGLOBIN (HGB A1C): Hemoglobin A1C: 8 % — AB (ref 4.0–5.6)

## 2019-12-01 MED ORDER — METFORMIN HCL ER 500 MG PO TB24: 1000.0000 mg | ORAL_TABLET | Freq: Two times a day (BID) | ORAL | 1 refills | Status: DC

## 2019-12-01 NOTE — Patient Instructions (Signed)
It was very nice to see you today!  Please increase your metformin to 1000mg  twice daily.  We took out the cyst in your back today.   Come back in 10-14 days to have the stitch removed.   Take care, Dr [name]  Please try these tips to maintain a healthy lifestyle:   Eat at least 3 REAL meals and 1-2 snacks per day.  Aim for no more than 5 hours between eating.  If you eat breakfast, please do so within one hour of getting up.    Each meal should contain half fruits/vegetables, one quarter protein, and one quarter carbs (no bigger than a computer mouse)   Cut down on sweet beverages. This includes juice, soda, and sweet tea.     Drink at least 1 glass of water with each meal and aim for at least 8 glasses per day   Exercise at least 150 minutes every week.

## 2019-12-01 NOTE — Assessment & Plan Note (Signed)
At goal.  Continue Coreg 3.125 mg twice daily and spironolactone 25 mg daily.  Follow-up with me in 3 months.

## 2019-12-01 NOTE — Assessment & Plan Note (Signed)
A1c improved 8.0.  Will increase Metformin to 1000 mg twice daily.  Continue Lantus 30 units daily and sliding scale Humalog.  Follow-up in 3 months to recheck A1c.

## 2019-12-01 NOTE — Progress Notes (Signed)
   Jeffrey [name] is a 64 y.o. male who presents today for an office visit.  Assessment/Plan:  New/Acute Problems: Inflamed sebaceous cyst Removed today.  Tolerated well.  See below procedure note.  He will come back in 10 to 14 days for suture removal.  Chronic Problems Addressed Today: Hypertension associated with diabetes (Jeffrey [name]) At goal.  Continue Coreg 3.125 mg twice daily and spironolactone 25 mg daily.  Follow-up with me in 3 months.  [name] A1c improved 8.0.  Will increase Metformin to 1000 mg twice daily.  Continue Lantus 30 units daily and sliding scale Humalog.  Follow-up in 3 months to recheck A1c.     Subjective:  HPI:  Patient here for diabetes follow-up.  Unfortunately since our last visit his insurance will no longer cover Victoza.  He was restarted on Metformin 500 mg twice daily and is done well with this.  Is also taking Lantus 30 units daily and NovoLog as needed with meals.  Fasting sugars have been good however no time sugars been on the higher side.  Would also like to have a cyst removed on his back.  Is been there for several years.  Is been increasing in size.  Had it drained a couple times in the past however it keeps recurring.       Objective:  Physical Exam: BP 128/70   Pulse 67   Temp (!) 97 F (36.1 C)   Ht 5\' 10"  (1.778 m)   Wt 164 lb 4 oz (74.5 kg)   SpO2 100%   BMI 23.57 kg/m   Gen: No acute distress, resting comfortably CV: Regular rate and rhythm with no murmurs appreciated Pulm: Normal work of breathing, clear to auscultation bilaterally with no crackles, wheezes, or rhonchi Neuro: Grossly normal, moves all extremities Psych: Normal affect and thought content  Sebaceous Cyst I&D with Excision Procedure Note  Pre-operative Diagnosis: Inflamed sebaceous cyst  Post-operative Diagnosis: Same  Locations: Back  Indications: Diagnostic and therapeutic  Anesthesia: Lidocaine 1% with epinephrine without  added sodium bicarbonate  Procedure Details  History of allergy to iodine: No  Patient informed of the risks (including bleeding and infection) and benefits of the  procedure and Written informed consent obtained.  The lesion and surrounding area was given a sterile prep using betadyne and draped in the usual sterile fashion.  A 4 mm punch biopsy was used to penetrate the lesion.  Typical sebaceous material was then expressed from the incision site.  After successful expression of material, a hemostat was then inserted into the incision to break up any loculations. A hemostat was then used to gently remove the cyst wall from the incision.  A single suture was placed.  Antibiotic ointment and a sterile dressing applied.  The specimen was sent for pathologic examination. The patient tolerated the procedure well.  EBL: 2 ml  Findings: Inflamed sebaceous cyst  Condition: Stable  Complications: none.  Plan: 1. Instructed to keep the wound dry and covered for 24-48h and clean thereafter. 2. Warning signs of infection were reviewed.   3. Recommended that the patient use OTC analgesics as needed for pain.  4. Return for suture removal in 10-14 days       Jeffrey [name] M. [name], MD 12/01/2019 10:35 AM

## 2019-12-02 ENCOUNTER — Ambulatory Visit: Payer: Self-pay

## 2019-12-12 ENCOUNTER — Ambulatory Visit: Payer: HMO | Attending: Internal Medicine

## 2019-12-12 DIAGNOSIS — Z23 Encounter for immunization: Secondary | ICD-10-CM

## 2019-12-12 NOTE — Progress Notes (Signed)
   Covid-19 Vaccination Clinic  Name:  Brittani Peniston    MRN: [id number] DOB: [birth year]  12/12/2019  Mr. Lanzo was observed post Covid-19 immunization for 15 minutes without incident. He was provided with Vaccine Information Sheet and instruction to access the V-Safe system.   Mr. Moskos was instructed to call 911 with any severe reactions post vaccine: [name] Kitchen Difficulty breathing  . Swelling of face and throat  . A fast heartbeat  . A bad rash all over body  . Dizziness and weakness   Immunizations Administered    Name Date Dose VIS Date Route   Pfizer COVID-19 Vaccine 12/12/2019  1:24 PM 0.3 mL 10/05/2018 Intramuscular   Manufacturer: [place]   Lot: [id number]   [place]: [id number]

## 2019-12-19 ENCOUNTER — Encounter: Payer: Self-pay | Admitting: Family Medicine

## 2019-12-19 ENCOUNTER — Ambulatory Visit (INDEPENDENT_AMBULATORY_CARE_PROVIDER_SITE_OTHER): Payer: HMO | Admitting: Family Medicine

## 2019-12-19 ENCOUNTER — Other Ambulatory Visit: Payer: Self-pay

## 2019-12-19 VITALS — BP 129/74 | HR 77 | Temp 97.3°F | Ht 70.0 in | Wt 167.0 lb

## 2019-12-19 DIAGNOSIS — Z794 Long term (current) use of insulin: Secondary | ICD-10-CM

## 2019-12-19 DIAGNOSIS — E1159 Type 2 diabetes mellitus with other circulatory complications: Secondary | ICD-10-CM

## 2019-12-19 DIAGNOSIS — L723 Sebaceous cyst: Secondary | ICD-10-CM | POA: Diagnosis not present

## 2019-12-19 DIAGNOSIS — I10 Essential (primary) hypertension: Secondary | ICD-10-CM | POA: Diagnosis not present

## 2019-12-19 DIAGNOSIS — Z4802 Encounter for removal of sutures: Secondary | ICD-10-CM | POA: Diagnosis not present

## 2019-12-19 DIAGNOSIS — I152 Hypertension secondary to endocrine disorders: Secondary | ICD-10-CM

## 2019-12-19 DIAGNOSIS — E1142 Type 2 diabetes mellitus with diabetic polyneuropathy: Secondary | ICD-10-CM

## 2019-12-19 NOTE — Patient Instructions (Signed)
It was very nice to see you today!  Please take your stitches out today.  You can use a Band-Aid to the area for the next couple of weeks.  No other changes today.  Please let us know if your fasting sugar drops below 100 and we can adjust your dose of Lantus.  I will see you back in a couple months to recheck your blood sugar.  Take care, Dr [name]  Please try these tips to maintain a healthy lifestyle:   Eat at least 3 REAL meals and 1-2 snacks per day.  Aim for no more than 5 hours between eating.  If you eat breakfast, please do so within one hour of getting up.    Each meal should contain half fruits/vegetables, one quarter protein, and one quarter carbs (no bigger than a computer mouse)   Cut down on sweet beverages. This includes juice, soda, and sweet tea.     Drink at least 1 glass of water with each meal and aim for at least 8 glasses per day   Exercise at least 150 minutes every week.

## 2019-12-19 NOTE — Assessment & Plan Note (Signed)
Continue Metformin 1000 mg twice daily and Lantus 30 units daily.  He will let me know if his fasting sugars drop below 100 and we can lower his dose of Lantus.  He will check in with me in a couple months to recheck A1c.

## 2019-12-19 NOTE — Progress Notes (Signed)
   Jeffrey [name] is a 64 y.o. male who presents today for an office visit.  Assessment/Plan:  New/Acute Problems: Sebaceous cyst Suture removed today without difficulty.  Placed a small bandage to the area.  Still has a small amount of drainage.  Suspect this will improve over the next couple of weeks.  Chronic Problems Addressed Today: Hypertension associated with diabetes ([place]) At goal.  Continue Coreg 3.125 mg twice daily and spironolactone 25 mg daily.  [name] Continue Metformin 1000 mg twice daily and Lantus 30 units daily.  He will let me know if his fasting sugars drop below 100 and we can lower his dose of Lantus.  He will check in with me in a couple months to recheck A1c.     Subjective:  HPI:  See A/p.         Objective:  Physical Exam: BP 129/74   Pulse 77   Temp (!) 97.3 F (36.3 C)   Ht 5\' 10"  (1.778 m)   Wt 167 lb (75.8 kg)   SpO2 99%   BMI 23.96 kg/m   Gen: No acute distress, resting comfortably Neuro: Grossly normal, moves all extremities Psych: Normal affect and thought content  Single suture on back from previous sebaceous cyst removal removed without difficulty.      Jeffrey [name]. Jeffrey Pain, MD 12/19/2019 2:19 PM

## 2019-12-19 NOTE — Assessment & Plan Note (Signed)
At goal.  Continue Coreg 3.125 mg twice daily and spironolactone 25 mg daily. 

## 2019-12-26 ENCOUNTER — Encounter: Payer: Self-pay | Admitting: Family Medicine

## 2019-12-27 ENCOUNTER — Ambulatory Visit (INDEPENDENT_AMBULATORY_CARE_PROVIDER_SITE_OTHER): Payer: HMO | Admitting: Family Medicine

## 2019-12-27 ENCOUNTER — Encounter: Payer: Self-pay | Admitting: Family Medicine

## 2019-12-27 ENCOUNTER — Other Ambulatory Visit: Payer: Self-pay

## 2019-12-27 VITALS — BP 120/60 | HR 77 | Temp 97.9°F | Ht 70.0 in | Wt 161.4 lb

## 2019-12-27 DIAGNOSIS — L723 Sebaceous cyst: Secondary | ICD-10-CM | POA: Diagnosis not present

## 2019-12-27 NOTE — Progress Notes (Signed)
   Jeffrey [name] is a 64 y.o. male who presents today for an office visit.  Assessment/Plan:  New/Acute Problems: Inflamed sebaceous cyst See below procedure note.  Tolerated repeat I&D today.  Given recurrence, the cyst was packed with iodoform gauze.  He will return to clinic in the next several days to have this removed.  May need to repack depending on depth of cyst at that time.  He can use OTC analgesics if needed.     Subjective:  HPI:  Patient here for follow-up.  Had cyst removal about 3 weeks ago.  Was initially doing well but yesterday noticed a pressure sensation of the urinalysis.  His brother expressed the area and reportedly had quite a bit of sebaceous material came out.  No pain.  No fevers or chills.  No purulent drainage.       Objective:  Physical Exam: BP 120/60 (BP Location: Left Arm, Patient Position: Sitting, Cuff Size: Normal)   Pulse 77   Temp 97.9 F (36.6 C) (Temporal)   Ht 5\' 10"  (1.778 m)   Wt 161 lb 6.4 oz (73.2 kg)   SpO2 99%   BMI 23.16 kg/m   Gen: No acute distress, resting comfortably Skin: Approximately 1 mm open lesion on mid back.  Sebaceous material able to be expressed freely.  Incision and Drainage Procedure Note  Pre-operative Diagnosis: Inflamed sebaceous cyst  Post-operative Diagnosis: same  Indications: Therapeutic  Anesthesia: 1% lidocaine with epinephrine  Procedure Details  The procedure, risks and complications have been discussed in detail (including, but not limited to airway compromise, infection, bleeding) with the patient, and the patient has signed consent to the procedure.  The skin was sterilely prepped and draped over the affected area in the usual fashion. After adequate local anesthesia, I&D was performed on the inflamed sebaceous cyst.  The small opening in the skin was slightly enlarged with dermal curette.  Copious amount of sebaceous material was expressed.  Fragments of cyst wall were also removed from the  cyst.  Cyst was probed and found to be 1 to 1-1/2 cm deep and approximately 2 cm in diameter.  Cyst was then packed with iodoform gauze.  Purulent drainage: absent The patient was observed until stable.  Findings: Inflamed sebaceous cyst  EBL: 2 cc's  Drains: None  Condition: Tolerated procedure well   Complications: none.       [name]. [name], MD 12/27/2019 4:24 PM

## 2019-12-27 NOTE — Patient Instructions (Signed)
It was very nice to see you today!  We packed your cyst today.  Come back in a week to have it removed and repacked if needed.   Take care, Dr [name]  Please try these tips to maintain a healthy lifestyle:   Eat at least 3 REAL meals and 1-2 snacks per day.  Aim for no more than 5 hours between eating.  If you eat breakfast, please do so within one hour of getting up.    Each meal should contain half fruits/vegetables, one quarter protein, and one quarter carbs (no bigger than a computer mouse)   Cut down on sweet beverages. This includes juice, soda, and sweet tea.     Drink at least 1 glass of water with each meal and aim for at least 8 glasses per day   Exercise at least 150 minutes every week.

## 2019-12-27 NOTE — Telephone Encounter (Signed)
Patient scheduled.

## 2019-12-27 NOTE — Telephone Encounter (Signed)
Ok to schedule for today 

## 2020-01-02 ENCOUNTER — Encounter: Payer: Self-pay | Admitting: Family Medicine

## 2020-01-02 ENCOUNTER — Other Ambulatory Visit: Payer: Self-pay

## 2020-01-02 ENCOUNTER — Ambulatory Visit (INDEPENDENT_AMBULATORY_CARE_PROVIDER_SITE_OTHER): Payer: HMO | Admitting: Family Medicine

## 2020-01-02 VITALS — BP 102/62 | HR 57 | Temp 98.3°F | Ht 70.0 in | Wt 159.0 lb

## 2020-01-02 DIAGNOSIS — Z794 Long term (current) use of insulin: Secondary | ICD-10-CM | POA: Diagnosis not present

## 2020-01-02 DIAGNOSIS — E1142 Type 2 diabetes mellitus with diabetic polyneuropathy: Secondary | ICD-10-CM

## 2020-01-02 DIAGNOSIS — R5381 Other malaise: Secondary | ICD-10-CM

## 2020-01-02 DIAGNOSIS — J329 Chronic sinusitis, unspecified: Secondary | ICD-10-CM

## 2020-01-02 DIAGNOSIS — L723 Sebaceous cyst: Secondary | ICD-10-CM

## 2020-01-02 MED ORDER — AZELASTINE HCL 0.1 % NA SOLN
2.0000 | Freq: Two times a day (BID) | NASAL | 12 refills | Status: DC
Start: 2020-01-02 — End: 2020-01-26

## 2020-01-02 MED ORDER — AZITHROMYCIN 250 MG PO TABS: ORAL_TABLET | ORAL | 0 refills | Status: DC

## 2020-01-02 NOTE — Assessment & Plan Note (Signed)
Doing well with Metformin 1000 mg twice daily and Lantus 30 units daily.  Will recheck A1c in a couple of months.

## 2020-01-02 NOTE — Assessment & Plan Note (Signed)
Secondary to deconditioning.  Will place referral for home health physical therapy.

## 2020-01-02 NOTE — Patient Instructions (Signed)
It was very nice to see you today!  We packed your cyst today.  Please come back in a couple of days to have the packing removed.  I will place a referral for you to have home health physical therapy.  Please start the nasal spray.  Please start the Z-Pak if your symptoms are not improving in the next few days.  Take care, Dr [name]  Please try these tips to maintain a healthy lifestyle:   Eat at least 3 REAL meals and 1-2 snacks per day.  Aim for no more than 5 hours between eating.  If you eat breakfast, please do so within one hour of getting up.    Each meal should contain half fruits/vegetables, one quarter protein, and one quarter carbs (no bigger than a computer mouse)   Cut down on sweet beverages. This includes juice, soda, and sweet tea.     Drink at least 1 glass of water with each meal and aim for at least 8 glasses per day   Exercise at least 150 minutes every week.

## 2020-01-02 NOTE — Progress Notes (Addendum)
   Jeffrey [name] is a 64 y.o. male who presents today for an office visit.  Assessment/Plan:  New/Acute Problems: Inflamed sebaceous cyst Packing removed.  Repacked today with small amount of iodoform gauze.  He tolerated well.  He will come back in a couple of days to have packing removed.  Sinusitis No red flags.  Start Astelin.  We will send in "pocket prescription" for azithromycin with strict instruction not start my symptoms worsen or not improve in the next few days.  Chronic Problems Addressed Today: T2DM (type 2 diabetes mellitus) ([place]) Doing well with Metformin 1000 mg twice daily and Lantus 30 units daily.  Will recheck A1c in a couple of months.  Physical debility Secondary to deconditioning.  Will place referral for home health physical therapy.     Subjective:  HPI:  Patient here for complaints of follow-up.  Was seen about a week ago.  Had packing placed at that time.  They tried removing it at home however was painful.  He has had little bit of clear drainage from the area.  No signs of infection.  He is also had nasal congestion and rhinorrhea for the past couple of days.  No fevers or chills.  Is already received both Covid vaccines.  No known sick contacts.  Tried taking over-the-counter allergy medication with no significant improvement.  Has been having a little bit more dizziness as well.  Is also concerned about loss of physical strength.  Is very difficult for him to ambulate around home.  He has seen physical therapy once in the past several months ago but did not find it to be very helpful.       Objective:  Physical Exam: BP 102/62 (BP Location: Left Arm, Patient Position: Sitting, Cuff Size: Normal)   Pulse (!) 57   Temp 98.3 F (36.8 C) (Temporal)   Ht 5\' 10"  (1.778 m)   Wt 159 lb (72.1 kg)   SpO2 98%   BMI 22.81 kg/m   Wt Readings from Last 3 Encounters:  01/02/20 159 lb (72.1 kg)  12/27/19 161 lb 6.4 oz (73.2 kg)  12/19/19 167 lb (75.8 kg)   Gen: No acute distress, resting comfortably CV: Regular rate and rhythm with no murmurs appreciated Pulm: Normal work of breathing, clear to auscultation bilaterally with no crackles, wheezes, or rhonchi Skin: Healing 1cm sebaceous cyst on posterior back. Neuro: Grossly normal, moves all extremities Psych: Normal affect and thought content  Procedure Note: Verbal consent was obtained.  Packing was removed without difficulty.  Approximately 1 cc of 1% lidocaine without epinephrine was then infiltrated into this as per patient request.  Area was then packed with iodoform gauze.  He tolerated well.  No blood loss.  Time Spent: 43 minutes of total time was spent on the date of the encounter performing the following actions: chart review prior to seeing the patient, obtaining history, performing a medically necessary exam, counseling on the treatment plan, placing orders, and documenting in our EHR. This time is independent of the time spent on performing the above procedure.        [name]. [name], MD 01/02/2020 11:50 AM

## 2020-01-03 ENCOUNTER — Ambulatory Visit: Payer: HMO | Admitting: Family Medicine

## 2020-01-03 DIAGNOSIS — H4312 Vitreous hemorrhage, left eye: Secondary | ICD-10-CM | POA: Diagnosis not present

## 2020-01-03 DIAGNOSIS — E113511 Type 2 diabetes mellitus with proliferative diabetic retinopathy with macular edema, right eye: Secondary | ICD-10-CM | POA: Diagnosis not present

## 2020-01-03 DIAGNOSIS — H43822 Vitreomacular adhesion, left eye: Secondary | ICD-10-CM | POA: Diagnosis not present

## 2020-01-03 DIAGNOSIS — E113592 Type 2 diabetes mellitus with proliferative diabetic retinopathy without macular edema, left eye: Secondary | ICD-10-CM | POA: Diagnosis not present

## 2020-01-03 LAB — HM DIABETES EYE EXAM

## 2020-01-04 ENCOUNTER — Other Ambulatory Visit: Payer: Self-pay

## 2020-01-04 ENCOUNTER — Encounter: Payer: Self-pay | Admitting: Family Medicine

## 2020-01-04 ENCOUNTER — Ambulatory Visit (INDEPENDENT_AMBULATORY_CARE_PROVIDER_SITE_OTHER): Payer: HMO | Admitting: Family Medicine

## 2020-01-04 VITALS — BP 102/60 | HR 65 | Ht 70.0 in

## 2020-01-04 DIAGNOSIS — L309 Dermatitis, unspecified: Secondary | ICD-10-CM | POA: Insufficient documentation

## 2020-01-04 DIAGNOSIS — I10 Essential (primary) hypertension: Secondary | ICD-10-CM

## 2020-01-04 DIAGNOSIS — I152 Hypertension secondary to endocrine disorders: Secondary | ICD-10-CM

## 2020-01-04 DIAGNOSIS — E1159 Type 2 diabetes mellitus with other circulatory complications: Secondary | ICD-10-CM

## 2020-01-04 DIAGNOSIS — E11319 Type 2 diabetes mellitus with unspecified diabetic retinopathy without macular edema: Secondary | ICD-10-CM

## 2020-01-04 DIAGNOSIS — Z794 Long term (current) use of insulin: Secondary | ICD-10-CM

## 2020-01-04 DIAGNOSIS — L723 Sebaceous cyst: Secondary | ICD-10-CM

## 2020-01-04 DIAGNOSIS — E1142 Type 2 diabetes mellitus with diabetic polyneuropathy: Secondary | ICD-10-CM | POA: Diagnosis not present

## 2020-01-04 MED ORDER — TRIAMCINOLONE ACETONIDE 0.1 % EX CREA: 1.0000 "application " | TOPICAL_CREAM | Freq: Two times a day (BID) | CUTANEOUS | 0 refills | Status: AC

## 2020-01-04 NOTE — Progress Notes (Signed)
   Jeffrey [name] is a 65 y.o. male who presents today for an office visit.  Assessment/Plan:  New/Acute Problems: Inflamed sebaceous cyst Packing removed today without difficulty.  No signs of infection.  We will place sterile dressing daily.  Anticipate that will fully heal over the next few weeks.  Discussed reasons to return to care.  Chronic Problems Addressed Today: Hypertension associated with diabetes ([place]) At goal.  Continue Coreg 3.125 mg twice daily and spironolactone 25 mg daily.  Dermatitis Send in triamcinolone.  Diabetic retinopathy ([place]) Will be follow-up with ophthalmology for procedure soon.  T2DM (type 2 diabetes mellitus) ([place]) Due for A1c check in a couple of months.  Continue Metformin 1000 mg twice daily and Lantus 30 daily.     Subjective:  HPI:  Patient here for cyst follow-up.  Had drainage 2 days ago.  Had packing placed.  Has done well since then.  Bandage has remained dry.       Objective:  Physical Exam: BP 102/60   Pulse 65   Ht 5\' 10"  (1.778 m)   SpO2 100%   BMI 22.81 kg/m   Gen: No acute distress, resting comfortably CV: Regular rate and rhythm with no murmurs appreciated Pulm: Normal work of breathing, clear to auscultation bilaterally with no crackles, wheezes, or rhonchi Neuro: Grossly normal, moves all extremities Psych: Normal affect and thought content  [name] was removed from back wound without difficulty.  No discharge present.      [name]. [name], MD 01/04/2020 11:32 AM

## 2020-01-04 NOTE — Assessment & Plan Note (Signed)
Send in triamcinolone.

## 2020-01-04 NOTE — Assessment & Plan Note (Signed)
Will be follow-up with ophthalmology for procedure soon.

## 2020-01-04 NOTE — Assessment & Plan Note (Signed)
Due for A1c check in a couple of months.  Continue Metformin 1000 mg twice daily and Lantus 30 daily.

## 2020-01-04 NOTE — Assessment & Plan Note (Signed)
At goal.  Continue Coreg 3.125 mg twice daily and spironolactone 25 mg daily. 

## 2020-01-04 NOTE — Patient Instructions (Addendum)
It was very nice to see you today!  Please keep your wound bandaged.  Please avoid soaking in water.  Please let me know if it does not heal up over the next couple weeks.  I will see back in July for your next checkup.  Please come back to see me sooner if needed.  Take care, Dr [name]  Please try these tips to maintain a healthy lifestyle:   Eat at least 3 REAL meals and 1-2 snacks per day.  Aim for no more than 5 hours between eating.  If you eat breakfast, please do so within one hour of getting up.    Each meal should contain half fruits/vegetables, one quarter protein, and one quarter carbs (no bigger than a computer mouse)   Cut down on sweet beverages. This includes juice, soda, and sweet tea.     Drink at least 1 glass of water with each meal and aim for at least 8 glasses per day   Exercise at least 150 minutes every week.

## 2020-01-05 ENCOUNTER — Telehealth: Payer: Self-pay

## 2020-01-05 NOTE — Telephone Encounter (Signed)
Primary [place], MD  Chart reviewed as part of pre-operative protocol coverage. Because of Jeffrey [name]'s past medical history and time since last visit, he/she will require a follow-up visit in order to better assess preoperative cardiovascular risk.  Pre-op covering staff: - Please schedule appointment and call patient to inform them. - Please contact requesting surgeon's office via preferred method (i.e, phone, fax) to inform them of need for appointment prior to surgery.  If applicable, this message will also be routed to pharmacy pool and/or primary cardiologist for input on holding anticoagulant/antiplatelet agent as requested below so that this information is available at time of patient's appointment.   Deberah Pelton, NP  01/05/2020, 4:51 PM

## 2020-01-05 NOTE — Telephone Encounter (Signed)
   [facility] Pre-operative Risk Assessment    HEARTCARE STAFF: - Please ensure there is not already an duplicate clearance open for this procedure. - Under Visit Info/Reason for Call, type in Other and utilize the format Clearance MM/DD/YY or Clearance TBD. Do not use dashes or single digits. - If request is for dental extraction, please clarify the # of teeth to be extracted.  Request for surgical clearance:  1. What type of surgery is being performed? VITRECTOMY, ENDOLASER INTRAVITREAL EYLEA W/COMBINED CATARACT SURGERY     2. When is this surgery scheduled? 02-27-2020   3. What type of clearance is required (medical clearance vs. Pharmacy clearance to hold med vs. Both)? PHARMACY  4. Are there any medications that need to be held prior to surgery and how long?ASA 7 DAYS   5. Practice name and name of physician performing surgery? [facility]  [name], MD   6. What is the office phone number?  [phone]   7.   What is the office fax number? [phone]  8.   Anesthesia type (None, local, MAC, general) ? MAC

## 2020-01-06 NOTE — Telephone Encounter (Signed)
Pt is scheduled to see Dr. Oval Linsey 03/07/20, though procedure is set for 02/27/20. Will see if we can get the pt in sooner for pre op clearance.

## 2020-01-06 NOTE — Telephone Encounter (Signed)
Pt has been scheduled to see Sande Rives, [facility] 01/26/20 for pre op clearance. I will forward clearance to [facility] for appt. I will remove from the pre op call back pool.

## 2020-01-13 ENCOUNTER — Telehealth: Payer: Self-pay | Admitting: Family Medicine

## 2020-01-13 NOTE — Telephone Encounter (Signed)
FYI

## 2020-01-13 NOTE — Telephone Encounter (Signed)
Cindy from [facility] called and wanted to let Dr. [name] know that the patient declined the referral.

## 2020-01-19 NOTE — Progress Notes (Addendum)
Cardiology Office Note:    Date:  01/26/2020   ID:  Jeffrey [name], DOB 01/14/1956, MRN [id number]  PCP:  Parker, Caleb M, MD  Cardiologist:  Tiffany [place], MD  Electrophysiologist:  None   Referring MD: Parker, Caleb M, MD   Chief Complaint: pre-op evaluation  History of Present Illness:    Jeffrey [name] is a 64 y.o. male with a history of CAD s/p cardiac arrest and PCI to LAD in 11/2013, hypertension, hyperlipidemia, and diabetes mellitus with peripheral neuropathy who is followed by Dr. [place] and presents today for pre-op evaluation.  [name] presented with a cardiac arrest in 11/2013 and underwent PCI of the LAD at that time. At the end of 2016, he had an episode of stabbing chest pain. 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 His Plavix was discontinued and he underwent several laser procedures. He had a PET CT/myocardial perfusion scan in 10/2015 that revealed a moderate sized, moderate intensity antero-apical infarct with mild ischemia. Myoview in 09/2017 showed prior apical infarct but no evidence of ischemia. Echo in 09/2017 showed LVEF of 45-50% with mild hypokinesis of the apical myocardium and grade 1 diastolic dysfunction.  He has struggled with syncope in the past and his antihypertensive were reduced at prior appointments. He was seen in the ED in 05/2019 after a syncopal episode that occurred in the setting of intravascular volume depletion. He was seen by Dr. Graham in 06/2019 for follow-up and denied any recurrent syncopal episodes. BP was stable and dizziness had improved. Repeat Echo was ordered to reassess LVEF. Echo showed LVEF of 50-55% with inferior basal and distal septal hypokinesis. Normal GLS of -18. EF slightly improved from prior Echo and otherwise stable.   Since his last visit, she was admitted in 08/2019 with acute renal failure on CKD stage  III with creatinine of 2.08 (baseline around 1.4) in setting of poor PO intake after testing positive for COVID-19. He was treated with IV fluids and Remdesivir. Creatinine was 1.78 on discharge. Home Lasix, Spironolactone, and Losartan were held and patient was instructed to follow-up with PCP to see if these needed to be restarted.  Patient presents today for pre-op evaluation for upcoming eye procedure. Here alone. Patient is has done well from a cardiac standpoint since his last visit. He denies any chest pain. He has some chronic shortness of breath with relatively mild activities, such as showering, but this is not new for him. Suspect this is due to deconditioning. He notes chronic lower extremity edema which is stable on Lasix and Spironolactone. No orthopnea or PND. No palpitations, lightheadedness, dizziness, or syncope. Patient is deconditioned and is not very active. Per Duke Activity Status Index, he can complete 3.97 METS. He can complete all activities of daily living and walk inside. He ambulates with a cane due to some balance issues. He thinks he could probably go up a flight of steps but cannot remember the last time he has done this. PCP has referred him to PT but he would like to wait to start this until after eye procedure. He is also limited in how much he can lift due to history of vitrial hemorrhage.  Past Medical History:  Diagnosis Date  . Cardiac arrest ([name]) 05/22/2016  . CHF (congestive heart failure) ([name])   . Diabetes mellitus without complication ([name])   . Diabetic retinopathy ([name])   .   Hyperlipidemia   . Hypertension   . Myocardial infarct ([name]) 2105  . S/P primary angioplasty with coronary stent 05/22/2016  . Vitamin B12 deficiency     Past Surgical History:  Procedure Laterality Date  . ANGIOPLASTY     2015  . REFRACTIVE SURGERY    . VITRECTOMY      Current Medications: Current Meds  Medication Sig  . aspirin EC 81 MG tablet Take 81 mg by mouth daily.    . atorvastatin (LIPITOR) 80 MG tablet TAKE 1 TABLET BY MOUTH EVERY DAY  . Blood Glucose Monitoring Suppl (ONETOUCH VERIO IQ SYSTEM) w/Device KIT 1 kit by Does not apply route 4 (four) times daily.  . carvedilol (COREG) 3.125 MG tablet TAKE 1 TABLET BY MOUTH 2 TIMES DAILY WITH MEAL  . furosemide (LASIX) 40 MG tablet TAKE 1 TABLET BY MOUTH EVERY DAY  . glucose blood test strip Check blood sugar 4 times per day  . insulin aspart (NOVOLOG) 100 UNIT/ML injection Use 5-15 units three times daily as needed for elevated blood sugars. 5 Units for sugar >200, 10 units for sugar >300, 15 units for sugar 400+  . Insulin Glargine (LANTUS SOLOSTAR) 100 UNIT/ML Solostar Pen Inject 30 Units into the skin daily.  . Insulin Pen Needle (PEN NEEDLES) 33G X 4 MM MISC 1 application by Does not apply route in the morning, at noon, in the evening, and at bedtime.  . Insulin Syringe-Needle U-100 (B-D INS SYR HALF-UNIT .3CC/31G) 31G X 5/16" 0.3 ML MISC Use 6 times a day  . metFORMIN (GLUCOPHAGE-XR) 500 MG 24 hr tablet Take 2 tablets (1,000 mg total) by mouth 2 (two) times daily.  . OneTouch Delica Lancets 33G MISC Use to check blood sugar 4 times per day  . spironolactone (ALDACTONE) 25 MG tablet Take 1 tablet (25 mg total) by mouth daily.  . triamcinolone cream (KENALOG) 0.1 % Apply 1 application topically 2 (two) times daily.  . [DISCONTINUED] azelastine (ASTELIN) 0.1 % nasal spray Place 2 sprays into both nostrils 2 (two) times daily.  . [DISCONTINUED] azithromycin (ZITHROMAX) 250 MG tablet Take 2 tabs day 1, then 1 tab daily  . [DISCONTINUED] cholecalciferol (VITAMIN D3) 25 MCG (1000 UT) tablet Take 1,000 Units by mouth daily.  . [DISCONTINUED] vitamin B-12 100 MCG tablet Take 1 tablet (100 mcg total) by mouth daily. (Patient taking differently: Take 1,000 mcg by mouth daily. )     Allergies:   Codeine and Penicillins   Social History   Socioeconomic History  . Marital status: Single    Spouse name: Not on file   . Number of children: Not on file  . Years of education: Not on file  . Highest education level: Not on file  Occupational History  . Not on file  Tobacco Use  . Smoking status: Never Smoker  . Smokeless tobacco: Never Used  Vaping Use  . Vaping Use: Never used  Substance and Sexual Activity  . Alcohol use: Not Currently    Comment: occ  . Drug use: No  . Sexual activity: Never  Other Topics Concern  . Not on file  Social History Narrative  . Not on file   Social Determinants of Health   Financial Resource Strain:   . Difficulty of Paying Living Expenses:   Food Insecurity: No Food Insecurity  . Worried About Running Out of Food in the Last Year: Never true  . Ran Out of Food in the Last Year: Never true  Transportation Needs:   No Transportation Needs  . Lack of Transportation (Medical): No  . Lack of Transportation (Non-Medical): No  Physical Activity:   . Days of Exercise per Week:   . Minutes of Exercise per Session:   Stress:   . Feeling of Stress :   Social Connections:   . Frequency of Communication with Friends and Family:   . Frequency of Social Gatherings with Friends and Family:   . Attends Religious Services:   . Active Member of Clubs or Organizations:   . Attends Club or Organization Meetings:   . Marital Status:      Family History: The patient's family history includes Alzheimer's disease in his mother; Heart disease in his brother and father; Peripheral Artery Disease in his father. There is no history of Colon polyps, Prostate cancer, Rectal cancer, or Esophageal cancer.  ROS:   Please see the history of present illness.     EKGs/Labs/Other Studies Reviewed:    The following studies were reviewed today:  Lexiscan Myoview 10/02/2017:  The left ventricular ejection fraction is mildly decreased (45-54%).  Nuclear stress EF: 48%.  There was no ST segment deviation noted during stress.  Defect 1: There is a small defect of severe severity  present in the apical inferior, apical lateral and apex location.  This is a low risk study.   Abnormal, low risk stress nuclear study with prior apical infarct; no ischemia; EF 48 with mild global hypokinesis; mild LVE. _______________  Echocardiogram 07/22/2019: Impressions: 1. Left ventricular ejection fraction, by visual estimation, is 50 to  55%. The left ventricle has low normal function. Left ventricular septal  wall thickness was mildly increased. There is no left ventricular  hypertrophy.  2. The left ventricle demonstrates regional wall motion abnormalities.  3. Normal GLS -18 Inferior basal and distal septal hypokinesis.  4. Global right ventricle has normal systolic function.The right  ventricular size is normal. No increase in right ventricular wall  thickness.  5. Left atrial size was mildly dilated.  6. Right atrial size was normal.  7. The mitral valve is normal in structure. Trivial mitral valve  regurgitation. No evidence of mitral stenosis.  8. The tricuspid valve is normal in structure. Tricuspid valve  regurgitation is not demonstrated.  9. The aortic valve is tricuspid. Aortic valve regurgitation is mild.  Mild to moderate aortic valve sclerosis/calcification without any evidence  of aortic stenosis.  10. The pulmonic valve was normal in structure. Pulmonic valve  regurgitation is not visualized.  11. Aortic dilatation noted.  12. There is mild dilatation of the aortic root measuring 39 mm.  13. The inferior vena cava is normal in size with greater than 50%  respiratory variability, suggesting right atrial pressure of 3 mmHg.   In comparison to the previous echocardiogram(s): 09/28/17 EF 45-50%.  EKG:  EKG ordered today. EKG personally reviewed and demonstrates normal sinus rhythm, rate 75 bpm, with left axis deviation and low voltage QRS but no acute ST/T changes.  Recent Labs: 08/14/2019: B Natriuretic Peptide 70.8 08/15/2019: TSH 1.943 08/17/2019:  Magnesium 2.0 09/01/2019: ALT 40; BUN 44; Creatinine, Ser 1.67; Hemoglobin 11.0; Platelets 518.0; Potassium 5.0; Sodium 132  Recent Lipid Panel    Component Value Date/Time   CHOL 116 01/06/2019 0819   TRIG 103 08/14/2019 2044   HDL 30 (L) 01/06/2019 0819   CHOLHDL 3.9 01/06/2019 0819   CHOLHDL 4 05/24/2018 0923   VLDL 34.8 05/24/2018 0923   LDLCALC 58 01/06/2019 0819    Physical Exam:      Vital Signs: BP (!) 118/56   Pulse 68   Ht 5' 8" (1.727 m)   Wt 160 lb 6.4 oz (72.8 kg)   SpO2 100%   BMI 24.39 kg/m     Wt Readings from Last 3 Encounters:  01/26/20 160 lb 6.4 oz (72.8 kg)  01/02/20 159 lb (72.1 kg)  12/27/19 161 lb 6.4 oz (73.2 kg)     General: 64 y.o. male in no acute distress. HEENT: Normocephalic and atraumatic. Sclera clear. EOMs intact. Neck: Supple. No carotid bruits. No JVD. Heart: RRR. Distinct S1 and S2. No murmurs, gallops, or rubs. Radial pulses 2+ and equal bilaterally. Lungs: No increased work of breathing. Clear to ausculation bilaterally. No wheezes, rhonchi, or rales.  Abdomen: Soft, non-distended, and non-tender to palpation.  MSK: Ambulates with a cane. Extremities: Trace ankle edema bilaterally. Skin: Warm and dry. Neuro: Alert and oriented x3. No focal deficits. Psych: Normal affect. Responds appropriately.   Assessment:    1. Pre-op evaluation   2. Coronary artery disease involving native coronary artery of native heart without angina pectoris   3. Ischemic cardiomyopathy   4. Syncope, unspecified syncope type   5. Hypertension, unspecified type   6. Hyperlipidemia, unspecified hyperlipidemia type   7. Type 2 diabetes mellitus with complication, with long-term current use of insulin ([place])   8. Stage 3 chronic kidney disease, unspecified whether stage 3a or 3b CKD     Plan:    Pre-Op Evaluation - Patient has eye procedure planned in July.  - No acute cardiac symptoms. He has chronic shortness of breath with mild activity but this is  unchanged.  - Per Duke Activity Status Index, he can complete 3.97 METS. However, eye procedures are low risk procedures and he has had similar procedures multiple times in the past and tolerated it well. Therefore, patient would be at acceptable risk for procedure without any additional cardiovascular work-up. - For same eye procedure in 01/2019, Dr. Oval Linsey stated patient could hold Aspirin up to 5 days prior to procedure. Recommend the same for this procedure. I will route this note to requesting surgeon's office via [place] fax function.  CAD - S/p cardiac arrest and PCI to LAD in 11/2013. - Stable. No angina.  - Continue aspirin, beta-blocker, and high-intensity statin.    Ischemic Cardiomyopathy - Most recent Echo in 07/2019 showed LVEF of 50-55% with inferior basal and distal septal hypokinesis. Normal GLS of -18.  - Appears euovlemic.  - Continue Lasix 44m daily. - Continue Coreg 3.1232mtwice daily and Spironolactone 2546maily.  - Previously on Losartan but this was stopped during hospitalization in 08/2019 due to renal function. He has visit with Dr. RanOval Linseyxt month. Advised him to come fasting to that visit so we can check some labs (did not want him to have to have 2 blood draws). If creatinine stable and BP allows, could consider adding back Losartan.  Syncope - History of syncope in the past. No recurrent syncope.  Hypertension - BP well controlled. - Continue Coreg 3.125m36mice daily and Spironolactone 25mg28mly.   Hyperlipidemia - Most recent lipid panel from 12/2018: Total Cholesterol 116, Triglycerides 103, HDL 30, LDL 58. - At LDL goal of <70 given CAD.  - Continue Lipitor 80mg 71my.  - Patient not fasting today. However, he has routine follow-up with Dr. RandolOval Linseymonth. Encouraged him to come fasting for that visit and we could check labs then.  Type 2 Diabetes Mellitus - Hemoglobin A1c 8.0% in  11/2019.  - On Metformin and Insulin at home. - Followed by  PCP.  CKD Stage III - Creatinine 1.67 in 08/2019.   Disposition: Patient has routine follow-up with Dr. Oval Linsey scheduled for 03/07/2020. OK to keep this appointment.   Medication Adjustments/Labs and Tests Ordered: Current medicines are reviewed at length with the patient today.  Concerns regarding medicines are outlined above.  Orders Placed This Encounter  Procedures  . EKG 12-Lead   No orders of the defined types were placed in this encounter.   Patient Instructions  Medication Instructions:  The current medical regimen is effective;  continue present plan and medications as directed. Please refer to the Current Medication list given to you today. *If you need a refill on your cardiac medications before your next appointment, please call your pharmacy*  Special Instructions CLEARED FOR UPCOMING SURGERY-HOLD ASPIRIN 5 DAYS PRIOR AND RESTART ASAP AFTER ACCORDING TO SURGEON   Follow-Up: Your next appointment:   [place] [facility] Please call our office 2 months in advance to schedule this appointment In Person with Skeet Latch, MD  At [facility], you and your health needs are our priority.  As part of our continuing mission to provide you with exceptional heart care, we have created designated Provider Care Teams.  These Care Teams include your primary Cardiologist (physician) and Advanced Practice Providers (APPs -  Physician Assistants and Nurse Practitioners) who all work together to provide you with the care you need, when you need it.     Signed, Darreld Mclean, PA-C  01/26/2020 9:01 AM    [place]

## 2020-01-26 ENCOUNTER — Encounter: Payer: Self-pay | Admitting: Student

## 2020-01-26 ENCOUNTER — Ambulatory Visit (INDEPENDENT_AMBULATORY_CARE_PROVIDER_SITE_OTHER): Payer: HMO | Admitting: Student

## 2020-01-26 ENCOUNTER — Other Ambulatory Visit: Payer: Self-pay

## 2020-01-26 VITALS — BP 118/56 | HR 68 | Ht 68.0 in | Wt 160.4 lb

## 2020-01-26 DIAGNOSIS — Z794 Long term (current) use of insulin: Secondary | ICD-10-CM

## 2020-01-26 DIAGNOSIS — R55 Syncope and collapse: Secondary | ICD-10-CM

## 2020-01-26 DIAGNOSIS — E118 Type 2 diabetes mellitus with unspecified complications: Secondary | ICD-10-CM | POA: Diagnosis not present

## 2020-01-26 DIAGNOSIS — Z01818 Encounter for other preprocedural examination: Secondary | ICD-10-CM | POA: Diagnosis not present

## 2020-01-26 DIAGNOSIS — I10 Essential (primary) hypertension: Secondary | ICD-10-CM | POA: Diagnosis not present

## 2020-01-26 DIAGNOSIS — I2510 Atherosclerotic heart disease of native coronary artery without angina pectoris: Secondary | ICD-10-CM

## 2020-01-26 DIAGNOSIS — E785 Hyperlipidemia, unspecified: Secondary | ICD-10-CM | POA: Diagnosis not present

## 2020-01-26 DIAGNOSIS — N183 Chronic kidney disease, stage 3 unspecified: Secondary | ICD-10-CM | POA: Diagnosis not present

## 2020-01-26 DIAGNOSIS — I255 Ischemic cardiomyopathy: Secondary | ICD-10-CM

## 2020-01-26 NOTE — Patient Instructions (Signed)
Medication Instructions:  The current medical regimen is effective;  continue present plan and medications as directed. Please refer to the Current Medication list given to you today. *If you need a refill on your cardiac medications before your next appointment, please call your pharmacy*  Special Instructions CLEARED FOR UPCOMING SURGERY-HOLD ASPIRIN 5 DAYS PRIOR AND RESTART ASAP AFTER ACCORDING TO SURGEON   Follow-Up: Your next appointment:   [place] [facility] Please call our office 2 months in advance to schedule this appointment In Person with Skeet Latch, MD  At [facility], you and your health needs are our priority.  As part of our continuing mission to provide you with exceptional heart care, we have created designated Provider Care Teams.  These Care Teams include your primary Cardiologist (physician) and Advanced Practice Providers (APPs -  Physician Assistants and Nurse Practitioners) who all work together to provide you with the care you need, when you need it.

## 2020-02-03 ENCOUNTER — Other Ambulatory Visit: Payer: Self-pay

## 2020-02-03 DIAGNOSIS — H18413 Arcus senilis, bilateral: Secondary | ICD-10-CM | POA: Diagnosis not present

## 2020-02-03 DIAGNOSIS — Z794 Long term (current) use of insulin: Secondary | ICD-10-CM | POA: Diagnosis not present

## 2020-02-03 DIAGNOSIS — H25043 Posterior subcapsular polar age-related cataract, bilateral: Secondary | ICD-10-CM | POA: Diagnosis not present

## 2020-02-03 DIAGNOSIS — H25013 Cortical age-related cataract, bilateral: Secondary | ICD-10-CM | POA: Diagnosis not present

## 2020-02-03 DIAGNOSIS — H2512 Age-related nuclear cataract, left eye: Secondary | ICD-10-CM | POA: Diagnosis not present

## 2020-02-03 DIAGNOSIS — E113553 Type 2 diabetes mellitus with stable proliferative diabetic retinopathy, bilateral: Secondary | ICD-10-CM | POA: Diagnosis not present

## 2020-02-03 DIAGNOSIS — H2513 Age-related nuclear cataract, bilateral: Secondary | ICD-10-CM | POA: Diagnosis not present

## 2020-02-03 LAB — HM DIABETES EYE EXAM

## 2020-02-03 NOTE — Patient Outreach (Signed)
  [facility]) Care Management Chronic Special Needs Program    02/03/2020  Name: Jaelynn Currier, DOB: 29-Jun-1956  MRN: [id number]   Mr. Corin Tilly is enrolled in a chronic special needs plan for Diabetes. Telephone call to client for CSNP follow up assessment.  Unable to reach. HIPAA compliant voice message left with call back phone number and return call request.   PLAN; RNCM will attempt 2nd telephone call to client in 1 week.   Quinn Plowman RN,BSN,CCM [facility] Care Management [phone]

## 2020-02-06 ENCOUNTER — Other Ambulatory Visit: Payer: Self-pay

## 2020-02-06 ENCOUNTER — Encounter: Payer: Self-pay | Admitting: Family Medicine

## 2020-02-06 NOTE — Patient Outreach (Signed)
[facility]) Care Management Chronic Special Needs Program  02/06/2020  Name: Jeffrey [name] DOB: August 25, 1955  MRN: [id number]  [name] is enrolled in a chronic special needs plan for Diabetes.  Telephone call to client for CSNP assessment follow up. HIPAA verified.  Client's brother, Jeffrey [name] designated party release on the phone with client. Client states he feels his blood pressure is in good control. He reports having a recent follow up visit with his primary care provider.  He states his the doctor made adjustments with his diabetes medications. He report being able to afford his medications and states he takes them as prescribed. Client reports checking his blood sugars at least 2-3 times per day. Client decline offer of free style libre. He states he does not want a free style Elenor Legato he would rather check his blood sugars with a regular glucometer.  Reviewed and updated care plan.    Goals Addressed              This Visit's Progress   .  Client understands the importance of follow-up with providers by attending scheduled visits   On track     Primary care provider visit 01/04/20. Client reports he is scheduled for a follow up appointment with his primary care provider in July 2021. Cardiology visit 01/26/20  Continue to follow up with your providers as recommended.     .  Client verbalize knowledge of Heart Failure disease self management skills within 6 months   On track     Review your [employer] calendar for Heart failure action plan information. RN case manager will send client education article: Heart failure: When to call your doctor or 911. Continue to weigh daily and record weights.  Continue to take your medications as prescribed.     .  Client verbalizes knowledge of Heart Attack self management skills within 6 months   On track     Continue to follow a low salt heart healthy diet.  Follow up with your cardiologist as  recommended.  Notify provider for symptoms of chest pain, sweating, nausea/ vomiting, irregular heartbeat, palpitations, rapid heart rated, shortness of breath, dizziness or fainting. Call 911 for severe symptoms.  Continue to take your medications as prescribed.     .  Client will report no worsening of symptoms related to heart disease within the next 6 months   On track     Notify provider for symptoms of chest pain, sweating, nausea/vomiting, irregular heartbeat, palpitations, rapid heart rate, shortness of breath or dizziness or fainting. Take medications as prescribed. Follow up with your cardiologist as recommended.     .  Client will verbalize blood sugars within goal range of 80 - 130 fasting within 6 months.   On track     Client reports his doctor recently made adjustments to his diabetic medication. He states he is currently taking metformin and lantas.  Continue to take your medication as prescribed.  Contact your doctor for blood sugars less than 70 Continue to check your blood sugars as your doctor recommends. Contact your RN case manager if you have difficulty obtaining your medications.       .  COMPLETED: Client will verbalize communication with pcp regarding acute renal failure diagnosis        Client had follow up visit with provider on 10/11/19     .  Client will verbalize hypoglycemia management within 6 months.        Contact  your doctor if you experience frequent low blood sugars.   Rule of 15 Please follow the Rule of 15 for the treatment of hypoglycemia  (Blood sugar less than 70 mg/ dl or when you feel low blood sugar symptoms) STEP  1:  Take 15 grams of carbohydrates when your blood sugar is low, which include:    3-4 glucose tabs or 3-4 oz of juice or regular soda or  One tube of glucose gel ( can be purchased at your pharmacy) STEP 2: Recheck blood sugar in 15 minutes STEP 3: If your blood sugar is still low at 15 minutes recheck then go back to STEP 1 and  treat again with another 15 grams of carbohydrates.      .  Client will verbalize knowledge of self management of Hypertension as evidences by BP reading of 140/90 or less; or as defined by provider   On track     Continue to monitor and record your blood pressure at home as recommended by your doctor. Client blood pressure reading at 01/27/20 doctor appointment was 118/56 Continue to take your medication as prescribed.  Contact your doctor if you have questions/ concerns     .  Decrease inpatient admissions/ readmissions with in the next year   On track     Client reports no hospital readmission.     .  COMPLETED: General - Client will not be readmitted within 30 days (C-SNP)        Client reports no hospital readmissions.     [name] Kitchen  HEMOGLOBIN A1C < 7        Your last documented Hgb A1c is 8.0 completed on 12/01/19 Check blood sugars daily before eating with a gola of 80 -130. You can also check 1 1/2 hour after eating with a goal of 180 or less.  Continue to eat low carbohydrate and low salt meals, watch portion sizes and avoid sugar sweetened drinks.  Review [employer] calendar sent in the mail for Diabetes action plan.     .  Maintain timely refills of diabetic medication as prescribed within the year .   On track     Client reports he is maintaining timely refills of his medications Client reports he is able to afford his medications.  Continue to take your medications as prescribed Contact your doctor if you have questions regarding your medications.  Contact your RN case manager if you have difficulty obtaining your medications.     .  Obtain annual  Lipid Profile, LDL-C   On track     Lipid profile completed 01/06/20 The goal for LDL is less than 70 mg/ dl as you are at high risk for complications try to avoid saturated fats, trans-fats, and eat more fiber. (Make sure you drink plenty of water when increasing your fiber intake, 8 glasses a day if you are not on a fluid  restriction)     .  Obtain Annual Eye (retinal)  Exam    On track     Your last documented eye exam was 01/05/19 Diabetes can affect your vision.  Plan to have a dilated eye exam every year.     .  Obtain Annual Foot Exam   Not on track     Client reports his doctor checks his feet regularly.  Last noted foot exam was 07/14/17 Your doctor should check your bare feet at each visit. It is important that your doctor check your feet regularly. Discuss this with your  doctor at your next visit.  Diabetes foot care - Check feet daily at home (look for skin color changes, cuts, sores or cracks in the skin, swelling of feet or ankles, ingrown or fungal toenails, corn or calluses). Report these findings to your doctor - Wash feet with soap and water, dry feet well especially between toes - Moisturize your feet but not between the toes - Always wear shoes that protect your whole feet.       .  Obtain annual screen for micro albuminuria (urine) , nephropathy (kidney problems)   Not on track     Last noted microalbuminuria 05/29/16 Diabetes can affect your kidneys.  It is important for your doctor to check your urine at least once a year. These tests show how your kidneys are working.  Discuss this with your doctor at your next visit.     Illa Level Hemoglobin A1C at least 2 times per year   On track     Hgb A1c completed 09/01/19 and 4/22/ 21 Continue to follow up with your doctor  for your annual wellness visit, follow up visits and recommended lab work.     .  Patient stated goal: Client to start physical therapy within 3 weeks (pt-stated)      .  Visit Primary Care Provider or Endocrinologist at least 2 times per year         Primary care provider visit 01/04/20. Client reports he is scheduled for a follow up appointment with his primary care provider in July 2021. Cardiology visit 01/26/20  Continue to follow up with your providers as recommended.        Plan:  Send successful outreach letter  with a copy of their individualized care plan, Send individual care plan to provider and Send educational material  Chronic care management coordinator will outreach in:  6 Months    Quinn Plowman RN,BSN,CCM [facility] [phone]    .

## 2020-02-16 ENCOUNTER — Encounter: Payer: Self-pay | Admitting: Family Medicine

## 2020-02-16 NOTE — Progress Notes (Signed)
This encounter was created in error - please disregard.

## 2020-02-17 ENCOUNTER — Other Ambulatory Visit: Payer: Self-pay

## 2020-02-17 ENCOUNTER — Ambulatory Visit (INDEPENDENT_AMBULATORY_CARE_PROVIDER_SITE_OTHER): Payer: HMO | Admitting: Family Medicine

## 2020-02-17 ENCOUNTER — Encounter: Payer: Self-pay | Admitting: Family Medicine

## 2020-02-17 VITALS — BP 112/60 | HR 84 | Temp 98.2°F | Ht 68.0 in | Wt 160.2 lb

## 2020-02-17 DIAGNOSIS — Z794 Long term (current) use of insulin: Secondary | ICD-10-CM

## 2020-02-17 DIAGNOSIS — E1142 Type 2 diabetes mellitus with diabetic polyneuropathy: Secondary | ICD-10-CM | POA: Diagnosis not present

## 2020-02-17 NOTE — Assessment & Plan Note (Signed)
Patient doing well on Metformin 1000 mg twice daily and Lantus 30 was daily.  He will come back in a few weeks for A1c recheck.

## 2020-02-17 NOTE — Progress Notes (Signed)
   Jeffrey [name] is a 64 y.o. male who presents today for an office visit.  Assessment/Plan:  New/Acute Problems: Sebaceous cyst Wound appears to be healing normally.  Still has a bit of a residual opening but is much improved compared to last time we checked.  Hopefully will continue to close.  Diabetes may be slowing wound healing.  Chronic Problems Addressed Today: T2DM (type 2 diabetes mellitus) ([name]) Patient doing well on Metformin 1000 mg twice daily and Lantus 30 was daily.  He will come back in a few weeks for A1c recheck.  Diabetic peripheral neuropathy associated with type 2 diabetes mellitus ([name]) Will place referral to orthopedics per patient request.  May have Charcot foot -he would like to defer x-rays until he sees orthopedics.     Subjective:  HPI:  Patient here.  Requests referral to orthopedics.  Has a history of peripheral neuropathy secondary to diabetes.  Has had some difficulty finding good footwear and finding balance.  Does not have any sensation in his feet.  He would like to have referral to orthopedics to discuss possible brace or footwear.  He has had x-rays in the past and was diagnosed with abnormal bones.  Blood sugar seems to be doing well.  Sebaceous cyst on back was removed couple months ago.  Still has a small hole to the area.  Some hardness as well.  No drainage.  No pain.  No fevers.  No chills.       Objective:  Physical Exam: BP 112/60   Pulse 84   Temp 98.2 F (36.8 C) (Temporal)   Ht 5\' 8"  (1.727 m)   Wt 160 lb 3.2 oz (72.7 kg)   SpO2 97%   BMI 24.36 kg/m   Gen: No acute distress, resting comfortably CV: Regular rate and rhythm with no murmurs appreciated Pulm: Normal work of breathing, clear to auscultation bilaterally with no crackles, wheezes, or rhonchi Skin: Approximately 2 mm circular opening on posterior back.  Tracks about 5 mm deep.  No purulent drainage.  No surrounding erythema. Neuro: Grossly normal, moves all  extremities Psych: Normal affect and thought content      Jeffrey [name] M. [name], MD 02/17/2020 1:25 PM

## 2020-02-17 NOTE — Assessment & Plan Note (Addendum)
Will place referral to orthopedics per patient request.  May have Charcot foot -he would like to defer x-rays until he sees orthopedics.

## 2020-02-17 NOTE — Patient Instructions (Addendum)
It was very nice to see you today!  I will place a referral to the orthopedist for you.  A hole in your back has closed up quite a bit since our last visit.  I think your diabetes is making it heal slower than anticipated but it should continue to improve.  Do not worry about having any infection in the area.  It is okay for you to bathe normally.  We will see you back in a couple of weeks.  It is okay for you to push back your appointment on the 20th a few weeks.  Take care, Dr [name]  Please try these tips to maintain a healthy lifestyle:   Eat at least 3 REAL meals and 1-2 snacks per day.  Aim for no more than 5 hours between eating.  If you eat breakfast, please do so within one hour of getting up.    Each meal should contain half fruits/vegetables, one quarter protein, and one quarter carbs (no bigger than a computer mouse)   Cut down on sweet beverages. This includes juice, soda, and sweet tea.     Drink at least 1 glass of water with each meal and aim for at least 8 glasses per day   Exercise at least 150 minutes every week.

## 2020-02-22 ENCOUNTER — Other Ambulatory Visit: Payer: Self-pay | Admitting: Family Medicine

## 2020-02-27 ENCOUNTER — Telehealth: Payer: Self-pay | Admitting: Family Medicine

## 2020-02-27 ENCOUNTER — Other Ambulatory Visit: Payer: Self-pay | Admitting: Ophthalmology

## 2020-02-27 NOTE — Telephone Encounter (Signed)
Please advice  

## 2020-02-27 NOTE — Telephone Encounter (Signed)
We are booked this afternoon. Ok with seeing him tomorrow if he can wait that long - recommend he come in at 2:00.   [name]. [name], MD 02/27/2020 1:01 PM

## 2020-02-27 NOTE — Telephone Encounter (Signed)
Patient had an "emotional break down" this morning.  Would like to know if Dr. [name] would work him in this afternoon?   If not, Dr. [name] has a 220pm tomorrow but on 20 min slot.  Can pt be worked in at that time?

## 2020-02-27 NOTE — Telephone Encounter (Signed)
Patient scheduled.

## 2020-02-28 ENCOUNTER — Other Ambulatory Visit: Payer: Self-pay | Admitting: Ophthalmology

## 2020-02-28 ENCOUNTER — Encounter: Payer: Self-pay | Admitting: Family Medicine

## 2020-02-28 ENCOUNTER — Ambulatory Visit: Payer: HMO | Admitting: Family Medicine

## 2020-02-28 DIAGNOSIS — H4312 Vitreous hemorrhage, left eye: Secondary | ICD-10-CM | POA: Diagnosis not present

## 2020-02-28 DIAGNOSIS — E113592 Type 2 diabetes mellitus with proliferative diabetic retinopathy without macular edema, left eye: Secondary | ICD-10-CM | POA: Diagnosis not present

## 2020-02-28 NOTE — Telephone Encounter (Signed)
I spoke with the pt, he says that he is fine and confirmed that he would like the appointment cancelled. Appointment has been cancelled.

## 2020-03-05 ENCOUNTER — Other Ambulatory Visit (HOSPITAL_COMMUNITY)
Admission: RE | Admit: 2020-03-05 | Discharge: 2020-03-05 | Disposition: A | Discharge status: Home / Self Care | Payer: HMO | Source: Ambulatory Visit | Attending: Ophthalmology | Admitting: Ophthalmology

## 2020-03-05 DIAGNOSIS — Z01812 Encounter for preprocedural laboratory examination: Secondary | ICD-10-CM | POA: Insufficient documentation

## 2020-03-05 DIAGNOSIS — Z20822 Contact with and (suspected) exposure to covid-19: Secondary | ICD-10-CM | POA: Insufficient documentation

## 2020-03-05 LAB — SARS CORONAVIRUS 2 (TAT 6-24 HRS): SARS Coronavirus 2: NEGATIVE

## 2020-03-07 ENCOUNTER — Encounter (HOSPITAL_COMMUNITY): Payer: Self-pay | Admitting: Ophthalmology

## 2020-03-07 ENCOUNTER — Ambulatory Visit: Payer: HMO | Admitting: Cardiovascular Disease

## 2020-03-07 NOTE — Anesthesia Preprocedure Evaluation (Addendum)
Anesthesia Evaluation  Patient identified by MRN, date of birth, ID band Patient awake    Reviewed: Allergy & Precautions, NPO status , Patient's Chart, lab work & pertinent test results, reviewed documented beta blocker date and time   Airway Mallampati: II  TM Distance: >3 FB Neck ROM: Full    Dental  (+) Dental Advisory Given   Pulmonary neg pulmonary ROS,    breath sounds clear to auscultation       Cardiovascular hypertension, Pt. on medications and Pt. on home beta blockers + CAD, + Past MI and +CHF   Rhythm:Regular Rate:Normal     Neuro/Psych  Neuromuscular disease    GI/Hepatic Neg liver ROS, GERD  ,  Endo/Other  diabetes, Type 2, Insulin Dependent  Renal/GU CRFRenal disease     Musculoskeletal   Abdominal   Peds  Hematology  (+) anemia ,   Anesthesia Other Findings   Reproductive/Obstetrics                            Anesthesia Physical Anesthesia Plan  ASA: III  Anesthesia Plan: MAC   Post-op Pain Management:    Induction: Intravenous  PONV Risk Score and Plan: 1 and Propofol infusion, Ondansetron and Treatment may vary due to age or medical condition  Airway Management Planned: Natural Airway and Simple Face Mask  Additional Equipment:   Intra-op Plan:   Post-operative Plan:   Informed Consent: I have reviewed the patients History and Physical, chart, labs and discussed the procedure including the risks, benefits and alternatives for the proposed anesthesia with the patient or authorized representative who has indicated his/her understanding and acceptance.       Plan Discussed with: CRNA  Anesthesia Plan Comments: (PAT note by Karoline Caldwell, PA-C: Follows with cardiology for history of CAD status post cardiac arrest and PCI to LAD 11/28/2013.  He presented with a cardiac arrest in 11/2013 and underwent PCI of the LAD at that time. At the end of 2016, he had an  episode of stabbing chest pain. 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 His Plavix was discontinued and he underwent several laser procedures. He had a PET CT/myocardial perfusion scan in 10/2015 that revealed a moderate sized, moderate intensity antero-apical infarct with mild ischemia. Myoview in 09/2017 showed prior apical infarct but no evidence of ischemia. Echo in 09/2017 showed LVEF of 45-50% with mild hypokinesis of the apical myocardium and grade 1 diastolic dysfunction.  Last seen by cardiology 01/26/2020 for preop evaluation.  Per note, "Patient has eye procedure planned in July. No acute cardiac symptoms. He has chronic shortness of breath with mild activity but this is unchanged. Per Duke Activity Status Index, he can complete 3.97 METS. However, eye procedures are low risk procedures and he has had similar procedures multiple times in the past and tolerated it well. Therefore, patient would be at acceptable risk for procedure without any additional cardiovascular work-up. For same eye procedure in 01/2019, Dr. Oval Linsey stated patient could hold Aspirin up to 5 days prior to procedure. Recommend the same for this procedure."  He was admitted in 08/2019 with acute renal failure on CKD stage III with creatinine of 2.08 (baseline around 1.4) in setting of poor PO intake after testing positive for COVID-19. He was treated with IV fluids and Remdesivir. Creatinine was 1.78 on  discharge.  Last creatinine in epic 1.67 on 09/01/2019.  IDDM 2, last A1c 8.0 on 12/01/2019.  Will need day of surgery labs and eval.  EKG 01/27/2020: NSR.  Rate 75.  LAD.  Low voltage QRS.  Possible anterolateral infarct, age undetermined.  TTE 07/22/19: 1. Left ventricular ejection fraction, by visual estimation, is 50 to  55%. The left ventricle has low normal function. Left ventricular septal  wall thickness  was mildly increased. There is no left ventricular  hypertrophy.  2. The left ventricle demonstrates regional wall motion abnormalities.  3. Normal GLS -18 Inferior basal and distal septal hypokinesis.  4. Global right ventricle has normal systolic function.The right  ventricular size is normal. No increase in right ventricular wall  thickness.  5. Left atrial size was mildly dilated.  6. Right atrial size was normal.  7. The mitral valve is normal in structure. Trivial mitral valve  regurgitation. No evidence of mitral stenosis.  8. The tricuspid valve is normal in structure. Tricuspid valve  regurgitation is not demonstrated.  9. The aortic valve is tricuspid. Aortic valve regurgitation is mild.  Mild to moderate aortic valve sclerosis/calcification without any evidence  of aortic stenosis.  10. The pulmonic valve was normal in structure. Pulmonic valve  regurgitation is not visualized.  11. Aortic dilatation noted.  12. There is mild dilatation of the aortic root measuring 39 mm.  13. The inferior vena cava is normal in size with greater than 50%  respiratory variability, suggesting right atrial pressure of 3 mmHg.   Nuclear stress 10/02/17: The left ventricular ejection fraction is mildly decreased (45-54%). Nuclear stress EF: 48%. There was no ST segment deviation noted during stress. Defect 1: There is a small defect of severe severity present in the apical inferior, apical lateral and apex location. This is a low risk study.   Abnormal, low risk stress nuclear study with prior apical infarct; no ischemia; EF 48 with mild global hypokinesis; mild LVE.  )       Anesthesia Quick Evaluation

## 2020-03-07 NOTE — Progress Notes (Signed)
Anesthesia Chart Review: Same-day work-up  Follows with cardiology for history of CAD status post cardiac arrest and PCI to LAD 11/28/2013.  He presented with a cardiac arrest in 11/2013 and underwent PCI of the LAD at that time. At the end of 2016, he had an episode of stabbing chest pain. 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 His Plavix was discontinued and he underwent several laser procedures. He had a PET CT/myocardial perfusion scan in 10/2015 that revealed a moderate sized, moderate intensity antero-apical infarct with mild ischemia. Myoview in 09/2017 showed prior apical infarct but no evidence of ischemia. Echo in 09/2017 showed LVEF of 45-50% with mild hypokinesis of the apical myocardium and grade 1 diastolic dysfunction.  Last seen by cardiology 01/26/2020 for preop evaluation.  Per note, "Patient has eye procedure planned in July. No acute cardiac symptoms. He has chronic shortness of breath with mild activity but this is unchanged. Per Duke Activity Status Index, he can complete 3.97 METS. However, eye procedures are low risk procedures and he has had similar procedures multiple times in the past and tolerated it well. Therefore, patient would be at acceptable risk for procedure without any additional cardiovascular work-up. For same eye procedure in 01/2019, Dr. Oval Linsey stated patient could hold Aspirin up to 5 days prior to procedure. Recommend the same for this procedure."  He was admitted in 08/2019 with acute renal failure on CKD stage III with creatinine of 2.08 (baseline around 1.4) in setting of poor PO intake after testing positive for COVID-19. He was treated with IV fluids and Remdesivir. Creatinine was 1.78 on discharge.  Last creatinine in epic 1.67 on 09/01/2019.  IDDM 2, last A1c 8.0 on 12/01/2019.  Will need day of surgery labs and eval.  EKG 01/27/2020: NSR.  Rate  75.  LAD.  Low voltage QRS.  Possible anterolateral infarct, age undetermined.  TTE 07/22/19: 1. Left ventricular ejection fraction, by visual estimation, is 50 to  55%. The left ventricle has low normal function. Left ventricular septal  wall thickness was mildly increased. There is no left ventricular  hypertrophy.  2. The left ventricle demonstrates regional wall motion abnormalities.  3. Normal GLS -18 Inferior basal and distal septal hypokinesis.  4. Global right ventricle has normal systolic function.The right  ventricular size is normal. No increase in right ventricular wall  thickness.  5. Left atrial size was mildly dilated.  6. Right atrial size was normal.  7. The mitral valve is normal in structure. Trivial mitral valve  regurgitation. No evidence of mitral stenosis.  8. The tricuspid valve is normal in structure. Tricuspid valve  regurgitation is not demonstrated.  9. The aortic valve is tricuspid. Aortic valve regurgitation is mild.  Mild to moderate aortic valve sclerosis/calcification without any evidence  of aortic stenosis.  10. The pulmonic valve was normal in structure. Pulmonic valve  regurgitation is not visualized.  11. Aortic dilatation noted.  12. There is mild dilatation of the aortic root measuring 39 mm.  13. The inferior vena cava is normal in size with greater than 50%  respiratory variability, suggesting right atrial pressure of 3 mmHg.   Nuclear stress 10/02/17:  The left ventricular ejection fraction is mildly decreased (45-54%).  Nuclear stress EF: 48%.  There was no ST segment deviation noted during stress.  Defect 1: There is a small defect of severe severity  present in the apical inferior, apical lateral and apex location.  This is a low risk study.   Abnormal, low risk stress nuclear study with prior apical infarct; no ischemia; EF 48 with mild global hypokinesis; mild LVE.   Wynonia Musty [facility] Short Stay  Center/Anesthesiology Phone [phone] 03/07/2020 1:07 PM

## 2020-03-09 ENCOUNTER — Encounter: Payer: Self-pay | Admitting: Family Medicine

## 2020-03-13 ENCOUNTER — Ambulatory Visit: Payer: HMO | Admitting: Family Medicine

## 2020-03-14 ENCOUNTER — Other Ambulatory Visit (HOSPITAL_COMMUNITY)
Admission: RE | Admit: 2020-03-14 | Discharge: 2020-03-14 | Disposition: A | Discharge status: Home / Self Care | Payer: HMO | Source: Ambulatory Visit | Attending: Ophthalmology | Admitting: Ophthalmology

## 2020-03-14 ENCOUNTER — Encounter (HOSPITAL_COMMUNITY): Payer: Self-pay | Admitting: Ophthalmology

## 2020-03-14 DIAGNOSIS — Z20822 Contact with and (suspected) exposure to covid-19: Secondary | ICD-10-CM | POA: Diagnosis not present

## 2020-03-14 DIAGNOSIS — Z01812 Encounter for preprocedural laboratory examination: Secondary | ICD-10-CM | POA: Insufficient documentation

## 2020-03-14 LAB — SARS CORONAVIRUS 2 (TAT 6-24 HRS): SARS Coronavirus 2: NEGATIVE

## 2020-03-14 NOTE — Progress Notes (Signed)
PCP -  Dr Dimas Chyle Cardiologist - Dr Skeet Latch  Chest x-ray - 08/14/19 (1V) EKG - 01/27/20 Stress Test - 10/02/17 ECHO - 07/22/19 PCI - 11/2013  Fasting Blood Sugar - 90-170s Checks Blood Sugar _6 _ times a day  . Do not take oral diabetes medicines (metformin) the morning of surgery.     . THE MORNING OF SURGERY, take 15 units Lantus Insulin.   . Do not take Novolog Insulin on DOS unless your CBG is greater than 220 mg/dL, then you may take  of your sliding scale (correction) dose.  . If your blood sugar is less than 70 mg/dL, you will need to treat for low blood sugar: o Treat a low blood sugar (less than 70 mg/dL) with  cup of clear juice (cranberry or apple), 4 glucose tablets, OR glucose gel. o Recheck blood sugar in 15 minutes after treatment (to make sure it is greater than 70 mg/dL). If your blood sugar is not greater than 70 mg/dL on recheck, call [phone] for further instructions.  Aspirin Instructions: Last dose on 03/04/20.  Anesthesia review: Yes  STOP now taking any Aspirin (unless otherwise instructed by your surgeon), Aleve, Naproxen, Ibuprofen, Motrin, Advil, Goody's, BC's, all herbal medications, fish oil, and all vitamins.   Coronavirus Screening Covid test scheduled on 03/14/20. Do you have any of the following symptoms:  Cough yes/no: No Fever (>100.66F)  yes/no: No Runny nose yes/no: No Sore throat yes/no: No Difficulty breathing/shortness of breath  yes/no: No  Have you traveled in the last 14 days and where? yes/no: No  Patient verbalized understanding of instructions that were given via phone.

## 2020-03-15 ENCOUNTER — Ambulatory Visit (HOSPITAL_COMMUNITY)
Admission: RE | Admit: 2020-03-15 | Discharge: 2020-03-15 | Disposition: A | Discharge status: Home / Self Care | Payer: HMO | Source: Ambulatory Visit | Attending: Ophthalmology | Admitting: Ophthalmology

## 2020-03-15 ENCOUNTER — Encounter (HOSPITAL_COMMUNITY): Payer: Self-pay | Admitting: Ophthalmology

## 2020-03-15 ENCOUNTER — Ambulatory Visit (HOSPITAL_COMMUNITY): Payer: HMO | Admitting: Physician Assistant

## 2020-03-15 ENCOUNTER — Encounter (HOSPITAL_COMMUNITY)
Admission: RE | Disposition: A | Discharge status: Home / Self Care | Payer: Self-pay | Source: Ambulatory Visit | Attending: Ophthalmology

## 2020-03-15 ENCOUNTER — Other Ambulatory Visit: Payer: Self-pay

## 2020-03-15 DIAGNOSIS — E113512 Type 2 diabetes mellitus with proliferative diabetic retinopathy with macular edema, left eye: Secondary | ICD-10-CM | POA: Diagnosis not present

## 2020-03-15 DIAGNOSIS — Z7982 Long term (current) use of aspirin: Secondary | ICD-10-CM | POA: Diagnosis not present

## 2020-03-15 DIAGNOSIS — E113593 Type 2 diabetes mellitus with proliferative diabetic retinopathy without macular edema, bilateral: Secondary | ICD-10-CM | POA: Diagnosis not present

## 2020-03-15 DIAGNOSIS — E785 Hyperlipidemia, unspecified: Secondary | ICD-10-CM | POA: Diagnosis not present

## 2020-03-15 DIAGNOSIS — I509 Heart failure, unspecified: Secondary | ICD-10-CM | POA: Insufficient documentation

## 2020-03-15 DIAGNOSIS — I110 Hypertensive heart disease with heart failure: Secondary | ICD-10-CM | POA: Insufficient documentation

## 2020-03-15 DIAGNOSIS — E1136 Type 2 diabetes mellitus with diabetic cataract: Secondary | ICD-10-CM | POA: Diagnosis not present

## 2020-03-15 DIAGNOSIS — E113592 Type 2 diabetes mellitus with proliferative diabetic retinopathy without macular edema, left eye: Secondary | ICD-10-CM | POA: Diagnosis not present

## 2020-03-15 DIAGNOSIS — E11319 Type 2 diabetes mellitus with unspecified diabetic retinopathy without macular edema: Secondary | ICD-10-CM | POA: Diagnosis present

## 2020-03-15 DIAGNOSIS — Z955 Presence of coronary angioplasty implant and graft: Secondary | ICD-10-CM | POA: Insufficient documentation

## 2020-03-15 DIAGNOSIS — Z79899 Other long term (current) drug therapy: Secondary | ICD-10-CM | POA: Diagnosis not present

## 2020-03-15 DIAGNOSIS — I252 Old myocardial infarction: Secondary | ICD-10-CM | POA: Insufficient documentation

## 2020-03-15 DIAGNOSIS — Z794 Long term (current) use of insulin: Secondary | ICD-10-CM | POA: Diagnosis not present

## 2020-03-15 DIAGNOSIS — I5042 Chronic combined systolic (congestive) and diastolic (congestive) heart failure: Secondary | ICD-10-CM | POA: Diagnosis not present

## 2020-03-15 DIAGNOSIS — N189 Chronic kidney disease, unspecified: Secondary | ICD-10-CM | POA: Diagnosis not present

## 2020-03-15 DIAGNOSIS — I130 Hypertensive heart and chronic kidney disease with heart failure and stage 1 through stage 4 chronic kidney disease, or unspecified chronic kidney disease: Secondary | ICD-10-CM | POA: Diagnosis not present

## 2020-03-15 DIAGNOSIS — H4312 Vitreous hemorrhage, left eye: Secondary | ICD-10-CM

## 2020-03-15 DIAGNOSIS — E1122 Type 2 diabetes mellitus with diabetic chronic kidney disease: Secondary | ICD-10-CM | POA: Diagnosis not present

## 2020-03-15 DIAGNOSIS — Z8674 Personal history of sudden cardiac arrest: Secondary | ICD-10-CM | POA: Insufficient documentation

## 2020-03-15 HISTORY — PX: PHOTOCOAGULATION WITH LASER: SHX6027

## 2020-03-15 HISTORY — PX: PARS PLANA VITRECTOMY: SHX2166

## 2020-03-15 HISTORY — DX: Other amnesia: R41.3

## 2020-03-15 HISTORY — DX: Vitreous hemorrhage, left eye: H43.12

## 2020-03-15 LAB — BASIC METABOLIC PANEL
Anion gap: 11 (ref 5–15)
BUN: 58 mg/dL — ABNORMAL HIGH (ref 8–23)
CO2: 20 mmol/L — ABNORMAL LOW (ref 22–32)
Calcium: 9.2 mg/dL (ref 8.9–10.3)
Chloride: 107 mmol/L (ref 98–111)
Creatinine, Ser: 1.97 mg/dL — ABNORMAL HIGH (ref 0.61–1.24)
GFR calc Af Amer: 40 mL/min — ABNORMAL LOW (ref >60)
GFR calc non Af Amer: 35 mL/min — ABNORMAL LOW (ref >60)
Glucose, Bld: 115 mg/dL — ABNORMAL HIGH (ref 70–99)
Potassium: 4.6 mmol/L (ref 3.5–5.1)
Sodium: 138 mmol/L (ref 135–145)

## 2020-03-15 LAB — CBC
HCT: 36.9 % — ABNORMAL LOW (ref 39.0–52.0)
Hemoglobin: 12.3 g/dL — ABNORMAL LOW (ref 13.0–17.0)
MCH: 31.1 pg (ref 26.0–34.0)
MCHC: 33.3 g/dL (ref 30.0–36.0)
MCV: 93.2 fL (ref 80.0–100.0)
Platelets: 433 10*3/uL — ABNORMAL HIGH (ref 150–400)
RBC: 3.96 MIL/uL — ABNORMAL LOW (ref 4.22–5.81)
RDW: 12.9 % (ref 11.5–15.5)
WBC: 11 10*3/uL — ABNORMAL HIGH (ref 4.0–10.5)
nRBC: 0 % (ref 0.0–0.2)

## 2020-03-15 LAB — GLUCOSE, CAPILLARY
Glucose-Capillary: 123 mg/dL — ABNORMAL HIGH (ref 70–99)
Glucose-Capillary: 56 mg/dL — ABNORMAL LOW (ref 70–99)
Glucose-Capillary: 80 mg/dL (ref 70–99)

## 2020-03-15 SURGERY — PARS PLANA VITRECTOMY WITH 25 GAUGE
Anesthesia: Monitor Anesthesia Care | Site: Eye | Laterality: Left

## 2020-03-15 MED ORDER — BUPIVACAINE HCL (PF) 0.75 % IJ SOLN
INTRAMUSCULAR | Status: AC
  Filled 2020-03-15: qty 10

## 2020-03-15 MED ORDER — CHLORHEXIDINE GLUCONATE 0.12 % MT SOLN: 15.0000 mL | Freq: Once | OROMUCOSAL | Status: AC

## 2020-03-15 MED ORDER — SODIUM CHLORIDE 0.9 % IV SOLN: INTRAVENOUS | Status: DC

## 2020-03-15 MED ORDER — FENTANYL CITRATE (PF) 100 MCG/2ML IJ SOLN: INTRAMUSCULAR | Status: DC | PRN

## 2020-03-15 MED ORDER — ONDANSETRON HCL 4 MG/2ML IJ SOLN
INTRAMUSCULAR | Status: AC
  Filled 2020-03-15: qty 2

## 2020-03-15 MED ORDER — PHENYLEPHRINE 40 MCG/ML (10ML) SYRINGE FOR IV PUSH (FOR BLOOD PRESSURE SUPPORT)
PREFILLED_SYRINGE | INTRAVENOUS | Status: DC | PRN
  Administered 2020-03-15: 80 ug via INTRAVENOUS

## 2020-03-15 MED ORDER — LIDOCAINE HCL 2 % IJ SOLN
INTRAMUSCULAR | Status: DC | PRN
  Administered 2020-03-15: 7 mL via RETROBULBAR

## 2020-03-15 MED ORDER — PROPOFOL 10 MG/ML IV BOLUS
INTRAVENOUS | Status: AC
  Filled 2020-03-15: qty 20

## 2020-03-15 MED ORDER — SODIUM HYALURONATE 10 MG/ML IO SOLN
INTRAOCULAR | Status: AC
  Filled 2020-03-15: qty 0.85

## 2020-03-15 MED ORDER — ATROPINE SULFATE 1 % OP SOLN
1.0000 [drp] | OPHTHALMIC | Status: DC | PRN
  Administered 2020-03-15 (×3): 1 [drp] via OPHTHALMIC
  Filled 2020-03-15: qty 5

## 2020-03-15 MED ORDER — BSS PLUS IO SOLN
INTRAOCULAR | Status: AC
  Filled 2020-03-15: qty 500

## 2020-03-15 MED ORDER — DEXAMETHASONE SODIUM PHOSPHATE 10 MG/ML IJ SOLN
INTRAMUSCULAR | Status: AC
  Filled 2020-03-15: qty 1

## 2020-03-15 MED ORDER — MIDAZOLAM HCL 5 MG/5ML IJ SOLN
INTRAMUSCULAR | Status: DC | PRN
  Administered 2020-03-15: 2 mg via INTRAVENOUS

## 2020-03-15 MED ORDER — PROPOFOL 10 MG/ML IV BOLUS
INTRAVENOUS | Status: DC | PRN
  Administered 2020-03-15: 30 mg via INTRAVENOUS

## 2020-03-15 MED ORDER — TETRACAINE HCL 0.5 % OP SOLN
OPHTHALMIC | Status: AC
  Filled 2020-03-15: qty 4

## 2020-03-15 MED ORDER — NA CHONDROIT SULF-NA HYALURON 40-30 MG/ML IO SOLN
INTRAOCULAR | Status: AC
  Filled 2020-03-15: qty 0.5

## 2020-03-15 MED ORDER — ONDANSETRON HCL 4 MG/2ML IJ SOLN
INTRAMUSCULAR | Status: DC | PRN
  Administered 2020-03-15: 4 mg via INTRAVENOUS

## 2020-03-15 MED ORDER — LACTATED RINGERS IV SOLN: INTRAVENOUS | Status: DC | PRN

## 2020-03-15 MED ORDER — ATROPINE SULFATE 1 % OP SOLN
OPHTHALMIC | Status: AC
  Filled 2020-03-15: qty 5

## 2020-03-15 MED ORDER — TRIAMCINOLONE ACETONIDE 40 MG/ML IJ SUSP
INTRAMUSCULAR | Status: AC
  Filled 2020-03-15: qty 5

## 2020-03-15 MED ORDER — VANCOMYCIN INTRAVITREAL INJECTION 1 MG/0.1 ML
INTRAOCULAR | Status: DC | PRN
Start: 2020-03-15 — End: 2020-03-15
  Administered 2020-03-15: 5 mg via INTRAVITREAL

## 2020-03-15 MED ORDER — LIDOCAINE 2% (20 MG/ML) 5 ML SYRINGE
INTRAMUSCULAR | Status: AC
  Filled 2020-03-15: qty 5

## 2020-03-15 MED ORDER — VANCOMYCIN INTRAVITREAL INJECTION 1 MG/0.1 ML
1.0000 mg | INTRAOCULAR | Status: DC
  Filled 2020-03-15 (×2): qty 0.1

## 2020-03-15 MED ORDER — EPINEPHRINE PF 1 MG/ML IJ SOLN
INTRAMUSCULAR | Status: AC
  Filled 2020-03-15: qty 1

## 2020-03-15 MED ORDER — CHLORHEXIDINE GLUCONATE 0.12 % MT SOLN
OROMUCOSAL | Status: AC
  Administered 2020-03-15: 15 mL via OROMUCOSAL
  Filled 2020-03-15: qty 15

## 2020-03-15 MED ORDER — PHENYLEPHRINE 40 MCG/ML (10ML) SYRINGE FOR IV PUSH (FOR BLOOD PRESSURE SUPPORT)
PREFILLED_SYRINGE | INTRAVENOUS | Status: AC
  Filled 2020-03-15: qty 10

## 2020-03-15 MED ORDER — ROCURONIUM BROMIDE 10 MG/ML (PF) SYRINGE
PREFILLED_SYRINGE | INTRAVENOUS | Status: AC
  Filled 2020-03-15: qty 10

## 2020-03-15 MED ORDER — LIDOCAINE 2% (20 MG/ML) 5 ML SYRINGE
INTRAMUSCULAR | Status: DC | PRN
  Administered 2020-03-15: 100 mg via INTRAVENOUS

## 2020-03-15 MED ORDER — MIDAZOLAM HCL 2 MG/2ML IJ SOLN
INTRAMUSCULAR | Status: AC
  Filled 2020-03-15: qty 2

## 2020-03-15 MED ORDER — DEXAMETHASONE SODIUM PHOSPHATE 10 MG/ML IJ SOLN
INTRAMUSCULAR | Status: DC | PRN
  Administered 2020-03-15: 10 mg

## 2020-03-15 MED ORDER — PROVISC 10 MG/ML IO SOLN
INTRAOCULAR | Status: DC | PRN
Start: 2020-03-15 — End: 2020-03-15
  Administered 2020-03-15: .85 mL via INTRAOCULAR

## 2020-03-15 MED ORDER — LIDOCAINE HCL 2 % IJ SOLN
INTRAMUSCULAR | Status: AC
  Filled 2020-03-15: qty 20

## 2020-03-15 MED ORDER — TOBRAMYCIN-DEXAMETHASONE 0.3-0.1 % OP OINT
TOPICAL_OINTMENT | OPHTHALMIC | Status: DC | PRN
  Administered 2020-03-15: 1 via OPHTHALMIC

## 2020-03-15 MED ORDER — BEVACIZUMAB CHEMO INJECTION 1.25MG/0.05ML SYRINGE FOR KALEIDOSCOPE
INTRAVITREAL | Status: DC | PRN
  Administered 2020-03-15: 2.5 mg via INTRAVITREAL

## 2020-03-15 MED ORDER — FENTANYL CITRATE (PF) 250 MCG/5ML IJ SOLN
INTRAMUSCULAR | Status: AC
  Filled 2020-03-15: qty 5

## 2020-03-15 MED ORDER — ORAL CARE MOUTH RINSE: 15.0000 mL | Freq: Once | OROMUCOSAL | Status: AC

## 2020-03-15 MED ORDER — EPINEPHRINE PF 1 MG/ML IJ SOLN
INTRAOCULAR | Status: DC | PRN
  Administered 2020-03-15: 500 mL

## 2020-03-15 MED ORDER — PHENYLEPHRINE HCL 2.5 % OP SOLN
1.0000 [drp] | OPHTHALMIC | Status: DC | PRN
  Administered 2020-03-15 (×3): 1 [drp] via OPHTHALMIC
  Filled 2020-03-15: qty 2

## 2020-03-15 MED ORDER — TOBRAMYCIN-DEXAMETHASONE 0.3-0.1 % OP OINT
TOPICAL_OINTMENT | OPHTHALMIC | Status: AC
  Filled 2020-03-15: qty 3.5

## 2020-03-15 MED ORDER — BSS IO SOLN
INTRAOCULAR | Status: AC
  Filled 2020-03-15: qty 15

## 2020-03-15 SURGICAL SUPPLY — 58 items
BAND WRIST GAS GREEN (MISCELLANEOUS) IMPLANT
BLADE MVR KNIFE 20G (BLADE) IMPLANT
CANNULA ANT CHAM MAIN (OPHTHALMIC RELATED) IMPLANT
CANNULA ANTERIOR CHAMBER 27GA (MISCELLANEOUS) IMPLANT
CANNULA DUAL BORE 23G (CANNULA) IMPLANT
CANNULA DUALBORE 25G (CANNULA) IMPLANT
CANNULA VLV SOFT TIP 25G (OPHTHALMIC) ×1 IMPLANT
CANNULA VLV SOFT TIP 25GA (OPHTHALMIC) ×2 IMPLANT
CAUTERY EYE LOW TEMP 1300F FIN (OPHTHALMIC RELATED) IMPLANT
CLSR STERI-STRIP ANTIMIC 1/2X4 (GAUZE/BANDAGES/DRESSINGS) ×2 IMPLANT
CORD BIPOLAR FORCEPS 12FT (ELECTRODE) IMPLANT
COVER MAYO STAND STRL (DRAPES) ×2 IMPLANT
DRAPE HALF SHEET 40X57 (DRAPES) ×2 IMPLANT
DRAPE INCISE 51X51 W/FILM STRL (DRAPES) IMPLANT
DRAPE RETRACTOR (MISCELLANEOUS) ×2 IMPLANT
ERASER HMR WETFIELD 23G BP (MISCELLANEOUS) IMPLANT
FILTER BLUE MILLIPORE (MISCELLANEOUS) IMPLANT
FORCEPS ECKARDT ILM 25G SERR (OPHTHALMIC RELATED) IMPLANT
FORCEPS GRIESHABER ILM 25G A (INSTRUMENTS) IMPLANT
GAS AUTO FILL CONSTEL (OPHTHALMIC)
GAS AUTO FILL CONSTELLATION (OPHTHALMIC) IMPLANT
GAS WRIST BAND GREEN (MISCELLANEOUS)
GLOVE BIO SURGEON STRL SZ7.5 (GLOVE) ×2 IMPLANT
GOWN STRL REUS W/ TWL LRG LVL3 (GOWN DISPOSABLE) ×2 IMPLANT
GOWN STRL REUS W/TWL LRG LVL3 (GOWN DISPOSABLE) ×2
HANDLE PNEUMATIC FOR CONSTEL (OPHTHALMIC) IMPLANT
KIT BASIN OR (CUSTOM PROCEDURE TRAY) ×2 IMPLANT
KIT TURNOVER KIT B (KITS) IMPLANT
LENS BIOM SUPER VIEW SET DISP (MISCELLANEOUS) ×2 IMPLANT
MICROPICK 25G (MISCELLANEOUS)
NEEDLE 18GX1X1/2 (RX/OR ONLY) (NEEDLE) ×2 IMPLANT
NEEDLE 25GX 5/8IN NON SAFETY (NEEDLE) ×2 IMPLANT
NEEDLE FILTER BLUNT 18X 1/2SAF (NEEDLE)
NEEDLE FILTER BLUNT 18X1 1/2 (NEEDLE) IMPLANT
NEEDLE HYPO 25GX1X1/2 BEV (NEEDLE) IMPLANT
NEEDLE HYPO 30X.5 LL (NEEDLE) ×2 IMPLANT
NEEDLE RETROBULBAR 25GX1.5 (NEEDLE) ×2 IMPLANT
NS IRRIG 1000ML POUR BTL (IV SOLUTION) ×2 IMPLANT
PAD ARMBOARD 7.5X6 YLW CONV (MISCELLANEOUS) ×2 IMPLANT
PAK PIK VITRECTOMY CVS 25GA (OPHTHALMIC) ×4 IMPLANT
PENCIL BIPOLAR 25GA STR DISP (OPHTHALMIC RELATED) IMPLANT
PICK MICROPICK 25G (MISCELLANEOUS) IMPLANT
PROBE ENDO DIATHERMY 25G (MISCELLANEOUS) ×2 IMPLANT
PROBE LASER ILLUM FLEX CVD 25G (OPHTHALMIC) ×2 IMPLANT
ROLLS DENTAL (MISCELLANEOUS) IMPLANT
SCRAPER DIAMOND 25GA (OPHTHALMIC RELATED) IMPLANT
STOCKINETTE IMPERVIOUS 9X36 MD (GAUZE/BANDAGES/DRESSINGS) ×4 IMPLANT
STOPCOCK 4 WAY LG BORE MALE ST (IV SETS) IMPLANT
SUT ETHILON 8 0 TG100 8 (SUTURE) IMPLANT
SUT VICRYL 7 0 TG140 8 (SUTURE) IMPLANT
SUT VICRYL 8 0 TG140 8 (SUTURE) IMPLANT
SUT VICRYL ABS 6-0 S29 18IN (SUTURE) IMPLANT
SYR 10ML LL (SYRINGE) IMPLANT
SYR 20ML LL LF (SYRINGE) ×2 IMPLANT
SYR 5ML LL (SYRINGE) ×2 IMPLANT
SYR TB 1ML LUER SLIP (SYRINGE) IMPLANT
TUBE CONNECTING 12X1/4 (SUCTIONS) IMPLANT
WATER STERILE IRR 1000ML POUR (IV SOLUTION) ×2 IMPLANT

## 2020-03-15 NOTE — Transfer of Care (Signed)
Immediate Anesthesia Transfer of Care Note  Patient: Jeffrey [name]  Procedure(s) Performed: PARS PLANA VITRECTOMY WITH 25 GAUGE (Left Eye) PHOTOCOAGULATION WITH LASER; INTRAVITREAL INJECTION OF AVASTIN (Left Eye)  Patient Location: PACU  Anesthesia Type:MAC  Level of Consciousness: drowsy and patient cooperative  Airway & Oxygen Therapy: Patient Spontanous Breathing  Post-op Assessment: Report given to RN and Post -op Vital signs reviewed and stable  Post vital signs: Reviewed and stable  Last Vitals:  Vitals Value Taken Time  BP 121/67 03/15/20 1530  Temp    Pulse 72 03/15/20 1531  Resp 20 03/15/20 1531  SpO2 99 % 03/15/20 1531  Vitals shown include unvalidated device data.  Last Pain:  Vitals:   03/15/20 1234  TempSrc:   PainSc: 0-No pain         Complications: No complications documented.

## 2020-03-15 NOTE — Anesthesia Postprocedure Evaluation (Signed)
Anesthesia Post Note  Patient: Jeffrey [name]  Procedure(s) Performed: PARS PLANA VITRECTOMY WITH 25 GAUGE (Left Eye) PHOTOCOAGULATION WITH LASER; INTRAVITREAL INJECTION OF AVASTIN (Left Eye)     Patient location during evaluation: PACU Anesthesia Type: MAC Level of consciousness: awake and alert Pain management: pain level controlled Vital Signs Assessment: post-procedure vital signs reviewed and stable Respiratory status: spontaneous breathing, nonlabored ventilation and respiratory function stable Cardiovascular status: stable and blood pressure returned to baseline Postop Assessment: no apparent nausea or vomiting Anesthetic complications: no   No complications documented.  Last Vitals:  Vitals:   03/15/20 1600 03/15/20 1615  BP: 117/69 104/60  Pulse: 70 72  Resp: 17 17  Temp:  36.5 C  SpO2: 100% 100%    Last Pain:  Vitals:   03/15/20 1600  TempSrc:   PainSc: 0-No pain                 Karanvir Balderston,W. EDMOND

## 2020-03-15 NOTE — Op Note (Signed)
NAMEJOSUA, [name] MEDICAL RECORD [id number] ACCOUNT [id number] DATE OF BIRTH:1956-04-22 FACILITY: [facility] LOCATION: [facility] PHYSICIAN:Kristl Morioka Greg Cutter, MD  OPERATIVE REPORT  DATE OF PROCEDURE:  03/15/2020  SURGEON:  [name], MD   ANESTHESIA:  Local MAC, retrobulbar injection on the left eye.  PREOPERATIVE DIAGNOSES: 1.  Proliferative diabetic retinopathy. 2.  Vitreous hemorrhage, both in the left eye.  POSTOPERATIVE DIAGNOSES:   1.  Proliferative diabetic retinopathy. 2.  Vitreous hemorrhage, both in the left eye.  OPERATIVE PROCEDURE:  A 25-gauge pars plana vitrectomy with endocautery endolaser and intravitreal Avastin injection in the left eye.  COMPLICATIONS:  None.  FINDINGS:  There was a dense vitreous hemorrhage and proliferative diabetic retinopathy.  DESCRIPTION OF PROCEDURE:  The patient was identified in the preoperative holding area.  A signed consent form was placed on the chart.  He was then taken to the operating room.  At that point in time, the left eye was anesthetized using a retrobulbar  block consisting of a 1:1 mixture of 0.75% bupivacaine, 1% lidocaine and 150 units of Hylenex.  After excellent akinesia and anesthesia was obtained, the left eye was prepped and draped in the usual sterile fashion for ocular surgery.  A Lieberman  speculum was placed between the left upper and lower eyelids for exposure.  A 25-gauge trocars were used to introduce transconjunctival cannulas in the inferotemporal, supratemporal, and supranasal quadrants.  Trocars were removed, leaving the cannulas  in place.  An infusion cannula was then attached to the inferior nasal cannula and confirmed to be in the vitreous cavity prior to its use.  It was then turned on.  The eye then underwent a core vitrectomy with the vitreous cutter and light pipe.  The  core vitreous and hemorrhage was removed.  Next, the vitrector was used to engage and elevate the posterior hyaloid off the  optic nerve head and this was further dissected off to the periphery.  Once the hyaloid was elevated off the retina, the vitreous  was cleaned out completely using the vitrector.  Next, endocautery was used to cauterize neovascular tissue that was oozing slightly.  Soft tip extrusion cannula was used to remove any residual preretinal hemorrhage.  The eye cleaned up quite nicely.  At  this point in time, there was complete hemostasis.  Therefore, endolaser photocoagulation was used to complete a panretinal photocoagulation pattern.  This was performed without difficulty.  The laser was removed.  The eye was then treated with 1 mg of  intravitreal Avastin, which was injected into the vitreous cavity via an existing cannula using 25-gauge needle.  The needle was removed.  After the Avastin was then placed, the cannulas were removed from their sclerotomies.  Sclerotomies were inspected  and found to be watertight.  Next, the eye was treated with 15 mg of vancomycin and 1 mg of dexamethasone.  Both of these were delivered subconjunctivally.  Finally the speculum was removed.  Eyelids were cleaned and closed and then patch shielded.  The  patient was then taken to recovery in excellent condition, having tolerated the procedure well.  Prior to closing the eyelids, the eye was treated with 1 drop of 1% atropine and TobraDex ointment.  VN/NUANCE  D:03/15/2020 T:03/15/2020 JOB:[id number]

## 2020-03-15 NOTE — Brief Op Note (Signed)
03/15/2020  3:32 PM  PATIENT:  [name]  64 y.o. male  PRE-OPERATIVE DIAGNOSIS:  VENTRIUS HEMORAGE; PROLIFURATIVE DIABETIC RETINOPATHY  POST-OPERATIVE DIAGNOSIS:  VENTRIUS HEMORAGE; PROLIFURATIVE DIABETIC   PROCEDURE:  Procedure(s): PARS PLANA VITRECTOMY WITH 25 GAUGE (Left) PHOTOCOAGULATION WITH LASER; INTRAVITREAL INJECTION OF AVASTIN (Left)  SURGEON:  Surgeon(s) and Role:    [name], MD - Primary  PHYSICIAN ASSISTANT:   ASSISTANTS: none   ANESTHESIA:   MAC  EBL:  5 mL   BLOOD ADMINISTERED:none  DRAINS: none   LOCAL MEDICATIONS USED:  BUPIVICAINE   SPECIMEN:  No Specimen  DISPOSITION OF SPECIMEN:  N/A  COUNTS:  YES  TOURNIQUET:  * No tourniquets in log *  DICTATION: .Other Dictation: Dictation Number [id number]  PLAN OF CARE: Discharge to home after PACU  PATIENT DISPOSITION:  PACU - hemodynamically stable.   Delay start of Pharmacological VTE agent (>24hrs) due to surgical blood loss or risk of bleeding: not applicable

## 2020-03-15 NOTE — Progress Notes (Signed)
Hypoglycemic Event  CBG:56 Treatment: 4 oz juice/soda  Symptoms: None  Follow-up CBG: Time:1620 CBG Result:80  Possible Reasons for Event: Inadequate meal intake    Ana Liaw, Jacinto Halim

## 2020-03-15 NOTE — H&P (Signed)
Jeffrey [name] is an 64 y.o. male.   Chief Complaint: decreased vision OS  HPI: diagnosed with PDR and VH OS  Past Medical History:  Diagnosis Date  . Cardiac arrest ([place]) 05/22/2016  . Cataract   . CHF (congestive heart failure) ([place])   . Diabetes mellitus without complication ([place])    type 2  . Diabetic retinopathy ([place])   . Hyperlipidemia   . Hypertension   . Memory loss    mild  . Myocardial infarct ([place]) 2105  . Retinopathy    Both eyes  . S/P primary angioplasty with coronary stent 05/22/2016  . Vitamin B12 deficiency   . Vitreous hemorrhage of left eye ([name])    proliferative diabetic retinopathy    Past Surgical History:  Procedure Laterality Date  . ANGIOPLASTY     2015  . COLONOSCOPY     multiple  . EYE SURGERY    . REFRACTIVE SURGERY    . UPPER GI ENDOSCOPY     several  . VITRECTOMY      Family History  Problem Relation Age of Onset  . Alzheimer's disease Mother   . Heart disease Father   . Peripheral Artery Disease Father   . Heart disease Brother   . Colon polyps Neg Hx   . Prostate cancer Neg Hx   . Rectal cancer Neg Hx   . Esophageal cancer Neg Hx    Social History:  reports that he has never smoked. He has never used smokeless tobacco. He reports previous alcohol use. He reports that he does not use drugs.  Allergies:  Allergies  Allergen Reactions  . Codeine Nausea And Vomiting  . Penicillins Rash    Did it involve swelling of the face/tongue/throat, SOB, or low BP? no Did it involve sudden or severe rash/hives, skin peeling, or any reaction on the inside of your mouth or nose? yes Did you need to seek medical attention at a hospital or doctor's office? yes When did it last happen?childhood If all above answers are "NO", may proceed with cephalosporin use.     Medications Prior to Admission  Medication Sig Dispense Refill  . acetaminophen (TYLENOL) 325 MG tablet Take 325-650 mg by mouth every 6 (six) hours as needed for moderate  pain.    [name] Kitchen aspirin EC 81 MG tablet Take 81 mg by mouth daily.    [name] Kitchen atorvastatin (LIPITOR) 80 MG tablet TAKE 1 TABLET BY MOUTH EVERY DAY (Patient taking differently: Take 80 mg by mouth daily. ) 90 tablet 2  . carvedilol (COREG) 3.125 MG tablet TAKE 1 TABLET BY MOUTH 2 TIMES DAILY WITH MEAL (Patient taking differently: Take 3.125 mg by mouth 2 (two) times daily with a meal. ) 180 tablet 1  . furosemide (LASIX) 40 MG tablet TAKE 1 TABLET BY MOUTH EVERY DAY (Patient taking differently: Take 40 mg by mouth daily. ) 90 tablet 1  . insulin aspart (NOVOLOG) 100 UNIT/ML injection Use 5-15 units three times daily as needed for elevated blood sugars. 5 Units for sugar >200, 10 units for sugar >300, 15 units for sugar 400+ (Patient taking differently: Inject 5-15 Units into the skin 3 (three) times daily as needed for high blood sugar. 5 Units for sugar >200, 10 units for sugar >300, 15 units for sugar 400+) 10 mL 11  . Insulin Glargine (LANTUS SOLOSTAR) 100 UNIT/ML Solostar Pen Inject 30 Units into the skin daily. 15 mL 11  . metFORMIN (GLUCOPHAGE-XR) 500 MG 24 hr tablet Take 2 tablets (  1,000 mg total) by mouth 2 (two) times daily. 360 tablet 1  . spironolactone (ALDACTONE) 25 MG tablet Take 1 tablet (25 mg total) by mouth daily. 90 tablet 3  . vitamin B-12 (CYANOCOBALAMIN) 1000 MCG tablet Take 1,000 mcg by mouth daily.    . Blood Glucose Monitoring Suppl (ONETOUCH VERIO IQ SYSTEM) w/Device KIT 1 kit by Does not apply route 4 (four) times daily. 1 kit 0  . glucose blood test strip Check blood sugar 4 times per day 200 each 11  . Insulin Pen Needle (PEN NEEDLES) 33G X 4 MM MISC 1 application by Does not apply route in the morning, at noon, in the evening, and at bedtime. 300 each prn  . Insulin Syringe-Needle U-100 (B-D INS SYR HALF-UNIT .3CC/31G) 31G X 5/16" 0.3 ML MISC Use 6 times a day 200 each 11  . OneTouch Delica Lancets 11B MISC Use to check blood sugar 4 times per day 200 each 11  . triamcinolone cream  (KENALOG) 0.1 % Apply 1 application topically 2 (two) times daily. 454 g 0    Results for orders placed or performed during the hospital encounter of 03/15/20 (from the past 48 hour(s))  Glucose, capillary     Status: Abnormal   Collection Time: 03/15/20 12:14 PM  Result Value Ref Range   Glucose-Capillary 123 (H) 70 - 99 mg/dL    Comment: Glucose reference range applies only to samples taken after fasting for at least 8 hours.  Basic metabolic panel per protocol     Status: Abnormal   Collection Time: 03/15/20  1:05 PM  Result Value Ref Range   Sodium 138 135 - 145 mmol/L   Potassium 4.6 3.5 - 5.1 mmol/L   Chloride 107 98 - 111 mmol/L   CO2 20 (L) 22 - 32 mmol/L   Glucose, Bld 115 (H) 70 - 99 mg/dL    Comment: Glucose reference range applies only to samples taken after fasting for at least 8 hours.   BUN 58 (H) 8 - 23 mg/dL   Creatinine, Ser 1.97 (H) 0.61 - 1.24 mg/dL   Calcium 9.2 8.9 - 10.3 mg/dL   GFR calc non Af Amer 35 (L) >60 mL/min   GFR calc Af Amer 40 (L) >60 mL/min   Anion gap 11 5 - 15    Comment: Performed at [facility] [address] [place], [place] [address]  CBC per protocol     Status: Abnormal   Collection Time: 03/15/20  1:05 PM  Result Value Ref Range   WBC 11.0 (H) 4.0 - 10.5 K/uL   RBC 3.96 (L) 4.22 - 5.81 MIL/uL   Hemoglobin 12.3 (L) 13.0 - 17.0 g/dL   HCT 36.9 (L) 39 - 52 %   MCV 93.2 80.0 - 100.0 fL   MCH 31.1 26.0 - 34.0 pg   MCHC 33.3 30.0 - 36.0 g/dL   RDW 12.9 11.5 - 15.5 %   Platelets 433 (H) 150 - 400 K/uL   nRBC 0.0 0.0 - 0.2 %    Comment: Performed at [facility], [place] [address] [place], [place] [address]   No results found.  Review of Systems  Eyes: Positive for visual disturbance.  All other systems reviewed and are negative.   Blood pressure 132/69, pulse 82, temperature 97.7 F (36.5 C), temperature source Temporal, resp. rate 18, height '5\' 8"'  (1.727 m), weight 71.7 kg, SpO2 100 %. Physical Exam Constitutional:       Appearance:  Normal appearance. He is normal weight.  Eyes:     General: Lids are normal.     Extraocular Movements: Extraocular movements intact.     Conjunctiva/sclera: Conjunctivae normal.   Neurological:     Mental Status: He is alert.      Assessment/Plan 1. PPV/EC/EL/Avastin OS  Corliss Parish, MD 03/15/2020, 2:00 PM

## 2020-03-16 ENCOUNTER — Encounter (HOSPITAL_COMMUNITY): Payer: Self-pay | Admitting: Ophthalmology

## 2020-03-16 ENCOUNTER — Encounter: Payer: Self-pay | Admitting: Family Medicine

## 2020-03-16 DIAGNOSIS — H31092 Other chorioretinal scars, left eye: Secondary | ICD-10-CM | POA: Diagnosis not present

## 2020-03-16 DIAGNOSIS — H4312 Vitreous hemorrhage, left eye: Secondary | ICD-10-CM | POA: Diagnosis not present

## 2020-03-16 DIAGNOSIS — E113592 Type 2 diabetes mellitus with proliferative diabetic retinopathy without macular edema, left eye: Secondary | ICD-10-CM | POA: Diagnosis not present

## 2020-03-19 ENCOUNTER — Ambulatory Visit: Payer: HMO | Admitting: Orthopedic Surgery

## 2020-03-19 NOTE — Telephone Encounter (Signed)
Lvm for Proxy to call the office to give message below.

## 2020-03-22 ENCOUNTER — Telehealth: Payer: Self-pay | Admitting: Family Medicine

## 2020-03-22 NOTE — Telephone Encounter (Signed)
Patient is returning phone call for [facility]

## 2020-03-22 NOTE — Telephone Encounter (Signed)
Notified patient I will keep a eye out on records from his surgery

## 2020-03-23 DIAGNOSIS — E113592 Type 2 diabetes mellitus with proliferative diabetic retinopathy without macular edema, left eye: Secondary | ICD-10-CM | POA: Diagnosis not present

## 2020-03-27 ENCOUNTER — Other Ambulatory Visit: Payer: Self-pay | Admitting: Family Medicine

## 2020-03-28 ENCOUNTER — Encounter: Payer: Self-pay | Admitting: Family Medicine

## 2020-04-06 ENCOUNTER — Other Ambulatory Visit: Payer: Self-pay

## 2020-04-06 ENCOUNTER — Encounter: Payer: Self-pay | Admitting: Cardiovascular Disease

## 2020-04-06 ENCOUNTER — Ambulatory Visit: Payer: HMO | Admitting: Cardiovascular Disease

## 2020-04-06 VITALS — BP 106/52 | HR 77 | Ht 68.0 in | Wt 155.8 lb

## 2020-04-06 DIAGNOSIS — I10 Essential (primary) hypertension: Secondary | ICD-10-CM

## 2020-04-06 DIAGNOSIS — I2510 Atherosclerotic heart disease of native coronary artery without angina pectoris: Secondary | ICD-10-CM

## 2020-04-06 DIAGNOSIS — Z9861 Coronary angioplasty status: Secondary | ICD-10-CM

## 2020-04-06 DIAGNOSIS — E1159 Type 2 diabetes mellitus with other circulatory complications: Secondary | ICD-10-CM

## 2020-04-06 DIAGNOSIS — I152 Hypertension secondary to endocrine disorders: Secondary | ICD-10-CM

## 2020-04-06 NOTE — Patient Instructions (Signed)
Medication Instructions:  Your physician recommends that you continue on your current medications as directed. Please refer to the Current Medication list given to you today.  *If you need a refill on your cardiac medications before your next appointment, please call your pharmacy*  Lab Work: NONE  Testing/Procedures: NONE  Follow-Up: At [facility], you and your health needs are our priority.  As part of our continuing mission to provide you with exceptional heart care, we have created designated Provider Care Teams.  These Care Teams include your primary Cardiologist (physician) and Advanced Practice Providers (APPs -  Physician Assistants and Nurse Practitioners) who all work together to provide you with the care you need, when you need it.  We recommend signing up for the patient portal called "MyChart".  Sign up information is provided on this After Visit Summary.  MyChart is used to connect with patients for Virtual Visits (Telemedicine).  Patients are able to view lab/test results, encounter notes, upcoming appointments, etc.  Non-urgent messages can be sent to your provider as well.   To learn more about what you can do with MyChart, go to [email or web address].    Your next appointment:   6 month(s)  The format for your next appointment:   In Person  Provider:   You may see Tiffany Rest Haven, MD or one of the following Advanced Practice Providers on your designated Care Team:    Luke Kilroy, PA-C  Callie Goodrich, PA-C  Jesse Cleaver, FNP   

## 2020-04-06 NOTE — Progress Notes (Signed)
Cardiology Office Note  Date:  04/06/2020   ID:  Jeffrey [name], DOB 02-18-1956, MRN [id number]  PCP:  Jeffrey Barrack, MD  Cardiologist:   Jeffrey Latch, MD   No chief complaint on file.   History of Present Illness: Jeffrey [name] is a 64 y.o. male with CAD s/p MI, hypertension, hyperlipidemia, diabetes and peripheral neuropathy who presents for follow up. Jeffrey [name] had a cardiac arrest 11/23/13 and underwent PCI of the LAD. Echo 10/2015 revealed an anteroseptal and apical septal infarct with associated hypokinesis. LVEF was 55%. He had a PET CT/myocardial perfusion scan 10/2015 that revealed a moderate sized, moderate intensity antero-apical infarct with mild ischemia. At the end of 2016 he had an episode of stabbing chest pain. 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 At that time his clopidogrel was discontinued and he underwent several laser procedures. He previously worked in a hospice facility as an Administrator, arts. He is now on disability because he is unable to lift patients.   [name] reported atypical chest pain. He was referred for [facility] 09/2017 that revealed LVEF 48% and a prior infarct in the apical inferior, apical lateral, and apical regions. There was no ischemia. He has struggled with syncope and his antihypertensives were reduced at prior appointments.   Since his last appointment [name] was seen in the ED 05/2019 with an episode of syncope.  He noticed that his gardeners were cutting his azaleas.  He tried running around and started getting anxioius.  There was no preceding chest pain but he did start feeling dizzy.  He thinks that this was mostly due to anxiety and getting worked up.  This occurred in the setting of generalized weakness, weight loss, and loose stools.  His work-up was remarkable only for mild intravascular volume depletion.  He received IV fluids and was instructed to follow-up  with his PCP the following day.  He saw Dr. [name] who recommended an echocardiogram but he declined.  Since that time he has been doing well.  He denies any recent dizziness or presyncope.    Lately Jeffrey [name] has been doing well.  He required injections in his L eye for diabetic retinopathy and vitreous hemorrhage.  His vision has improved.  He has been feeling well and denies any lightheadedness or dizziness.  He does not check his blood pressure at home regularly.  His breathing has been stable.  He tries to do his exercise bike several times per weeks and feels good with exertion.  He has been struggling with his balance and feels like he is being pushed forward when he walks.  He has follow-up scheduled with orthopedics.  He is going to get his booster shot for COVID-19 next month.   Past Medical History:  Diagnosis Date  . Cardiac arrest ([name]) 05/22/2016  . Cataract   . CHF (congestive heart failure) (Jeffrey [name])   . Diabetes mellitus without complication ([name])    type 2  . Diabetic retinopathy (Jeffrey [name])   . Hyperlipidemia   . Hypertension   . Memory loss    mild  . Myocardial infarct (Jeffrey [name]) 2105  . Retinopathy    Both eyes  . S/P primary angioplasty with coronary stent 05/22/2016  . Vitamin B12 deficiency   . Vitreous hemorrhage of left eye ([name])    proliferative diabetic retinopathy    Past Surgical History:  Procedure Laterality Date  . ANGIOPLASTY     2015  .  COLONOSCOPY     multiple  . EYE SURGERY    . PARS PLANA VITRECTOMY Left 03/15/2020   Procedure: PARS PLANA VITRECTOMY WITH 25 GAUGE;  Surgeon: Jeffrey Stalls, MD;  Location: [place];  Service: Ophthalmology;  Laterality: Left;  . PHOTOCOAGULATION WITH LASER Left 03/15/2020   Procedure: PHOTOCOAGULATION WITH LASER; INTRAVITREAL INJECTION OF AVASTIN;  Surgeon: Jeffrey Stalls, MD;  Location: [name];  Service: Ophthalmology;  Laterality: Left;  . REFRACTIVE SURGERY    . UPPER GI ENDOSCOPY     several  . VITRECTOMY       Current  Outpatient Medications  Medication Sig Dispense Refill  . acetaminophen (TYLENOL) 325 MG tablet Take 325-650 mg by mouth every 6 (six) hours as needed for moderate pain.    Jeffrey [name] aspirin EC 81 MG tablet Take 81 mg by mouth daily.    Jeffrey [name] atorvastatin (LIPITOR) 80 MG tablet TAKE 1 TABLET BY MOUTH EVERY DAY 90 tablet 2  . Blood Glucose Monitoring Suppl (ONETOUCH VERIO IQ SYSTEM) w/Device KIT 1 kit by Does not apply route 4 (four) times daily. 1 kit 0  . carvedilol (COREG) 3.125 MG tablet TAKE 1 TABLET BY MOUTH 2 TIMES DAILY WITH MEAL 180 tablet 1  . DUREZOL 0.05 % EMUL Place 1 drop into the left eye 2 (two) times daily.    . furosemide (LASIX) 40 MG tablet TAKE 1 TABLET BY MOUTH EVERY DAY 90 tablet 1  . glucose blood test strip Check blood sugar 4 times per day 200 each 11  . insulin aspart (NOVOLOG) 100 UNIT/ML injection Use 5-15 units three times daily as needed for elevated blood sugars. 5 Units for sugar >200, 10 units for sugar >300, 15 units for sugar 400+ (Patient taking differently: Inject 5-15 Units into the skin 3 (three) times daily as needed for high blood sugar. 5 Units for sugar >200, 10 units for sugar >300, 15 units for sugar 400+) 10 mL 11  . Insulin Glargine (LANTUS SOLOSTAR) 100 UNIT/ML Solostar Pen Inject 30 Units into the skin daily. 15 mL 11  . Insulin Pen Needle (PEN NEEDLES) 33G X 4 MM MISC 1 application by Does not apply route in the morning, at noon, in the evening, and at bedtime. 300 each prn  . Insulin Syringe-Needle U-100 (B-D INS SYR HALF-UNIT .3CC/31G) 31G X 5/16" 0.3 ML MISC Use 6 times a day 200 each 11  . ofloxacin (OCUFLOX) 0.3 % ophthalmic solution Place 1 drop into the left eye 4 (four) times daily.    Glory Rosebush Delica Lancets 34V MISC Use to check blood sugar 4 times per day 200 each 11  . spironolactone (ALDACTONE) 25 MG tablet Take 1 tablet (25 mg total) by mouth daily. 90 tablet 3  . triamcinolone cream (KENALOG) 0.1 % Apply 1 application topically 2 (two) times  daily. 454 g 0  . vitamin B-12 (CYANOCOBALAMIN) 1000 MCG tablet Take 1,000 mcg by mouth daily.    . metFORMIN (GLUCOPHAGE-XR) 500 MG 24 hr tablet Take 2 tablets (1,000 mg total) by mouth 2 (two) times daily. 360 tablet 1   No current facility-administered medications for this visit.    Allergies:   Codeine and Penicillins    Social History:  The patient  reports that he has never smoked. He has never used smokeless tobacco. He reports previous alcohol use. He reports that he does not use drugs.   Family History:  The patient's family history includes Alzheimer's disease in his mother; Heart disease in his  brother and father; Peripheral Artery Disease in his father.    ROS:  Please see the history of present illness.   Otherwise, review of systems are positive for cold hands.   All other systems are reviewed and negative.    PHYSICAL EXAM: VS:  BP (!) 106/52   Pulse 77   Ht '5\' 8"'  (1.727 m)   Wt 155 lb 12.8 oz (70.7 kg)   SpO2 98%   BMI 23.69 kg/m  , BMI Body mass index is 23.69 kg/m. GENERAL:  Well appearing HEENT: Pupils equal round and reactive, fundi not visualized, oral mucosa unremarkable NECK:  No jugular venous distention, waveform within normal limits, carotid upstroke brisk and symmetric, no bruits LUNGS:  Clear to auscultation bilaterally HEART:  RRR.  PMI not displaced or sustained,S1 and S2 within normal limits, no S3, no S4, no clicks, no rubs, no murmurs ABD:  Flat, positive bowel sounds normal in frequency in pitch, no bruits, no rebound, no guarding, no midline pulsatile mass, no hepatomegaly, no splenomegaly EXT:  2 plus pulses throughout, no edema, no cyanosis no clubbing SKIN:  No rashes no nodules NEURO:  Cranial nerves II through XII grossly intact, motor grossly intact throughout PSYCH:  Cognitively intact, oriented to person place and time   EKG:  EKG is not ordered today. The ekg ordered 05/22/16 demonstrates sinus rhythm rate 63 bpm.  Prior anteroseptal  infarct.  LAFB 11/18/16: Sinus rhythm.  Rate 64 bpm.  Low voltage limb and precordial leads 09/16/17: Sinus bradycardia rate 59 bpm.   04/22/18: sinus rhythm.  Rate 69 bpm.  Low voltage.  Prior anterioseptal infarct 07/11/19: Sinus rhythm rate 75 bpm.Prior anterior infarct.  Echo 07/22/19: 1. Left ventricular ejection fraction, by visual estimation, is 50 to  55%. The left ventricle has low normal function. Left ventricular septal  wall thickness was mildly increased. There is no left ventricular  hypertrophy.  2. The left ventricle demonstrates regional wall motion abnormalities.  3. Normal GLS -18 Inferior basal and distal septal hypokinesis.  4. Global right ventricle has normal systolic function.The right  ventricular size is normal. No increase in right ventricular wall  thickness.  5. Left atrial size was mildly dilated.  6. Right atrial size was normal.  7. The mitral valve is normal in structure. Trivial mitral valve  regurgitation. No evidence of mitral stenosis.  8. The tricuspid valve is normal in structure. Tricuspid valve  regurgitation is not demonstrated.  9. The aortic valve is tricuspid. Aortic valve regurgitation is mild.  Mild to moderate aortic valve sclerosis/calcification without any evidence  of aortic stenosis.  10. The pulmonic valve was normal in structure. Pulmonic valve  regurgitation is not visualized.  11. Aortic dilatation noted.  12. There is mild dilatation of the aortic root measuring 39 mm.  13. The inferior vena cava is normal in size with greater than 50%  respiratory variability, suggesting right atrial pressure of 3 mmHg.    Echo 11/01/15: LVEF >55%.  Mild concentric LVH. Mild anteroseptal and apical septal hypokinesis. Normal RV function.  Lexiscan Myoview 09/2017:  The left ventricular ejection fraction is mildly decreased (45-54%).  Nuclear stress EF: 48%.  There was no ST segment deviation noted during stress.  Defect 1: There  is a small defect of severe severity present in the apical inferior, apical lateral and apex location.  This is a low risk study.   Abnormal, low risk stress nuclear study with prior apical infarct; no ischemia; EF 48 with  mild global hypokinesis; mild LVE.   Recent Labs: 08/14/2019: B Natriuretic Peptide 70.8 08/15/2019: TSH 1.943 08/17/2019: Magnesium 2.0 09/01/2019: ALT 40 03/15/2020: BUN 58; Creatinine, Ser 1.97; Hemoglobin 12.3; Platelets 433; Potassium 4.6; Sodium 138    Lipid Panel    Component Value Date/Time   CHOL 116 01/06/2019 0819   TRIG 103 08/14/2019 2044   HDL 30 (L) 01/06/2019 0819   CHOLHDL 3.9 01/06/2019 0819   CHOLHDL 4 05/24/2018 0923   VLDL 34.8 05/24/2018 0923   LDLCALC 58 01/06/2019 0819    09/07/15: Sodium 138, potassium 4.5, BUN 20, creatinine 1.02 AST 17, ALT 24 Total cholesterol 113, triglycerides 146, HDL 32, LDL 52 TSH 1.76 WBC 11.7, hemoglobin 13.4, hematocrit 40.2, platelets 317 Hemoglobin A1c 8.9 on a relatively dry%  Wt Readings from Last 3 Encounters:  04/06/20 155 lb 12.8 oz (70.7 kg)  03/15/20 158 lb (71.7 kg)  02/17/20 160 lb 3.2 oz (72.7 kg)     ASSESSMENT AND PLAN:  # Syncope: # Hypertension:  # Chronic diastolic heart failure: LVEF improved to 50-55%.  BPis stable and his dizziness has improved. His blood pressure is still low after stopping losartan. However he has no more dizziness. We discussed cutting spironolactone in half but he does not think he will be able to do this. He will continue on carvedilol, furosemide, and spironolactone. If he does develop any dizziness or his blood pressure gets any lower we will just stop the spironolactone.   # CAD s/p cardiac arrest and LAD PCI:  No ischemic symptoms.  Lexiscan Myoview was negative for ischemia 09/2017. Continue aspirin, atorvastatin, and carvedilol.   # Hyperlipidemia.LDL 58 on 12/2018.  Continue atorvastatin.  He will have lipids and a CMP checked with his PCP in  November.    Current medicines are reviewed at length with the patient today.  The patient does not have concerns regarding medicines.  The following changes have been made: none Labs/ tests ordered today include:   No orders of the defined types were placed in this encounter.    Disposition:   FU with Vasil Juhasz C. Oval Linsey, MD, [facility] in 6 months.    Signed, Falesha Schommer C. Oval Linsey, MD, [facility]  04/06/2020 10:52 AM    [facility]

## 2020-04-10 ENCOUNTER — Ambulatory Visit: Payer: Self-pay

## 2020-04-10 ENCOUNTER — Encounter: Payer: Self-pay | Admitting: Orthopedic Surgery

## 2020-04-10 ENCOUNTER — Ambulatory Visit (INDEPENDENT_AMBULATORY_CARE_PROVIDER_SITE_OTHER): Payer: HMO

## 2020-04-10 ENCOUNTER — Ambulatory Visit (INDEPENDENT_AMBULATORY_CARE_PROVIDER_SITE_OTHER): Payer: HMO | Admitting: Orthopedic Surgery

## 2020-04-10 DIAGNOSIS — M79671 Pain in right foot: Secondary | ICD-10-CM

## 2020-04-10 DIAGNOSIS — M6701 Short Achilles tendon (acquired), right ankle: Secondary | ICD-10-CM | POA: Diagnosis not present

## 2020-04-10 DIAGNOSIS — M79672 Pain in left foot: Secondary | ICD-10-CM

## 2020-04-10 DIAGNOSIS — M6702 Short Achilles tendon (acquired), left ankle: Secondary | ICD-10-CM

## 2020-04-10 NOTE — Progress Notes (Signed)
Office Visit Note   Patient: Jeffrey [name]           Date of Birth: [birth year]           MRN: [id number] Visit Date: 04/10/2020              Requested by: Vivi Barrack, MD [address] [place],  [place] [address] PCP: Vivi Barrack, MD  Chief Complaint  Patient presents with  . Left Foot - Pain  . Right Foot - Pain      HPI: Patient is a 64 year old gentleman who is seen for initial evaluation for difficulty with balance and bilateral foot pain.  Patient ambulates slowly with a cane patient states he worked in [employer] on [place] for most of his life.  He states he has been diabetic since about age 40.  He states he is done physical therapy but he had no benefit from the therapy.  Patient states his biggest problem is balance.  Patient states he has significant neuropathy as well.  Assessment & Plan: Visit Diagnoses:  1. Pain in right foot   2. Pain in left foot   3. Achilles tendon contracture, bilateral     Plan: Recommended patient start with a gym membership for strengthening of both lower extremities with his weakness he needs significant improvement in strength for his balance.  Patient was given instructions and demonstrated Achilles stretching to do 5 times a day.  Follow-Up Instructions: Return if symptoms worsen or fail to improve.   Ortho Exam  Patient is alert, oriented, no adenopathy, well-dressed, normal affect, normal respiratory effort. Examination patient has palpable pulses bilaterally he has good active plantar flexion and dorsiflexion with no motor weakness negative straight leg raise bilaterally no radicular symptoms.  Patient has significant Achilles contracture with dorsiflexion about 20 degrees short of neutral bilaterally.  Getting from a sitting to a standing position is extremely difficult and patient has significant weakness in both lower extremities.  Patient has no pain with ambulation.  Imaging: XR Foot Complete Left  Result Date:  04/10/2020 Three-view radiographs of the left foot shows no fractures there are calcified arteries extended into the midfoot  XR Foot Complete Right  Result Date: 04/10/2020 Three-view radiographs of the right foot shows no fractures there are calcified arteries extending into the first webspace.  No images are attached to the encounter.  Labs: Lab Results  Component Value Date   HGBA1C 8.0 (A) 12/01/2019   HGBA1C 9.1 (H) 09/01/2019   HGBA1C 8.8 (A) 05/19/2019   ESRSEDRATE 7 02/08/2019   CRP 6.3 (H) 08/19/2019   CRP 5.2 (H) 08/18/2019   CRP 6.0 (H) 08/17/2019   REPTSTATUS 08/19/2019 FINAL 08/14/2019   CULT  08/14/2019    NO GROWTH 5 DAYS Performed at [facility], [place] [address] [place], [place] [address]      Lab Results  Component Value Date   ALBUMIN 4.0 09/01/2019   ALBUMIN 2.9 (L) 08/19/2019   ALBUMIN 2.7 (L) 08/18/2019    Lab Results  Component Value Date   MG 2.0 08/17/2019   MG 1.4 (L) 08/15/2019   Lab Results  Component Value Date   VD25OH 17.41 (L) 08/17/2019   VD25OH 27 (L) 04/25/2016    No results found for: PREALBUMIN CBC EXTENDED Latest Ref Rng & Units 03/15/2020 09/01/2019 08/17/2019  WBC 4.0 - 10.5 K/uL 11.0(H) 10.1 7.3  RBC 4.22 - 5.81 MIL/uL 3.96(L) 3.51(L) 3.61(L)  HGB 13.0 - 17.0 g/dL 12.3(L)  11.0(L) 11.1(L)  HCT 39 - 52 % 36.9(L) 32.3(L) 33.2(L)  PLT 150 - 400 K/uL 433(H) 518.0(H) 336  NEUTROABS 1.7 - 7.7 K/uL - - -  LYMPHSABS 0.7 - 4.0 K/uL - - -     There is no height or weight on file to calculate BMI.  Orders:  Orders Placed This Encounter  Procedures  . XR Foot Complete Right  . XR Foot Complete Left   No orders of the defined types were placed in this encounter.    Procedures: No procedures performed  Clinical Data: No additional findings.  ROS:  All other systems negative, except as noted in the HPI. Review of Systems  Objective: Vital Signs: There were no vitals taken for this visit.  Specialty Comments:    No specialty comments available.  PMFS History: Patient Active Problem List   Diagnosis Date Noted  . Dermatitis 01/04/2020  . Unintentional weight loss with loose stools 05/19/2019  . Low vitamin B12 level 03/29/2019  . Heme positive stool 03/14/2019  . Normocytic anemia 03/14/2019  . Chronic diastolic heart failure ([place]) 02/08/2019  . Diabetic retinopathy ([place]) 09/08/2018  . Physical debility 05/24/2018  . Varicose veins of both lower extremities 03/01/2018  . Fatigue due to exertion 10/14/2017  . GERD (gastroesophageal reflux disease) 08/25/2017  . CAD S/P percutaneous coronary angioplasty 04/14/2017  . Hyperlipidemia associated with type 2 diabetes mellitus ([place]) 04/14/2017  . Diabetic peripheral neuropathy associated with type 2 diabetes mellitus ([place]) 08/17/2016  . T2DM (type 2 diabetes mellitus) ([place]) 05/02/2016  . Hypertension associated with diabetes ([place]) 05/02/2016   Past Medical History:  Diagnosis Date  . Cardiac arrest ([place]) 05/22/2016  . Cataract   . CHF (congestive heart failure) ([place])   . Diabetes mellitus without complication ([place])    type 2  . Diabetic retinopathy ([place])   . Hyperlipidemia   . Hypertension   . Memory loss    mild  . Myocardial infarct ([place]) 2105  . Retinopathy    Both eyes  . S/P primary angioplasty with coronary stent 05/22/2016  . Vitamin B12 deficiency   . Vitreous hemorrhage of left eye ([name])    proliferative diabetic retinopathy    Family History  Problem Relation Age of Onset  . Alzheimer's disease Mother   . Heart disease Father   . Peripheral Artery Disease Father   . Heart disease Brother   . Colon polyps Neg Hx   . Prostate cancer Neg Hx   . Rectal cancer Neg Hx   . Esophageal cancer Neg Hx     Past Surgical History:  Procedure Laterality Date  . ANGIOPLASTY     2015  . COLONOSCOPY     multiple  . EYE SURGERY    . PARS PLANA VITRECTOMY Left 03/15/2020   Procedure: PARS PLANA VITRECTOMY WITH 25 GAUGE;  Surgeon:  [name], MD;  Location: [facility];  Service: Ophthalmology;  Laterality: Left;  . PHOTOCOAGULATION WITH LASER Left 03/15/2020   Procedure: PHOTOCOAGULATION WITH LASER; INTRAVITREAL INJECTION OF AVASTIN;  Surgeon: [name], MD;  Location: [facility];  Service: Ophthalmology;  Laterality: Left;  . REFRACTIVE SURGERY    . UPPER GI ENDOSCOPY     several  . VITRECTOMY     Social History   Occupational History  . Not on file  Tobacco Use  . Smoking status: Never Smoker  . Smokeless tobacco: Never Used  Vaping Use  . Vaping Use: Never used  Substance and Sexual Activity  .  Alcohol use: Not Currently    Comment: occ  . Drug use: No  . Sexual activity: Not Currently

## 2020-04-17 ENCOUNTER — Ambulatory Visit: Payer: HMO | Attending: Internal Medicine

## 2020-04-17 DIAGNOSIS — Z23 Encounter for immunization: Secondary | ICD-10-CM

## 2020-04-17 NOTE — Progress Notes (Signed)
   Covid-19 Vaccination Clinic  Name:  Jeffrey [name]    MRN: [id number] DOB: June 08, 1956  04/17/2020  Jeffrey [name] was observed post Covid-19 immunization for 15 minutes without incident. He was provided with Vaccine Information Sheet and instruction to access the V-Safe system.   Jeffrey [name] was instructed to call 911 with any severe reactions post vaccine: [name] Kitchen Difficulty breathing  . Swelling of face and throat  . A fast heartbeat  . A bad rash all over body  . Dizziness and weakness

## 2020-04-18 DIAGNOSIS — E113593 Type 2 diabetes mellitus with proliferative diabetic retinopathy without macular edema, bilateral: Secondary | ICD-10-CM | POA: Diagnosis not present

## 2020-04-18 DIAGNOSIS — H4313 Vitreous hemorrhage, bilateral: Secondary | ICD-10-CM | POA: Diagnosis not present

## 2020-04-20 ENCOUNTER — Encounter: Payer: Self-pay | Admitting: Family Medicine

## 2020-04-20 ENCOUNTER — Ambulatory Visit (INDEPENDENT_AMBULATORY_CARE_PROVIDER_SITE_OTHER): Payer: HMO | Admitting: Family Medicine

## 2020-04-20 ENCOUNTER — Other Ambulatory Visit: Payer: Self-pay

## 2020-04-20 VITALS — BP 109/67 | HR 73 | Temp 97.8°F | Ht 68.0 in | Wt 155.4 lb

## 2020-04-20 DIAGNOSIS — Z978 Presence of other specified devices: Secondary | ICD-10-CM

## 2020-04-20 DIAGNOSIS — I10 Essential (primary) hypertension: Secondary | ICD-10-CM

## 2020-04-20 DIAGNOSIS — E1142 Type 2 diabetes mellitus with diabetic polyneuropathy: Secondary | ICD-10-CM

## 2020-04-20 DIAGNOSIS — Z794 Long term (current) use of insulin: Secondary | ICD-10-CM | POA: Diagnosis not present

## 2020-04-20 DIAGNOSIS — E1159 Type 2 diabetes mellitus with other circulatory complications: Secondary | ICD-10-CM

## 2020-04-20 DIAGNOSIS — I152 Hypertension secondary to endocrine disorders: Secondary | ICD-10-CM

## 2020-04-20 DIAGNOSIS — Z23 Encounter for immunization: Secondary | ICD-10-CM

## 2020-04-20 LAB — POCT GLYCOSYLATED HEMOGLOBIN (HGB A1C): Hemoglobin A1C: 7.7 % — AB (ref 4.0–5.6)

## 2020-04-20 NOTE — Progress Notes (Signed)
   Jeffrey [name] is a 64 y.o. male who presents today for an office visit.  Assessment/Plan:  Chronic Problems Addressed Today: Hypertension associated with diabetes ([place]) At goal.  Continue Coreg 3.125 mg twice daily and spironolactone 25 mg daily.  [name] A1c stable at 7.7.  Congratulated patient on hard work.  He has been try to be more active.  We will continue Metformin 1000 mg twice daily and Lantus 30 units daily.    Subjective:  HPI:  See A/p.         Objective:  Physical Exam: BP 109/67   Pulse 73   Temp 97.8 F (36.6 C)   Ht 5\' 8"  (1.727 m)   Wt 155 lb 6.4 oz (70.5 kg)   SpO2 100%   BMI 23.63 kg/m   Wt Readings from Last 3 Encounters:  04/20/20 155 lb 6.4 oz (70.5 kg)  04/06/20 155 lb 12.8 oz (70.7 kg)  03/15/20 158 lb (71.7 kg)  Gen: No acute distress, resting comfortably CV: Regular rate and rhythm with no murmurs appreciated Pulm: Normal work of breathing, clear to auscultation bilaterally with no crackles, wheezes, or rhonchi Neuro: Grossly normal, moves all extremities Psych: Normal affect and thought content      Ran Tullis M. [name], MD 04/20/2020 11:57 AM

## 2020-04-20 NOTE — Patient Instructions (Signed)
It was very nice to see you today!  I am happy with your blood sugar.  We will give your flu vaccine today.  Please come back in 3 months for your annual checkup with blood work.  Please collect any sooner if needed.  I will place a referral for you to have orthotics made.  Take care, Dr [name]  Please try these tips to maintain a healthy lifestyle:   Eat at least 3 REAL meals and 1-2 snacks per day.  Aim for no more than 5 hours between eating.  If you eat breakfast, please do so within one hour of getting up.    Each meal should contain half fruits/vegetables, one quarter protein, and one quarter carbs (no bigger than a computer mouse)   Cut down on sweet beverages. This includes juice, soda, and sweet tea.     Drink at least 1 glass of water with each meal and aim for at least 8 glasses per day   Exercise at least 150 minutes every week.

## 2020-04-20 NOTE — Assessment & Plan Note (Signed)
At goal.  Continue Coreg 3.125 mg twice daily and spironolactone 25 mg daily. 

## 2020-04-20 NOTE — Assessment & Plan Note (Signed)
A1c stable at 7.7.  Congratulated patient on hard work.  He has been try to be more active.  We will continue Metformin 1000 mg twice daily and Lantus 30 units daily.

## 2020-04-24 ENCOUNTER — Other Ambulatory Visit: Payer: Self-pay | Admitting: Family Medicine

## 2020-05-09 ENCOUNTER — Other Ambulatory Visit: Payer: Self-pay

## 2020-05-09 ENCOUNTER — Ambulatory Visit: Payer: HMO | Admitting: Dermatology

## 2020-05-09 DIAGNOSIS — L570 Actinic keratosis: Secondary | ICD-10-CM | POA: Diagnosis not present

## 2020-05-09 DIAGNOSIS — Z1283 Encounter for screening for malignant neoplasm of skin: Secondary | ICD-10-CM

## 2020-05-09 NOTE — Patient Instructions (Addendum)
Routine follow-up for Jeffrey [name] date of birth 1955-08-30.  Of chief concern was noticing new dark spots along the right temple scalp area. physical examination showed 3 brown 2 mm textured subtle elevations called seborrheic keratoses which do not require freezing or biopsy.  He has 4 similar spots that are little bit darker and larger on the middle of his back and the right lower back.  These also are safe to leave.  On the top of Jeffrey [name] scalp there are half dozen very small sandpaperlike crusts which are minor sun damage with low risk of developing nonmelanoma skin cancer.  If any of these should grow or bleed or bother him, he can return to remove them.  Routine follow-up can be in 1 year or on an as-needed basis.

## 2020-05-10 ENCOUNTER — Encounter (HOSPITAL_COMMUNITY): Payer: Self-pay | Admitting: Emergency Medicine

## 2020-05-10 ENCOUNTER — Emergency Department (HOSPITAL_COMMUNITY): Payer: HMO

## 2020-05-10 ENCOUNTER — Other Ambulatory Visit: Payer: Self-pay

## 2020-05-10 ENCOUNTER — Emergency Department (HOSPITAL_COMMUNITY)
Admission: EM | Admit: 2020-05-10 | Discharge: 2020-05-10 | Disposition: A | Discharge status: Home / Self Care | Payer: HMO | Attending: Emergency Medicine | Admitting: Emergency Medicine

## 2020-05-10 DIAGNOSIS — J90 Pleural effusion, not elsewhere classified: Secondary | ICD-10-CM | POA: Diagnosis not present

## 2020-05-10 DIAGNOSIS — I509 Heart failure, unspecified: Secondary | ICD-10-CM | POA: Insufficient documentation

## 2020-05-10 DIAGNOSIS — I110 Hypertensive heart disease with heart failure: Secondary | ICD-10-CM | POA: Diagnosis not present

## 2020-05-10 DIAGNOSIS — R42 Dizziness and giddiness: Secondary | ICD-10-CM | POA: Diagnosis not present

## 2020-05-10 DIAGNOSIS — R231 Pallor: Secondary | ICD-10-CM | POA: Diagnosis not present

## 2020-05-10 DIAGNOSIS — R55 Syncope and collapse: Secondary | ICD-10-CM | POA: Diagnosis not present

## 2020-05-10 DIAGNOSIS — Z7982 Long term (current) use of aspirin: Secondary | ICD-10-CM | POA: Diagnosis not present

## 2020-05-10 DIAGNOSIS — I491 Atrial premature depolarization: Secondary | ICD-10-CM | POA: Diagnosis not present

## 2020-05-10 DIAGNOSIS — E1142 Type 2 diabetes mellitus with diabetic polyneuropathy: Secondary | ICD-10-CM | POA: Diagnosis not present

## 2020-05-10 DIAGNOSIS — E860 Dehydration: Secondary | ICD-10-CM | POA: Diagnosis not present

## 2020-05-10 DIAGNOSIS — Z794 Long term (current) use of insulin: Secondary | ICD-10-CM | POA: Diagnosis not present

## 2020-05-10 DIAGNOSIS — Z79899 Other long term (current) drug therapy: Secondary | ICD-10-CM | POA: Insufficient documentation

## 2020-05-10 LAB — CBC WITH DIFFERENTIAL/PLATELET
Abs Immature Granulocytes: 0.03 10*3/uL (ref 0.00–0.07)
Basophils Absolute: 0.1 10*3/uL (ref 0.0–0.1)
Basophils Relative: 1 %
Eosinophils Absolute: 0.2 10*3/uL (ref 0.0–0.5)
Eosinophils Relative: 2 %
HCT: 33.7 % — ABNORMAL LOW (ref 39.0–52.0)
Hemoglobin: 11.2 g/dL — ABNORMAL LOW (ref 13.0–17.0)
Immature Granulocytes: 0 %
Lymphocytes Relative: 25 %
Lymphs Abs: 2.5 10*3/uL (ref 0.7–4.0)
MCH: 31.2 pg (ref 26.0–34.0)
MCHC: 33.2 g/dL (ref 30.0–36.0)
MCV: 93.9 fL (ref 80.0–100.0)
Monocytes Absolute: 0.9 10*3/uL (ref 0.1–1.0)
Monocytes Relative: 9 %
Neutro Abs: 6.2 10*3/uL (ref 1.7–7.7)
Neutrophils Relative %: 63 %
Platelets: 409 10*3/uL — ABNORMAL HIGH (ref 150–400)
RBC: 3.59 MIL/uL — ABNORMAL LOW (ref 4.22–5.81)
RDW: 12.4 % (ref 11.5–15.5)
WBC: 9.9 10*3/uL (ref 4.0–10.5)
nRBC: 0 % (ref 0.0–0.2)

## 2020-05-10 LAB — COMPREHENSIVE METABOLIC PANEL
ALT: 17 U/L (ref 0–44)
AST: 17 U/L (ref 15–41)
Albumin: 3.7 g/dL (ref 3.5–5.0)
Alkaline Phosphatase: 47 U/L (ref 38–126)
Anion gap: 12 (ref 5–15)
BUN: 47 mg/dL — ABNORMAL HIGH (ref 8–23)
CO2: 21 mmol/L — ABNORMAL LOW (ref 22–32)
Calcium: 8.9 mg/dL (ref 8.9–10.3)
Chloride: 104 mmol/L (ref 98–111)
Creatinine, Ser: 1.94 mg/dL — ABNORMAL HIGH (ref 0.61–1.24)
GFR calc Af Amer: 41 mL/min — ABNORMAL LOW (ref >60)
GFR calc non Af Amer: 35 mL/min — ABNORMAL LOW (ref >60)
Glucose, Bld: 181 mg/dL — ABNORMAL HIGH (ref 70–99)
Potassium: 4.2 mmol/L (ref 3.5–5.1)
Sodium: 137 mmol/L (ref 135–145)
Total Bilirubin: 1 mg/dL (ref 0.3–1.2)
Total Protein: 6.6 g/dL (ref 6.5–8.1)

## 2020-05-10 LAB — URINALYSIS, ROUTINE W REFLEX MICROSCOPIC
Bilirubin Urine: NEGATIVE
Glucose, UA: NEGATIVE mg/dL
Hgb urine dipstick: NEGATIVE
Ketones, ur: NEGATIVE mg/dL
Leukocytes,Ua: NEGATIVE
Nitrite: NEGATIVE
Protein, ur: NEGATIVE mg/dL
Specific Gravity, Urine: 1.011 (ref 1.005–1.030)
pH: 5 (ref 5.0–8.0)

## 2020-05-10 NOTE — Discharge Instructions (Addendum)
Please be sure to stay well-hydrated by drinking plenty of water. It can be helpful to spread small meals out throughout the day to ensure proper nutrition. Be sure to follow-up with your primary care provider on this matter. Return to the emergency department for recurrence of symptoms, chest pain, shortness of breath, abdominal pain, or any other major concerns.

## 2020-05-10 NOTE — ED Notes (Addendum)
Pt ambulated with walker (pt uses walker at home) about 40 ft with no issue. No complaints of dizziness or weakness while ambulating. Shawn, [place] notified.

## 2020-05-10 NOTE — ED Notes (Signed)
Got patient into a gown on the monitor did ekg shown to Dr B Tamera Punt patient is resting with call bell in reach

## 2020-05-10 NOTE — ED Triage Notes (Signed)
Pt BIB [facility] w/ complaints of dizziness and weakness. Pt reported using the restroom and felt dizzy but unsure of if he passed out or not. Pt reports n/v/d. EMS gave 4 mg of Zofran and 500 ml of fluid.   BP-156/96  CBG 77  Temp- 97.7  Pulse- 76

## 2020-05-10 NOTE — ED Provider Notes (Signed)
[facility] Provider Note   CSN: [id number] Arrival date & time: 05/10/20  0825     History Chief Complaint  Patient presents with  . Dizziness  . Weakness    Jeffrey [name] is a 64 y.o. male.  HPI      Jeffrey [name] is a 64 y.o. male, with a history of CHF, DM type II, hyperlipidemia, HTN, MI, presenting to the ED accompanied by his brother with dizziness that occurred around 7 AM this morning.  Patient states he sat up on the side of his bed in order to urinate using a urinal.  Shortly thereafter, he began to feel room spinning dizziness.  This caused him to slide off his bed onto the floor.  He called out to his brother and his brother was immediately at his side.  No noted seizure activity or altered mental status. He continued to feel dizzy until EMS arrival.  He began to feel better after they administered IV fluids.  Upon my interview, he is symptom-free. He admits to poor oral intake throughout the day yesterday. Denies anticoagulation. Denies fever/chills, recent illness, cough, chest pain, shortness of breath, abdominal pain, neck/back pain, numbness, weakness, urinary symptoms, headache, head injury, or any other complaints.   Past Medical History:  Diagnosis Date  . Cardiac arrest ([place]) 05/22/2016  . Cataract   . CHF (congestive heart failure) ([place])   . Diabetes mellitus without complication ([place])    type 2  . Diabetic retinopathy ([place])   . Hyperlipidemia   . Hypertension   . Memory loss    mild  . Myocardial infarct ([place]) 2105  . Retinopathy    Both eyes  . S/P primary angioplasty with coronary stent 05/22/2016  . Vitamin B12 deficiency   . Vitreous hemorrhage of left eye ([name])    proliferative diabetic retinopathy    Patient Active Problem List   Diagnosis Date Noted  . Dermatitis 01/04/2020  . Unintentional weight loss with loose stools 05/19/2019  . Low vitamin B12 level 03/29/2019  . Heme positive stool  03/14/2019  . Normocytic anemia 03/14/2019  . Chronic diastolic heart failure ([place]) 02/08/2019  . Diabetic retinopathy ([place]) 09/08/2018  . Physical debility 05/24/2018  . Varicose veins of both lower extremities 03/01/2018  . Fatigue due to exertion 10/14/2017  . GERD (gastroesophageal reflux disease) 08/25/2017  . CAD S/P percutaneous coronary angioplasty 04/14/2017  . Hyperlipidemia associated with type 2 diabetes mellitus ([place]) 04/14/2017  . Diabetic peripheral neuropathy associated with type 2 diabetes mellitus ([place]) 08/17/2016  . T2DM (type 2 diabetes mellitus) ([place]) 05/02/2016  . Hypertension associated with diabetes ([place]) 05/02/2016    Past Surgical History:  Procedure Laterality Date  . ANGIOPLASTY     2015  . COLONOSCOPY     multiple  . EYE SURGERY    . PARS PLANA VITRECTOMY Left 03/15/2020   Procedure: PARS PLANA VITRECTOMY WITH 25 GAUGE;  Surgeon: [name], MD;  Location: [facility];  Service: Ophthalmology;  Laterality: Left;  . PHOTOCOAGULATION WITH LASER Left 03/15/2020   Procedure: PHOTOCOAGULATION WITH LASER; INTRAVITREAL INJECTION OF AVASTIN;  Surgeon: [name], MD;  Location: [facility];  Service: Ophthalmology;  Laterality: Left;  . REFRACTIVE SURGERY    . UPPER GI ENDOSCOPY     several  . VITRECTOMY         Family History  Problem Relation Age of Onset  . Alzheimer's disease Mother   . Heart disease Father   . Peripheral Artery  Disease Father   . Heart disease Brother   . Colon polyps Neg Hx   . Prostate cancer Neg Hx   . Rectal cancer Neg Hx   . Esophageal cancer Neg Hx     Social History   Tobacco Use  . Smoking status: Never Smoker  . Smokeless tobacco: Never Used  Vaping Use  . Vaping Use: Never used  Substance Use Topics  . Alcohol use: Not Currently    Comment: occ  . Drug use: No    Home Medications Prior to Admission medications   Medication Sig Start Date End Date Taking? Authorizing Provider  acetaminophen (TYLENOL) 325 MG  tablet Take 325-650 mg by mouth every 6 (six) hours as needed for moderate pain.    [provider]  aspirin EC 81 MG tablet Take 81 mg by mouth daily.    [provider]  atorvastatin (LIPITOR) 80 MG tablet TAKE 1 TABLET BY MOUTH EVERY DAY 02/22/20   Vivi Barrack, MD  Blood Glucose Monitoring Suppl (ONETOUCH VERIO IQ SYSTEM) w/Device KIT 1 kit by Does not apply route 4 (four) times daily. 03/11/18   Vivi Barrack, MD  carvedilol (COREG) 3.125 MG tablet TAKE 1 TABLET BY MOUTH 2 TIMES DAILY WITH MEAL 03/27/20   Vivi Barrack, MD  DUREZOL 0.05 % EMUL Place 1 drop into the left eye 2 (two) times daily. 02/29/20   [provider]  furosemide (LASIX) 40 MG tablet TAKE 1 TABLET BY MOUTH EVERY DAY 11/25/19   Vivi Barrack, MD  insulin aspart (NOVOLOG) 100 UNIT/ML injection Use 5-15 units three times daily as needed for elevated blood sugars. 5 Units for sugar >200, 10 units for sugar >300, 15 units for sugar 400+ Patient taking differently: Inject 5-15 Units into the skin 3 (three) times daily as needed for high blood sugar. 5 Units for sugar >200, 10 units for sugar >300, 15 units for sugar 400+ 10/03/19   Vivi Barrack, MD  Insulin Glargine (LANTUS SOLOSTAR) 100 UNIT/ML Solostar Pen Inject 30 Units into the skin daily. 09/30/19   Vivi Barrack, MD  Insulin Pen Needle (PEN NEEDLES) 33G X 4 MM MISC 1 application by Does not apply route in the morning, at noon, in the evening, and at bedtime. 09/30/19   Vivi Barrack, MD  Insulin Syringe-Needle U-100 (B-D INS SYR HALF-UNIT .3CC/31G) 31G X 5/16" 0.3 ML MISC Use 6 times a day 09/07/17   Vivi Barrack, MD  metFORMIN (GLUCOPHAGE-XR) 500 MG 24 hr tablet Take 2 tablets (1,000 mg total) by mouth 2 (two) times daily. 12/01/19 02/29/20  Vivi Barrack, MD  ofloxacin (OCUFLOX) 0.3 % ophthalmic solution Place 1 drop into the left eye 4 (four) times daily. 02/28/20   [provider]  OneTouch Delica Lancets 16X MISC Use to check  blood sugar 4 times per day 03/18/19   Vivi Barrack, MD  [facility] VERIO test strip CHECK BLOOD SUGAR 4 TIMES PER DAY 04/24/20   Vivi Barrack, MD  spironolactone (ALDACTONE) 25 MG tablet Take 1 tablet (25 mg total) by mouth daily. 10/11/19   Vivi Barrack, MD  triamcinolone cream (KENALOG) 0.1 % Apply 1 application topically 2 (two) times daily. 01/04/20   Vivi Barrack, MD  vitamin B-12 (CYANOCOBALAMIN) 1000 MCG tablet Take 1,000 mcg by mouth daily.    [provider]    Allergies    Codeine and Penicillins  Review of Systems   Review of  Systems  Constitutional: Negative for chills, diaphoresis and fever.  Respiratory: Negative for cough and shortness of breath.   Cardiovascular: Negative for chest pain and leg swelling.  Gastrointestinal: Negative for abdominal pain, diarrhea, nausea and vomiting.  Genitourinary: Negative for dysuria and hematuria.  Musculoskeletal: Negative for back pain and neck pain.  Neurological: Positive for dizziness. Negative for syncope, weakness and numbness.  All other systems reviewed and are negative.   Physical Exam Updated Vital Signs BP 136/83   Pulse 61   Temp (!) 97.5 F (36.4 C) (Oral)   Resp 14   Ht '5\' 9"'  (1.753 m)   Wt 68.5 kg   SpO2 100%   BMI 22.30 kg/m   Physical Exam Vitals and nursing note reviewed.  Constitutional:      General: He is not in acute distress.    Appearance: He is well-developed. He is not diaphoretic.  HENT:     Head: Normocephalic and atraumatic.     Mouth/Throat:     Mouth: Mucous membranes are moist.     Pharynx: Oropharynx is clear.  Eyes:     Conjunctiva/sclera: Conjunctivae normal.  Cardiovascular:     Rate and Rhythm: Normal rate and regular rhythm.     Pulses: Normal pulses.          Radial pulses are 2+ on the right side and 2+ on the left side.       Posterior tibial pulses are 2+ on the right side and 2+ on the left side.     Heart sounds: Normal heart sounds.     Comments: Tactile  temperature in the extremities appropriate and equal bilaterally. Pulmonary:     Effort: Pulmonary effort is normal. No respiratory distress.     Breath sounds: Normal breath sounds.  Abdominal:     Palpations: Abdomen is soft.     Tenderness: There is no abdominal tenderness. There is no guarding.  Musculoskeletal:     Cervical back: Neck supple.     Right lower leg: No edema.     Left lower leg: No edema.  Lymphadenopathy:     Cervical: No cervical adenopathy.  Skin:    General: Skin is warm and dry.  Neurological:     Mental Status: He is alert.     Comments: No noted acute cognitive deficit. Sensation grossly intact to light touch in the extremities.   Grip strengths equal bilaterally.   Strength 5/5 in all extremities.  Coordination intact.  Cranial nerves III-XII grossly intact.  Handles oral secretions without noted difficulty.  No noted phonation or speech deficit. No facial droop.  No gait disturbance.  Patient walks with a walker at baseline and was able to ambulate without noted difficulty or onset of symptoms.  Psychiatric:        Mood and Affect: Mood and affect normal.        Speech: Speech normal.        Behavior: Behavior normal.     ED Results / Procedures / Treatments   Labs (all labs ordered are listed, but only abnormal results are displayed) Labs Reviewed  URINALYSIS, ROUTINE W REFLEX MICROSCOPIC - Abnormal; Notable for the following components:      Result Value   Color, Urine STRAW (*)    All other components within normal limits  CBC WITH DIFFERENTIAL/PLATELET - Abnormal; Notable for the following components:   RBC 3.59 (*)    Hemoglobin 11.2 (*)    HCT 33.7 (*)  Platelets 409 (*)    All other components within normal limits  COMPREHENSIVE METABOLIC PANEL - Abnormal; Notable for the following components:   CO2 21 (*)    Glucose, Bld 181 (*)    BUN 47 (*)    Creatinine, Ser 1.94 (*)    GFR calc non Af Amer 35 (*)    GFR calc Af Amer 41 (*)     All other components within normal limits    BUN  Date Value Ref Range Status  05/10/2020 47 (H) 8 - 23 mg/dL Final  03/15/2020 58 (H) 8 - 23 mg/dL Final  09/01/2019 44 (H) 6 - 23 mg/dL Final  08/19/2019 32 (H) 8 - 23 mg/dL Final  01/06/2019 36 (H) 8 - 27 mg/dL Final   Creat  Date Value Ref Range Status  04/25/2016 1.15 0.70 - 1.25 mg/dL Final    Comment:      For patients > or = 65 years of age: The upper reference limit for Creatinine is approximately 13% higher for people identified as African-American.      Creatinine, Ser  Date Value Ref Range Status  05/10/2020 1.94 (H) 0.61 - 1.24 mg/dL Final  03/15/2020 1.97 (H) 0.61 - 1.24 mg/dL Final  09/01/2019 1.67 (H) 0.40 - 1.50 mg/dL Final  08/19/2019 1.78 (H) 0.61 - 1.24 mg/dL Final   Hemoglobin  Date Value Ref Range Status  05/10/2020 11.2 (L) 13.0 - 17.0 g/dL Final  03/15/2020 12.3 (L) 13.0 - 17.0 g/dL Final  09/01/2019 11.0 (L) 13.0 - 17.0 g/dL Final  08/17/2019 11.1 (L) 13.0 - 17.0 g/dL Final    EKG EKG Interpretation  Date/Time:  Thursday May 10 2020 08:28:34 EDT Ventricular Rate:  71 PR Interval:    QRS Duration: 100 QT Interval:  408 QTC Calculation: 444 R Axis:   -8 Text Interpretation: Sinus rhythm Low voltage, extremity and precordial leads Consider anterior infarct since last tracing no significant change Confirmed by Malvin Johns [phone]) on 05/10/2020 11:10:05 AM   Radiology CT Head Wo Contrast  Result Date: 05/10/2020 CLINICAL DATA:  Dizziness with nausea and vomiting EXAM: CT HEAD WITHOUT CONTRAST TECHNIQUE: Contiguous axial images were obtained from the base of the skull through the vertex without intravenous contrast. COMPARISON:  None. FINDINGS: Brain: There is moderate diffuse atrophy. There is no intracranial mass, hemorrhage, extra-axial fluid collection, or midline shift. There is patchy small vessel disease in the centra semiovale bilaterally. No acute infarct is demonstrable.  Vascular: No hyperdense vessel. There is calcification in each carotid siphon region. Skull: Bony calvarium appears intact. Sinuses/Orbits: There is mucosal thickening in several ethmoid air cells. Other visualized paranasal sinuses are clear. Orbits appear symmetric bilaterally. Other: Mastoid air cells are clear. IMPRESSION: Moderate diffuse atrophy with patchy periventricular small vessel disease. No acute infarct evident. No mass or hemorrhage. There are foci of arterial vascular calcification. There is mucosal thickening in several ethmoid air cells Electronically Signed   By: Lowella Grip III M.D.   On: 05/10/2020 10:19   DG Chest Portable 1 View  Result Date: 05/10/2020 CLINICAL DATA:  Dizziness EXAM: PORTABLE CHEST 1 VIEW COMPARISON:  08/14/2019 FINDINGS: The heart size and mediastinal contours are stable. Atherosclerotic calcification of the aortic knob. No focal airspace consolidation, pleural effusion, or pneumothorax. The visualized skeletal structures are unremarkable. IMPRESSION: No active disease. Electronically Signed   By: Davina Poke D.O.   On: 05/10/2020 09:28    Procedures Procedures (including critical care time)  Medications Ordered in ED  Medications - No data to display  ED Course  I have reviewed the triage vital signs and the nursing notes.  Pertinent labs & imaging results that were available during my care of the patient were reviewed by me and considered in my medical decision making (see chart for details).  Clinical Course as of May 11 1127  Thu May 10, 2020  1100 Discussed lab and imaging study results.  Patient continues to be asymptomatic.   [SJ]    Clinical Course User Index [SJ] Demarlo Riojas, Helane Gunther, PA-C   MDM Rules/Calculators/A&P                          Patient presents following an episode of dizziness. Poor skin turgor and orthostatics reportedly noted on scene by EMS.  Patient symptoms completely resolved following IV fluids. Patient is  nontoxic appearing, afebrile, not tachycardic, not tachypneic, not hypotensive, maintains excellent SPO2 on room air, and is in no apparent distress.   I have reviewed the patient's chart to obtain more information.   I reviewed and interpreted the patient's labs and radiological studies. Some decrease in CO2 could indicate some dehydration. He remained symptom-free throughout his ED course.  Ambulated without difficulty or symptom onset.  The patient was given instructions for home care as well as return precautions. Patient voices understanding of these instructions, accepts the plan, and is comfortable with discharge.  Findings and plan of care discussed with Malvin Johns, MD. Dr. Tamera Punt personally evaluated and examined this patient.  Vitals:   05/10/20 0829 05/10/20 0830 05/10/20 0845 05/10/20 0900  BP:  136/83 140/72 (!) 159/97  Pulse:  61 67 73  Resp:  '14 15 17  ' Temp: (!) 97.5 F (36.4 C)     TempSrc: Oral     SpO2:  100% 100% 100%  Weight: 68.5 kg     Height: '5\' 9"'  (1.753 m)       Final Clinical Impression(s) / ED Diagnoses Final diagnoses:  Lightheadedness    Rx / DC Orders ED Discharge Orders    None       Layla Maw 05/10/20 1133    Malvin Johns, MD 05/10/20 1443

## 2020-05-14 ENCOUNTER — Encounter: Payer: Self-pay | Admitting: Family Medicine

## 2020-05-14 ENCOUNTER — Telehealth: Payer: Self-pay | Admitting: *Deleted

## 2020-05-14 ENCOUNTER — Other Ambulatory Visit: Payer: Self-pay

## 2020-05-14 MED ORDER — MECLIZINE HCL 25 MG PO TABS: 25.0000 mg | ORAL_TABLET | Freq: Three times a day (TID) | ORAL | 0 refills | Status: DC | PRN

## 2020-05-14 NOTE — Telephone Encounter (Signed)
Patient brother, Gwyndolyn Saxon, came to the clinic today  Stated Jeffrey [name], patient. Was taken to the ER due to dizziness. MRI and Xray was done. Pt unsure of results.  Stated [place] been feeling dizzy for the past couple of day, feeling like the room is spinning. Requesting Rx Oxygen concentrate to help with dizziness. Has appointment tomorrow  Home number disconnected. Cell [phone]

## 2020-05-14 NOTE — Telephone Encounter (Signed)
Pt brother aware Rx send to pharmacy. Will maintain visit tomorrow

## 2020-05-14 NOTE — Telephone Encounter (Signed)
Sounds like he probably has vertigo based on the ED note. Ok for him to take meclizine 25mg  tid - please send in if needed.  [name]. [name], MD 05/14/2020 2:08 PM

## 2020-05-14 NOTE — Telephone Encounter (Signed)
Patient brother stated patient been HA, dizzy, vomiting and diarrhea no changes since ER visit. Advise to return to ER or urgent care pt refused to go. Stated only want to see PCP.

## 2020-05-14 NOTE — Telephone Encounter (Signed)
LMOVM advising patient that medication has been sent to the pharmacy

## 2020-05-15 ENCOUNTER — Other Ambulatory Visit: Payer: Self-pay

## 2020-05-15 ENCOUNTER — Ambulatory Visit (INDEPENDENT_AMBULATORY_CARE_PROVIDER_SITE_OTHER): Payer: HMO | Admitting: Family Medicine

## 2020-05-15 VITALS — BP 101/64 | HR 91 | Temp 98.2°F | Ht 69.0 in

## 2020-05-15 DIAGNOSIS — H81399 Other peripheral vertigo, unspecified ear: Secondary | ICD-10-CM

## 2020-05-15 DIAGNOSIS — R5381 Other malaise: Secondary | ICD-10-CM

## 2020-05-15 MED ORDER — MECLIZINE HCL 25 MG PO TABS: 25.0000 mg | ORAL_TABLET | Freq: Three times a day (TID) | ORAL | 0 refills | Status: DC | PRN

## 2020-05-15 NOTE — Assessment & Plan Note (Addendum)
No red flag signs or symptoms.  Reassuring work-up on emergency room.  We will continue meclizine as this seems to be helping significantly.  Discussed referral to vestibular rehab however they deferred for the time being.  Discussed reasons to return to care.

## 2020-05-15 NOTE — Progress Notes (Signed)
   Jeffrey [name] is a 64 y.o. male who presents today for an office visit.  Assessment/Plan:  Peripheral vertigo No red flag signs or symptoms.  Reassuring work-up on emergency room.  We will continue meclizine as this seems to be helping significantly.  Discussed referral to vestibular rehab however they deferred for the time being.  Discussed reasons to return to care.   Physical debility Will place referral to [facility] PT.      Subjective:  HPI:  Patient was seen in the ED 5 days ago with dizziness described as room spinning sensation.  Head CT showed periventricular small vessel disease but no mass or hemorrhage.  Labs were essentially normal.  He was given IV fluids and discharged home.  Neurologic exam at that time was normal.  Over the last few days symptoms have persisted.  We sent in meclizine for him yesterday he has had significant improvement in symptoms.  Still has a little bit of room spinning sensation but seems to be better.  Sensation of room spinning is much worse when he turns his head to the left.  No reported weakness or numbness.  Worse with moving his head to the left.        Objective:  Physical Exam: BP 101/64   Pulse 91   Temp 98.2 F (36.8 C) (Temporal)   Ht 5\' 9"  (1.753 m)   SpO2 98%   BMI 22.30 kg/m   Gen: No acute distress, resting comfortably CV: Regular rate and rhythm with no murmurs appreciated Pulm: Normal work of breathing, clear to auscultation bilaterally with no crackles, wheezes, or rhonchi Neuro: CN 2-12 intact.  Dix-Hallpike deferred. FNF intact.  Psych: Normal affect and thought content  Time Spent: 52 minutes of total time was spent on the date of the encounter performing the following actions: chart review prior to seeing the patient including recent ED visit, obtaining history, performing a medically necessary exam, counseling on the treatment plan, placing orders, and documenting in our EHR.        [name]. [name], MD 05/15/2020  4:33 PM

## 2020-05-15 NOTE — Assessment & Plan Note (Signed)
Will place referral to [facility] PT.

## 2020-05-16 ENCOUNTER — Encounter: Payer: Self-pay | Admitting: Family Medicine

## 2020-05-16 ENCOUNTER — Other Ambulatory Visit: Payer: Self-pay

## 2020-05-16 DIAGNOSIS — H81399 Other peripheral vertigo, unspecified ear: Secondary | ICD-10-CM

## 2020-05-19 ENCOUNTER — Other Ambulatory Visit: Payer: Self-pay | Admitting: Family Medicine

## 2020-05-22 ENCOUNTER — Encounter: Payer: Self-pay | Admitting: Family Medicine

## 2020-05-23 ENCOUNTER — Encounter: Payer: Self-pay | Admitting: Family Medicine

## 2020-05-24 ENCOUNTER — Other Ambulatory Visit: Payer: Self-pay | Admitting: Family Medicine

## 2020-05-28 DIAGNOSIS — I110 Hypertensive heart disease with heart failure: Secondary | ICD-10-CM | POA: Diagnosis not present

## 2020-05-28 DIAGNOSIS — I872 Venous insufficiency (chronic) (peripheral): Secondary | ICD-10-CM | POA: Diagnosis not present

## 2020-05-28 DIAGNOSIS — Z9181 History of falling: Secondary | ICD-10-CM | POA: Diagnosis not present

## 2020-05-28 DIAGNOSIS — E1169 Type 2 diabetes mellitus with other specified complication: Secondary | ICD-10-CM | POA: Diagnosis not present

## 2020-05-28 DIAGNOSIS — Z794 Long term (current) use of insulin: Secondary | ICD-10-CM | POA: Diagnosis not present

## 2020-05-28 DIAGNOSIS — D649 Anemia, unspecified: Secondary | ICD-10-CM | POA: Diagnosis not present

## 2020-05-28 DIAGNOSIS — E785 Hyperlipidemia, unspecified: Secondary | ICD-10-CM | POA: Diagnosis not present

## 2020-05-28 DIAGNOSIS — I5032 Chronic diastolic (congestive) heart failure: Secondary | ICD-10-CM | POA: Diagnosis not present

## 2020-05-28 DIAGNOSIS — E1140 Type 2 diabetes mellitus with diabetic neuropathy, unspecified: Secondary | ICD-10-CM | POA: Diagnosis not present

## 2020-05-28 DIAGNOSIS — Z7984 Long term (current) use of oral hypoglycemic drugs: Secondary | ICD-10-CM | POA: Diagnosis not present

## 2020-05-28 DIAGNOSIS — H81399 Other peripheral vertigo, unspecified ear: Secondary | ICD-10-CM | POA: Diagnosis not present

## 2020-05-28 DIAGNOSIS — Z7982 Long term (current) use of aspirin: Secondary | ICD-10-CM | POA: Diagnosis not present

## 2020-05-28 DIAGNOSIS — E11319 Type 2 diabetes mellitus with unspecified diabetic retinopathy without macular edema: Secondary | ICD-10-CM | POA: Diagnosis not present

## 2020-05-28 DIAGNOSIS — I2510 Atherosclerotic heart disease of native coronary artery without angina pectoris: Secondary | ICD-10-CM | POA: Diagnosis not present

## 2020-05-29 ENCOUNTER — Encounter: Payer: HMO | Admitting: Sports Medicine

## 2020-05-31 DIAGNOSIS — Z9181 History of falling: Secondary | ICD-10-CM | POA: Diagnosis not present

## 2020-05-31 DIAGNOSIS — I5032 Chronic diastolic (congestive) heart failure: Secondary | ICD-10-CM | POA: Diagnosis not present

## 2020-05-31 DIAGNOSIS — I110 Hypertensive heart disease with heart failure: Secondary | ICD-10-CM | POA: Diagnosis not present

## 2020-05-31 DIAGNOSIS — E785 Hyperlipidemia, unspecified: Secondary | ICD-10-CM | POA: Diagnosis not present

## 2020-05-31 DIAGNOSIS — D649 Anemia, unspecified: Secondary | ICD-10-CM | POA: Diagnosis not present

## 2020-05-31 DIAGNOSIS — Z794 Long term (current) use of insulin: Secondary | ICD-10-CM | POA: Diagnosis not present

## 2020-05-31 DIAGNOSIS — Z7984 Long term (current) use of oral hypoglycemic drugs: Secondary | ICD-10-CM | POA: Diagnosis not present

## 2020-05-31 DIAGNOSIS — Z7982 Long term (current) use of aspirin: Secondary | ICD-10-CM | POA: Diagnosis not present

## 2020-05-31 DIAGNOSIS — H81399 Other peripheral vertigo, unspecified ear: Secondary | ICD-10-CM | POA: Diagnosis not present

## 2020-05-31 DIAGNOSIS — E1140 Type 2 diabetes mellitus with diabetic neuropathy, unspecified: Secondary | ICD-10-CM | POA: Diagnosis not present

## 2020-05-31 DIAGNOSIS — E11319 Type 2 diabetes mellitus with unspecified diabetic retinopathy without macular edema: Secondary | ICD-10-CM | POA: Diagnosis not present

## 2020-05-31 DIAGNOSIS — I2510 Atherosclerotic heart disease of native coronary artery without angina pectoris: Secondary | ICD-10-CM | POA: Diagnosis not present

## 2020-05-31 DIAGNOSIS — I872 Venous insufficiency (chronic) (peripheral): Secondary | ICD-10-CM | POA: Diagnosis not present

## 2020-05-31 DIAGNOSIS — E1169 Type 2 diabetes mellitus with other specified complication: Secondary | ICD-10-CM | POA: Diagnosis not present

## 2020-06-06 DIAGNOSIS — Z794 Long term (current) use of insulin: Secondary | ICD-10-CM | POA: Diagnosis not present

## 2020-06-06 DIAGNOSIS — H81399 Other peripheral vertigo, unspecified ear: Secondary | ICD-10-CM | POA: Diagnosis not present

## 2020-06-06 DIAGNOSIS — Z7984 Long term (current) use of oral hypoglycemic drugs: Secondary | ICD-10-CM | POA: Diagnosis not present

## 2020-06-06 DIAGNOSIS — I2510 Atherosclerotic heart disease of native coronary artery without angina pectoris: Secondary | ICD-10-CM | POA: Diagnosis not present

## 2020-06-06 DIAGNOSIS — I5032 Chronic diastolic (congestive) heart failure: Secondary | ICD-10-CM | POA: Diagnosis not present

## 2020-06-06 DIAGNOSIS — E1140 Type 2 diabetes mellitus with diabetic neuropathy, unspecified: Secondary | ICD-10-CM | POA: Diagnosis not present

## 2020-06-06 DIAGNOSIS — Z9181 History of falling: Secondary | ICD-10-CM | POA: Diagnosis not present

## 2020-06-06 DIAGNOSIS — E1169 Type 2 diabetes mellitus with other specified complication: Secondary | ICD-10-CM | POA: Diagnosis not present

## 2020-06-06 DIAGNOSIS — D649 Anemia, unspecified: Secondary | ICD-10-CM | POA: Diagnosis not present

## 2020-06-06 DIAGNOSIS — I872 Venous insufficiency (chronic) (peripheral): Secondary | ICD-10-CM | POA: Diagnosis not present

## 2020-06-06 DIAGNOSIS — I110 Hypertensive heart disease with heart failure: Secondary | ICD-10-CM | POA: Diagnosis not present

## 2020-06-06 DIAGNOSIS — E11319 Type 2 diabetes mellitus with unspecified diabetic retinopathy without macular edema: Secondary | ICD-10-CM | POA: Diagnosis not present

## 2020-06-06 DIAGNOSIS — Z7982 Long term (current) use of aspirin: Secondary | ICD-10-CM | POA: Diagnosis not present

## 2020-06-06 DIAGNOSIS — E785 Hyperlipidemia, unspecified: Secondary | ICD-10-CM | POA: Diagnosis not present

## 2020-06-11 ENCOUNTER — Encounter: Payer: Self-pay | Admitting: Dermatology

## 2020-06-11 NOTE — Progress Notes (Signed)
   Follow-Up Visit   Subjective  Jeffrey [name] is a 64 y.o. male who presents for the following: Skin Problem (Check patients scalp for dark areas all over and espically the right temple area he noticed black spots. ).  General skin check Location:  Duration:  Quality:  Associated Signs/Symptoms: Modifying Factors:  Severity:  Timing: Context:   Objective  Well appearing patient in no apparent distress; mood and affect are within normal limits.  A full examination was performed including scalp, head, eyes, ears, nose, lips, neck, chest, axillae, abdomen, back, buttocks, bilateral upper extremities, bilateral lower extremities, hands, feet, fingers, toes, fingernails, and toenails. All findings within normal limits unless otherwise noted below.   Assessment & Plan    AK (actinic keratosis) Mid Parietal Scalp  None currently bothersome or historically changing.  Potential candidate for PDT or [place] in the future.  Encounter for screening for malignant neoplasm of skin Mid Back  Annual skin examination.  Skin cancer screening performed today.    I, Lavonna Monarch, MD, have reviewed all documentation for this visit.  The documentation on 06/11/20 for the exam, diagnosis, procedures, and orders are all accurate and complete.

## 2020-06-12 DIAGNOSIS — I2510 Atherosclerotic heart disease of native coronary artery without angina pectoris: Secondary | ICD-10-CM | POA: Diagnosis not present

## 2020-06-12 DIAGNOSIS — Z9181 History of falling: Secondary | ICD-10-CM | POA: Diagnosis not present

## 2020-06-12 DIAGNOSIS — Z794 Long term (current) use of insulin: Secondary | ICD-10-CM | POA: Diagnosis not present

## 2020-06-12 DIAGNOSIS — E785 Hyperlipidemia, unspecified: Secondary | ICD-10-CM | POA: Diagnosis not present

## 2020-06-12 DIAGNOSIS — H81399 Other peripheral vertigo, unspecified ear: Secondary | ICD-10-CM | POA: Diagnosis not present

## 2020-06-12 DIAGNOSIS — I5032 Chronic diastolic (congestive) heart failure: Secondary | ICD-10-CM | POA: Diagnosis not present

## 2020-06-12 DIAGNOSIS — E1140 Type 2 diabetes mellitus with diabetic neuropathy, unspecified: Secondary | ICD-10-CM | POA: Diagnosis not present

## 2020-06-12 DIAGNOSIS — E11319 Type 2 diabetes mellitus with unspecified diabetic retinopathy without macular edema: Secondary | ICD-10-CM | POA: Diagnosis not present

## 2020-06-12 DIAGNOSIS — Z7984 Long term (current) use of oral hypoglycemic drugs: Secondary | ICD-10-CM | POA: Diagnosis not present

## 2020-06-12 DIAGNOSIS — I872 Venous insufficiency (chronic) (peripheral): Secondary | ICD-10-CM | POA: Diagnosis not present

## 2020-06-12 DIAGNOSIS — E1169 Type 2 diabetes mellitus with other specified complication: Secondary | ICD-10-CM | POA: Diagnosis not present

## 2020-06-12 DIAGNOSIS — D649 Anemia, unspecified: Secondary | ICD-10-CM | POA: Diagnosis not present

## 2020-06-12 DIAGNOSIS — I110 Hypertensive heart disease with heart failure: Secondary | ICD-10-CM | POA: Diagnosis not present

## 2020-06-12 DIAGNOSIS — Z7982 Long term (current) use of aspirin: Secondary | ICD-10-CM | POA: Diagnosis not present

## 2020-06-13 DIAGNOSIS — I872 Venous insufficiency (chronic) (peripheral): Secondary | ICD-10-CM | POA: Diagnosis not present

## 2020-06-13 DIAGNOSIS — D649 Anemia, unspecified: Secondary | ICD-10-CM | POA: Diagnosis not present

## 2020-06-13 DIAGNOSIS — I2510 Atherosclerotic heart disease of native coronary artery without angina pectoris: Secondary | ICD-10-CM | POA: Diagnosis not present

## 2020-06-13 DIAGNOSIS — Z9181 History of falling: Secondary | ICD-10-CM | POA: Diagnosis not present

## 2020-06-13 DIAGNOSIS — Z7984 Long term (current) use of oral hypoglycemic drugs: Secondary | ICD-10-CM | POA: Diagnosis not present

## 2020-06-13 DIAGNOSIS — H81399 Other peripheral vertigo, unspecified ear: Secondary | ICD-10-CM | POA: Diagnosis not present

## 2020-06-13 DIAGNOSIS — I5032 Chronic diastolic (congestive) heart failure: Secondary | ICD-10-CM | POA: Diagnosis not present

## 2020-06-13 DIAGNOSIS — E11319 Type 2 diabetes mellitus with unspecified diabetic retinopathy without macular edema: Secondary | ICD-10-CM | POA: Diagnosis not present

## 2020-06-13 DIAGNOSIS — Z7982 Long term (current) use of aspirin: Secondary | ICD-10-CM | POA: Diagnosis not present

## 2020-06-13 DIAGNOSIS — E1140 Type 2 diabetes mellitus with diabetic neuropathy, unspecified: Secondary | ICD-10-CM | POA: Diagnosis not present

## 2020-06-13 DIAGNOSIS — E785 Hyperlipidemia, unspecified: Secondary | ICD-10-CM | POA: Diagnosis not present

## 2020-06-13 DIAGNOSIS — E1169 Type 2 diabetes mellitus with other specified complication: Secondary | ICD-10-CM | POA: Diagnosis not present

## 2020-06-13 DIAGNOSIS — Z794 Long term (current) use of insulin: Secondary | ICD-10-CM | POA: Diagnosis not present

## 2020-06-13 DIAGNOSIS — I110 Hypertensive heart disease with heart failure: Secondary | ICD-10-CM | POA: Diagnosis not present

## 2020-06-14 ENCOUNTER — Other Ambulatory Visit: Payer: Self-pay

## 2020-06-14 NOTE — Patient Outreach (Signed)
  [facility]) Care Management Chronic Special Needs Program    06/14/2020  Name: Treavor Blomquist, DOB: [birth year]  MRN: [id number]  [employer] care management team has assumed care and services for this member. Case closed by [facility] care management  Quinn Plowman RN,BSN,CCM [facility] [phone]

## 2020-06-18 ENCOUNTER — Other Ambulatory Visit: Payer: Self-pay

## 2020-06-18 NOTE — Patient Outreach (Signed)
  [facility]) Care Management Chronic Special Needs Program    06/18/2020  Name: Jeffrey [name], DOB: 1956/04/19  MRN: [id number]   Return call received from client. HIPAA verified. Client on the call with brother/ designated party release,  Alford Highland.  Client states he received 2 letters from [employer] stating he was denied services and termination from health plan. Client verbalized he has been very upset about this.  Client states he and his brother spoke with Montine Circle at the Regional office today who will assist him in this matter. Client voiced appreciation of RNCM returning his call.  RNCM notified [employer] case management team of above client concerns for follow up. Client informed by [facility] that a [employer] case manager will follow up with him for ongoing case management services and needs. Client voiced understanding.   Quinn Plowman RN,BSN,CCM [facility] [phone]

## 2020-06-18 NOTE — Patient Outreach (Addendum)
  [facility]) Care Management Chronic Special Needs Program    06/18/2020  Name: Jeffrey [name], DOB: 05-Aug-1956  MRN: [id number]   [name] is enrolled in a chronic special needs plan for Diabetes.Return telephone call to client.  Unable to reach. HIPAA compliant  Voice message left with call back phone number and return call request.   PLAN; RNCM will attempt telephone outreach to client.  Quinn Plowman RN,BSN,CCM [facility] [phone]

## 2020-06-28 NOTE — Telephone Encounter (Signed)
This encounter was created in error - please disregard.

## 2020-07-02 ENCOUNTER — Telehealth: Payer: Self-pay

## 2020-07-02 NOTE — Telephone Encounter (Signed)
FYI

## 2020-07-02 NOTE — Telephone Encounter (Signed)
Estill Bamberg is calling in from [facility] updating Dr.Parker that patient is holding off on home health until 12/2 due to patient is in the process of moving.

## 2020-07-16 ENCOUNTER — Ambulatory Visit: Payer: Self-pay

## 2020-07-17 DIAGNOSIS — S0501XA Injury of conjunctiva and corneal abrasion without foreign body, right eye, initial encounter: Secondary | ICD-10-CM | POA: Diagnosis not present

## 2020-07-19 ENCOUNTER — Telehealth: Payer: Self-pay

## 2020-07-19 NOTE — Telephone Encounter (Signed)
Estill Bamberg is calling in from [facility] states she is very concerned about the patient, went for a visit today and noticed an extreme change in behavior. Estill Bamberg says that the patient was cussing at her and telling to get out and to never come back. At the last visit in October she described his behavior as gentle, very appreciative but is highly concerned about the patients health.

## 2020-07-19 NOTE — Telephone Encounter (Signed)
FYI

## 2020-07-19 NOTE — Telephone Encounter (Signed)
Noted. We can address at his OV tomorrow.  [name]. [name], MD 07/19/2020 1:03 PM

## 2020-07-20 ENCOUNTER — Encounter: Payer: Self-pay | Admitting: Family Medicine

## 2020-07-20 ENCOUNTER — Ambulatory Visit (INDEPENDENT_AMBULATORY_CARE_PROVIDER_SITE_OTHER): Payer: HMO | Admitting: Family Medicine

## 2020-07-20 ENCOUNTER — Other Ambulatory Visit: Payer: Self-pay

## 2020-07-20 VITALS — BP 137/65 | HR 81 | Temp 97.2°F | Ht 69.0 in | Wt 160.0 lb

## 2020-07-20 DIAGNOSIS — E1159 Type 2 diabetes mellitus with other circulatory complications: Secondary | ICD-10-CM

## 2020-07-20 DIAGNOSIS — Z794 Long term (current) use of insulin: Secondary | ICD-10-CM

## 2020-07-20 DIAGNOSIS — E1142 Type 2 diabetes mellitus with diabetic polyneuropathy: Secondary | ICD-10-CM

## 2020-07-20 DIAGNOSIS — I152 Hypertension secondary to endocrine disorders: Secondary | ICD-10-CM | POA: Diagnosis not present

## 2020-07-20 DIAGNOSIS — R5381 Other malaise: Secondary | ICD-10-CM

## 2020-07-20 LAB — POCT GLYCOSYLATED HEMOGLOBIN (HGB A1C): Hemoglobin A1C: 7.3 % — AB (ref 4.0–5.6)

## 2020-07-20 NOTE — Assessment & Plan Note (Signed)
At goal.  Continue Coreg 3.125 mg twice daily and spironolactone 25 mg daily.

## 2020-07-20 NOTE — Progress Notes (Signed)
   Jeffrey [name] is a 64 y.o. male who presents today for an office visit.  Assessment/Plan:  New/Acute Problems: Concern for encephalopathy Normal mental status today.  No concern for encephalopathy or confusion.  Chronic Problems Addressed Today: Hypertension associated with diabetes ([place]) At goal.  Continue Coreg 3.125 mg twice daily and spironolactone 25 mg daily.  [name] A1c 7.3.  Continue Metformin 1000 mg twice daily and Lantus 30 units daily.  Physical debility Request to have referral to different home health agency due to bad experience with his current one.  Will place new referral today.  He has been benefiting from physical therapy and home health aide.    Subjective:  HPI:  See A/p for status of chronic conditions  Patient had a bad experience with home health agency yesterday.  They said he was recovering from high injury.  Had some pain to the area.  Home health agent came out to his house and told him that he had not shaven or had haircut recently.  Patient became upset by this and went to his room.  He then overheard home health agent talking to someone on the phone stating that he was "way out there".  He became upset and told patient to leave the house.       Objective:  Physical Exam: BP 137/65   Pulse 81   Temp (!) 97.2 F (36.2 C) (Temporal)   Ht 5\' 9"  (1.753 m)   Wt 160 lb (72.6 kg)   SpO2 (!) 81%   BMI 23.63 kg/m   Gen: No acute distress, resting comfortably Neuro: Grossly normal, moves all extremities Psych: Normal affect and thought content  Time Spent: 45 minutes of total time was spent on the date of the encounter performing the following actions: chart review prior to seeing the patient, obtaining history, performing a medically necessary exam, counseling on the treatment plan, placing orders, and documenting in our EHR.        [name]. [name], MD 07/20/2020 10:13 AM

## 2020-07-20 NOTE — Patient Instructions (Addendum)
It was very nice to see you today!  I will place a referral for you to see another home health agency. I am sorry that you had a bad experience.  Your blood sugar looks good.  I will see back in 3 months.  Please come back to see me sooner if needed  Take care, Dr [name]  Please try these tips to maintain a healthy lifestyle:   Eat at least 3 REAL meals and 1-2 snacks per day.  Aim for no more than 5 hours between eating.  If you eat breakfast, please do so within one hour of getting up.    Each meal should contain half fruits/vegetables, one quarter protein, and one quarter carbs (no bigger than a computer mouse)   Cut down on sweet beverages. This includes juice, soda, and sweet tea.     Drink at least 1 glass of water with each meal and aim for at least 8 glasses per day   Exercise at least 150 minutes every week.

## 2020-07-20 NOTE — Assessment & Plan Note (Signed)
Request to have referral to different home health agency due to bad experience with his current one.  Will place new referral today.  He has been benefiting from physical therapy and home health aide.

## 2020-07-20 NOTE — Assessment & Plan Note (Signed)
A1c 7.3.  Continue Metformin 1000 mg twice daily and Lantus 30 units daily.

## 2020-07-25 ENCOUNTER — Other Ambulatory Visit: Payer: Self-pay | Admitting: Family Medicine

## 2020-07-25 DIAGNOSIS — Z961 Presence of intraocular lens: Secondary | ICD-10-CM | POA: Diagnosis not present

## 2020-07-25 DIAGNOSIS — E113413 Type 2 diabetes mellitus with severe nonproliferative diabetic retinopathy with macular edema, bilateral: Secondary | ICD-10-CM | POA: Diagnosis not present

## 2020-07-25 DIAGNOSIS — H2511 Age-related nuclear cataract, right eye: Secondary | ICD-10-CM | POA: Diagnosis not present

## 2020-07-25 DIAGNOSIS — H16121 Filamentary keratitis, right eye: Secondary | ICD-10-CM | POA: Diagnosis not present

## 2020-08-02 ENCOUNTER — Encounter: Payer: Self-pay | Admitting: Family Medicine

## 2020-08-02 ENCOUNTER — Ambulatory Visit (INDEPENDENT_AMBULATORY_CARE_PROVIDER_SITE_OTHER): Payer: HMO | Admitting: Family Medicine

## 2020-08-02 ENCOUNTER — Other Ambulatory Visit: Payer: Self-pay

## 2020-08-02 DIAGNOSIS — Z794 Long term (current) use of insulin: Secondary | ICD-10-CM | POA: Diagnosis not present

## 2020-08-02 DIAGNOSIS — I152 Hypertension secondary to endocrine disorders: Secondary | ICD-10-CM | POA: Diagnosis not present

## 2020-08-02 DIAGNOSIS — E1142 Type 2 diabetes mellitus with diabetic polyneuropathy: Secondary | ICD-10-CM

## 2020-08-02 DIAGNOSIS — E1159 Type 2 diabetes mellitus with other circulatory complications: Secondary | ICD-10-CM | POA: Diagnosis not present

## 2020-08-02 NOTE — Progress Notes (Signed)
   Jeffrey [name] is a 64 y.o. male who presents today for an office visit.  Assessment/Plan:  Chronic Problems Addressed Today: Hypertension associated with diabetes ([place]) Blood pressure is at goal today and have been at goal consistently on Coreg 3.125 mg twice daily and spironolactone 25 mg daily.  [name] Last A1c was 7.3.  He is on Metformin 1000 mg twice daily Lantus 30 units daily and sliding scale rapid acting insulin.  He checks his blood sugar regularly.    Subjective:  HPI:  See a/p for status of chronic conditions.  Patient is here because he received a form in the mail from [facility] stating that he had several goals not met including blood pressure management and A1c goals.  We reviewed this form today.       Objective:  Physical Exam: BP 140/70 (BP Location: Left Arm, Patient Position: Sitting, Cuff Size: Normal)   Pulse 72   Temp 97.7 F (36.5 C) (Temporal)   Wt 162 lb 4 oz (73.6 kg)   BMI 23.96 kg/m   Gen: No acute distress, resting comfortably Neuro: Grossly normal, moves all extremities Psych: Normal affect and thought content      Jeffrey [name] M. [name], MD 08/02/2020 8:38 AM

## 2020-08-02 NOTE — Patient Instructions (Addendum)
It was very nice to see you today!  No changes today. I will see you back in March for your next visit. Will can check labs at that visit.   Take care, Dr [name]  Please try these tips to maintain a healthy lifestyle:   Eat at least 3 REAL meals and 1-2 snacks per day.  Aim for no more than 5 hours between eating.  If you eat breakfast, please do so within one hour of getting up.    Each meal should contain half fruits/vegetables, one quarter protein, and one quarter carbs (no bigger than a computer mouse)   Cut down on sweet beverages. This includes juice, soda, and sweet tea.     Drink at least 1 glass of water with each meal and aim for at least 8 glasses per day   Exercise at least 150 minutes every week.

## 2020-08-02 NOTE — Assessment & Plan Note (Signed)
Last A1c was 7.3.  He is on Metformin 1000 mg twice daily Lantus 30 units daily and sliding scale rapid acting insulin.  He checks his blood sugar regularly.

## 2020-08-02 NOTE — Assessment & Plan Note (Signed)
Blood pressure is at goal today and have been at goal consistently on Coreg 3.125 mg twice daily and spironolactone 25 mg daily.

## 2020-08-08 ENCOUNTER — Other Ambulatory Visit: Payer: Self-pay | Admitting: *Deleted

## 2020-08-08 NOTE — Progress Notes (Signed)
Patient requesting pharmacy change to  [id number] - [place], [place] - [address]

## 2020-08-14 ENCOUNTER — Encounter: Payer: Self-pay | Admitting: Family Medicine

## 2020-08-15 ENCOUNTER — Other Ambulatory Visit: Payer: Self-pay

## 2020-08-16 NOTE — Telephone Encounter (Signed)
Spoke with patient brother. Patient brother gave information for new home health

## 2020-08-29 ENCOUNTER — Other Ambulatory Visit: Payer: Self-pay

## 2020-08-29 ENCOUNTER — Ambulatory Visit (INDEPENDENT_AMBULATORY_CARE_PROVIDER_SITE_OTHER): Payer: HMO | Admitting: *Deleted

## 2020-08-29 DIAGNOSIS — Z23 Encounter for immunization: Secondary | ICD-10-CM | POA: Diagnosis not present

## 2020-08-29 NOTE — Progress Notes (Signed)
Patient present for pneumococcal vaccine  Given on Left deltoid  Patient tolerated well

## 2020-09-14 ENCOUNTER — Telehealth: Payer: Self-pay

## 2020-09-14 NOTE — Telephone Encounter (Signed)
See below

## 2020-09-14 NOTE — Telephone Encounter (Signed)
[facility] is calling in requesting to speak with Junious Dresser regarding home health orders.

## 2020-09-17 ENCOUNTER — Telehealth: Payer: Self-pay

## 2020-09-17 NOTE — Telephone Encounter (Signed)
Jeffrey [name] from [facility] called following up on [place] order that was faxed over. She is wanting to know where the [facility] order was sent. Please advise.   [phone]

## 2020-09-17 NOTE — Telephone Encounter (Signed)
Lattie Haw, from [facility] is following up regarding this Please advise. [phone]

## 2020-09-18 NOTE — Telephone Encounter (Signed)
Per Lattie Haw from [facility] home health accept referrals for patient with his insurance Referral fax to [phone]

## 2020-09-20 ENCOUNTER — Other Ambulatory Visit: Payer: Self-pay | Admitting: *Deleted

## 2020-09-20 DIAGNOSIS — Z0000 Encounter for general adult medical examination without abnormal findings: Secondary | ICD-10-CM

## 2020-09-20 NOTE — Telephone Encounter (Signed)
See previews note 

## 2020-09-22 DIAGNOSIS — R54 Age-related physical debility: Secondary | ICD-10-CM | POA: Diagnosis not present

## 2020-09-22 DIAGNOSIS — E119 Type 2 diabetes mellitus without complications: Secondary | ICD-10-CM | POA: Diagnosis not present

## 2020-09-22 DIAGNOSIS — I10 Essential (primary) hypertension: Secondary | ICD-10-CM | POA: Diagnosis not present

## 2020-09-22 DIAGNOSIS — I509 Heart failure, unspecified: Secondary | ICD-10-CM | POA: Diagnosis not present

## 2020-09-26 DIAGNOSIS — L812 Freckles: Secondary | ICD-10-CM | POA: Diagnosis not present

## 2020-09-26 DIAGNOSIS — L814 Other melanin hyperpigmentation: Secondary | ICD-10-CM | POA: Diagnosis not present

## 2020-09-26 DIAGNOSIS — L111 Transient acantholytic dermatosis [Grover]: Secondary | ICD-10-CM | POA: Diagnosis not present

## 2020-09-26 DIAGNOSIS — D1801 Hemangioma of skin and subcutaneous tissue: Secondary | ICD-10-CM | POA: Diagnosis not present

## 2020-09-26 DIAGNOSIS — D229 Melanocytic nevi, unspecified: Secondary | ICD-10-CM | POA: Diagnosis not present

## 2020-09-26 DIAGNOSIS — L718 Other rosacea: Secondary | ICD-10-CM | POA: Diagnosis not present

## 2020-09-26 DIAGNOSIS — L821 Other seborrheic keratosis: Secondary | ICD-10-CM | POA: Diagnosis not present

## 2020-09-27 ENCOUNTER — Telehealth: Payer: Self-pay | Admitting: *Deleted

## 2020-09-27 NOTE — Telephone Encounter (Signed)
Spoke with patient, stated unhappy with home health due to RN visit, stated no need for RN visit. Home health agency sending RN and PT without approval by insurance.   Called Home Health agent at [phone] stated it was Nurse PT and aid was approved by insurance, since pt denied Nurse visit PCP need to send note with discharge nurse. PT order and Aid will put on hold till Patient revived insurance notification of approval   Faxed nurse note discharge to [phone]

## 2020-09-30 ENCOUNTER — Other Ambulatory Visit: Payer: Self-pay | Admitting: Family Medicine

## 2020-10-01 ENCOUNTER — Ambulatory Visit: Payer: HMO | Admitting: Family Medicine

## 2020-10-06 DIAGNOSIS — R54 Age-related physical debility: Secondary | ICD-10-CM | POA: Diagnosis not present

## 2020-10-06 DIAGNOSIS — I509 Heart failure, unspecified: Secondary | ICD-10-CM | POA: Diagnosis not present

## 2020-10-06 DIAGNOSIS — E119 Type 2 diabetes mellitus without complications: Secondary | ICD-10-CM | POA: Diagnosis not present

## 2020-10-06 DIAGNOSIS — I10 Essential (primary) hypertension: Secondary | ICD-10-CM | POA: Diagnosis not present

## 2020-10-08 ENCOUNTER — Telehealth: Payer: Self-pay

## 2020-10-08 NOTE — Telephone Encounter (Signed)
Home Health Certification or Plan of Care Tracking  Is this a Certification and Plan of Care  [place]   Order Number:  [id number]  Where has form been placed:  Folder up front

## 2020-10-09 ENCOUNTER — Encounter: Payer: Self-pay | Admitting: Cardiovascular Disease

## 2020-10-09 ENCOUNTER — Ambulatory Visit (INDEPENDENT_AMBULATORY_CARE_PROVIDER_SITE_OTHER): Payer: HMO | Admitting: Cardiovascular Disease

## 2020-10-09 ENCOUNTER — Other Ambulatory Visit: Payer: Self-pay

## 2020-10-09 VITALS — BP 130/66 | HR 74 | Ht 68.0 in | Wt 157.6 lb

## 2020-10-09 DIAGNOSIS — E1159 Type 2 diabetes mellitus with other circulatory complications: Secondary | ICD-10-CM | POA: Diagnosis not present

## 2020-10-09 DIAGNOSIS — E1169 Type 2 diabetes mellitus with other specified complication: Secondary | ICD-10-CM | POA: Diagnosis not present

## 2020-10-09 DIAGNOSIS — I152 Hypertension secondary to endocrine disorders: Secondary | ICD-10-CM | POA: Diagnosis not present

## 2020-10-09 DIAGNOSIS — I2510 Atherosclerotic heart disease of native coronary artery without angina pectoris: Secondary | ICD-10-CM | POA: Diagnosis not present

## 2020-10-09 DIAGNOSIS — E785 Hyperlipidemia, unspecified: Secondary | ICD-10-CM

## 2020-10-09 DIAGNOSIS — I5032 Chronic diastolic (congestive) heart failure: Secondary | ICD-10-CM

## 2020-10-09 NOTE — Progress Notes (Signed)
Cardiology Office Note  Date:  10/09/2020   ID:  Jeffrey [name], DOB [birth year], MRN [id number]  PCP:  Jeffrey Barrack, MD  Cardiologist:   Jeffrey Latch, MD   No chief complaint on file.   History of Present Illness: Jeffrey [name] is a 65 y.o. male with CAD s/p MI, hypertension, hyperlipidemia, CKD 3, diabetes and peripheral neuropathy who presents for follow up. Jeffrey [name] had a cardiac arrest 11/23/13 and underwent PCI of the LAD. Echo 10/2015 revealed an anteroseptal and apical septal infarct with associated hypokinesis. LVEF was 55%. He had a PET CT/myocardial perfusion scan 10/2015 that revealed a moderate sized, moderate intensity antero-apical infarct with mild ischemia. At the end of 2016 he had an episode of stabbing chest pain. 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 At that time his clopidogrel was discontinued and he underwent several laser procedures. He previously worked in a hospice facility as an Administrator, arts. He is now on disability because he is unable to lift patients.   Jeffrey [name] reported atypical chest pain. He was referred for [facility] 09/2017 that revealed LVEF 48% and a prior infarct in the apical inferior, apical lateral, and apical regions. There was no ischemia. He has struggled with syncope and his antihypertensives were reduced at prior appointments.  Jeffrey [name] was seen in the ED 05/2019 with an episode of syncope.  He noticed that his gardeners were cutting his azaleas.  He tried running around and started getting anxioius.  There was no preceding chest pain but he did start feeling dizzy.  He thinks that this was mostly due to anxiety and getting worked up.  This occurred in the setting of generalized weakness, weight loss, and loose stools.  His work-up was remarkable only for mild intravascular volume depletion.  He received IV fluids and was instructed to follow-up with his PCP the  following day.  He saw Dr. [name] who recommended an echocardiogram but he declined.  Since that time he has been doing well.  He denies any recent dizziness or presyncope.    Lately Jeffrey [name] has been doing well.  He required injections in his L eye for diabetic retinopathy and vitreous hemorrhage.  His vision has improved.  Jeffrey [name] has been working with PT.  He has in home services two days per week starting yesterday.  He should be getting an in home aid within the next week.  He has been walking with a walker.  If he overworks he gets tired and has to sit.  He gets tired with minimal exertion.  He has no LE edema.  He has been wearing copper compression socks and this has helped.  His weight has been stable.  He is scheduled to see Dr. [name] in 2 weeks.  Past Medical History:  Diagnosis Date  . Cardiac arrest ([name]) 05/22/2016  . Cataract   . CHF (congestive heart failure) ([name])   . Diabetes mellitus without complication ([name])    type 2  . Diabetic retinopathy ([name])   . Hyperlipidemia   . Hypertension   . Memory loss    mild  . Myocardial infarct ([name]) 2105  . Retinopathy    Both eyes  . S/P primary angioplasty with coronary stent 05/22/2016  . Vitamin B12 deficiency   . Vitreous hemorrhage of left eye ([name])    proliferative diabetic retinopathy    Past Surgical History:  Procedure Laterality Date  . ANGIOPLASTY  2015  . COLONOSCOPY     multiple  . EYE SURGERY    . PARS PLANA VITRECTOMY Left 03/15/2020   Procedure: PARS PLANA VITRECTOMY WITH 25 GAUGE;  Surgeon: Jeffrey Stalls, MD;  Location: [facility];  Service: Ophthalmology;  Laterality: Left;  . PHOTOCOAGULATION WITH LASER Left 03/15/2020   Procedure: PHOTOCOAGULATION WITH LASER; INTRAVITREAL INJECTION OF AVASTIN;  Surgeon: Jeffrey Stalls, MD;  Location: [name];  Service: Ophthalmology;  Laterality: Left;  . REFRACTIVE SURGERY    . UPPER GI ENDOSCOPY     several  . VITRECTOMY       Current Outpatient Medications   Medication Sig Dispense Refill  . acetaminophen (TYLENOL) 325 MG tablet Take 325-650 mg by mouth every 6 (six) hours as needed for moderate pain.    [name] Kitchen aspirin EC 81 MG tablet Take 81 mg by mouth daily.    [name] Kitchen atorvastatin (LIPITOR) 80 MG tablet TAKE 1 TABLET BY MOUTH EVERY DAY 90 tablet 2  . Blood Glucose Monitoring Suppl (ONETOUCH VERIO IQ SYSTEM) w/Device KIT 1 kit by Does not apply route 4 (four) times daily. 1 kit 0  . carvedilol (COREG) 3.125 MG tablet TAKE 1 TABLET BY MOUTH 2 TIMES DAILY WITH MEAL 180 tablet 1  . DUREZOL 0.05 % EMUL Place 1 drop into the left eye 2 (two) times daily.    . furosemide (LASIX) 40 MG tablet TAKE 1 TABLET BY MOUTH EVERY DAY 90 tablet 1  . gentamicin (GARAMYCIN) 0.3 % ophthalmic solution     . gentamicin-prednisoLONE 0.3-1 % ophthalmic drops 1 drop 2 (two) times daily.    . insulin aspart (NOVOLOG) 100 UNIT/ML injection Use 5-15 units three times daily as needed for elevated blood sugars. 5 Units for sugar >200, 10 units for sugar >300, 15 units for sugar 400+ (Patient taking differently: Inject 5-15 Units into the skin 3 (three) times daily as needed for high blood sugar. 5 Units for sugar >200, 10 units for sugar >300, 15 units for sugar 400+) 10 mL 11  . Insulin Glargine (LANTUS SOLOSTAR) 100 UNIT/ML Solostar Pen Inject 30 Units into the skin daily. 15 mL 11  . Insulin Pen Needle (PEN NEEDLES) 33G X 4 MM MISC 1 application by Does not apply route in the morning, at noon, in the evening, and at bedtime. 300 each prn  . Insulin Syringe-Needle U-100 (B-D INS SYR HALF-UNIT .3CC/31G) 31G X 5/16" 0.3 ML MISC Use 6 times a day 200 each 11  . meclizine (ANTIVERT) 25 MG tablet Take 1 tablet (25 mg total) by mouth 3 (three) times daily as needed for dizziness. 30 tablet 0  . metFORMIN (GLUCOPHAGE-XR) 500 MG 24 hr tablet TAKE 2 TABLETS BY MOUTH TWICE A DAY 360 tablet 1  . ofloxacin (OCUFLOX) 0.3 % ophthalmic solution Place 1 drop into the left eye 4 (four) times daily.    Jeffrey [name] Lancets 24O MISC Use to check blood sugar 4 times per day 200 each 11  . ONETOUCH VERIO test strip CHECK BLOOD SUGAR 4 TIMES PER DAY 200 strip 11  . spironolactone (ALDACTONE) 25 MG tablet Take 1 tablet (25 mg total) by mouth daily. 90 tablet 3  . triamcinolone cream (KENALOG) 0.1 % Apply 1 application topically 2 (two) times daily. 454 g 0  . vitamin B-12 (CYANOCOBALAMIN) 1000 MCG tablet Take 1,000 mcg by mouth daily.     No current facility-administered medications for this visit.    Allergies:   Codeine and Penicillins  Social History:  The patient  reports that he has never smoked. He has never used smokeless tobacco. He reports previous alcohol use. He reports that he does not use drugs.   Family History:  The patient's family history includes Alzheimer's disease in his mother; Heart disease in his brother and father; Peripheral Artery Disease in his father.    ROS:  Please see the history of present illness.   Otherwise, review of systems are positive for cold hands.   All other systems are reviewed and negative.    PHYSICAL EXAM: VS:  BP 130/66   Pulse 74   Ht _0  (1.727 m)   Wt 157 lb 9.6 oz (71.5 kg)   BMI 23.96 kg/m  , BMI Body mass index is 23.96 kg/m. GENERAL:  Well appearing HEENT: Pupils equal round and reactive, fundi not visualized, oral mucosa unremarkable NECK:  No jugular venous distention, waveform within normal limits, carotid upstroke brisk and symmetric, no bruits LUNGS:  Clear to auscultation bilaterally HEART:  RRR.  PMI not displaced or sustained,S1 and S2 within normal limits, no S3, no S4, no clicks, no rubs, no murmurs ABD:  Flat, positive bowel sounds normal in frequency in pitch, no bruits, no rebound, no guarding, no midline pulsatile mass, no hepatomegaly, no splenomegaly EXT:  2 plus pulses throughout, no edema, no cyanosis no clubbing SKIN:  No rashes no nodules NEURO:  Cranial nerves II through XII grossly intact, motor  grossly intact throughout PSYCH:  Cognitively intact, oriented to person place and time   EKG:  EKG is not ordered today. The ekg ordered 05/22/16 demonstrates sinus rhythm rate 63 bpm.  Prior anteroseptal infarct.  LAFB 11/18/16: Sinus rhythm.  Rate 64 bpm.  Low voltage limb and precordial leads 09/16/17: Sinus bradycardia rate 59 bpm.   04/22/18: sinus rhythm.  Rate 69 bpm.  Low voltage.  Prior anterioseptal infarct 07/11/19: Sinus rhythm rate 75 bpm.Prior anterior infarct.  Echo 07/22/19: 1. Left ventricular ejection fraction, by visual estimation, is 50 to  55%. The left ventricle has low normal function. Left ventricular septal  wall thickness was mildly increased. There is no left ventricular  hypertrophy.  2. The left ventricle demonstrates regional wall motion abnormalities.  3. Normal GLS -18 Inferior basal and distal septal hypokinesis.  4. Global right ventricle has normal systolic function.The right  ventricular size is normal. No increase in right ventricular wall  thickness.  5. Left atrial size was mildly dilated.  6. Right atrial size was normal.  7. The mitral valve is normal in structure. Trivial mitral valve  regurgitation. No evidence of mitral stenosis.  8. The tricuspid valve is normal in structure. Tricuspid valve  regurgitation is not demonstrated.  9. The aortic valve is tricuspid. Aortic valve regurgitation is mild.  Mild to moderate aortic valve sclerosis/calcification without any evidence  of aortic stenosis.  10. The pulmonic valve was normal in structure. Pulmonic valve  regurgitation is not visualized.  11. Aortic dilatation noted.  12. There is mild dilatation of the aortic root measuring 39 mm.  13. The inferior vena cava is normal in size with greater than 50%  respiratory variability, suggesting right atrial pressure of 3 mmHg.    Echo 11/01/15: LVEF >55%.  Mild concentric LVH. Mild anteroseptal and apical septal hypokinesis. Normal RV  function.  Lexiscan Myoview 09/2017:  The left ventricular ejection fraction is mildly decreased (45-54%).  Nuclear stress EF: 48%.  There was no ST segment deviation noted during stress.  Defect 1: There is a small defect of severe severity present in the apical inferior, apical lateral and apex location.  This is a low risk study.   Abnormal, low risk stress nuclear study with prior apical infarct; no ischemia; EF 48 with mild global hypokinesis; mild LVE.   Recent Labs: 05/10/2020: ALT 17; BUN 47; Creatinine, Ser 1.94; Hemoglobin 11.2; Platelets 409; Potassium 4.2; Sodium 137    Lipid Panel    Component Value Date/Time   CHOL 116 01/06/2019 0819   TRIG 103 08/14/2019 2044   HDL 30 (L) 01/06/2019 0819   CHOLHDL 3.9 01/06/2019 0819   CHOLHDL 4 05/24/2018 0923   VLDL 34.8 05/24/2018 0923   LDLCALC 58 01/06/2019 0819    09/07/15: Sodium 138, potassium 4.5, BUN 20, creatinine 1.02 AST 17, ALT 24 Total cholesterol 113, triglycerides 146, HDL 32, LDL 52 TSH 1.76 WBC 11.7, hemoglobin 13.4, hematocrit 40.2, platelets 317 Hemoglobin A1c 8.9 on a relatively dry%  Wt Readings from Last 3 Encounters:  10/09/20 157 lb 9.6 oz (71.5 kg)  08/02/20 162 lb 4 oz (73.6 kg)  07/20/20 160 lb (72.6 kg)     ASSESSMENT AND PLAN:  # Hypertension:  # Chronic diastolic heart failure: LVEF improved to 50-55%.  He is euvolemic on exam.  He is not having any chest pain.  I am happy that he is working with physical therapy and will be getting some in-home services.  Blood pressure is well-controlled.  Continue carvedilol, spironolactone, and furosemide.  # CAD s/p cardiac arrest and LAD PCI:  # Hyperlipidemia. No ischemic symptoms.  Lexiscan Myoview was negative for ischemia 09/2017. Continue aspirin, atorvastatin, and carvedilol.  Lipids will be checked with Dr. [name] next week.  CKD III:  He may be a good candidate for ARB or [place].  Given his issues with syncope and falls will  not add for now.  Consider switching spironolactone to Entresto or ARB in the future.    Current medicines are reviewed at length with the patient today.  The patient does not have concerns regarding medicines.  The following changes have been made: none Labs/ tests ordered today include:   No orders of the defined types were placed in this encounter.    Disposition:   FU with Faviola Klare C. Oval Linsey, MD, [facility] in 6 months.    Signed, Kolton Kienle C. Oval Linsey, MD, [facility]  10/09/2020 12:06 PM    [place] [facility]

## 2020-10-09 NOTE — Telephone Encounter (Signed)
Form received given to Dr.Parker to sign

## 2020-10-09 NOTE — Patient Instructions (Signed)
Medication Instructions:  Your physician recommends that you continue on your current medications as directed. Please refer to the Current Medication list given to you today.   *If you need a refill on your cardiac medications before your next appointment, please call your pharmacy*  Lab Work: NONE   Testing/Procedures: NONE   Follow-Up: At [facility], you and your health needs are our priority.  As part of our continuing mission to provide you with exceptional heart care, we have created designated Provider Care Teams.  These Care Teams include your primary Cardiologist (physician) and Advanced Practice Providers (APPs -  Physician Assistants and Nurse Practitioners) who all work together to provide you with the care you need, when you need it.  We recommend signing up for the patient portal called "MyChart".  Sign up information is provided on this After Visit Summary.  MyChart is used to connect with patients for Virtual Visits (Telemedicine).  Patients are able to view lab/test results, encounter notes, upcoming appointments, etc.  Non-urgent messages can be sent to your provider as well.   To learn more about what you can do with MyChart, go to [email or web address].    Your next appointment:   6 month(s)  The format for your next appointment:   In Person  Provider:   You may see Skeet Latch, MD or one of the following Advanced Practice Providers on your designated Care Team:    [place], PA-C  Coletta Memos, FNP

## 2020-10-12 ENCOUNTER — Other Ambulatory Visit: Payer: Self-pay | Admitting: Family Medicine

## 2020-10-13 DIAGNOSIS — I10 Essential (primary) hypertension: Secondary | ICD-10-CM | POA: Diagnosis not present

## 2020-10-13 DIAGNOSIS — E119 Type 2 diabetes mellitus without complications: Secondary | ICD-10-CM | POA: Diagnosis not present

## 2020-10-13 DIAGNOSIS — R54 Age-related physical debility: Secondary | ICD-10-CM | POA: Diagnosis not present

## 2020-10-13 DIAGNOSIS — I509 Heart failure, unspecified: Secondary | ICD-10-CM | POA: Diagnosis not present

## 2020-10-15 ENCOUNTER — Other Ambulatory Visit: Payer: Self-pay

## 2020-10-15 ENCOUNTER — Other Ambulatory Visit (INDEPENDENT_AMBULATORY_CARE_PROVIDER_SITE_OTHER): Payer: HMO

## 2020-10-15 DIAGNOSIS — Z0000 Encounter for general adult medical examination without abnormal findings: Secondary | ICD-10-CM

## 2020-10-15 LAB — LIPID PANEL
Cholesterol: 104 mg/dL (ref 0–200)
HDL: 30.5 mg/dL — ABNORMAL LOW (ref >39.00)
LDL Cholesterol: 42 mg/dL (ref 0–99)
NonHDL: 73.93
Total CHOL/HDL Ratio: 3
Triglycerides: 162 mg/dL — ABNORMAL HIGH (ref 0.0–149.0)
VLDL: 32.4 mg/dL (ref 0.0–40.0)

## 2020-10-15 LAB — CBC
HCT: 33.9 % — ABNORMAL LOW (ref 39.0–52.0)
Hemoglobin: 11.9 g/dL — ABNORMAL LOW (ref 13.0–17.0)
MCHC: 35.2 g/dL (ref 30.0–36.0)
MCV: 91.9 fl (ref 78.0–100.0)
Platelets: 423 10*3/uL — ABNORMAL HIGH (ref 150.0–400.0)
RBC: 3.69 Mil/uL — ABNORMAL LOW (ref 4.22–5.81)
RDW: 13.6 % (ref 11.5–15.5)
WBC: 10 10*3/uL (ref 4.0–10.5)

## 2020-10-15 LAB — HEMOGLOBIN A1C: Hgb A1c MFr Bld: 7.5 % — ABNORMAL HIGH (ref 4.6–6.5)

## 2020-10-15 LAB — TSH: TSH: 3.64 u[IU]/mL (ref 0.35–4.50)

## 2020-10-15 LAB — PSA: PSA: 2.77 ng/mL (ref 0.10–4.00)

## 2020-10-16 ENCOUNTER — Other Ambulatory Visit: Payer: Self-pay | Admitting: Family Medicine

## 2020-10-16 NOTE — Progress Notes (Signed)
Please inform patient of the following:  Labs are all stable. We can discuss more at his upcoming appointment.  Jeffrey [name]. [name], MD 10/16/2020 2:01 PM

## 2020-10-17 DIAGNOSIS — H3582 Retinal ischemia: Secondary | ICD-10-CM | POA: Diagnosis not present

## 2020-10-17 DIAGNOSIS — E113513 Type 2 diabetes mellitus with proliferative diabetic retinopathy with macular edema, bilateral: Secondary | ICD-10-CM | POA: Diagnosis not present

## 2020-10-17 DIAGNOSIS — H2513 Age-related nuclear cataract, bilateral: Secondary | ICD-10-CM | POA: Diagnosis not present

## 2020-10-17 LAB — HM DIABETES EYE EXAM

## 2020-10-18 ENCOUNTER — Ambulatory Visit: Payer: HMO | Admitting: Family Medicine

## 2020-10-20 DIAGNOSIS — R54 Age-related physical debility: Secondary | ICD-10-CM | POA: Diagnosis not present

## 2020-10-20 DIAGNOSIS — I10 Essential (primary) hypertension: Secondary | ICD-10-CM | POA: Diagnosis not present

## 2020-10-20 DIAGNOSIS — E119 Type 2 diabetes mellitus without complications: Secondary | ICD-10-CM | POA: Diagnosis not present

## 2020-10-20 DIAGNOSIS — I509 Heart failure, unspecified: Secondary | ICD-10-CM | POA: Diagnosis not present

## 2020-10-22 ENCOUNTER — Encounter: Payer: Self-pay | Admitting: Family Medicine

## 2020-10-22 ENCOUNTER — Ambulatory Visit (INDEPENDENT_AMBULATORY_CARE_PROVIDER_SITE_OTHER): Payer: HMO | Admitting: Family Medicine

## 2020-10-22 ENCOUNTER — Telehealth: Payer: Self-pay | Admitting: *Deleted

## 2020-10-22 ENCOUNTER — Ambulatory Visit (INDEPENDENT_AMBULATORY_CARE_PROVIDER_SITE_OTHER): Payer: HMO

## 2020-10-22 ENCOUNTER — Other Ambulatory Visit: Payer: Self-pay

## 2020-10-22 VITALS — BP 116/67 | HR 72 | Temp 97.3°F | Ht 68.0 in | Wt 155.2 lb

## 2020-10-22 DIAGNOSIS — E1159 Type 2 diabetes mellitus with other circulatory complications: Secondary | ICD-10-CM | POA: Diagnosis not present

## 2020-10-22 DIAGNOSIS — G8929 Other chronic pain: Secondary | ICD-10-CM | POA: Insufficient documentation

## 2020-10-22 DIAGNOSIS — M545 Low back pain, unspecified: Secondary | ICD-10-CM | POA: Diagnosis not present

## 2020-10-22 DIAGNOSIS — I152 Hypertension secondary to endocrine disorders: Secondary | ICD-10-CM

## 2020-10-22 DIAGNOSIS — Z794 Long term (current) use of insulin: Secondary | ICD-10-CM | POA: Diagnosis not present

## 2020-10-22 DIAGNOSIS — E1142 Type 2 diabetes mellitus with diabetic polyneuropathy: Secondary | ICD-10-CM | POA: Diagnosis not present

## 2020-10-22 DIAGNOSIS — M549 Dorsalgia, unspecified: Secondary | ICD-10-CM | POA: Diagnosis not present

## 2020-10-22 DIAGNOSIS — E1169 Type 2 diabetes mellitus with other specified complication: Secondary | ICD-10-CM | POA: Diagnosis not present

## 2020-10-22 DIAGNOSIS — E785 Hyperlipidemia, unspecified: Secondary | ICD-10-CM | POA: Diagnosis not present

## 2020-10-22 MED ORDER — JANUMET XR 100-1000 MG PO TB24: 1.0000 | ORAL_TABLET | Freq: Every day | ORAL | 3 refills | Status: DC

## 2020-10-22 NOTE — Assessment & Plan Note (Signed)
Last LDL 42.  Continue Lipitor 80 mg daily.

## 2020-10-22 NOTE — Assessment & Plan Note (Signed)
No red flags.  Likely secondary to arthritis.  Reassuring exam today.  Will check x-ray as he has had this for several months.  Usually takes Tylenol extra strength which helps.  Not a candidate for NSAIDs due to cardiac history.  He is currently working with physical therapy.  Declined referral to sports medicine for the time being.

## 2020-10-22 NOTE — Patient Instructions (Signed)
It was very nice to see you today!  Please switch from the Metformin to the Braman.  I hope this should help some with the loose stools and diarrhea.  We will get an x-ray of your back today.  We will see you back in 3 months.  Come back to see me sooner if needed.  Take care, Dr [name]  Please try these tips to maintain a healthy lifestyle:   Eat at least 3 REAL meals and 1-2 snacks per day.  Aim for no more than 5 hours between eating.  If you eat breakfast, please do so within one hour of getting up.    Each meal should contain half fruits/vegetables, one quarter protein, and one quarter carbs (no bigger than a computer mouse)   Cut down on sweet beverages. This includes juice, soda, and sweet tea.     Drink at least 1 glass of water with each meal and aim for at least 8 glasses per day   Exercise at least 150 minutes every week.    Preventive Care 6-28 Years Old, Male Preventive care refers to lifestyle choices and visits with your health care provider that can promote health and wellness. This includes:  A yearly physical exam. This is also called an annual wellness visit.  Regular dental and eye exams.  Immunizations.  Screening for certain conditions.  Healthy lifestyle choices, such as: ? Eating a healthy diet. ? Getting regular exercise. ? Not using drugs or products that contain nicotine and tobacco. ? Limiting alcohol use. What can I expect for my preventive care visit? Physical exam Your health care provider will check your:  Height and weight. These may be used to calculate your BMI (body mass index). BMI is a measurement that tells if you are at a healthy weight.  Heart rate and blood pressure.  Body temperature.  Skin for abnormal spots. Counseling Your health care provider may ask you questions about your:  Past medical problems.  Family's medical history.  Alcohol, tobacco, and drug use.  Emotional well-being.  Home life and  relationship well-being.  Sexual activity.  Diet, exercise, and sleep habits.  Work and work Statistician.  Access to firearms. What immunizations do I need? Vaccines are usually given at various ages, according to a schedule. Your health care provider will recommend vaccines for you based on your age, medical history, and lifestyle or other factors, such as travel or where you work.   What tests do I need? Blood tests  Lipid and cholesterol levels. These may be checked every 5 years, or more often if you are over 46 years old.  Hepatitis C test.  Hepatitis B test. Screening  Lung cancer screening. You may have this screening every year starting at age 40 if you have a 30-pack-year history of smoking and currently smoke or have quit within the past 15 years.  Prostate cancer screening. Recommendations will vary depending on your family history and other risks.  Genital exam to check for testicular cancer or hernias.  Colorectal cancer screening. ? All adults should have this screening starting at age 40 and continuing until age 5. ? Your health care provider may recommend screening at age 64 if you are at increased risk. ? You will have tests every 1-10 years, depending on your results and the type of screening test.  Diabetes screening. ? This is done by checking your blood sugar (glucose) after you have not eaten for a while (fasting). ? You may have  this done every 1-3 years.  STD (sexually transmitted disease) testing, if you are at risk. Follow these instructions at home: Eating and drinking  Eat a diet that includes fresh fruits and vegetables, whole grains, lean protein, and low-fat dairy products.  Take vitamin and mineral supplements as recommended by your health care provider.  Do not drink alcohol if your health care provider tells you not to drink.  If you drink alcohol: ? Limit how much you have to 0-2 drinks a day. ? Be aware of how much alcohol is in  your drink. In the [place] one drink equals one 12 oz bottle of beer (355 mL), one 5 oz glass of wine (148 mL), or one 1 oz glass of hard liquor (44 mL).   Lifestyle  Take daily care of your teeth and gums. Brush your teeth every morning and night with fluoride toothpaste. Floss one time each day.  Stay active. Exercise for at least 30 minutes 5 or more days each week.  Do not use any products that contain nicotine or tobacco, such as cigarettes, e-cigarettes, and chewing tobacco. If you need help quitting, ask your health care provider.  Do not use drugs.  If you are sexually active, practice safe sex. Use a condom or other form of protection to prevent STIs (sexually transmitted infections).  If told by your health care provider, take low-dose aspirin daily starting at age 52.  Find healthy ways to cope with stress, such as: ? Meditation, yoga, or listening to music. ? Journaling. ? Talking to a trusted person. ? Spending time with friends and family. Safety  Always wear your seat belt while driving or riding in a vehicle.  Do not drive: ? If you have been drinking alcohol. Do not ride with someone who has been drinking. ? When you are tired or distracted. ? While texting.  Wear a helmet and other protective equipment during sports activities.  If you have firearms in your house, make sure you follow all gun safety procedures. What's next?  Go to your health care provider once a year for an annual wellness visit.  Ask your health care provider how often you should have your eyes and teeth checked.  Stay up to date on all vaccines. This information is not intended to replace advice given to you by your health care provider. Make sure you discuss any questions you have with your health care provider. Document Revised: 04/26/2019 Document Reviewed: 07/22/2018 Elsevier Patient Education  2021 [employer].

## 2020-10-22 NOTE — Telephone Encounter (Signed)
LVM to Lattie Haw at [facility] was approved, Dr [name] Cancelled Skilled Nurse on 09/27/2020

## 2020-10-22 NOTE — Assessment & Plan Note (Signed)
Blood pressure goal.  Continue Coreg 3.125 mg twice daily and spironolactone 25 mg daily.

## 2020-10-22 NOTE — Progress Notes (Signed)
Chief Complaint:  Jeffrey [name] is a 65 y.o. male who presents today for his annual comprehensive physical exam.    Assessment/Plan:  Chronic Problems Addressed Today: Hypertension associated with diabetes ([place]) Blood pressure goal.  Continue Coreg 3.125 mg twice daily and spironolactone 25 mg daily.  [name] Last A1c 7.5.  Is having some loose stools and diarrhea with Metformin.  We will switch from the 1000 mg twice daily that he is currently on to Janumet [phone] once daily.  Recheck A1c in 3 months.  Chronic back pain No red flags.  Likely secondary to arthritis.  Reassuring exam today.  Will check x-ray as he has had this for several months.  Usually takes Tylenol extra strength which helps.  Not a candidate for NSAIDs due to cardiac history.  He is currently working with physical therapy.  Declined referral to sports medicine for the time being.  Hyperlipidemia associated with type 2 diabetes mellitus ([name]) Last LDL 42.  Continue Lipitor 80 mg daily.   Preventative Healthcare: UTD on vaccines and screenings.  Had labs done last week.  Patient Counseling(The following topics were reviewed and/or handout was given):  -Nutrition: Stressed importance of moderation in sodium/caffeine intake, saturated fat and cholesterol, caloric balance, sufficient intake of fresh fruits, vegetables, and fiber.  -Stressed the importance of regular exercise.   -Substance Abuse: Discussed cessation/primary prevention of tobacco, alcohol, or other drug use; driving or other dangerous activities under the influence; availability of treatment for abuse.   -Injury prevention: Discussed safety belts, safety helmets, smoke detector, smoking near bedding or upholstery.   -Sexuality: Discussed sexually transmitted diseases, partner selection, use of condoms, avoidance of unintended pregnancy and contraceptive alternatives.   -Dental health: Discussed importance of regular tooth  brushing, flossing, and dental visits.  -Health maintenance and immunizations reviewed. Please refer to Health maintenance section.  Return to care in 1 year for next preventative visit.     Subjective:  HPI:  He has no acute complaints today.   Lifestyle Diet: Balanced.  Exercise: Working with physical therapy.   Depression screen [facility] 2/9 10/22/2020  Decreased Interest 0  Down, Depressed, Hopeless 0  PHQ - 2 Score 0  Altered sleeping -  Tired, decreased energy -  Change in appetite -  Feeling bad or failure about yourself  -  Trouble concentrating -  Moving slowly or fidgety/restless -  Suicidal thoughts -  PHQ-9 Score -  Difficult doing work/chores -  Some recent data might be hidden    Health Maintenance Due  Topic Date Due  . URINE MICROALBUMIN  05/29/2017  . FOOT EXAM  07/14/2018  . COVID-19 Vaccine (4 - Booster for Pfizer series) 10/15/2020     ROS: Per HPI, otherwise a complete review of systems was negative.   PMH:  The following were reviewed and entered/updated in epic: Past Medical History:  Diagnosis Date  . Cardiac arrest ([place]) 05/22/2016  . Cataract   . CHF (congestive heart failure) ([place])   . Diabetes mellitus without complication ([place])    type 2  . Diabetic retinopathy ([place])   . Hyperlipidemia   . Hypertension   . Memory loss    mild  . Myocardial infarct ([place]) 2105  . Retinopathy    Both eyes  . S/P primary angioplasty with coronary stent 05/22/2016  . Vitamin B12 deficiency   . Vitreous hemorrhage of left eye ([name])    proliferative diabetic retinopathy   Patient Active Problem List  Diagnosis Date Noted  . Chronic back pain 10/22/2020  . Peripheral vertigo 05/15/2020  . Dermatitis 01/04/2020  . Unintentional weight loss with loose stools 05/19/2019  . Low vitamin B12 level 03/29/2019  . Heme positive stool 03/14/2019  . Normocytic anemia 03/14/2019  . Chronic diastolic heart failure ([place]) 02/08/2019  . Diabetic retinopathy  ([place]) 09/08/2018  . Physical debility 05/24/2018  . Varicose veins of both lower extremities 03/01/2018  . Fatigue due to exertion 10/14/2017  . GERD (gastroesophageal reflux disease) 08/25/2017  . CAD in native artery 04/14/2017  . Hyperlipidemia associated with type 2 diabetes mellitus ([place]) 04/14/2017  . Diabetic peripheral neuropathy associated with type 2 diabetes mellitus ([place]) 08/17/2016  . T2DM (type 2 diabetes mellitus) ([place]) 05/02/2016  . Hypertension associated with diabetes ([place]) 05/02/2016   Past Surgical History:  Procedure Laterality Date  . ANGIOPLASTY     2015  . COLONOSCOPY     multiple  . EYE SURGERY    . PARS PLANA VITRECTOMY Left 03/15/2020   Procedure: PARS PLANA VITRECTOMY WITH 25 GAUGE;  Surgeon: [name], MD;  Location: [facility];  Service: Ophthalmology;  Laterality: Left;  . PHOTOCOAGULATION WITH LASER Left 03/15/2020   Procedure: PHOTOCOAGULATION WITH LASER; INTRAVITREAL INJECTION OF AVASTIN;  Surgeon: [name], MD;  Location: [facility];  Service: Ophthalmology;  Laterality: Left;  . REFRACTIVE SURGERY    . UPPER GI ENDOSCOPY     several  . VITRECTOMY      Family History  Problem Relation Age of Onset  . Alzheimer's disease Mother   . Heart disease Father   . Peripheral Artery Disease Father   . Heart disease Brother   . Colon polyps Neg Hx   . Prostate cancer Neg Hx   . Rectal cancer Neg Hx   . Esophageal cancer Neg Hx     Medications- reviewed and updated Current Outpatient Medications  Medication Sig Dispense Refill  . acetaminophen (TYLENOL) 325 MG tablet Take 325-650 mg by mouth every 6 (six) hours as needed for moderate pain.    [name] Kitchen aspirin EC 81 MG tablet Take 81 mg by mouth daily.    [name] Kitchen atorvastatin (LIPITOR) 80 MG tablet TAKE 1 TABLET BY MOUTH EVERY DAY 90 tablet 1  . Blood Glucose Monitoring Suppl (ONETOUCH VERIO IQ SYSTEM) w/Device KIT 1 kit by Does not apply route 4 (four) times daily. 1 kit 0  . carvedilol (COREG) 3.125 MG tablet  TAKE 1 TABLET BY MOUTH 2 TIMES DAILY WITH MEAL 180 tablet 1  . DUREZOL 0.05 % EMUL Place 1 drop into the left eye 2 (two) times daily.    . furosemide (LASIX) 40 MG tablet TAKE 1 TABLET BY MOUTH EVERY DAY 90 tablet 1  . gentamicin (GARAMYCIN) 0.3 % ophthalmic solution     . gentamicin-prednisoLONE 0.3-1 % ophthalmic drops 1 drop 2 (two) times daily.    . insulin aspart (NOVOLOG) 100 UNIT/ML injection USE 3X DAILY AS NEEDED FOR HIGH BLOOD SUGARS. 5 UNITS FOR SUGAR >200, 10 UNITS >300, 15 UNITS 400+ 10 mL 5  . Insulin Pen Needle (PEN NEEDLES) 33G X 4 MM MISC 1 application by Does not apply route in the morning, at noon, in the evening, and at bedtime. 300 each prn  . Insulin Syringe-Needle U-100 (B-D INS SYR HALF-UNIT .3CC/31G) 31G X 5/16" 0.3 ML MISC Use 6 times a day 200 each 11  . LANTUS SOLOSTAR 100 UNIT/ML Solostar Pen INJECT 30 UNITS INTO THE SKIN DAILY. 15 mL 2  .  meclizine (ANTIVERT) 25 MG tablet Take 1 tablet (25 mg total) by mouth 3 (three) times daily as needed for dizziness. 30 tablet 0  . ofloxacin (OCUFLOX) 0.3 % ophthalmic solution Place 1 drop into the left eye 4 (four) times daily.    Glory Rosebush Delica Lancets 74B MISC Use to check blood sugar 4 times per day 200 each 11  . ONETOUCH VERIO test strip CHECK BLOOD SUGAR 4 TIMES PER DAY 200 strip 11  . SitaGLIPtin-MetFORMIN HCl (JANUMET XR) [phone] MG TB24 Take 1 tablet by mouth daily. 90 tablet 3  . spironolactone (ALDACTONE) 25 MG tablet Take 1 tablet (25 mg total) by mouth daily. 90 tablet 3  . triamcinolone cream (KENALOG) 0.1 % Apply 1 application topically 2 (two) times daily. 454 g 0  . vitamin B-12 (CYANOCOBALAMIN) 1000 MCG tablet Take 1,000 mcg by mouth daily.     No current facility-administered medications for this visit.    Allergies-reviewed and updated Allergies  Allergen Reactions  . Codeine Nausea And Vomiting  . Penicillins Rash    Did it involve swelling of the face/tongue/throat, SOB, or low BP? no Did it  involve sudden or severe rash/hives, skin peeling, or any reaction on the inside of your mouth or nose? yes Did you need to seek medical attention at a hospital or doctor's office? yes When did it last happen?childhood If all above answers are "NO", may proceed with cephalosporin use.     Social History   Socioeconomic History  . Marital status: Single    Spouse name: Not on file  . Number of children: Not on file  . Years of education: Not on file  . Highest education level: Not on file  Occupational History  . Not on file  Tobacco Use  . Smoking status: Never Smoker  . Smokeless tobacco: Never Used  Vaping Use  . Vaping Use: Never used  Substance and Sexual Activity  . Alcohol use: Not Currently    Comment: occ  . Drug use: No  . Sexual activity: Not Currently  Other Topics Concern  . Not on file  Social History Narrative  . Not on file   Social Determinants of Health   Financial Resource Strain: Not on file  Food Insecurity: No Food Insecurity  . Worried About Charity fundraiser in the Last Year: Never true  . Ran Out of Food in the Last Year: Never true  Transportation Needs: No Transportation Needs  . Lack of Transportation (Medical): No  . Lack of Transportation (Non-Medical): No  Physical Activity: Not on file  Stress: Not on file  Social Connections: Not on file        Objective:  Physical Exam: BP 116/67   Pulse 72   Temp (!) 97.3 F (36.3 C) (Temporal)   Ht '5\' 8"'  (1.727 m)   Wt 155 lb 3.2 oz (70.4 kg)   BMI 23.60 kg/m   Body mass index is 23.6 kg/m. Wt Readings from Last 3 Encounters:  10/22/20 155 lb 3.2 oz (70.4 kg)  10/09/20 157 lb 9.6 oz (71.5 kg)  08/02/20 162 lb 4 oz (73.6 kg)   Gen: NAD, resting comfortably HEENT: TMs normal bilaterally. OP clear. No thyromegaly noted.  CV: RRR with no murmurs appreciated Pulm: NWOB, CTAB with no crackles, wheezes, or rhonchi GI: Normal bowel sounds present. Soft, Nontender,  Nondistended. MSK: no edema, cyanosis, or clubbing noted Skin: warm, dry Neuro: CN2-12 grossly intact. Strength 5/5 in upper and lower extremities.  Reflexes symmetric and intact bilaterally.  Psych: Normal affect and thought content     Shervon Kerwin M. [name], MD 10/22/2020 11:28 AM

## 2020-10-22 NOTE — Assessment & Plan Note (Signed)
Last A1c 7.5.  Is having some loose stools and diarrhea with Metformin.  We will switch from the 1000 mg twice daily that he is currently on to Janumet [phone] once daily.  Recheck A1c in 3 months.

## 2020-10-23 NOTE — Progress Notes (Signed)
Please inform patient of the following:  X-ray confirms arthritis in his lower back.  We can have him see physical therapy or sports medicine if she is interested but otherwise he can continue taking over-the-counter meds as needed.

## 2020-10-24 ENCOUNTER — Telehealth: Payer: Self-pay

## 2020-10-24 NOTE — Telephone Encounter (Signed)
Amparo Bristol, physical therapist with [facility], called requesting verbal orders for pt. Requesting to continue PT twice a week for 3 weeks and once a week for 1 week. Please advise.

## 2020-10-25 NOTE — Telephone Encounter (Signed)
[facility] following up on this

## 2020-10-25 NOTE — Telephone Encounter (Signed)
VO given to Amparo Bristol,

## 2020-10-25 NOTE — Telephone Encounter (Signed)
Ok to give VO? 

## 2020-10-25 NOTE — Telephone Encounter (Signed)
Ok with me. Please place any necessary orders. 

## 2020-10-26 ENCOUNTER — Telehealth: Payer: Self-pay | Admitting: *Deleted

## 2020-10-26 ENCOUNTER — Other Ambulatory Visit: Payer: Self-pay | Admitting: *Deleted

## 2020-10-26 MED ORDER — METFORMIN HCL ER 500 MG PO TB24: 1000.0000 mg | ORAL_TABLET | Freq: Two times a day (BID) | ORAL | 0 refills | Status: DC

## 2020-10-26 NOTE — Telephone Encounter (Signed)
Ok for him to go back to previous dose of metformin. Please send in new rx if needed.  [name]. [name], MD 10/26/2020 10:11 AM

## 2020-10-26 NOTE — Telephone Encounter (Signed)
Patient aware, no refills needed at this time  Will call back if glucose not improving

## 2020-10-26 NOTE — Telephone Encounter (Signed)
Patient stated glucose running high 300-400 This morning 388 fasting. Stated Rx Janumet not working and want to go back to metformin. It is ok to start metformin today  Please advise

## 2020-10-27 ENCOUNTER — Encounter: Payer: Self-pay | Admitting: Family Medicine

## 2020-10-27 DIAGNOSIS — I509 Heart failure, unspecified: Secondary | ICD-10-CM | POA: Diagnosis not present

## 2020-10-27 DIAGNOSIS — I10 Essential (primary) hypertension: Secondary | ICD-10-CM | POA: Diagnosis not present

## 2020-10-27 DIAGNOSIS — E119 Type 2 diabetes mellitus without complications: Secondary | ICD-10-CM | POA: Diagnosis not present

## 2020-10-27 DIAGNOSIS — R54 Age-related physical debility: Secondary | ICD-10-CM | POA: Diagnosis not present

## 2020-10-29 NOTE — Telephone Encounter (Signed)
See note

## 2020-10-29 NOTE — Telephone Encounter (Signed)
Notified patient of results concerns,Patient voices understanding.

## 2020-11-03 DIAGNOSIS — I509 Heart failure, unspecified: Secondary | ICD-10-CM | POA: Diagnosis not present

## 2020-11-03 DIAGNOSIS — I10 Essential (primary) hypertension: Secondary | ICD-10-CM | POA: Diagnosis not present

## 2020-11-03 DIAGNOSIS — E119 Type 2 diabetes mellitus without complications: Secondary | ICD-10-CM | POA: Diagnosis not present

## 2020-11-03 DIAGNOSIS — R54 Age-related physical debility: Secondary | ICD-10-CM | POA: Diagnosis not present

## 2020-11-10 DIAGNOSIS — I10 Essential (primary) hypertension: Secondary | ICD-10-CM | POA: Diagnosis not present

## 2020-11-10 DIAGNOSIS — I509 Heart failure, unspecified: Secondary | ICD-10-CM | POA: Diagnosis not present

## 2020-11-10 DIAGNOSIS — E119 Type 2 diabetes mellitus without complications: Secondary | ICD-10-CM | POA: Diagnosis not present

## 2020-11-10 DIAGNOSIS — R54 Age-related physical debility: Secondary | ICD-10-CM | POA: Diagnosis not present

## 2020-11-12 ENCOUNTER — Telehealth: Payer: Self-pay

## 2020-11-12 NOTE — Telephone Encounter (Signed)
Please call and check on patient. We should stop metformin if the diarrhea is persistent.  [name]. [name], MD 11/12/2020 12:17 PM

## 2020-11-12 NOTE — Telephone Encounter (Signed)
Jeffrey [name] is [facility] in [facility] to report patient fell last week, no injuries but is still contentiously having diarrhea.

## 2020-11-12 NOTE — Telephone Encounter (Signed)
See note

## 2020-11-13 NOTE — Telephone Encounter (Signed)
Patient states he has been doing better, diarrhea has stopped. Will contact us if it starts back up. Will continue metformin.

## 2020-11-13 NOTE — Telephone Encounter (Signed)
Called patient, unable to reach. Will attempt to reach again.

## 2020-11-17 DIAGNOSIS — I10 Essential (primary) hypertension: Secondary | ICD-10-CM | POA: Diagnosis not present

## 2020-11-17 DIAGNOSIS — R2689 Other abnormalities of gait and mobility: Secondary | ICD-10-CM | POA: Diagnosis not present

## 2020-11-17 DIAGNOSIS — I509 Heart failure, unspecified: Secondary | ICD-10-CM | POA: Diagnosis not present

## 2020-11-17 DIAGNOSIS — E119 Type 2 diabetes mellitus without complications: Secondary | ICD-10-CM | POA: Diagnosis not present

## 2020-11-17 DIAGNOSIS — K429 Umbilical hernia without obstruction or gangrene: Secondary | ICD-10-CM | POA: Diagnosis not present

## 2020-11-19 ENCOUNTER — Telehealth: Payer: Self-pay | Admitting: *Deleted

## 2020-11-19 NOTE — Telephone Encounter (Signed)
Ok with me. Please place any necessary orders. 

## 2020-11-19 NOTE — Telephone Encounter (Signed)
need addition PT and home health aid  Fall yesterday and another last week  Remi Deter will send forms to [facility]

## 2020-11-24 DIAGNOSIS — I10 Essential (primary) hypertension: Secondary | ICD-10-CM | POA: Diagnosis not present

## 2020-11-24 DIAGNOSIS — E119 Type 2 diabetes mellitus without complications: Secondary | ICD-10-CM | POA: Diagnosis not present

## 2020-11-24 DIAGNOSIS — I509 Heart failure, unspecified: Secondary | ICD-10-CM | POA: Diagnosis not present

## 2020-11-24 DIAGNOSIS — R54 Age-related physical debility: Secondary | ICD-10-CM | POA: Diagnosis not present

## 2020-11-24 DIAGNOSIS — R2689 Other abnormalities of gait and mobility: Secondary | ICD-10-CM | POA: Diagnosis not present

## 2020-12-01 DIAGNOSIS — E119 Type 2 diabetes mellitus without complications: Secondary | ICD-10-CM | POA: Diagnosis not present

## 2020-12-01 DIAGNOSIS — R2689 Other abnormalities of gait and mobility: Secondary | ICD-10-CM | POA: Diagnosis not present

## 2020-12-01 DIAGNOSIS — I10 Essential (primary) hypertension: Secondary | ICD-10-CM | POA: Diagnosis not present

## 2020-12-01 DIAGNOSIS — R54 Age-related physical debility: Secondary | ICD-10-CM | POA: Diagnosis not present

## 2020-12-01 DIAGNOSIS — I509 Heart failure, unspecified: Secondary | ICD-10-CM | POA: Diagnosis not present

## 2020-12-05 ENCOUNTER — Telehealth: Payer: Self-pay

## 2020-12-05 NOTE — Telephone Encounter (Signed)
Jeffrey [name] is calling in from [place] to report patient fell twice yesterday, as bruising on R ankle. Calder's blood pressure is slightly elevated 163/82, Jeffrey [name] would like to continue PT for once a week for 6 weeks.

## 2020-12-08 DIAGNOSIS — R54 Age-related physical debility: Secondary | ICD-10-CM | POA: Diagnosis not present

## 2020-12-08 DIAGNOSIS — R2689 Other abnormalities of gait and mobility: Secondary | ICD-10-CM | POA: Diagnosis not present

## 2020-12-08 DIAGNOSIS — I509 Heart failure, unspecified: Secondary | ICD-10-CM | POA: Diagnosis not present

## 2020-12-08 DIAGNOSIS — E119 Type 2 diabetes mellitus without complications: Secondary | ICD-10-CM | POA: Diagnosis not present

## 2020-12-08 DIAGNOSIS — I10 Essential (primary) hypertension: Secondary | ICD-10-CM | POA: Diagnosis not present

## 2020-12-14 ENCOUNTER — Telehealth: Payer: Self-pay | Admitting: Family Medicine

## 2020-12-14 NOTE — Chronic Care Management (AMB) (Signed)
  Chronic Care Management   Outreach Note  12/14/2020 Name: Jeffrey [name] MRN: [id number] DOB: 06-22-1956  Referred by: Vivi Barrack, MD Reason for referral : No chief complaint on file.   An unsuccessful telephone outreach was attempted today. The patient was referred to the pharmacist for assistance with care management and care coordination.   Follow Up Plan:   Lauretta Grill Upstream Scheduler

## 2020-12-17 ENCOUNTER — Other Ambulatory Visit: Payer: Self-pay | Admitting: *Deleted

## 2020-12-17 MED ORDER — SPIRONOLACTONE 25 MG PO TABS: 25.0000 mg | ORAL_TABLET | Freq: Every day | ORAL | 3 refills | Status: DC

## 2021-01-11 ENCOUNTER — Telehealth: Payer: Self-pay | Admitting: Family Medicine

## 2021-01-11 NOTE — Telephone Encounter (Signed)
Pt returned call. Please advise.  

## 2021-01-11 NOTE — Progress Notes (Signed)
  Chronic Care Management   Outreach Note  01/11/2021 Name: Floyed Masoud MRN: [id number] DOB: [birth year]  Referred by: Vivi Barrack, MD Reason for referral : No chief complaint on file.   An unsuccessful telephone outreach was attempted today. The patient was referred to the pharmacist for assistance with care management and care coordination.   Follow Up Plan:   Lauretta Grill Upstream Scheduler

## 2021-01-22 ENCOUNTER — Other Ambulatory Visit: Payer: Self-pay

## 2021-01-22 ENCOUNTER — Encounter: Payer: Self-pay | Admitting: Family Medicine

## 2021-01-22 ENCOUNTER — Ambulatory Visit (INDEPENDENT_AMBULATORY_CARE_PROVIDER_SITE_OTHER): Payer: HMO | Admitting: Family Medicine

## 2021-01-22 VITALS — BP 153/80 | HR 77 | Temp 98.0°F | Ht 68.0 in | Wt 156.0 lb

## 2021-01-22 DIAGNOSIS — I5032 Chronic diastolic (congestive) heart failure: Secondary | ICD-10-CM | POA: Diagnosis not present

## 2021-01-22 DIAGNOSIS — Z794 Long term (current) use of insulin: Secondary | ICD-10-CM | POA: Diagnosis not present

## 2021-01-22 DIAGNOSIS — I152 Hypertension secondary to endocrine disorders: Secondary | ICD-10-CM | POA: Diagnosis not present

## 2021-01-22 DIAGNOSIS — R5381 Other malaise: Secondary | ICD-10-CM | POA: Diagnosis not present

## 2021-01-22 DIAGNOSIS — E1159 Type 2 diabetes mellitus with other circulatory complications: Secondary | ICD-10-CM | POA: Diagnosis not present

## 2021-01-22 DIAGNOSIS — F4329 Adjustment disorder with other symptoms: Secondary | ICD-10-CM

## 2021-01-22 DIAGNOSIS — E1142 Type 2 diabetes mellitus with diabetic polyneuropathy: Secondary | ICD-10-CM

## 2021-01-22 DIAGNOSIS — F4320 Adjustment disorder, unspecified: Secondary | ICD-10-CM | POA: Insufficient documentation

## 2021-01-22 LAB — POCT GLYCOSYLATED HEMOGLOBIN (HGB A1C): Hemoglobin A1C: 7.4 % — AB (ref 4.0–5.6)

## 2021-01-22 NOTE — Progress Notes (Signed)
   Jeffrey [name] is a 65 y.o. male who presents today for an office visit.  Assessment/Plan:  Chronic Problems Addressed Today: Hypertension associated with diabetes ([place]) Above goal.  Previously well controlled.  We will continue current doses of Coreg 3.125 mg twice daily and spironolactone 25 mg daily.  [name] A1c stable at 7.4.  Continue metformin 1000 mg twice daily and Lantus 30 units daily.  Adjustment disorder Patient intermittently tearful on exam today.  He has support with his brother and faith.  Declined starting any medications at this point.    Subjective:  HPI:  Please see A/P for status of chronic conditions.  Patient is from review today.  They are concerned about patient's declining status over the last several months.  They have had some difficulty with home health aides.  Do not feel like he is getting adequate aide at home.  Patient is now completely homebound.  Cannot leave without assistive devices.  Becomes fatigued and short of breath with minimal exertion.  It is difficult for him to bathe himself.       Objective:  Physical Exam: BP (!) 153/80   Pulse 77   Temp 98 F (36.7 C)   Ht 5\' 8"  (1.727 m)   Wt 156 lb (70.8 kg)   SpO2 99%   BMI 23.72 kg/m   Wt Readings from Last 3 Encounters:  01/22/21 156 lb (70.8 kg)  10/22/20 155 lb 3.2 oz (70.4 kg)  10/09/20 157 lb 9.6 oz (71.5 kg)  Gen: No acute distress, resting comfortably Neuro: Grossly normal, moves all extremities Psych: Normal affect and thought content  Time Spent: 45 minutes of total time was spent on the date of the encounter performing the following actions: chart review prior to seeing the patient, obtaining history including his declining functional status, performing a medically necessary exam, counseling on the treatment plan, placing orders, and documenting in our EHR.        [name]. [name], MD 01/22/2021 12:01 PM

## 2021-01-22 NOTE — Assessment & Plan Note (Signed)
Above goal.  Previously well controlled.  We will continue current doses of Coreg 3.125 mg twice daily and spironolactone 25 mg daily.

## 2021-01-22 NOTE — Assessment & Plan Note (Signed)
Patient intermittently tearful on exam today.  He has support with his brother and faith.  Declined starting any medications at this point.

## 2021-01-22 NOTE — Patient Instructions (Signed)
It was very nice to see you today!  I will place a referral for home health and physical therapy.  Please come back in 3 months.  Come back to see [place] sooner if needed.  Take care, Dr [name]  PLEASE NOTE:  If you had any lab tests please let us know if you have not heard back within a few days. You may see your results on mychart before we have a chance to review them but we will give you a call once they are reviewed by [place]. If we ordered any referrals today, please let us know if you have not heard from their office within the next week.   Please try these tips to maintain a healthy lifestyle:  Eat at least 3 REAL meals and 1-2 snacks per day.  Aim for no more than 5 hours between eating.  If you eat breakfast, please do so within one hour of getting up.   Each meal should contain half fruits/vegetables, one quarter protein, and one quarter carbs (no bigger than a computer mouse)  Cut down on sweet beverages. This includes juice, soda, and sweet tea.   Drink at least 1 glass of water with each meal and aim for at least 8 glasses per day  Exercise at least 150 minutes every week.

## 2021-01-22 NOTE — Assessment & Plan Note (Signed)
A1c stable at 7.4.  Continue metformin 1000 mg twice daily and Lantus 30 units daily.

## 2021-01-23 ENCOUNTER — Encounter: Payer: Self-pay | Admitting: Family Medicine

## 2021-01-23 DIAGNOSIS — H2511 Age-related nuclear cataract, right eye: Secondary | ICD-10-CM | POA: Diagnosis not present

## 2021-01-23 DIAGNOSIS — Z961 Presence of intraocular lens: Secondary | ICD-10-CM | POA: Diagnosis not present

## 2021-01-23 DIAGNOSIS — H16121 Filamentary keratitis, right eye: Secondary | ICD-10-CM | POA: Diagnosis not present

## 2021-01-23 DIAGNOSIS — E113413 Type 2 diabetes mellitus with severe nonproliferative diabetic retinopathy with macular edema, bilateral: Secondary | ICD-10-CM | POA: Diagnosis not present

## 2021-01-24 NOTE — Telephone Encounter (Signed)
Patient stated due to being able to walk only for a short period of time,Will Physical Therapy benefit him. Please advise

## 2021-01-24 NOTE — Telephone Encounter (Signed)
Notified patient Referral has been sent to [facility] on [address].

## 2021-01-28 ENCOUNTER — Telehealth: Payer: Self-pay

## 2021-01-28 NOTE — Telephone Encounter (Signed)
Brother called stating that [facility] informed them that they would take patient on two weeks prior to patients appt with Dr. Yong Channel.    Stated they informed [facility] care that patient does have HTA.  Is requesting call back in regard.

## 2021-02-08 ENCOUNTER — Other Ambulatory Visit: Payer: Self-pay

## 2021-02-08 ENCOUNTER — Ambulatory Visit (HOSPITAL_BASED_OUTPATIENT_CLINIC_OR_DEPARTMENT_OTHER): Payer: HMO | Attending: Family Medicine | Admitting: Physical Therapy

## 2021-02-08 VITALS — BP 124/73 | HR 79

## 2021-02-08 DIAGNOSIS — R262 Difficulty in walking, not elsewhere classified: Secondary | ICD-10-CM | POA: Diagnosis not present

## 2021-02-08 DIAGNOSIS — R2689 Other abnormalities of gait and mobility: Secondary | ICD-10-CM | POA: Insufficient documentation

## 2021-02-08 DIAGNOSIS — M6281 Muscle weakness (generalized): Secondary | ICD-10-CM

## 2021-02-08 NOTE — Therapy (Signed)
[facility] [address] [place], [place], [address] Phone: [phone]   Fax:  [phone]  Physical Therapy Evaluation  Patient Details  Name: Jeffrey [name] MRN: [id number] Date of Birth: 21-Jul-1956 Referring Provider (PT): tiffiney   Encounter Date: 66/08/2020   PT End of Session - 02/08/21 1340     Visit Number 1    Number of Visits 16    Date for PT Re-Evaluation 04/05/21    Authorization Type HTA 2022 30 dollars    PT Start Time 6789    PT Stop Time 3810    PT Time Calculation (min) 43 min    Activity Tolerance Patient tolerated treatment well    Behavior During Therapy [facility] for tasks assessed/performed             Past Medical History:  Diagnosis Date   Cardiac arrest ([place]) 05/22/2016   Cataract    CHF (congestive heart failure) ([place])    Diabetes mellitus without complication ([place])    type 2   Diabetic retinopathy ([place])    Hyperlipidemia    Hypertension    Memory loss    mild   Myocardial infarct ([place]) 2105   Retinopathy    Both eyes   S/P primary angioplasty with coronary stent 05/22/2016   Vitamin B12 deficiency    Vitreous hemorrhage of left eye ([place])    proliferative diabetic retinopathy    Past Surgical History:  Procedure Laterality Date   ANGIOPLASTY     2015   COLONOSCOPY     multiple   EYE SURGERY     PARS PLANA VITRECTOMY Left 03/15/2020   Procedure: PARS PLANA VITRECTOMY WITH 25 GAUGE;  Surgeon: [name], MD;  Location: [facility];  Service: Ophthalmology;  Laterality: Left;   PHOTOCOAGULATION WITH LASER Left 03/15/2020   Procedure: PHOTOCOAGULATION WITH LASER; INTRAVITREAL INJECTION OF AVASTIN;  Surgeon: [name], MD;  Location: [facility];  Service: Ophthalmology;  Laterality: Left;   REFRACTIVE SURGERY     UPPER GI ENDOSCOPY     several   VITRECTOMY      Vitals:   02/08/21 1056  BP: 124/73  Pulse: 79  SpO2: 99%      Subjective Assessment - 02/08/21 1023     Subjective  class3 heart failure, good at rest, exhertion gets really tired since 2015. stent. tiffinay cardiologist. gets fatigue really easily, brother stated: even after shower takes alot of enegry in patient. loss of balance and increased weakness. diabetic:numbness in feet.  eyes: retinal surgery. fall: last week.    Patient is accompained by: Family member   brother   Pertinent History CAD; MI 2017; DMII with peripheral neuropathy    How long can you stand comfortably? depends on the day    How long can you walk comfortably? has walked a 1/4 mile but it took 2 hours    Diagnostic tests lumbar x-ray: recent Facet hypertrophy at L4-5 and L5-S1.  Aortic atherosclerosis    Currently in Pain? No/denies                [facility] PT Assessment - 02/08/21 0001       Assessment   Medical Diagnosis genralized weakness/ Decreased endurance/ general debility    Referring Provider (PT) tiffiney    Onset Date/Surgical Date --   increasing over the past 1/5 years   Hand Dominance Right    Next MD Visit gets checked every few months    Prior Therapy Had 1 visit last  year      Precautions   Precautions Fall    Precaution Comments 3 falls recently. Legs just give out. Many near falls      Restrictions   Weight Bearing Restrictions No      Balance Screen   Has the patient fallen in the past 6 months Yes    How many times? about 3 times    Has the patient had a decrease in activity level because of a fear of falling?  Yes    Is the patient reluctant to leave their home because of a fear of falling?  Yes      Home Environment   Additional Comments no steps or stairs      Prior Function   Level of Independence Requires assistive device for independence;Needs assistance with ADLs   rolling walker but also has a cane   Vocation Retired    Leisure likes to be social but hasn;t been able to      [employer]   Overall Cognitive Status Within Functional Limits for tasks assessed    Attention Focused     Focused Attention Appears intact    Memory Appears intact    Awareness Appears intact    Problem Solving Appears intact      Sensation   Light Touch Appears Intact    Additional Comments bilateral LE numbness      Coordination   Gross Motor Movements are Fluid and Coordinated Yes    Fine Motor Movements are Fluid and Coordinated Yes      ROM / Strength   AROM / PROM / Strength AROM;PROM;Strength      Strength   Overall Strength Comments 4/5 gorss UE    Strength Assessment Site Shoulder;Hip;Hand    Right/Left hand Right;Left    Right Hand Grip (lbs) 40    Left Hand Grip (lbs) 30    Right/Left Hip Right;Left    Right Hip Flexion 3+/5    Right Hip ABduction 3+/5    Left Hip Flexion 3/5    Left Hip ABduction 3+/5      Palpation   Palpation comment a dense area noted in his right medial calf      Transfers   Five time sit to stand comments  with walker - 3 in 25 seconds      Ambulation/Gait   Gait Comments flexed trunk; slow steps; limited distance      High Level Balance   High Level Balance Comments narrow base min assist narrow base eyes closed mod a ; tandem mod a. Not safe to do tandem eyes closed                        Objective measurements completed on examination: See above findings.       [facility] Adult PT Treatment/Exercise - 02/08/21 0001       Exercises   Exercises Knee/Hip;Lumbar      Lumbar Exercises: Seated   Other Seated Lumbar Exercises sitting abduction 1x10, sitting marching 1x10,      Knee/Hip Exercises: Seated   Other Seated Knee/Hip Exercises LAQ 2x10yellow; march 2x10 yellow; clamshell 2x10 yellow                    PT Education - 02/08/21 1337     Education Details HEP and symptom mangement    Person(s) Educated Patient    Methods Explanation;Demonstration;Tactile cues;Verbal cues    Comprehension Verbalized understanding;Returned demonstration;Verbal cues required;Tactile  cues required              PT  Short Term Goals - 02/08/21 1352       PT SHORT TERM GOAL #1   Title Patient will perfrom all 5 stands with sit to stand test    Time 4    Period Weeks    Status New    Target Date 03/08/21      PT SHORT TERM GOAL #2   Title Patient will increase bilateral LE strength to 4/5    Time 4    Period Weeks    Status New    Target Date 03/08/21      PT SHORT TERM GOAL #3   Title Patients will perfrom narrow base of support with SBA    Time 4    Period Weeks    Status New    Target Date 03/08/21               PT Long Term Goals - 02/08/21 1400       PT LONG TERM GOAL #1   Title Patient will ambualte 1000' on 6 min walk test in order to improve ability to ambualte int he community    Time 8    Period Weeks    Status New    Target Date 04/05/21      PT LONG TERM GOAL #2   Title Patient will demonstrate a 30 on the BERG balance scale    Time 6    Period Weeks    Status New    Target Date 03/22/21      PT LONG TERM GOAL #3   Title Patient wil be independent with a gym program to improve strength and endurance    Time 6    Period Weeks    Status New    Target Date 03/22/21                    Plan - 02/08/21 1058     Clinical Impression Statement Patient is a 65 year old male with progress loss of mobility beggining with an MI in 2017. Over the past year and a half his mobility has decreased progressivley. He had a fall last week. he feels like his legs just gave out. He was active prior to loss of strength. At this time he presents with decreased UE and LE strength. He is unable to complete a 5x sit to stand test. We will perfrom a 6 min walk test and BERG balance test next visit. We were unable today 2nd to extensive history. He has decreased balance with narrow base and tandem stance, both of which were limited but more so with eyes cloased. He becomes fatigued with basic ADL's. He is motivated to increase his endurance and return to an active lifestyle.     Personal Factors and Comorbidities Fitness;Past/Current Experience;Comorbidity 3+;Time since onset of injury/illness/exacerbation    Comorbidities DMII with neuropathy, CAD, left knee pain at times    Examination-Activity Limitations Bed Mobility;Carry;Squat;Sit;Stairs;Lift;Locomotion Level;Other   driving   Examination-Participation Restrictions Cleaning;Community Activity;Driving;Laundry;Shop    Stability/Clinical Decision Making Evolving/Moderate complexity    Clinical Decision Making High    Rehab Potential Good    PT Frequency 2x / week    PT Duration 8 weeks    PT Treatment/Interventions ADLs/Self Care Home Management;Functional mobility training;Therapeutic activities;Therapeutic exercise;Neuromuscular re-education;Gait training;Stair training;Patient/family education;Manual techniques;Passive range of motion    PT Next Visit Plan BERG and 6 min walk  but may not tolerate in the same session. When those are done begin interval training on the nu-step; UE strengthening series; balance exercises in the bar as tolerated.    PT Home Exercise Plan [facility]    Consulted and Agree with Plan of Care Patient             Patient will benefit from skilled therapeutic intervention in order to improve the following deficits and impairments:  Abnormal gait, Difficulty walking, Decreased endurance, Increased muscle spasms, Pain, Decreased activity tolerance, Decreased balance, Decreased mobility, Decreased strength  Visit Diagnosis: Other abnormalities of gait and mobility  Difficulty in walking, not elsewhere classified  Muscle weakness (generalized)     Problem List Patient Active Problem List   Diagnosis Date Noted   Adjustment disorder 01/22/2021   Chronic back pain 10/22/2020   Peripheral vertigo 05/15/2020   Dermatitis 01/04/2020   Unintentional weight loss with loose stools 05/19/2019   Low vitamin B12 level 03/29/2019   Heme positive stool 03/14/2019   Normocytic anemia  03/14/2019   Chronic diastolic heart failure ([place]) 02/08/2019   Diabetic retinopathy ([place]) 09/08/2018   Physical debility 05/24/2018   Varicose veins of both lower extremities 03/01/2018   Fatigue due to exertion 10/14/2017   GERD (gastroesophageal reflux disease) 08/25/2017   CAD in native artery 04/14/2017   Hyperlipidemia associated with type 2 diabetes mellitus ([place]) 04/14/2017   Diabetic peripheral neuropathy associated with type 2 diabetes mellitus ([place]) 08/17/2016   T2DM (type 2 diabetes mellitus) ([place]) 05/02/2016   Hypertension associated with diabetes ([place]) 05/02/2016    Carney Living PT DPT  02/08/2021, 2:12 PM  Davis Gourd  02/08/2021   During this treatment session, the therapist was present, participating in and directing the treatment.   [facility] [address] [place], [place], [address] Phone: [phone]   Fax:  [phone]  Name: Roel Douthat MRN: [id number] Date of Birth: 08/25/1955

## 2021-02-08 NOTE — Patient Instructions (Signed)
Access Code: [facility] URL: [email or web address] Date: 02/08/2021 Prepared by: Carolyne Littles  Exercises Seated Hip Abduction with Resistance - 1 x daily - 7 x weekly - 3 sets - 10 reps Seated March with Resistance - 1 x daily - 7 x weekly - 3 sets - 10 reps Seated Long Arc Quad - 1 x daily - 7 x weekly - 3 sets - 10 reps

## 2021-02-15 ENCOUNTER — Other Ambulatory Visit: Payer: Self-pay

## 2021-02-15 ENCOUNTER — Ambulatory Visit (HOSPITAL_BASED_OUTPATIENT_CLINIC_OR_DEPARTMENT_OTHER): Payer: HMO | Admitting: Physical Therapy

## 2021-02-15 DIAGNOSIS — R2689 Other abnormalities of gait and mobility: Secondary | ICD-10-CM

## 2021-02-15 DIAGNOSIS — R262 Difficulty in walking, not elsewhere classified: Secondary | ICD-10-CM

## 2021-02-15 DIAGNOSIS — M6281 Muscle weakness (generalized): Secondary | ICD-10-CM

## 2021-02-17 ENCOUNTER — Encounter: Payer: Self-pay | Admitting: Family Medicine

## 2021-02-17 ENCOUNTER — Encounter (HOSPITAL_BASED_OUTPATIENT_CLINIC_OR_DEPARTMENT_OTHER): Payer: Self-pay | Admitting: Physical Therapy

## 2021-02-17 NOTE — Therapy (Signed)
[facility] [address] [place], [place], [address] Phone: [phone]   Fax:  [phone]  Physical Therapy Treatment  Patient Details  Name: Jeffrey [name] MRN: [id number] Date of Birth: 1956/05/15 Referring Provider (PT): tiffiney   Encounter Date: 02/15/2021   PT End of Session - 02/17/21 0911     Visit Number 2    Number of Visits 16    Date for PT Re-Evaluation 04/05/21    Authorization Type HTA 2022 30 dollars    PT Start Time 1600    PT Stop Time 3419    PT Time Calculation (min) 42 min    Activity Tolerance Patient tolerated treatment well    Behavior During Therapy [facility] for tasks assessed/performed             Past Medical History:  Diagnosis Date   Cardiac arrest ([place]) 05/22/2016   Cataract    CHF (congestive heart failure) ([place])    Diabetes mellitus without complication ([place])    type 2   Diabetic retinopathy ([place])    Hyperlipidemia    Hypertension    Memory loss    mild   Myocardial infarct ([place]) 2105   Retinopathy    Both eyes   S/P primary angioplasty with coronary stent 05/22/2016   Vitamin B12 deficiency    Vitreous hemorrhage of left eye ([place])    proliferative diabetic retinopathy    Past Surgical History:  Procedure Laterality Date   ANGIOPLASTY     2015   COLONOSCOPY     multiple   EYE SURGERY     PARS PLANA VITRECTOMY Left 03/15/2020   Procedure: PARS PLANA VITRECTOMY WITH 25 GAUGE;  Surgeon: [name], MD;  Location: [facility];  Service: Ophthalmology;  Laterality: Left;   PHOTOCOAGULATION WITH LASER Left 03/15/2020   Procedure: PHOTOCOAGULATION WITH LASER; INTRAVITREAL INJECTION OF AVASTIN;  Surgeon: [name], MD;  Location: [facility];  Service: Ophthalmology;  Laterality: Left;   REFRACTIVE SURGERY     UPPER GI ENDOSCOPY     several   VITRECTOMY      There were no vitals filed for this visit.   Subjective Assessment - 02/17/21 0909     Subjective Patient took a shower  and shaved today. he reports it took him 2 hours and made him very fatigued. He ha tried the exercise but her reports there may be too many reps.    Pertinent History CAD; MI 2017; DMII with peripheral neuropathy    How long can you stand comfortably? depends on the day    How long can you walk comfortably? has walked a 1/4 mile but it took 2 hours    Diagnostic tests lumbar x-ray: recent Facet hypertrophy at L4-5 and L5-S1.  Aortic atherosclerosis    Currently in Pain? No/denies                [facility] PT Assessment - 02/17/21 0001       Standardized Balance Assessment   Standardized Balance Assessment Berg Balance Test      Berg Balance Test   Sit to Stand Needs minimal aid to stand or to stabilize    Standing Unsupported Able to stand 2 minutes with supervision    Sitting with Back Unsupported but Feet Supported on Floor or Stool Able to sit safely and securely 2 minutes    Stand to Sit Uses backs of legs against chair to control descent    Transfers Able to transfer with verbal cueing  and /or supervision    Standing Unsupported with Eyes Closed Able to stand 3 seconds    Standing Unsupported with Feet Together Needs help to attain position but able to stand for 30 seconds with feet together    From Standing, Reach Forward with Outstretched Arm Can reach forward >5 cm safely (2")    From Standing Position, Pick up Object from Floor Unable to try/needs assist to keep balance    From Standing Position, Turn to Look Behind Over each Shoulder Needs supervision when turning    Turn 360 Degrees Needs assistance while turning    Standing Unsupported, Alternately Place Feet on Step/Stool Able to complete >2 steps/needs minimal assist    Standing Unsupported, One Foot in Front Loses balance while stepping or standing    Standing on One Leg Unable to try or needs assist to prevent fall    Total Score 19    Berg comment: High fall risk                           [facility]  Adult PT Treatment/Exercise - 02/17/21 0001       Transfers   Five time sit to stand comments  with walker - 3 in 25 seconds      Ambulation/Gait   Gait Comments flexed trunk; slow steps; limited distance      Exercises   Exercises Knee/Hip;Lumbar      Lumbar Exercises: Seated   Other Seated Lumbar Exercises sitting abduction 1x10, sitting marching 1x10,    Other Seated Lumbar Exercises bilateral er yellow x10      Knee/Hip Exercises: Seated   Other Seated Knee/Hip Exercises LAQ 2x10yellow; march 2x10 yellow; clamshell 2x10 yellow                    PT Education - 02/17/21 0911     Education Details updated HEP    Person(s) Educated Patient    Methods Explanation;Demonstration;Verbal cues;Tactile cues    Comprehension Verbalized understanding;Returned demonstration;Verbal cues required;Tactile cues required              PT Short Term Goals - 02/08/21 1352       PT SHORT TERM GOAL #1   Title Patient will perfrom all 5 stands with sit to stand test    Time 4    Period Weeks    Status New    Target Date 03/08/21      PT SHORT TERM GOAL #2   Title Patient will increase bilateral LE strength to 4/5    Time 4    Period Weeks    Status New    Target Date 03/08/21      PT SHORT TERM GOAL #3   Title Patients will perfrom narrow base of support with SBA    Time 4    Period Weeks    Status New    Target Date 03/08/21               PT Long Term Goals - 02/08/21 1400       PT LONG TERM GOAL #1   Title Patient will ambualte 1000' on 6 min walk test in order to improve ability to ambualte int he community    Time 8    Period Weeks    Status New    Target Date 04/05/21      PT LONG TERM GOAL #2   Title Patient will demonstrate a 30 on  the BERG balance scale    Time 6    Period Weeks    Status New    Target Date 03/22/21      PT LONG TERM GOAL #3   Title Patient wil be independent with a gym program to improve strength and endurance    Time  6    Period Weeks    Status New    Target Date 03/22/21                   Plan - 02/17/21 0911     Clinical Impression Statement Per BERG balance testing the patient is a high fall risk. he scored a 61. He perfromed intervals on the nu-step with monitoring of HR and dyspnea. He tolerated well. therapy reviewed HPE. His sets and reps were reduced but he was encoruaged to at least try to build up to more reps and maybe do the exercises 2x a day. We will assess 6 min walk test next visit.    Personal Factors and Comorbidities Fitness;Past/Current Experience;Comorbidity 3+;Time since onset of injury/illness/exacerbation    Comorbidities DMII with neuropathy, CAD, left knee pain at times    Examination-Activity Limitations Bed Mobility;Carry;Squat;Sit;Stairs;Lift;Locomotion Level;Other    Examination-Participation Restrictions Cleaning;Community Activity;Driving;Laundry;Shop    Stability/Clinical Decision Making Evolving/Moderate complexity    Clinical Decision Making High    Rehab Potential Good    PT Frequency 2x / week    PT Duration 8 weeks    PT Treatment/Interventions ADLs/Self Care Home Management;Functional mobility training;Therapeutic activities;Therapeutic exercise;Neuromuscular re-education;Gait training;Stair training;Patient/family education;Manual techniques;Passive range of motion    PT Next Visit Plan BERG and 6 min walk but may not tolerate in the same session. When those are done begin interval training on the nu-step; UE strengthening series; balance exercises in the bar as tolerated.    PT Home Exercise Plan [facility]    Consulted and Agree with Plan of Care Patient             Patient will benefit from skilled therapeutic intervention in order to improve the following deficits and impairments:  Abnormal gait, Difficulty walking, Decreased endurance, Increased muscle spasms, Pain, Decreased activity tolerance, Decreased balance, Decreased mobility, Decreased  strength  Visit Diagnosis: Other abnormalities of gait and mobility  Difficulty in walking, not elsewhere classified  Muscle weakness (generalized)     Problem List Patient Active Problem List   Diagnosis Date Noted   Adjustment disorder 01/22/2021   Chronic back pain 10/22/2020   Peripheral vertigo 05/15/2020   Dermatitis 01/04/2020   Unintentional weight loss with loose stools 05/19/2019   Low vitamin B12 level 03/29/2019   Heme positive stool 03/14/2019   Normocytic anemia 03/14/2019   Chronic diastolic heart failure ([place]) 02/08/2019   Diabetic retinopathy ([place]) 09/08/2018   Physical debility 05/24/2018   Varicose veins of both lower extremities 03/01/2018   Fatigue due to exertion 10/14/2017   GERD (gastroesophageal reflux disease) 08/25/2017   CAD in native artery 04/14/2017   Hyperlipidemia associated with type 2 diabetes mellitus ([place]) 04/14/2017   Diabetic peripheral neuropathy associated with type 2 diabetes mellitus ([place]) 08/17/2016   T2DM (type 2 diabetes mellitus) ([place]) 05/02/2016   Hypertension associated with diabetes ([place]) 05/02/2016    Carney Living PT DPT  02/17/2021, 9:23 AM  [facility] [address] [place], [place], [address] Phone: [phone]   Fax:  [phone]  Name: Jeffrey [name] MRN: [id number] Date of Birth: 1955/10/16

## 2021-02-18 ENCOUNTER — Encounter (HOSPITAL_BASED_OUTPATIENT_CLINIC_OR_DEPARTMENT_OTHER): Payer: Self-pay | Admitting: Physical Therapy

## 2021-02-18 ENCOUNTER — Other Ambulatory Visit: Payer: Self-pay

## 2021-02-18 ENCOUNTER — Ambulatory Visit (HOSPITAL_BASED_OUTPATIENT_CLINIC_OR_DEPARTMENT_OTHER): Payer: HMO | Admitting: Physical Therapy

## 2021-02-18 DIAGNOSIS — R2689 Other abnormalities of gait and mobility: Secondary | ICD-10-CM | POA: Diagnosis not present

## 2021-02-18 DIAGNOSIS — M6281 Muscle weakness (generalized): Secondary | ICD-10-CM

## 2021-02-18 DIAGNOSIS — R262 Difficulty in walking, not elsewhere classified: Secondary | ICD-10-CM

## 2021-02-18 NOTE — Therapy (Signed)
[facility] [address] [place], [place], [address] Phone: [phone]   Fax:  [phone]  Physical Therapy Treatment  Patient Details  Name: Jeffrey [name] MRN: [id number] Date of Birth: 07-Feb-1956 Referring Provider (PT): tiffiney   Encounter Date: 02/18/2021   PT End of Session - 02/18/21 1313     Visit Number 3    Number of Visits 16    Date for PT Re-Evaluation 04/05/21    Authorization Type HTA 2022 30 dollars    PT Start Time 5329    PT Stop Time 1228    PT Time Calculation (min) 43 min    Activity Tolerance Patient tolerated treatment well    Behavior During Therapy [facility] for tasks assessed/performed             Past Medical History:  Diagnosis Date   Cardiac arrest ([place]) 05/22/2016   Cataract    CHF (congestive heart failure) ([place])    Diabetes mellitus without complication ([place])    type 2   Diabetic retinopathy ([place])    Hyperlipidemia    Hypertension    Memory loss    mild   Myocardial infarct ([place]) 2105   Retinopathy    Both eyes   S/P primary angioplasty with coronary stent 05/22/2016   Vitamin B12 deficiency    Vitreous hemorrhage of left eye ([place])    proliferative diabetic retinopathy    Past Surgical History:  Procedure Laterality Date   ANGIOPLASTY     2015   COLONOSCOPY     multiple   EYE SURGERY     PARS PLANA VITRECTOMY Left 03/15/2020   Procedure: PARS PLANA VITRECTOMY WITH 25 GAUGE;  Surgeon: [name], MD;  Location: [place];  Service: Ophthalmology;  Laterality: Left;   PHOTOCOAGULATION WITH LASER Left 03/15/2020   Procedure: PHOTOCOAGULATION WITH LASER; INTRAVITREAL INJECTION OF AVASTIN;  Surgeon: [name], MD;  Location: [place];  Service: Ophthalmology;  Laterality: Left;   REFRACTIVE SURGERY     UPPER GI ENDOSCOPY     several   VITRECTOMY      There were no vitals filed for this visit.   Subjective Assessment - 02/18/21 1148     Subjective Patient reports feeling  the same. He felt pretty good after the last visit. He has done better with the exercises.    Pertinent History CAD; MI 2017; DMII with peripheral neuropathy    How long can you stand comfortably? depends on the day    How long can you walk comfortably? has walked a 1/4 mile but it took 2 hours    Diagnostic tests lumbar x-ray: recent Facet hypertrophy at L4-5 and L5-S1.  Aortic atherosclerosis    Currently in Pain? No/denies                               [facility] Adult PT Treatment/Exercise - 02/18/21 0001       Ambulation/Gait   Gait Comments 6 min walk: 123/75 77BPM  99% baseline: ambualted 3:20 before requing a 55 sec rest break HR 89BPM 99% sao2; end HR 97bpm and 94%. total distance 138'.      High Level Balance   High Level Balance Comments narrow base of support 2x20 sec eyes open and cloised with CGA; tandem stance 2x20 sec hold bilateral min-> mod a      Lumbar Exercises: Seated   Other Seated Lumbar Exercises sitting abduction 2x10, sitting marching  2x10, LAQ 2x10    Other Seated Lumbar Exercises bilateral er yellow 2x10      Knee/Hip Exercises: Seated   Other Seated Knee/Hip Exercises LAQ 2x10yellow; march 2x10 yellow; clamshell 2x10 yellow                    PT Education - 02/18/21 1311     Education Details reviewed HEP and symptom mangement    Person(s) Educated Patient    Methods Explanation;Tactile cues;Demonstration;Verbal cues    Comprehension Returned demonstration;Verbalized understanding;Verbal cues required;Tactile cues required              PT Short Term Goals - 02/18/21 1339       PT SHORT TERM GOAL #1   Title Patient will perfrom all 5 stands with sit to stand test    Time 4    Period Weeks    Status On-going    Target Date 03/08/21      PT SHORT TERM GOAL #2   Title Patient will increase bilateral LE strength to 4/5    Time 4    Period Weeks    Status On-going    Target Date 03/08/21      PT SHORT TERM GOAL  #3   Title Patients will perfrom narrow base of support with SBA    Time 4    Period Weeks    Status On-going    Target Date 03/08/21               PT Long Term Goals - 02/08/21 1400       PT LONG TERM GOAL #1   Title Patient will ambualte 1000' on 6 min walk test in order to improve ability to ambualte int he community    Time 8    Period Weeks    Status New    Target Date 04/05/21      PT LONG TERM GOAL #2   Title Patient will demonstrate a 30 on the BERG balance scale    Time 6    Period Weeks    Status New    Target Date 03/22/21      PT LONG TERM GOAL #3   Title Patient wil be independent with a gym program to improve strength and endurance    Time 6    Period Weeks    Status New    Target Date 03/22/21                   Plan - 02/18/21 1316     Clinical Impression Statement Patient performed 6 min walk test today. He ambualted 187 feet with 1 seated rest break. He tolerated ther-ex well. Therapy intitated balance training as well. He did well with narrow base but required min a with tandem stance with right foot forward and mod a with right foot back. Therapy kept his HEP consitent.    Personal Factors and Comorbidities Fitness;Past/Current Experience;Comorbidity 3+;Time since onset of injury/illness/exacerbation    Comorbidities DMII with neuropathy, CAD, left knee pain at times    Examination-Activity Limitations Bed Mobility;Carry;Squat;Sit;Stairs;Lift;Locomotion Level;Other    Examination-Participation Restrictions Cleaning;Community Activity;Driving;Laundry;Shop    Stability/Clinical Decision Making Evolving/Moderate complexity    Clinical Decision Making High    Rehab Potential Good    PT Frequency 2x / week    PT Duration 8 weeks    PT Treatment/Interventions ADLs/Self Care Home Management;Functional mobility training;Therapeutic activities;Therapeutic exercise;Neuromuscular re-education;Gait training;Stair training;Patient/family  education;Manual techniques;Passive range of motion  PT Next Visit Plan continue with balance training; continue to progress strengthein;    PT Home Exercise Plan [facility]    Consulted and Agree with Plan of Care Patient             Patient will benefit from skilled therapeutic intervention in order to improve the following deficits and impairments:  Abnormal gait, Difficulty walking, Decreased endurance, Increased muscle spasms, Pain, Decreased activity tolerance, Decreased balance, Decreased mobility, Decreased strength  Visit Diagnosis: Other abnormalities of gait and mobility  Difficulty in walking, not elsewhere classified  Muscle weakness (generalized)     Problem List Patient Active Problem List   Diagnosis Date Noted   Adjustment disorder 01/22/2021   Chronic back pain 10/22/2020   Peripheral vertigo 05/15/2020   Dermatitis 01/04/2020   Unintentional weight loss with loose stools 05/19/2019   Low vitamin B12 level 03/29/2019   Heme positive stool 03/14/2019   Normocytic anemia 03/14/2019   Chronic diastolic heart failure ([place]) 02/08/2019   Diabetic retinopathy ([place]) 09/08/2018   Physical debility 05/24/2018   Varicose veins of both lower extremities 03/01/2018   Fatigue due to exertion 10/14/2017   GERD (gastroesophageal reflux disease) 08/25/2017   CAD in native artery 04/14/2017   Hyperlipidemia associated with type 2 diabetes mellitus ([place]) 04/14/2017   Diabetic peripheral neuropathy associated with type 2 diabetes mellitus ([place]) 08/17/2016   T2DM (type 2 diabetes mellitus) ([place]) 05/02/2016   Hypertension associated with diabetes ([place]) 05/02/2016    Carney Living PT DPT  02/18/2021, 1:42 PM  [facility] [address] [place], [place], [address] Phone: [phone]   Fax:  [phone]  Name: Jeffrey [name] MRN: [id number] Date of Birth: 1955/09/21

## 2021-02-19 ENCOUNTER — Other Ambulatory Visit: Payer: Self-pay

## 2021-02-19 DIAGNOSIS — Z794 Long term (current) use of insulin: Secondary | ICD-10-CM

## 2021-02-19 DIAGNOSIS — E1159 Type 2 diabetes mellitus with other circulatory complications: Secondary | ICD-10-CM

## 2021-02-19 DIAGNOSIS — I152 Hypertension secondary to endocrine disorders: Secondary | ICD-10-CM

## 2021-02-19 DIAGNOSIS — E1142 Type 2 diabetes mellitus with diabetic polyneuropathy: Secondary | ICD-10-CM

## 2021-02-20 ENCOUNTER — Other Ambulatory Visit: Payer: Self-pay

## 2021-02-20 ENCOUNTER — Encounter (HOSPITAL_BASED_OUTPATIENT_CLINIC_OR_DEPARTMENT_OTHER): Payer: Self-pay | Admitting: Physical Therapy

## 2021-02-20 ENCOUNTER — Ambulatory Visit (HOSPITAL_BASED_OUTPATIENT_CLINIC_OR_DEPARTMENT_OTHER): Payer: HMO | Admitting: Physical Therapy

## 2021-02-20 VITALS — BP 118/68 | HR 75

## 2021-02-20 DIAGNOSIS — R2689 Other abnormalities of gait and mobility: Secondary | ICD-10-CM

## 2021-02-20 DIAGNOSIS — R262 Difficulty in walking, not elsewhere classified: Secondary | ICD-10-CM

## 2021-02-20 DIAGNOSIS — M6281 Muscle weakness (generalized): Secondary | ICD-10-CM

## 2021-02-20 NOTE — Therapy (Signed)
[facility] [name] [place], [place], [address] Phone: [phone]   Fax:  [phone]  Physical Therapy Treatment  Patient Details  Name: Jeffrey [name] MRN: [id number] Date of Birth: Aug 10, 1956 Referring Provider (PT): tiffiney   Encounter Date: 02/20/2021   PT End of Session - 02/20/21 1259     Visit Number 4    Number of Visits 16    Date for PT Re-Evaluation 04/05/21    Authorization Type HTA 2022 30 dollars    PT Start Time 2831    PT Stop Time 1229    PT Time Calculation (min) 44 min    Equipment Utilized During Treatment Gait belt    Activity Tolerance Patient tolerated treatment well    Behavior During Therapy [facility] for tasks assessed/performed             Past Medical History:  Diagnosis Date   Cardiac arrest ([place]) 05/22/2016   Cataract    CHF (congestive heart failure) ([place])    Diabetes mellitus without complication ([place])    type 2   Diabetic retinopathy ([place])    Hyperlipidemia    Hypertension    Memory loss    mild   Myocardial infarct ([place]) 2105   Retinopathy    Both eyes   S/P primary angioplasty with coronary stent 05/22/2016   Vitamin B12 deficiency    Vitreous hemorrhage of left eye ([place])    proliferative diabetic retinopathy    Past Surgical History:  Procedure Laterality Date   ANGIOPLASTY     2015   COLONOSCOPY     multiple   EYE SURGERY     PARS PLANA VITRECTOMY Left 03/15/2020   Procedure: PARS PLANA VITRECTOMY WITH 25 GAUGE;  Surgeon: [name], MD;  Location: [place];  Service: Ophthalmology;  Laterality: Left;   PHOTOCOAGULATION WITH LASER Left 03/15/2020   Procedure: PHOTOCOAGULATION WITH LASER; INTRAVITREAL INJECTION OF AVASTIN;  Surgeon: [name], MD;  Location: [place];  Service: Ophthalmology;  Laterality: Left;   REFRACTIVE SURGERY     UPPER GI ENDOSCOPY     several   VITRECTOMY      Vitals:   02/20/21 1258  BP: 118/68  Pulse: 75     Subjective  Assessment - 02/20/21 1150     Subjective Baseline vitals : 75bom 118/68 B/P. Patient reports he is tired today. he didn;t sleep much last night. He flet pretty good sfter his last visit.    Patient is accompained by: Family member    Pertinent History CAD; MI 2017; DMII with peripheral neuropathy    How long can you stand comfortably? depends on the day    How long can you walk comfortably? has walked a 1/4 mile but it took 2 hours    Diagnostic tests lumbar x-ray: recent Facet hypertrophy at L4-5 and L5-S1.  Aortic atherosclerosis    Currently in Pain? No/denies                               [facility] Adult PT Treatment/Exercise - 02/20/21 0001       High Level Balance   High Level Balance Comments narrow base of support 2x20 sec eyes open and closed with CGA; tandem stance 2x20 sec hold bilateral min assist, 2step foward and back balance 2x15 secs      Exercises   Exercises Shoulder      Lumbar Exercises: Aerobic   Nustep 4 x  1 minute intervals with 20 sec rest, level 1      Lumbar Exercises: Seated   Other Seated Lumbar Exercises bilateral er yellow 2x10; cane flexion 2x10; horizontal abduction 2x10      Shoulder Exercises: Seated   Elevation Weight (lbs) 2 lbs    Elevation Limitations 3x10 to 90degrees of elevation    Row Strengthening;Both;10 reps;Theraband    Theraband Level (Shoulder Row) Level 2 (Red)    Row Weight (lbs) 2x10    Horizontal ABduction Strengthening;Both;10 reps    Theraband Level (Shoulder Horizontal ABduction) Level 2 (Red)    Horizontal ABduction Limitations 2x10    External Rotation Strengthening;Both;10 reps;Theraband    Theraband Level (Shoulder External Rotation) Level 2 (Red)    External Rotation Limitations 2x10                    PT Education - 02/20/21 1258     Education Details Reviewed HEP with added upper extremity exercises.    Person(s) Educated Patient    Methods Explanation;Demonstration;Tactile  cues;Verbal cues    Comprehension Returned demonstration;Verbalized understanding;Verbal cues required;Tactile cues required              PT Short Term Goals - 02/18/21 1339       PT SHORT TERM GOAL #1   Title Patient will perfrom all 5 stands with sit to stand test    Time 4    Period Weeks    Status On-going    Target Date 03/08/21      PT SHORT TERM GOAL #2   Title Patient will increase bilateral LE strength to 4/5    Time 4    Period Weeks    Status On-going    Target Date 03/08/21      PT SHORT TERM GOAL #3   Title Patients will perfrom narrow base of support with SBA    Time 4    Period Weeks    Status On-going    Target Date 03/08/21               PT Long Term Goals - 02/08/21 1400       PT LONG TERM GOAL #1   Title Patient will ambualte 1000' on 6 min walk test in order to improve ability to ambualte int he community    Time 8    Period Weeks    Status New    Target Date 04/05/21      PT LONG TERM GOAL #2   Title Patient will demonstrate a 30 on the BERG balance scale    Time 6    Period Weeks    Status New    Target Date 03/22/21      PT LONG TERM GOAL #3   Title Patient wil be independent with a gym program to improve strength and endurance    Time 6    Period Weeks    Status New    Target Date 03/22/21                   Plan - 02/20/21 1300     Clinical Impression Statement Patient did well today. He was able to do 4 1 min intevals with a minimal increase in heart rate. On the first visit he had more of an increase in heart rate with 30 sec intervals. We will have to continue to challenge him to get his HR into a zone to improve cardiovascular endurance. Therapy reviewed UE execises with  the patient. he was advised that one day he can do UE and one day he can do LE. He was also advised if he dosen't feel comfortable with the technique to come back and ttalk to therapy. He did well with balance exercises We will consider  progressing patient to air-ex if he continues to tolerate well.    Personal Factors and Comorbidities Fitness;Past/Current Experience;Comorbidity 3+;Time since onset of injury/illness/exacerbation    Comorbidities DMII with neuropathy, CAD, left knee pain at times    Examination-Activity Limitations Bed Mobility;Carry;Squat;Sit;Stairs;Lift;Locomotion Level;Other    Examination-Participation Restrictions Cleaning;Community Activity;Driving;Laundry;Shop    Stability/Clinical Decision Making Evolving/Moderate complexity    Clinical Decision Making High    Rehab Potential Good    PT Frequency 2x / week    PT Duration 8 weeks    PT Treatment/Interventions ADLs/Self Care Home Management;Functional mobility training;Therapeutic activities;Therapeutic exercise;Neuromuscular re-education;Gait training;Stair training;Patient/family education;Manual techniques;Passive range of motion    PT Next Visit Plan continue with balance training; continue to progress strengthein; review UE and LE HEP to increase intensity, Nu-step endurance 5 intervals of 1 minute at level 1    PT Home Exercise Plan [facility]    Consulted and Agree with Plan of Care Patient             Patient will benefit from skilled therapeutic intervention in order to improve the following deficits and impairments:  Abnormal gait, Difficulty walking, Decreased endurance, Increased muscle spasms, Pain, Decreased activity tolerance, Decreased balance, Decreased mobility, Decreased strength  Visit Diagnosis: Other abnormalities of gait and mobility  Difficulty in walking, not elsewhere classified  Muscle weakness (generalized)     Problem List Patient Active Problem List   Diagnosis Date Noted   Adjustment disorder 01/22/2021   Chronic back pain 10/22/2020   Peripheral vertigo 05/15/2020   Dermatitis 01/04/2020   Unintentional weight loss with loose stools 05/19/2019   Low vitamin B12 level 03/29/2019   Heme positive stool  03/14/2019   Normocytic anemia 03/14/2019   Chronic diastolic heart failure ([place]) 02/08/2019   Diabetic retinopathy ([place]) 09/08/2018   Physical debility 05/24/2018   Varicose veins of both lower extremities 03/01/2018   Fatigue due to exertion 10/14/2017   GERD (gastroesophageal reflux disease) 08/25/2017   CAD in native artery 04/14/2017   Hyperlipidemia associated with type 2 diabetes mellitus ([place]) 04/14/2017   Diabetic peripheral neuropathy associated with type 2 diabetes mellitus ([place]) 08/17/2016   T2DM (type 2 diabetes mellitus) ([place]) 05/02/2016   Hypertension associated with diabetes ([place]) 05/02/2016    Carney Living PT DPT  02/20/2021, 2:19 PM  Davis Gourd SPT  02/20/2021  During this treatment session, the therapist was present, participating in and directing the treatment.   [facility] [address] [place], [place], [address] Phone: [phone]   Fax:  [phone]  Name: Neilan Rizzo MRN: [id number] Date of Birth: 05-Jul-1956

## 2021-02-25 ENCOUNTER — Encounter (HOSPITAL_BASED_OUTPATIENT_CLINIC_OR_DEPARTMENT_OTHER): Payer: Self-pay | Admitting: Physical Therapy

## 2021-02-25 ENCOUNTER — Ambulatory Visit (HOSPITAL_BASED_OUTPATIENT_CLINIC_OR_DEPARTMENT_OTHER): Payer: HMO | Admitting: Physical Therapy

## 2021-02-25 ENCOUNTER — Other Ambulatory Visit: Payer: Self-pay

## 2021-02-25 VITALS — BP 138/72 | HR 77

## 2021-02-25 DIAGNOSIS — R2689 Other abnormalities of gait and mobility: Secondary | ICD-10-CM | POA: Diagnosis not present

## 2021-02-25 DIAGNOSIS — R262 Difficulty in walking, not elsewhere classified: Secondary | ICD-10-CM

## 2021-02-25 DIAGNOSIS — M6281 Muscle weakness (generalized): Secondary | ICD-10-CM

## 2021-02-25 NOTE — Therapy (Signed)
[facility] [address] [place], [place], [address] Phone: [phone]   Fax:  [phone]  Physical Therapy Treatment  Patient Details  Name: Jeffrey [name] MRN: [id number] Date of Birth: August 01, 1956 Referring Provider (PT): tiffiney   Encounter Date: 02/25/2021   PT End of Session - 02/25/21 0932     Visit Number 5    Number of Visits 16    Date for PT Re-Evaluation 04/05/21    Authorization Type HTA 2022 30 dollars    PT Start Time 3557    PT Stop Time 1227    PT Time Calculation (min) 42 min    Activity Tolerance Patient tolerated treatment well    Behavior During Therapy [facility] for tasks assessed/performed             Past Medical History:  Diagnosis Date   Cardiac arrest ([place]) 05/22/2016   Cataract    CHF (congestive heart failure) ([place])    Diabetes mellitus without complication ([place])    type 2   Diabetic retinopathy ([place])    Hyperlipidemia    Hypertension    Memory loss    mild   Myocardial infarct ([place]) 2105   Retinopathy    Both eyes   S/P primary angioplasty with coronary stent 05/22/2016   Vitamin B12 deficiency    Vitreous hemorrhage of left eye ([place])    proliferative diabetic retinopathy    Past Surgical History:  Procedure Laterality Date   ANGIOPLASTY     2015   COLONOSCOPY     multiple   EYE SURGERY     PARS PLANA VITRECTOMY Left 03/15/2020   Procedure: PARS PLANA VITRECTOMY WITH 25 GAUGE;  Surgeon: [name], MD;  Location: [facility] [place];  Service: Ophthalmology;  Laterality: Left;   PHOTOCOAGULATION WITH LASER Left 03/15/2020   Procedure: PHOTOCOAGULATION WITH LASER; INTRAVITREAL INJECTION OF AVASTIN;  Surgeon: [name], MD;  Location: [place];  Service: Ophthalmology;  Laterality: Left;   REFRACTIVE SURGERY     UPPER GI ENDOSCOPY     several   VITRECTOMY      Vitals:   02/25/21 1151  BP: 138/72  Pulse: 77     Subjective Assessment - 02/25/21 1202     Subjective Patient had  no AC over the weekend. He reports his apartment temp got up to 87 degrees. He was unable to do exercises or sleep.    Pertinent History CAD; MI 2017; DMII with peripheral neuropathy    How long can you stand comfortably? depends on the day    How long can you walk comfortably? has walked a 1/4 mile but it took 2 hours    Diagnostic tests lumbar x-ray: recent Facet hypertrophy at L4-5 and L5-S1.  Aortic atherosclerosis    Currently in Pain? No/denies                               [facility] Adult PT Treatment/Exercise - 02/25/21 1653       High Level Balance   High Level Balance Comments rhomberg balance 1 sets by 30 secs eyes open, 1 set eyes closed 20 sec. Tandem stance: (2x30 secs for each foot infront) min assit, small step front and back for dynamic balance 2x8 min/mod-assit      Lumbar Exercises: Aerobic   Nustep 5 x 1 minute intervals with 10 sec rest, level 2      Lumbar Exercises: Seated   Other Seated  Lumbar Exercises seated abduction 2x10, sitting marching 2x10, LAQ 2x10 all with red band.      Shoulder Exercises: Seated   Row Strengthening;Both;10 reps;Theraband    Theraband Level (Shoulder Row) Level 2 (Red)    Row Weight (lbs) 2x10    Horizontal ABduction Strengthening;Both;10 reps    Theraband Level (Shoulder Horizontal ABduction) Level 2 (Red)    Horizontal ABduction Limitations 2x10    External Rotation Strengthening;Both;10 reps;Theraband    Theraband Level (Shoulder External Rotation) Level 2 (Red)    External Rotation Limitations 2x10                    PT Education - 02/25/21 1203     Education Details reviewed home exercises    Person(s) Educated Patient    Methods Explanation;Demonstration;Tactile cues;Verbal cues    Comprehension Verbalized understanding;Returned demonstration;Verbal cues required;Tactile cues required;Need further instruction              PT Short Term Goals - 02/18/21 1339       PT SHORT TERM GOAL #1    Title Patient will perfrom all 5 stands with sit to stand test    Time 4    Period Weeks    Status On-going    Target Date 03/08/21      PT SHORT TERM GOAL #2   Title Patient will increase bilateral LE strength to 4/5    Time 4    Period Weeks    Status On-going    Target Date 03/08/21      PT SHORT TERM GOAL #3   Title Patients will perfrom narrow base of support with SBA    Time 4    Period Weeks    Status On-going    Target Date 03/08/21               PT Long Term Goals - 02/08/21 1400       PT LONG TERM GOAL #1   Title Patient will ambualte 1000' on 6 min walk test in order to improve ability to ambualte int he community    Time 8    Period Weeks    Status New    Target Date 04/05/21      PT LONG TERM GOAL #2   Title Patient will demonstrate a 30 on the BERG balance scale    Time 6    Period Weeks    Status New    Target Date 03/22/21      PT LONG TERM GOAL #3   Title Patient wil be independent with a gym program to improve strength and endurance    Time 6    Period Weeks    Status New    Target Date 03/22/21                   Plan - 02/25/21 1222     Clinical Impression Statement Despite having a otugh weekend, the patient was able to tolerate therapy well. He had no major increase in fatgiue. He did well with balance exercises. He had no significant loss of balance. His HEP was kept consitent. He was encouraged to continue with current HEP. We were able to advance the level of his Nu-step. We will continue to advance balance exercises as tolerated as well.    Personal Factors and Comorbidities Fitness;Past/Current Experience;Comorbidity 3+;Time since onset of injury/illness/exacerbation    Comorbidities DMII with neuropathy, CAD, left knee pain at times    Examination-Activity Limitations Bed  Mobility;Carry;Squat;Sit;Stairs;Lift;Locomotion Level;Other    Examination-Participation Restrictions Cleaning;Community Activity;Driving;Laundry;Shop     Stability/Clinical Decision Making Evolving/Moderate complexity    Clinical Decision Making High    Rehab Potential Good    PT Frequency 2x / week    PT Duration 8 weeks    PT Treatment/Interventions ADLs/Self Care Home Management;Functional mobility training;Therapeutic activities;Therapeutic exercise;Neuromuscular re-education;Gait training;Stair training;Patient/family education;Manual techniques;Passive range of motion    PT Next Visit Plan continue with balance training; continue to progress strengthein; review UE and LE HEP to increase intensity, Nu-step endurance 5 intervals of 1 minute at level 1    PT Home Exercise Plan [facility]    Consulted and Agree with Plan of Care Patient             Patient will benefit from skilled therapeutic intervention in order to improve the following deficits and impairments:  Abnormal gait, Difficulty walking, Decreased endurance, Increased muscle spasms, Pain, Decreased activity tolerance, Decreased balance, Decreased mobility, Decreased strength  Visit Diagnosis: Other abnormalities of gait and mobility  Difficulty in walking, not elsewhere classified  Muscle weakness (generalized)     Problem List Patient Active Problem List   Diagnosis Date Noted   Adjustment disorder 01/22/2021   Chronic back pain 10/22/2020   Peripheral vertigo 05/15/2020   Dermatitis 01/04/2020   Unintentional weight loss with loose stools 05/19/2019   Low vitamin B12 level 03/29/2019   Heme positive stool 03/14/2019   Normocytic anemia 03/14/2019   Chronic diastolic heart failure ([place]) 02/08/2019   Diabetic retinopathy ([place]) 09/08/2018   Physical debility 05/24/2018   Varicose veins of both lower extremities 03/01/2018   Fatigue due to exertion 10/14/2017   GERD (gastroesophageal reflux disease) 08/25/2017   CAD in native artery 04/14/2017   Hyperlipidemia associated with type 2 diabetes mellitus ([place]) 04/14/2017   Diabetic peripheral neuropathy  associated with type 2 diabetes mellitus ([place]) 08/17/2016   T2DM (type 2 diabetes mellitus) ([place]) 05/02/2016   Hypertension associated with diabetes ([place]) 05/02/2016    Carney Living DPT  02/26/2021, 8:21 AM  Darrel Hoover [facility]  02/26/2021  During this treatment session, the therapist was present, participating in and directing the treatment.   [facility] [address] [place], [place], [address] Phone: [phone]   Fax:  [phone]  Name: Jeffrey [name] MRN: [id number] Date of Birth: 22-Jan-1956

## 2021-02-26 ENCOUNTER — Encounter (HOSPITAL_BASED_OUTPATIENT_CLINIC_OR_DEPARTMENT_OTHER): Payer: Self-pay | Admitting: Physical Therapy

## 2021-02-27 ENCOUNTER — Ambulatory Visit (HOSPITAL_BASED_OUTPATIENT_CLINIC_OR_DEPARTMENT_OTHER): Payer: HMO | Admitting: Physical Therapy

## 2021-02-28 ENCOUNTER — Encounter (HOSPITAL_BASED_OUTPATIENT_CLINIC_OR_DEPARTMENT_OTHER): Payer: Self-pay | Admitting: Physical Therapy

## 2021-02-28 ENCOUNTER — Ambulatory Visit (HOSPITAL_BASED_OUTPATIENT_CLINIC_OR_DEPARTMENT_OTHER): Payer: HMO | Admitting: Physical Therapy

## 2021-02-28 ENCOUNTER — Other Ambulatory Visit: Payer: Self-pay

## 2021-02-28 ENCOUNTER — Telehealth: Payer: Self-pay | Admitting: Family Medicine

## 2021-02-28 DIAGNOSIS — R2689 Other abnormalities of gait and mobility: Secondary | ICD-10-CM

## 2021-02-28 DIAGNOSIS — M6281 Muscle weakness (generalized): Secondary | ICD-10-CM

## 2021-02-28 DIAGNOSIS — R262 Difficulty in walking, not elsewhere classified: Secondary | ICD-10-CM

## 2021-02-28 NOTE — Chronic Care Management (AMB) (Signed)
  Chronic Care Management   Outreach Note  02/28/2021 Name: Jeffrey [name] MRN: [id number] DOB: 06/07/1956  Referred by: Vivi Barrack, MD Reason for referral : No chief complaint on file.   Third unsuccessful telephone outreach was attempted today. The patient was referred to the pharmacist for assistance with care management and care coordination.   Follow Up Plan:   Lauretta Grill [facility] Scheduler

## 2021-03-01 ENCOUNTER — Encounter (HOSPITAL_BASED_OUTPATIENT_CLINIC_OR_DEPARTMENT_OTHER): Payer: Self-pay | Admitting: Physical Therapy

## 2021-03-01 NOTE — Therapy (Signed)
[facility] [address] [place], [place], [address] Phone: [phone]   Fax:  [phone]  Physical Therapy Treatment  Patient Details  Name: Jeffrey [name] MRN: [id number] Date of Birth: 10/28/1955 Referring Provider (PT): tiffiney   Encounter Date: 02/28/2021   PT End of Session - 02/28/21 1334     Visit Number 6    Number of Visits 16    Date for PT Re-Evaluation 04/05/21    Authorization Type HTA 2022 30 dollars    PT Start Time 1315    PT Stop Time [id number]    PT Time Calculation (min) 44 min    Equipment Utilized During Treatment Gait belt    Activity Tolerance Patient tolerated treatment well    Behavior During Therapy [facility] for tasks assessed/performed             Past Medical History:  Diagnosis Date   Cardiac arrest ([place]) 05/22/2016   Cataract    CHF (congestive heart failure) ([place])    Diabetes mellitus without complication ([place])    type 2   Diabetic retinopathy ([place])    Hyperlipidemia    Hypertension    Memory loss    mild   Myocardial infarct ([place]) 2105   Retinopathy    Both eyes   S/P primary angioplasty with coronary stent 05/22/2016   Vitamin B12 deficiency    Vitreous hemorrhage of left eye ([place])    proliferative diabetic retinopathy    Past Surgical History:  Procedure Laterality Date   ANGIOPLASTY     2015   COLONOSCOPY     multiple   EYE SURGERY     PARS PLANA VITRECTOMY Left 03/15/2020   Procedure: PARS PLANA VITRECTOMY WITH 25 GAUGE;  Surgeon: [name], MD;  Location: [facility];  Service: Ophthalmology;  Laterality: Left;   PHOTOCOAGULATION WITH LASER Left 03/15/2020   Procedure: PHOTOCOAGULATION WITH LASER; INTRAVITREAL INJECTION OF AVASTIN;  Surgeon: [name], MD;  Location: [facility];  Service: Ophthalmology;  Laterality: Left;   REFRACTIVE SURGERY     UPPER GI ENDOSCOPY     several   VITRECTOMY      There were no vitals filed for this visit.   Subjective Assessment -  02/28/21 1321     Subjective Patient stated that he felt tired on Wednesday and could not make it to his session. Patient reported that he feels much better on day of visit. "Night and day difference."    Patient is accompained by: Family member    Pertinent History CAD; MI 2017; DMII with peripheral neuropathy    How long can you stand comfortably? depends on the day    How long can you walk comfortably? has walked a 1/4 mile but it took 2 hours    Diagnostic tests lumbar x-ray: recent Facet hypertrophy at L4-5 and L5-S1.  Aortic atherosclerosis    Currently in Pain? No/denies    Multiple Pain Sites No                                       PT Education - 02/28/21 1323     Education Details Reviewed home exercises and updated them.    Person(s) Educated Patient    Methods Explanation;Demonstration;Tactile cues;Verbal cues    Comprehension Verbalized understanding;Returned demonstration;Verbal cues required;Tactile cues required              PT Short Term  Goals - 02/18/21 1339       PT SHORT TERM GOAL #1   Title Patient will perfrom all 5 stands with sit to stand test    Time 4    Period Weeks    Status On-going    Target Date 03/08/21      PT SHORT TERM GOAL #2   Title Patient will increase bilateral LE strength to 4/5    Time 4    Period Weeks    Status On-going    Target Date 03/08/21      PT SHORT TERM GOAL #3   Title Patients will perfrom narrow base of support with SBA    Time 4    Period Weeks    Status On-going    Target Date 03/08/21               PT Long Term Goals - 02/08/21 1400       PT LONG TERM GOAL #1   Title Patient will ambualte 1000' on 6 min walk test in order to improve ability to ambualte int he community    Time 8    Period Weeks    Status New    Target Date 04/05/21      PT LONG TERM GOAL #2   Title Patient will demonstrate a 30 on the BERG balance scale    Time 6    Period Weeks    Status New     Target Date 03/22/21      PT LONG TERM GOAL #3   Title Patient wil be independent with a gym program to improve strength and endurance    Time 6    Period Weeks    Status New    Target Date 03/22/21                   Plan - 02/28/21 1335     Clinical Impression Statement Patient tolerated treatment well. He no major increase in fatigue. Patient is continueing to progress in his strength. His HEP was updated to include a green band for lower extremity exercises. Patient was also given standing lower extremity excercises (hip 3 way) with safety cues (chair behind him). Patient should continue to advance thera-ex as tolerated.    Personal Factors and Comorbidities Fitness;Past/Current Experience;Comorbidity 3+;Time since onset of injury/illness/exacerbation    Comorbidities DMII with neuropathy, CAD, left knee pain at times    Examination-Activity Limitations Bed Mobility;Carry;Squat;Sit;Stairs;Lift;Locomotion Level;Other    Examination-Participation Restrictions Cleaning;Community Activity;Driving;Laundry;Shop    Stability/Clinical Decision Making Evolving/Moderate complexity    Clinical Decision Making High    Rehab Potential Good    PT Frequency 2x / week    PT Duration 8 weeks    PT Treatment/Interventions ADLs/Self Care Home Management;Functional mobility training;Therapeutic activities;Therapeutic exercise;Neuromuscular re-education;Gait training;Stair training;Patient/family education;Manual techniques;Passive range of motion    PT Next Visit Plan continue with balance training; continue to progress strengthein; review UE and LE HEP to increase intensity, Nu-step endurance 5 intervals of 1 minute at level 1    PT Home Exercise Plan [facility]    Consulted and Agree with Plan of Care Patient             Patient will benefit from skilled therapeutic intervention in order to improve the following deficits and impairments:  Abnormal gait, Difficulty walking, Decreased  endurance, Increased muscle spasms, Pain, Decreased activity tolerance, Decreased balance, Decreased mobility, Decreased strength  Visit Diagnosis: Other abnormalities of gait and mobility  Difficulty in walking,  not elsewhere classified  Muscle weakness (generalized)     Problem List Patient Active Problem List   Diagnosis Date Noted   Adjustment disorder 01/22/2021   Chronic back pain 10/22/2020   Peripheral vertigo 05/15/2020   Dermatitis 01/04/2020   Unintentional weight loss with loose stools 05/19/2019   Low vitamin B12 level 03/29/2019   Heme positive stool 03/14/2019   Normocytic anemia 03/14/2019   Chronic diastolic heart failure ([place]) 02/08/2019   Diabetic retinopathy ([place]) 09/08/2018   Physical debility 05/24/2018   Varicose veins of both lower extremities 03/01/2018   Fatigue due to exertion 10/14/2017   GERD (gastroesophageal reflux disease) 08/25/2017   CAD in native artery 04/14/2017   Hyperlipidemia associated with type 2 diabetes mellitus ([place]) 04/14/2017   Diabetic peripheral neuropathy associated with type 2 diabetes mellitus ([place]) 08/17/2016   T2DM (type 2 diabetes mellitus) ([place]) 05/02/2016   Hypertension associated with diabetes ([place]) 05/02/2016    Carney Living PT DPT  03/01/2021, 9:30 AM  [facility] SPT  03/01/2021   During this treatment session, the therapist was present, participating in and directing the treatment.   [facility] [address] [place], [place], [address] Phone: [phone]   Fax:  [phone]  Name: Jeffrey [name] MRN: [id number] Date of Birth: 1955/09/08

## 2021-03-04 ENCOUNTER — Other Ambulatory Visit: Payer: Self-pay

## 2021-03-04 ENCOUNTER — Encounter (HOSPITAL_BASED_OUTPATIENT_CLINIC_OR_DEPARTMENT_OTHER): Payer: Self-pay | Admitting: Physical Therapy

## 2021-03-04 ENCOUNTER — Ambulatory Visit (HOSPITAL_BASED_OUTPATIENT_CLINIC_OR_DEPARTMENT_OTHER): Payer: HMO | Admitting: Physical Therapy

## 2021-03-04 DIAGNOSIS — R2689 Other abnormalities of gait and mobility: Secondary | ICD-10-CM

## 2021-03-04 DIAGNOSIS — M6281 Muscle weakness (generalized): Secondary | ICD-10-CM

## 2021-03-04 DIAGNOSIS — R262 Difficulty in walking, not elsewhere classified: Secondary | ICD-10-CM

## 2021-03-04 NOTE — Therapy (Signed)
[facility] [address] [place], [place], [address] Phone: [phone]   Fax:  [phone]  Physical Therapy Treatment  Patient Details  Name: Jeffrey [name] MRN: [id number] Date of Birth: 1956-01-17 Referring Provider (PT): tiffiney   Encounter Date: 03/04/2021   PT End of Session - 03/04/21 1346     Visit Number 7    Number of Visits 16    Date for PT Re-Evaluation 04/05/21    Authorization Type HTA 2022 30 dollars    PT Start Time 1148    PT Stop Time 1232    PT Time Calculation (min) 44 min    Activity Tolerance Patient tolerated treatment well    Behavior During Therapy [facility] for tasks assessed/performed             Past Medical History:  Diagnosis Date   Cardiac arrest ([place]) 05/22/2016   Cataract    CHF (congestive heart failure) ([place])    Diabetes mellitus without complication ([place])    type 2   Diabetic retinopathy ([place])    Hyperlipidemia    Hypertension    Memory loss    mild   Myocardial infarct ([place]) 2105   Retinopathy    Both eyes   S/P primary angioplasty with coronary stent 05/22/2016   Vitamin B12 deficiency    Vitreous hemorrhage of left eye ([place])    proliferative diabetic retinopathy    Past Surgical History:  Procedure Laterality Date   ANGIOPLASTY     2015   COLONOSCOPY     multiple   EYE SURGERY     PARS PLANA VITRECTOMY Left 03/15/2020   Procedure: PARS PLANA VITRECTOMY WITH 25 GAUGE;  Surgeon: [name], MD;  Location: [facility];  Service: Ophthalmology;  Laterality: Left;   PHOTOCOAGULATION WITH LASER Left 03/15/2020   Procedure: PHOTOCOAGULATION WITH LASER; INTRAVITREAL INJECTION OF AVASTIN;  Surgeon: [name], MD;  Location: [facility];  Service: Ophthalmology;  Laterality: Left;   REFRACTIVE SURGERY     UPPER GI ENDOSCOPY     several   VITRECTOMY      There were no vitals filed for this visit.   Subjective Assessment - 03/04/21 1158     Subjective Patient reports minor  soreness after the last visit. He feels like the green band is too much at this point. He is otherwise doing well.    Patient is accompained by: Family member    Pertinent History CAD; MI 2017; DMII with peripheral neuropathy    How long can you stand comfortably? depends on the day    How long can you walk comfortably? has walked a 1/4 mile but it took 2 hours    Diagnostic tests lumbar x-ray: recent Facet hypertrophy at L4-5 and L5-S1.  Aortic atherosclerosis    Currently in Pain? No/denies                               [facility] Adult PT Treatment/Exercise - 03/04/21 0001       High Level Balance   High Level Balance Comments normal stance on air-ex 30 sec hold bilateral. narrow base on air-ex with min a 2x30 sec hold; walking over hurdles 2 laps in II bars.      Lumbar Exercises: Aerobic   Nustep 2 min 5 intervals 30 sec rest      Shoulder Exercises: Seated   Other Seated Exercises bicep curls 2x10 2lb; overhead press 2lb 2x10;  shoulder flexion to 90 2x10 lb                      PT Short Term Goals - 02/18/21 1339       PT SHORT TERM GOAL #1   Title Patient will perfrom all 5 stands with sit to stand test    Time 4    Period Weeks    Status On-going    Target Date 03/08/21      PT SHORT TERM GOAL #2   Title Patient will increase bilateral LE strength to 4/5    Time 4    Period Weeks    Status On-going    Target Date 03/08/21      PT SHORT TERM GOAL #3   Title Patients will perfrom narrow base of support with SBA    Time 4    Period Weeks    Status On-going    Target Date 03/08/21               PT Long Term Goals - 02/08/21 1400       PT LONG TERM GOAL #1   Title Patient will ambualte 1000' on 6 min walk test in order to improve ability to ambualte int he community    Time 8    Period Weeks    Status New    Target Date 04/05/21      PT LONG TERM GOAL #2   Title Patient will demonstrate a 30 on the BERG balance scale     Time 6    Period Weeks    Status New    Target Date 03/22/21      PT LONG TERM GOAL #3   Title Patient wil be independent with a gym program to improve strength and endurance    Time 6    Period Weeks    Status New    Target Date 03/22/21                   Plan - 03/04/21 1214     Clinical Impression Statement Therapy added dumbell strengthening to the patients HEP today. He tolerated well. He reports he will get some hand weights. Therapy advanced his intervals on the u-step. He had no complaints. Therapy advanced his balance exercises to an air-ex pad. He had iporved balance on the air-ex pad with practice. We will continue to advance ther-ex as tolerated    Personal Factors and Comorbidities Fitness;Past/Current Experience;Comorbidity 3+;Time since onset of injury/illness/exacerbation    Comorbidities DMII with neuropathy, CAD, left knee pain at times    Examination-Activity Limitations Bed Mobility;Carry;Squat;Sit;Stairs;Lift;Locomotion Level;Other    Examination-Participation Restrictions Cleaning;Community Activity;Driving;Laundry;Shop    Stability/Clinical Decision Making Evolving/Moderate complexity    Clinical Decision Making High    Rehab Potential Good    PT Frequency 2x / week    PT Duration 8 weeks    PT Treatment/Interventions ADLs/Self Care Home Management;Functional mobility training;Therapeutic activities;Therapeutic exercise;Neuromuscular re-education;Gait training;Stair training;Patient/family education;Manual techniques;Passive range of motion    PT Next Visit Plan continue with balance training; continue to progress strengthein; review UE and LE HEP to increase intensity, Nu-step endurance 5 intervals of 1 minute at level 1    PT Home Exercise Plan [facility]             Patient will benefit from skilled therapeutic intervention in order to improve the following deficits and impairments:  Abnormal gait, Difficulty walking, Decreased endurance, Increased  muscle spasms, Pain, Decreased  activity tolerance, Decreased balance, Decreased mobility, Decreased strength  Visit Diagnosis: Other abnormalities of gait and mobility  Difficulty in walking, not elsewhere classified  Muscle weakness (generalized)     Problem List Patient Active Problem List   Diagnosis Date Noted   Adjustment disorder 01/22/2021   Chronic back pain 10/22/2020   Peripheral vertigo 05/15/2020   Dermatitis 01/04/2020   Unintentional weight loss with loose stools 05/19/2019   Low vitamin B12 level 03/29/2019   Heme positive stool 03/14/2019   Normocytic anemia 03/14/2019   Chronic diastolic heart failure ([place]) 02/08/2019   Diabetic retinopathy ([place]) 09/08/2018   Physical debility 05/24/2018   Varicose veins of both lower extremities 03/01/2018   Fatigue due to exertion 10/14/2017   GERD (gastroesophageal reflux disease) 08/25/2017   CAD in native artery 04/14/2017   Hyperlipidemia associated with type 2 diabetes mellitus ([place]) 04/14/2017   Diabetic peripheral neuropathy associated with type 2 diabetes mellitus ([place]) 08/17/2016   T2DM (type 2 diabetes mellitus) ([place]) 05/02/2016   Hypertension associated with diabetes ([place]) 05/02/2016     Carney Living PT DPT  03/04/2021, 2:42 PM  [facility] [address] [place], [place], [address] Phone: [phone]   Fax:  [phone]  Name: Jeffrey [name] MRN: [id number] Date of Birth: 1956/03/20

## 2021-03-06 ENCOUNTER — Other Ambulatory Visit: Payer: Self-pay

## 2021-03-06 ENCOUNTER — Ambulatory Visit (HOSPITAL_BASED_OUTPATIENT_CLINIC_OR_DEPARTMENT_OTHER): Payer: HMO | Admitting: Physical Therapy

## 2021-03-06 ENCOUNTER — Encounter (HOSPITAL_BASED_OUTPATIENT_CLINIC_OR_DEPARTMENT_OTHER): Payer: Self-pay | Admitting: Physical Therapy

## 2021-03-06 DIAGNOSIS — M6281 Muscle weakness (generalized): Secondary | ICD-10-CM

## 2021-03-06 DIAGNOSIS — R262 Difficulty in walking, not elsewhere classified: Secondary | ICD-10-CM

## 2021-03-06 DIAGNOSIS — R2689 Other abnormalities of gait and mobility: Secondary | ICD-10-CM | POA: Diagnosis not present

## 2021-03-06 NOTE — Therapy (Signed)
[facility] [address] [place], [place], [address] Phone: [phone]   Fax:  [phone]  Physical Therapy Treatment  Patient Details  Name: Jeffrey [name] MRN: [id number] Date of Birth: 08/23/1955 Referring Provider (PT): tiffiney   Encounter Date: 03/06/2021   PT End of Session - 03/06/21 1201     Visit Number 8    Number of Visits 16    Date for PT Re-Evaluation 04/05/21    Authorization Type HTA 2022 30 dollars    PT Start Time [id number]    PT Stop Time 1240    PT Time Calculation (min) 45 min    Equipment Utilized During Treatment Gait belt    Activity Tolerance Patient tolerated treatment well    Behavior During Therapy [facility] for tasks assessed/performed             Past Medical History:  Diagnosis Date   Cardiac arrest ([place]) 05/22/2016   Cataract    CHF (congestive heart failure) ([place])    Diabetes mellitus without complication ([place])    type 2   Diabetic retinopathy ([place])    Hyperlipidemia    Hypertension    Memory loss    mild   Myocardial infarct ([place]) 2105   Retinopathy    Both eyes   S/P primary angioplasty with coronary stent 05/22/2016   Vitamin B12 deficiency    Vitreous hemorrhage of left eye ([place])    proliferative diabetic retinopathy    Past Surgical History:  Procedure Laterality Date   ANGIOPLASTY     2015   COLONOSCOPY     multiple   EYE SURGERY     PARS PLANA VITRECTOMY Left 03/15/2020   Procedure: PARS PLANA VITRECTOMY WITH 25 GAUGE;  Surgeon: [name], MD;  Location: [facility];  Service: Ophthalmology;  Laterality: Left;   PHOTOCOAGULATION WITH LASER Left 03/15/2020   Procedure: PHOTOCOAGULATION WITH LASER; INTRAVITREAL INJECTION OF AVASTIN;  Surgeon: [name], MD;  Location: [facility];  Service: Ophthalmology;  Laterality: Left;   REFRACTIVE SURGERY     UPPER GI ENDOSCOPY     several   VITRECTOMY      There were no vitals filed for this visit.   Subjective Assessment -  03/06/21 1157     Subjective Patient reports feelimg similar to last visit. He states he is frustrated that he wakes up feeling fatigue.    Patient is accompained by: Family member    Pertinent History CAD; MI 2017; DMII with peripheral neuropathy    How long can you stand comfortably? depends on the day    How long can you walk comfortably? has walked a 1/4 mile but it took 2 hours    Diagnostic tests lumbar x-ray: recent Facet hypertrophy at L4-5 and L5-S1.  Aortic atherosclerosis    Currently in Pain? No/denies    Multiple Pain Sites No                               [facility] Adult PT Treatment/Exercise - 03/06/21 0001       High Level Balance   High Level Balance Comments normal stance rhomberg 2x30 secs, side to side walk 4 sets across railing back and forth.      Lumbar Exercises: Aerobic   Nustep 2 min 5 intervals 15sec rest      Lumbar Exercises: Standing   Other Standing Lumbar Exercises hip abduction 3x10, hip march 3x10 with support for  both exercises, hip extension 3x10 for each leg      Shoulder Exercises: Seated   Row Strengthening;Both;10 reps;Theraband    Theraband Level (Shoulder Row) Level 3 (Green)    Row Weight (lbs) 2x10    Horizontal ABduction Strengthening;Both;10 reps    Theraband Level (Shoulder Horizontal ABduction) Level 3 (Green)    Horizontal ABduction Limitations 2x10    External Rotation Strengthening;Both;10 reps;Theraband    Theraband Level (Shoulder External Rotation) Level 3 (Green)    External Rotation Limitations 2x10    Other Seated Exercises bicep curls 2x10 2lb; overhead press 2lb 2x10; shoulder flexion to 90 2x10 lb                    PT Education - 03/06/21 1159     Education Details reviewed HEP and balance exercises.    Person(s) Educated Patient    Methods Explanation;Demonstration;Tactile cues;Verbal cues    Comprehension Verbalized understanding;Returned demonstration;Verbal cues required               PT Short Term Goals - 02/18/21 1339       PT SHORT TERM GOAL #1   Title Patient will perfrom all 5 stands with sit to stand test    Time 4    Period Weeks    Status On-going    Target Date 03/08/21      PT SHORT TERM GOAL #2   Title Patient will increase bilateral LE strength to 4/5    Time 4    Period Weeks    Status On-going    Target Date 03/08/21      PT SHORT TERM GOAL #3   Title Patients will perfrom narrow base of support with SBA    Time 4    Period Weeks    Status On-going    Target Date 03/08/21               PT Long Term Goals - 02/08/21 1400       PT LONG TERM GOAL #1   Title Patient will ambualte 1000' on 6 min walk test in order to improve ability to ambualte int he community    Time 8    Period Weeks    Status New    Target Date 04/05/21      PT LONG TERM GOAL #2   Title Patient will demonstrate a 30 on the BERG balance scale    Time 6    Period Weeks    Status New    Target Date 03/22/21      PT LONG TERM GOAL #3   Title Patient wil be independent with a gym program to improve strength and endurance    Time 6    Period Weeks    Status New    Target Date 03/22/21                   Plan - 03/06/21 1337     Clinical Impression Statement Therapy continue with intervals on Nu-step, lower extremity strengthen with weight bearing exercises with gait belt on for safety, and balance focused exercises. Patient tolerated treatment with no increase in pain or discomfort. Patient stated he felt it was a "challenging treatment but not overly difficult." Therapy will continue to advance thera-ex as tolerated.    Personal Factors and Comorbidities Fitness;Past/Current Experience;Comorbidity 3+;Time since onset of injury/illness/exacerbation    Comorbidities DMII with neuropathy, CAD, left knee pain at times    Examination-Activity Limitations Bed Mobility;Carry;Squat;Sit;Stairs;Lift;Locomotion  Level;Other    Examination-Participation  Restrictions Cleaning;Community Activity;Driving;Laundry;Shop    Stability/Clinical Decision Making Evolving/Moderate complexity    Clinical Decision Making High    Rehab Potential Good    PT Frequency 2x / week    PT Duration 8 weeks    PT Treatment/Interventions ADLs/Self Care Home Management;Functional mobility training;Therapeutic activities;Therapeutic exercise;Neuromuscular re-education;Gait training;Stair training;Patient/family education;Manual techniques;Passive range of motion    PT Next Visit Plan continue with balance training; continue to progress strengthein; review UE and LE HEP to increase intensity, Nu-step endurance 5 intervals of 2 minute at level 3 next    PT Home Exercise Plan [facility]             Patient will benefit from skilled therapeutic intervention in order to improve the following deficits and impairments:  Abnormal gait, Difficulty walking, Decreased endurance, Increased muscle spasms, Pain, Decreased activity tolerance, Decreased balance, Decreased mobility, Decreased strength  Visit Diagnosis: Other abnormalities of gait and mobility  Difficulty in walking, not elsewhere classified  Muscle weakness (generalized)     Problem List Patient Active Problem List   Diagnosis Date Noted   Adjustment disorder 01/22/2021   Chronic back pain 10/22/2020   Peripheral vertigo 05/15/2020   Dermatitis 01/04/2020   Unintentional weight loss with loose stools 05/19/2019   Low vitamin B12 level 03/29/2019   Heme positive stool 03/14/2019   Normocytic anemia 03/14/2019   Chronic diastolic heart failure ([place]) 02/08/2019   Diabetic retinopathy ([place]) 09/08/2018   Physical debility 05/24/2018   Varicose veins of both lower extremities 03/01/2018   Fatigue due to exertion 10/14/2017   GERD (gastroesophageal reflux disease) 08/25/2017   CAD in native artery 04/14/2017   Hyperlipidemia associated with type 2 diabetes mellitus ([place]) 04/14/2017   Diabetic peripheral  neuropathy associated with type 2 diabetes mellitus ([place]) 08/17/2016   T2DM (type 2 diabetes mellitus) ([place]) 05/02/2016   Hypertension associated with diabetes ([place]) 05/02/2016    Carney Living PT DPT  03/06/2021, 2:53 PM  Davis Gourd  03/06/2021   During this treatment session, the therapist was present, participating in and directing the treatment.    [facility] [address] [place], [place], [address] Phone: [phone]   Fax:  [phone]  Name: Jeffrey [name] MRN: [id number] Date of Birth: Nov 19, 1955

## 2021-03-11 ENCOUNTER — Encounter (HOSPITAL_BASED_OUTPATIENT_CLINIC_OR_DEPARTMENT_OTHER): Payer: Self-pay | Admitting: Physical Therapy

## 2021-03-11 ENCOUNTER — Other Ambulatory Visit: Payer: Self-pay

## 2021-03-11 ENCOUNTER — Ambulatory Visit (HOSPITAL_BASED_OUTPATIENT_CLINIC_OR_DEPARTMENT_OTHER): Payer: HMO | Attending: Family Medicine | Admitting: Physical Therapy

## 2021-03-11 DIAGNOSIS — R262 Difficulty in walking, not elsewhere classified: Secondary | ICD-10-CM | POA: Insufficient documentation

## 2021-03-11 DIAGNOSIS — M6281 Muscle weakness (generalized): Secondary | ICD-10-CM | POA: Diagnosis not present

## 2021-03-11 DIAGNOSIS — R2689 Other abnormalities of gait and mobility: Secondary | ICD-10-CM | POA: Diagnosis not present

## 2021-03-11 NOTE — Therapy (Signed)
[facility] [address] [place], [place], [address] Phone: [phone]   Fax:  [phone]  Physical Therapy Treatment  Patient Details  Name: Jeffrey [name] MRN: [id number] Date of Birth: [birth year] Referring Provider (PT): tiffiney   Encounter Date: 03/11/2021   PT End of Session - 03/11/21 1155     Visit Number 9    Number of Visits 16    Date for PT Re-Evaluation 04/05/21    Authorization Type HTA 2022 30 dollars    PT Start Time 1147    PT Stop Time 1229    PT Time Calculation (min) 42 min    Equipment Utilized During Treatment Gait belt    Activity Tolerance Patient tolerated treatment well    Behavior During Therapy [facility] for tasks assessed/performed             Past Medical History:  Diagnosis Date   Cardiac arrest ([place]) 05/22/2016   Cataract    CHF (congestive heart failure) ([place])    Diabetes mellitus without complication ([place])    type 2   Diabetic retinopathy ([place])    Hyperlipidemia    Hypertension    Memory loss    mild   Myocardial infarct ([place]) 2105   Retinopathy    Both eyes   S/P primary angioplasty with coronary stent 05/22/2016   Vitamin B12 deficiency    Vitreous hemorrhage of left eye ([place])    proliferative diabetic retinopathy    Past Surgical History:  Procedure Laterality Date   ANGIOPLASTY     2015   COLONOSCOPY     multiple   EYE SURGERY     PARS PLANA VITRECTOMY Left 03/15/2020   Procedure: PARS PLANA VITRECTOMY WITH 25 GAUGE;  Surgeon: [name], MD;  Location: [facility];  Service: Ophthalmology;  Laterality: Left;   PHOTOCOAGULATION WITH LASER Left 03/15/2020   Procedure: PHOTOCOAGULATION WITH LASER; INTRAVITREAL INJECTION OF AVASTIN;  Surgeon: [name], MD;  Location: [facility];  Service: Ophthalmology;  Laterality: Left;   REFRACTIVE SURGERY     UPPER GI ENDOSCOPY     several   VITRECTOMY      There were no vitals filed for this visit.   Subjective Assessment -  03/11/21 1152     Subjective Patient reports feeling well at time of visit. He states that his exercises are not too challenging to complete.    Patient is accompained by: Family member    Pertinent History CAD; MI 2017; DMII with peripheral neuropathy    How long can you stand comfortably? depends on the day    How long can you walk comfortably? has walked a 1/4 mile but it took 2 hours    Diagnostic tests lumbar x-ray: recent Facet hypertrophy at L4-5 and L5-S1.  Aortic atherosclerosis    Currently in Pain? No/denies    Multiple Pain Sites No                               [facility] Adult PT Treatment/Exercise - 03/11/21 0001       Lumbar Exercises: Aerobic   Nustep 2 min 5 intervals 10 sec rest at level 3 with SPM +50    Other Aerobic Exercise Ambulation with cane at increased speed and turns with gait belt/contact gaurd by therapist, 3 sets of 90 feet with 6 turns around clinic.      Lumbar Exercises: Seated   Other Seated Lumbar Exercises seated  abduction 2x10, sitting marching 2x10, LAQ 2x10 all with green band.                    PT Education - 03/11/21 1153     Education Details Reviewed balance exercises, endurance thera-ex, and HEP    Person(s) Educated Patient    Methods Explanation;Demonstration;Tactile cues;Verbal cues    Comprehension Verbalized understanding;Returned demonstration;Verbal cues required              PT Short Term Goals - 02/18/21 1339       PT SHORT TERM GOAL #1   Title Patient will perfrom all 5 stands with sit to stand test    Time 4    Period Weeks    Status On-going    Target Date 03/08/21      PT SHORT TERM GOAL #2   Title Patient will increase bilateral LE strength to 4/5    Time 4    Period Weeks    Status On-going    Target Date 03/08/21      PT SHORT TERM GOAL #3   Title Patients will perfrom narrow base of support with SBA    Time 4    Period Weeks    Status On-going    Target Date 03/08/21                PT Long Term Goals - 02/08/21 1400       PT LONG TERM GOAL #1   Title Patient will ambualte 1000' on 6 min walk test in order to improve ability to ambualte int he community    Time 8    Period Weeks    Status New    Target Date 04/05/21      PT LONG TERM GOAL #2   Title Patient will demonstrate a 30 on the BERG balance scale    Time 6    Period Weeks    Status New    Target Date 03/22/21      PT LONG TERM GOAL #3   Title Patient wil be independent with a gym program to improve strength and endurance    Time 6    Period Weeks    Status New    Target Date 03/22/21                   Plan - 03/11/21 1157     Clinical Impression Statement Therapy continued with intervals on Nu-step on level 3. Patient performed seated hip strengtheing exercises with no pain. Patient completed ambulation at increased speed with walker that included rotations/turns. Patient did report fatigue at end of session and decreased gait speed in order to ambulate safely after treatment session. Therapy should continue to advanced as tolerated while monitering fatigue and heart rate througout session.    Personal Factors and Comorbidities Fitness;Past/Current Experience;Comorbidity 3+;Time since onset of injury/illness/exacerbation    Comorbidities DMII with neuropathy, CAD, left knee pain at times    Examination-Activity Limitations Bed Mobility;Carry;Squat;Sit;Stairs;Lift;Locomotion Level;Other    Examination-Participation Restrictions Cleaning;Community Activity;Driving;Laundry;Shop    Stability/Clinical Decision Making Evolving/Moderate complexity    Clinical Decision Making High    Rehab Potential Good    PT Frequency 2x / week    PT Duration 8 weeks    PT Treatment/Interventions ADLs/Self Care Home Management;Functional mobility training;Therapeutic activities;Therapeutic exercise;Neuromuscular re-education;Gait training;Stair training;Patient/family education;Manual  techniques;Passive range of motion    PT Next Visit Plan continue with balance training; continue to progress strengthein; review UE and LE  HEP to increase intensity, Nu-step endurance 5 intervals of 2 minute at level 3 next    PT Home Exercise Plan [facility]    Consulted and Agree with Plan of Care Patient             Patient will benefit from skilled therapeutic intervention in order to improve the following deficits and impairments:  Abnormal gait, Difficulty walking, Decreased endurance, Increased muscle spasms, Pain, Decreased activity tolerance, Decreased balance, Decreased mobility, Decreased strength  Visit Diagnosis: Other abnormalities of gait and mobility  Difficulty in walking, not elsewhere classified  Muscle weakness (generalized)     Problem List Patient Active Problem List   Diagnosis Date Noted   Adjustment disorder 01/22/2021   Chronic back pain 10/22/2020   Peripheral vertigo 05/15/2020   Dermatitis 01/04/2020   Unintentional weight loss with loose stools 05/19/2019   Low vitamin B12 level 03/29/2019   Heme positive stool 03/14/2019   Normocytic anemia 03/14/2019   Chronic diastolic heart failure ([place]) 02/08/2019   Diabetic retinopathy ([place]) 09/08/2018   Physical debility 05/24/2018   Varicose veins of both lower extremities 03/01/2018   Fatigue due to exertion 10/14/2017   GERD (gastroesophageal reflux disease) 08/25/2017   CAD in native artery 04/14/2017   Hyperlipidemia associated with type 2 diabetes mellitus ([place]) 04/14/2017   Diabetic peripheral neuropathy associated with type 2 diabetes mellitus ([place]) 08/17/2016   T2DM (type 2 diabetes mellitus) ([place]) 05/02/2016   Hypertension associated with diabetes ([place]) 05/02/2016    Carney Living PT DPT 03/11/2021, 3:03 PM  Davis Gourd  03/11/2021   During this treatment session, the therapist was present, participating in and directing the treatment.    [facility] [address] [place], [place], [address] Phone: [phone]   Fax:  [phone]  Name: Jeffrey [name] MRN: [id number] Date of Birth: [birth year]

## 2021-03-13 ENCOUNTER — Other Ambulatory Visit: Payer: Self-pay

## 2021-03-13 ENCOUNTER — Encounter (HOSPITAL_BASED_OUTPATIENT_CLINIC_OR_DEPARTMENT_OTHER): Payer: Self-pay | Admitting: Physical Therapy

## 2021-03-13 ENCOUNTER — Ambulatory Visit (HOSPITAL_BASED_OUTPATIENT_CLINIC_OR_DEPARTMENT_OTHER): Payer: HMO | Admitting: Physical Therapy

## 2021-03-13 DIAGNOSIS — M6281 Muscle weakness (generalized): Secondary | ICD-10-CM

## 2021-03-13 DIAGNOSIS — R2689 Other abnormalities of gait and mobility: Secondary | ICD-10-CM

## 2021-03-13 DIAGNOSIS — R262 Difficulty in walking, not elsewhere classified: Secondary | ICD-10-CM

## 2021-03-14 ENCOUNTER — Encounter (HOSPITAL_BASED_OUTPATIENT_CLINIC_OR_DEPARTMENT_OTHER): Payer: Self-pay | Admitting: Physical Therapy

## 2021-03-14 NOTE — Therapy (Signed)
[facility] [address] [place], [place], [address] Phone: [phone]   Fax:  [phone]  Physical Therapy Treatment/Progress note   Patient Details  Name: Jeffrey [name] MRN: [id number] Date of Birth: 1956/05/23 Referring Provider (PT): Dimas Chyle MD  Progress Note Reporting Period 02/08/2021 to 03/13/2021   See note below for Objective Data and Assessment of Progress/Goals.      Encounter Date: 03/13/2021   PT End of Session - 03/13/21 1147     Visit Number 10    Number of Visits 16    Date for PT Re-Evaluation 04/05/21    Authorization Type HTA 2022 30 dollars    PT Start Time 7322    PT Stop Time 1226    PT Time Calculation (min) 41 min    Equipment Utilized During Treatment Gait belt    Activity Tolerance Patient tolerated treatment well    Behavior During Therapy [facility] for tasks assessed/performed             Past Medical History:  Diagnosis Date   Cardiac arrest ([place]) 05/22/2016   Cataract    CHF (congestive heart failure) ([name])    Diabetes mellitus without complication ([place])    type 2   Diabetic retinopathy ([place])    Hyperlipidemia    Hypertension    Memory loss    mild   Myocardial infarct ([place]) 2105   Retinopathy    Both eyes   S/P primary angioplasty with coronary stent 05/22/2016   Vitamin B12 deficiency    Vitreous hemorrhage of left eye ([place])    proliferative diabetic retinopathy    Past Surgical History:  Procedure Laterality Date   ANGIOPLASTY     2015   COLONOSCOPY     multiple   EYE SURGERY     PARS PLANA VITRECTOMY Left 03/15/2020   Procedure: PARS PLANA VITRECTOMY WITH 25 GAUGE;  Surgeon: [name], MD;  Location: [facility];  Service: Ophthalmology;  Laterality: Left;   PHOTOCOAGULATION WITH LASER Left 03/15/2020   Procedure: PHOTOCOAGULATION WITH LASER; INTRAVITREAL INJECTION OF AVASTIN;  Surgeon: [name], MD;  Location: [facility];  Service: Ophthalmology;  Laterality: Left;    REFRACTIVE SURGERY     UPPER GI ENDOSCOPY     several   VITRECTOMY      There were no vitals filed for this visit.   Subjective Assessment - 03/13/21 1430     Subjective Patient reports feeling well during visit. He states that he was able to walk at home using walker. He was a little tired after the last visit.    Patient is accompained by: Family member    Pertinent History CAD; MI 2017; DMII with peripheral neuropathy    How long can you stand comfortably? depends on the day    How long can you walk comfortably? has walked a 1/4 mile but it took 2 hours    Diagnostic tests lumbar x-ray: recent Facet hypertrophy at L4-5 and L5-S1.  Aortic atherosclerosis    Currently in Pain? No/denies                [facility] PT Assessment - 03/14/21 0001       Assessment   Medical Diagnosis genralized weakness/ Decreased endurance/ general debility    Referring Provider (PT) Dimas Chyle MD      Strength   Overall Strength Comments 4/5 shoulder strength, elbow flexion/ext 4+/5    Right Hand Grip (lbs) 58    Left Hand Grip (lbs)  45    Right Hip Flexion 4-/5    Right Hip ABduction 4-/5    Left Hip Flexion 4-/5    Left Hip ABduction 4-/5    Right Knee Flexion 4/5    Right Knee Extension 4/5    Left Knee Flexion 4/5    Left Knee Extension 4/5      Ambulation/Gait   Gait Comments 6 min walk: 75 HR  99%, 167 feet total      Berg Balance Test   Sit to Stand Able to stand  independently using hands    Standing Unsupported Able to stand 2 minutes with supervision    Sitting with Back Unsupported but Feet Supported on Floor or Stool Able to sit safely and securely 2 minutes    Stand to Sit Controls descent by using hands    Transfers Able to transfer safely, definite need of hands    Standing Unsupported with Eyes Closed Able to stand 10 seconds with supervision    Standing Unsupported with Feet Together Able to place feet together independently and stand for 1 minute with supervision     From Standing, Reach Forward with Outstretched Arm Can reach forward >5 cm safely (2")    From Standing Position, Pick up Object from [place] to pick up shoe, needs supervision    From Standing Position, Turn to Look Behind Over each Shoulder Needs supervision when turning    Turn 360 Degrees Needs close supervision or verbal cueing    Standing Unsupported, Alternately Place Feet on Step/Stool Needs assistance to keep from falling or unable to try    Standing Unsupported, One Foot in Front Loses balance while stepping or standing    Standing on One Leg Unable to try or needs assist to prevent fall    Total Score 29                                   PT Education - 03/13/21 2101     Education Details reviewed results of testing    Person(s) Educated Patient    Methods Explanation;Demonstration;Tactile cues;Verbal cues    Comprehension Verbalized understanding;Returned demonstration;Verbal cues required;Tactile cues required              PT Short Term Goals - 03/14/21 1443       PT SHORT TERM GOAL #1   Title Patient will perfrom all 5 stands with sit to stand test    Baseline not tested because of other tests perfromed    Time 4    Period Weeks    Status On-going    Target Date 04/11/21      PT SHORT TERM GOAL #2   Title Patient will increase bilateral LE strength to 4/5    Baseline everything is 4+/5 except his hip flexor strength    Time 4    Period Weeks    Status Partially Met    Target Date 03/08/21      PT SHORT TERM GOAL #3   Title Patients will perfrom narrow base of support with SBA    Baseline improving balance    Time 4    Period Weeks    Status On-going    Target Date 03/08/21               PT Long Term Goals - 02/08/21 1400       PT LONG TERM GOAL #1  Title Patient will ambualte 1000' on 6 min walk test in order to improve ability to ambualte int he community    Time 8    Period Weeks    Status New    Target Date  04/05/21      PT LONG TERM GOAL #2   Title Patient will demonstrate a 30 on the BERG balance scale    Time 6    Period Weeks    Status New    Target Date 03/22/21      PT LONG TERM GOAL #3   Title Patient wil be independent with a gym program to improve strength and endurance    Time 6    Period Weeks    Status New    Target Date 03/22/21                   Plan - 03/13/21 2104     Clinical Impression Statement Patient is making good progress. His gait distance improved on the 6 minute walk test. His BERG balance test has improved by 10 points. 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 His strength has improved as well. He is no w walking around his aprtment for exercise. Therapy will continue to progress the patient as tolerated.    Personal Factors and Comorbidities Fitness;Past/Current Experience;Comorbidity 3+;Time since onset of injury/illness/exacerbation    Comorbidities DMII with neuropathy, CAD, left knee pain at times    Examination-Activity Limitations Bed Mobility;Carry;Squat;Sit;Stairs;Lift;Locomotion Level;Other    Examination-Participation Restrictions Cleaning;Community Activity;Driving;Laundry;Shop    Stability/Clinical Decision Making Evolving/Moderate complexity    Clinical Decision Making High    Rehab Potential Good    PT Frequency 2x / week    PT Duration 8 weeks    PT Treatment/Interventions ADLs/Self Care Home Management;Functional mobility training;Therapeutic activities;Therapeutic exercise;Neuromuscular re-education;Gait training;Stair training;Patient/family education;Manual techniques;Passive range of motion    PT Next Visit Plan continue with balance training; continue to progress strengthein; review UE and LE HEP to increase intensity, Nu-step endurance 5 intervals of 2 minute at level 3 next    PT Home Exercise Plan [facility]    Consulted and Agree with Plan of Care Patient             Patient will benefit from skilled  therapeutic intervention in order to improve the following deficits and impairments:  Abnormal gait, Difficulty walking, Decreased endurance, Increased muscle spasms, Pain, Decreased activity tolerance, Decreased balance, Decreased mobility, Decreased strength  Visit Diagnosis: Other abnormalities of gait and mobility  Difficulty in walking, not elsewhere classified  Muscle weakness (generalized)     Problem List Patient Active Problem List   Diagnosis Date Noted   Adjustment disorder 01/22/2021   Chronic back pain 10/22/2020   Peripheral vertigo 05/15/2020   Dermatitis 01/04/2020   Unintentional weight loss with loose stools 05/19/2019   Low vitamin B12 level 03/29/2019   Heme positive stool 03/14/2019   Normocytic anemia 03/14/2019   Chronic diastolic heart failure ([place]) 02/08/2019   Diabetic retinopathy ([place]) 09/08/2018   Physical debility 05/24/2018   Varicose veins of both lower extremities 03/01/2018   Fatigue due to exertion 10/14/2017   GERD (gastroesophageal reflux disease) 08/25/2017   CAD in native artery 04/14/2017   Hyperlipidemia associated with type 2 diabetes mellitus ([place]) 04/14/2017   Diabetic peripheral neuropathy associated with type 2 diabetes mellitus ([place]) 08/17/2016   T2DM (type 2 diabetes mellitus) ([place]) 05/02/2016   Hypertension associated with diabetes ([place]) 05/02/2016    Carney Living PT DPT  03/14/2021, 2:48 PM  [facility] [address] [place], [place], [address] Phone: [phone]   Fax:  [phone]  Name: Jeffrey [name] MRN: [id number] Date of Birth: Mar 22, 1956

## 2021-03-18 ENCOUNTER — Ambulatory Visit (HOSPITAL_BASED_OUTPATIENT_CLINIC_OR_DEPARTMENT_OTHER): Payer: HMO | Admitting: Physical Therapy

## 2021-03-20 ENCOUNTER — Ambulatory Visit (HOSPITAL_BASED_OUTPATIENT_CLINIC_OR_DEPARTMENT_OTHER): Payer: HMO | Admitting: Physical Therapy

## 2021-03-20 ENCOUNTER — Encounter (HOSPITAL_BASED_OUTPATIENT_CLINIC_OR_DEPARTMENT_OTHER): Payer: Self-pay | Admitting: Physical Therapy

## 2021-03-20 ENCOUNTER — Other Ambulatory Visit: Payer: Self-pay

## 2021-03-20 DIAGNOSIS — M6281 Muscle weakness (generalized): Secondary | ICD-10-CM

## 2021-03-20 DIAGNOSIS — R2689 Other abnormalities of gait and mobility: Secondary | ICD-10-CM | POA: Diagnosis not present

## 2021-03-20 DIAGNOSIS — R262 Difficulty in walking, not elsewhere classified: Secondary | ICD-10-CM

## 2021-03-20 NOTE — Therapy (Signed)
[facility] [address] [place], [place], [address] Phone: [phone]   Fax:  [phone]  Physical Therapy Treatment  Patient Details  Name: Jeffrey [name] MRN: [id number] Date of Birth: [birth year] Referring Provider (PT): Dimas Chyle MD   Encounter Date: 03/20/2021   PT End of Session - 03/20/21 1256     Visit Number 11    Number of Visits 16    Date for PT Re-Evaluation 04/05/21    Authorization Type HTA 2022 30 dollars    PT Start Time 1148    PT Stop Time 9528    PT Time Calculation (min) 42 min    Activity Tolerance Patient tolerated treatment well    Behavior During Therapy [facility] for tasks assessed/performed             Past Medical History:  Diagnosis Date   Cardiac arrest ([place]) 05/22/2016   Cataract    CHF (congestive heart failure) ([place])    Diabetes mellitus without complication ([place])    type 2   Diabetic retinopathy ([place])    Hyperlipidemia    Hypertension    Memory loss    mild   Myocardial infarct ([place]) 2105   Retinopathy    Both eyes   S/P primary angioplasty with coronary stent 05/22/2016   Vitamin B12 deficiency    Vitreous hemorrhage of left eye ([place])    proliferative diabetic retinopathy    Past Surgical History:  Procedure Laterality Date   ANGIOPLASTY     2015   COLONOSCOPY     multiple   EYE SURGERY     PARS PLANA VITRECTOMY Left 03/15/2020   Procedure: PARS PLANA VITRECTOMY WITH 25 GAUGE;  Surgeon: [name], MD;  Location: [facility];  Service: Ophthalmology;  Laterality: Left;   PHOTOCOAGULATION WITH LASER Left 03/15/2020   Procedure: PHOTOCOAGULATION WITH LASER; INTRAVITREAL INJECTION OF AVASTIN;  Surgeon: [name], MD;  Location: [facility];  Service: Ophthalmology;  Laterality: Left;   REFRACTIVE SURGERY     UPPER GI ENDOSCOPY     several   VITRECTOMY      There were no vitals filed for this visit.   Subjective Assessment - 03/20/21 1252     Subjective Patient reports  feeling well during vist but states he wants to continue to improve endurance and strength.    Patient is accompained by: Family member    Pertinent History CAD; MI 2017; DMII with peripheral neuropathy    How long can you stand comfortably? depends on the day    How long can you walk comfortably? has walked a 1/4 mile but it took 2 hours    Diagnostic tests lumbar x-ray: recent Facet hypertrophy at L4-5 and L5-S1.  Aortic atherosclerosis    Currently in Pain? No/denies    Multiple Pain Sites No                               [facility] Adult PT Treatment/Exercise - 03/20/21 0001       Lumbar Exercises: Aerobic   Nustep 2 min 4  intervals 10 sec rest at level 3 with SPM +60      Knee/Hip Exercises: Seated   Long Arc Quad Strengthening;Both;2 sets;10 reps    Clamshell with TheraBand Green   3x10 clamshells   Marching Strengthening;Both;3 sets;10 reps    Marching Limitations green band      Shoulder Exercises: Seated   Extension Strengthening;Both;20 reps;Theraband  Theraband Level (Shoulder Extension) Level 2 (Red)    Row Strengthening;Both;Theraband;20 reps    Theraband Level (Shoulder Row) Level 2 (Red)    External Rotation Strengthening;Both;10 reps;Theraband    Theraband Level (Shoulder External Rotation) Level 2 (Red)                    PT Education - 03/20/21 1253     Education Details Reviewed HEP and what the future goals for PT will be.    Person(s) Educated Patient    Methods Explanation;Demonstration;Verbal cues    Comprehension Verbalized understanding;Returned demonstration;Verbal cues required              PT Short Term Goals - 03/14/21 1443       PT SHORT TERM GOAL #1   Title Patient will perfrom all 5 stands with sit to stand test    Baseline not tested because of other tests perfromed    Time 4    Period Weeks    Status On-going    Target Date 04/11/21      PT SHORT TERM GOAL #2   Title Patient will increase bilateral  LE strength to 4/5    Baseline everything is 4+/5 except his hip flexor strength    Time 4    Period Weeks    Status Partially Met    Target Date 03/08/21      PT SHORT TERM GOAL #3   Title Patients will perfrom narrow base of support with SBA    Baseline improving balance    Time 4    Period Weeks    Status On-going    Target Date 03/08/21               PT Long Term Goals - 02/08/21 1400       PT LONG TERM GOAL #1   Title Patient will ambualte 1000' on 6 min walk test in order to improve ability to ambualte int he community    Time 8    Period Weeks    Status New    Target Date 04/05/21      PT LONG TERM GOAL #2   Title Patient will demonstrate a 30 on the BERG balance scale    Time 6    Period Weeks    Status New    Target Date 03/22/21      PT LONG TERM GOAL #3   Title Patient wil be independent with a gym program to improve strength and endurance    Time 6    Period Weeks    Status New    Target Date 03/22/21                   Plan - 03/20/21 1707     Clinical Impression Statement Patient tolerated treatment with no increase in pain or discomfort. Patient did report some fatigue at end of session but stated he felt good to be challenged. Therapy consisted of strengtheing exercises for both upper and lower extremity. Patient completed 4 sets of Nu-step at increase speed with no breaks needed. Patient continues to improve HEP and thera-ex. Therapy should continue to improve strenght, balance, and endurance as tolerated.    Personal Factors and Comorbidities Fitness;Past/Current Experience;Comorbidity 3+;Time since onset of injury/illness/exacerbation    Comorbidities DMII with neuropathy, CAD, left knee pain at times    Examination-Activity Limitations Bed Mobility;Carry;Squat;Sit;Stairs;Lift;Locomotion Level;Other    Examination-Participation Restrictions Cleaning;Community Activity;Driving;Laundry;Shop    Stability/Clinical Decision Making  Evolving/Moderate complexity  Clinical Decision Making High    Rehab Potential Good    PT Frequency 2x / week    PT Duration 8 weeks    PT Treatment/Interventions ADLs/Self Care Home Management;Functional mobility training;Therapeutic activities;Therapeutic exercise;Neuromuscular re-education;Gait training;Stair training;Patient/family education;Manual techniques;Passive range of motion    PT Next Visit Plan continue with balance training; continue to progress strengthein; review UE and LE HEP to increase intensity, Nu-step endurance 5 intervals of 2 minute at level 3 next    PT Home Exercise Plan [facility]    Consulted and Agree with Plan of Care Patient             Patient will benefit from skilled therapeutic intervention in order to improve the following deficits and impairments:  Abnormal gait, Difficulty walking, Decreased endurance, Increased muscle spasms, Pain, Decreased activity tolerance, Decreased balance, Decreased mobility, Decreased strength  Visit Diagnosis: Other abnormalities of gait and mobility  Difficulty in walking, not elsewhere classified  Muscle weakness (generalized)     Problem List Patient Active Problem List   Diagnosis Date Noted   Adjustment disorder 01/22/2021   Chronic back pain 10/22/2020   Peripheral vertigo 05/15/2020   Dermatitis 01/04/2020   Unintentional weight loss with loose stools 05/19/2019   Low vitamin B12 level 03/29/2019   Heme positive stool 03/14/2019   Normocytic anemia 03/14/2019   Chronic diastolic heart failure ([place]) 02/08/2019   Diabetic retinopathy ([place]) 09/08/2018   Physical debility 05/24/2018   Varicose veins of both lower extremities 03/01/2018   Fatigue due to exertion 10/14/2017   GERD (gastroesophageal reflux disease) 08/25/2017   CAD in native artery 04/14/2017   Hyperlipidemia associated with type 2 diabetes mellitus ([place]) 04/14/2017   Diabetic peripheral neuropathy associated with type 2 diabetes mellitus  ([place]) 08/17/2016   T2DM (type 2 diabetes mellitus) ([place]) 05/02/2016   Hypertension associated with diabetes ([place]) 05/02/2016    Billey Co SPT 03/20/2021, 5:14 PM  [facility] [address] [place], [place], [address] Phone: [phone]   Fax:  [phone]  Name: Jeffrey [name] MRN: [id number] Date of Birth: [birth year]

## 2021-03-21 ENCOUNTER — Telehealth: Payer: Self-pay

## 2021-03-21 NOTE — Telephone Encounter (Signed)
Patient is calling in stating he would like to get his A1C checked, and doesn't want to get off track because he normally has it checked in August not September.

## 2021-03-21 NOTE — Telephone Encounter (Signed)
Spoke with patient  Aware last A1C done on 01/22/2021, next one is done after 3 month  A1C will be done on his next appointment on 04/23/2021

## 2021-03-27 ENCOUNTER — Telehealth: Payer: Self-pay

## 2021-03-27 ENCOUNTER — Other Ambulatory Visit: Payer: Self-pay | Admitting: Family Medicine

## 2021-03-27 NOTE — Telephone Encounter (Signed)
Error

## 2021-03-27 NOTE — Telephone Encounter (Signed)
See below

## 2021-03-27 NOTE — Telephone Encounter (Signed)
Recommend he decrease to '1000mg'$  daily and follow up in a few days.  [name]. [name], MD 03/27/2021 11:54 AM

## 2021-03-27 NOTE — Telephone Encounter (Signed)
Patient is calling in stating that he isnt tolerating the Metformin having diarrhea constantly. Wanting to know if he can be worked in or if there is something they can do, offered virtual but declined.

## 2021-03-27 NOTE — Telephone Encounter (Signed)
Informed patient to decreases metformin to '1000mg'$  will follow up with PCP in a few days

## 2021-03-28 ENCOUNTER — Other Ambulatory Visit: Payer: Self-pay | Admitting: Family Medicine

## 2021-03-29 ENCOUNTER — Encounter: Payer: Self-pay | Admitting: Family Medicine

## 2021-03-29 ENCOUNTER — Other Ambulatory Visit: Payer: Self-pay

## 2021-03-29 MED ORDER — SITAGLIPTIN PHOSPHATE 100 MG PO TABS: 100.0000 mg | ORAL_TABLET | Freq: Every day | ORAL | 0 refills | Status: DC

## 2021-03-29 NOTE — Telephone Encounter (Signed)
Januvia sent to pharmacy  

## 2021-03-29 NOTE — Telephone Encounter (Signed)
Lvm to make pt aware.  

## 2021-03-29 NOTE — Telephone Encounter (Signed)
See note

## 2021-04-01 ENCOUNTER — Other Ambulatory Visit: Payer: Self-pay

## 2021-04-01 ENCOUNTER — Ambulatory Visit (HOSPITAL_BASED_OUTPATIENT_CLINIC_OR_DEPARTMENT_OTHER): Payer: HMO | Admitting: Physical Therapy

## 2021-04-01 ENCOUNTER — Encounter (HOSPITAL_BASED_OUTPATIENT_CLINIC_OR_DEPARTMENT_OTHER): Payer: Self-pay | Admitting: Physical Therapy

## 2021-04-01 DIAGNOSIS — R262 Difficulty in walking, not elsewhere classified: Secondary | ICD-10-CM

## 2021-04-01 DIAGNOSIS — R2689 Other abnormalities of gait and mobility: Secondary | ICD-10-CM

## 2021-04-01 DIAGNOSIS — M6281 Muscle weakness (generalized): Secondary | ICD-10-CM

## 2021-04-02 ENCOUNTER — Telehealth: Payer: Self-pay | Admitting: *Deleted

## 2021-04-02 ENCOUNTER — Other Ambulatory Visit: Payer: Self-pay | Admitting: *Deleted

## 2021-04-02 ENCOUNTER — Encounter (HOSPITAL_BASED_OUTPATIENT_CLINIC_OR_DEPARTMENT_OTHER): Payer: Self-pay | Admitting: Physical Therapy

## 2021-04-02 NOTE — Therapy (Signed)
[facility] [address] [place], [place], [address] Phone: [phone]   Fax:  [phone]  Physical Therapy Treatment  Patient Details  Name: Jeffrey [name] MRN: [id number] Date of Birth: 12-28-1955 Referring Provider (PT): Dimas Chyle MD   Encounter Date: 04/01/2021   PT End of Session - 04/01/21 2683     Visit Number 12    Number of Visits 16    Date for PT Re-Evaluation 04/05/21    Authorization Type HTA 2022 30 dollars    PT Start Time 4196    PT Stop Time 2229    PT Time Calculation (min) 45 min    Activity Tolerance Patient tolerated treatment well    Behavior During Therapy [facility] for tasks assessed/performed             Past Medical History:  Diagnosis Date   Cardiac arrest ([place]) 05/22/2016   Cataract    CHF (congestive heart failure) ([place])    Diabetes mellitus without complication ([place])    type 2   Diabetic retinopathy ([place])    Hyperlipidemia    Hypertension    Memory loss    mild   Myocardial infarct ([place]) 2105   Retinopathy    Both eyes   S/P primary angioplasty with coronary stent 05/22/2016   Vitamin B12 deficiency    Vitreous hemorrhage of left eye ([place])    proliferative diabetic retinopathy    Past Surgical History:  Procedure Laterality Date   ANGIOPLASTY     2015   COLONOSCOPY     multiple   EYE SURGERY     PARS PLANA VITRECTOMY Left 03/15/2020   Procedure: PARS PLANA VITRECTOMY WITH 25 GAUGE;  Surgeon: [name], MD;  Location: [facility];  Service: Ophthalmology;  Laterality: Left;   PHOTOCOAGULATION WITH LASER Left 03/15/2020   Procedure: PHOTOCOAGULATION WITH LASER; INTRAVITREAL INJECTION OF AVASTIN;  Surgeon: [name], MD;  Location: [facility];  Service: Ophthalmology;  Laterality: Left;   REFRACTIVE SURGERY     UPPER GI ENDOSCOPY     several   VITRECTOMY      There were no vitals filed for this visit.   Subjective Assessment - 04/01/21 1211     Subjective Patient's  elevator has been broken so he has had to do more walking. He reports he has not been doing his exercises as much becuase of that. He has started using the walker. He had 2 falls last week with the cane. He feels more stable with the walker.    Patient is accompained by: Family member    Pertinent History CAD; MI 2017; DMII with peripheral neuropathy    How long can you stand comfortably? depends on the day    How long can you walk comfortably? has walked a 1/4 mile but it took 2 hours    Diagnostic tests lumbar x-ray: recent Facet hypertrophy at L4-5 and L5-S1.  Aortic atherosclerosis    Currently in Pain? No/denies                               [facility] Adult PT Treatment/Exercise - 04/02/21 0001       High Level Balance   High Level Balance Comments narrow base 3x30 sec holdnarrow base eyes closed 3x30 sec hold narrow base air-ex eyes open and closed 3x20 sec hold; tandem stance 2x20 sec each leg.      Lumbar Exercises: Aerobic   Nustep 2  min 5  intervals 10 sec rest at level 3 with SPM +60 HR remained between 72 and 78.      Shoulder Exercises: Seated   Other Seated Exercises bilateral shoulder flexion 2x10 3lb; biceps curl 2x10 3lbs; shoulder press 3x10 3lbs                    PT Education - 04/01/21 1213     Education Details benefits of balance exercises. Improtance of consitent HEP as home.    Person(s) Educated Patient    Methods Explanation;Demonstration;Tactile cues;Verbal cues    Comprehension Verbalized understanding;Returned demonstration;Verbal cues required;Tactile cues required              PT Short Term Goals - 03/14/21 1443       PT SHORT TERM GOAL #1   Title Patient will perfrom all 5 stands with sit to stand test    Baseline not tested because of other tests perfromed    Time 4    Period Weeks    Status On-going    Target Date 04/11/21      PT SHORT TERM GOAL #2   Title Patient will increase bilateral LE strength to 4/5     Baseline everything is 4+/5 except his hip flexor strength    Time 4    Period Weeks    Status Partially Met    Target Date 03/08/21      PT SHORT TERM GOAL #3   Title Patients will perfrom narrow base of support with SBA    Baseline improving balance    Time 4    Period Weeks    Status On-going    Target Date 03/08/21               PT Long Term Goals - 02/08/21 1400       PT LONG TERM GOAL #1   Title Patient will ambualte 1000' on 6 min walk test in order to improve ability to ambualte int he community    Time 8    Period Weeks    Status New    Target Date 04/05/21      PT LONG TERM GOAL #2   Title Patient will demonstrate a 30 on the BERG balance scale    Time 6    Period Weeks    Status New    Target Date 03/22/21      PT LONG TERM GOAL #3   Title Patient wil be independent with a gym program to improve strength and endurance    Time 6    Period Weeks    Status New    Target Date 03/22/21                   Plan - 04/01/21 1226     Clinical Impression Statement Therapy continues to advance patient as tolerated. He was able to complete intervals without much difficulty. His HR remained consitent. he reported mild fatigue. We will continue to work to elevate his HR within a target zone to build cardiovasular endurance. He did well with balance exercises. He perfromed exercises on the air-ex. He was encouraged to continue with the walker. He can walk further, with increased speed and safety with the walker.    Personal Factors and Comorbidities Fitness;Past/Current Experience;Comorbidity 3+;Time since onset of injury/illness/exacerbation    Comorbidities DMII with neuropathy, CAD, left knee pain at times    Examination-Activity Limitations Bed Mobility;Carry;Squat;Sit;Stairs;Lift;Locomotion Level;Other    Examination-Participation Restrictions Cleaning;Community  Activity;Driving;Laundry;Shop    Stability/Clinical Decision Making Evolving/Moderate  complexity    Clinical Decision Making High    Rehab Potential Good    PT Frequency 2x / week    PT Duration 8 weeks    PT Treatment/Interventions ADLs/Self Care Home Management;Functional mobility training;Therapeutic activities;Therapeutic exercise;Neuromuscular re-education;Gait training;Stair training;Patient/family education;Manual techniques;Passive range of motion    PT Next Visit Plan continue with balance training; continue to progress strengthein; review UE and LE HEP to increase intensity, Nu-step endurance 5 intervals of 2 minute at level 3 next    PT Home Exercise Plan [facility]    Consulted and Agree with Plan of Care Patient             Patient will benefit from skilled therapeutic intervention in order to improve the following deficits and impairments:  Abnormal gait, Difficulty walking, Decreased endurance, Increased muscle spasms, Pain, Decreased activity tolerance, Decreased balance, Decreased mobility, Decreased strength  Visit Diagnosis: Other abnormalities of gait and mobility  Difficulty in walking, not elsewhere classified  Muscle weakness (generalized)     Problem List Patient Active Problem List   Diagnosis Date Noted   Adjustment disorder 01/22/2021   Chronic back pain 10/22/2020   Peripheral vertigo 05/15/2020   Dermatitis 01/04/2020   Unintentional weight loss with loose stools 05/19/2019   Low vitamin B12 level 03/29/2019   Heme positive stool 03/14/2019   Normocytic anemia 03/14/2019   Chronic diastolic heart failure ([place]) 02/08/2019   Diabetic retinopathy ([place]) 09/08/2018   Physical debility 05/24/2018   Varicose veins of both lower extremities 03/01/2018   Fatigue due to exertion 10/14/2017   GERD (gastroesophageal reflux disease) 08/25/2017   CAD in native artery 04/14/2017   Hyperlipidemia associated with type 2 diabetes mellitus ([place]) 04/14/2017   Diabetic peripheral neuropathy associated with type 2 diabetes mellitus ([place]) 08/17/2016    T2DM (type 2 diabetes mellitus) ([place]) 05/02/2016   Hypertension associated with diabetes ([place]) 05/02/2016    Carney Living PT DPT  04/02/2021, 8:28 AM  [facility] [address] [place], [place], [address] Phone: [phone]   Fax:  [phone]  Name: Jeffrey [name] MRN: [id number] Date of Birth: [birth year]

## 2021-04-02 NOTE — Telephone Encounter (Signed)
Spoke with patient, Patient will continue taking Rx  Metformin

## 2021-04-02 NOTE — Telephone Encounter (Signed)
Noted. We can discuss more when he comes in for his physical.  Jeffrey [name]. [name], MD 04/02/2021 11:10 AM

## 2021-04-02 NOTE — Telephone Encounter (Signed)
Patient call stated continue taking metformin since is controlling his glucose with it.  Rx Januvia was send to pharmacy patient state cost is high and does not want to change from metformin. Requested to deleted Rx Januvia from his medication list, Rx list updated

## 2021-04-03 ENCOUNTER — Other Ambulatory Visit: Payer: Self-pay

## 2021-04-03 ENCOUNTER — Encounter (HOSPITAL_BASED_OUTPATIENT_CLINIC_OR_DEPARTMENT_OTHER): Payer: Self-pay | Admitting: Physical Therapy

## 2021-04-03 ENCOUNTER — Ambulatory Visit (HOSPITAL_BASED_OUTPATIENT_CLINIC_OR_DEPARTMENT_OTHER): Payer: HMO | Admitting: Physical Therapy

## 2021-04-03 DIAGNOSIS — M6281 Muscle weakness (generalized): Secondary | ICD-10-CM

## 2021-04-03 DIAGNOSIS — R2689 Other abnormalities of gait and mobility: Secondary | ICD-10-CM | POA: Diagnosis not present

## 2021-04-03 DIAGNOSIS — R262 Difficulty in walking, not elsewhere classified: Secondary | ICD-10-CM

## 2021-04-04 ENCOUNTER — Encounter (HOSPITAL_BASED_OUTPATIENT_CLINIC_OR_DEPARTMENT_OTHER): Payer: Self-pay | Admitting: Physical Therapy

## 2021-04-04 NOTE — Therapy (Signed)
[facility] [address] [place], [place], [address] Phone: [phone]   Fax:  [phone]  Physical Therapy Treatment  Patient Details  Name: Jeffrey [name] MRN: [id number] Date of Birth: [birth year] Referring Provider (PT): Dimas Chyle MD   Encounter Date: 04/03/2021   PT End of Session - 04/04/21 0831     Visit Number 13    Number of Visits 16    Date for PT Re-Evaluation 04/05/21    Authorization Type HTA 2022 30 dollars    PT Start Time 1660    PT Stop Time 1228    PT Time Calculation (min) 43 min    Activity Tolerance Patient tolerated treatment well    Behavior During Therapy [facility] for tasks assessed/performed             Past Medical History:  Diagnosis Date   Cardiac arrest ([place]) 05/22/2016   Cataract    CHF (congestive heart failure) ([place])    Diabetes mellitus without complication ([place])    type 2   Diabetic retinopathy ([place])    Hyperlipidemia    Hypertension    Memory loss    mild   Myocardial infarct ([place]) 2105   Retinopathy    Both eyes   S/P primary angioplasty with coronary stent 05/22/2016   Vitamin B12 deficiency    Vitreous hemorrhage of left eye ([place])    proliferative diabetic retinopathy    Past Surgical History:  Procedure Laterality Date   ANGIOPLASTY     2015   COLONOSCOPY     multiple   EYE SURGERY     PARS PLANA VITRECTOMY Left 03/15/2020   Procedure: PARS PLANA VITRECTOMY WITH 25 GAUGE;  Surgeon: [name], MD;  Location: [facility];  Service: Ophthalmology;  Laterality: Left;   PHOTOCOAGULATION WITH LASER Left 03/15/2020   Procedure: PHOTOCOAGULATION WITH LASER; INTRAVITREAL INJECTION OF AVASTIN;  Surgeon: [name], MD;  Location: [facility];  Service: Ophthalmology;  Laterality: Left;   REFRACTIVE SURGERY     UPPER GI ENDOSCOPY     several   VITRECTOMY      There were no vitals filed for this visit.   Subjective Assessment - 04/03/21 1149     Subjective Patient  tolerated treatment well last time. He reported nos significant fatigue after treatment. He reports he feels better when he is doing his treatments.    Patient is accompained by: Family member    Pertinent History CAD; MI 2017; DMII with peripheral neuropathy    How long can you stand comfortably? depends on the day    How long can you walk comfortably? has walked a 1/4 mile but it took 2 hours    Diagnostic tests lumbar x-ray: recent Facet hypertrophy at L4-5 and L5-S1.  Aortic atherosclerosis    Currently in Pain? No/denies                               [facility] Adult PT Treatment/Exercise - 04/04/21 0001       Lumbar Exercises: Aerobic   Nustep 2 min 5  intervals 10 sec rest at level 3 witremained between 72 and 80.L4 today      Lumbar Exercises: Standing   Heel Raises Limitations x20    Other Standing Lumbar Exercises hip abduction 3x10, hip march 3x10 with support for both exercises cuing for hip flexion, hip extension 3x10 for each leg    Other Standing Lumbar Exercises Step  ups 2 inch 2x10; lateral step ups 2x10 each side;                    PT Education - 04/03/21 1151     Education Details reviewed benefits  cardiovascualr training to increase endurance    Person(s) Educated Patient    Methods Demonstration;Tactile cues;Verbal cues;Explanation    Comprehension Verbalized understanding;Returned demonstration;Verbal cues required;Tactile cues required              PT Short Term Goals - 03/14/21 1443       PT SHORT TERM GOAL #1   Title Patient will perfrom all 5 stands with sit to stand test    Baseline not tested because of other tests perfromed    Time 4    Period Weeks    Status On-going    Target Date 04/11/21      PT SHORT TERM GOAL #2   Title Patient will increase bilateral LE strength to 4/5    Baseline everything is 4+/5 except his hip flexor strength    Time 4    Period Weeks    Status Partially Met    Target Date 03/08/21       PT SHORT TERM GOAL #3   Title Patients will perfrom narrow base of support with SBA    Baseline improving balance    Time 4    Period Weeks    Status On-going    Target Date 03/08/21               PT Long Term Goals - 02/08/21 1400       PT LONG TERM GOAL #1   Title Patient will ambualte 1000' on 6 min walk test in order to improve ability to ambualte int he community    Time 8    Period Weeks    Status New    Target Date 04/05/21      PT LONG TERM GOAL #2   Title Patient will demonstrate a 30 on the BERG balance scale    Time 6    Period Weeks    Status New    Target Date 03/22/21      PT LONG TERM GOAL #3   Title Patient wil be independent with a gym program to improve strength and endurance    Time 6    Period Weeks    Status New    Target Date 03/22/21                   Plan - 04/03/21 1224     Clinical Impression Statement Patient continues to make good progress. he felt more fatigued after the level 4 nu-step. his HR reached 80 bpm but did not elevate much from baseline. The patient is shuffling with his walker. Therapy focused on exercises to get him to increase his hip flexion today. We reviewed his standing exercises for his HEP.    Personal Factors and Comorbidities Fitness;Past/Current Experience;Comorbidity 3+;Time since onset of injury/illness/exacerbation    Comorbidities DMII with neuropathy, CAD, left knee pain at times    Examination-Activity Limitations Bed Mobility;Carry;Squat;Sit;Stairs;Lift;Locomotion Level;Other    Examination-Participation Restrictions Cleaning;Community Activity;Driving;Laundry;Shop    Stability/Clinical Decision Making Evolving/Moderate complexity    Clinical Decision Making High    Rehab Potential Good    PT Frequency 2x / week    PT Duration 8 weeks    PT Treatment/Interventions ADLs/Self Care Home Management;Functional mobility training;Therapeutic activities;Therapeutic exercise;Neuromuscular  re-education;Gait training;Stair training;Patient/family education;Manual  techniques;Passive range of motion    PT Next Visit Plan continue with balance training; continue to progress strengthein; review UE and LE HEP to increase intensity, Nu-step endurance 5 intervals of 2 minute at level 3 next    PT Home Exercise Plan [facility]    Consulted and Agree with Plan of Care Patient             Patient will benefit from skilled therapeutic intervention in order to improve the following deficits and impairments:  Abnormal gait, Difficulty walking, Decreased endurance, Increased muscle spasms, Pain, Decreased activity tolerance, Decreased balance, Decreased mobility, Decreased strength  Visit Diagnosis: Other abnormalities of gait and mobility  Difficulty in walking, not elsewhere classified  Muscle weakness (generalized)     Problem List Patient Active Problem List   Diagnosis Date Noted   Adjustment disorder 01/22/2021   Chronic back pain 10/22/2020   Peripheral vertigo 05/15/2020   Dermatitis 01/04/2020   Unintentional weight loss with loose stools 05/19/2019   Low vitamin B12 level 03/29/2019   Heme positive stool 03/14/2019   Normocytic anemia 03/14/2019   Chronic diastolic heart failure ([place]) 02/08/2019   Diabetic retinopathy ([place]) 09/08/2018   Physical debility 05/24/2018   Varicose veins of both lower extremities 03/01/2018   Fatigue due to exertion 10/14/2017   GERD (gastroesophageal reflux disease) 08/25/2017   CAD in native artery 04/14/2017   Hyperlipidemia associated with type 2 diabetes mellitus ([place]) 04/14/2017   Diabetic peripheral neuropathy associated with type 2 diabetes mellitus ([place]) 08/17/2016   T2DM (type 2 diabetes mellitus) ([place]) 05/02/2016   Hypertension associated with diabetes ([place]) 05/02/2016    Carney Living PT DPT  04/04/2021, 9:52 AM  [facility] [address] [place], [place],  [address] Phone: [phone]   Fax:  [phone]  Name: Parv Manthey MRN: [id number] Date of Birth: August 12, 1955

## 2021-04-08 ENCOUNTER — Encounter (HOSPITAL_BASED_OUTPATIENT_CLINIC_OR_DEPARTMENT_OTHER): Payer: Self-pay | Admitting: Physical Therapy

## 2021-04-08 ENCOUNTER — Other Ambulatory Visit: Payer: Self-pay

## 2021-04-08 ENCOUNTER — Ambulatory Visit (HOSPITAL_BASED_OUTPATIENT_CLINIC_OR_DEPARTMENT_OTHER): Payer: HMO | Admitting: Physical Therapy

## 2021-04-08 DIAGNOSIS — M6281 Muscle weakness (generalized): Secondary | ICD-10-CM

## 2021-04-08 DIAGNOSIS — R262 Difficulty in walking, not elsewhere classified: Secondary | ICD-10-CM

## 2021-04-08 DIAGNOSIS — R2689 Other abnormalities of gait and mobility: Secondary | ICD-10-CM | POA: Diagnosis not present

## 2021-04-08 NOTE — Therapy (Signed)
[facility] [address] [place], [place], [address] Phone: [phone]   Fax:  [phone]  Physical Therapy Treatment  Patient Details  Name: Jeffrey [name] MRN: [id number] Date of Birth: 1956-08-09 Referring Provider (PT): Dimas Chyle MD   Encounter Date: 04/08/2021   PT End of Session - 04/08/21 1206     Visit Number 14    Number of Visits 30    Date for PT Re-Evaluation 06/03/21    Authorization Type HTA 2022 30 dollars    PT Start Time 1147    PT Stop Time 1233    PT Time Calculation (min) 46 min    Activity Tolerance Patient tolerated treatment well    Behavior During Therapy [facility] for tasks assessed/performed             Past Medical History:  Diagnosis Date   Cardiac arrest ([place]) 05/22/2016   Cataract    CHF (congestive heart failure) ([facility])    Diabetes mellitus without complication ([place])    type 2   Diabetic retinopathy ([place])    Hyperlipidemia    Hypertension    Memory loss    mild   Myocardial infarct ([place]) 2105   Retinopathy    Both eyes   S/P primary angioplasty with coronary stent 05/22/2016   Vitamin B12 deficiency    Vitreous hemorrhage of left eye ([place])    proliferative diabetic retinopathy    Past Surgical History:  Procedure Laterality Date   ANGIOPLASTY     2015   COLONOSCOPY     multiple   EYE SURGERY     PARS PLANA VITRECTOMY Left 03/15/2020   Procedure: PARS PLANA VITRECTOMY WITH 25 GAUGE;  Surgeon: [name], MD;  Location: [facility];  Service: Ophthalmology;  Laterality: Left;   PHOTOCOAGULATION WITH LASER Left 03/15/2020   Procedure: PHOTOCOAGULATION WITH LASER; INTRAVITREAL INJECTION OF AVASTIN;  Surgeon: [name], MD;  Location: [facility];  Service: Ophthalmology;  Laterality: Left;   REFRACTIVE SURGERY     UPPER GI ENDOSCOPY     several   VITRECTOMY      There were no vitals filed for this visit.   Subjective Assessment - 04/08/21 1157     Subjective Patient reports  he had brusing across his toes after the last visit. he reports he can not feel his feet. He otherwsie felt good.    Patient is accompained by: Family member    Pertinent History CAD; MI 2017; DMII with peripheral neuropathy    How long can you stand comfortably? depends on the day    How long can you walk comfortably? has walked a 1/4 mile but it took 2 hours    Diagnostic tests lumbar x-ray: recent Facet hypertrophy at L4-5 and L5-S1.  Aortic atherosclerosis    Currently in Pain? No/denies    Multiple Pain Sites No                [facility] PT Assessment - 04/08/21 0001       Assessment   Medical Diagnosis genralized weakness/ Decreased endurance/ general debility    Referring Provider (PT) Dimas Chyle MD      Strength   Right Hand Grip (lbs) 60    Left Hand Grip (lbs) 45    Right Hip Flexion 4/5    Right Hip ABduction 4/5    Left Hip Flexion 4/5    Left Hip ABduction 4+/5    Right Knee Flexion 4+/5    Right Knee Extension  4+/5    Left Knee Flexion 4+/5    Left Knee Extension 4+/5      Transfers   Five time sit to stand comments  5x in 18 seconds using the walker      Ambulation/Gait   Gait Comments 6 min walk: 75 HR  99%, 167 feet total ( from 8/4) walking with walker now. Patient is more upright and has a more normal gait pattern.                           [facility] Adult PT Treatment/Exercise - 04/08/21 0001       High Level Balance   High Level Balance Comments narrow base 3x30 sec holdnarrow base eyes closed 3x30 sec hold narrow base air-ex eyes open and closed 3x20 sec hold; tandem stance 2x20 sec each leg.      Lumbar Exercises: Aerobic   Nustep 2 min 5  intervals 10 sec rest at level 3 witremained between 72 and 80.L4 today      Lumbar Exercises: Standing   Other Standing Lumbar Exercises Step ups 4 inch 2x10; lateral step ups 2x10 each side;                    PT Education - 04/08/21 1159     Education Details HE and symptom  mangement    Person(s) Educated Patient    Methods Demonstration;Tactile cues;Explanation;Verbal cues    Comprehension Returned demonstration;Verbal cues required;Verbalized understanding;Tactile cues required              PT Short Term Goals - 04/08/21 1331       PT SHORT TERM GOAL #1   Title Patient will perfrom all 5 stands in 14 seconds    Baseline Perfromed all 5x goal met and goal updated    Status Revised    Target Date 06/03/21      PT SHORT TERM GOAL #2   Title Patient will increase bilateral LE strength to 4+/5    Baseline goal met and revised    Status Revised    Target Date 06/03/21      PT SHORT TERM GOAL #3   Title Patients will perfrom narrow base of support with SBA    Baseline perfroming on air-ex mat    Time 4    Period Weeks    Status On-going      PT SHORT TERM GOAL #4   Title Patient will increase 6 min walk ttest to 600'    Period Weeks    Status New    Target Date 06/03/21               PT Long Term Goals - 02/08/21 1400       PT LONG TERM GOAL #1   Title Patient will ambualte 1000' on 6 min walk test in order to improve ability to ambualte int he community    Time 8    Period Weeks    Status New    Target Date 04/05/21      PT LONG TERM GOAL #2   Title Patient will demonstrate a 30 on the BERG balance scale    Time 6    Period Weeks    Status New    Target Date 03/22/21      PT LONG TERM GOAL #3   Title Patient wil be independent with a gym program to improve strength and endurance    Time  6    Period Weeks    Status New    Target Date 03/22/21                   Plan - 04/08/21 1322     Clinical Impression Statement Patient is making good progress. His strength has iproved with all LE motion. His grip is about the same. He increased his sit to stand time. He continues to have days where he can not do much. He did not do much moving over the weekend. He is doing some walking outside the house though with his  walker. He would benefit from further skilled therapy 2W8 to continue to improve fucntional mobility and endurance. Today he perfromed high level balance exercises with minimal assistance for balance. He also did better tolerating his interval training. His HR remains consistnet despite therapy advancing difficulty.    Personal Factors and Comorbidities Fitness;Past/Current Experience;Comorbidity 3+;Time since onset of injury/illness/exacerbation    Comorbidities DMII with neuropathy, CAD, left knee pain at times    Examination-Activity Limitations Bed Mobility;Carry;Squat;Sit;Stairs;Lift;Locomotion Level;Other    Examination-Participation Restrictions Cleaning;Community Activity;Driving;Laundry;Shop    Stability/Clinical Decision Making Evolving/Moderate complexity    Clinical Decision Making High    Rehab Potential Good    PT Frequency 2x / week    PT Duration 8 weeks    PT Treatment/Interventions ADLs/Self Care Home Management;Functional mobility training;Therapeutic activities;Therapeutic exercise;Neuromuscular re-education;Gait training;Stair training;Patient/family education;Manual techniques;Passive range of motion    PT Next Visit Plan continue with balance training; continue to progress strengthein; review UE and LE HEP to increase intensity, Nu-step endurance 5 intervals of 2 minute at level 3 next    PT Home Exercise Plan [facility]    Consulted and Agree with Plan of Care Patient             Patient will benefit from skilled therapeutic intervention in order to improve the following deficits and impairments:  Abnormal gait, Cardiopulmonary status limiting activity, Decreased mobility, Decreased activity tolerance, Decreased endurance, Decreased strength, Difficulty walking, Decreased range of motion, Improper body mechanics, Postural dysfunction  Visit Diagnosis: Other abnormalities of gait and mobility - Plan: PT plan of care cert/re-cert  Difficulty in walking, not elsewhere  classified - Plan: PT plan of care cert/re-cert  Muscle weakness (generalized) - Plan: PT plan of care cert/re-cert     Problem List Patient Active Problem List   Diagnosis Date Noted   Adjustment disorder 01/22/2021   Chronic back pain 10/22/2020   Peripheral vertigo 05/15/2020   Dermatitis 01/04/2020   Unintentional weight loss with loose stools 05/19/2019   Low vitamin B12 level 03/29/2019   Heme positive stool 03/14/2019   Normocytic anemia 03/14/2019   Chronic diastolic heart failure ([name]) 02/08/2019   Diabetic retinopathy ([place]) 09/08/2018   Physical debility 05/24/2018   Varicose veins of both lower extremities 03/01/2018   Fatigue due to exertion 10/14/2017   GERD (gastroesophageal reflux disease) 08/25/2017   CAD in native artery 04/14/2017   Hyperlipidemia associated with type 2 diabetes mellitus ([place]) 04/14/2017   Diabetic peripheral neuropathy associated with type 2 diabetes mellitus ([place]) 08/17/2016   T2DM (type 2 diabetes mellitus) ([place]) 05/02/2016   Hypertension associated with diabetes ([place]) 05/02/2016    Carney Living PT DPT  04/08/2021, 4:20 PM  [facility] [address] [place], [place], [address] Phone: [phone]   Fax:  [phone]  Name: Jeffrey [name] MRN: [id number] Date of Birth: [birth year]

## 2021-04-10 ENCOUNTER — Ambulatory Visit (HOSPITAL_BASED_OUTPATIENT_CLINIC_OR_DEPARTMENT_OTHER): Payer: HMO | Admitting: Physical Therapy

## 2021-04-13 ENCOUNTER — Other Ambulatory Visit: Payer: Self-pay | Admitting: Family Medicine

## 2021-04-17 ENCOUNTER — Encounter (HOSPITAL_BASED_OUTPATIENT_CLINIC_OR_DEPARTMENT_OTHER): Payer: HMO | Admitting: Physical Therapy

## 2021-04-22 ENCOUNTER — Encounter (HOSPITAL_BASED_OUTPATIENT_CLINIC_OR_DEPARTMENT_OTHER): Payer: Self-pay | Admitting: Physical Therapy

## 2021-04-22 ENCOUNTER — Other Ambulatory Visit: Payer: Self-pay

## 2021-04-22 ENCOUNTER — Ambulatory Visit (HOSPITAL_BASED_OUTPATIENT_CLINIC_OR_DEPARTMENT_OTHER): Payer: HMO | Attending: Family Medicine | Admitting: Physical Therapy

## 2021-04-22 DIAGNOSIS — R262 Difficulty in walking, not elsewhere classified: Secondary | ICD-10-CM | POA: Diagnosis not present

## 2021-04-22 DIAGNOSIS — M6281 Muscle weakness (generalized): Secondary | ICD-10-CM

## 2021-04-22 DIAGNOSIS — R2689 Other abnormalities of gait and mobility: Secondary | ICD-10-CM | POA: Diagnosis not present

## 2021-04-22 NOTE — Therapy (Signed)
[facility] [address] [place], [place], [address] Phone: [phone]   Fax:  [phone]  Physical Therapy Treatment  Patient Details  Name: Jeffrey [name] MRN: [id number] Date of Birth: 11/30/1955 Referring Provider (PT): Dimas Chyle MD   Encounter Date: 04/22/2021   PT End of Session - 04/22/21 1201     Visit Number 15    Number of Visits 30    Date for PT Re-Evaluation 06/03/21    Authorization Type HTA 2022 30 dollars    PT Start Time 1150    PT Stop Time 7035    PT Time Calculation (min) 43 min    Activity Tolerance Patient tolerated treatment well    Behavior During Therapy [facility] for tasks assessed/performed             Past Medical History:  Diagnosis Date   Cardiac arrest ([place]) 05/22/2016   Cataract    CHF (congestive heart failure) ([place])    Diabetes mellitus without complication ([place])    type 2   Diabetic retinopathy ([place])    Hyperlipidemia    Hypertension    Memory loss    mild   Myocardial infarct ([place]) 2105   Retinopathy    Both eyes   S/P primary angioplasty with coronary stent 05/22/2016   Vitamin B12 deficiency    Vitreous hemorrhage of left eye ([place])    proliferative diabetic retinopathy    Past Surgical History:  Procedure Laterality Date   ANGIOPLASTY     2015   COLONOSCOPY     multiple   EYE SURGERY     PARS PLANA VITRECTOMY Left 03/15/2020   Procedure: PARS PLANA VITRECTOMY WITH 25 GAUGE;  Surgeon: [name], MD;  Location: [facility];  Service: Ophthalmology;  Laterality: Left;   PHOTOCOAGULATION WITH LASER Left 03/15/2020   Procedure: PHOTOCOAGULATION WITH LASER; INTRAVITREAL INJECTION OF AVASTIN;  Surgeon: [name], MD;  Location: [facility];  Service: Ophthalmology;  Laterality: Left;   REFRACTIVE SURGERY     UPPER GI ENDOSCOPY     several   VITRECTOMY      There were no vitals filed for this visit.   Subjective Assessment - 04/22/21 1157     Subjective Patient has not  been able to come for 3 visits. He fels like he has gone backwards from that point. It has been raining so he has not been able to walk. He has not been getting much sleep either because of roadwork in front of his house. His knees are hurting today,    Pertinent History CAD; MI 2017; DMII with peripheral neuropathy    How long can you stand comfortably? depends on the day    How long can you walk comfortably? has walked a 1/4 mile but it took 2 hours    Diagnostic tests lumbar x-ray: recent Facet hypertrophy at L4-5 and L5-S1.  Aortic atherosclerosis    Currently in Pain? Yes    Pain Score 8     Pain Location Knee    Pain Orientation Right;Left    Pain Type Chronic pain    Pain Onset 1 to 4 weeks ago    Pain Frequency Intermittent    Aggravating Factors  pain in the morning    Pain Relieving Factors ibuprofin,    Effect of Pain on Daily Activities difficulty sleeping at night  [facility] Adult PT Treatment/Exercise - 04/22/21 0001       High Level Balance   High Level Balance Comments hold narrow base air-ex eyes open could not do closed today. Min to mod a for balance 3x20 sec hold; Foward/Backwards rocking and hold; tow touching on step for balance;      Lumbar Exercises: Aerobic   Nustep 2 min 4  intervals 10 sec rest at level 3 witremained between 72 and 76 .L2 today      Lumbar Exercises: Seated   Other Seated Lumbar Exercises bilateral er 2x15 red; shoulder flexion 1lb wnad 3x10; horizontal abdcution 2x10 red; Seated clamshell x20 yellow                     PT Education - 04/22/21 1201     Education Details reviewed home exercises    Person(s) Educated Patient    Methods Explanation;Demonstration;Tactile cues;Verbal cues    Comprehension Verbalized understanding;Returned demonstration;Verbal cues required;Tactile cues required              PT Short Term Goals - 04/08/21 1331       PT SHORT TERM GOAL #1    Title Patient will perfrom all 5 stands in 14 seconds    Baseline Perfromed all 5x goal met and goal updated    Status Revised    Target Date 06/03/21      PT SHORT TERM GOAL #2   Title Patient will increase bilateral LE strength to 4+/5    Baseline goal met and revised    Status Revised    Target Date 06/03/21      PT SHORT TERM GOAL #3   Title Patients will perfrom narrow base of support with SBA    Baseline perfroming on air-ex mat    Time 4    Period Weeks    Status On-going      PT SHORT TERM GOAL #4   Title Patient will increase 6 min walk ttest to 600'    Period Weeks    Status New    Target Date 06/03/21               PT Long Term Goals - 02/08/21 1400       PT LONG TERM GOAL #1   Title Patient will ambualte 1000' on 6 min walk test in order to improve ability to ambualte int he community    Time 8    Period Weeks    Status New    Target Date 04/05/21      PT LONG TERM GOAL #2   Title Patient will demonstrate a 30 on the BERG balance scale    Time 6    Period Weeks    Status New    Target Date 03/22/21      PT LONG TERM GOAL #3   Title Patient wil be independent with a gym program to improve strength and endurance    Time 6    Period Weeks    Status New    Target Date 03/22/21                   Plan - 04/22/21 1217     Clinical Impression Statement Patient was discoruaged by not being able to come over the past few weeks./ Despite not being able to come he did well. His HR stayed consitent with interval training. We will advance him back to where he was next visit. This visit we started  a little lighter because he had no been here. He tolerated ther-ex well.    Personal Factors and Comorbidities Fitness;Past/Current Experience;Comorbidity 3+;Time since onset of injury/illness/exacerbation    Comorbidities DMII with neuropathy, CAD, left knee pain at times    Examination-Activity Limitations Bed  Mobility;Carry;Squat;Sit;Stairs;Lift;Locomotion Level;Other    Examination-Participation Restrictions Cleaning;Community Activity;Driving;Laundry;Shop    Stability/Clinical Decision Making Evolving/Moderate complexity    Clinical Decision Making High    Rehab Potential Good    PT Frequency 2x / week    PT Duration 8 weeks    PT Treatment/Interventions ADLs/Self Care Home Management;Functional mobility training;Therapeutic activities;Therapeutic exercise;Neuromuscular re-education;Gait training;Stair training;Patient/family education;Manual techniques;Passive range of motion    PT Next Visit Plan continue with balance training; continue to progress strengthein; review UE and LE HEP to increase intensity, Nu-step endurance 5 intervals of 2 minute at level 3 next    PT Home Exercise Plan [facility]    Consulted and Agree with Plan of Care Patient             Patient will benefit from skilled therapeutic intervention in order to improve the following deficits and impairments:  Abnormal gait, Cardiopulmonary status limiting activity, Decreased mobility, Decreased activity tolerance, Decreased endurance, Decreased strength, Difficulty walking, Decreased range of motion, Improper body mechanics, Postural dysfunction  Visit Diagnosis: Other abnormalities of gait and mobility  Difficulty in walking, not elsewhere classified  Muscle weakness (generalized)     Problem List Patient Active Problem List   Diagnosis Date Noted   Adjustment disorder 01/22/2021   Chronic back pain 10/22/2020   Peripheral vertigo 05/15/2020   Dermatitis 01/04/2020   Unintentional weight loss with loose stools 05/19/2019   Low vitamin B12 level 03/29/2019   Heme positive stool 03/14/2019   Normocytic anemia 03/14/2019   Chronic diastolic heart failure ([place]) 02/08/2019   Diabetic retinopathy ([place]) 09/08/2018   Physical debility 05/24/2018   Varicose veins of both lower extremities 03/01/2018   Fatigue due to  exertion 10/14/2017   GERD (gastroesophageal reflux disease) 08/25/2017   CAD in native artery 04/14/2017   Hyperlipidemia associated with type 2 diabetes mellitus ([place]) 04/14/2017   Diabetic peripheral neuropathy associated with type 2 diabetes mellitus ([place]) 08/17/2016   T2DM (type 2 diabetes mellitus) ([place]) 05/02/2016   Hypertension associated with diabetes ([place]) 05/02/2016    Carney Living, PT DPT  04/22/2021, 12:59 PM  [facility] [address] [place], [place], [address] Phone: [phone]   Fax:  [phone]  Name: Jeffrey [name] MRN: [id number] Date of Birth: [birth year]

## 2021-04-23 ENCOUNTER — Encounter: Payer: Self-pay | Admitting: Family Medicine

## 2021-04-23 ENCOUNTER — Ambulatory Visit (INDEPENDENT_AMBULATORY_CARE_PROVIDER_SITE_OTHER): Payer: HMO | Admitting: Family Medicine

## 2021-04-23 VITALS — BP 103/68 | HR 70 | Temp 97.3°F | Ht 68.0 in | Wt 157.0 lb

## 2021-04-23 DIAGNOSIS — R5381 Other malaise: Secondary | ICD-10-CM | POA: Diagnosis not present

## 2021-04-23 DIAGNOSIS — I152 Hypertension secondary to endocrine disorders: Secondary | ICD-10-CM | POA: Diagnosis not present

## 2021-04-23 DIAGNOSIS — Z23 Encounter for immunization: Secondary | ICD-10-CM | POA: Diagnosis not present

## 2021-04-23 DIAGNOSIS — Z794 Long term (current) use of insulin: Secondary | ICD-10-CM | POA: Diagnosis not present

## 2021-04-23 DIAGNOSIS — E1142 Type 2 diabetes mellitus with diabetic polyneuropathy: Secondary | ICD-10-CM

## 2021-04-23 DIAGNOSIS — E1159 Type 2 diabetes mellitus with other circulatory complications: Secondary | ICD-10-CM | POA: Diagnosis not present

## 2021-04-23 LAB — POCT GLYCOSYLATED HEMOGLOBIN (HGB A1C): Hemoglobin A1C: 7.4 % — AB (ref 4.0–5.6)

## 2021-04-23 NOTE — Patient Instructions (Addendum)
It was very nice to see you today!  Your A1c looks good today.  I am glad that you doing well.  No changes today.  We will give you a flu shot today.  Please talk to your pharmacy about getting a COVID booster for the omicron variant.  I will see back in 3 months.  Come back to see me sooner if needed.  Take care, Dr [name]  PLEASE NOTE:  If you had any lab tests please let us know if you have not heard back within a few days. You may see your results on mychart before we have a chance to review them but we will give you a call once they are reviewed by [place]. If we ordered any referrals today, please let us know if you have not heard from their office within the next week.   Please try these tips to maintain a healthy lifestyle:  Eat at least 3 REAL meals and 1-2 snacks per day.  Aim for no more than 5 hours between eating.  If you eat breakfast, please do so within one hour of getting up.   Each meal should contain half fruits/vegetables, one quarter protein, and one quarter carbs (no bigger than a computer mouse)  Cut down on sweet beverages. This includes juice, soda, and sweet tea.   Drink at least 1 glass of water with each meal and aim for at least 8 glasses per day  Exercise at least 150 minutes every week.

## 2021-04-23 NOTE — Assessment & Plan Note (Signed)
Well-controlled.  Continue Coreg 3.125 mg twice daily and spironolactone 25 mg daily.

## 2021-04-23 NOTE — Progress Notes (Signed)
   Jeffrey [name] is a 65 y.o. male who presents today for an office visit.  Assessment/Plan:  Chronic Problems Addressed Today: [name] A1c stable 7.4.  Continue metformin 1000 mg twice daily and Lantus 30 units daily.  Follow-up in 3 months.  Diabetic foot exam performed today without obvious ulcerations or deformities.  Physical debility Working with physical therapy and doing well.  Hypertension associated with diabetes (Jeffrey [name]) Well-controlled.  Continue Coreg 3.125 mg twice daily and spironolactone 25 mg daily.  Flu vaccine given today.    Subjective:  HPI:  He is here to recheck his A1c and feet check. He is accompanied by his brother today. He is tolerating his medication well without any side effects.         Objective:  Physical Exam: BP 103/68   Pulse 70   Temp (!) 97.3 F (36.3 C) (Temporal)   Ht '5\' 8"'$  (1.727 m)   Wt 157 lb (71.2 kg)   SpO2 100%   BMI 23.87 kg/m   Gen: No acute distress, resting comfortably CV: Regular rate and rhythm with no murmurs appreciated Pulm: Normal work of breathing, clear to auscultation bilaterally with no crackles, wheezes, or rhonchi MSK: Distal loss of sensation bilateral feet.  Pulses intact.  Full range of motion.  No areas of skin breakdown. Neuro: Grossly normal, moves all extremities Psych: Normal affect and thought content       I,Jeffrey [name],acting as a scribe for Jeffrey Chyle, MD.,have documented all relevant documentation on the behalf of Jeffrey Chyle, MD,as directed by  Jeffrey Chyle, MD while in the presence of Jeffrey Chyle, MD.   I, Jeffrey Chyle, MD, have reviewed all documentation for this visit. The documentation on 04/23/21 for the exam, diagnosis, procedures, and orders are all accurate and complete.  Jeffrey [name]. Jeffrey Pain, MD 04/23/2021 10:05 AM

## 2021-04-23 NOTE — Assessment & Plan Note (Signed)
A1c stable 7.4.  Continue metformin 1000 mg twice daily and Lantus 30 units daily.  Follow-up in 3 months.  Diabetic foot exam performed today without obvious ulcerations or deformities.

## 2021-04-23 NOTE — Assessment & Plan Note (Signed)
Working with physical therapy and doing well.

## 2021-04-24 ENCOUNTER — Ambulatory Visit (HOSPITAL_BASED_OUTPATIENT_CLINIC_OR_DEPARTMENT_OTHER): Payer: HMO | Admitting: Physical Therapy

## 2021-04-24 ENCOUNTER — Other Ambulatory Visit: Payer: Self-pay

## 2021-04-24 ENCOUNTER — Encounter (HOSPITAL_BASED_OUTPATIENT_CLINIC_OR_DEPARTMENT_OTHER): Payer: Self-pay | Admitting: Physical Therapy

## 2021-04-24 DIAGNOSIS — R262 Difficulty in walking, not elsewhere classified: Secondary | ICD-10-CM

## 2021-04-24 DIAGNOSIS — M6281 Muscle weakness (generalized): Secondary | ICD-10-CM

## 2021-04-24 DIAGNOSIS — R2689 Other abnormalities of gait and mobility: Secondary | ICD-10-CM

## 2021-04-25 ENCOUNTER — Encounter (HOSPITAL_BASED_OUTPATIENT_CLINIC_OR_DEPARTMENT_OTHER): Payer: Self-pay | Admitting: Physical Therapy

## 2021-04-25 NOTE — Therapy (Signed)
[facility] [address] [place], [place], [address] Phone: [phone]   Fax:  [phone]  Physical Therapy Treatment  Patient Details  Name: Jeffrey [name] MRN: [id number] Date of Birth: 1956-06-07 Referring Provider (PT): Dimas Chyle MD   Encounter Date: 04/24/2021   PT End of Session - 04/24/21 2102     Visit Number 16    Number of Visits 30    Date for PT Re-Evaluation 06/03/21    Authorization Type [employer] 2022 30 dollars    PT Start Time 1975    PT Stop Time 1225    PT Time Calculation (min) 40 min    Activity Tolerance Patient tolerated treatment well    Behavior During Therapy [facility] for tasks assessed/performed             Past Medical History:  Diagnosis Date   Cardiac arrest ([place]) 05/22/2016   Cataract    CHF (congestive heart failure) ([place])    Diabetes mellitus without complication ([place])    type 2   Diabetic retinopathy ([place])    Hyperlipidemia    Hypertension    Memory loss    mild   Myocardial infarct ([place]) 2105   Retinopathy    Both eyes   S/P primary angioplasty with coronary stent 05/22/2016   Vitamin B12 deficiency    Vitreous hemorrhage of left eye ([place])    proliferative diabetic retinopathy    Past Surgical History:  Procedure Laterality Date   ANGIOPLASTY     2015   COLONOSCOPY     multiple   EYE SURGERY     PARS PLANA VITRECTOMY Left 03/15/2020   Procedure: PARS PLANA VITRECTOMY WITH 25 GAUGE;  Surgeon: [name], MD;  Location: [facility];  Service: Ophthalmology;  Laterality: Left;   PHOTOCOAGULATION WITH LASER Left 03/15/2020   Procedure: PHOTOCOAGULATION WITH LASER; INTRAVITREAL INJECTION OF AVASTIN;  Surgeon: [name], MD;  Location: [facility];  Service: Ophthalmology;  Laterality: Left;   REFRACTIVE SURGERY     UPPER GI ENDOSCOPY     several   VITRECTOMY      There were no vitals filed for this visit.   Subjective Assessment - 04/24/21 2057     Subjective Patient  tolerated treatment well last visit . He reports no significant pain. he would like to try weights today.    Pertinent History CAD; MI 2017; DMII with peripheral neuropathy    How long can you stand comfortably? depends on the day    How long can you walk comfortably? has walked a 1/4 mile but it took 2 hours    Diagnostic tests lumbar x-ray: recent Facet hypertrophy at L4-5 and L5-S1.  Aortic atherosclerosis    Currently in Pain? No/denies                               [facility] Adult PT Treatment/Exercise - 04/25/21 0001       Lumbar Exercises: Aerobic   Nustep 2 min 45 intervals 10 sec rest at level 3 witremained between 72 and 84      Lumbar Exercises: Machines for Strengthening   Other Lumbar Machine Exercise cable row 5 lbs 3x10; cable shoulder extension 3x10; biceps curl 3x10 5 llbs tricpes extension 3x10 3lbs                     PT Education - 04/24/21 2101     Education Details reviewed  proper set up of gym equipment and proper exercise prescription    Person(s) Educated Patient    Methods Explanation;Tactile cues;Verbal cues;Demonstration    Comprehension Verbalized understanding;Returned demonstration;Verbal cues required;Tactile cues required              PT Short Term Goals - 04/08/21 1331       PT SHORT TERM GOAL #1   Title Patient will perfrom all 5 stands in 14 seconds    Baseline Perfromed all 5x goal met and goal updated    Status Revised    Target Date 06/03/21      PT SHORT TERM GOAL #2   Title Patient will increase bilateral LE strength to 4+/5    Baseline goal met and revised    Status Revised    Target Date 06/03/21      PT SHORT TERM GOAL #3   Title Patients will perfrom narrow base of support with SBA    Baseline perfroming on air-ex mat    Time 4    Period Weeks    Status On-going      PT SHORT TERM GOAL #4   Title Patient will increase 6 min walk ttest to 600'    Period Weeks    Status New    Target Date  06/03/21               PT Long Term Goals - 02/08/21 1400       PT LONG TERM GOAL #1   Title Patient will ambualte 1000' on 6 min walk test in order to improve ability to ambualte int he community    Time 8    Period Weeks    Status New    Target Date 04/05/21      PT LONG TERM GOAL #2   Title Patient will demonstrate a 30 on the BERG balance scale    Time 6    Period Weeks    Status New    Target Date 03/22/21      PT LONG TERM GOAL #3   Title Patient wil be independent with a gym program to improve strength and endurance    Time 6    Period Weeks    Status New    Target Date 03/22/21                   Plan - 04/24/21 2108     Clinical Impression Statement Patient tolerated gy exercises well. He is motivated to progress. therapy educated him on the ponetial for DOMS and the difference between DOMS and joint pain. He was advised to continue practiing with PT as he needs min a for balanc with standing exercises. He tried them sitting in his walker but he was unable to do it.    Personal Factors and Comorbidities Fitness;Past/Current Experience;Comorbidity 3+;Time since onset of injury/illness/exacerbation    Comorbidities DMII with neuropathy, CAD, left knee pain at times    Examination-Activity Limitations Bed Mobility;Carry;Squat;Sit;Stairs;Lift;Locomotion Level;Other    Examination-Participation Restrictions Cleaning;Community Activity;Driving;Laundry;Shop    Stability/Clinical Decision Making Evolving/Moderate complexity    Clinical Decision Making High    Rehab Potential Good    PT Frequency 2x / week    PT Duration 8 weeks    PT Treatment/Interventions ADLs/Self Care Home Management;Functional mobility training;Therapeutic activities;Therapeutic exercise;Neuromuscular re-education;Gait training;Stair training;Patient/family education;Manual techniques;Passive range of motion    PT Next Visit Plan continue with balance training; continue to progress  strengthein; review UE and LE HEP to increase intensity,  Nu-step endurance 5 intervals of 2 minute at level 3 next    PT Home Exercise Plan [facility]    Consulted and Agree with Plan of Care Patient             Patient will benefit from skilled therapeutic intervention in order to improve the following deficits and impairments:  Abnormal gait, Cardiopulmonary status limiting activity, Decreased mobility, Decreased activity tolerance, Decreased endurance, Decreased strength, Difficulty walking, Decreased range of motion, Improper body mechanics, Postural dysfunction  Visit Diagnosis: Other abnormalities of gait and mobility  Difficulty in walking, not elsewhere classified  Muscle weakness (generalized)     Problem List Patient Active Problem List   Diagnosis Date Noted   Adjustment disorder 01/22/2021   Chronic back pain 10/22/2020   Peripheral vertigo 05/15/2020   Dermatitis 01/04/2020   Unintentional weight loss with loose stools 05/19/2019   Low vitamin B12 level 03/29/2019   Heme positive stool 03/14/2019   Normocytic anemia 03/14/2019   Chronic diastolic heart failure ([place]) 02/08/2019   Diabetic retinopathy ([place]) 09/08/2018   Physical debility 05/24/2018   Varicose veins of both lower extremities 03/01/2018   Fatigue due to exertion 10/14/2017   GERD (gastroesophageal reflux disease) 08/25/2017   CAD in native artery 04/14/2017   Hyperlipidemia associated with type 2 diabetes mellitus ([place]) 04/14/2017   Diabetic peripheral neuropathy associated with type 2 diabetes mellitus ([place]) 08/17/2016   T2DM (type 2 diabetes mellitus) ([place]) 05/02/2016   Hypertension associated with diabetes ([place]) 05/02/2016    Carney Living, PT DPT  04/25/2021, 1:21 PM  [facility] [address] [place], [place], [address] Phone: [phone]   Fax:  [phone]  Name: Jeffrey [name] MRN: [id number] Date of Birth: Jul 14, 1956

## 2021-04-26 ENCOUNTER — Ambulatory Visit: Payer: HMO | Admitting: Family Medicine

## 2021-04-29 ENCOUNTER — Other Ambulatory Visit: Payer: Self-pay | Admitting: Family Medicine

## 2021-04-29 ENCOUNTER — Ambulatory Visit (HOSPITAL_BASED_OUTPATIENT_CLINIC_OR_DEPARTMENT_OTHER): Payer: HMO | Admitting: Physical Therapy

## 2021-04-29 ENCOUNTER — Other Ambulatory Visit: Payer: Self-pay

## 2021-04-29 DIAGNOSIS — R2689 Other abnormalities of gait and mobility: Secondary | ICD-10-CM | POA: Diagnosis not present

## 2021-04-29 DIAGNOSIS — M6281 Muscle weakness (generalized): Secondary | ICD-10-CM

## 2021-04-29 DIAGNOSIS — R262 Difficulty in walking, not elsewhere classified: Secondary | ICD-10-CM

## 2021-04-30 ENCOUNTER — Telehealth: Payer: Self-pay

## 2021-04-30 NOTE — Telephone Encounter (Signed)
Sunday Spillers T. Is calling from [employer] she is a Midwife with them and has been trying to contact the patient and has had no response. Sunday Spillers is concerned for the patient and would like a call back.

## 2021-04-30 NOTE — Therapy (Signed)
[facility] [address] [place], [place], [address] Phone: [phone]   Fax:  [phone]  Physical Therapy Treatment  Patient Details  Name: Jeffrey [name] MRN: [id number] Date of Birth: 11/05/1955 Referring Provider (PT): Caleb Parker MD   Encounter Date: 04/29/2021   PT End of Session - 04/29/21 1216     Visit Number 17    Number of Visits 30    Date for PT Re-Evaluation 06/03/21    Authorization Type HTA 2022 30 dollars    PT Start Time 1148    PT Stop Time 1229    PT Time Calculation (min) 41 min    Activity Tolerance Patient tolerated treatment well    Behavior During Therapy [facility] for tasks assessed/performed             Past Medical History:  Diagnosis Date   Cardiac arrest ([name]) 05/22/2016   Cataract    CHF (congestive heart failure) ([name])    Diabetes mellitus without complication ([name])    type 2   Diabetic retinopathy ([name])    Hyperlipidemia    Hypertension    Memory loss    mild   Myocardial infarct ([name]) 2105   Retinopathy    Both eyes   S/P primary angioplasty with coronary stent 05/22/2016   Vitamin B12 deficiency    Vitreous hemorrhage of left eye ([name])    proliferative diabetic retinopathy    Past Surgical History:  Procedure Laterality Date   ANGIOPLASTY     2015   COLONOSCOPY     multiple   EYE SURGERY     PARS PLANA VITRECTOMY Left 03/15/2020   Procedure: PARS PLANA VITRECTOMY WITH 25 GAUGE;  Surgeon: Sanders, Jason, MD;  [facility];  Service: Ophthalmology;  Laterality: Left;   PHOTOCOAGULATION WITH LASER Left 03/15/2020   Procedure: PHOTOCOAGULATION WITH LASER; INTRAVITREAL INJECTION OF AVASTIN;  Surgeon: Sanders, Jason, MD;  Location[facility];  Service: Ophthalmology;  Laterality: Left;   REFRACTIVE SURGERY     UPPER GI ENDOSCOPY     several   VITRECTOMY      There were no vitals filed for this visit.   Subjective Assessment - 04/29/21 1211     Subjective Patient had his  booster shot for COVID.  He felt very sore all weekend in all his joints. He feels better today. His brother reports he has been letting his ealker too far out in front of him.    Pertinent History CAD; MI 2017; DMII with peripheral neuropathy    How long can you stand comfortably? depends on the day    How long can you walk comfortably? has walked a 1/4 mile but it took 2 hours    Diagnostic tests lumbar x-ray: recent Facet hypertrophy at L4-5 and L5-S1.  Aortic atherosclerosis    Currently in Pain? No/denies                               [facility] Adult PT Treatment/Exercise - 04/30/21 0001       Ambulation/Gait   Gait Comments 100'x2 walked in the gym. Cuing to stand up straight but very little cuing required      Lumbar Exercises: Aerobic   Nustep 2 min intervals until 6 min then a 4 min interval all with no major incease in HR. Therapy will advance resistance next visit.      Lumbar Exercises: Machines for Strengthening   Cybex   Lumbar Extension 3x10 10 lbs fatigue noted    Leg Press 3x10 30 lbs    Other Lumbar Machine Exercise hip abduction 3x15 20 lbs  Patient assisted on and off all machines with cuing for technique and posture                     PT Education - 04/29/21 1216     Education Details reviewed gym leg exercises    Person(s) Educated Patient    Methods Explanation;Demonstration;Tactile cues;Verbal cues    Comprehension Verbalized understanding;Returned demonstration;Verbal cues required;Tactile cues required              PT Short Term Goals - 04/08/21 1331       PT SHORT TERM GOAL #1   Title Patient will perfrom all 5 stands in 14 seconds    Baseline Perfromed all 5x goal met and goal updated    Status Revised    Target Date 06/03/21      PT SHORT TERM GOAL #2   Title Patient will increase bilateral LE strength to 4+/5    Baseline goal met and revised    Status Revised    Target Date 06/03/21      PT SHORT TERM GOAL #3    Title Patients will perfrom narrow base of support with SBA    Baseline perfroming on air-ex mat    Time 4    Period Weeks    Status On-going      PT SHORT TERM GOAL #4   Title Patient will increase 6 min walk ttest to 600'    Period Weeks    Status New    Target Date 06/03/21               PT Long Term Goals - 02/08/21 1400       PT LONG TERM GOAL #1   Title Patient will ambualte 1000' on 6 min walk test in order to improve ability to ambualte int he community    Time 8    Period Weeks    Status New    Target Date 04/05/21      PT LONG TERM GOAL #2   Title Patient will demonstrate a 30 on the BERG balance scale    Time 6    Period Weeks    Status New    Target Date 03/22/21      PT LONG TERM GOAL #3   Title Patient wil be independent with a gym program to improve strength and endurance    Time 6    Period Weeks    Status New    Target Date 03/22/21                   Plan - 04/29/21 1217     Clinical Impression Statement Patient had no significant increase in HR with nustep depsite making interbvals longer. Therapy will increase his resitance next visit. He did report fatigue with leg extension machine. He tolerated all other gym machines well. Therapy will continue to advaace patient as tolerated.    Personal Factors and Comorbidities Fitness;Past/Current Experience;Comorbidity 3+;Time since onset of injury/illness/exacerbation    Comorbidities DMII with neuropathy, CAD, left knee pain at times    Examination-Activity Limitations Bed Mobility;Carry;Squat;Sit;Stairs;Lift;Locomotion Level;Other    Examination-Participation Restrictions Cleaning;Community Activity;Driving;Laundry;Shop    Stability/Clinical Decision Making Evolving/Moderate complexity    Clinical Decision Making High    Rehab Potential Good    PT Frequency 2x / week      PT Duration 8 weeks    PT Treatment/Interventions ADLs/Self Care Home Management;Functional mobility  training;Therapeutic activities;Therapeutic exercise;Neuromuscular re-education;Gait training;Stair training;Patient/family education;Manual techniques;Passive range of motion    PT Next Visit Plan continue with balance training; continue to progress strengthein; review UE and LE HEP to increase intensity, Nu-step endurance 5 intervals of 2 minute at level 3 next    PT Home Exercise Plan [facility]    Consulted and Agree with Plan of Care Patient             Patient will benefit from skilled therapeutic intervention in order to improve the following deficits and impairments:  Abnormal gait, Cardiopulmonary status limiting activity, Decreased mobility, Decreased activity tolerance, Decreased endurance, Decreased strength, Difficulty walking, Decreased range of motion, Improper body mechanics, Postural dysfunction  Visit Diagnosis: Other abnormalities of gait and mobility  Difficulty in walking, not elsewhere classified  Muscle weakness (generalized)     Problem List Patient Active Problem List   Diagnosis Date Noted   Adjustment disorder 01/22/2021   Chronic back pain 10/22/2020   Peripheral vertigo 05/15/2020   Dermatitis 01/04/2020   Unintentional weight loss with loose stools 05/19/2019   Low vitamin B12 level 03/29/2019   Heme positive stool 03/14/2019   Normocytic anemia 03/14/2019   Chronic diastolic heart failure ([place]) 02/08/2019   Diabetic retinopathy ([place]) 09/08/2018   Physical debility 05/24/2018   Varicose veins of both lower extremities 03/01/2018   Fatigue due to exertion 10/14/2017   GERD (gastroesophageal reflux disease) 08/25/2017   CAD in native artery 04/14/2017   Hyperlipidemia associated with type 2 diabetes mellitus ([place]) 04/14/2017   Diabetic peripheral neuropathy associated with type 2 diabetes mellitus ([place]) 08/17/2016   T2DM (type 2 diabetes mellitus) ([place]) 05/02/2016   Hypertension associated with diabetes ([place]) 05/02/2016    Carney Living, PT  DPT  04/30/2021, 10:14 AM  [facility] [address] [place], [place], [address] Phone: [phone]   Fax:  [phone]  Name: Jeffrey [name] MRN: [id number] Date of Birth: 01-01-1956

## 2021-04-30 NOTE — Telephone Encounter (Signed)
Spoke with Eyvonne Mechanic from [employer],she is trying to reach patient. ok to give her phone number to patient to call her back

## 2021-04-30 NOTE — Telephone Encounter (Signed)
.  LVM to patient to call back

## 2021-05-01 ENCOUNTER — Other Ambulatory Visit: Payer: Self-pay

## 2021-05-01 ENCOUNTER — Ambulatory Visit (HOSPITAL_BASED_OUTPATIENT_CLINIC_OR_DEPARTMENT_OTHER): Payer: HMO | Admitting: Physical Therapy

## 2021-05-01 DIAGNOSIS — R2689 Other abnormalities of gait and mobility: Secondary | ICD-10-CM | POA: Diagnosis not present

## 2021-05-01 DIAGNOSIS — M6281 Muscle weakness (generalized): Secondary | ICD-10-CM

## 2021-05-01 DIAGNOSIS — R262 Difficulty in walking, not elsewhere classified: Secondary | ICD-10-CM

## 2021-05-01 NOTE — Telephone Encounter (Signed)
Spoke with patient give Jeffrey [name] information to call her back  Patient stated he spoke with Jeffrey [name] already  Doing good and going to PT today

## 2021-05-01 NOTE — Therapy (Signed)
[facility] [address] [place], [place], [address] Phone: [phone]   Fax:  [phone]  Physical Therapy Treatment  Patient Details  Name: Jeffrey [name] MRN: [id number] Date of Birth: [birth year] Referring Provider (PT): Dimas Chyle MD   Encounter Date: 05/01/2021   PT End of Session - 05/01/21 1209     Visit Number 18    Number of Visits 30    Date for PT Re-Evaluation 06/03/21    Authorization Type [employer] 2022 30 dollars    PT Start Time 1148    PT Stop Time 5400    PT Time Calculation (min) 42 min    Activity Tolerance Patient tolerated treatment well    Behavior During Therapy [facility] for tasks assessed/performed             Past Medical History:  Diagnosis Date   Cardiac arrest ([place]) 05/22/2016   Cataract    CHF (congestive heart failure) ([place])    Diabetes mellitus without complication ([place])    type 2   Diabetic retinopathy ([place])    Hyperlipidemia    Hypertension    Memory loss    mild   Myocardial infarct ([place]) 2105   Retinopathy    Both eyes   S/P primary angioplasty with coronary stent 05/22/2016   Vitamin B12 deficiency    Vitreous hemorrhage of left eye ([place])    proliferative diabetic retinopathy    Past Surgical History:  Procedure Laterality Date   ANGIOPLASTY     2015   COLONOSCOPY     multiple   EYE SURGERY     PARS PLANA VITRECTOMY Left 03/15/2020   Procedure: PARS PLANA VITRECTOMY WITH 25 GAUGE;  Surgeon: [name], MD;  Location: [facility];  Service: Ophthalmology;  Laterality: Left;   PHOTOCOAGULATION WITH LASER Left 03/15/2020   Procedure: PHOTOCOAGULATION WITH LASER; INTRAVITREAL INJECTION OF AVASTIN;  Surgeon: [name], MD;  Location: [facility];  Service: Ophthalmology;  Laterality: Left;   REFRACTIVE SURGERY     UPPER GI ENDOSCOPY     several   VITRECTOMY      There were no vitals filed for this visit.                      [facility] Adult PT Treatment/Exercise  - 05/01/21 0001       High Level Balance   High Level Balance Comments tandem walking 2 laps; marching 2 laps; long step 2 laps; changing speeds 65'      Lumbar Exercises: Aerobic   Nustep 3 min intervals 3 intervals; HR 72      Lumbar Exercises: Machines for Strengthening   Other Lumbar Machine Exercise shoulder extension 3x10 15; row 3x15 lbs                       PT Short Term Goals - 04/08/21 1331       PT SHORT TERM GOAL #1   Title Patient will perfrom all 5 stands in 14 seconds    Baseline Perfromed all 5x goal met and goal updated    Status Revised    Target Date 06/03/21      PT SHORT TERM GOAL #2   Title Patient will increase bilateral LE strength to 4+/5    Baseline goal met and revised    Status Revised    Target Date 06/03/21      PT SHORT TERM GOAL #3   Title Patients will perfrom  narrow base of support with SBA    Baseline perfroming on air-ex mat    Time 4    Period Weeks    Status On-going      PT SHORT TERM GOAL #4   Title Patient will increase 6 min walk ttest to 600'    Period Weeks    Status New    Target Date 06/03/21               PT Long Term Goals - 02/08/21 1400       PT LONG TERM GOAL #1   Title Patient will ambualte 1000' on 6 min walk test in order to improve ability to ambualte int he community    Time 8    Period Weeks    Status New    Target Date 04/05/21      PT LONG TERM GOAL #2   Title Patient will demonstrate a 30 on the BERG balance scale    Time 6    Period Weeks    Status New    Target Date 03/22/21      PT LONG TERM GOAL #3   Title Patient wil be independent with a gym program to improve strength and endurance    Time 6    Period Weeks    Status New    Target Date 03/22/21                   Plan - 05/01/21 1213     Clinical Impression Statement Patient tolerated treatment well. Therapy increased his interval to 3 min but he still had no major difference in HR.Patient worked on  [employer] activity including tandem walking and changin speeds. He did well. He had no significat fatigue.    Personal Factors and Comorbidities Fitness;Past/Current Experience;Comorbidity 3+;Time since onset of injury/illness/exacerbation    Comorbidities DMII with neuropathy, CAD, left knee pain at times    Examination-Activity Limitations Bed Mobility;Carry;Squat;Sit;Stairs;Lift;Locomotion Level;Other    Stability/Clinical Decision Making Evolving/Moderate complexity    Clinical Decision Making High    Rehab Potential Good    PT Frequency 2x / week    PT Duration 8 weeks             Patient will benefit from skilled therapeutic intervention in order to improve the following deficits and impairments:  Abnormal gait, Cardiopulmonary status limiting activity, Decreased mobility, Decreased activity tolerance, Decreased endurance, Decreased strength, Difficulty walking, Decreased range of motion, Improper body mechanics, Postural dysfunction  Visit Diagnosis: Other abnormalities of gait and mobility  Difficulty in walking, not elsewhere classified  Muscle weakness (generalized)     Problem List Patient Active Problem List   Diagnosis Date Noted   Adjustment disorder 01/22/2021   Chronic back pain 10/22/2020   Peripheral vertigo 05/15/2020   Dermatitis 01/04/2020   Unintentional weight loss with loose stools 05/19/2019   Low vitamin B12 level 03/29/2019   Heme positive stool 03/14/2019   Normocytic anemia 03/14/2019   Chronic diastolic heart failure ([place]) 02/08/2019   Diabetic retinopathy ([place]) 09/08/2018   Physical debility 05/24/2018   Varicose veins of both lower extremities 03/01/2018   Fatigue due to exertion 10/14/2017   GERD (gastroesophageal reflux disease) 08/25/2017   CAD in native artery 04/14/2017   Hyperlipidemia associated with type 2 diabetes mellitus ([place]) 04/14/2017   Diabetic peripheral neuropathy associated with type 2 diabetes mellitus ([place])  08/17/2016   T2DM (type 2 diabetes mellitus) ([place]) 05/02/2016   Hypertension associated with diabetes ([place])  05/02/2016    Carney Living, PT 05/01/2021, 8:33 PM  [facility] [address] [place], [place], [address] Phone: [phone]   Fax:  [phone]  Name: Jeffrey [name] MRN: [id number] Date of Birth: 09-16-1955

## 2021-05-06 ENCOUNTER — Ambulatory Visit (HOSPITAL_BASED_OUTPATIENT_CLINIC_OR_DEPARTMENT_OTHER): Payer: HMO | Admitting: Physical Therapy

## 2021-05-06 ENCOUNTER — Other Ambulatory Visit: Payer: Self-pay

## 2021-05-06 DIAGNOSIS — R262 Difficulty in walking, not elsewhere classified: Secondary | ICD-10-CM

## 2021-05-06 DIAGNOSIS — R2689 Other abnormalities of gait and mobility: Secondary | ICD-10-CM

## 2021-05-06 DIAGNOSIS — M6281 Muscle weakness (generalized): Secondary | ICD-10-CM

## 2021-05-06 NOTE — Therapy (Signed)
[place], [place], [address] Phone: [phone]   Fax:  [phone]  Physical Therapy Treatment/Progress Note   Patient Details  Name: Jeffrey [name] MRN: [id number] Date of Birth: [birth year] Referring Provider (PT): Dimas Chyle MD  Progress Note Reporting Period 03/13/21 to 05/06/2021  See note below for Objective Data and Assessment of Progress/Goals.        Encounter Date: 05/06/2021   PT End of Session - 05/06/21 2135     Visit Number 19    Number of Visits 31    Date for PT Re-Evaluation 06/17/21    Authorization Type HTA 2022 30 dollars progress note perfromed at visit 16. Kx modifier applied    PT Start Time 5498    PT Stop Time 1228    PT Time Calculation (min) 43 min    Activity Tolerance Patient tolerated treatment well    Behavior During Therapy [facility] for tasks assessed/performed             Past Medical History:  Diagnosis Date   Cardiac arrest ([place]) 05/22/2016   Cataract    CHF (congestive heart failure) ([name])    Diabetes mellitus without complication ([place])    type 2   Diabetic retinopathy ([place])    Hyperlipidemia    Hypertension    Memory loss    mild   Myocardial infarct ([place]) 2105   Retinopathy    Both eyes   S/P primary angioplasty with coronary stent 05/22/2016   Vitamin B12 deficiency    Vitreous hemorrhage of left eye ([place])    proliferative diabetic retinopathy    Past Surgical History:  Procedure Laterality Date   ANGIOPLASTY     2015   COLONOSCOPY     multiple   EYE SURGERY     PARS PLANA VITRECTOMY Left 03/15/2020   Procedure: PARS PLANA VITRECTOMY WITH 25 GAUGE;  Surgeon: [name], MD;  Location: [place];  Service: Ophthalmology;  Laterality: Left;   PHOTOCOAGULATION WITH LASER Left 03/15/2020   Procedure: PHOTOCOAGULATION WITH LASER; INTRAVITREAL INJECTION OF AVASTIN;  Surgeon: [name], MD;  Location: [place];  Service: Ophthalmology;   Laterality: Left;   REFRACTIVE SURGERY     UPPER GI ENDOSCOPY     several   VITRECTOMY      There were no vitals filed for this visit.   Subjective Assessment - 05/06/21 1227     Subjective Patient's brother reports he has had less trouble getting in and out of the shower. He cones in today with his cane. He is trying different shoes.    Pertinent History CAD; MI 2017; DMII with peripheral neuropathy    How long can you stand comfortably? depends on the day    How long can you walk comfortably? has walked a 1/4 mile but it took 2 hours    Diagnostic tests lumbar x-ray: recent Facet hypertrophy at L4-5 and L5-S1.  Aortic atherosclerosis    Currently in Pain? No/denies                [facility] PT Assessment - 05/06/21 0001       Strength   Right Hand Grip (lbs) 55    Left Hand Grip (lbs) 50    Right Hip Flexion 4/5    Right Hip ABduction 4+/5    Left Hip Flexion 4+/5    Left Hip ABduction 4+/5    Right Knee Flexion 4+/5    Right Knee Extension 4+/5  Left Knee Flexion 4+/5    Left Knee Extension 4+/5      Transfers   Five time sit to stand comments  15 sec without the walker      Ambulation/Gait   Gait Comments 300' Patient did not require a seated rest break.      Berg Balance Test   Sit to Stand Able to stand without using hands and stabilize independently    Standing Unsupported Able to stand safely 2 minutes    Sitting with Back Unsupported but Feet Supported on Floor or Stool Able to sit safely and securely 2 minutes    Stand to Sit Sits safely with minimal use of hands    Transfers Able to transfer safely, definite need of hands    Standing Unsupported with Eyes Closed Able to stand 10 seconds safely    Standing Unsupported with Feet Together Able to place feet together independently and stand for 1 minute with supervision    From Standing, Reach Forward with Outstretched Arm Can reach forward >12 cm safely (5")    From Standing Position, Pick up Object from  Floor Able to pick up shoe, needs supervision    From Standing Position, Turn to Look Behind Over each Shoulder Turn sideways only but maintains balance    Turn 360 Degrees Able to turn 360 degrees safely but slowly    Standing Unsupported, Alternately Place Feet on Step/Stool Able to complete >2 steps/needs minimal assist    Standing Unsupported, One Foot in Front Loses balance while stepping or standing    Standing on One Leg Unable to try or needs assist to prevent fall    Total Score 37                           [facility] Adult PT Treatment/Exercise - 05/06/21 0001       Self-Care   Self-Care Other Self-Care Comments    Other Self-Care Comments  reviewed the use      Lumbar Exercises: Aerobic   Stationary Bike perfromed 5 minute on L1                     PT Education - 05/06/21 2132     Education Details reviewed results of exercises    Person(s) Educated Patient    Methods Explanation;Demonstration;Tactile cues;Verbal cues    Comprehension Returned demonstration;Verbalized understanding;Verbal cues required;Tactile cues required              PT Short Term Goals - 04/08/21 1331       PT SHORT TERM GOAL #1   Title Patient will perfrom all 5 stands in 14 seconds    Baseline Perfromed all 5x goal met and goal updated    Status Revised    Target Date 06/03/21      PT SHORT TERM GOAL #2   Title Patient will increase bilateral LE strength to 4+/5    Baseline goal met and revised    Status Revised    Target Date 06/03/21      PT SHORT TERM GOAL #3   Title Patients will perfrom narrow base of support with SBA    Baseline perfroming on air-ex mat    Time 4    Period Weeks    Status On-going      PT SHORT TERM GOAL #4   Title Patient will increase 6 min walk ttest to 600'    Period Weeks  Status New    Target Date 06/03/21               PT Long Term Goals - 02/08/21 1400       PT LONG TERM GOAL #1   Title Patient will  ambualte 1000' on 6 min walk test in order to improve ability to ambualte int he community    Time 8    Period Weeks    Status New    Target Date 04/05/21      PT LONG TERM GOAL #2   Title Patient will demonstrate a 30 on the BERG balance scale    Time 6    Period Weeks    Status New    Target Date 03/22/21      PT LONG TERM GOAL #3   Title Patient wil be independent with a gym program to improve strength and endurance    Time 6    Period Weeks    Status New    Target Date 03/22/21                   Plan - 05/06/21 2138     Clinical Impression Statement Patient has made great progress. He has improved in his sit to stand test, BERG balance test, and 6 minute walk test. He is not having significant fatigue in the shower He has also begun to walk for exercise at home. He has been working on exercises in the gym including weight training. We will continue 2W6 to work on finalzing his gym program and going over a plan for him to continue at the gym. He would like to get on the tremill but he was dvised this may be unsafe as he scuffs the front of his shoe at times walking.    Personal Factors and Comorbidities Fitness;Past/Current Experience;Comorbidity 3+;Time since onset of injury/illness/exacerbation    Comorbidities DMII with neuropathy, CAD, left knee pain at times    Examination-Activity Limitations Bed Mobility;Carry;Squat;Sit;Stairs;Lift;Locomotion Level;Other    Examination-Participation Restrictions Cleaning;Community Activity;Driving;Laundry;Shop    Stability/Clinical Decision Making Evolving/Moderate complexity    Clinical Decision Making High    Rehab Potential Good    PT Frequency 2x / week    PT Treatment/Interventions ADLs/Self Care Home Management;Functional mobility training;Therapeutic activities;Therapeutic exercise;Neuromuscular re-education;Gait training;Stair training;Patient/family education;Manual techniques;Passive range of motion    PT Next Visit Plan  continue with balance training; continue to progress strengthein; review UE and LE HEP to increase intensity, Nu-step endurance 5 intervals of 2 minute at level 3 next    PT Home Exercise Plan [facility]    Consulted and Agree with Plan of Care Patient             Patient will benefit from skilled therapeutic intervention in order to improve the following deficits and impairments:  Abnormal gait, Cardiopulmonary status limiting activity, Decreased mobility, Decreased activity tolerance, Decreased endurance, Decreased strength, Difficulty walking, Decreased range of motion, Improper body mechanics, Postural dysfunction  Visit Diagnosis: Other abnormalities of gait and mobility - Plan: PT plan of care cert/re-cert  Difficulty in walking, not elsewhere classified - Plan: PT plan of care cert/re-cert  Muscle weakness (generalized) - Plan: PT plan of care cert/re-cert     Problem List Patient Active Problem List   Diagnosis Date Noted   Adjustment disorder 01/22/2021   Chronic back pain 10/22/2020   Peripheral vertigo 05/15/2020   Dermatitis 01/04/2020   Unintentional weight loss with loose stools 05/19/2019   Low vitamin B12 level 03/29/2019  Heme positive stool 03/14/2019   Normocytic anemia 03/14/2019   Chronic diastolic heart failure ([place]) 02/08/2019   Diabetic retinopathy ([place]) 09/08/2018   Physical debility 05/24/2018   Varicose veins of both lower extremities 03/01/2018   Fatigue due to exertion 10/14/2017   GERD (gastroesophageal reflux disease) 08/25/2017   CAD in native artery 04/14/2017   Hyperlipidemia associated with type 2 diabetes mellitus ([place]) 04/14/2017   Diabetic peripheral neuropathy associated with type 2 diabetes mellitus ([place]) 08/17/2016   T2DM (type 2 diabetes mellitus) ([place]) 05/02/2016   Hypertension associated with diabetes ([place]) 05/02/2016    Carney Living, PT 05/06/2021, 10:05 PM  [facility] [address] [place], [place], [address] Phone: [phone]   Fax:  [phone]  Name: Jeffrey [name] MRN: [id number] Date of Birth: 1956-01-25

## 2021-05-08 ENCOUNTER — Other Ambulatory Visit: Payer: Self-pay

## 2021-05-08 ENCOUNTER — Encounter (HOSPITAL_BASED_OUTPATIENT_CLINIC_OR_DEPARTMENT_OTHER): Payer: Self-pay | Admitting: Physical Therapy

## 2021-05-08 ENCOUNTER — Ambulatory Visit (HOSPITAL_BASED_OUTPATIENT_CLINIC_OR_DEPARTMENT_OTHER): Payer: HMO | Admitting: Physical Therapy

## 2021-05-08 DIAGNOSIS — R262 Difficulty in walking, not elsewhere classified: Secondary | ICD-10-CM

## 2021-05-08 DIAGNOSIS — R2689 Other abnormalities of gait and mobility: Secondary | ICD-10-CM | POA: Diagnosis not present

## 2021-05-08 DIAGNOSIS — M6281 Muscle weakness (generalized): Secondary | ICD-10-CM

## 2021-05-09 ENCOUNTER — Encounter (HOSPITAL_BASED_OUTPATIENT_CLINIC_OR_DEPARTMENT_OTHER): Payer: Self-pay | Admitting: Physical Therapy

## 2021-05-09 NOTE — Therapy (Signed)
[place], [place], [address] Phone: [phone]   Fax:  [phone]  Physical Therapy Treatment  Patient Details  Name: Jeffrey [name] MRN: [id number] Date of Birth: May 02, 1956 Referring Provider (PT): Dimas Chyle MD   Encounter Date: 05/08/2021   PT End of Session - 05/08/21 1216     Visit Number 20    Number of Visits 31    Date for PT Re-Evaluation 06/17/21    Authorization Type HTA 2022 30 dollars progress note perfromed at visit 59. Kx modifier applied    PT Start Time 1150    PT Stop Time 1230    PT Time Calculation (min) 40 min    Activity Tolerance Patient tolerated treatment well    Behavior During Therapy [facility] for tasks assessed/performed             Past Medical History:  Diagnosis Date   Cardiac arrest ([place]) 05/22/2016   Cataract    CHF (congestive heart failure) ([name])    Diabetes mellitus without complication ([place])    type 2   Diabetic retinopathy ([place])    Hyperlipidemia    Hypertension    Memory loss    mild   Myocardial infarct ([place]) 2105   Retinopathy    Both eyes   S/P primary angioplasty with coronary stent 05/22/2016   Vitamin B12 deficiency    Vitreous hemorrhage of left eye ([place])    proliferative diabetic retinopathy    Past Surgical History:  Procedure Laterality Date   ANGIOPLASTY     2015   COLONOSCOPY     multiple   EYE SURGERY     PARS PLANA VITRECTOMY Left 03/15/2020   Procedure: PARS PLANA VITRECTOMY WITH 25 GAUGE;  Surgeon: [name], MD;  Location: [place];  Service: Ophthalmology;  Laterality: Left;   PHOTOCOAGULATION WITH LASER Left 03/15/2020   Procedure: PHOTOCOAGULATION WITH LASER; INTRAVITREAL INJECTION OF AVASTIN;  Surgeon: [name], MD;  Location: [place];  Service: Ophthalmology;  Laterality: Left;   REFRACTIVE SURGERY     UPPER GI ENDOSCOPY     several   VITRECTOMY      There were no vitals filed for this visit.   Subjective  Assessment - 05/08/21 1210     Subjective Patient has no complaints today. He reports he felt good after the testing last week.    Patient is accompained by: Family member    Pertinent History CAD; MI 2017; DMII with peripheral neuropathy    How long can you stand comfortably? depends on the day    How long can you walk comfortably? has walked a 1/4 mile but it took 2 hours    Diagnostic tests lumbar x-ray: recent Facet hypertrophy at L4-5 and L5-S1.  Aortic atherosclerosis    Currently in Pain? No/denies                               [facility] Adult PT Treatment/Exercise - 05/09/21 0001       Lumbar Exercises: Aerobic   Stationary Bike perfromed 5 minute on L4    Elliptical tried the elliptical that wasn't as hard to get on 3 min. Treid seated eliptical 3 min    Tread Mill tried but it was too fast for him. Therapy told him before that this was by far his less safe option.      Lumbar Exercises: Machines for Strengthening   Other Lumbar  Machine Exercise bicpes curl 5 lbs 3x10; triceps extension 3x10 5 lbs; shoulder press 5 lbs 3x10; leg press 35lbs 3x10                     PT Education - 05/08/21 1213     Education Details reviewed more gym machines tody. Therapy reviewed safety for tremill. At this time he was advised it is not the safest option for    Person(s) Educated Patient    Methods Explanation;Tactile cues;Demonstration;Verbal cues    Comprehension Verbalized understanding;Returned demonstration;Verbal cues required;Tactile cues required              PT Short Term Goals - 04/08/21 1331       PT SHORT TERM GOAL #1   Title Patient will perfrom all 5 stands in 14 seconds    Baseline Perfromed all 5x goal met and goal updated    Status Revised    Target Date 06/03/21      PT SHORT TERM GOAL #2   Title Patient will increase bilateral LE strength to 4+/5    Baseline goal met and revised    Status Revised    Target Date 06/03/21       PT SHORT TERM GOAL #3   Title Patients will perfrom narrow base of support with SBA    Baseline perfroming on air-ex mat    Time 4    Period Weeks    Status On-going      PT SHORT TERM GOAL #4   Title Patient will increase 6 min walk ttest to 600'    Period Weeks    Status New    Target Date 06/03/21               PT Long Term Goals - 02/08/21 1400       PT LONG TERM GOAL #1   Title Patient will ambualte 1000' on 6 min walk test in order to improve ability to ambualte int he community    Time 8    Period Weeks    Status New    Target Date 04/05/21      PT LONG TERM GOAL #2   Title Patient will demonstrate a 30 on the BERG balance scale    Time 6    Period Weeks    Status New    Target Date 03/22/21      PT LONG TERM GOAL #3   Title Patient wil be independent with a gym program to improve strength and endurance    Time 6    Period Weeks    Status New    Target Date 03/22/21                   Plan - 05/08/21 1216     Clinical Impression Statement Patient had a good tolerance to ther-ex today. Therapy reviewed different aerobic exercises. His HR got up to 90 bpm on the arc elliptical. He did very well on the seated eelliptical. Therapy continues to try him on some different machines. He tolerated all well. He is making good prgoress. We will continue to progress him towards an indepdnent gym program.    Personal Factors and Comorbidities Fitness;Past/Current Experience;Comorbidity 3+;Time since onset of injury/illness/exacerbation    Comorbidities DMII with neuropathy, CAD, left knee pain at times    Examination-Activity Limitations Bed Mobility;Carry;Squat;Sit;Stairs;Lift;Locomotion Level;Other    Stability/Clinical Decision Making Evolving/Moderate complexity    Clinical Decision Making High    Rehab Potential  Good    PT Frequency 2x / week    PT Duration 8 weeks    PT Treatment/Interventions ADLs/Self Care Home Management;Functional mobility  training;Therapeutic activities;Therapeutic exercise;Neuromuscular re-education;Gait training;Stair training;Patient/family education;Manual techniques;Passive range of motion    PT Next Visit Plan continue with balance training; continue to progress strengthein; review UE and LE HEP to increase intensity, Nu-step endurance 5 intervals of 2 minute at level 3 next    PT Home Exercise Plan [facility]    Consulted and Agree with Plan of Care Patient             Patient will benefit from skilled therapeutic intervention in order to improve the following deficits and impairments:  Abnormal gait, Cardiopulmonary status limiting activity, Decreased mobility, Decreased activity tolerance, Decreased endurance, Decreased strength, Difficulty walking, Decreased range of motion, Improper body mechanics, Postural dysfunction  Visit Diagnosis: Other abnormalities of gait and mobility  Difficulty in walking, not elsewhere classified  Muscle weakness (generalized)     Problem List Patient Active Problem List   Diagnosis Date Noted   Adjustment disorder 01/22/2021   Chronic back pain 10/22/2020   Peripheral vertigo 05/15/2020   Dermatitis 01/04/2020   Unintentional weight loss with loose stools 05/19/2019   Low vitamin B12 level 03/29/2019   Heme positive stool 03/14/2019   Normocytic anemia 03/14/2019   Chronic diastolic heart failure ([place]) 02/08/2019   Diabetic retinopathy ([place]) 09/08/2018   Physical debility 05/24/2018   Varicose veins of both lower extremities 03/01/2018   Fatigue due to exertion 10/14/2017   GERD (gastroesophageal reflux disease) 08/25/2017   CAD in native artery 04/14/2017   Hyperlipidemia associated with type 2 diabetes mellitus ([place]) 04/14/2017   Diabetic peripheral neuropathy associated with type 2 diabetes mellitus ([place]) 08/17/2016   T2DM (type 2 diabetes mellitus) ([place]) 05/02/2016   Hypertension associated with diabetes ([place]) 05/02/2016    Carney Living,  PT 05/09/2021, 12:14 PM  [facility] [address] [place], [place], [address] Phone: [phone]   Fax:  [phone]  Name: Jeffrey [name] MRN: [id number] Date of Birth: 06-16-1956

## 2021-05-13 ENCOUNTER — Encounter (HOSPITAL_BASED_OUTPATIENT_CLINIC_OR_DEPARTMENT_OTHER): Payer: Self-pay | Admitting: Physical Therapy

## 2021-05-13 ENCOUNTER — Ambulatory Visit (HOSPITAL_BASED_OUTPATIENT_CLINIC_OR_DEPARTMENT_OTHER): Payer: HMO | Attending: Family Medicine | Admitting: Physical Therapy

## 2021-05-13 ENCOUNTER — Other Ambulatory Visit: Payer: Self-pay

## 2021-05-13 DIAGNOSIS — R2689 Other abnormalities of gait and mobility: Secondary | ICD-10-CM | POA: Insufficient documentation

## 2021-05-13 DIAGNOSIS — M6281 Muscle weakness (generalized): Secondary | ICD-10-CM | POA: Insufficient documentation

## 2021-05-13 DIAGNOSIS — R262 Difficulty in walking, not elsewhere classified: Secondary | ICD-10-CM | POA: Diagnosis not present

## 2021-05-13 NOTE — Therapy (Signed)
[place], [place], [address] Phone: [phone]   Fax:  [phone]  Physical Therapy Treatment  Patient Details  Name: Jeffrey [name] MRN: [id number] Date of Birth: [birth year] Referring Provider (PT): Dimas Chyle MD   Encounter Date: 05/13/2021   PT End of Session - 05/13/21 1221     Visit Number 21    Number of Visits 31    Date for PT Re-Evaluation 06/17/21    Authorization Type HTA 2022 30 dollars progress note perfromed at visit 13. Kx modifier applied    PT Start Time 1148    PT Stop Time 1230    PT Time Calculation (min) 42 min    Activity Tolerance Patient tolerated treatment well    Behavior During Therapy [facility] for tasks assessed/performed             Past Medical History:  Diagnosis Date   Cardiac arrest ([place]) 05/22/2016   Cataract    CHF (congestive heart failure) ([name])    Diabetes mellitus without complication ([place])    type 2   Diabetic retinopathy ([place])    Hyperlipidemia    Hypertension    Memory loss    mild   Myocardial infarct ([place]) 2105   Retinopathy    Both eyes   S/P primary angioplasty with coronary stent 05/22/2016   Vitamin B12 deficiency    Vitreous hemorrhage of left eye ([place])    proliferative diabetic retinopathy    Past Surgical History:  Procedure Laterality Date   ANGIOPLASTY     2015   COLONOSCOPY     multiple   EYE SURGERY     PARS PLANA VITRECTOMY Left 03/15/2020   Procedure: PARS PLANA VITRECTOMY WITH 25 GAUGE;  Surgeon: [name], MD;  Location: [place];  Service: Ophthalmology;  Laterality: Left;   PHOTOCOAGULATION WITH LASER Left 03/15/2020   Procedure: PHOTOCOAGULATION WITH LASER; INTRAVITREAL INJECTION OF AVASTIN;  Surgeon: [name], MD;  Location: [place];  Service: Ophthalmology;  Laterality: Left;   REFRACTIVE SURGERY     UPPER GI ENDOSCOPY     several   VITRECTOMY      There were no vitals filed for this visit.   Subjective  Assessment - 05/13/21 1154     Subjective Patient reports he did well after the last visit, but feels like the leg press may have caused him to have more BM's. He feels like it could also be the medicine.    Pertinent History CAD; MI 2015 ( corrected per patient) ; DMII with peripheral neuropathy    How long can you stand comfortably? depends on the day    How long can you walk comfortably? has walked a 1/4 mile but it took 2 hours    Diagnostic tests lumbar x-ray: recent Facet hypertrophy at L4-5 and L5-S1.  Aortic atherosclerosis    Currently in Pain? No/denies                               [facility] Adult PT Treatment/Exercise - 05/13/21 0001       Knee/Hip Exercises: Aerobic   Elliptical seated nu-step 5 min tested HR 99 bpm; 2 min more with similar HR      Shoulder Exercises: Seated   Extension Limitations 3x10 green    Theraband Level (Shoulder Row) Level 3 (Green)    Row Weight (lbs) 3x10    External Rotation Limitations 3x10 seated with  green    Other Seated Exercises bilateral er green 2eated shoulder flexion 3x10 1lb tried 2 but could not keep his balance; seated Y 3x10 1lb; Biceps curl 3lb 3x10                     PT Education - 05/13/21 1202     Education Details reviewed HR with exercises. Reviewed seated UE exercises    Person(s) Educated Patient    Methods Explanation;Demonstration;Tactile cues;Verbal cues    Comprehension Verbalized understanding;Returned demonstration;Verbal cues required;Tactile cues required              PT Short Term Goals - 04/08/21 1331       PT SHORT TERM GOAL #1   Title Patient will perfrom all 5 stands in 14 seconds    Baseline Perfromed all 5x goal met and goal updated    Status Revised    Target Date 06/03/21      PT SHORT TERM GOAL #2   Title Patient will increase bilateral LE strength to 4+/5    Baseline goal met and revised    Status Revised    Target Date 06/03/21      PT SHORT TERM GOAL  #3   Title Patients will perfrom narrow base of support with SBA    Baseline perfroming on air-ex mat    Time 4    Period Weeks    Status On-going      PT SHORT TERM GOAL #4   Title Patient will increase 6 min walk ttest to 600'    Period Weeks    Status New    Target Date 06/03/21               PT Long Term Goals - 02/08/21 1400       PT LONG TERM GOAL #1   Title Patient will ambualte 1000' on 6 min walk test in order to improve ability to ambualte int he community    Time 8    Period Weeks    Status New    Target Date 04/05/21      PT LONG TERM GOAL #2   Title Patient will demonstrate a 30 on the BERG balance scale    Time 6    Period Weeks    Status New    Target Date 03/22/21      PT LONG TERM GOAL #3   Title Patient wil be independent with a gym program to improve strength and endurance    Time 6    Period Weeks    Status New    Target Date 03/22/21                   Plan - 05/13/21 1223     Clinical Impression Statement Patient's HR got up to 99 bpm on the seated elliptical. He reported feeling veyr good on that piece of equipment. He was encoruaged to try it when he gets a gym membership and to build it up. He was able to use hand weghts , and perfrom exercises with a green band. He was given the green band for home. We will continue to progress as tolerated. He had some difficulty with shoulder flexion with the 2 and 3 pound weight. he was put into a chair instead of sitting on the table to help with posterior balance,    Personal Factors and Comorbidities Fitness;Past/Current Experience;Comorbidity 3+;Time since onset of injury/illness/exacerbation    Comorbidities DMII with neuropathy, CAD,  left knee pain at times    Examination-Activity Limitations Bed Mobility;Carry;Squat;Sit;Stairs;Lift;Locomotion Level;Other    Examination-Participation Restrictions Cleaning;Community Activity;Driving;Laundry;Shop    Stability/Clinical Decision Making  Evolving/Moderate complexity    Clinical Decision Making High    Rehab Potential Good    PT Frequency 2x / week    PT Duration 8 weeks    PT Treatment/Interventions ADLs/Self Care Home Management;Functional mobility training;Therapeutic activities;Therapeutic exercise;Neuromuscular re-education;Gait training;Stair training;Patient/family education;Manual techniques;Passive range of motion    PT Next Visit Plan continue with balance training; continue to progress strengthein; review UE and LE HEP to increase intensity, Nu-step endurance 5 intervals of 2 minute at level 3 next    PT Home Exercise Plan [facility]    Consulted and Agree with Plan of Care Patient             Patient will benefit from skilled therapeutic intervention in order to improve the following deficits and impairments:  Abnormal gait, Cardiopulmonary status limiting activity, Decreased mobility, Decreased activity tolerance, Decreased endurance, Decreased strength, Difficulty walking, Decreased range of motion, Improper body mechanics, Postural dysfunction  Visit Diagnosis: Other abnormalities of gait and mobility  Difficulty in walking, not elsewhere classified  Muscle weakness (generalized)     Problem List Patient Active Problem List   Diagnosis Date Noted   Adjustment disorder 01/22/2021   Chronic back pain 10/22/2020   Peripheral vertigo 05/15/2020   Dermatitis 01/04/2020   Unintentional weight loss with loose stools 05/19/2019   Low vitamin B12 level 03/29/2019   Heme positive stool 03/14/2019   Normocytic anemia 03/14/2019   Chronic diastolic heart failure ([place]) 02/08/2019   Diabetic retinopathy ([place]) 09/08/2018   Physical debility 05/24/2018   Varicose veins of both lower extremities 03/01/2018   Fatigue due to exertion 10/14/2017   GERD (gastroesophageal reflux disease) 08/25/2017   CAD in native artery 04/14/2017   Hyperlipidemia associated with type 2 diabetes mellitus ([place]) 04/14/2017    Diabetic peripheral neuropathy associated with type 2 diabetes mellitus ([place]) 08/17/2016   T2DM (type 2 diabetes mellitus) ([place]) 05/02/2016   Hypertension associated with diabetes ([place]) 05/02/2016    Carney Living, PT DPT  05/13/2021, 12:54 PM  [facility] [address] [place], [place], [address] Phone: [phone]   Fax:  [phone]  Name: Jeffrey [name] MRN: [id number] Date of Birth: 29-Dec-1955

## 2021-05-15 ENCOUNTER — Ambulatory Visit (HOSPITAL_BASED_OUTPATIENT_CLINIC_OR_DEPARTMENT_OTHER): Payer: HMO | Admitting: Physical Therapy

## 2021-05-20 ENCOUNTER — Encounter (HOSPITAL_BASED_OUTPATIENT_CLINIC_OR_DEPARTMENT_OTHER): Payer: Self-pay | Admitting: Physical Therapy

## 2021-05-20 ENCOUNTER — Other Ambulatory Visit: Payer: Self-pay

## 2021-05-20 ENCOUNTER — Ambulatory Visit (HOSPITAL_BASED_OUTPATIENT_CLINIC_OR_DEPARTMENT_OTHER): Payer: HMO | Admitting: Physical Therapy

## 2021-05-20 DIAGNOSIS — R262 Difficulty in walking, not elsewhere classified: Secondary | ICD-10-CM

## 2021-05-20 DIAGNOSIS — M6281 Muscle weakness (generalized): Secondary | ICD-10-CM

## 2021-05-20 DIAGNOSIS — R2689 Other abnormalities of gait and mobility: Secondary | ICD-10-CM

## 2021-05-20 NOTE — Therapy (Signed)
[place], [place], [address] Phone: [phone]   Fax:  [phone]  Physical Therapy Treatment  Patient Details  Name: Jeffrey [name] MRN: [id number] Date of Birth: 1955/10/10 Referring Provider (PT): Dimas Chyle MD   Encounter Date: 05/20/2021   PT End of Session - 05/20/21 1220     Visit Number 22    Number of Visits 31    Date for PT Re-Evaluation 06/17/21    Authorization Type HTA 2022 30 dollars progress note perfromed at visit 2. Kx modifier applied    PT Start Time 1147    PT Stop Time 1227    PT Time Calculation (min) 40 min    Activity Tolerance Patient tolerated treatment well    Behavior During Therapy [facility] for tasks assessed/performed             Past Medical History:  Diagnosis Date   Cardiac arrest ([place]) 05/22/2016   Cataract    CHF (congestive heart failure) ([name])    Diabetes mellitus without complication ([place])    type 2   Diabetic retinopathy ([place])    Hyperlipidemia    Hypertension    Memory loss    mild   Myocardial infarct ([place]) 2105   Retinopathy    Both eyes   S/P primary angioplasty with coronary stent 05/22/2016   Vitamin B12 deficiency    Vitreous hemorrhage of left eye ([place])    proliferative diabetic retinopathy    Past Surgical History:  Procedure Laterality Date   ANGIOPLASTY     2015   COLONOSCOPY     multiple   EYE SURGERY     PARS PLANA VITRECTOMY Left 03/15/2020   Procedure: PARS PLANA VITRECTOMY WITH 25 GAUGE;  Surgeon: [name], MD;  Location: [place];  Service: Ophthalmology;  Laterality: Left;   PHOTOCOAGULATION WITH LASER Left 03/15/2020   Procedure: PHOTOCOAGULATION WITH LASER; INTRAVITREAL INJECTION OF AVASTIN;  Surgeon: [name], MD;  Location: [place];  Service: Ophthalmology;  Laterality: Left;   REFRACTIVE SURGERY     UPPER GI ENDOSCOPY     several   VITRECTOMY      There were no vitals filed for this visit.   Subjective  Assessment - 05/20/21 1150     Subjective Patient was unable to come to heis second appointment last weke. He feels like he is a little more tired today but overall he is doing well.    Pertinent History CAD; MI 2015 ( corrected per patient) ; DMII with peripheral neuropathy    How long can you stand comfortably? depends on the day    How long can you walk comfortably? has walked a 1/4 mile but it took 2 hours    Diagnostic tests lumbar x-ray: recent Facet hypertrophy at L4-5 and L5-S1.  Aortic atherosclerosis    Currently in Pain? No/denies                               [facility] Adult PT Treatment/Exercise - 05/20/21 0001       Lumbar Exercises: Aerobic   Elliptical step on elliptical 2x 1:30 intervals    Other Aerobic Exercise seated elliptical x20      Lumbar Exercises: Machines for Strengthening   Leg Press 3x10 50 lbs    Other Lumbar Machine Exercise shoulder press 3x10 10 lbs; cable roe and extension 10lbs 3x10  PT Education - 05/20/21 1219     Education Details reviewed set up of gym eequipment    Person(s) Educated Patient    Methods Explanation;Demonstration;Tactile cues;Verbal cues    Comprehension Verbalized understanding;Returned demonstration;Verbal cues required;Tactile cues required              PT Short Term Goals - 04/08/21 1331       PT SHORT TERM GOAL #1   Title Patient will perfrom all 5 stands in 14 seconds    Baseline Perfromed all 5x goal met and goal updated    Status Revised    Target Date 06/03/21      PT SHORT TERM GOAL #2   Title Patient will increase bilateral LE strength to 4+/5    Baseline goal met and revised    Status Revised    Target Date 06/03/21      PT SHORT TERM GOAL #3   Title Patients will perfrom narrow base of support with SBA    Baseline perfroming on air-ex mat    Time 4    Period Weeks    Status On-going      PT SHORT TERM GOAL #4   Title Patient will increase 6 min  walk ttest to 600'    Period Weeks    Status New    Target Date 06/03/21               PT Long Term Goals - 02/08/21 1400       PT LONG TERM GOAL #1   Title Patient will ambualte 1000' on 6 min walk test in order to improve ability to ambualte int he community    Time 8    Period Weeks    Status New    Target Date 04/05/21      PT LONG TERM GOAL #2   Title Patient will demonstrate a 30 on the BERG balance scale    Time 6    Period Weeks    Status New    Target Date 03/22/21      PT LONG TERM GOAL #3   Title Patient wil be independent with a gym program to improve strength and endurance    Time 6    Period Weeks    Status New    Target Date 03/22/21                   Plan - 05/20/21 1220     Clinical Impression Statement Patient reported a little more fatigue today but he is doing much harder activity in the gym. He tolerated seated and standing elliptical well. Therapy will continue to progress towards independnet exercise program as tolerated.    Personal Factors and Comorbidities Fitness;Past/Current Experience;Comorbidity 3+;Time since onset of injury/illness/exacerbation    Comorbidities DMII with neuropathy, CAD, left knee pain at times    Examination-Activity Limitations Bed Mobility;Carry;Squat;Sit;Stairs;Lift;Locomotion Level;Other    Examination-Participation Restrictions Cleaning;Community Activity;Driving;Laundry;Shop    Stability/Clinical Decision Making Evolving/Moderate complexity    Clinical Decision Making High    Rehab Potential Good    PT Frequency 2x / week    PT Duration 8 weeks    PT Treatment/Interventions ADLs/Self Care Home Management;Functional mobility training;Therapeutic activities;Therapeutic exercise;Neuromuscular re-education;Gait training;Stair training;Patient/family education;Manual techniques;Passive range of motion    PT Next Visit Plan continue with balance training; continue to progress strengthein; review UE and LE HEP  to increase intensity, Nu-step endurance 5 intervals of 2 minute at level 3 next    PT Home  Exercise Plan [facility]    Consulted and Agree with Plan of Care Patient             Patient will benefit from skilled therapeutic intervention in order to improve the following deficits and impairments:  Abnormal gait, Cardiopulmonary status limiting activity, Decreased mobility, Decreased activity tolerance, Decreased endurance, Decreased strength, Difficulty walking, Decreased range of motion, Improper body mechanics, Postural dysfunction  Visit Diagnosis: Other abnormalities of gait and mobility  Difficulty in walking, not elsewhere classified  Muscle weakness (generalized)     Problem List Patient Active Problem List   Diagnosis Date Noted   Adjustment disorder 01/22/2021   Chronic back pain 10/22/2020   Peripheral vertigo 05/15/2020   Dermatitis 01/04/2020   Unintentional weight loss with loose stools 05/19/2019   Low vitamin B12 level 03/29/2019   Heme positive stool 03/14/2019   Normocytic anemia 03/14/2019   Chronic diastolic heart failure ([place]) 02/08/2019   Diabetic retinopathy ([place]) 09/08/2018   Physical debility 05/24/2018   Varicose veins of both lower extremities 03/01/2018   Fatigue due to exertion 10/14/2017   GERD (gastroesophageal reflux disease) 08/25/2017   CAD in native artery 04/14/2017   Hyperlipidemia associated with type 2 diabetes mellitus ([place]) 04/14/2017   Diabetic peripheral neuropathy associated with type 2 diabetes mellitus ([place]) 08/17/2016   T2DM (type 2 diabetes mellitus) ([place]) 05/02/2016   Hypertension associated with diabetes ([place]) 05/02/2016    Carney Living, PT 05/20/2021, 1:15 PM  [facility] [address] [place], [place], [address] Phone: [phone]   Fax:  [phone]  Name: Jeffrey [name] MRN: [id number] Date of Birth: 1955/12/15

## 2021-05-22 ENCOUNTER — Ambulatory Visit (HOSPITAL_BASED_OUTPATIENT_CLINIC_OR_DEPARTMENT_OTHER): Payer: HMO | Admitting: Physical Therapy

## 2021-05-22 ENCOUNTER — Encounter (HOSPITAL_BASED_OUTPATIENT_CLINIC_OR_DEPARTMENT_OTHER): Payer: Self-pay | Admitting: Physical Therapy

## 2021-05-22 ENCOUNTER — Other Ambulatory Visit: Payer: Self-pay

## 2021-05-22 DIAGNOSIS — M6281 Muscle weakness (generalized): Secondary | ICD-10-CM

## 2021-05-22 DIAGNOSIS — R2689 Other abnormalities of gait and mobility: Secondary | ICD-10-CM

## 2021-05-22 DIAGNOSIS — R262 Difficulty in walking, not elsewhere classified: Secondary | ICD-10-CM

## 2021-05-23 ENCOUNTER — Encounter (HOSPITAL_BASED_OUTPATIENT_CLINIC_OR_DEPARTMENT_OTHER): Payer: Self-pay | Admitting: Physical Therapy

## 2021-05-23 NOTE — Therapy (Signed)
[place], [place], [address] Phone: [phone]   Fax:  [phone]  Physical Therapy Treatment  Patient Details  Name: Jeffrey [name] MRN: [id number] Date of Birth: 1955/08/25 Referring Provider (PT): Dimas Chyle MD   Encounter Date: 05/22/2021   PT End of Session - 05/22/21 1151     Visit Number 23    Number of Visits 31    Date for PT Re-Evaluation 06/17/21    Authorization Type HTA 2022 30 dollars progress note perfromed at visit 66. Kx modifier applied    PT Start Time 0388    PT Stop Time 1228    PT Time Calculation (min) 43 min    Activity Tolerance Patient tolerated treatment well    Behavior During Therapy [facility] for tasks assessed/performed             Past Medical History:  Diagnosis Date   Cardiac arrest ([place]) 05/22/2016   Cataract    CHF (congestive heart failure) ([name])    Diabetes mellitus without complication ([place])    type 2   Diabetic retinopathy ([place])    Hyperlipidemia    Hypertension    Memory loss    mild   Myocardial infarct ([place]) 2105   Retinopathy    Both eyes   S/P primary angioplasty with coronary stent 05/22/2016   Vitamin B12 deficiency    Vitreous hemorrhage of left eye ([place])    proliferative diabetic retinopathy    Past Surgical History:  Procedure Laterality Date   ANGIOPLASTY     2015   COLONOSCOPY     multiple   EYE SURGERY     PARS PLANA VITRECTOMY Left 03/15/2020   Procedure: PARS PLANA VITRECTOMY WITH 25 GAUGE;  Surgeon: [name], MD;  Location: [place];  Service: Ophthalmology;  Laterality: Left;   PHOTOCOAGULATION WITH LASER Left 03/15/2020   Procedure: PHOTOCOAGULATION WITH LASER; INTRAVITREAL INJECTION OF AVASTIN;  Surgeon: [name], MD;  Location: [place];  Service: Ophthalmology;  Laterality: Left;   REFRACTIVE SURGERY     UPPER GI ENDOSCOPY     several   VITRECTOMY      There were no vitals filed for this visit.   Subjective  Assessment - 05/22/21 1150     Subjective Pateitn repoerts he was a little sore after the last visit but verall he did well.    Pertinent History CAD; MI 2015 ( corrected per patient) ; DMII with peripheral neuropathy    How long can you stand comfortably? depends on the day    How long can you walk comfortably? has walked a 1/4 mile but it took 2 hours    Diagnostic tests lumbar x-ray: recent Facet hypertrophy at L4-5 and L5-S1.  Aortic atherosclerosis    Currently in Pain? No/denies                               [facility] Adult PT Treatment/Exercise - 05/23/21 0001       Lumbar Exercises: Aerobic   Nustep 6 min L4 HR remained consitent      Lumbar Exercises: Machines for Strengthening   Leg Press 3x10 50 lbs    Other Lumbar Machine Exercise shoulder press 3x10 10 lbs; row machine 3x10 25lbs; hip abduction machine 3x10;                     PT Education - 05/22/21 1150  Education Details reviewed set up of machines    Person(s) Educated Patient    Methods Explanation;Tactile cues;Demonstration;Verbal cues;Handout    Comprehension Verbalized understanding;Returned demonstration;Verbal cues required;Tactile cues required              PT Short Term Goals - 04/08/21 1331       PT SHORT TERM GOAL #1   Title Patient will perfrom all 5 stands in 14 seconds    Baseline Perfromed all 5x goal met and goal updated    Status Revised    Target Date 06/03/21      PT SHORT TERM GOAL #2   Title Patient will increase bilateral LE strength to 4+/5    Baseline goal met and revised    Status Revised    Target Date 06/03/21      PT SHORT TERM GOAL #3   Title Patients will perfrom narrow base of support with SBA    Baseline perfroming on air-ex mat    Time 4    Period Weeks    Status On-going      PT SHORT TERM GOAL #4   Title Patient will increase 6 min walk ttest to 600'    Period Weeks    Status New    Target Date 06/03/21                PT Long Term Goals - 05/23/21 1439       PT LONG TERM GOAL #1   Title Patient will ambualte 1000' on 6 min walk test in order to improve ability to ambualte int he community    Time 8    Period Weeks    Status On-going      PT LONG TERM GOAL #2   Title Patient will demonstrate a 30 on the BERG balance scale    Time 6    Period Weeks    Status On-going      PT LONG TERM GOAL #3   Title Patient wil be independent with a gym program to improve strength and endurance    Baseline wokring on gym program    Time 6    Period Weeks    Status Achieved      PT LONG TERM GOAL #4   Title Pt to demo improved LE strength to 5/5 to improve stability and ability for gait.    Time 6    Period Weeks    Status Achieved                   Plan - 05/23/21 1438     Clinical Impression Statement Patient tolerated treatment well. He had no significant pain. He reported some fatigue. He used his walker today. he did better with the walker. he likes the machines he has been using. Therapy add hip adduction today.    Personal Factors and Comorbidities Fitness;Past/Current Experience;Comorbidity 3+;Time since onset of injury/illness/exacerbation    Comorbidities DMII with neuropathy, CAD, left knee pain at times    Examination-Activity Limitations Bed Mobility;Carry;Squat;Sit;Stairs;Lift;Locomotion Level;Other    Examination-Participation Restrictions Cleaning;Community Activity;Driving;Laundry;Shop    Stability/Clinical Decision Making Evolving/Moderate complexity    Clinical Decision Making High    Rehab Potential Good    PT Frequency 2x / week    PT Duration 8 weeks    PT Treatment/Interventions ADLs/Self Care Home Management;Functional mobility training;Therapeutic activities;Therapeutic exercise;Neuromuscular re-education;Gait training;Stair training;Patient/family education;Manual techniques;Passive range of motion    PT Next Visit Plan continue with balance training; continue to  progress  strengthein; review UE and LE HEP to increase intensity, Nu-step endurance 5 intervals of 2 minute at level 3 next    PT Home Exercise Plan [facility]    Consulted and Agree with Plan of Care Patient             Patient will benefit from skilled therapeutic intervention in order to improve the following deficits and impairments:  Abnormal gait, Cardiopulmonary status limiting activity, Decreased mobility, Decreased activity tolerance, Decreased endurance, Decreased strength, Difficulty walking, Decreased range of motion, Improper body mechanics, Postural dysfunction  Visit Diagnosis: Other abnormalities of gait and mobility  Difficulty in walking, not elsewhere classified  Muscle weakness (generalized)     Problem List Patient Active Problem List   Diagnosis Date Noted   Adjustment disorder 01/22/2021   Chronic back pain 10/22/2020   Peripheral vertigo 05/15/2020   Dermatitis 01/04/2020   Unintentional weight loss with loose stools 05/19/2019   Low vitamin B12 level 03/29/2019   Heme positive stool 03/14/2019   Normocytic anemia 03/14/2019   Chronic diastolic heart failure ([place]) 02/08/2019   Diabetic retinopathy ([place]) 09/08/2018   Physical debility 05/24/2018   Varicose veins of both lower extremities 03/01/2018   Fatigue due to exertion 10/14/2017   GERD (gastroesophageal reflux disease) 08/25/2017   CAD in native artery 04/14/2017   Hyperlipidemia associated with type 2 diabetes mellitus ([place]) 04/14/2017   Diabetic peripheral neuropathy associated with type 2 diabetes mellitus ([place]) 08/17/2016   T2DM (type 2 diabetes mellitus) ([place]) 05/02/2016   Hypertension associated with diabetes ([place]) 05/02/2016    Carney Living, PT 05/23/2021, 2:44 PM  [facility] [address] [place] [address] [place], [place], [address] Phone: [phone]   Fax:  [phone]  Name: Jeffrey [name] MRN: [id number] Date of Birth:  13-May-1956

## 2021-05-27 ENCOUNTER — Ambulatory Visit (HOSPITAL_BASED_OUTPATIENT_CLINIC_OR_DEPARTMENT_OTHER): Payer: HMO | Admitting: Physical Therapy

## 2021-05-27 ENCOUNTER — Other Ambulatory Visit: Payer: Self-pay

## 2021-05-27 ENCOUNTER — Encounter (HOSPITAL_BASED_OUTPATIENT_CLINIC_OR_DEPARTMENT_OTHER): Payer: Self-pay | Admitting: Physical Therapy

## 2021-05-27 DIAGNOSIS — R2689 Other abnormalities of gait and mobility: Secondary | ICD-10-CM | POA: Diagnosis not present

## 2021-05-27 DIAGNOSIS — R262 Difficulty in walking, not elsewhere classified: Secondary | ICD-10-CM

## 2021-05-27 DIAGNOSIS — M6281 Muscle weakness (generalized): Secondary | ICD-10-CM

## 2021-05-27 NOTE — Therapy (Signed)
[place], [place], [address] Phone: [phone]   Fax:  [phone]  Physical Therapy Treatment  Patient Details  Name: Jeffrey [name] MRN: [id number] Date of Birth: 12/05/1955 Referring Provider (PT): Dimas Chyle MD   Encounter Date: 05/27/2021   PT End of Session - 05/27/21 1207     Visit Number 24    Number of Visits 31    Date for PT Re-Evaluation 06/17/21    Authorization Type HTA 2022 30 dollars progress note perfromed at visit 67. Kx modifier applied    PT Start Time 1150    PT Stop Time 6270    PT Time Calculation (min) 45 min    Activity Tolerance Patient tolerated treatment well    Behavior During Therapy [facility] for tasks assessed/performed             Past Medical History:  Diagnosis Date   Cardiac arrest ([place]) 05/22/2016   Cataract    CHF (congestive heart failure) ([name])    Diabetes mellitus without complication ([place])    type 2   Diabetic retinopathy ([place])    Hyperlipidemia    Hypertension    Memory loss    mild   Myocardial infarct ([place]) 2105   Retinopathy    Both eyes   S/P primary angioplasty with coronary stent 05/22/2016   Vitamin B12 deficiency    Vitreous hemorrhage of left eye ([place])    proliferative diabetic retinopathy    Past Surgical History:  Procedure Laterality Date   ANGIOPLASTY     2015   COLONOSCOPY     multiple   EYE SURGERY     PARS PLANA VITRECTOMY Left 03/15/2020   Procedure: PARS PLANA VITRECTOMY WITH 25 GAUGE;  Surgeon: [name], MD;  Location: [place];  Service: Ophthalmology;  Laterality: Left;   PHOTOCOAGULATION WITH LASER Left 03/15/2020   Procedure: PHOTOCOAGULATION WITH LASER; INTRAVITREAL INJECTION OF AVASTIN;  Surgeon: [name], MD;  Location: [place];  Service: Ophthalmology;  Laterality: Left;   REFRACTIVE SURGERY     UPPER GI ENDOSCOPY     several   VITRECTOMY      There were no vitals filed for this visit.   Subjective  Assessment - 05/27/21 1200     Subjective Patient reports he is a little tired today. He took a Psychiatrist. He does not feel there is any specifc reason for his faitgue.    Patient is accompained by: Family member    Pertinent History CAD; MI 2015 ( corrected per patient) ; DMII with peripheral neuropathy    How long can you stand comfortably? depends on the day    How long can you walk comfortably? has walked a 1/4 mile but it took 2 hours    Diagnostic tests lumbar x-ray: recent Facet hypertrophy at L4-5 and L5-S1.  Aortic atherosclerosis    Currently in Pain? No/denies                               [facility] Adult PT Treatment/Exercise - 05/27/21 0001       Lumbar Exercises: Machines for Strengthening   Cybex Lumbar Extension 3x10 10 lbs    Leg Press 3x10 50 lbs    Other Lumbar Machine Exercise shoulder press 3x10 10 lbs; row machine 3x10 25lbs; hip abduction machine 3x10;           Chest press 3x10 20lbs  PT Education - 05/27/21 1201     Education Details reviewed weights and normal weight progression    Person(s) Educated Patient    Methods Explanation;Demonstration;Tactile cues;Verbal cues    Comprehension Verbalized understanding;Returned demonstration;Verbal cues required;Tactile cues required              PT Short Term Goals - 04/08/21 1331       PT SHORT TERM GOAL #1   Title Patient will perfrom all 5 stands in 14 seconds    Baseline Perfromed all 5x goal met and goal updated    Status Revised    Target Date 06/03/21      PT SHORT TERM GOAL #2   Title Patient will increase bilateral LE strength to 4+/5    Baseline goal met and revised    Status Revised    Target Date 06/03/21      PT SHORT TERM GOAL #3   Title Patients will perfrom narrow base of support with SBA    Baseline perfroming on air-ex mat    Time 4    Period Weeks    Status On-going      PT SHORT TERM GOAL #4   Title Patient will increase 6 min walk  ttest to 600'    Period Weeks    Status New    Target Date 06/03/21               PT Long Term Goals - 05/23/21 1439       PT LONG TERM GOAL #1   Title Patient will ambualte 1000' on 6 min walk test in order to improve ability to ambualte int he community    Time 8    Period Weeks    Status On-going      PT LONG TERM GOAL #2   Title Patient will demonstrate a 30 on the BERG balance scale    Time 6    Period Weeks    Status On-going      PT LONG TERM GOAL #3   Title Patient wil be independent with a gym program to improve strength and endurance    Baseline wokring on gym program    Time 6    Period Weeks    Status Achieved      PT LONG TERM GOAL #4   Title Pt to demo improved LE strength to 5/5 to improve stability and ability for gait.    Time 6    Period Weeks    Status Achieved                   Plan - 05/27/21 1208     Clinical Impression Statement Patient contnues to tolerate treatment well. He was fatigued at baseline but was able to complete all exerises. he is becoming more confortable with his gym program. Therapy spoke with his brother. We will have him present over the next few visits to help the patient with set up of machines.    Personal Factors and Comorbidities Fitness;Past/Current Experience;Comorbidity 3+;Time since onset of injury/illness/exacerbation    Comorbidities DMII with neuropathy, CAD, left knee pain at times    Examination-Activity Limitations Bed Mobility;Carry;Squat;Sit;Stairs;Lift;Locomotion Level;Other    Examination-Participation Restrictions Cleaning;Community Activity;Driving;Laundry;Shop    Stability/Clinical Decision Making Evolving/Moderate complexity    Clinical Decision Making High    Rehab Potential Good    PT Frequency 2x / week    PT Duration 8 weeks    PT Treatment/Interventions ADLs/Self Care Home Management;Functional mobility training;Therapeutic activities;Therapeutic  exercise;Neuromuscular  re-education;Gait training;Stair training;Patient/family education;Manual techniques;Passive range of motion    PT Next Visit Plan continue with balance training; continue to progress strengthein; review UE and LE HEP to increase intensity, Nu-step endurance 5 intervals of 2 minute at level 3 next    PT Home Exercise Plan [facility]    Consulted and Agree with Plan of Care Patient             Patient will benefit from skilled therapeutic intervention in order to improve the following deficits and impairments:  Abnormal gait, Cardiopulmonary status limiting activity, Decreased mobility, Decreased activity tolerance, Decreased endurance, Decreased strength, Difficulty walking, Decreased range of motion, Improper body mechanics, Postural dysfunction  Visit Diagnosis: Other abnormalities of gait and mobility  Difficulty in walking, not elsewhere classified  Muscle weakness (generalized)     Problem List Patient Active Problem List   Diagnosis Date Noted   Adjustment disorder 01/22/2021   Chronic back pain 10/22/2020   Peripheral vertigo 05/15/2020   Dermatitis 01/04/2020   Unintentional weight loss with loose stools 05/19/2019   Low vitamin B12 level 03/29/2019   Heme positive stool 03/14/2019   Normocytic anemia 03/14/2019   Chronic diastolic heart failure ([place]) 02/08/2019   Diabetic retinopathy ([place]) 09/08/2018   Physical debility 05/24/2018   Varicose veins of both lower extremities 03/01/2018   Fatigue due to exertion 10/14/2017   GERD (gastroesophageal reflux disease) 08/25/2017   CAD in native artery 04/14/2017   Hyperlipidemia associated with type 2 diabetes mellitus ([place]) 04/14/2017   Diabetic peripheral neuropathy associated with type 2 diabetes mellitus ([place]) 08/17/2016   T2DM (type 2 diabetes mellitus) ([place]) 05/02/2016   Hypertension associated with diabetes ([place]) 05/02/2016    Carney Living, PT 05/27/2021, 1:23 PM  [facility] [address] [place], [place], [address] Phone: [phone]   Fax:  [phone]  Name: Jeffrey [name] MRN: [id number] Date of Birth: 1955/08/12

## 2021-05-28 ENCOUNTER — Encounter (HOSPITAL_BASED_OUTPATIENT_CLINIC_OR_DEPARTMENT_OTHER): Payer: Self-pay | Admitting: Cardiovascular Disease

## 2021-05-28 ENCOUNTER — Encounter (HOSPITAL_BASED_OUTPATIENT_CLINIC_OR_DEPARTMENT_OTHER): Payer: Self-pay

## 2021-05-28 ENCOUNTER — Ambulatory Visit (INDEPENDENT_AMBULATORY_CARE_PROVIDER_SITE_OTHER): Payer: HMO | Admitting: Cardiovascular Disease

## 2021-05-28 VITALS — BP 106/50 | HR 71 | Ht 68.0 in | Wt 160.3 lb

## 2021-05-28 DIAGNOSIS — I152 Hypertension secondary to endocrine disorders: Secondary | ICD-10-CM | POA: Diagnosis not present

## 2021-05-28 DIAGNOSIS — E1159 Type 2 diabetes mellitus with other circulatory complications: Secondary | ICD-10-CM

## 2021-05-28 DIAGNOSIS — Z5181 Encounter for therapeutic drug level monitoring: Secondary | ICD-10-CM

## 2021-05-28 DIAGNOSIS — I2510 Atherosclerotic heart disease of native coronary artery without angina pectoris: Secondary | ICD-10-CM

## 2021-05-28 DIAGNOSIS — I5032 Chronic diastolic (congestive) heart failure: Secondary | ICD-10-CM

## 2021-05-28 DIAGNOSIS — N1832 Chronic kidney disease, stage 3b: Secondary | ICD-10-CM

## 2021-05-28 DIAGNOSIS — N184 Chronic kidney disease, stage 4 (severe): Secondary | ICD-10-CM | POA: Insufficient documentation

## 2021-05-28 DIAGNOSIS — N183 Chronic kidney disease, stage 3 unspecified: Secondary | ICD-10-CM

## 2021-05-28 HISTORY — DX: Chronic kidney disease, stage 4 (severe): N18.4

## 2021-05-28 HISTORY — DX: Chronic kidney disease, stage 3 unspecified: N18.30

## 2021-05-28 MED ORDER — LOSARTAN POTASSIUM 25 MG PO TABS: 25.0000 mg | ORAL_TABLET | Freq: Every day | ORAL | 3 refills | Status: DC

## 2021-05-28 NOTE — Assessment & Plan Note (Addendum)
Mr. Karen has CKD 3.  His creatinine has ranged from 1.5-2.  We will check a basic metabolic panel today.  Stop spironolactone and start losartan 25 mg daily instead.  We will need to repeat a comprehensive metabolic panel in a week.  We will work to try and help him get on an SGLT2 inhibitor.  We discussed a referral to nephrology but he declines because he feels he is already going to too many doctors.

## 2021-05-28 NOTE — Assessment & Plan Note (Signed)
He is euvolemic and doing well.  We will switch spironolactone to losartan.  Ideally he would be on Entresto, however given his low blood pressure and cost concerns this might not be possible.  We will try to get him on an SGLT2 inhibitor.  He is very concerned that the cost will be too high.  He is going to check his insurance first to see if they will cover either.  If not, he likely would be a candidate for patient assistance program.
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
## 2021-05-28 NOTE — Patient Instructions (Signed)
Medication Instructions:  STOP SPIRONOLACTONE   START LOSARTAN 25 MG DAILY   *If you need a refill on your cardiac medications before your next appointment, please call your pharmacy*  Lab Work: [facility]/CMET IN 1 WEEK   If you have labs (blood work) drawn today and your tests are completely normal, you will receive your results only by: [place] (if you have MyChart) OR A paper copy in the mail If you have any lab test that is abnormal or we need to change your treatment, we will call you to review the results.  Testing/Procedures: NONE   Follow-Up: At [facility], you and your health needs are our priority.  As part of our continuing mission to provide you with exceptional heart care, we have created designated Provider Care Teams.  These Care Teams include your primary Cardiologist (physician) and Advanced Practice Providers (APPs -  Physician Assistants and Nurse Practitioners) who all work together to provide you with the care you need, when you need it.  We recommend signing up for the patient portal called "MyChart".  Sign up information is provided on this After Visit Summary.  MyChart is used to connect with patients for Virtual Visits (Telemedicine).  Patients are able to view lab/test results, encounter notes, upcoming appointments, etc.  Non-urgent messages can be sent to your provider as well.   To learn more about what you can do with MyChart, go to [email or web address].    Your next appointment:   6 month(s)  The format for your next appointment:   In Person  Provider:   Skeet Latch, MD  Other Instructions  Your blood pressure goal is 110-130/55-80  Please look to see if your insurance will cover [name] or Jardiance for diabetes, heart disease and chronic kidney disease.

## 2021-05-28 NOTE — Assessment & Plan Note (Signed)
Prior LAD infarct.  He is exercising regularly with PT and doing well.  He has no angina.  Continue aspirin, atorvastatin, and carvedilol.  Check lipids and a CMP in a week.

## 2021-05-28 NOTE — Assessment & Plan Note (Signed)
BP is well-controlled but running to low.  Given his diabetes and CKD, we will stop spironolactone and start losartan 25mg .  Check a BMP today.

## 2021-05-29 ENCOUNTER — Other Ambulatory Visit: Payer: Self-pay

## 2021-05-29 ENCOUNTER — Ambulatory Visit (HOSPITAL_BASED_OUTPATIENT_CLINIC_OR_DEPARTMENT_OTHER): Payer: HMO | Admitting: Physical Therapy

## 2021-05-29 ENCOUNTER — Encounter (HOSPITAL_BASED_OUTPATIENT_CLINIC_OR_DEPARTMENT_OTHER): Payer: Self-pay | Admitting: Physical Therapy

## 2021-05-29 DIAGNOSIS — M6281 Muscle weakness (generalized): Secondary | ICD-10-CM

## 2021-05-29 DIAGNOSIS — R262 Difficulty in walking, not elsewhere classified: Secondary | ICD-10-CM

## 2021-05-29 DIAGNOSIS — R2689 Other abnormalities of gait and mobility: Secondary | ICD-10-CM | POA: Diagnosis not present

## 2021-05-29 LAB — BASIC METABOLIC PANEL
BUN/Creatinine Ratio: 24 (ref 10–24)
BUN: 48 mg/dL — ABNORMAL HIGH (ref 8–27)
CO2: 19 mmol/L — ABNORMAL LOW (ref 20–29)
Calcium: 9.7 mg/dL (ref 8.6–10.2)
Chloride: 103 mmol/L (ref 96–106)
Creatinine, Ser: 2 mg/dL — ABNORMAL HIGH (ref 0.76–1.27)
Glucose: 168 mg/dL — ABNORMAL HIGH (ref 70–99)
Potassium: 4.6 mmol/L (ref 3.5–5.2)
Sodium: 141 mmol/L (ref 134–144)
eGFR: 36 mL/min/{1.73_m2} — ABNORMAL LOW (ref >59)

## 2021-05-30 ENCOUNTER — Encounter (HOSPITAL_BASED_OUTPATIENT_CLINIC_OR_DEPARTMENT_OTHER): Payer: Self-pay | Admitting: Physical Therapy

## 2021-05-30 NOTE — Therapy (Signed)
[place], [place], [address] Phone: [phone]   Fax:  [phone]  Physical Therapy Treatment  Patient Details  Name: Jeffrey [name] MRN: [id number] Date of Birth: [birth year] Referring Provider (PT): Dimas Chyle MD   Encounter Date: 05/29/2021   PT End of Session - 05/30/21 1048     Visit Number 25    Number of Visits 31    Date for PT Re-Evaluation 06/17/21    Authorization Type HTA 2022 30 dollars progress note perfromed at visit 11. Kx modifier applied    PT Start Time 1937    PT Stop Time 1210    PT Time Calculation (min) 25 min    Activity Tolerance Patient tolerated treatment well    Behavior During Therapy [facility] for tasks assessed/performed             Past Medical History:  Diagnosis Date   Cardiac arrest ([place]) 05/22/2016   Cataract    CHF (congestive heart failure) ([name])    CKD (chronic kidney disease) stage 3, GFR 30-59 ml/min ([name]) 05/28/2021   Diabetes mellitus without complication ([place])    type 2   Diabetic retinopathy ([place])    Hyperlipidemia    Hypertension    Memory loss    mild   Myocardial infarct ([place]) 2105   Retinopathy    Both eyes   S/P primary angioplasty with coronary stent 05/22/2016   Vitamin B12 deficiency    Vitreous hemorrhage of left eye ([place])    proliferative diabetic retinopathy    Past Surgical History:  Procedure Laterality Date   ANGIOPLASTY     2015   COLONOSCOPY     multiple   EYE SURGERY     PARS PLANA VITRECTOMY Left 03/15/2020   Procedure: PARS PLANA VITRECTOMY WITH 25 GAUGE;  Surgeon: [name], MD;  Location: [facility];  Service: Ophthalmology;  Laterality: Left;   PHOTOCOAGULATION WITH LASER Left 03/15/2020   Procedure: PHOTOCOAGULATION WITH LASER; INTRAVITREAL INJECTION OF AVASTIN;  Surgeon: [name], MD;  Location: [facility];  Service: Ophthalmology;  Laterality: Left;   REFRACTIVE SURGERY     UPPER GI ENDOSCOPY     several    VITRECTOMY      There were no vitals filed for this visit.   Subjective Assessment - 05/29/21 1153     Subjective Patient is a little tired today. he had a little more fatigue in the shower. He did not feel much fatigue after the last visit.    Pertinent History CAD; MI 2015 ( corrected per patient) ; DMII with peripheral neuropathy    How long can you stand comfortably? depends on the day    How long can you walk comfortably? has walked a 1/4 mile but it took 2 hours    Diagnostic tests lumbar x-ray: recent Facet hypertrophy at L4-5 and L5-S1.  Aortic atherosclerosis    Currently in Pain? No/denies                               [facility] Adult PT Treatment/Exercise - 05/30/21 0001       Lumbar Exercises: Machines for Strengthening   Cybex Lumbar Extension 3x10 10 lbs    Leg Press 3x10 50 lbs    Other Lumbar Machine Exercise standing row 2x10; shouder extension 2x10 10 lbs  PT Education - 05/30/21 1047     Education Details reviewed set up of machines with atient and his brother    Person(s) Educated Patient    Methods Explanation;Demonstration;Tactile cues;Verbal cues    Comprehension Verbalized understanding;Returned demonstration;Tactile cues required;Verbal cues required              PT Short Term Goals - 04/08/21 1331       PT SHORT TERM GOAL #1   Title Patient will perfrom all 5 stands in 14 seconds    Baseline Perfromed all 5x goal met and goal updated    Status Revised    Target Date 06/03/21      PT SHORT TERM GOAL #2   Title Patient will increase bilateral LE strength to 4+/5    Baseline goal met and revised    Status Revised    Target Date 06/03/21      PT SHORT TERM GOAL #3   Title Patients will perfrom narrow base of support with SBA    Baseline perfroming on air-ex mat    Time 4    Period Weeks    Status On-going      PT SHORT TERM GOAL #4   Title Patient will increase 6 min walk ttest to 600'     Period Weeks    Status New    Target Date 06/03/21               PT Long Term Goals - 05/23/21 1439       PT LONG TERM GOAL #1   Title Patient will ambualte 1000' on 6 min walk test in order to improve ability to ambualte int he community    Time 8    Period Weeks    Status On-going      PT LONG TERM GOAL #2   Title Patient will demonstrate a 30 on the BERG balance scale    Time 6    Period Weeks    Status On-going      PT LONG TERM GOAL #3   Title Patient wil be independent with a gym program to improve strength and endurance    Baseline wokring on gym program    Time 6    Period Weeks    Status Achieved      PT LONG TERM GOAL #4   Title Pt to demo improved LE strength to 5/5 to improve stability and ability for gait.    Time 6    Period Weeks    Status Achieved                   Plan - 05/30/21 1052     Clinical Impression Statement Patients treatment was limited today by a fire alarm. He was doing really well with treatment wehen the alram went off. He only had about 25 minutes of workout before he had to go to the parking lot. We were unable to continue as the fire alarm lasted too long. We will continue to review set up and getting on/off machines with his brother next visit.    Personal Factors and Comorbidities Fitness;Past/Current Experience;Comorbidity 3+;Time since onset of injury/illness/exacerbation    Comorbidities DMII with neuropathy, CAD, left knee pain at times    Examination-Activity Limitations Bed Mobility;Carry;Squat;Sit;Stairs;Lift;Locomotion Level;Other    Stability/Clinical Decision Making Evolving/Moderate complexity    Clinical Decision Making High    Rehab Potential Good    PT Frequency 2x / week    PT Duration 8 weeks  PT Treatment/Interventions ADLs/Self Care Home Management;Functional mobility training;Therapeutic activities;Therapeutic exercise;Neuromuscular re-education;Gait training;Stair training;Patient/family  education;Manual techniques;Passive range of motion    PT Next Visit Plan continue with balance training; continue to progress strengthein; review UE and LE HEP to increase intensity, Nu-step endurance 5 intervals of 2 minute at level 3 next    PT Home Exercise Plan [facility]    Consulted and Agree with Plan of Care Patient             Patient will benefit from skilled therapeutic intervention in order to improve the following deficits and impairments:  Abnormal gait, Cardiopulmonary status limiting activity, Decreased mobility, Decreased activity tolerance, Decreased endurance, Decreased strength, Difficulty walking, Decreased range of motion, Improper body mechanics, Postural dysfunction  Visit Diagnosis: Other abnormalities of gait and mobility  Difficulty in walking, not elsewhere classified  Muscle weakness (generalized)     Problem List Patient Active Problem List   Diagnosis Date Noted   CKD (chronic kidney disease) stage 3, GFR 30-59 ml/min ([name]) 05/28/2021   Adjustment disorder 01/22/2021   Chronic back pain 10/22/2020   Peripheral vertigo 05/15/2020   Dermatitis 01/04/2020   Unintentional weight loss with loose stools 05/19/2019   Low vitamin B12 level 03/29/2019   Heme positive stool 03/14/2019   Normocytic anemia 03/14/2019   Chronic diastolic heart failure ([place]) 02/08/2019   Diabetic retinopathy ([place]) 09/08/2018   Physical debility 05/24/2018   Varicose veins of both lower extremities 03/01/2018   Fatigue due to exertion 10/14/2017   GERD (gastroesophageal reflux disease) 08/25/2017   CAD in native artery 04/14/2017   Hyperlipidemia associated with type 2 diabetes mellitus ([place]) 04/14/2017   Diabetic peripheral neuropathy associated with type 2 diabetes mellitus ([place]) 08/17/2016   T2DM (type 2 diabetes mellitus) ([place]) 05/02/2016   Hypertension associated with diabetes ([place]) 05/02/2016    Carney Living, PT 05/30/2021, 11:18 AM  [facility] [address] [place], [place], [address] Phone: [phone]   Fax:  [phone]  Name: Jeffrey [name] MRN: [id number] Date of Birth: 20-Dec-1955

## 2021-06-02 ENCOUNTER — Encounter: Payer: Self-pay | Admitting: Family Medicine

## 2021-06-02 ENCOUNTER — Encounter (HOSPITAL_BASED_OUTPATIENT_CLINIC_OR_DEPARTMENT_OTHER): Payer: Self-pay

## 2021-06-03 ENCOUNTER — Encounter: Payer: Self-pay | Admitting: Family Medicine

## 2021-06-03 ENCOUNTER — Other Ambulatory Visit: Payer: Self-pay

## 2021-06-03 ENCOUNTER — Encounter (HOSPITAL_BASED_OUTPATIENT_CLINIC_OR_DEPARTMENT_OTHER): Payer: HMO | Admitting: Physical Therapy

## 2021-06-03 ENCOUNTER — Ambulatory Visit (INDEPENDENT_AMBULATORY_CARE_PROVIDER_SITE_OTHER): Payer: HMO | Admitting: Family Medicine

## 2021-06-03 VITALS — BP 121/66 | HR 76 | Temp 97.4°F | Ht 68.0 in | Wt 159.6 lb

## 2021-06-03 DIAGNOSIS — E1159 Type 2 diabetes mellitus with other circulatory complications: Secondary | ICD-10-CM

## 2021-06-03 DIAGNOSIS — I152 Hypertension secondary to endocrine disorders: Secondary | ICD-10-CM | POA: Diagnosis not present

## 2021-06-03 DIAGNOSIS — E1142 Type 2 diabetes mellitus with diabetic polyneuropathy: Secondary | ICD-10-CM | POA: Diagnosis not present

## 2021-06-03 DIAGNOSIS — Z794 Long term (current) use of insulin: Secondary | ICD-10-CM

## 2021-06-03 MED ORDER — SPIRONOLACTONE 25 MG PO TABS: 25.0000 mg | ORAL_TABLET | Freq: Every day | ORAL | 3 refills | Status: DC

## 2021-06-03 NOTE — Patient Instructions (Signed)
It was very nice to see you today!  We will restart spironolactone.  Keep an eye on your blood pressure.  Let me know if your swelling does not improve.  I will see back in 2 months.  Come back sooner if needed.  Take care, Dr [name]  PLEASE NOTE:  If you had any lab tests please let us know if you have not heard back within a few days. You may see your results on mychart before we have a chance to review them but we will give you a call once they are reviewed by [place]. If we ordered any referrals today, please let us know if you have not heard from their office within the next week.   Please try these tips to maintain a healthy lifestyle:  Eat at least 3 REAL meals and 1-2 snacks per day.  Aim for no more than 5 hours between eating.  If you eat breakfast, please do so within one hour of getting up.   Each meal should contain half fruits/vegetables, one quarter protein, and one quarter carbs (no bigger than a computer mouse)  Cut down on sweet beverages. This includes juice, soda, and sweet tea.   Drink at least 1 glass of water with each meal and aim for at least 8 glasses per day  Exercise at least 150 minutes every week.

## 2021-06-03 NOTE — Telephone Encounter (Signed)
FYI  Patient has appointment With PCP today

## 2021-06-03 NOTE — Assessment & Plan Note (Signed)
Did not tolerate switch to losartan from spironolactone due to lower extremity swelling.  We will switch back to previous regimen spironolactone 25 mg daily and Coreg 3.125 mg twice daily.  He will follow-up with cardiology soon.  Discussed home blood pressure monitoring.  Follow-up in a couple of months with me.

## 2021-06-03 NOTE — Telephone Encounter (Signed)
Patient has appointment today

## 2021-06-03 NOTE — Progress Notes (Signed)
   Jeffrey [name] is a 65 y.o. male who presents today for an office visit.  Assessment/Plan:  Chronic Problems Addressed Today: Hypertension associated with diabetes ([place]) Did not tolerate switch to losartan from spironolactone due to lower extremity swelling.  We will switch back to previous regimen spironolactone 25 mg daily and Coreg 3.125 mg twice daily.  He will follow-up with cardiology soon.  Discussed home blood pressure monitoring.  Follow-up in a couple of months with me.  [name] Last A1c 7.4.  Continue current regimen metformin 1000 mg twice daily and Lantus 30 units daily.  Cardiology recommended starting Farxiga or Jardiance.  I think this would be a good idea.  He will wait till next year once his insurance approves [place].     Subjective:  HPI:  See A/P for status of chronic conditions.  Patient recently saw his cardiologist.  Was switched to losartan from spironolactone.  Since then he has noticed significant increase in leg edema.  Blood pressures have been at goal.       Objective:  Physical Exam: BP 121/66   Pulse 76   Temp (!) 97.4 F (36.3 C) (Temporal)   Ht 5\' 8"  (1.727 m)   Wt 159 lb 9.6 oz (72.4 kg)   SpO2 99%   BMI 24.27 kg/m   Gen: No acute distress, resting comfortably CV: Regular rate and rhythm with no murmurs appreciated Pulm: Normal work of breathing, clear to auscultation bilaterally with no crackles, wheezes, or rhonchi MSK: 1+ pitting edema to mid shin bilaterally. Neuro: Grossly normal, moves all extremities Psych: Normal affect and thought content  Time Spent: 45 minutes of total time was spent on the date of the encounter performing the following actions: chart review prior to seeing the patient including recent visit with cardiology, obtaining history, performing a medically necessary exam, counseling on the treatment plan, placing orders, and documenting in our EHR.        Jeffrey [name]. [name],  MD 06/03/2021 4:10 PM

## 2021-06-03 NOTE — Assessment & Plan Note (Signed)
Last A1c 7.4.  Continue current regimen metformin 1000 mg twice daily and Lantus 30 units daily.  Cardiology recommended starting Farxiga or Jardiance.  I think this would be a good idea.  He will wait till next year once his insurance approves [place].

## 2021-06-04 ENCOUNTER — Ambulatory Visit: Payer: HMO | Admitting: Family Medicine

## 2021-06-05 ENCOUNTER — Encounter (HOSPITAL_BASED_OUTPATIENT_CLINIC_OR_DEPARTMENT_OTHER): Payer: HMO | Admitting: Physical Therapy

## 2021-06-07 ENCOUNTER — Telehealth (HOSPITAL_BASED_OUTPATIENT_CLINIC_OR_DEPARTMENT_OTHER): Payer: Self-pay | Admitting: Cardiovascular Disease

## 2021-06-07 NOTE — Telephone Encounter (Signed)
Below from Dr Oval Linsey   He will start [name] in January once it is covered by insurance.   TCR

## 2021-06-07 NOTE — Telephone Encounter (Signed)
Called pt based on last MyChart messages. I explained that I wanted to get a better understanding of what was going on. Pt brother, Jeffrey [name] (while pt yelling in the room), stated he is very disheartened because he feels he and his brother have been overlooked by the staff. He stated pt was on spironolactone and Carvedilol before seeing Dr. Oval Linsey, was also in Physical Therapy. He stated Dr. Oval Linsey changed his medication to Losartan and stopped the spironolactone. Explained to Jeffrey [name] this change was made due to kidney function. Jeffrey [name] then stated Dr. [name] said his kidney function is stable from a year ago. Jeffrey [name] stated Dr. [name] will be monitoring kidneys and managing medications from now on. He stated pt had to stop PT because the swelling in his feet got so bad.   Jeffrey [name] stated when he sent the first message, no one responded for days. Then when he did get a response, it was about trying medications that pt had already been on...such as spironolactone and carvedilol. Jeffrey [name] stated his brother had no issues on these medications and thinks "Dr. Oval Linsey had no business changing any of his medications. From now on it will only be Dr. [name]. He stated he feels the staff is very incompetent because they have no idea what is going on with their patients and they do not go back and read messages." He stated he was "VERY clear with all the messages he sent and the staff made no sense."   Asked Jeffrey [name] about pt feet. He stated pt feet are still swollen and he is not able to start PT until next week. Jeffrey [name] insisted on speaking ONLY  with Dr. Oval Linsey because "that is who we had a relationship with." I offered having Dr. Blenda Mounts nurse call him..he did not want to speak with anyone else except Dr. Oval Linsey and insisted on this several times.  While on the phone, I was unable to redirect the conversation. Jeffrey [name] continued explaining the whole story, multiple times as if I did not understand. Attempted  explaining to pt that it looked like there was miscommunication on both sides and I was just trying to better understand the situation. This angered Jeffrey [name], who stated loudly, "There was no miscommunication on my part at all, only with the staff that doesn't care to understand their patients."  Explained (talking over the pt brother) that I am going to type everything out from our conversation and send to Dr. Oval Linsey, her nurse, and our Practice Administrator and they will call him back today. He stated he has things to do and will not be chained to the phone so he will answer if he can.

## 2021-06-07 NOTE — Telephone Encounter (Signed)
See MyChart messages. Dr. Oval Linsey spoke with patient and situation has been resolved.

## 2021-06-10 ENCOUNTER — Encounter (HOSPITAL_BASED_OUTPATIENT_CLINIC_OR_DEPARTMENT_OTHER): Payer: HMO | Admitting: Physical Therapy

## 2021-06-10 NOTE — Telephone Encounter (Signed)
Dr [place] spoke with patient  ?

## 2021-06-12 ENCOUNTER — Ambulatory Visit: Payer: HMO | Admitting: Family Medicine

## 2021-06-12 ENCOUNTER — Ambulatory Visit (HOSPITAL_BASED_OUTPATIENT_CLINIC_OR_DEPARTMENT_OTHER): Payer: HMO | Admitting: Physical Therapy

## 2021-06-14 ENCOUNTER — Other Ambulatory Visit: Payer: Self-pay

## 2021-06-14 ENCOUNTER — Ambulatory Visit (HOSPITAL_BASED_OUTPATIENT_CLINIC_OR_DEPARTMENT_OTHER): Payer: HMO | Attending: Family Medicine | Admitting: Physical Therapy

## 2021-06-14 ENCOUNTER — Encounter (HOSPITAL_BASED_OUTPATIENT_CLINIC_OR_DEPARTMENT_OTHER): Payer: Self-pay | Admitting: Physical Therapy

## 2021-06-14 DIAGNOSIS — R2689 Other abnormalities of gait and mobility: Secondary | ICD-10-CM | POA: Insufficient documentation

## 2021-06-14 DIAGNOSIS — M6281 Muscle weakness (generalized): Secondary | ICD-10-CM | POA: Diagnosis not present

## 2021-06-14 DIAGNOSIS — R262 Difficulty in walking, not elsewhere classified: Secondary | ICD-10-CM | POA: Diagnosis not present

## 2021-06-14 NOTE — Therapy (Signed)
[place], [place], [address] Phone: [phone]   Fax:  [phone]  Physical Therapy Treatment  Patient Details  Name: Jeffrey [name] MRN: [id number] Date of Birth: 04-17-1956 Referring Provider (PT): Dimas Chyle MD   Encounter Date: 06/14/2021   PT End of Session - 06/14/21 1220     Visit Number 26    Number of Visits 31    Date for PT Re-Evaluation 06/17/21    Authorization Type HTA 2022 30 dollars progress note perfromed at visit 74. Kx modifier applied    PT Start Time 1200   Patient was 15 minutes late   PT Stop Time 1240    PT Time Calculation (min) 40 min    Activity Tolerance Patient tolerated treatment well    Behavior During Therapy [facility] for tasks assessed/performed             Past Medical History:  Diagnosis Date   Cardiac arrest ([place]) 05/22/2016   Cataract    CHF (congestive heart failure) ([name])    CKD (chronic kidney disease) stage 3, GFR 30-59 ml/min ([place]) 05/28/2021   Diabetes mellitus without complication ([place])    type 2   Diabetic retinopathy ([place])    Hyperlipidemia    Hypertension    Memory loss    mild   Myocardial infarct ([place]) 2105   Retinopathy    Both eyes   S/P primary angioplasty with coronary stent 05/22/2016   Vitamin B12 deficiency    Vitreous hemorrhage of left eye ([place])    proliferative diabetic retinopathy    Past Surgical History:  Procedure Laterality Date   ANGIOPLASTY     2015   COLONOSCOPY     multiple   EYE SURGERY     PARS PLANA VITRECTOMY Left 03/15/2020   Procedure: PARS PLANA VITRECTOMY WITH 25 GAUGE;  Surgeon: [name], MD;  Location: [facility];  Service: Ophthalmology;  Laterality: Left;   PHOTOCOAGULATION WITH LASER Left 03/15/2020   Procedure: PHOTOCOAGULATION WITH LASER; INTRAVITREAL INJECTION OF AVASTIN;  Surgeon: [name], MD;  Location: [facility];  Service: Ophthalmology;  Laterality: Left;   REFRACTIVE SURGERY     UPPER GI  ENDOSCOPY     several   VITRECTOMY      There were no vitals filed for this visit.            Lumbar Exercises: Machines for Strengthening                  Shoulder press 3x10 10lbs  Row 3x10 10 lbs  [place] 2x10 20 lbs 1x 10lb 1x  Reviewed use of the easy step elliptical  3 min Nu-step 7 min                            PT Short Term Goals - 04/08/21 1331       PT SHORT TERM GOAL #1   Title Patient will perfrom all 5 stands in 14 seconds    Baseline Perfromed all 5x goal met and goal updated    Status Revised    Target Date 06/03/21      PT SHORT TERM GOAL #2   Title Patient will increase bilateral LE strength to 4+/5    Baseline goal met and revised    Status Revised    Target Date 06/03/21      PT SHORT TERM GOAL #3   Title Patients will perfrom narrow  base of support with SBA    Baseline perfroming on air-ex mat    Time 4    Period Weeks    Status On-going      PT SHORT TERM GOAL #4   Title Patient will increase 6 min walk ttest to 600'    Period Weeks    Status New    Target Date 06/03/21               PT Long Term Goals - 05/23/21 1439       PT LONG TERM GOAL #1   Title Patient will ambualte 1000' on 6 min walk test in order to improve ability to ambualte int he community    Time 8    Period Weeks    Status On-going      PT LONG TERM GOAL #2   Title Patient will demonstrate a 30 on the BERG balance scale    Time 6    Period Weeks    Status On-going      PT LONG TERM GOAL #3   Title Patient wil be independent with a gym program to improve strength and endurance    Baseline wokring on gym program    Time 6    Period Weeks    Status Achieved      PT LONG TERM GOAL #4   Title Pt to demo improved LE strength to 5/5 to improve stability and ability for gait.    Time 6    Period Weeks    Status Achieved                   Plan - 06/14/21 1225     Clinical Impression Statement Despite layoff 2nd  to swelling in his feet, the patient did well. e was still able to do weights. He only had minor fatigue but overall he tolerated well. Despite set back he is sitll on track to D/C soon to his HEP. We will re-assess him next visit and likely go a few more visit to make sure he    Comorbidities DMII with neuropathy, CAD, left knee pain at times    Examination-Activity Limitations Bed Mobility;Carry;Squat;Sit;Stairs;Lift;Locomotion Level;Other    Examination-Participation Restrictions Cleaning;Community Activity;Driving;Laundry;Shop    Stability/Clinical Decision Making Evolving/Moderate complexity    Clinical Decision Making High    Rehab Potential Good    PT Frequency 2x / week    PT Duration 8 weeks    PT Treatment/Interventions ADLs/Self Care Home Management;Functional mobility training;Therapeutic activities;Therapeutic exercise;Neuromuscular re-education;Gait training;Stair training;Patient/family education;Manual techniques;Passive range of motion    PT Next Visit Plan continue with balance training; continue to progress strengthein; review UE and LE HEP to increase intensity, Nu-step endurance 5 intervals of 2 minute at level 3 next    PT Home Exercise Plan [facility]    Consulted and Agree with Plan of Care Patient             Patient will benefit from skilled therapeutic intervention in order to improve the following deficits and impairments:  Abnormal gait, Cardiopulmonary status limiting activity, Decreased mobility, Decreased activity tolerance, Decreased endurance, Decreased strength, Difficulty walking, Decreased range of motion, Improper body mechanics, Postural dysfunction  Visit Diagnosis: Other abnormalities of gait and mobility  Difficulty in walking, not elsewhere classified  Muscle weakness (generalized)     Problem List Patient Active Problem List   Diagnosis Date Noted   CKD (chronic kidney disease) stage 3, GFR 30-59 ml/min ([name]) 05/28/2021   Adjustment  disorder 01/22/2021   Chronic back pain 10/22/2020   Peripheral vertigo 05/15/2020   Dermatitis 01/04/2020   Unintentional weight loss with loose stools 05/19/2019   Low vitamin B12 level 03/29/2019   Heme positive stool 03/14/2019   Normocytic anemia 03/14/2019   Chronic diastolic heart failure ([place]) 02/08/2019   Diabetic retinopathy ([place]) 09/08/2018   Physical debility 05/24/2018   Varicose veins of both lower extremities 03/01/2018   Fatigue due to exertion 10/14/2017   GERD (gastroesophageal reflux disease) 08/25/2017   CAD in native artery 04/14/2017   Hyperlipidemia associated with type 2 diabetes mellitus ([place]) 04/14/2017   Diabetic peripheral neuropathy associated with type 2 diabetes mellitus ([place]) 08/17/2016   T2DM (type 2 diabetes mellitus) ([place]) 05/02/2016   Hypertension associated with diabetes ([place]) 05/02/2016    Carney Living, PT 06/14/2021, 12:45 PM  [facility] [address] [place], [place], [address] Phone: [phone]   Fax:  [phone]  Name: Alvin Diffee MRN: [id number] Date of Birth: 24-Dec-1955

## 2021-06-18 ENCOUNTER — Ambulatory Visit (HOSPITAL_BASED_OUTPATIENT_CLINIC_OR_DEPARTMENT_OTHER): Payer: HMO | Admitting: Physical Therapy

## 2021-06-20 ENCOUNTER — Telehealth: Payer: Self-pay | Admitting: *Deleted

## 2021-06-20 NOTE — Chronic Care Management (AMB) (Signed)
  Chronic Care Management   Note  06/20/2021 Name: Jeffrey [name] MRN: [id number] DOB: 12/18/1955  Jeffrey [name] is a 65 y.o. year old male who is a primary care patient of Vivi Barrack, MD. I reached out to Jeffrey [name] by phone today in response to a referral sent by [name]'s PCP.  Mr. Aumiller was given information about Chronic Care Management services today including:  CCM service includes personalized support from designated clinical staff supervised by his physician, including individualized plan of care and coordination with other care providers 24/7 contact phone numbers for assistance for urgent and routine care needs. Service will only be billed when office clinical staff spend 20 minutes or more in a month to coordinate care. Only one practitioner may furnish and bill the service in a calendar month. The patient may stop CCM services at any time (effective at the end of the month) by phone call to the office staff. The patient is responsible for co-pay (up to 20% after annual deductible is met) if co-pay is required by the individual health plan.   Patient brother Jeffrey [name],Jeffrey [name] DPR on file did not agree to enrollment in care management services and does not wish to consider at this time.  Follow up plan: Patient brother Jeffrey [name],Jeffrey [name] DPR on file declines engagement by the care management team. Appropriate care team members and provider have been notified via electronic communication. The care management team is available to follow up with the patient after provider conversation with the patient regarding recommendation for care management engagement and subsequent re-referral to the care management team.   [facility]  Direct Dial: [phone]

## 2021-06-21 ENCOUNTER — Encounter: Payer: HMO | Admitting: Internal Medicine

## 2021-06-24 ENCOUNTER — Ambulatory Visit (HOSPITAL_BASED_OUTPATIENT_CLINIC_OR_DEPARTMENT_OTHER): Payer: HMO | Admitting: Physical Therapy

## 2021-06-26 ENCOUNTER — Encounter (HOSPITAL_BASED_OUTPATIENT_CLINIC_OR_DEPARTMENT_OTHER): Payer: Self-pay | Admitting: Physical Therapy

## 2021-06-28 ENCOUNTER — Other Ambulatory Visit: Payer: Self-pay

## 2021-06-28 ENCOUNTER — Telehealth: Payer: Self-pay

## 2021-06-28 DIAGNOSIS — R7989 Other specified abnormal findings of blood chemistry: Secondary | ICD-10-CM

## 2021-06-28 DIAGNOSIS — E1159 Type 2 diabetes mellitus with other circulatory complications: Secondary | ICD-10-CM

## 2021-06-28 DIAGNOSIS — I152 Hypertension secondary to endocrine disorders: Secondary | ICD-10-CM

## 2021-06-28 NOTE — Telephone Encounter (Signed)
Lab placed

## 2021-06-28 NOTE — Telephone Encounter (Signed)
Patient brother called in and wanted to see if he could get his kidney rechecked and urine recheck.

## 2021-07-01 NOTE — Telephone Encounter (Signed)
Patient has been scheduled

## 2021-07-02 ENCOUNTER — Other Ambulatory Visit: Payer: Self-pay | Admitting: Family Medicine

## 2021-07-03 ENCOUNTER — Ambulatory Visit (HOSPITAL_BASED_OUTPATIENT_CLINIC_OR_DEPARTMENT_OTHER): Payer: HMO | Admitting: Physical Therapy

## 2021-07-03 ENCOUNTER — Other Ambulatory Visit: Payer: Self-pay

## 2021-07-03 DIAGNOSIS — R262 Difficulty in walking, not elsewhere classified: Secondary | ICD-10-CM

## 2021-07-03 DIAGNOSIS — R2689 Other abnormalities of gait and mobility: Secondary | ICD-10-CM | POA: Diagnosis not present

## 2021-07-03 DIAGNOSIS — M6281 Muscle weakness (generalized): Secondary | ICD-10-CM

## 2021-07-05 ENCOUNTER — Encounter (HOSPITAL_BASED_OUTPATIENT_CLINIC_OR_DEPARTMENT_OTHER): Payer: Self-pay | Admitting: Physical Therapy

## 2021-07-05 NOTE — Therapy (Signed)
[facility] [address] [place], [place], [address] Phone: [phone]   Fax:  [phone]  Physical Therapy Treatment/Progress note   Patient Details  Name: Jeffrey [name] MRN: [id number] Date of Birth: 06/14/1956 Referring Provider (PT): Dimas Chyle MD Progress Note Reporting Period 09/26/022 to 07/03/2021  See note below for Objective Data and Assessment of Progress/Goals.      Encounter Date: 07/03/2021   PT End of Session - 07/05/21 0749     Visit Number 27    Number of Visits 39    Date for PT Re-Evaluation 08/16/21    Authorization Type HTA 2022 30 dollars progress note perfromed at visit 21. Kx modifier applied    PT Start Time 1572    PT Stop Time 1228    PT Time Calculation (min) 43 min    Activity Tolerance Patient tolerated treatment well    Behavior During Therapy [facility] for tasks assessed/performed             Past Medical History:  Diagnosis Date   Cardiac arrest ([place]) 05/22/2016   Cataract    CHF (congestive heart failure) ([name])    CKD (chronic kidney disease) stage 3, GFR 30-59 ml/min ([name]) 05/28/2021   Diabetes mellitus without complication ([place])    type 2   Diabetic retinopathy ([place])    Hyperlipidemia    Hypertension    Memory loss    mild   Myocardial infarct ([place]) 2105   Retinopathy    Both eyes   S/P primary angioplasty with coronary stent 05/22/2016   Vitamin B12 deficiency    Vitreous hemorrhage of left eye ([place])    proliferative diabetic retinopathy    Past Surgical History:  Procedure Laterality Date   ANGIOPLASTY     2015   COLONOSCOPY     multiple   EYE SURGERY     PARS PLANA VITRECTOMY Left 03/15/2020   Procedure: PARS PLANA VITRECTOMY WITH 25 GAUGE;  Surgeon: [name], MD;  Location: [facility];  Service: Ophthalmology;  Laterality: Left;   PHOTOCOAGULATION WITH LASER Left 03/15/2020   Procedure: PHOTOCOAGULATION WITH LASER; INTRAVITREAL INJECTION OF AVASTIN;  Surgeon:  [name], MD;  Location: [facility];  Service: Ophthalmology;  Laterality: Left;   REFRACTIVE SURGERY     UPPER GI ENDOSCOPY     several   VITRECTOMY      There were no vitals filed for this visit.   Subjective Assessment - 07/05/21 0747     Subjective Patiets brother reports he has not been able to walk as much since he had a mix up with his meduication. He come in today with limited hip flexion with ambualtion. He repeorts he has not been able to do much over the week.    Patient is accompained by: Family member    Pertinent History CAD; MI 2015 ( corrected per patient) ; DMII with peripheral neuropathy    How long can you stand comfortably? depends on the day    How long can you walk comfortably? has walked a 1/4 mile but it took 2 hours    Diagnostic tests lumbar x-ray: recent Facet hypertrophy at L4-5 and L5-S1.  Aortic atherosclerosis    Currently in Pain? No/denies                [facility] PT Assessment - 07/05/21 0001       Assessment   Medical Diagnosis genralized weakness/ Decreased endurance/ general debility    Referring Provider (PT) Dimas Chyle  MD      Strength   Right Hip Flexion 3+/5    Right Hip ABduction 4/5    Left Hip Flexion 3+/5    Left Hip ABduction 4/5    Right Knee Flexion 4+/5    Right Knee Extension 4+/5    Left Knee Flexion 4+/5    Left Knee Extension 4+/5      Transfers   Five time sit to stand comments  20 seconds but required walker      Ambulation/Gait   Gait Comments 6 min walk test                           [facility] Adult PT Treatment/Exercise - 07/05/21 0001       Lumbar Exercises: Aerobic   Nustep l3 5 MIN      Lumbar Exercises: Standing   Other Standing Lumbar Exercises hip abduction 3x10, hip march 3x10 with support for both exercises cuing for hip flexion, hip extension 3x10 for each leg                     PT Education - 07/05/21 0748     Education Details improtance of doing home exercises     Person(s) Educated Patient    Methods Explanation;Demonstration;Verbal cues;Tactile cues    Comprehension Verbalized understanding;Verbal cues required;Returned demonstration;Tactile cues required              PT Short Term Goals - 07/05/21 0753       PT SHORT TERM GOAL #1   Title Patient will perfrom all 5 stands in 14 seconds    Baseline 20 sec    Time 4    Period Weeks    Status On-going    Target Date 06/03/21      PT SHORT TERM GOAL #2   Title Patient will increase bilateral LE strength to 4+/5    Baseline decreased strength compared to last visit    Time 4    Period Weeks    Target Date 06/03/21      PT SHORT TERM GOAL #3   Title Patients will perfrom narrow base of support with SBA    Baseline perfroming on air-ex mat    Time 4    Period Weeks    Status On-going    Target Date 03/08/21      PT SHORT TERM GOAL #4   Title Patient will increase 6 min walk ttest to 600'    Baseline 270    Time 3    Period Weeks    Status On-going    Target Date 07/26/21               PT Long Term Goals - 07/05/21 0755       PT LONG TERM GOAL #1   Title Patient will ambualte 1000' on 6 min walk test in order to improve ability to ambualte int he community    Time 8    Period Weeks    Status On-going      PT LONG TERM GOAL #2   Title Patient will demonstrate a 30 on the BERG balance scale    Time 6    Period Weeks    Status On-going      PT LONG TERM GOAL #3   Title Patient wil be independent with a gym program to improve strength and endurance    Baseline wokring on gym program  Time 6    Period Weeks    Status On-going      PT LONG TERM GOAL #4   Title Pt to demo improved LE strength to 5/5 to improve stability and ability for gait.    Time 6    Period Weeks    Status On-going                   Plan - 07/05/21 0749     Clinical Impression Statement Patient's functional test numbers have all decreased since his last assessement. He has  not been able to do much because of swelling in his feet. He is very motivated to start his gym prgram, but he would benefit from more visits to improve his ability to [facility] and get back on and off the equipment safely.    Personal Factors and Comorbidities Fitness;Past/Current Experience;Comorbidity 3+;Time since onset of injury/illness/exacerbation    Comorbidities DMII with neuropathy, CAD, left knee pain at times    Examination-Activity Limitations Bed Mobility;Carry;Squat;Sit;Stairs;Lift;Locomotion Level;Other    Examination-Participation Restrictions Cleaning;Community Activity;Driving;Laundry;Shop    Stability/Clinical Decision Making Evolving/Moderate complexity    Clinical Decision Making High    Rehab Potential Good    PT Frequency 2x / week    PT Duration 6 weeks    PT Treatment/Interventions ADLs/Self Care Home Management;Functional mobility training;Therapeutic activities;Therapeutic exercise;Neuromuscular re-education;Gait training;Stair training;Patient/family education;Manual techniques;Passive range of motion    PT Next Visit Plan continue with balance training; continue to progress strengthein; review UE and LE HEP to increase intensity, Nu-step endurance 5 intervals of 2 minute at level 3 next    PT Home Exercise Plan [facility]    Consulted and Agree with Plan of Care Patient             Patient will benefit from skilled therapeutic intervention in order to improve the following deficits and impairments:  Abnormal gait, Cardiopulmonary status limiting activity, Decreased mobility, Decreased activity tolerance, Decreased endurance, Decreased strength, Difficulty walking, Decreased range of motion, Improper body mechanics, Postural dysfunction  Visit Diagnosis: Other abnormalities of gait and mobility  Difficulty in walking, not elsewhere classified  Muscle weakness (generalized)     Problem List Patient Active Problem List   Diagnosis Date Noted   CKD (chronic  kidney disease) stage 3, GFR 30-59 ml/min ([name]) 05/28/2021   Adjustment disorder 01/22/2021   Chronic back pain 10/22/2020   Peripheral vertigo 05/15/2020   Dermatitis 01/04/2020   Unintentional weight loss with loose stools 05/19/2019   Low vitamin B12 level 03/29/2019   Heme positive stool 03/14/2019   Normocytic anemia 03/14/2019   Chronic diastolic heart failure ([place]) 02/08/2019   Diabetic retinopathy ([place]) 09/08/2018   Physical debility 05/24/2018   Varicose veins of both lower extremities 03/01/2018   Fatigue due to exertion 10/14/2017   GERD (gastroesophageal reflux disease) 08/25/2017   CAD in native artery 04/14/2017   Hyperlipidemia associated with type 2 diabetes mellitus ([place]) 04/14/2017   Diabetic peripheral neuropathy associated with type 2 diabetes mellitus ([place]) 08/17/2016   T2DM (type 2 diabetes mellitus) ([place]) 05/02/2016   Hypertension associated with diabetes ([place]) 05/02/2016    Carney Living, PT 07/05/2021, 7:57 AM  [facility] [address] [place], [place], [address] Phone: [phone]   Fax:  [phone]  Name: Jeffrey [name] MRN: [id number] Date of Birth: [birth year]

## 2021-07-09 ENCOUNTER — Ambulatory Visit (HOSPITAL_BASED_OUTPATIENT_CLINIC_OR_DEPARTMENT_OTHER): Payer: HMO | Admitting: Physical Therapy

## 2021-07-18 ENCOUNTER — Encounter (HOSPITAL_BASED_OUTPATIENT_CLINIC_OR_DEPARTMENT_OTHER): Payer: Self-pay | Admitting: Physical Therapy

## 2021-07-18 ENCOUNTER — Other Ambulatory Visit: Payer: Self-pay

## 2021-07-18 ENCOUNTER — Ambulatory Visit (HOSPITAL_BASED_OUTPATIENT_CLINIC_OR_DEPARTMENT_OTHER): Payer: HMO | Attending: Family Medicine | Admitting: Physical Therapy

## 2021-07-18 DIAGNOSIS — M6281 Muscle weakness (generalized): Secondary | ICD-10-CM

## 2021-07-18 DIAGNOSIS — R262 Difficulty in walking, not elsewhere classified: Secondary | ICD-10-CM

## 2021-07-18 DIAGNOSIS — R2689 Other abnormalities of gait and mobility: Secondary | ICD-10-CM | POA: Diagnosis not present

## 2021-07-18 NOTE — Therapy (Signed)
[facility] [address] [place], [place], [address] Phone: [phone]   Fax:  [phone]  Physical Therapy Treatment  Patient Details  Name: Jeffrey [name] MRN: [id number] Date of Birth: 1956/01/04 Referring Provider (PT): Dimas Chyle MD   Encounter Date: 07/18/2021   PT End of Session - 07/18/21 1048     Visit Number 28    Number of Visits 8    Date for PT Re-Evaluation 08/16/21    PT Start Time 6433    PT Stop Time 1055    PT Time Calculation (min) 40 min    Activity Tolerance Patient tolerated treatment well    Behavior During Therapy [facility] for tasks assessed/performed             Past Medical History:  Diagnosis Date   Cardiac arrest ([place]) 05/22/2016   Cataract    CHF (congestive heart failure) ([place])    CKD (chronic kidney disease) stage 3, GFR 30-59 ml/min ([place]) 05/28/2021   Diabetes mellitus without complication ([place])    type 2   Diabetic retinopathy ([place])    Hyperlipidemia    Hypertension    Memory loss    mild   Myocardial infarct ([place]) 2105   Retinopathy    Both eyes   S/P primary angioplasty with coronary stent 05/22/2016   Vitamin B12 deficiency    Vitreous hemorrhage of left eye ([place])    proliferative diabetic retinopathy    Past Surgical History:  Procedure Laterality Date   ANGIOPLASTY     2015   COLONOSCOPY     multiple   EYE SURGERY     PARS PLANA VITRECTOMY Left 03/15/2020   Procedure: PARS PLANA VITRECTOMY WITH 25 GAUGE;  Surgeon: [name], MD;  Location: [facility];  Service: Ophthalmology;  Laterality: Left;   PHOTOCOAGULATION WITH LASER Left 03/15/2020   Procedure: PHOTOCOAGULATION WITH LASER; INTRAVITREAL INJECTION OF AVASTIN;  Surgeon: [name], MD;  Location: [facility];  Service: Ophthalmology;  Laterality: Left;   REFRACTIVE SURGERY     UPPER GI ENDOSCOPY     several   VITRECTOMY      There were no vitals filed for this visit.   Subjective Assessment - 07/18/21 1020      Subjective Patient continues to feel like he is very weak. he does not want to join the gy until he is feeling more comfortbale getting on and off equipment. He has needed more help around the house to do his daily activity    Patient is accompained by: Family member    Pertinent History CAD; MI 2015 ( corrected per patient) ; DMII with peripheral neuropathy    How long can you stand comfortably? depends on the day    How long can you walk comfortably? has walked a 1/4 mile but it took 2 hours    Diagnostic tests lumbar x-ray: recent Facet hypertrophy at L4-5 and L5-S1.  Aortic atherosclerosis    Currently in Pain? No/denies             Bicep curl 2x10 10 lbs  Row 3x10 10lbs  Chest press 2x10 10lbs  Nu-step 5 min  Leg press 3x10 40 lbs                              PT Education - 07/18/21 1048     Education Details set-up of machines    Person(s) Educated Patient    Methods Explanation;Demonstration;Tactile  cues;Verbal cues    Comprehension Verbalized understanding;Returned demonstration;Verbal cues required;Tactile cues required              PT Short Term Goals - 07/05/21 0753       PT SHORT TERM GOAL #1   Title Patient will perfrom all 5 stands in 14 seconds    Baseline 20 sec    Time 4    Period Weeks    Status On-going    Target Date 06/03/21      PT SHORT TERM GOAL #2   Title Patient will increase bilateral LE strength to 4+/5    Baseline decreased strength compared to last visit    Time 4    Period Weeks    Target Date 06/03/21      PT SHORT TERM GOAL #3   Title Patients will perfrom narrow base of support with SBA    Baseline perfroming on air-ex mat    Time 4    Period Weeks    Status On-going    Target Date 03/08/21      PT SHORT TERM GOAL #4   Title Patient will increase 6 min walk ttest to 600'    Baseline 270    Time 3    Period Weeks    Status On-going    Target Date 07/26/21               PT Long  Term Goals - 07/05/21 0755       PT LONG TERM GOAL #1   Title Patient will ambualte 1000' on 6 min walk test in order to improve ability to ambualte int he community    Time 8    Period Weeks    Status On-going      PT LONG TERM GOAL #2   Title Patient will demonstrate a 30 on the BERG balance scale    Time 6    Period Weeks    Status On-going      PT LONG TERM GOAL #3   Title Patient wil be independent with a gym program to improve strength and endurance    Baseline wokring on gym program    Time 6    Period Weeks    Status On-going      PT LONG TERM GOAL #4   Title Pt to demo improved LE strength to 5/5 to improve stability and ability for gait.    Time 6    Period Weeks    Status On-going                   Plan - 07/18/21 1052     Clinical Impression Statement Patient requires a little more guarding getting on an off machines compared to prior to his heart isssues. He overall tolerated as similar workout though. He wwas able to vcomplete a workout that included upper and lower bodya exercises. He had a little less hip flexion walkingfollowi    Comorbidities DMII with neuropathy, CAD, left knee pain at times    Examination-Activity Limitations Bed Mobility;Carry;Squat;Sit;Stairs;Lift;Locomotion Level;Other    Examination-Participation Restrictions Cleaning;Community Activity;Driving;Laundry;Shop    Stability/Clinical Decision Making Evolving/Moderate complexity    Clinical Decision Making High    Rehab Potential Good    PT Frequency 2x / week    PT Duration 6 weeks    PT Treatment/Interventions ADLs/Self Care Home Management;Functional mobility training;Therapeutic activities;Therapeutic exercise;Neuromuscular re-education;Gait training;Stair training;Patient/family education;Manual techniques;Passive range of motion    PT Next Visit Plan continue with balance  training; continue to progress strengthein; review UE and LE HEP to increase intensity, Nu-step endurance  5 intervals of 2 minute at level 3 next    PT Home Exercise Plan [facility]    Consulted and Agree with Plan of Care Patient             Patient will benefit from skilled therapeutic intervention in order to improve the following deficits and impairments:  Abnormal gait, Cardiopulmonary status limiting activity, Decreased mobility, Decreased activity tolerance, Decreased endurance, Decreased strength, Difficulty walking, Decreased range of motion, Improper body mechanics, Postural dysfunction  Visit Diagnosis: Other abnormalities of gait and mobility  Difficulty in walking, not elsewhere classified  Muscle weakness (generalized)     Problem List Patient Active Problem List   Diagnosis Date Noted   CKD (chronic kidney disease) stage 3, GFR 30-59 ml/min ([name]) 05/28/2021   Adjustment disorder 01/22/2021   Chronic back pain 10/22/2020   Peripheral vertigo 05/15/2020   Dermatitis 01/04/2020   Unintentional weight loss with loose stools 05/19/2019   Low vitamin B12 level 03/29/2019   Heme positive stool 03/14/2019   Normocytic anemia 03/14/2019   Chronic diastolic heart failure ([place]) 02/08/2019   Diabetic retinopathy ([place]) 09/08/2018   Physical debility 05/24/2018   Varicose veins of both lower extremities 03/01/2018   Fatigue due to exertion 10/14/2017   GERD (gastroesophageal reflux disease) 08/25/2017   CAD in native artery 04/14/2017   Hyperlipidemia associated with type 2 diabetes mellitus ([place]) 04/14/2017   Diabetic peripheral neuropathy associated with type 2 diabetes mellitus ([place]) 08/17/2016   T2DM (type 2 diabetes mellitus) ([place]) 05/02/2016   Hypertension associated with diabetes ([place]) 05/02/2016    Carney Living, PT 07/18/2021, 1:48 PM  [facility] [address] [place], [place], [address] Phone: [phone]   Fax:  [phone]  Name: Jeffrey [name] MRN: [id number] Date of Birth: May 17, 1956

## 2021-07-19 ENCOUNTER — Other Ambulatory Visit (INDEPENDENT_AMBULATORY_CARE_PROVIDER_SITE_OTHER): Payer: HMO

## 2021-07-19 DIAGNOSIS — R7989 Other specified abnormal findings of blood chemistry: Secondary | ICD-10-CM | POA: Diagnosis not present

## 2021-07-19 LAB — COMPREHENSIVE METABOLIC PANEL
ALT: 19 U/L (ref 0–53)
AST: 19 U/L (ref 0–37)
Albumin: 4.2 g/dL (ref 3.5–5.2)
Alkaline Phosphatase: 62 U/L (ref 39–117)
BUN: 53 mg/dL — ABNORMAL HIGH (ref 6–23)
CO2: 27 mEq/L (ref 19–32)
Calcium: 9.3 mg/dL (ref 8.4–10.5)
Chloride: 98 mEq/L (ref 96–112)
Creatinine, Ser: 2.22 mg/dL — ABNORMAL HIGH (ref 0.40–1.50)
GFR: 30.08 mL/min — ABNORMAL LOW (ref >60.00)
Glucose, Bld: 181 mg/dL — ABNORMAL HIGH (ref 70–99)
Potassium: 4.3 mEq/L (ref 3.5–5.1)
Sodium: 135 mEq/L (ref 135–145)
Total Bilirubin: 0.5 mg/dL (ref 0.2–1.2)
Total Protein: 7 g/dL (ref 6.0–8.3)

## 2021-07-22 ENCOUNTER — Other Ambulatory Visit: Payer: Self-pay

## 2021-07-22 ENCOUNTER — Encounter (HOSPITAL_BASED_OUTPATIENT_CLINIC_OR_DEPARTMENT_OTHER): Payer: Self-pay | Admitting: Physical Therapy

## 2021-07-22 ENCOUNTER — Ambulatory Visit (HOSPITAL_BASED_OUTPATIENT_CLINIC_OR_DEPARTMENT_OTHER): Payer: HMO | Admitting: Physical Therapy

## 2021-07-22 DIAGNOSIS — R2689 Other abnormalities of gait and mobility: Secondary | ICD-10-CM

## 2021-07-22 DIAGNOSIS — R262 Difficulty in walking, not elsewhere classified: Secondary | ICD-10-CM

## 2021-07-22 DIAGNOSIS — M6281 Muscle weakness (generalized): Secondary | ICD-10-CM

## 2021-07-22 NOTE — Progress Notes (Signed)
Please inform patient of the following:  Labs are overall stable. We can discuss more at his office visit tomorrow.  Jeffrey [name]. [name], MD 07/22/2021 12:32 PM

## 2021-07-22 NOTE — Therapy (Signed)
[facility] [address] [place], [place], [address] Phone: [phone]   Fax:  [phone]  Physical Therapy Treatment/progress note    Patient Details  Name: Jeffrey [name] MRN: [id number] Date of Birth: 19-Oct-1955 Referring Provider (PT): Dimas Chyle MD  Progress Note Reporting Period 05/06/2021 to 07/22/2021  See note below for Objective Data and Assessment of Progress/Goals.      Encounter Date: 07/22/2021   PT End of Session - 07/22/21 1538     Visit Number 29    Number of Visits 39    Date for PT Re-Evaluation 08/16/21    Authorization Type HTA 2022 30 dollars progress note perfromed at visit 65. Kx modifier applied    PT Start Time 9024    PT Stop Time 0356    PT Time Calculation (min) 41 min    Activity Tolerance Patient tolerated treatment well    Behavior During Therapy [facility] for tasks assessed/performed             Past Medical History:  Diagnosis Date   Cardiac arrest ([place]) 05/22/2016   Cataract    CHF (congestive heart failure) ([name])    CKD (chronic kidney disease) stage 3, GFR 30-59 ml/min ([name]) 05/28/2021   Diabetes mellitus without complication ([place])    type 2   Diabetic retinopathy ([place])    Hyperlipidemia    Hypertension    Memory loss    mild   Myocardial infarct ([place]) 2105   Retinopathy    Both eyes   S/P primary angioplasty with coronary stent 05/22/2016   Vitamin B12 deficiency    Vitreous hemorrhage of left eye ([place])    proliferative diabetic retinopathy    Past Surgical History:  Procedure Laterality Date   ANGIOPLASTY     2015   COLONOSCOPY     multiple   EYE SURGERY     PARS PLANA VITRECTOMY Left 03/15/2020   Procedure: PARS PLANA VITRECTOMY WITH 25 GAUGE;  Surgeon: [name], MD;  Location: [facility];  Service: Ophthalmology;  Laterality: Left;   PHOTOCOAGULATION WITH LASER Left 03/15/2020   Procedure: PHOTOCOAGULATION WITH LASER; INTRAVITREAL INJECTION OF AVASTIN;   Surgeon: [name], MD;  Location: [place];  Service: Ophthalmology;  Laterality: Left;   REFRACTIVE SURGERY     UPPER GI ENDOSCOPY     several   VITRECTOMY      There were no vitals filed for this visit.   Subjective Assessment - 07/22/21 1536     Subjective Patient reports he was a little sore after the last visit but overall he flet good. He is walking better today.    Pertinent History CAD; MI 2015 ( corrected per patient) ; DMII with peripheral neuropathy    How long can you stand comfortably? depends on the day    How long can you walk comfortably? has walked a 1/4 mile but it took 2 hours    Diagnostic tests lumbar x-ray: recent Facet hypertrophy at L4-5 and L5-S1.  Aortic atherosclerosis    Currently in Pain? No/denies                [facility] PT Assessment - 07/22/21 0001       Assessment   Medical Diagnosis genralized weakness/ Decreased endurance/ general debility    Referring Provider (PT) Dimas Chyle MD      Strength   Right Hip Flexion 3+/5    Right Hip ABduction 4/5    Left Hip Flexion 3+/5  Left Hip ABduction 4/5    Right Knee Flexion 4+/5    Right Knee Extension 4+/5    Left Knee Flexion 4+/5    Left Knee Extension 4+/5      Transfers   Five time sit to stand comments  20 seconds but required walker      Ambulation/Gait   Gait Comments 6 min walk test                    Bicep curl 2x10 10 lbs  Row 3x10 10lbs  Chest press 2x10 10lbs  Nu-step 5 min  Leg press 3x10 40 lbs   Improved ability to get on and off the machines                   PT Education - 07/22/21 1537     Education Details reviewed set-up and progression of weights    Person(s) Educated Patient    Methods Explanation;Demonstration;Verbal cues;Tactile cues    Comprehension Verbalized understanding;Returned demonstration;Verbal cues required;Tactile cues required              PT Short Term Goals - 07/05/21 0753       PT SHORT TERM GOAL #1    Title Patient will perfrom all 5 stands in 14 seconds    Baseline 20 sec    Time 4    Period Weeks    Status On-going    Target Date 06/03/21      PT SHORT TERM GOAL #2   Title Patient will increase bilateral LE strength to 4+/5    Baseline decreased strength compared to last visit    Time 4    Period Weeks    Target Date 06/03/21      PT SHORT TERM GOAL #3   Title Patients will perfrom narrow base of support with SBA    Baseline perfroming on air-ex mat    Time 4    Period Weeks    Status On-going    Target Date 03/08/21      PT SHORT TERM GOAL #4   Title Patient will increase 6 min walk ttest to 600'    Baseline 270    Time 3    Period Weeks    Status On-going    Target Date 07/26/21               PT Long Term Goals - 07/05/21 0755       PT LONG TERM GOAL #1   Title Patient will ambualte 1000' on 6 min walk test in order to improve ability to ambualte int he community    Time 8    Period Weeks    Status On-going      PT LONG TERM GOAL #2   Title Patient will demonstrate a 30 on the BERG balance scale    Time 6    Period Weeks    Status On-going      PT LONG TERM GOAL #3   Title Patient wil be independent with a gym program to improve strength and endurance    Baseline wokring on gym program    Time 6    Period Weeks    Status On-going      PT LONG TERM GOAL #4   Title Pt to demo improved LE strength to 5/5 to improve stability and ability for gait.    Time 6    Period Weeks    Status On-going  Plan - 07/22/21 1548     Clinical Impression Statement Patient is making progreess. He was able to perfrom his sets with less rest this time. He had improved flexion standing and walking today. Overall he is getting back to were he was before he had his issue with medications and swelling. he plans on joing the gym in the next few weeks. We will continue to follow him for a few more visits after he joins. His numbers for his  progress note were taken from his re-cert 2 visits ago. He would benefit from continued therapy to become independnet with a long term gym program.    Personal Factors and Comorbidities Fitness;Past/Current Experience;Comorbidity 3+;Time since onset of injury/illness/exacerbation    Comorbidities DMII with neuropathy, CAD, left knee pain at times    Examination-Activity Limitations Bed Mobility;Carry;Squat;Sit;Stairs;Lift;Locomotion Level;Other    Examination-Participation Restrictions Cleaning;Community Activity;Driving;Laundry;Shop    Stability/Clinical Decision Making Evolving/Moderate complexity    Clinical Decision Making High    Rehab Potential Good    PT Frequency 2x / week    PT Duration 6 weeks    PT Treatment/Interventions ADLs/Self Care Home Management;Functional mobility training;Therapeutic activities;Therapeutic exercise;Neuromuscular re-education;Gait training;Stair training;Patient/family education;Manual techniques;Passive range of motion    PT Next Visit Plan continue with balance training; continue to progress strengthein; review UE and LE HEP to increase intensity, Nu-step endurance 5 intervals of 2 minute at level 3 next    PT Home Exercise Plan [facility]    Consulted and Agree with Plan of Care Patient             Patient will benefit from skilled therapeutic intervention in order to improve the following deficits and impairments:  Abnormal gait, Cardiopulmonary status limiting activity, Decreased mobility, Decreased activity tolerance, Decreased endurance, Decreased strength, Difficulty walking, Decreased range of motion, Improper body mechanics, Postural dysfunction  Visit Diagnosis: Other abnormalities of gait and mobility  Difficulty in walking, not elsewhere classified  Muscle weakness (generalized)     Problem List Patient Active Problem List   Diagnosis Date Noted   CKD (chronic kidney disease) stage 3, GFR 30-59 ml/min ([name]) 05/28/2021   Adjustment  disorder 01/22/2021   Chronic back pain 10/22/2020   Peripheral vertigo 05/15/2020   Dermatitis 01/04/2020   Unintentional weight loss with loose stools 05/19/2019   Low vitamin B12 level 03/29/2019   Heme positive stool 03/14/2019   Normocytic anemia 03/14/2019   Chronic diastolic heart failure ([place]) 02/08/2019   Diabetic retinopathy ([place]) 09/08/2018   Physical debility 05/24/2018   Varicose veins of both lower extremities 03/01/2018   Fatigue due to exertion 10/14/2017   GERD (gastroesophageal reflux disease) 08/25/2017   CAD in native artery 04/14/2017   Hyperlipidemia associated with type 2 diabetes mellitus ([place]) 04/14/2017   Diabetic peripheral neuropathy associated with type 2 diabetes mellitus ([place]) 08/17/2016   T2DM (type 2 diabetes mellitus) ([place]) 05/02/2016   Hypertension associated with diabetes ([place]) 05/02/2016    Carney Living, PT 07/22/2021, 3:53 PM  [facility] [address] [place], [place], [address] Phone: [phone]   Fax:  [phone]  Name: Jeffrey [name] MRN: [id number] Date of Birth: 05-02-1956

## 2021-07-23 ENCOUNTER — Ambulatory Visit (INDEPENDENT_AMBULATORY_CARE_PROVIDER_SITE_OTHER): Payer: HMO | Admitting: Family Medicine

## 2021-07-23 ENCOUNTER — Encounter: Payer: Self-pay | Admitting: Family Medicine

## 2021-07-23 ENCOUNTER — Telehealth: Payer: Self-pay

## 2021-07-23 VITALS — BP 132/78 | HR 72 | Temp 97.6°F | Ht 68.0 in | Wt 161.8 lb

## 2021-07-23 DIAGNOSIS — N1832 Chronic kidney disease, stage 3b: Secondary | ICD-10-CM | POA: Diagnosis not present

## 2021-07-23 DIAGNOSIS — E1159 Type 2 diabetes mellitus with other circulatory complications: Secondary | ICD-10-CM | POA: Diagnosis not present

## 2021-07-23 DIAGNOSIS — E1142 Type 2 diabetes mellitus with diabetic polyneuropathy: Secondary | ICD-10-CM | POA: Diagnosis not present

## 2021-07-23 DIAGNOSIS — Z794 Long term (current) use of insulin: Secondary | ICD-10-CM

## 2021-07-23 DIAGNOSIS — I152 Hypertension secondary to endocrine disorders: Secondary | ICD-10-CM

## 2021-07-23 LAB — POCT GLYCOSYLATED HEMOGLOBIN (HGB A1C): Hemoglobin A1C: 7.6 % — AB (ref 4.0–5.6)

## 2021-07-23 MED ORDER — METFORMIN HCL ER 500 MG PO TB24: 1000.0000 mg | ORAL_TABLET | Freq: Every day | ORAL | 0 refills | Status: DC

## 2021-07-23 NOTE — Telephone Encounter (Signed)
Patient called in wanting to know the results of the A1c test that was done, states it was not documented in mychart or in the notes that it was done but they state I was done and would like a call back ASAP.

## 2021-07-23 NOTE — Assessment & Plan Note (Signed)
Patient with CKD stage III since being hospitalized with COVID in early 2021.  His creatinine has ranged from 1.5-2 over the last couple of years.  It was stable on recent metabolic panel.  He will come back in a month or so and we can recheck.  We will continue his current regimen.  May consider addition of ACE or ARB in the near future depending on blood pressure control.

## 2021-07-23 NOTE — Assessment & Plan Note (Signed)
Blood pressure is at goal today.  Unfortunately he did not do well off of spironolactone a couple of months ago but has been doing well since then with his current regimen of Coreg 3.125 mg twice daily and spironolactone 25 mg daily.  We checked his BMET earlier this week which showed stable renal function with GFR slightly above 30.  He would probably benefit from starting ACE or ARB at some point in the near future though we will continue current regimen for now.

## 2021-07-23 NOTE — Progress Notes (Signed)
   Jeffrey [name] is a 65 y.o. male who presents today for an office visit.  Assessment/Plan:  Chronic Problems Addressed Today: Hypertension associated with diabetes ([place]) Blood pressure is at goal today.  Unfortunately he did not do well off of spironolactone a couple of months ago but has been doing well since then with his current regimen of Coreg 3.125 mg twice daily and spironolactone 25 mg daily.  We checked his BMET earlier this week which showed stable renal function with GFR slightly above 30.  He would probably benefit from starting ACE or ARB at some point in the near future though we will continue current regimen for now.  [name] A1c 7.6.  GFR is stable though near the cutoff for stopping metformin.  We will decrease dose to 1000 mg total daily.  He will continue current dose of insulin with Lantus 30 units daily.  He will come back in about a month or so and we will hopefully be able to start [place] at that time.  Patient states that insurance will cover it starting in the year.  We will recheck BMET next month as well.  CKD (chronic kidney disease) stage 3, GFR 30-59 ml/min ([name]) Patient with CKD stage III since being hospitalized with COVID in early 2021.  His creatinine has ranged from 1.5-2 over the last couple of years.  It was stable on recent metabolic panel.  He will come back in a month or so and we can recheck.  We will continue his current regimen.  May consider addition of ACE or ARB in the near future depending on blood pressure control.     Subjective:  HPI:  See A/P for status of chronic conditions.   He is doing well since her last visit 3 months ago.  He has been working with physical therapy.  It seems to be going well.  His blood sugars have been relatively well controlled.  He does have occasional highs and lows but this is managed with NovoLog.  He will frequently skip his evening dose of metformin.  He is on Lantus 30 units  daily and NovoLog as needed.  He is also doing well with his hypertension regimen of Coreg 3.125 mg twice daily and spironolactone 25 mg daily.         Objective:  Physical Exam: BP 132/78 (BP Location: Right Arm)   Pulse 72   Temp 97.6 F (36.4 C) (Temporal)   Ht 5\' 8"  (1.727 m)   Wt 161 lb 12.8 oz (73.4 kg)   SpO2 99%   BMI 24.60 kg/m   Gen: No acute distress, resting comfortably Neuro: Grossly normal, moves all extremities Psych: Normal affect and thought content      Time Spent: 45 minutes of total time was spent on the date of the encounter performing the following actions: chart review prior to seeing the patient, obtaining history, performing a medically necessary exam, counseling on the treatment plan including decreasing dose of metformin, placing orders, and documenting in our EHR.   [name]. [name], MD 07/23/2021 12:22 PM

## 2021-07-23 NOTE — Patient Instructions (Addendum)
It was very nice to see you today!  Please decrease your metformin to 1000mg  daily.  No other changes today.  Please come back next month for your annual physical.  Come back sooner if needed.   Take care, Dr [name]  PLEASE NOTE:  If you had any lab tests please let us know if you have not heard back within a few days. You may see your results on mychart before we have a chance to review them but we will give you a call once they are reviewed by [place]. If we ordered any referrals today, please let us know if you have not heard from their office within the next week.   Please try these tips to maintain a healthy lifestyle:  Eat at least 3 REAL meals and 1-2 snacks per day.  Aim for no more than 5 hours between eating.  If you eat breakfast, please do so within one hour of getting up.   Each meal should contain half fruits/vegetables, one quarter protein, and one quarter carbs (no bigger than a computer mouse)  Cut down on sweet beverages. This includes juice, soda, and sweet tea.   Drink at least 1 glass of water with each meal and aim for at least 8 glasses per day  Exercise at least 150 minutes every week.

## 2021-07-23 NOTE — Assessment & Plan Note (Signed)
A1c 7.6.  GFR is stable though near the cutoff for stopping metformin.  We will decrease dose to 1000 mg total daily.  He will continue current dose of insulin with Lantus 30 units daily.  He will come back in about a month or so and we will hopefully be able to start [place] at that time.  Patient states that insurance will cover it starting in the year.  We will recheck BMET next month as well.

## 2021-07-23 NOTE — Telephone Encounter (Signed)
Spoke with patient A1C results given to patient  Verbalized understanding

## 2021-07-26 ENCOUNTER — Other Ambulatory Visit: Payer: Self-pay

## 2021-07-26 ENCOUNTER — Ambulatory Visit (HOSPITAL_BASED_OUTPATIENT_CLINIC_OR_DEPARTMENT_OTHER): Payer: HMO | Admitting: Physical Therapy

## 2021-07-26 DIAGNOSIS — R2689 Other abnormalities of gait and mobility: Secondary | ICD-10-CM

## 2021-07-26 DIAGNOSIS — M6281 Muscle weakness (generalized): Secondary | ICD-10-CM

## 2021-07-26 DIAGNOSIS — R262 Difficulty in walking, not elsewhere classified: Secondary | ICD-10-CM

## 2021-07-29 ENCOUNTER — Encounter (HOSPITAL_BASED_OUTPATIENT_CLINIC_OR_DEPARTMENT_OTHER): Payer: Self-pay | Admitting: Physical Therapy

## 2021-07-29 ENCOUNTER — Ambulatory Visit (HOSPITAL_BASED_OUTPATIENT_CLINIC_OR_DEPARTMENT_OTHER): Payer: HMO | Admitting: Physical Therapy

## 2021-07-29 ENCOUNTER — Other Ambulatory Visit: Payer: Self-pay

## 2021-07-29 DIAGNOSIS — R2689 Other abnormalities of gait and mobility: Secondary | ICD-10-CM | POA: Diagnosis not present

## 2021-07-29 DIAGNOSIS — M6281 Muscle weakness (generalized): Secondary | ICD-10-CM

## 2021-07-29 DIAGNOSIS — R262 Difficulty in walking, not elsewhere classified: Secondary | ICD-10-CM

## 2021-07-29 NOTE — Therapy (Signed)
[facility] [address] [place], [place], [address] Phone: [phone]   Fax:  [phone]  Physical Therapy Treatment  Patient Details  Name: Jeffrey [name] MRN: [id number] Date of Birth: 1956-08-09 Referring Provider (PT): Dimas Chyle MD   Encounter Date: 07/29/2021    Past Medical History:  Diagnosis Date   Cardiac arrest [facility]) 05/22/2016   Cataract    CHF (congestive heart failure) ([place])    CKD (chronic kidney disease) stage 3, GFR 30-59 ml/min ([place]) 05/28/2021   Diabetes mellitus without complication ([place])    type 2   Diabetic retinopathy ([place])    Hyperlipidemia    Hypertension    Memory loss    mild   Myocardial infarct ([place]) 2105   Retinopathy    Both eyes   S/P primary angioplasty with coronary stent 05/22/2016   Vitamin B12 deficiency    Vitreous hemorrhage of left eye ([place])    proliferative diabetic retinopathy    Past Surgical History:  Procedure Laterality Date   ANGIOPLASTY     2015   COLONOSCOPY     multiple   EYE SURGERY     PARS PLANA VITRECTOMY Left 03/15/2020   Procedure: PARS PLANA VITRECTOMY WITH 25 GAUGE;  Surgeon: [name], MD;  Location: [facility];  Service: Ophthalmology;  Laterality: Left;   PHOTOCOAGULATION WITH LASER Left 03/15/2020   Procedure: PHOTOCOAGULATION WITH LASER; INTRAVITREAL INJECTION OF AVASTIN;  Surgeon: [name], MD;  Location: [facility];  Service: Ophthalmology;  Laterality: Left;   REFRACTIVE SURGERY     UPPER GI ENDOSCOPY     several   VITRECTOMY      There were no vitals filed for this visit.   Subjective Assessment - 07/29/21 1325     Pertinent History CAD; MI 2015 ( corrected per patient) ; DMII with peripheral neuropathy    How long can you stand comfortably? depends on the day    How long can you walk comfortably? has walked a 1/4 mile but it took 2 hours    Diagnostic tests lumbar x-ray: recent Facet hypertrophy at L4-5 and L5-S1.  Aortic  atherosclerosis    Currently in Pain? No/denies                           Hip abdcution machine 3x15 40 lbs   Knee extension 3x15 25 lbs  Hamstring flexion 3x15 10 lbs   Nu-step 6 min L3  Hip adduction 3x10 10 lbs                 PT Short Term Goals - 07/05/21 0753       PT SHORT TERM GOAL #1   Title Patient will perfrom all 5 stands in 14 seconds    Baseline 20 sec    Time 4    Period Weeks    Status On-going    Target Date 06/03/21      PT SHORT TERM GOAL #2   Title Patient will increase bilateral LE strength to 4+/5    Baseline decreased strength compared to last visit    Time 4    Period Weeks    Target Date 06/03/21      PT SHORT TERM GOAL #3   Title Patients will perfrom narrow base of support with SBA    Baseline perfroming on air-ex mat    Time 4    Period Weeks    Status On-going    Target  Date 03/08/21      PT SHORT TERM GOAL #4   Title Patient will increase 6 min walk ttest to 600'    Baseline 270    Time 3    Period Weeks    Status On-going    Target Date 07/26/21               PT Long Term Goals - 07/05/21 0755       PT LONG TERM GOAL #1   Title Patient will ambualte 1000' on 6 min walk test in order to improve ability to ambualte int he community    Time 8    Period Weeks    Status On-going      PT LONG TERM GOAL #2   Title Patient will demonstrate a 30 on the BERG balance scale    Time 6    Period Weeks    Status On-going      PT LONG TERM GOAL #3   Title Patient wil be independent with a gym program to improve strength and endurance    Baseline wokring on gym program    Time 6    Period Weeks    Status On-going      PT LONG TERM GOAL #4   Title Pt to demo improved LE strength to 5/5 to improve stability and ability for gait.    Time 6    Period Weeks    Status On-going                    Patient will benefit from skilled therapeutic intervention in order to improve the following  deficits and impairments:     Visit Diagnosis: No diagnosis found.     Problem List Patient Active Problem List   Diagnosis Date Noted   CKD (chronic kidney disease) stage 3, GFR 30-59 ml/min ([name]) 05/28/2021   Adjustment disorder 01/22/2021   Chronic back pain 10/22/2020   Peripheral vertigo 05/15/2020   Dermatitis 01/04/2020   Unintentional weight loss with loose stools 05/19/2019   Low vitamin B12 level 03/29/2019   Heme positive stool 03/14/2019   Normocytic anemia 03/14/2019   Chronic diastolic heart failure ([place]) 02/08/2019   Diabetic retinopathy ([place]) 09/08/2018   Physical debility 05/24/2018   Varicose veins of both lower extremities 03/01/2018   Fatigue due to exertion 10/14/2017   GERD (gastroesophageal reflux disease) 08/25/2017   CAD in native artery 04/14/2017   Hyperlipidemia associated with type 2 diabetes mellitus ([place]) 04/14/2017   Diabetic peripheral neuropathy associated with type 2 diabetes mellitus ([place]) 08/17/2016   T2DM (type 2 diabetes mellitus) ([place]) 05/02/2016   Hypertension associated with diabetes ([place]) 05/02/2016    Carney Living, PT 07/29/2021, 1:28 PM  [facility] [address] [place], [place], [address] Phone: [phone]   Fax:  [phone]  Name: Jeffrey [name] MRN: [id number] Date of Birth: 22-Feb-1956

## 2021-07-29 NOTE — Therapy (Signed)
[place], [place], [address] Phone: [phone]   Fax:  [phone]  Physical Therapy Treatment  Patient Details  Name: Jeffrey [name] MRN: [id number] Date of Birth: September 29, 1955 Referring Provider (PT): Dimas Chyle MD   Encounter Date: 07/26/2021   PT End of Session - 07/29/21 0820     Visit Number 30    Number of Visits 39    Date for PT Re-Evaluation 08/16/21    Authorization Type HTA 2022 30 dollars progress note perfromed at visit 75. Kx modifier applied    PT Start Time 3235    PT Stop Time 1428    PT Time Calculation (min) 43 min    Activity Tolerance Patient tolerated treatment well    Behavior During Therapy WFL for tasks assessed/performed             Past Medical History:  Diagnosis Date   Cardiac arrest ([place]) 05/22/2016   Cataract    CHF (congestive heart failure) ([name])    CKD (chronic kidney disease) stage 3, GFR 30-59 ml/min ([name]) 05/28/2021   Diabetes mellitus without complication ([place])    type 2   Diabetic retinopathy ([place])    Hyperlipidemia    Hypertension    Memory loss    mild   Myocardial infarct ([place]) 2105   Retinopathy    Both eyes   S/P primary angioplasty with coronary stent 05/22/2016   Vitamin B12 deficiency    Vitreous hemorrhage of left eye ([place])    proliferative diabetic retinopathy    Past Surgical History:  Procedure Laterality Date   ANGIOPLASTY     2015   COLONOSCOPY     multiple   EYE SURGERY     PARS PLANA VITRECTOMY Left 03/15/2020   Procedure: PARS PLANA VITRECTOMY WITH 25 GAUGE;  Surgeon: [name], MD;  Location: [facility];  Service: Ophthalmology;  Laterality: Left;   PHOTOCOAGULATION WITH LASER Left 03/15/2020   Procedure: PHOTOCOAGULATION WITH LASER; INTRAVITREAL INJECTION OF AVASTIN;  Surgeon: [name], MD;  Location: [facility];  Service: Ophthalmology;  Laterality: Left;   REFRACTIVE SURGERY     UPPER GI ENDOSCOPY     several    VITRECTOMY      There were no vitals filed for this visit.   Subjective Assessment - 07/29/21 0817     Subjective Patient reports he is doing OK today. He is motivated to do more walking today. He is using his stand up walker today.    Pertinent History CAD; MI 2015 ( corrected per patient) ; DMII with peripheral neuropathy    How long can you stand comfortably? depends on the day    How long can you walk comfortably? has walked a 1/4 mile but it took 2 hours    Diagnostic tests lumbar x-ray: recent Facet hypertrophy at L4-5 and L5-S1.  Aortic atherosclerosis    Currently in Pain? No/denies                      Gait training: 400' with 1 seated rest break of 20 seconds; self cuing to pick feet up and stay straight. Slow but steady gait pattern otherwise  Hip abdcution machine 3x15 40 lbs   Knee extension 3x15 30 lbs  Leg press 3x10 40 lbs  Nu-step 6 min L3                       PT Education - 07/29/21 0820  Education Details reviewed HEP and symptom mangement    Person(s) Educated Patient    Methods Explanation;Demonstration;Tactile cues;Verbal cues    Comprehension Verbalized understanding;Returned demonstration;Verbal cues required;Tactile cues required              PT Short Term Goals - 07/05/21 0753       PT SHORT TERM GOAL #1   Title Patient will perfrom all 5 stands in 14 seconds    Baseline 20 sec    Time 4    Period Weeks    Status On-going    Target Date 06/03/21      PT SHORT TERM GOAL #2   Title Patient will increase bilateral LE strength to 4+/5    Baseline decreased strength compared to last visit    Time 4    Period Weeks    Target Date 06/03/21      PT SHORT TERM GOAL #3   Title Patients will perfrom narrow base of support with SBA    Baseline perfroming on air-ex mat    Time 4    Period Weeks    Status On-going    Target Date 03/08/21      PT SHORT TERM GOAL #4   Title Patient will increase 6 min walk  ttest to 600'    Baseline 270    Time 3    Period Weeks    Status On-going    Target Date 07/26/21               PT Long Term Goals - 07/05/21 0755       PT LONG TERM GOAL #1   Title Patient will ambualte 1000' on 6 min walk test in order to improve ability to ambualte int he community    Time 8    Period Weeks    Status On-going      PT LONG TERM GOAL #2   Title Patient will demonstrate a 30 on the BERG balance scale    Time 6    Period Weeks    Status On-going      PT LONG TERM GOAL #3   Title Patient wil be independent with a gym program to improve strength and endurance    Baseline wokring on gym program    Time 6    Period Weeks    Status On-going      PT LONG TERM GOAL #4   Title Pt to demo improved LE strength to 5/5 to improve stability and ability for gait.    Time 6    Period Weeks    Status On-going                   Plan - 07/29/21 8786     Clinical Impression Statement Patient was able to walk today. When he tells himself to pick his feet up and stand straight he does better with his ambualtion. We reviewed exercise machines that will work his legs. He plans on joing the gym on Monday,    Personal Factors and Comorbidities Fitness;Past/Current Experience;Comorbidity 3+;Time since onset of injury/illness/exacerbation    Comorbidities DMII with neuropathy, CAD, left knee pain at times    Examination-Activity Limitations Bed Mobility;Carry;Squat;Sit;Stairs;Lift;Locomotion Level;Other    Examination-Participation Restrictions Cleaning;Community Activity;Driving;Laundry;Shop    Stability/Clinical Decision Making Evolving/Moderate complexity    Clinical Decision Making High    Rehab Potential Good    PT Frequency 2x / week    PT Duration 6 weeks    PT Treatment/Interventions  ADLs/Self Care Home Management;Functional mobility training;Therapeutic activities;Therapeutic exercise;Neuromuscular re-education;Gait training;Stair  training;Patient/family education;Manual techniques;Passive range of motion    PT Next Visit Plan continue with balance training; continue to progress strengthein; review UE and LE HEP to increase intensity, Nu-step endurance 5 intervals of 2 minute at level 3 next    PT Home Exercise Plan [facility]    Consulted and Agree with Plan of Care Patient             Patient will benefit from skilled therapeutic intervention in order to improve the following deficits and impairments:  Abnormal gait, Cardiopulmonary status limiting activity, Decreased mobility, Decreased activity tolerance, Decreased endurance, Decreased strength, Difficulty walking, Decreased range of motion, Improper body mechanics, Postural dysfunction  Visit Diagnosis: Other abnormalities of gait and mobility  Difficulty in walking, not elsewhere classified  Muscle weakness (generalized)     Problem List Patient Active Problem List   Diagnosis Date Noted   CKD (chronic kidney disease) stage 3, GFR 30-59 ml/min ([name]) 05/28/2021   Adjustment disorder 01/22/2021   Chronic back pain 10/22/2020   Peripheral vertigo 05/15/2020   Dermatitis 01/04/2020   Unintentional weight loss with loose stools 05/19/2019   Low vitamin B12 level 03/29/2019   Heme positive stool 03/14/2019   Normocytic anemia 03/14/2019   Chronic diastolic heart failure ([place]) 02/08/2019   Diabetic retinopathy ([place]) 09/08/2018   Physical debility 05/24/2018   Varicose veins of both lower extremities 03/01/2018   Fatigue due to exertion 10/14/2017   GERD (gastroesophageal reflux disease) 08/25/2017   CAD in native artery 04/14/2017   Hyperlipidemia associated with type 2 diabetes mellitus ([place]) 04/14/2017   Diabetic peripheral neuropathy associated with type 2 diabetes mellitus ([place]) 08/17/2016   T2DM (type 2 diabetes mellitus) ([place]) 05/02/2016   Hypertension associated with diabetes ([place]) 05/02/2016    Carney Living, PT DPT  07/29/2021, 8:29  AM  [facility] [address] [place], [place], [address] Phone: [phone]   Fax:  [phone]  Name: Maleak Brazzel MRN: [id number] Date of Birth: Apr 28, 1956

## 2021-07-31 ENCOUNTER — Other Ambulatory Visit: Payer: Self-pay

## 2021-07-31 ENCOUNTER — Ambulatory Visit (HOSPITAL_BASED_OUTPATIENT_CLINIC_OR_DEPARTMENT_OTHER): Payer: HMO | Admitting: Physical Therapy

## 2021-07-31 DIAGNOSIS — R2689 Other abnormalities of gait and mobility: Secondary | ICD-10-CM | POA: Diagnosis not present

## 2021-07-31 DIAGNOSIS — M6281 Muscle weakness (generalized): Secondary | ICD-10-CM

## 2021-07-31 DIAGNOSIS — R262 Difficulty in walking, not elsewhere classified: Secondary | ICD-10-CM

## 2021-08-01 ENCOUNTER — Encounter (HOSPITAL_BASED_OUTPATIENT_CLINIC_OR_DEPARTMENT_OTHER): Payer: Self-pay | Admitting: Physical Therapy

## 2021-08-01 NOTE — Therapy (Signed)
[place], [place], [address] Phone: [phone]   Fax:  [phone]  Physical Therapy Treatment  Patient Details  Name: Jeffrey [name] MRN: [id number] Date of Birth: 01-15-1956 Referring Provider (PT): Dimas Chyle MD   Encounter Date: 07/31/2021   PT End of Session - 08/01/21 0959     Visit Number 32    Number of Visits 39    Date for PT Re-Evaluation 08/16/21    Authorization Type HTA 2022 30 dollars progress note perfromed at visit 31. Kx modifier applied    PT Start Time 8127    PT Stop Time 1425    PT Time Calculation (min) 40 min    Activity Tolerance Patient tolerated treatment well    Behavior During Therapy WFL for tasks assessed/performed             Past Medical History:  Diagnosis Date   Cardiac arrest ([place]) 05/22/2016   Cataract    CHF (congestive heart failure) ([name])    CKD (chronic kidney disease) stage 3, GFR 30-59 ml/min ([name]) 05/28/2021   Diabetes mellitus without complication ([place])    type 2   Diabetic retinopathy ([place])    Hyperlipidemia    Hypertension    Memory loss    mild   Myocardial infarct ([place]) 2105   Retinopathy    Both eyes   S/P primary angioplasty with coronary stent 05/22/2016   Vitamin B12 deficiency    Vitreous hemorrhage of left eye ([place])    proliferative diabetic retinopathy    Past Surgical History:  Procedure Laterality Date   ANGIOPLASTY     2015   COLONOSCOPY     multiple   EYE SURGERY     PARS PLANA VITRECTOMY Left 03/15/2020   Procedure: PARS PLANA VITRECTOMY WITH 25 GAUGE;  Surgeon: [name], MD;  Location: [facility];  Service: Ophthalmology;  Laterality: Left;   PHOTOCOAGULATION WITH LASER Left 03/15/2020   Procedure: PHOTOCOAGULATION WITH LASER; INTRAVITREAL INJECTION OF AVASTIN;  Surgeon: [name], MD;  Location: [facility];  Service: Ophthalmology;  Laterality: Left;   REFRACTIVE SURGERY     UPPER GI ENDOSCOPY     several    VITRECTOMY      There were no vitals filed for this visit.   Subjective Assessment - 08/01/21 0956     Subjective Patient reports he felt good after Monday.    Patient is accompained by: Family member    Pertinent History CAD; MI 2015 ( corrected per patient) ; DMII with peripheral neuropathy    How long can you stand comfortably? depends on the day    How long can you walk comfortably? has walked a 1/4 mile but it took 2 hours    Diagnostic tests lumbar x-ray: recent Facet hypertrophy at L4-5 and L5-S1.  Aortic atherosclerosis    Currently in Pain? No/denies                Hip abdcution machine 3x15 40 lbs   Knee extension 3x15 25 lbs  Hamstring flexion 3x15 10 lbs   Nu-step 6 min L3  Hip abduction 3x10 10 lbs  Triceps extension 3x10 10 lbs                               PT Short Term Goals - 07/05/21 0753       PT SHORT TERM GOAL #1   Title Patient will perfrom  all 5 stands in 14 seconds    Baseline 20 sec    Time 4    Period Weeks    Status On-going    Target Date 06/03/21      PT SHORT TERM GOAL #2   Title Patient will increase bilateral LE strength to 4+/5    Baseline decreased strength compared to last visit    Time 4    Period Weeks    Target Date 06/03/21      PT SHORT TERM GOAL #3   Title Patients will perfrom narrow base of support with SBA    Baseline perfroming on air-ex mat    Time 4    Period Weeks    Status On-going    Target Date 03/08/21      PT SHORT TERM GOAL #4   Title Patient will increase 6 min walk ttest to 600'    Baseline 270    Time 3    Period Weeks    Status On-going    Target Date 07/26/21               PT Long Term Goals - 07/05/21 0755       PT LONG TERM GOAL #1   Title Patient will ambualte 1000' on 6 min walk test in order to improve ability to ambualte int he community    Time 8    Period Weeks    Status On-going      PT LONG TERM GOAL #2   Title Patient will demonstrate a 30 on  the BERG balance scale    Time 6    Period Weeks    Status On-going      PT LONG TERM GOAL #3   Title Patient wil be independent with a gym program to improve strength and endurance    Baseline wokring on gym program    Time 6    Period Weeks    Status On-going      PT LONG TERM GOAL #4   Title Pt to demo improved LE strength to 5/5 to improve stability and ability for gait.    Time 6    Period Weeks    Status On-going                   Plan - 08/01/21 1344     Clinical Impression Statement Patient continues to tolerate treatment well. He was able to do an extra machine today. He reported fatigue. He is doing better getting on and off the machines. They will be joining the gym within the next few weeks.    Personal Factors and Comorbidities Fitness;Past/Current Experience;Comorbidity 3+;Time since onset of injury/illness/exacerbation    Comorbidities DMII with neuropathy, CAD, left knee pain at times    Examination-Activity Limitations Bed Mobility;Carry;Squat;Sit;Stairs;Lift;Locomotion Level;Other    Examination-Participation Restrictions Cleaning;Community Activity;Driving;Laundry;Shop    Stability/Clinical Decision Making Evolving/Moderate complexity    Clinical Decision Making High    Rehab Potential Good    PT Frequency 2x / week    PT Duration 6 weeks    PT Treatment/Interventions ADLs/Self Care Home Management;Functional mobility training;Therapeutic activities;Therapeutic exercise;Neuromuscular re-education;Gait training;Stair training;Patient/family education;Manual techniques;Passive range of motion    PT Next Visit Plan continue with balance training; continue to progress strengthein; review UE and LE HEP to increase intensity, Nu-step endurance 5 intervals of 2 minute at level 3 next    PT Home Exercise Plan [facility]    Consulted and Agree with Plan of Care Patient  Patient will benefit from skilled therapeutic intervention in order to  improve the following deficits and impairments:  Abnormal gait, Cardiopulmonary status limiting activity, Decreased mobility, Decreased activity tolerance, Decreased endurance, Decreased strength, Difficulty walking, Decreased range of motion, Improper body mechanics, Postural dysfunction  Visit Diagnosis: Other abnormalities of gait and mobility  Difficulty in walking, not elsewhere classified  Muscle weakness (generalized)     Problem List Patient Active Problem List   Diagnosis Date Noted   CKD (chronic kidney disease) stage 3, GFR 30-59 ml/min ([name]) 05/28/2021   Adjustment disorder 01/22/2021   Chronic back pain 10/22/2020   Peripheral vertigo 05/15/2020   Dermatitis 01/04/2020   Unintentional weight loss with loose stools 05/19/2019   Low vitamin B12 level 03/29/2019   Heme positive stool 03/14/2019   Normocytic anemia 03/14/2019   Chronic diastolic heart failure ([place]) 02/08/2019   Diabetic retinopathy ([place]) 09/08/2018   Physical debility 05/24/2018   Varicose veins of both lower extremities 03/01/2018   Fatigue due to exertion 10/14/2017   GERD (gastroesophageal reflux disease) 08/25/2017   CAD in native artery 04/14/2017   Hyperlipidemia associated with type 2 diabetes mellitus ([place]) 04/14/2017   Diabetic peripheral neuropathy associated with type 2 diabetes mellitus ([place]) 08/17/2016   T2DM (type 2 diabetes mellitus) ([place]) 05/02/2016   Hypertension associated with diabetes ([place]) 05/02/2016    Carney Living, PT 08/01/2021, 1:50 PM  [facility] [address] [place], [place], [address] Phone: [phone]   Fax:  [phone]  Name: Jeffrey [name] MRN: [id number] Date of Birth: 1956/07/24

## 2021-08-09 ENCOUNTER — Other Ambulatory Visit: Payer: Self-pay

## 2021-08-09 ENCOUNTER — Encounter (HOSPITAL_BASED_OUTPATIENT_CLINIC_OR_DEPARTMENT_OTHER): Payer: Self-pay | Admitting: Physical Therapy

## 2021-08-09 ENCOUNTER — Ambulatory Visit (HOSPITAL_BASED_OUTPATIENT_CLINIC_OR_DEPARTMENT_OTHER): Payer: HMO | Admitting: Physical Therapy

## 2021-08-09 DIAGNOSIS — M6281 Muscle weakness (generalized): Secondary | ICD-10-CM

## 2021-08-09 DIAGNOSIS — R2689 Other abnormalities of gait and mobility: Secondary | ICD-10-CM | POA: Diagnosis not present

## 2021-08-09 DIAGNOSIS — R262 Difficulty in walking, not elsewhere classified: Secondary | ICD-10-CM

## 2021-08-09 NOTE — Therapy (Addendum)
[facility] [place], [place], [address] Phone: [phone]   Fax:  [phone]  Physical Therapy Treatment/Discharge   Patient Details  Name: Marlene Pfluger MRN: [id number] Date of Birth: 1955/09/07 Referring Provider (PT): Dimas Chyle MD   Encounter Date: 08/09/2021   PT End of Session - 08/09/21 1329     Visit Number 33    Number of Visits 39    Date for PT Re-Evaluation 08/16/21    Authorization Type HTA 2022 30 dollars progress note perfromed at visit 36. Kx modifier applied    PT Start Time 1300    PT Stop Time 1340    PT Time Calculation (min) 40 min    Activity Tolerance Patient tolerated treatment well    Behavior During Therapy WFL for tasks assessed/performed             Past Medical History:  Diagnosis Date   Cardiac arrest ([place]) 05/22/2016   Cataract    CHF (congestive heart failure) ([name])    CKD (chronic kidney disease) stage 3, GFR 30-59 ml/min ([name]) 05/28/2021   Diabetes mellitus without complication ([place])    type 2   Diabetic retinopathy ([place])    Hyperlipidemia    Hypertension    Memory loss    mild   Myocardial infarct ([place]) 2105   Retinopathy    Both eyes   S/P primary angioplasty with coronary stent 05/22/2016   Vitamin B12 deficiency    Vitreous hemorrhage of left eye ([place])    proliferative diabetic retinopathy    Past Surgical History:  Procedure Laterality Date   ANGIOPLASTY     2015   COLONOSCOPY     multiple   EYE SURGERY     PARS PLANA VITRECTOMY Left 03/15/2020   Procedure: PARS PLANA VITRECTOMY WITH 25 GAUGE;  Surgeon: [name], MD;  Location: [facility];  Service: Ophthalmology;  Laterality: Left;   PHOTOCOAGULATION WITH LASER Left 03/15/2020   Procedure: PHOTOCOAGULATION WITH LASER; INTRAVITREAL INJECTION OF AVASTIN;  Surgeon: [name], MD;  Location: [facility];  Service: Ophthalmology;  Laterality: Left;   REFRACTIVE SURGERY     UPPER GI ENDOSCOPY      several   VITRECTOMY      There were no vitals filed for this visit.   Subjective Assessment - 08/09/21 1336     Subjective Patient hasn't done much becuase it has been cold. He reports he hasn't moved much.    Pertinent History CAD; MI 2015 ( corrected per patient) ; DMII with peripheral neuropathy    How long can you stand comfortably? depends on the day    How long can you walk comfortably? has walked a 1/4 mile but it took 2 hours    Diagnostic tests lumbar x-ray: recent Facet hypertrophy at L4-5 and L5-S1.  Aortic atherosclerosis    Currently in Pain? No/denies                          Hip abdcution machine 3x15 40 lbs   Knee extension 3x15 25 lbs  Hamstring flexion 3x20 10 lbs   Nu-step 6 min L3  Hip abduction 3x10 10 lbs                  PT Education - 08/09/21 1333     Education Details reviewed set up of equipment; reviewed progression of activity. Given HEP; improtance of using HEP when he cant go outside  Person(s) Educated Patient    Methods Explanation;Demonstration;Tactile cues;Verbal cues    Comprehension Verbalized understanding;Returned demonstration;Verbal cues required;Tactile cues required              PT Short Term Goals - 07/05/21 0753       PT SHORT TERM GOAL #1   Title Patient will perfrom all 5 stands in 14 seconds    Baseline 20 sec    Time 4    Period Weeks    Status On-going    Target Date 06/03/21      PT SHORT TERM GOAL #2   Title Patient will increase bilateral LE strength to 4+/5    Baseline decreased strength compared to last visit    Time 4    Period Weeks    Target Date 06/03/21      PT SHORT TERM GOAL #3   Title Patients will perfrom narrow base of support with SBA    Baseline perfroming on air-ex mat    Time 4    Period Weeks    Status On-going    Target Date 03/08/21      PT SHORT TERM GOAL #4   Title Patient will increase 6 min walk ttest to 600'    Baseline 270    Time 3    Period  Weeks    Status On-going    Target Date 07/26/21               PT Long Term Goals - 07/05/21 0755       PT LONG TERM GOAL #1   Title Patient will ambualte 1000' on 6 min walk test in order to improve ability to ambualte int he community    Time 8    Period Weeks    Status On-going      PT LONG TERM GOAL #2   Title Patient will demonstrate a 30 on the BERG balance scale    Time 6    Period Weeks    Status On-going      PT LONG TERM GOAL #3   Title Patient wil be independent with a gym program to improve strength and endurance    Baseline wokring on gym program    Time 6    Period Weeks    Status On-going      PT LONG TERM GOAL #4   Title Pt to demo improved LE strength to 5/5 to improve stability and ability for gait.    Time 6    Period Weeks    Status On-going                   Plan - 08/09/21 1337     Clinical Impression Statement Patient has reached max potential at this time. He has been able to do machines with min a to get on and off. He continues to require cuing to get closer to the machines with his walker. he tends to park his walker away from the machine then try to walk to the machine with no assist. He plans on joing the gym. He will likley D/C to HEP at this time.    Personal Factors and Comorbidities Fitness;Past/Current Experience;Comorbidity 3+;Time since onset of injury/illness/exacerbation    Comorbidities DMII with neuropathy, CAD, left knee pain at times    Examination-Activity Limitations Bed Mobility;Carry;Squat;Sit;Stairs;Lift;Locomotion Level;Other    Stability/Clinical Decision Making Evolving/Moderate complexity    Clinical Decision Making High    Rehab Potential Good    PT Frequency 2x /  week    PT Duration 6 weeks    PT Treatment/Interventions ADLs/Self Care Home Management;Functional mobility training;Therapeutic activities;Therapeutic exercise;Neuromuscular re-education;Gait training;Stair training;Patient/family  education;Manual techniques;Passive range of motion    PT Next Visit Plan continue with balance training; continue to progress strengthein; review UE and LE HEP to increase intensity, Nu-step endurance 5 intervals of 2 minute at level 3 next    PT Home Exercise Plan [facility]    Consulted and Agree with Plan of Care Patient             Patient will benefit from skilled therapeutic intervention in order to improve the following deficits and impairments:  Abnormal gait, Cardiopulmonary status limiting activity, Decreased mobility, Decreased activity tolerance, Decreased endurance, Decreased strength, Difficulty walking, Decreased range of motion, Improper body mechanics, Postural dysfunction  Visit Diagnosis: Other abnormalities of gait and mobility  Difficulty in walking, not elsewhere classified  Muscle weakness (generalized)     Problem List Patient Active Problem List   Diagnosis Date Noted   CKD (chronic kidney disease) stage 3, GFR 30-59 ml/min ([name]) 05/28/2021   Adjustment disorder 01/22/2021   Chronic back pain 10/22/2020   Peripheral vertigo 05/15/2020   Dermatitis 01/04/2020   Unintentional weight loss with loose stools 05/19/2019   Low vitamin B12 level 03/29/2019   Heme positive stool 03/14/2019   Normocytic anemia 03/14/2019   Chronic diastolic heart failure ([place]) 02/08/2019   Diabetic retinopathy ([place]) 09/08/2018   Physical debility 05/24/2018   Varicose veins of both lower extremities 03/01/2018   Fatigue due to exertion 10/14/2017   GERD (gastroesophageal reflux disease) 08/25/2017   CAD in native artery 04/14/2017   Hyperlipidemia associated with type 2 diabetes mellitus ([place]) 04/14/2017   Diabetic peripheral neuropathy associated with type 2 diabetes mellitus ([place]) 08/17/2016   T2DM (type 2 diabetes mellitus) ([place]) 05/02/2016   Hypertension associated with diabetes ([place]) 05/02/2016   PHYSICAL THERAPY DISCHARGE SUMMARY  Visits from Start of Care:  33  Current functional level related to goals / functional outcomes: At time of D/C the patient was starting at the gym    Remaining deficits: Continues to require strengthening but can be done on his own   Education / Equipment: HEP and gym program    Patient agrees to discharge. Patient goals were met. Patient is being discharged due to meeting the stated rehab goals.   Carney Living, PT 08/09/2021, 6:39 PM  [facility] [address] [place], [place], [address] Phone: [phone]   Fax:  [phone]  Name: Chasten Blaze MRN: [id number] Date of Birth: 06-13-1956

## 2021-08-23 ENCOUNTER — Other Ambulatory Visit (INDEPENDENT_AMBULATORY_CARE_PROVIDER_SITE_OTHER): Payer: HMO

## 2021-08-23 DIAGNOSIS — E1159 Type 2 diabetes mellitus with other circulatory complications: Secondary | ICD-10-CM

## 2021-08-23 DIAGNOSIS — I152 Hypertension secondary to endocrine disorders: Secondary | ICD-10-CM | POA: Diagnosis not present

## 2021-08-23 DIAGNOSIS — Z794 Long term (current) use of insulin: Secondary | ICD-10-CM

## 2021-08-23 DIAGNOSIS — E1142 Type 2 diabetes mellitus with diabetic polyneuropathy: Secondary | ICD-10-CM | POA: Diagnosis not present

## 2021-08-23 LAB — LIPID PANEL
Cholesterol: 113 mg/dL (ref 0–200)
HDL: 34.5 mg/dL — ABNORMAL LOW (ref >39.00)
LDL Cholesterol: 53 mg/dL (ref 0–99)
NonHDL: 78.14
Total CHOL/HDL Ratio: 3
Triglycerides: 126 mg/dL (ref 0.0–149.0)
VLDL: 25.2 mg/dL (ref 0.0–40.0)

## 2021-08-23 LAB — CBC
HCT: 37 % — ABNORMAL LOW (ref 39.0–52.0)
Hemoglobin: 12.5 g/dL — ABNORMAL LOW (ref 13.0–17.0)
MCHC: 33.7 g/dL (ref 30.0–36.0)
MCV: 92.8 fl (ref 78.0–100.0)
Platelets: 428 10*3/uL — ABNORMAL HIGH (ref 150.0–400.0)
RBC: 3.99 Mil/uL — ABNORMAL LOW (ref 4.22–5.81)
RDW: 13.5 % (ref 11.5–15.5)
WBC: 10.9 10*3/uL — ABNORMAL HIGH (ref 4.0–10.5)

## 2021-08-23 LAB — COMPREHENSIVE METABOLIC PANEL
ALT: 16 U/L (ref 0–53)
AST: 17 U/L (ref 0–37)
Albumin: 4.3 g/dL (ref 3.5–5.2)
Alkaline Phosphatase: 59 U/L (ref 39–117)
BUN: 55 mg/dL — ABNORMAL HIGH (ref 6–23)
CO2: 24 mEq/L (ref 19–32)
Calcium: 9.3 mg/dL (ref 8.4–10.5)
Chloride: 104 mEq/L (ref 96–112)
Creatinine, Ser: 2.24 mg/dL — ABNORMAL HIGH (ref 0.40–1.50)
GFR: 29.74 mL/min — ABNORMAL LOW (ref >60.00)
Glucose, Bld: 128 mg/dL — ABNORMAL HIGH (ref 70–99)
Potassium: 4.3 mEq/L (ref 3.5–5.1)
Sodium: 140 mEq/L (ref 135–145)
Total Bilirubin: 0.5 mg/dL (ref 0.2–1.2)
Total Protein: 6.8 g/dL (ref 6.0–8.3)

## 2021-08-23 LAB — TSH: TSH: 4 u[IU]/mL (ref 0.35–5.50)

## 2021-08-26 NOTE — Progress Notes (Signed)
Please inform patient of the following:  His labs are all stable. Do not need to make any changes to his treatment plan at this time. We can discuss more at his upcoming appointment.  Jeffrey [name]. [name], MD 08/26/2021 8:02 AM

## 2021-08-27 ENCOUNTER — Encounter: Payer: Self-pay | Admitting: Family Medicine

## 2021-08-27 ENCOUNTER — Other Ambulatory Visit: Payer: Self-pay

## 2021-08-27 ENCOUNTER — Ambulatory Visit (INDEPENDENT_AMBULATORY_CARE_PROVIDER_SITE_OTHER): Payer: HMO | Admitting: Family Medicine

## 2021-08-27 VITALS — BP 120/74 | HR 73 | Temp 97.8°F | Ht 68.0 in | Wt 160.6 lb

## 2021-08-27 DIAGNOSIS — E1159 Type 2 diabetes mellitus with other circulatory complications: Secondary | ICD-10-CM

## 2021-08-27 DIAGNOSIS — I152 Hypertension secondary to endocrine disorders: Secondary | ICD-10-CM

## 2021-08-27 DIAGNOSIS — E1169 Type 2 diabetes mellitus with other specified complication: Secondary | ICD-10-CM

## 2021-08-27 DIAGNOSIS — N1832 Chronic kidney disease, stage 3b: Secondary | ICD-10-CM

## 2021-08-27 DIAGNOSIS — D72829 Elevated white blood cell count, unspecified: Secondary | ICD-10-CM

## 2021-08-27 DIAGNOSIS — E1142 Type 2 diabetes mellitus with diabetic polyneuropathy: Secondary | ICD-10-CM

## 2021-08-27 DIAGNOSIS — Z0001 Encounter for general adult medical examination with abnormal findings: Secondary | ICD-10-CM | POA: Diagnosis not present

## 2021-08-27 DIAGNOSIS — Z794 Long term (current) use of insulin: Secondary | ICD-10-CM | POA: Diagnosis not present

## 2021-08-27 DIAGNOSIS — E785 Hyperlipidemia, unspecified: Secondary | ICD-10-CM | POA: Diagnosis not present

## 2021-08-27 MED ORDER — DAPAGLIFLOZIN PROPANEDIOL 5 MG PO TABS: 5.0000 mg | ORAL_TABLET | Freq: Every day | ORAL | 3 refills | Status: DC

## 2021-08-27 NOTE — Assessment & Plan Note (Signed)
Blood pressure at goal on Coreg 3.125 mg twice daily and spironolactone 25 mg daily.  Recent BMET stable stable function.  We will be starting [place] for diabetes as below.

## 2021-08-27 NOTE — Assessment & Plan Note (Signed)
Last A1c 7.6.  He is still having some issues with diarrhea with metformin.  We will stop today.  We will start [place].  This should hopefully help some with maintaining his kidney function as well.  We discussed potential side effects.  We will recheck A1c in a couple of months.

## 2021-08-27 NOTE — Progress Notes (Signed)
° °Chief Complaint:  °Jeffrey [name] is a 65 y.o. male who presents today for his annual comprehensive physical exam.   ° °Assessment/Plan:  °Chronic Problems Addressed Today: °Hypertension associated with diabetes ([name]) °Blood pressure at goal on Coreg 3.125 mg twice daily and spironolactone 25 mg daily.  Recent BMET stable stable function.  We will be starting Farxiga for diabetes as below. ° °[name] °Last A1c 7.6.  He is still having some issues with diarrhea with metformin.  We will stop today.  We will start Farxiga.  This should hopefully help some with maintaining his kidney function as well.  We discussed potential side effects.  We will recheck A1c in a couple of months. ° °CKD (chronic kidney disease) stage 3, GFR 30-59 ml/min ([name]) °Creatinine stable on recent BMET. ° °Hyperlipidemia associated with type 2 diabetes mellitus ([name]) °Last LDL 53.  Continue Lipitor 80 mg daily. ° ° °Preventative Healthcare: °UTD on blood work and cancer screening.  ° °Patient Counseling(The following topics were reviewed and/or handout was given): ° -Nutrition: Stressed importance of moderation in sodium/caffeine intake, saturated fat and cholesterol, caloric balance, sufficient intake of fresh fruits, vegetables, and fiber. ° -Stressed the importance of regular exercise.  ° -Substance Abuse: Discussed cessation/primary prevention of tobacco, alcohol, or other drug use; driving or other dangerous activities under the influence; availability of treatment for abuse.  ° -Injury prevention: Discussed safety belts, safety helmets, smoke detector, smoking near bedding or upholstery.  ° -Sexuality: Discussed sexually transmitted diseases, partner selection, use of condoms, avoidance of unintended pregnancy and contraceptive alternatives.  ° -Dental health: Discussed importance of regular tooth brushing, flossing, and dental visits. ° -Health maintenance and immunizations reviewed. Please refer to  Health maintenance section. ° °Return to care in 1 year for next preventative visit.  ° °  °Subjective:  °HPI: ° °He has no acute complaints today.  ° °He is accompanied by his brother today. His blood sugar has been well controlled at home. He does have some highs but he think this is due to stress. He notes his bowel movements has been better than before. We decrease his Metformin to 1000 mg daily in the last visit. He is following healthy diet and has been exercising. He is drinking diet coke. Still has going issues with diarrhea. Additionally, he would like to discuss about his blood work.  ° °He also has had issue with feet pain. He thinks this is due to exercise. His brother notes he has been exercising tremendously which could be causing the pain. No numbness or tingling.  ° °Lifestyle °Diet: Balanced. °Exercise: Goes to gym ° °Depression screen PHQ 2/9 04/23/2021  °Decreased Interest 0  °Down, Depressed, Hopeless 0  °PHQ - 2 Score 0  °Altered sleeping -  °Tired, decreased energy -  °Change in appetite -  °Feeling bad or failure about yourself  -  °Trouble concentrating -  °Moving slowly or fidgety/restless -  °Suicidal thoughts -  °PHQ-9 Score -  °Difficult doing work/chores -  °Some recent data might be hidden  ° ° °Health Maintenance Due  °Topic Date Due  ° Zoster Vaccines- Shingrix (1 of 2) Never done  ° URINE MICROALBUMIN  05/29/2017  °  ° °ROS: Per HPI, otherwise a complete review of systems was negative.  ° °PMH: ° °The following were reviewed and entered/updated in epic: °Past Medical History:  °Diagnosis Date  ° Cardiac arrest ([name]) 05/22/2016  ° Cataract   ° CHF (congestive   heart failure) ([name])   ° CKD (chronic kidney disease) stage 3, GFR 30-59 ml/min ([name]) 05/28/2021  ° Diabetes mellitus without complication ([name])   ° type 2  ° Diabetic retinopathy ([name])   ° Hyperlipidemia   ° Hypertension   ° Memory loss   ° mild  ° Myocardial infarct ([name]) 2105  ° Retinopathy   ° Both eyes  ° S/P primary  angioplasty with coronary stent 05/22/2016  ° Vitamin B12 deficiency   ° Vitreous hemorrhage of left eye ([name])   ° proliferative diabetic retinopathy  ° °Patient Active Problem List  ° Diagnosis Date Noted  ° CKD (chronic kidney disease) stage 3, GFR 30-59 ml/min ([name]) 05/28/2021  ° Adjustment disorder 01/22/2021  ° Chronic back pain 10/22/2020  ° Peripheral vertigo 05/15/2020  ° Dermatitis 01/04/2020  ° Unintentional weight loss with loose stools 05/19/2019  ° Low vitamin B12 level 03/29/2019  ° Heme positive stool 03/14/2019  ° Normocytic anemia 03/14/2019  ° Chronic diastolic heart failure ([name]) 02/08/2019  ° Diabetic retinopathy ([name]) 09/08/2018  ° Physical debility 05/24/2018  ° Varicose veins of both lower extremities 03/01/2018  ° Fatigue due to exertion 10/14/2017  ° GERD (gastroesophageal reflux disease) 08/25/2017  ° CAD in native artery 04/14/2017  ° Hyperlipidemia associated with type 2 diabetes mellitus ([name]) 04/14/2017  ° Diabetic peripheral neuropathy associated with type 2 diabetes mellitus ([name]) 08/17/2016  ° [name] 05/02/2016  ° Hypertension associated with diabetes ([name]) 05/02/2016  ° °Past Surgical History:  °Procedure Laterality Date  ° ANGIOPLASTY    ° 2015  ° COLONOSCOPY    ° multiple  ° EYE SURGERY    ° PARS PLANA VITRECTOMY Left 03/15/2020  ° Procedure: PARS PLANA VITRECTOMY WITH 25 GAUGE;  Surgeon: Sanders, Jason, MD;  Location: [facility] OR;  Service: Ophthalmology;  Laterality: Left;  ° PHOTOCOAGULATION WITH LASER Left 03/15/2020  ° Procedure: PHOTOCOAGULATION WITH LASER; INTRAVITREAL INJECTION OF AVASTIN;  Surgeon: Sanders, Jason, MD;  Location: [facility] OR;  Service: Ophthalmology;  Laterality: Left;  ° REFRACTIVE SURGERY    ° UPPER GI ENDOSCOPY    ° several  ° VITRECTOMY    ° ° °Family History  °Problem Relation Age of Onset  ° Alzheimer's disease Mother   ° Heart disease Father   ° Peripheral Artery Disease Father   ° Heart disease Brother   ° Colon polyps Neg Hx   °  Prostate cancer Neg Hx   ° Rectal cancer Neg Hx   ° Esophageal cancer Neg Hx   ° ° °Medications- reviewed and updated °Current Outpatient Medications  °Medication Sig Dispense Refill  ° acetaminophen (TYLENOL) 325 MG tablet Take 325-650 mg by mouth every 6 (six) hours as needed for moderate pain.    ° aspirin EC 81 MG tablet Take 81 mg by mouth daily.    ° atorvastatin (LIPITOR) 80 MG tablet TAKE 1 TABLET BY MOUTH EVERY DAY 90 tablet 1  ° Blood Glucose Monitoring Suppl (ONETOUCH VERIO IQ SYSTEM) w/Device KIT 1 kit by Does not apply route 4 (four) times daily. 1 kit 0  ° carvedilol (COREG) 3.125 MG tablet TAKE 1 TABLET BY MOUTH 2 TIMES DAILY WITH MEAL 180 tablet 1  ° dapagliflozin propanediol (FARXIGA) 5 MG TABS tablet Take 1 tablet (5 mg total) by mouth daily before breakfast. 90 tablet 3  ° DUREZOL 0.05 % EMUL Place 1 drop into the left eye 2 (two) times daily.    ° furosemide (LASIX) 40 MG   MG tablet TAKE 1 TABLET BY MOUTH EVERY DAY 90 tablet 1   gentamicin (GARAMYCIN) 0.3 % ophthalmic solution      gentamicin-prednisoLONE 0.3-1 % ophthalmic drops 1 drop 2 (two) times daily.     Insulin Pen Needle (PEN NEEDLES) 33G X 4 MM MISC 1 application by Does not apply route in the morning, at noon, in the evening, and at bedtime. 300 each prn   Insulin Syringe-Needle U-100 (B-D INS SYR HALF-UNIT .3CC/31G) 31G X 5/16" 0.3 ML MISC Use 6 times a day 200 each 11   LANTUS SOLOSTAR 100 UNIT/ML Solostar Pen INJECT 30 UNITS INTO THE SKIN DAILY. 15 mL 2   meclizine (ANTIVERT) 25 MG tablet Take 1 tablet (25 mg total) by mouth 3 (three) times daily as needed for dizziness. 30 tablet 0   NOVOLOG 100 UNIT/ML injection USE 3X DAILY AS NEEDED FOR HIGH BLOOD SUGARS. 5 UNITS FOR SUGAR >200, 10 UNITS >300, 15 UNITS 400+ 10 mL 5   ofloxacin (OCUFLOX) 0.3 % ophthalmic solution Place 1 drop into the left eye 4 (four) times daily.     OneTouch Delica Lancets 48J MISC Use to check blood sugar 4 times per day 200 each 11   ONETOUCH VERIO  test strip CHECK BLOOD SUGAR 4 TIMES PER DAY 200 strip 9   spironolactone (ALDACTONE) 25 MG tablet Take 1 tablet (25 mg total) by mouth daily. 90 tablet 3   triamcinolone cream (KENALOG) 0.1 % Apply 1 application topically 2 (two) times daily. 454 g 0   vitamin B-12 (CYANOCOBALAMIN) 1000 MCG tablet Take 1,000 mcg by mouth daily.     No current facility-administered medications for this visit.    Allergies-reviewed and updated Allergies  Allergen Reactions   Codeine Nausea And Vomiting   Losartan    Penicillins Rash    Did it involve swelling of the face/tongue/throat, SOB, or low BP? no Did it involve sudden or severe rash/hives, skin peeling, or any reaction on the inside of your mouth or nose? yes Did you need to seek medical attention at a hospital or doctor's office? yes When did it last happen?    childhood   If all above answers are NO, may proceed with cephalosporin use.     Social History   Socioeconomic History   Marital status: Single    Spouse name: Not on file   Number of children: Not on file   Years of education: Not on file   Highest education level: Not on file  Occupational History   Not on file  Tobacco Use   Smoking status: Never   Smokeless tobacco: Never  Vaping Use   Vaping Use: Never used  Substance and Sexual Activity   Alcohol use: Not Currently    Comment: occ   Drug use: No   Sexual activity: Not Currently  Other Topics Concern   Not on file  Social History Narrative   Not on file   Social Determinants of Health   Financial Resource Strain: Not on file  Food Insecurity: Not on file  Transportation Needs: Not on file  Physical Activity: Not on file  Stress: Not on file  Social Connections: Not on file        Objective:  Physical Exam: BP 120/74    Pulse 73    Temp 97.8 F (36.6 C) (Temporal)    Ht 5' 8" (1.727 m)    Wt 160 lb 9.6 oz (72.8 kg)    SpO2 98%  BMI 24.42 kg/m²   °Body mass index is 24.42 kg/m². °Wt Readings from  Last 3 Encounters:  °08/27/21 160 lb 9.6 oz (72.8 kg)  °07/23/21 161 lb 12.8 oz (73.4 kg)  °06/03/21 159 lb 9.6 oz (72.4 kg)  ° °Gen: NAD, resting comfortably °HEENT: TMs normal bilaterally. OP clear. No thyromegaly noted.  °CV: RRR with no murmurs appreciated °Pulm: NWOB, CTAB with no crackles, wheezes, or rhonchi °GI: Normal bowel sounds present. Soft, Nontender, Nondistended. °MSK: no edema, cyanosis, or clubbing noted °Skin: warm, dry °Neuro: CN2-12 grossly intact. Strength 5/5 in upper and lower extremities. Reflexes symmetric and intact bilaterally.  °Psych: Normal affect and thought content °   ° ° °I,Savera Zaman,acting as a scribe for Caleb Parker, MD.,have documented all relevant documentation on the behalf of Caleb Parker, MD,as directed by  Caleb Parker, MD while in the presence of Caleb Parker, MD.  ° °I, Caleb Parker, MD, have reviewed all documentation for this visit. The documentation on 08/27/21 for the exam, diagnosis, procedures, and orders are all accurate and complete. ° °Caleb M. Parker, MD °08/27/2021 2:11 PM  ° °

## 2021-08-27 NOTE — Assessment & Plan Note (Signed)
Last LDL 53.  Continue Lipitor 80 mg daily.

## 2021-08-27 NOTE — Patient Instructions (Signed)
It was very nice to see you today!  Please stop the metformin.  We will start [place].  Please send me a message in a few weeks let me know this is working.  I will see back in a couple of months to recheck your A1c.  Please come back sooner if needed.  Take care, Dr [name]  PLEASE NOTE:  If you had any lab tests please let us know if you have not heard back within a few days. You may see your results on mychart before we have a chance to review them but we will give you a call once they are reviewed by [place]. If we ordered any referrals today, please let us know if you have not heard from their office within the next week.   Please try these tips to maintain a healthy lifestyle:  Eat at least 3 REAL meals and 1-2 snacks per day.  Aim for no more than 5 hours between eating.  If you eat breakfast, please do so within one hour of getting up.   Each meal should contain half fruits/vegetables, one quarter protein, and one quarter carbs (no bigger than a computer mouse)  Cut down on sweet beverages. This includes juice, soda, and sweet tea.   Drink at least 1 glass of water with each meal and aim for at least 8 glasses per day  Exercise at least 150 minutes every week.    Preventive Care 51 Years and Older, Male Preventive care refers to lifestyle choices and visits with your health care provider that can promote health and wellness. Preventive care visits are also called wellness exams. What can I expect for my preventive care visit? Counseling During your preventive care visit, your health care provider may ask about your: Medical history, including: Past medical problems. Family medical history. History of falls. Current health, including: Emotional well-being. Home life and relationship well-being. Sexual activity. Memory and ability to understand (cognition). Lifestyle, including: Alcohol, nicotine or tobacco, and drug use. Access to firearms. Diet, exercise, and sleep  habits. Work and work Statistician. Sunscreen use. Safety issues such as seatbelt and bike helmet use. Physical exam Your health care provider will check your: Height and weight. These may be used to calculate your BMI (body mass index). BMI is a measurement that tells if you are at a healthy weight. Waist circumference. This measures the distance around your waistline. This measurement also tells if you are at a healthy weight and may help predict your risk of certain diseases, such as type 2 diabetes and high blood pressure. Heart rate and blood pressure. Body temperature. Skin for abnormal spots. What immunizations do I need? Vaccines are usually given at various ages, according to a schedule. Your health care provider will recommend vaccines for you based on your age, medical history, and lifestyle or other factors, such as travel or where you work. What tests do I need? Screening Your health care provider may recommend screening tests for certain conditions. This may include: Lipid and cholesterol levels. Diabetes screening. This is done by checking your blood sugar (glucose) after you have not eaten for a while (fasting). Hepatitis C test. Hepatitis B test. HIV (human immunodeficiency virus) test. STI (sexually transmitted infection) testing, if you are at risk. Lung cancer screening. Colorectal cancer screening. Prostate cancer screening. Abdominal aortic aneurysm (AAA) screening. You may need this if you are a current or former smoker. Talk with your health care provider about your test results, treatment options, and if  necessary, the need for more tests. Follow these instructions at home: Eating and drinking  Eat a diet that includes fresh fruits and vegetables, whole grains, lean protein, and low-fat dairy products. Limit your intake of foods with high amounts of sugar, saturated fats, and salt. Take vitamin and mineral supplements as recommended by your health care  provider. Do not drink alcohol if your health care provider tells you not to drink. If you drink alcohol: Limit how much you have to 0-2 drinks a day. Know how much alcohol is in your drink. In the [place] one drink equals one 12 oz bottle of beer (355 mL), one 5 oz glass of wine (148 mL), or one 1 oz glass of hard liquor (44 mL). Lifestyle Brush your teeth every morning and night with fluoride toothpaste. Floss one time each day. Exercise for at least 30 minutes 5 or more days each week. Do not use any products that contain nicotine or tobacco. These products include cigarettes, chewing tobacco, and vaping devices, such as e-cigarettes. If you need help quitting, ask your health care provider. Do not use drugs. If you are sexually active, practice safe sex. Use a condom or other form of protection to prevent STIs. Take aspirin only as told by your health care provider. Make sure that you understand how much to take and what form to take. Work with your health care provider to find out whether it is safe and beneficial for you to take aspirin daily. Ask your health care provider if you need to take a cholesterol-lowering medicine (statin). Find healthy ways to manage stress, such as: Meditation, yoga, or listening to music. Journaling. Talking to a trusted person. Spending time with friends and family. Safety Always wear your seat belt while driving or riding in a vehicle. Do not drive: If you have been drinking alcohol. Do not ride with someone who has been drinking. When you are tired or distracted. While texting. If you have been using any mind-altering substances or drugs. Wear a helmet and other protective equipment during sports activities. If you have firearms in your house, make sure you follow all gun safety procedures. Minimize exposure to UV radiation to reduce your risk of skin cancer. What's next? Visit your health care provider once a year for an annual wellness visit. Ask  your health care provider how often you should have your eyes and teeth checked. Stay up to date on all vaccines. This information is not intended to replace advice given to you by your health care provider. Make sure you discuss any questions you have with your health care provider. Document Revised: 01/23/2021 Document Reviewed: 01/23/2021 Elsevier Patient Education  [employer].

## 2021-08-27 NOTE — Assessment & Plan Note (Signed)
Creatinine stable on recent BMET.

## 2021-08-30 ENCOUNTER — Encounter: Payer: Self-pay | Admitting: Family Medicine

## 2021-08-30 NOTE — Telephone Encounter (Signed)
His kidney numbers have been stable the last several times we have checked. He needs to make sure he is getting plenty of water and staying well hydrated.  [name]. [name], MD 08/30/2021 3:57 PM

## 2021-08-30 NOTE — Telephone Encounter (Signed)
I appreciate the update. Can we have them go back to 500mg  of metformin and check in with [place] next week? How high have the sugars been?  I would like for them to stay on farxiga if possible due to the other benefits.  It will probably take a few more days before the farxiga takes full effect.   [name]. [name], MD 08/30/2021 3:57 PM

## 2021-08-30 NOTE — Telephone Encounter (Signed)
See note

## 2021-09-01 ENCOUNTER — Other Ambulatory Visit: Payer: Self-pay | Admitting: Family Medicine

## 2021-09-03 ENCOUNTER — Encounter: Payer: Self-pay | Admitting: Family Medicine

## 2021-09-03 NOTE — Telephone Encounter (Signed)
See note

## 2021-09-03 NOTE — Telephone Encounter (Signed)
I think that is reasonable. He can continue taking the 5mg  of farxiga and we will see him back in a few weeks. Please help him schedule appt.  [name]. [name], MD 09/03/2021 1:07 PM

## 2021-09-13 ENCOUNTER — Encounter: Payer: Self-pay | Admitting: Family Medicine

## 2021-09-13 ENCOUNTER — Ambulatory Visit (INDEPENDENT_AMBULATORY_CARE_PROVIDER_SITE_OTHER): Payer: HMO | Admitting: Family Medicine

## 2021-09-13 ENCOUNTER — Other Ambulatory Visit: Payer: Self-pay

## 2021-09-13 VITALS — BP 118/74 | HR 54 | Temp 97.4°F | Ht 68.0 in | Wt 160.0 lb

## 2021-09-13 DIAGNOSIS — Z794 Long term (current) use of insulin: Secondary | ICD-10-CM | POA: Diagnosis not present

## 2021-09-13 DIAGNOSIS — N1832 Chronic kidney disease, stage 3b: Secondary | ICD-10-CM | POA: Diagnosis not present

## 2021-09-13 DIAGNOSIS — I152 Hypertension secondary to endocrine disorders: Secondary | ICD-10-CM | POA: Diagnosis not present

## 2021-09-13 DIAGNOSIS — E1159 Type 2 diabetes mellitus with other circulatory complications: Secondary | ICD-10-CM | POA: Diagnosis not present

## 2021-09-13 DIAGNOSIS — L570 Actinic keratosis: Secondary | ICD-10-CM

## 2021-09-13 DIAGNOSIS — E1142 Type 2 diabetes mellitus with diabetic polyneuropathy: Secondary | ICD-10-CM | POA: Diagnosis not present

## 2021-09-13 MED ORDER — METFORMIN HCL ER 500 MG PO TB24: 1000.0000 mg | ORAL_TABLET | Freq: Every day | ORAL | 3 refills | Status: DC

## 2021-09-13 NOTE — Assessment & Plan Note (Signed)
Had lengthy discussion with patient and his mother today regarding his treatment plan.  Jeffrey [name] has not worked well and has not controlled his blood sugar and additionally is having several side effects.  We will go back to metformin 1000 mg daily.  He does have CKD stage III however is GFR has been stable recently -he should be fine at 1000 mg total daily.  They will come back in about 6 weeks to recheck A1c.

## 2021-09-13 NOTE — Patient Instructions (Signed)
It was very nice to see you today!  Stop the [place].  Please go back to metformin.  Please send a message in a week or 2 to let me know how you are doing.  Please come back in about 6 weeks for blood work and your follow-up office visit.  Take care, Dr [name]  PLEASE NOTE:  If you had any lab tests please let us know if you have not heard back within a few days. You may see your results on mychart before we have a chance to review them but we will give you a call once they are reviewed by [place]. If we ordered any referrals today, please let us know if you have not heard from their office within the next week.   Please try these tips to maintain a healthy lifestyle:  Eat at least 3 REAL meals and 1-2 snacks per day.  Aim for no more than 5 hours between eating.  If you eat breakfast, please do so within one hour of getting up.   Each meal should contain half fruits/vegetables, one quarter protein, and one quarter carbs (no bigger than a computer mouse)  Cut down on sweet beverages. This includes juice, soda, and sweet tea.   Drink at least 1 glass of water with each meal and aim for at least 8 glasses per day  Exercise at least 150 minutes every week.

## 2021-09-13 NOTE — Assessment & Plan Note (Signed)
At goal today on Coreg 3.125 mg twice daily and spironolactone 25 mg daily. 

## 2021-09-13 NOTE — Assessment & Plan Note (Signed)
They will  come back soon to recheck c-Met.

## 2021-09-13 NOTE — Progress Notes (Signed)
° °  Jeffrey [name] is a 66 y.o. male who presents today for an office visit.  Assessment/Plan:  New/Acute Problems: Skin Lesion Consistent with possible actinic keratosis.  Cryotherapy applied today.  See below procedure note.  Tolerated well.  Chronic Problems Addressed Today: T2DM (type 2 diabetes mellitus) ([place]) Had lengthy discussion with patient and his mother today regarding his treatment plan.  Jeffrey [name] has not worked well and has not controlled his blood sugar and additionally is having several side effects.  We will go back to metformin 1000 mg daily.  He does have CKD stage III however is GFR has been stable recently -he should be fine at 1000 mg total daily.  They will come back in about 6 weeks to recheck A1c.  CKD (chronic kidney disease) stage 3, GFR 30-59 ml/min ([name]) They will  come back soon to recheck c-Met.  Hypertension associated with diabetes ([place]) At goal today on Coreg 3.125 mg twice daily and spironolactone 25 mg daily.     Subjective:  HPI:  Patient here for follow-up.  He is here with his brother today.  We saw him a couple of weeks ago for his annual physical.  At that point we stopped his metformin and started him on Farxiga.  Unfortunately has not done well with this transition.  His sugars have been very elevated in the 200s and 300s.  Is also had 1 reading into the 400s.  He has been having to use a lot of NovoLog for his high blood sugars.  He has also had some side effects with dizziness, frequent urination, and constipation.  He also has a skin lesion on left lower back.  Is been there for quite a while.  Becomes irritated.  No specific treatments tried.       Objective:  Physical Exam: BP 118/74 (BP Location: Left Arm)    Pulse (!) 54    Temp (!) 97.4 F (36.3 C) (Temporal)    Ht _0  (1.727 m)    Wt 160 lb (72.6 kg)    SpO2 96%    BMI 24.33 kg/m   Gen: No acute distress, resting comfortably Skin: Approximately 2 mm excoriated hyperkeratotic  lesion on left lower back. Neuro: Grossly normal, moves all extremities Psych: Normal affect and thought content  Cryotherapy Procedure Note  Pre-operative Diagnosis: Actinic keratosis  Locations: Left Lower Back  Indications: Therapeutic  Procedure Details  Patient informed of risks (permanent scarring, infection, light or dark discoloration, bleeding, infection, weakness, numbness and recurrence of the lesion) and benefits of the procedure and verbal informed consent obtained.  The areas are treated with liquid nitrogen therapy, frozen until ice ball extended 3 mm beyond lesion, allowed to thaw, and treated again. The patient tolerated procedure well.  The patient was instructed on post-op care, warned that there may be blister formation, redness and [name]. Recommend OTC analgesia as needed for [name].  Condition: Stable  Complications: none.  Time Spent: 45 minutes of total time was spent on the date of the encounter performing the following actions: chart review prior to seeing the patient including recent vist and labs, obtaining history, performing a medically necessary exam, counseling on the treatment plan, placing orders, and documenting in our EHR. This time does not include time spent performing above procedure.       Jeffrey [name]. Jeffrey Pain, MD 09/13/2021 10:07 AM

## 2021-09-18 ENCOUNTER — Encounter: Payer: Self-pay | Admitting: Family Medicine

## 2021-09-18 ENCOUNTER — Other Ambulatory Visit: Payer: Self-pay | Admitting: Family Medicine

## 2021-09-19 ENCOUNTER — Ambulatory Visit: Payer: HMO | Admitting: Family Medicine

## 2021-09-19 ENCOUNTER — Encounter (HOSPITAL_BASED_OUTPATIENT_CLINIC_OR_DEPARTMENT_OTHER): Payer: Self-pay | Admitting: Cardiovascular Disease

## 2021-09-19 ENCOUNTER — Telehealth: Payer: Self-pay | Admitting: Family Medicine

## 2021-09-19 ENCOUNTER — Ambulatory Visit (INDEPENDENT_AMBULATORY_CARE_PROVIDER_SITE_OTHER): Payer: HMO | Admitting: Cardiovascular Disease

## 2021-09-19 ENCOUNTER — Other Ambulatory Visit: Payer: Self-pay

## 2021-09-19 ENCOUNTER — Telehealth: Payer: Self-pay | Admitting: Cardiovascular Disease

## 2021-09-19 VITALS — BP 110/60 | HR 81 | Ht 68.0 in | Wt 164.4 lb

## 2021-09-19 DIAGNOSIS — R0602 Shortness of breath: Secondary | ICD-10-CM

## 2021-09-19 DIAGNOSIS — R079 Chest pain, unspecified: Secondary | ICD-10-CM | POA: Diagnosis not present

## 2021-09-19 DIAGNOSIS — T733XXD Exhaustion due to excessive exertion, subsequent encounter: Secondary | ICD-10-CM

## 2021-09-19 DIAGNOSIS — I2510 Atherosclerotic heart disease of native coronary artery without angina pectoris: Secondary | ICD-10-CM | POA: Diagnosis not present

## 2021-09-19 DIAGNOSIS — I5032 Chronic diastolic (congestive) heart failure: Secondary | ICD-10-CM

## 2021-09-19 NOTE — Progress Notes (Signed)
Cardiology Office Note  Date:  09/19/2021   ID:  Jeffrey [name], DOB [birth year], MRN [id number]  PCP:  Jeffrey Barrack, MD  Cardiologist:   Jeffrey Latch, MD   No chief complaint on file.    History of Present Illness: Jeffrey [name] is a 66 y.o. male with CAD s/p MI, hypertension, hyperlipidemia, CKD 3, diabetes and peripheral neuropathy who presents for follow up.  Jeffrey [name] had a cardiac arrest 11/23/13 and underwent PCI of the LAD.  Echo 10/2015 revealed an anteroseptal and apical septal infarct with associated hypokinesis.  LVEF was 55%.  He had a PET CT/myocardial perfusion scan 10/2015 that revealed a moderate sized, moderate intensity antero-apical infarct with mild ischemia.  At the end of 2016 he had an episode of stabbing chest pain.  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  At that time his clopidogrel was discontinued and he underwent several laser procedures.  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     Jeffrey [name] reported atypical chest pain. He was referred for [facility] 09/2017 that revealed LVEF 48% and a prior infarct in the apical inferior, apical lateral, and apical regions. There was no ischemia. He has struggled with syncope and his antihypertensives were reduced at prior appointments. Jeffrey [name] was seen in the ED 05/2019 with an episode of syncope. There was no preceding chest pain but he did start feeling dizzy. He believed that this was mostly due to anxiety and getting worked up. This occurred in the setting of generalized weakness, weight loss, and loose stools. His work-up was remarkable only for mild intravascular volume depletion. He received IV fluids and was instructed to follow-up with his PCP. He saw Dr. [name] the following day who recommended an echocardiogram but he declined.    He required injections in his L eye  for diabetic retinopathy and vitreous hemorrhage.  His vision improved. At his last appointment, he was doing well and attending physical therapy. His breathing was improving with exercise. However, he continued to have episodes of dizziness. Given his diabetes and CKD, spironolactone was switched to losartan.   He called the office today reporting a pinching chest pain. Today, he is accompanied by a family member. 4 weeks ago, he felt chest pain he describes as a "jolt" in his central chest. The pain is faint and dissimilar to what he felt during his MI. Palpation does not worsen the pain. Recently, the chest pain has been occurring more frequently. The pain also causes him to feel fatigued and tired which is preventing him from performing activities like going to the gym and getting in the shower. He joined the gym in [place] and wonders if the weights are too strenuous for his heart. Since the pain started, he is hesitant to go back to the gym. Last night, his bilateral feet were swollen "like a balloon" and the skin was shiny. He has trouble balancing but he does not relate the imbalance to the swelling. He stopped taking [place] because his blood sugar was not controlled and he was experiencing constipation. His stool was hard and painful and needed up to 4 enemas to help them pass. He denies any palpitations, lightheadedness, headaches, syncope, orthopnea, or PND.  Past Medical History:  Diagnosis Date   Cardiac arrest ([place]) 05/22/2016   Cataract    CHF (congestive  heart failure) ([name])    CKD (chronic kidney disease) stage 3, GFR 30-59 ml/min ([name]) 05/28/2021   Diabetes mellitus without complication ([place])    type 2   Diabetic retinopathy ([place])    Hyperlipidemia    Hypertension    Memory loss    mild   Myocardial infarct ([place]) 2105   Retinopathy    Both eyes   S/P primary angioplasty with coronary stent 05/22/2016   Vitamin B12 deficiency    Vitreous hemorrhage of left eye ([place])     proliferative diabetic retinopathy    Past Surgical History:  Procedure Laterality Date   ANGIOPLASTY     2015   COLONOSCOPY     multiple   EYE SURGERY     PARS PLANA VITRECTOMY Left 03/15/2020   Procedure: PARS PLANA VITRECTOMY WITH 25 GAUGE;  Surgeon: Jeffrey Stalls, MD;  Location: [facility];  Service: Ophthalmology;  Laterality: Left;   PHOTOCOAGULATION WITH LASER Left 03/15/2020   Procedure: PHOTOCOAGULATION WITH LASER; INTRAVITREAL INJECTION OF AVASTIN;  Surgeon: Jeffrey Stalls, MD;  Location: [facility];  Service: Ophthalmology;  Laterality: Left;   REFRACTIVE SURGERY     UPPER GI ENDOSCOPY     several   VITRECTOMY       Current Outpatient Medications  Medication Sig Dispense Refill   spironolactone (ALDACTONE) 25 MG tablet Take 12.5 mg by mouth daily.     acetaminophen (TYLENOL) 325 MG tablet Take 325-650 mg by mouth every 6 (six) hours as needed for moderate pain.     aspirin EC 81 MG tablet Take 81 mg by mouth daily.     atorvastatin (LIPITOR) 80 MG tablet TAKE 1 TABLET BY MOUTH EVERY DAY 90 tablet 1   Blood Glucose Monitoring Suppl (ONETOUCH VERIO IQ SYSTEM) w/Device KIT 1 kit by Does not apply route 4 (four) times daily. 1 kit 0   carvedilol (COREG) 3.125 MG tablet TAKE 1 TABLET BY MOUTH 2 TIMES DAILY WITH MEAL 180 tablet 1   DUREZOL 0.05 % EMUL Place 1 drop into the left eye 2 (two) times daily.     furosemide (LASIX) 40 MG tablet TAKE 1 TABLET BY MOUTH EVERY DAY 90 tablet 1   gentamicin (GARAMYCIN) 0.3 % ophthalmic solution      gentamicin-prednisoLONE 0.3-1 % ophthalmic drops 1 drop 2 (two) times daily.     Insulin Pen Needle (PEN NEEDLES) 33G X 4 MM MISC 1 application by Does not apply route in the morning, at noon, in the evening, and at bedtime. 300 each prn   Insulin Syringe-Needle U-100 (B-D INS SYR HALF-UNIT .3CC/31G) 31G X 5/16" 0.3 ML MISC Use 6 times a day 200 each 11   LANTUS SOLOSTAR 100 UNIT/ML Solostar Pen INJECT 30 UNITS INTO THE SKIN DAILY. 30 mL 0   meclizine  (ANTIVERT) 25 MG tablet Take 1 tablet (25 mg total) by mouth 3 (three) times daily as needed for dizziness. (Patient not taking: Reported on 09/13/2021) 30 tablet 0   metFORMIN (GLUCOPHAGE-XR) 500 MG 24 hr tablet Take 2 tablets (1,000 mg total) by mouth daily with breakfast. 180 tablet 3   NOVOLOG 100 UNIT/ML injection USE 3X DAILY AS NEEDED FOR HIGH BLOOD SUGARS. 5 UNITS FOR SUGAR >200, 10 UNITS >300, 15 UNITS 400+ 10 mL 5   ofloxacin (OCUFLOX) 0.3 % ophthalmic solution Place 1 drop into the left eye 4 (four) times daily. (Patient not taking: Reported on 09/13/2021)     OneTouch Delica Lancets 47S MISC Use to check blood sugar  4 times per day 200 each 11   ONETOUCH VERIO test strip CHECK BLOOD SUGAR 4 TIMES PER DAY 200 strip 9   triamcinolone cream (KENALOG) 0.1 % Apply 1 application topically 2 (two) times daily. 454 g 0   vitamin B-12 (CYANOCOBALAMIN) 1000 MCG tablet Take 1,000 mcg by mouth daily.     No current facility-administered medications for this visit.    Allergies:   Codeine, Farxiga [dapagliflozin], Losartan, and Penicillins    Social History:  The patient  reports that he has never smoked. He has never used smokeless tobacco. He reports that he does not currently use alcohol. He reports that he does not use drugs.   Family History:  The patient's family history includes Alzheimer's disease in his mother; Heart disease in his brother and father; Peripheral Artery Disease in his father.    ROS:  Please see the history of present illness.   (+) Chest pain (+) Fatigue/Malaise (+) LE edema (bilateral feet) (+) Constipation (+) Imbalance All other systems are reviewed and negative.   PHYSICAL EXAM: VS:  BP 110/60 (BP Location: Left Arm, Patient Position: Sitting, Cuff Size: Normal)    Pulse 81    Ht '5\' 8"'  (1.727 m)    Wt 164 lb 6.4 oz (74.6 kg)    BMI 25.00 kg/m  , BMI Body mass index is 25 kg/m. GENERAL:  Well appearing HEENT: Pupils equal round and reactive, fundi not  visualized, oral mucosa unremarkable NECK:  No jugular venous distention, waveform within normal limits, carotid upstroke brisk and symmetric, no bruits LUNGS:  Clear to auscultation bilaterally HEART:  RRR.  PMI not displaced or sustained,S1 and S2 within normal limits, no S3, no S4, no clicks, no rubs, no murmurs ABD:  Flat, positive bowel sounds normal in frequency in pitch, no bruits, no rebound, no guarding, no midline pulsatile mass, no hepatomegaly, no splenomegaly EXT:  2 plus pulses throughout, 1+ edema up to the ankle bilaterally, no cyanosis no clubbing SKIN:  No rashes no nodules NEURO:  Cranial nerves II through XII grossly intact, motor grossly intact throughout [facility]:  Cognitively intact, oriented to person place and time  EKG:  09/19/21: Sinus rhythm, rate 81 bpm; LAD, prior inferior infarct, low voltage 05/28/21: Sinus rhythm Rate 71 bpm LAFB. Low voltage. Prior anteroseptal infarct  07/11/19: Sinus rhythm rate 75 bpm.Prior anterior infarct. 04/22/18: sinus rhythm.  Rate 69 bpm.  Low voltage.  Prior anterioseptal infarct 09/16/17: Sinus bradycardia rate 59 bpm.   11/18/16: Sinus rhythm.  Rate 64 bpm.  Low voltage limb and precordial leads 05/22/16 sinus rhythm rate 63 bpm.  Prior anteroseptal infarct.  LAFB  Echo 07/22/19: 1. Left ventricular ejection fraction, by visual estimation, is 50 to  55%. The left ventricle has low normal function. Left ventricular septal  wall thickness was mildly increased. There is no left ventricular  hypertrophy.   2. The left ventricle demonstrates regional wall motion abnormalities.   3. Normal GLS -18 Inferior basal and distal septal hypokinesis.   4. Global right ventricle has normal systolic function.The right  ventricular size is normal. No increase in right ventricular wall  thickness.   5. Left atrial size was mildly dilated.   6. Right atrial size was normal.   7. The mitral valve is normal in structure. Trivial mitral valve   regurgitation. No evidence of mitral stenosis.   8. The tricuspid valve is normal in structure. Tricuspid valve  regurgitation is not demonstrated.   9. The aortic  valve is tricuspid. Aortic valve regurgitation is mild.  Mild to moderate aortic valve sclerosis/calcification without any evidence  of aortic stenosis.  10. The pulmonic valve was normal in structure. Pulmonic valve  regurgitation is not visualized.  11. Aortic dilatation noted.  12. There is mild dilatation of the aortic root measuring 39 mm.  13. The inferior vena cava is normal in size with greater than 50%  respiratory variability, suggesting right atrial pressure of 3 mmHg.   Lexiscan Myoview 09/2017: The left ventricular ejection fraction is mildly decreased (45-54%). Nuclear stress EF: 48%. There was no ST segment deviation noted during stress. Defect 1: There is a small defect of severe severity present in the apical inferior, apical lateral and apex location. This is a low risk study.   Abnormal, low risk stress nuclear study with prior apical infarct; no ischemia; EF 48 with mild global hypokinesis; mild LVE.  Echo 11/01/15: LVEF >55%.  Mild concentric LVH. Mild anteroseptal and apical septal hypokinesis. Normal RV function.   Recent Labs: 08/23/2021: ALT 16; BUN 55; Creatinine, Ser 2.24; Hemoglobin 12.5; Platelets 428.0; Potassium 4.3; Sodium 140; TSH 4.00    Lipid Panel    Component Value Date/Time   CHOL 113 08/23/2021 0828   CHOL 116 01/06/2019 0819   TRIG 126.0 08/23/2021 0828   HDL 34.50 (L) 08/23/2021 0828   HDL 30 (L) 01/06/2019 0819   CHOLHDL 3 08/23/2021 0828   VLDL 25.2 08/23/2021 0828   LDLCALC 53 08/23/2021 0828   LDLCALC 58 01/06/2019 0819    09/07/15: Sodium 138, potassium 4.5, BUN 20, creatinine 1.02 AST 17, ALT 24 Total cholesterol 113, triglycerides 146, HDL 32, LDL 52 TSH 1.76 WBC 11.7, hemoglobin 13.4, hematocrit 40.2, platelets 317 Hemoglobin A1c 8.9 on a relatively dry%  Wt  Readings from Last 3 Encounters:  09/19/21 164 lb 6.4 oz (74.6 kg)  09/13/21 160 lb (72.6 kg)  08/27/21 160 lb 9.6 oz (72.8 kg)     ASSESSMENT AND PLAN: CAD in native artery Mr. Kiedrowski has new chest pain that feels like sharp pain.  He was working out in [employer] and I suspect that this is related to musculoskeletal pain.  Given his history we will get a Lexiscan Myoview to assess for ischemic disease.  He has no known prior RCA infarct.  Given his CKD, no plans for LHC at this time.    Chronic diastolic heart failure ([name]) He is mildly volume overloaded today.  He will increase lasix to 79m daily for two days then go back to 449mdaily.  BP is well-controlled on carvedilol.  Reduce spironolactone to 12.31m27maily due to fatigue and hypotension.    Fatigue due to exertion He is still very fatigued and his exertional capacity is poor.  He is going to wait on going back to the gym for now.  I suspect that his chest discomfort is mostly from a pulled muscle.  We are getting a stress test as above.  Reduce spironolactone to 12.5 mg to see if raising his blood pressure helps with his energy.    Current medicines are reviewed at length with the patient today.  The patient does not have concerns regarding medicines.  The following changes have been made: none Labs/ tests ordered today include:   Orders Placed This Encounter  Procedures   Cardiac Stress Test: Informed Consent Details: Physician/Practitioner Attestation; Transcribe to consent form and obtain patient signature   MYOCARDIAL PERFUSION IMAGING   EKG 12-Lead   ECHOCARDIOGRAM  COMPLETE     Disposition:   FU with APP in 1 month  I,Mykaella Javier,acting as a scribe for Jeffrey Latch, MD.,have documented all relevant documentation on the behalf of Jeffrey Latch, MD,as directed by  Jeffrey Latch, MD while in the presence of Jeffrey Latch, MD.  I, Butler Oval Linsey, MD have reviewed all documentation for this visit.   The documentation of the exam, diagnosis, procedures, and orders on 09/19/2021 are all accurate and complete.   Signed, Zackry Deines C. Oval Linsey, MD, [facility]  09/19/2021 5:32 PM    [facility]

## 2021-09-19 NOTE — Patient Instructions (Signed)
Medication Instructions:  DECREASE YOUR SPIRONOLACTONE TO 1/2 TABLET DAILY   TAKE FUROSEMIDE 40  MG WHEN YOU GET HOME  TAKE FUROSEMIDE 2 TABLETS TOMORROW   *If you need a refill on your cardiac medications before your next appointment, please call your pharmacy*  Lab Work: NONE  Testing/Procedures: Your physician has requested that you have an echocardiogram. Echocardiography is a painless test that uses sound waves to create images of your heart. It provides your doctor with information about the size and shape of your heart and how well your hearts chambers and valves are working. This procedure takes approximately one hour. There are no restrictions for this procedure.  Your physician has requested that you have a lexiscan myoview. For further information please visit [email or web address]. Please follow instruction sheet, as given.  Follow-Up: At [facility], you and your health needs are our priority.  As part of our continuing mission to provide you with exceptional heart care, we have created designated Provider Care Teams.  These Care Teams include your primary Cardiologist (physician) and Advanced Practice Providers (APPs -  Physician Assistants and Nurse Practitioners) who all work together to provide you with the care you need, when you need it.  We recommend signing up for the patient portal called "MyChart".  Sign up information is provided on this After Visit Summary.  MyChart is used to connect with patients for Virtual Visits (Telemedicine).  Patients are able to view lab/test results, encounter notes, upcoming appointments, etc.  Non-urgent messages can be sent to your provider as well.   To learn more about what you can do with MyChart, go to [email or web address].    Your next appointment:   1 month(s)  The format for your next appointment:   In Person  Provider:   Laurann Montana, NP or Coletta Memos, NP{

## 2021-09-19 NOTE — Telephone Encounter (Signed)
He is being sent to triage. Can we make sure he gets a follow up appointment scheduled soon?  Jeffrey [name]. [name], MD 09/19/2021 8:41 AM

## 2021-09-19 NOTE — Telephone Encounter (Signed)
Spoke with patient and brother, scheduled appointment today at 68

## 2021-09-19 NOTE — Assessment & Plan Note (Signed)
He is still very fatigued and his exertional capacity is poor.  He is going to wait on going back to the gym for now.  I suspect that his chest discomfort is mostly from a pulled muscle.  We are getting a stress test as above.  Reduce spironolactone to 12.5 mg to see if raising his blood pressure helps with his energy.

## 2021-09-19 NOTE — Assessment & Plan Note (Signed)
Mr. Caraway has new chest pain that feels like sharp pain.  He was working out in [employer] and I suspect that this is related to musculoskeletal pain.  Given his history we will get a Lexiscan Myoview to assess for ischemic disease.  He has no known prior RCA infarct.  Given his CKD, no plans for LHC at this time.

## 2021-09-19 NOTE — Telephone Encounter (Signed)
Metformin does not help with water retention. They should stick to 1000mg  daily.   They can take an extra dose of lasix if needed. Please have them come in for an office visit soon. We will need to check blood work. We may need to update his echocardiogram.   Jeffrey [name]. [name], MD 09/19/2021 8:25 AM

## 2021-09-19 NOTE — Assessment & Plan Note (Signed)
He is mildly volume overloaded today.  He will increase lasix to 80mg  daily for two days then go back to 40mg  daily.  BP is well-controlled on carvedilol.  Reduce spironolactone to 12.5mg  daily due to fatigue and hypotension.

## 2021-09-19 NOTE — Telephone Encounter (Signed)
See note

## 2021-09-19 NOTE — Telephone Encounter (Signed)
Spoke with patient and patient brother  Stated chest pain feeling like a pinching pain  Patietn taking 2000mg  metformin since his feet are swelling  Complaining of fatigue  Call was transfer to triage

## 2021-09-19 NOTE — Telephone Encounter (Signed)
Pt refused ED. Pt is scheduled with Skeet Latch at [facility] on 09/19/21 @ 4pm.  Patient Name: Jeffrey [name] [facility] Gender: Male DOB: 03/24/1956 Age: 66 Y 10 M 3 D Return Phone Number: [phone] (Primary), [phone] (Secondary) Address: City/ State/ Zip: [place]   [address] Client [place] [facility] [place] [facility] [place] [facility] [place] [facility] Provider Dimas Chyle- MD Contact Type Call Who Is Calling Patient / Member / Family / Caregiver Call Type Triage / Clinical Relationship To Patient Self Return Phone Number [phone] (Primary) Chief Complaint CHEST PAIN - pain, pressure, heaviness or tightness Reason for Call Symptomatic / Request for Doyline states that he has chest pain and his feet are swelling. Translation No Nurse Assessment Nurse: D'Heur Lucia Gaskins, RN, Adrienne Date/Time (Eastern Time): 09/19/2021 8:40:48 AM Confirm and document reason for call. If symptomatic, describe symptoms. ---Caller states that he has been having "pinching" in his chest around the heart area that has been happening the past couple of days and his feet are swelling. The sensation lasts a few minutes and has happened about 4x in the past 24 hours. The swelling is pitting and is only in the feet. Does the patient have any new or worsening symptoms? ---Yes Will a triage be completed? ---Yes Related visit to physician within the last 2 weeks? ---No Does the PT have any chronic conditions? (i.e. diabetes, asthma, this includes High risk factors for pregnancy, etc.) ---Yes List chronic conditions. ---heart stents, (check MyChart as per pt) Is this a behavioral health or substance abuse call? ---No Guidelines Guideline Title Affirmed Question Affirmed Notes Nurse Date/Time (Eastern Time) Chest Pain [1] Chest pain (or "angina") comes and goes AND [2] is happening more often (increasing in [place], RN, Vincente Liberty 09/19/2021 8:45:45 AM  Guidelines Guideline Title Affirmed Question Affirmed Notes Nurse Date/Time (Eastern Time) frequency) or getting worse (increasing in severity) (Exception: chest pains that last only a few seconds) Disp. Time Eilene Ghazi Time) Disposition Final User 09/19/2021 8:39:11 AM Send to Urgent Queue Windy Canny 09/19/2021 8:52:36 AM Go to ED Now Yes D'Heur Lucia Gaskins, RN, Ebbie Latus Disagree/Comply Disagree Caller Understands Yes PreDisposition Call Doctor Care Advice Given Per Guideline GO TO ED NOW: * Leave now. Drive carefully. CALL EMS IF: * Severe difficulty breathing occurs * Passes out or becomes too weak to stand * You become worse CARE ADVICE given per Chest Pain (Adult) guideline. Comments User: Vincente Liberty, D'Heur Lucia Gaskins, RN Date/Time Eilene Ghazi Time): 09/19/2021 8:47:10 AM Caller has permanent damage to the bottom of his hearty and it does not pump properly. User: Vincente Liberty, D'Heur Lucia Gaskins, RN Date/Time (Eastern Time): 09/19/2021 8:53:35 AM Caller states he is too tired to go to the ED. Referrals GO TO FACILITY REFUSED

## 2021-09-19 NOTE — Telephone Encounter (Signed)
Pt c/o swelling: STAT is pt has developed SOB within 24 hours  If swelling, where is the swelling located? Both feet  How much weight have you gained and in what time span? no  Have you gained 3 pounds in a day or 5 pounds in a week? no  Do you have a log of your daily weights (if so, list)? Hasn't changed  Are you currently taking a fluid pill? yes  Are you currently SOB? no  Have you traveled recently? No Brother states he has a pinching feeling in his chest, it comes and goes for the past week weeks, has been feeling tired with no energy.

## 2021-09-19 NOTE — Telephone Encounter (Signed)
Spoke with patient and scheduled visit for today at 4:00 pm

## 2021-09-21 NOTE — Telephone Encounter (Signed)
Spoke with patient. Patient has Echo and Myocardial perfusion schedule on 10/04/2021 Advise to continue with  Metformin at 100mg  daily  can take an extra dose of Lasix if needed  Patient  verbalized understanding Stated today BP at 10:00 112/56 HR 88  Fasting glucose 88

## 2021-09-23 ENCOUNTER — Telehealth: Payer: Self-pay

## 2021-09-23 ENCOUNTER — Other Ambulatory Visit: Payer: Self-pay

## 2021-09-23 DIAGNOSIS — Z794 Long term (current) use of insulin: Secondary | ICD-10-CM

## 2021-09-23 DIAGNOSIS — E1142 Type 2 diabetes mellitus with diabetic polyneuropathy: Secondary | ICD-10-CM

## 2021-09-23 MED ORDER — BLOOD GLUCOSE METER KIT: PACK | 0 refills | Status: AC

## 2021-09-23 NOTE — Telephone Encounter (Signed)
Can we please have them schedule an appointment soon if his sugars are still not controlled? He needs to make sure that he is getting plenty of fluids.  Jeffrey [name]. [name], MD 09/23/2021 10:40 AM

## 2021-09-23 NOTE — Telephone Encounter (Signed)
Pt verifed DOB, pt confirmed glucose meter wasn't correct. Picking up a new meter today but scheduling an appt for 1 wk for f/u.

## 2021-09-23 NOTE — Telephone Encounter (Signed)
Rx sent in

## 2021-09-23 NOTE — Telephone Encounter (Signed)
Patient stated he went to Diabetic coma yesterday due to low blood sugar  and EMS was there for 3 hours to get it under control. Patient is doing fine at this time. FYI

## 2021-09-23 NOTE — Telephone Encounter (Signed)
MEDICATION: Onetouch verio meter   PHARMACY:  [id number] - [place],  - [address] AT [place] Phone:  [phone]  Fax:  [phone]      Comments:   **Let patient know to contact pharmacy at the end of the day to make sure medication is ready. **  ** Please notify patient to allow 48-72 hours to process**  **Encourage patient to contact the pharmacy for refills or they can request refills through [facility]**

## 2021-09-25 ENCOUNTER — Telehealth (HOSPITAL_COMMUNITY): Payer: Self-pay | Admitting: Cardiovascular Disease

## 2021-09-25 ENCOUNTER — Encounter: Payer: Self-pay | Admitting: Family Medicine

## 2021-09-25 NOTE — Telephone Encounter (Signed)
Will forward to Dr B[place] so she will be aware ?

## 2021-09-25 NOTE — Telephone Encounter (Signed)
Patient called and cancelled echocardiogram and Myoview for reason below:  09/25/21 patient unable to due at this time due to his diabetes being out of range. LBW He will call us back at a later date to reschedule. 9:04   Order will be removed from the echo/nuc WQ. When patient calls back to reschedule we will reinstate the order.

## 2021-09-26 NOTE — Telephone Encounter (Signed)
Noted. They can increase his lantus by a couple of units if needed if he is still having elevated readings.  Jeffrey [name]. [name], MD 09/26/2021 1:53 PM

## 2021-09-26 NOTE — Telephone Encounter (Signed)
Glucose been running good today  Patient has OV on Monday 09/30/2021

## 2021-09-30 ENCOUNTER — Encounter: Payer: Self-pay | Admitting: Family Medicine

## 2021-09-30 ENCOUNTER — Other Ambulatory Visit: Payer: Self-pay | Admitting: *Deleted

## 2021-09-30 ENCOUNTER — Ambulatory Visit: Payer: HMO | Admitting: Family Medicine

## 2021-09-30 NOTE — Telephone Encounter (Signed)
We need to get blood work to make sure it is safe for him to be on metformin 2000mg  total daily. Please ask them to make sure he is getting plenty of fluids and have them come in this week to check a CMET.  [name]. [name], MD 09/30/2021 1:02 PM

## 2021-09-30 NOTE — Telephone Encounter (Signed)
Spoke with patient and patient brother  Advise to follow sig from patient medication, verbalized understanding   Patient want to know if is ok to continue with 2000mg  metformin daily? Stated glucose been running good this morning  Had to cancelled office visit today due to house pet been sick and going to put him to sleep due to illness today

## 2021-09-30 NOTE — Telephone Encounter (Signed)
Patient aware have lab appointee for Monday

## 2021-10-02 ENCOUNTER — Other Ambulatory Visit: Payer: Self-pay | Admitting: Family Medicine

## 2021-10-04 ENCOUNTER — Other Ambulatory Visit (HOSPITAL_COMMUNITY): Payer: HMO

## 2021-10-04 ENCOUNTER — Encounter (HOSPITAL_COMMUNITY): Payer: HMO

## 2021-10-06 ENCOUNTER — Encounter: Payer: Self-pay | Admitting: Family Medicine

## 2021-10-07 ENCOUNTER — Other Ambulatory Visit: Payer: HMO

## 2021-10-07 NOTE — Telephone Encounter (Signed)
FYI

## 2021-10-11 ENCOUNTER — Other Ambulatory Visit: Payer: Self-pay | Admitting: Family Medicine

## 2021-10-16 DIAGNOSIS — H2513 Age-related nuclear cataract, bilateral: Secondary | ICD-10-CM | POA: Diagnosis not present

## 2021-10-16 DIAGNOSIS — H31093 Other chorioretinal scars, bilateral: Secondary | ICD-10-CM | POA: Diagnosis not present

## 2021-10-16 DIAGNOSIS — E113513 Type 2 diabetes mellitus with proliferative diabetic retinopathy with macular edema, bilateral: Secondary | ICD-10-CM | POA: Diagnosis not present

## 2021-10-16 DIAGNOSIS — H3582 Retinal ischemia: Secondary | ICD-10-CM | POA: Diagnosis not present

## 2021-10-25 ENCOUNTER — Other Ambulatory Visit (INDEPENDENT_AMBULATORY_CARE_PROVIDER_SITE_OTHER): Payer: HMO

## 2021-10-25 DIAGNOSIS — E1142 Type 2 diabetes mellitus with diabetic polyneuropathy: Secondary | ICD-10-CM

## 2021-10-25 DIAGNOSIS — Z794 Long term (current) use of insulin: Secondary | ICD-10-CM

## 2021-10-25 LAB — CBC
HCT: 35.5 % — ABNORMAL LOW (ref 39.0–52.0)
Hemoglobin: 12.2 g/dL — ABNORMAL LOW (ref 13.0–17.0)
MCHC: 34.4 g/dL (ref 30.0–36.0)
MCV: 92.2 fl (ref 78.0–100.0)
Platelets: 415 10*3/uL — ABNORMAL HIGH (ref 150.0–400.0)
RBC: 3.86 Mil/uL — ABNORMAL LOW (ref 4.22–5.81)
RDW: 13.3 % (ref 11.5–15.5)
WBC: 10 10*3/uL (ref 4.0–10.5)

## 2021-10-25 LAB — COMPREHENSIVE METABOLIC PANEL
ALT: 17 U/L (ref 0–53)
AST: 17 U/L (ref 0–37)
Albumin: 4.2 g/dL (ref 3.5–5.2)
Alkaline Phosphatase: 65 U/L (ref 39–117)
BUN: 61 mg/dL — ABNORMAL HIGH (ref 6–23)
CO2: 26 mEq/L (ref 19–32)
Calcium: 9 mg/dL (ref 8.4–10.5)
Chloride: 103 mEq/L (ref 96–112)
Creatinine, Ser: 2.37 mg/dL — ABNORMAL HIGH (ref 0.40–1.50)
GFR: 27.76 mL/min — ABNORMAL LOW (ref >60.00)
Glucose, Bld: 129 mg/dL — ABNORMAL HIGH (ref 70–99)
Potassium: 3.7 mEq/L (ref 3.5–5.1)
Sodium: 139 mEq/L (ref 135–145)
Total Bilirubin: 0.4 mg/dL (ref 0.2–1.2)
Total Protein: 6.8 g/dL (ref 6.0–8.3)

## 2021-10-28 NOTE — Progress Notes (Signed)
Please inform patient of the following: ? ?His labs are overall stable but his kidney numbers are slightly higher than last time. ? ?We can discuss more at his upcoming office visit but per guidelines we should try to wean him off the metformin. Recommend that he stop the metformin and it is ok for him to increase his dose of lantus if needed if his sugars start to run high again.  ? ?He should make sure that he is getting plenty of fluids and staying hydrated - his numbers make it look like he is pretty dehydrated.

## 2021-10-29 ENCOUNTER — Other Ambulatory Visit: Payer: Self-pay | Admitting: Family Medicine

## 2021-10-29 ENCOUNTER — Ambulatory Visit (INDEPENDENT_AMBULATORY_CARE_PROVIDER_SITE_OTHER): Payer: HMO | Admitting: Family Medicine

## 2021-10-29 ENCOUNTER — Encounter: Payer: Self-pay | Admitting: Family Medicine

## 2021-10-29 VITALS — BP 130/68 | HR 75 | Temp 97.3°F | Ht 68.0 in | Wt 161.8 lb

## 2021-10-29 DIAGNOSIS — I5032 Chronic diastolic (congestive) heart failure: Secondary | ICD-10-CM

## 2021-10-29 DIAGNOSIS — E1142 Type 2 diabetes mellitus with diabetic polyneuropathy: Secondary | ICD-10-CM | POA: Diagnosis not present

## 2021-10-29 DIAGNOSIS — I152 Hypertension secondary to endocrine disorders: Secondary | ICD-10-CM

## 2021-10-29 DIAGNOSIS — N1832 Chronic kidney disease, stage 3b: Secondary | ICD-10-CM | POA: Diagnosis not present

## 2021-10-29 DIAGNOSIS — I10 Essential (primary) hypertension: Secondary | ICD-10-CM

## 2021-10-29 DIAGNOSIS — E1159 Type 2 diabetes mellitus with other circulatory complications: Secondary | ICD-10-CM | POA: Diagnosis not present

## 2021-10-29 DIAGNOSIS — Z794 Long term (current) use of insulin: Secondary | ICD-10-CM | POA: Diagnosis not present

## 2021-10-29 LAB — POCT GLYCOSYLATED HEMOGLOBIN (HGB A1C): Hemoglobin A1C: 7.5 % — AB (ref 4.0–5.6)

## 2021-10-29 NOTE — Patient Instructions (Addendum)
It was very nice to see you today! ? ?Please decrease your metformin to one pill daily for the next several days. ? ?Please send me a message in a few days to let me know how your sugars are looking and we can adjust the dose of Lantus as needed. ? ?Take care, ?Dr [name] ? ?PLEASE NOTE: ? ?If you had any lab tests please let us know if you have not heard back within a few days. You may see your results on mychart before we have a chance to review them but we will give you a call once they are reviewed by [place]. If we ordered any referrals today, please let us know if you have not heard from their office within the next week.  ? ?Please try these tips to maintain a healthy lifestyle: ? ?Eat at least 3 REAL meals and 1-2 snacks per day.  Aim for no more than 5 hours between eating.  If you eat breakfast, please do so within one hour of getting up.  ? ?Each meal should contain half fruits/vegetables, one quarter protein, and one quarter carbs (no bigger than a computer mouse) ? ?Cut down on sweet beverages. This includes juice, soda, and sweet tea.  ? ?Drink at least 1 glass of water with each meal and aim for at least 8 glasses per day ? ?Exercise at least 150 minutes every week.   ?

## 2021-10-29 NOTE — Assessment & Plan Note (Signed)
GFR 27 on recent c-Met.  Has had CKD since having COVID-pneumonia a couple of years ago.  We will avoid nephrotoxic medications.  Encouraged good hydration.  We will recheck labs again in a few weeks. ?

## 2021-10-29 NOTE — Progress Notes (Signed)
? ?  Jeffrey [name] is a 66 y.o. male who presents today for an office visit. ? ?Assessment/Plan:  ?Chronic Problems Addressed Today: ?T2DM (type 2 diabetes mellitus) ([place]) ?A1c well controlled at 7.5.  We had a lengthy discussion with patient and his brother today regarding his treatment plan.  His GFR was 27 on his most recent c-Met.  We discussed the need to stop metformin.  They were agreeable. They would like to slowly wean off.  We will go to 500 mg daily for the next several days and they will check with me via MyChart.  We can adjust his dose of Lantus as needed.  We will need to stop metformin completely over the next few weeks. ? ?We discussed starting Januvia however he elected not to start due to cost concerns.  He hasavoided GLP's due concern for diabetic retinopathy. ? ?CKD (chronic kidney disease) stage 3, GFR 30-59 ml/min ([name]) ?GFR 27 on recent c-Met.  Has had CKD since having COVID-pneumonia a couple of years ago.  We will avoid nephrotoxic medications.  Encouraged good hydration.  We will recheck labs again in a few weeks. ? ?Chronic diastolic heart failure ([place]) ?No signs of volume overload.  Continue regimen per cardiology with Coreg 3.125 mg twice daily, Lasix 40 mg daily and spironolactone 12.5 mg daily. ? ?  ?Subjective:  ?HPI: ? ?See A/P for status of chronic conditions. ? ?   ?  ?Objective:  ?Physical Exam: ?BP 130/68   Pulse 75   Temp (!) 97.3 ?F (36.3 ?C) (Temporal)   Ht $R'5\' 8"'rh$  (1.727 m)   Wt 161 lb 12.8 oz (73.4 kg)   SpO2 100%   BMI 24.60 kg/m?   ?Gen: No acute distress, resting comfortably ?CV: Regular rate and rhythm with no murmurs appreciated ?Pulm: Normal work of breathing, clear to auscultation bilaterally with no crackles, wheezes, or rhonchi ?Neuro: Grossly normal, moves all extremities ?Psych: Normal affect and thought content ? ?Time Spent: ?45 minutes of total time was spent on the date of the encounter performing the following actions: chart review prior to seeing the  patient, obtaining history, performing a medically necessary exam, counseling on the treatment plan including need to stop metformin placing orders, and documenting in our EHR.  ? ? ?   ? ?[name]. [name], MD ?10/29/2021 11:34 AM  ?

## 2021-10-29 NOTE — Assessment & Plan Note (Signed)
No signs of volume overload.  Continue regimen per cardiology with Coreg 3.125 mg twice daily, Lasix 40 mg daily and spironolactone 12.5 mg daily. ?

## 2021-10-29 NOTE — Telephone Encounter (Signed)
Pt called again wanting to know if he can try [name] again? He has them already just unsure if he can still take the 30 of Lantus with the Farxiga in the morning. Please advise  ?

## 2021-10-29 NOTE — Assessment & Plan Note (Signed)
A1c well controlled at 7.5.  We had a lengthy discussion with patient and his brother today regarding his treatment plan.  His GFR was 27 on his most recent c-Met.  We discussed the need to stop metformin.  They were agreeable. They would like to slowly wean off.  We will go to 500 mg daily for the next several days and they will check with me via MyChart.  We can adjust his dose of Lantus as needed.  We will need to stop metformin completely over the next few weeks. ? ?We discussed starting Januvia however he elected not to start due to cost concerns.  He hasavoided GLP's due concern for diabetic retinopathy. ?

## 2021-10-30 ENCOUNTER — Encounter: Payer: Self-pay | Admitting: Family Medicine

## 2021-10-30 NOTE — Telephone Encounter (Signed)
Rosine Door has already called them this morning but yes it is ok to go back to [place]. They need to stop metformin. Would like for them to message [place] in a few days to let us know how his sugars are looking. ? ?[name]. [name], MD ?10/30/2021 8:00 AM  ? ?

## 2021-10-30 NOTE — Telephone Encounter (Signed)
Contacted patient and advised Dr [name] recommendations. Pt will keep [place] posted on blood sugars in the next few weeks.  ?

## 2021-10-31 ENCOUNTER — Telehealth: Payer: Self-pay | Admitting: Family Medicine

## 2021-10-31 ENCOUNTER — Other Ambulatory Visit: Payer: Self-pay

## 2021-10-31 MED ORDER — DAPAGLIFLOZIN PROPANEDIOL 10 MG PO TABS: 10.0000 mg | ORAL_TABLET | Freq: Every day | ORAL | 0 refills | Status: DC

## 2021-10-31 NOTE — Telephone Encounter (Signed)
Please see note and advise  

## 2021-10-31 NOTE — Telephone Encounter (Signed)
See pt advise request notes ?

## 2021-10-31 NOTE — Telephone Encounter (Signed)
Can we call and check on him? Those readings are mostly ok. He should stick with just '10mg'$  of farxiga. He can take the novolog with farxiga if needed. He can increase his lantus to 33 units in the morning. They should check in with [place] early next week. ? ?[name]. [name], MD ?10/31/2021 10:46 AM  ? ?

## 2021-10-31 NOTE — Telephone Encounter (Signed)
Called pt and verified DOB spoke with Pt and Bill; went over Dr [name] recommendations and pt verbalized understanding. Also, advised will need a new Rx sent in for 10 mg Farxiga and he will take 2) '5mg'$  tablets daily until he runs out. Will message or let us know next week how things are going.  ?

## 2021-10-31 NOTE — Telephone Encounter (Signed)
Pt called in asking to speak with Kela. He stated he wanted to discuss something with her.  ?

## 2021-11-13 ENCOUNTER — Telehealth: Payer: Self-pay

## 2021-11-13 ENCOUNTER — Other Ambulatory Visit: Payer: Self-pay

## 2021-11-13 DIAGNOSIS — N1832 Chronic kidney disease, stage 3b: Secondary | ICD-10-CM

## 2021-11-13 NOTE — Telephone Encounter (Signed)
Orders placed and lab appt scheduled  ?

## 2021-11-13 NOTE — Telephone Encounter (Signed)
Please advise 

## 2021-11-13 NOTE — Telephone Encounter (Signed)
Patient is requesting call back.   Is requesting lab orders to be placed to check his kidney function before his next office visit.

## 2021-11-13 NOTE — Telephone Encounter (Signed)
Ok to place order for [employer]. ? ?[name]. [name], MD ?11/13/2021 11:42 AM  ? ?

## 2021-11-20 ENCOUNTER — Other Ambulatory Visit: Payer: Self-pay | Admitting: Family Medicine

## 2021-11-22 ENCOUNTER — Other Ambulatory Visit (INDEPENDENT_AMBULATORY_CARE_PROVIDER_SITE_OTHER): Payer: HMO

## 2021-11-22 DIAGNOSIS — N1832 Chronic kidney disease, stage 3b: Secondary | ICD-10-CM

## 2021-11-22 LAB — BASIC METABOLIC PANEL
BUN: 57 mg/dL — ABNORMAL HIGH (ref 6–23)
CO2: 21 mEq/L (ref 19–32)
Calcium: 9 mg/dL (ref 8.4–10.5)
Chloride: 106 mEq/L (ref 96–112)
Creatinine, Ser: 2.29 mg/dL — ABNORMAL HIGH (ref 0.40–1.50)
GFR: 28.91 mL/min — ABNORMAL LOW (ref >60.00)
Glucose, Bld: 143 mg/dL — ABNORMAL HIGH (ref 70–99)
Potassium: 4.8 mEq/L (ref 3.5–5.1)
Sodium: 136 mEq/L (ref 135–145)

## 2021-11-26 ENCOUNTER — Ambulatory Visit (INDEPENDENT_AMBULATORY_CARE_PROVIDER_SITE_OTHER): Payer: HMO | Admitting: Family Medicine

## 2021-11-26 VITALS — BP 114/70 | HR 69 | Temp 97.5°F | Ht 68.0 in | Wt 160.8 lb

## 2021-11-26 DIAGNOSIS — M25529 Pain in unspecified elbow: Secondary | ICD-10-CM

## 2021-11-26 DIAGNOSIS — Z794 Long term (current) use of insulin: Secondary | ICD-10-CM

## 2021-11-26 DIAGNOSIS — I152 Hypertension secondary to endocrine disorders: Secondary | ICD-10-CM | POA: Diagnosis not present

## 2021-11-26 DIAGNOSIS — E1142 Type 2 diabetes mellitus with diabetic polyneuropathy: Secondary | ICD-10-CM | POA: Diagnosis not present

## 2021-11-26 DIAGNOSIS — E1159 Type 2 diabetes mellitus with other circulatory complications: Secondary | ICD-10-CM

## 2021-11-26 LAB — POCT GLYCOSYLATED HEMOGLOBIN (HGB A1C): Hemoglobin A1C: 7.8 % — AB (ref 4.0–5.6)

## 2021-11-26 MED ORDER — METHYLPREDNISOLONE ACETATE 40 MG/ML IJ SUSP
40.0000 mg | Freq: Once | INTRAMUSCULAR | Status: AC
  Administered 2021-11-26: 40 mg via INTRA_ARTICULAR

## 2021-11-26 NOTE — Patient Instructions (Signed)
It was very nice to see you today! ? ?Your kidney numbers are looking better and your blood sugar is stable.  No medication changes today. ? ?You have tennis elbow.  We gave you a cortisone shot today.  Let us know if not improving. ? ?Take care, ?Dr [name] ? ?PLEASE NOTE: ? ?If you had any lab tests please let us know if you have not heard back within a few days. You may see your results on mychart before we have a chance to review them but we will give you a call once they are reviewed by [place]. If we ordered any referrals today, please let us know if you have not heard from their office within the next week.  ? ?Please try these tips to maintain a healthy lifestyle: ? ?Eat at least 3 REAL meals and 1-2 snacks per day.  Aim for no more than 5 hours between eating.  If you eat breakfast, please do so within one hour of getting up.  ? ?Each meal should contain half fruits/vegetables, one quarter protein, and one quarter carbs (no bigger than a computer mouse) ? ?Cut down on sweet beverages. This includes juice, soda, and sweet tea.  ? ?Drink at least 1 glass of water with each meal and aim for at least 8 glasses per day ? ?Exercise at least 150 minutes every week.   ?

## 2021-11-26 NOTE — Progress Notes (Signed)
? ?Jeffrey [name] is a 66 y.o. male who presents today for an office visit. ? ?Assessment/Plan:  ?New/Acute Problems: ?Right lateral epicondylitis ?No red flags.  We discussed treatment options including trial of physical therapy.  He elected to proceed with steroid injection.  This was performed today.  See below procedure note.  She tolerated well.  We discussed home exercises and counterforce bracing as well. ? ?Chronic Problems Addressed Today: ?T2DM (type 2 diabetes mellitus) ([place]) ?A1c stable 7.8.  He has been doing well with Farxiga 10 mg daily.  We will continue this.  Continue Tresiba 33 units daily with NovoLog as needed for high blood sugars.  Discussed lifestyle modifications.  We will recheck in 3 months. ? ?Hypertension associated with diabetes ([place]) ?At goal today on Coreg 3.125 mg twice daily and spironolactone 25 mg daily. ? ? ?  ?Subjective:  ?HPI: ? ?Patient here to follow-up. Last saw him about a month ago. He is here with his brother today. His A1c was 7.8 at office today. He is doing well since last visit. He was stopped on Metformin due to side effects. He was having side effects with frequent urination and constipation. This has resolved. He was then started on Farxiga about a month ago. He is currently taking Farxiga 10 mg daily. He has been tolerating his medication without any side effects. His sugar at home has been stable.  ? ?He also complain of right arm pain. Symptoms started about a month ago. He notes he fell down from a bed. Landed on right side. Had pain in the elbow since then. He notes pain radiates down the arm. He is unable to sleep on his right arm. He has tried [employer] with no relief. He has been feeling some pins and needles sensation. He is not sure what movement make this worse. He would like to have cortisone shot applied for his pain today. No other aggravating or precipitating factors. Denies pain in the left arm.  ? ? ?   ?  ?Objective:  ?Physical Exam: ?BP 114/70    Pulse 69   Temp (!) 97.5 ?F (36.4 ?C) (Temporal)   Ht '5\' 8"'$  (1.727 m)   Wt 160 lb 12.8 oz (72.9 kg)   SpO2 99%   BMI 24.45 kg/m?   ?Gen: No acute distress, resting comfortably ?CV: Regular rate and rhythm with no murmurs appreciated ?Pulm: Normal work of breathing, clear to auscultation bilaterally with no crackles, wheezes, or rhonchi ?MSK:  ?- right Arm: No deformities.  Tenderness palpation at lateral epicondyle.  Pain elicited with resisted extension of the third digit. ?Neuro: Grossly normal, moves all extremities ?Psych: Normal affect and thought content ? ?Procedure Note: ?Verbal consent was obtained after discussing risk and benefits as well as alternatives.  His right lateral epicondyle was marked at the point of maximal tenderness.  Area was cleansed with alcohol swab x3.  Topical ethyl chloride was applied for anesthesia.  A mixture of 2 mL of 1% lidocaine without epinephrine and 1 mL of '40mg'$ /cc Depo-Medrol was then injected into the point of maximal tenderness.  Needle was withdrawn and sterile bandage was placed.  Minimal blood loss.  He tolerated well.  No complications. ? ?   ? ? ?I,Savera Zaman,acting as a Education administrator for Dimas Chyle, MD.,have documented all relevant documentation on the behalf of Dimas Chyle, MD,as directed by  Dimas Chyle, MD while in the presence of Dimas Chyle, MD.  ? ?I, Dimas Chyle, MD, have reviewed all documentation  for this visit. The documentation on 11/26/21 for the exam, diagnosis, procedures, and orders are all accurate and complete. ? ?[name]. [name], MD ?11/26/2021 11:41 AM  ? ?

## 2021-11-26 NOTE — Assessment & Plan Note (Signed)
A1c stable 7.8.  He has been doing well with Farxiga 10 mg daily.  We will continue this.  Continue Tresiba 33 units daily with NovoLog as needed for high blood sugars.  Discussed lifestyle modifications.  We will recheck in 3 months. ?

## 2021-11-26 NOTE — Assessment & Plan Note (Signed)
At goal today on Coreg 3.125 mg twice daily and spironolactone 25 mg daily. 

## 2021-12-03 ENCOUNTER — Encounter: Payer: Self-pay | Admitting: Cardiovascular Disease

## 2021-12-19 ENCOUNTER — Ambulatory Visit (HOSPITAL_BASED_OUTPATIENT_CLINIC_OR_DEPARTMENT_OTHER): Payer: HMO | Admitting: Cardiovascular Disease

## 2021-12-26 ENCOUNTER — Ambulatory Visit (HOSPITAL_BASED_OUTPATIENT_CLINIC_OR_DEPARTMENT_OTHER): Payer: HMO | Admitting: Cardiovascular Disease

## 2022-01-10 ENCOUNTER — Ambulatory Visit (HOSPITAL_BASED_OUTPATIENT_CLINIC_OR_DEPARTMENT_OTHER): Payer: HMO | Admitting: Orthopaedic Surgery

## 2022-01-10 DIAGNOSIS — G629 Polyneuropathy, unspecified: Secondary | ICD-10-CM | POA: Diagnosis not present

## 2022-01-10 NOTE — Progress Notes (Signed)
Chief Complaint: Lower leg neuropathy     History of Present Illness:    Jeffrey [name] is a 66 y.o. male presents today with ongoing issues with his bilateral feet and ankles.  He states that he was previously diagnosed with neuropathy in bilateral feet MRI limb.  He states for the last several years he has had difficulty with ambulation with weakness in the feet and ankles.  He has previously been seen by neurologist although he no longer has had follow-up with a neurologist here.  He was previously given an orthotic trainer type device for his feet which caused him significantly increased pain in his feet with new ulcerations.  Here today for further assessment    Surgical History:   None  PMH/PSH/Family History/Social History/Meds/Allergies:    Past Medical History:  Diagnosis Date   Cardiac arrest ([place]) 05/22/2016   Cataract    CHF (congestive heart failure) ([name])    CKD (chronic kidney disease) stage 3, GFR 30-59 ml/min ([name]) 05/28/2021   Diabetes mellitus without complication ([place])    type 2   Diabetic retinopathy ([place])    Hyperlipidemia    Hypertension    Memory loss    mild   Myocardial infarct ([place]) 2105   Retinopathy    Both eyes   S/P primary angioplasty with coronary stent 05/22/2016   Vitamin B12 deficiency    Vitreous hemorrhage of left eye ([place])    proliferative diabetic retinopathy   Past Surgical History:  Procedure Laterality Date   ANGIOPLASTY     2015   COLONOSCOPY     multiple   EYE SURGERY     PARS PLANA VITRECTOMY Left 03/15/2020   Procedure: PARS PLANA VITRECTOMY WITH 25 GAUGE;  Surgeon: [name], MD;  Location: [facility];  Service: Ophthalmology;  Laterality: Left;   PHOTOCOAGULATION WITH LASER Left 03/15/2020   Procedure: PHOTOCOAGULATION WITH LASER; INTRAVITREAL INJECTION OF AVASTIN;  Surgeon: [name], MD;  Location: [facility];  Service: Ophthalmology;  Laterality: Left;   REFRACTIVE SURGERY     UPPER  GI ENDOSCOPY     several   VITRECTOMY     Social History   Socioeconomic History   Marital status: Single    Spouse name: Not on file   Number of children: Not on file   Years of education: Not on file   Highest education level: Not on file  Occupational History   Not on file  Tobacco Use   Smoking status: Never   Smokeless tobacco: Never  Vaping Use   Vaping Use: Never used  Substance and Sexual Activity   Alcohol use: Not Currently    Comment: occ   Drug use: No   Sexual activity: Not Currently  Other Topics Concern   Not on file  Social History Narrative   Not on file   Social Determinants of Health   Financial Resource Strain: Not on file  Food Insecurity: Not on file  Transportation Needs: Not on file  Physical Activity: Not on file  Stress: Not on file  Social Connections: Not on file   Family History  Problem Relation Age of Onset   Alzheimer's disease Mother    Heart disease Father    Peripheral Artery Disease Father    Heart disease Brother    Colon polyps Neg Hx  Prostate cancer Neg Hx    Rectal cancer Neg Hx    Esophageal cancer Neg Hx    Allergies  Allergen Reactions   Codeine Nausea And Vomiting   Farxiga [Dapagliflozin]     Constipation   Losartan    Penicillins Rash    Did it involve swelling of the face/tongue/throat, SOB, or low BP? no Did it involve sudden or severe rash/hives, skin peeling, or any reaction on the inside of your mouth or nose? yes Did you need to seek medical attention at a hospital or doctor's office? yes When did it last happen?    childhood   If all above answers are "NO", may proceed with cephalosporin use.    Current Outpatient Medications  Medication Sig Dispense Refill   acetaminophen (TYLENOL) 325 MG tablet Take 325-650 mg by mouth every 6 (six) hours as needed for moderate pain.     aspirin EC 81 MG tablet Take 81 mg by mouth daily.     atorvastatin (LIPITOR) 80 MG tablet TAKE 1 TABLET BY MOUTH EVERY DAY  90 tablet 1   blood glucose meter kit and supplies Dispense based on patient and insurance preference. Use up to four times daily as directed. (FOR ICD-10 E10.9, E11.9). 1 each 0   Blood Glucose Monitoring Suppl (ONETOUCH VERIO IQ SYSTEM) w/Device KIT 1 kit by Does not apply route 4 (four) times daily. 1 kit 0   carvedilol (COREG) 3.125 MG tablet TAKE 1 TABLET BY MOUTH 2 TIMES DAILY WITH MEAL 180 tablet 1   FARXIGA 10 MG TABS tablet TAKE 1 TABLET BY MOUTH DAILY BEFORE BREAKFAST. 30 tablet 2   furosemide (LASIX) 40 MG tablet TAKE 1 TABLET BY MOUTH EVERY DAY 90 tablet 1   gentamicin (GARAMYCIN) 0.3 % ophthalmic solution      gentamicin-prednisoLONE 0.3-1 % ophthalmic drops 1 drop 2 (two) times daily.     Insulin Pen Needle (PEN NEEDLES) 33G X 4 MM MISC 1 application by Does not apply route in the morning, at noon, in the evening, and at bedtime. 300 each prn   Insulin Syringe-Needle U-100 (B-D INS SYR HALF-UNIT .3CC/31G) 31G X 5/16" 0.3 ML MISC Use 6 times a day 200 each 11   LANTUS SOLOSTAR 100 UNIT/ML Solostar Pen INJECT 30 UNITS INTO THE SKIN DAILY 15 mL 1   meclizine (ANTIVERT) 25 MG tablet Take 1 tablet (25 mg total) by mouth 3 (three) times daily as needed for dizziness. 30 tablet 0   NOVOLOG 100 UNIT/ML injection USE 3X DAILY AS NEEDED FOR HIGH BLOOD SUGARS. 5 UNITS FOR SUGAR >200, 10 UNITS >300, 15 UNITS 400+ 10 mL 5   ofloxacin (OCUFLOX) 0.3 % ophthalmic solution Place 1 drop into the left eye 4 (four) times daily.     OneTouch Delica Lancets 33G MISC Use to check blood sugar 4 times per day 200 each 11   ONETOUCH VERIO test strip CHECK BLOOD SUGAR 4 TIMES PER DAY 200 strip 9   spironolactone (ALDACTONE) 25 MG tablet Take 12.5 mg by mouth daily.     triamcinolone cream (KENALOG) 0.1 % Apply 1 application topically 2 (two) times daily. 454 g 0   vitamin B-12 (CYANOCOBALAMIN) 1000 MCG tablet Take 1,000 mcg by mouth daily.     No current facility-administered medications for this visit.    No results found.  Review of Systems:   A ROS was performed including pertinent positives and negatives as documented in the HPI.  Physical Exam :  Constitutional: NAD and appears stated age Neurological: Alert and oriented Psych: Appropriate affect and cooperative There were no vitals taken for this visit.   Comprehensive Musculoskeletal Exam:    He has bilateral flatfoot deformity with pain over the medial arch.  He does have fasciculations involving both feet consistent with ongoing neuropathy.  He has a weakened gait with some ataxia.  He walks with a walker at baseline  Imaging:     I personally reviewed and interpreted the radiographs.   Assessment:   66 y.o. male with bilateral lower extremity weakness in the setting of neuropathy.  To this effect I would like to recommend him for neurology consultation to see if there is any type of intervention that can be performed for his neuropathy.  I do believe he would also benefit from some custom orthotics to this effect with plan to refer him to Dr. Amalia Hailey.  I will see him back on an as-needed basis  Plan :    -Return to clinic as needed     I personally saw and evaluated the patient, and participated in the management and treatment plan.  Vanetta Mulders, MD Attending Physician, Orthopedic Surgery  This document was dictated using Dragon voice recognition software. A reasonable attempt at proof reading has been made to minimize errors.

## 2022-01-27 ENCOUNTER — Telehealth: Payer: Self-pay | Admitting: Family Medicine

## 2022-01-27 ENCOUNTER — Encounter: Payer: Self-pay | Admitting: Family Medicine

## 2022-01-27 NOTE — Telephone Encounter (Signed)
Spoke with patient requesting Rx Protonix due to acid reflux  Ok to send Rx?

## 2022-01-27 NOTE — Telephone Encounter (Signed)
Requests new Rx for pantoprazole (PROTONIX) 40 MG tablet   [facility]  [id number] [place] [facility] [address]:  [phone] Fax:  [phone]    Requests to be called once RX has been sent  [phone] -Patient states if unavailable to answer phone please leave a detailed message on voice mail.

## 2022-01-29 ENCOUNTER — Other Ambulatory Visit: Payer: Self-pay | Admitting: *Deleted

## 2022-01-29 MED ORDER — PANTOPRAZOLE SODIUM 40 MG PO TBEC: 40.0000 mg | DELAYED_RELEASE_TABLET | Freq: Every day | ORAL | 3 refills | Status: DC

## 2022-01-29 NOTE — Telephone Encounter (Signed)
Ok with me. Please place any necessary orders. 

## 2022-01-29 NOTE — Telephone Encounter (Signed)
[facility]  Left voice message with information to patient

## 2022-02-14 ENCOUNTER — Encounter: Payer: Self-pay | Admitting: Family Medicine

## 2022-02-14 ENCOUNTER — Telehealth: Payer: Self-pay | Admitting: Family Medicine

## 2022-02-14 NOTE — Telephone Encounter (Signed)
Patient's brother called regarding: Patient's blood sugars at 431. Transferred call to Triage.

## 2022-02-14 NOTE — Telephone Encounter (Signed)
Spoke with patient stated took 5 unit Novolog to bring glucose down  Requesting new scale for Farxiga  At 5:01 Glucose is 94 Has OV on 02/25/2022

## 2022-02-17 NOTE — Telephone Encounter (Signed)
CAn we clarify what "new scale" means? He is on the max dose of farxiga.   Jeffrey [name]. [name], MD 02/17/2022 8:54 AM

## 2022-02-18 NOTE — Telephone Encounter (Signed)
Patient has OV on 02/21/2022

## 2022-02-20 NOTE — Telephone Encounter (Signed)
Patient schedule ov

## 2022-02-21 ENCOUNTER — Other Ambulatory Visit (INDEPENDENT_AMBULATORY_CARE_PROVIDER_SITE_OTHER): Payer: PPO

## 2022-02-21 DIAGNOSIS — E1142 Type 2 diabetes mellitus with diabetic polyneuropathy: Secondary | ICD-10-CM | POA: Diagnosis not present

## 2022-02-21 DIAGNOSIS — Z794 Long term (current) use of insulin: Secondary | ICD-10-CM

## 2022-02-21 LAB — BUN: BUN: 83 mg/dL — ABNORMAL HIGH (ref 6–23)

## 2022-02-21 LAB — HEMOGLOBIN A1C: Hgb A1c MFr Bld: 9.6 % — ABNORMAL HIGH (ref 4.6–6.5)

## 2022-02-21 NOTE — Progress Notes (Signed)
Please inform patient of the following:  His A1c is up a bit since last time.  We can discuss next steps in management at his upcoming office visit.  His BUN is also elevated.  Can we see if we can add on the remainder of the bMET  He should have had a BMET drawn instead of the BUN.

## 2022-02-25 ENCOUNTER — Ambulatory Visit (INDEPENDENT_AMBULATORY_CARE_PROVIDER_SITE_OTHER): Payer: PPO | Admitting: Family Medicine

## 2022-02-25 VITALS — BP 140/82 | HR 83 | Temp 97.4°F | Ht 68.0 in | Wt 156.0 lb

## 2022-02-25 DIAGNOSIS — Z794 Long term (current) use of insulin: Secondary | ICD-10-CM | POA: Diagnosis not present

## 2022-02-25 DIAGNOSIS — E1142 Type 2 diabetes mellitus with diabetic polyneuropathy: Secondary | ICD-10-CM

## 2022-02-25 DIAGNOSIS — N1832 Chronic kidney disease, stage 3b: Secondary | ICD-10-CM | POA: Diagnosis not present

## 2022-02-25 MED ORDER — LANTUS SOLOSTAR 100 UNIT/ML ~~LOC~~ SOPN: 34.0000 [IU] | PEN_INJECTOR | Freq: Every day | SUBCUTANEOUS | 1 refills | Status: DC

## 2022-02-25 NOTE — Progress Notes (Signed)
   Jeffrey [name] is a 66 y.o. male who presents today for an office visit.  Assessment/Plan:  Chronic Problems Addressed Today: T2DM (type 2 diabetes mellitus) ([place]) Had extensive discussion with patient and his brother regarding his diabetes treatment.  They are concerned the [name] is not working well for him.  It is also possible this could be contributing some to his dehydration.  We will stop this today.  We also reiterated that we cannot restart metformin due to his kidney function currently.  We will proceed with managing his sugars via insulin alone at this point.  We had discussed GLP-1 agonist in the past however he would like to hold off on this due to concern for diabetic retinopathy.  They will increase their Lantus to 34 units daily this morning.  Instructed them to increase by 1 unit daily every day that his fasting sugars greater than 100.  They will also take NovoLog 5 units after meals for any sugar readings greater than 200.  They will follow-up with me in a week.  We will titrate his insulin as needed.  We can rediscuss addition of GLP-1 agonist at some point in the future if his sugars are still not at goal with above.  CKD (chronic kidney disease) stage 3, GFR 30-59 ml/min ([name]) We are avoiding nephrotoxic medications.  Need to avoid metformin for now.  GFR 29 on most recent metabolic panel.  We can recheck at next office visit.     Subjective:  HPI:  See A/P for status of chronic conditions.  Is here for diabetes follow-up.  He had an A1c checked last week and 9.4.  His fasting sugars have been in the 100s to 150 so he is having several postprandial readings into the 200s and 300s.  He has been compliant with his [name] and Lantus.  Is also been giving himself NovoLog as needed.  He is concerned the [name] is not working well for him.  Is causing constipation and frequent urination.       Objective:  Physical Exam: BP 140/82   Pulse 83   Temp (!) 97.4 F  (36.3 C) (Temporal)   Ht '5\' 8"'$  (1.727 m)   Wt 156 lb (70.8 kg)   SpO2 99%   BMI 23.72 kg/m   Gen: No acute distress, resting comfortably CV: Regular rate and rhythm with no murmurs appreciated Pulm: Normal work of breathing, clear to auscultation bilaterally with no crackles, wheezes, or rhonchi Neuro: Grossly normal, moves all extremities Psych: Normal affect and thought content  Time Spent: 50 minutes of total time was spent on the date of the encounter performing the following actions: chart review prior to seeing the patient, obtaining history, performing a medically necessary exam, counseling on the treatment plan including medication changes, placing orders, and documenting in our EHR.        [name]. [name], MD 02/25/2022 10:14 AM

## 2022-02-25 NOTE — Patient Instructions (Signed)
It was very nice to see you today!  STOP taking the [place].  We we will increase your dose of Lantus.  Please take 34 units of Lantus this morning.  Please check your blood sugar every morning before you eat.  If your fasting sugar is above 100 please increase your dose of Lantus by 1 unit.  Continue this process until your fasting sugars are less than 100.  You should check your sugar 20 to 30 minutes after you eat.  If your sugar is more than 200, then give yourself 5 units of NovoLog.  You can do this 3-4 times per day.  I will see you back in 1 week.  Please come back sooner if needed.  Take care, Dr [name]  PLEASE NOTE:  If you had any lab tests please let us know if you have not heard back within a few days. You may see your results on mychart before we have a chance to review them but we will give you a call once they are reviewed by [place]. If we ordered any referrals today, please let us know if you have not heard from their office within the next week.   Please try these tips to maintain a healthy lifestyle:  Eat at least 3 REAL meals and 1-2 snacks per day.  Aim for no more than 5 hours between eating.  If you eat breakfast, please do so within one hour of getting up.   Each meal should contain half fruits/vegetables, one quarter protein, and one quarter carbs (no bigger than a computer mouse)  Cut down on sweet beverages. This includes juice, soda, and sweet tea.   Drink at least 1 glass of water with each meal and aim for at least 8 glasses per day  Exercise at least 150 minutes every week.

## 2022-02-25 NOTE — Assessment & Plan Note (Signed)
We are avoiding nephrotoxic medications.  Need to avoid metformin for now.  GFR 29 on most recent metabolic panel.  We can recheck at next office visit.

## 2022-02-25 NOTE — Assessment & Plan Note (Signed)
Had extensive discussion with patient and his brother regarding his diabetes treatment.  They are concerned the [name] is not working well for him.  It is also possible this could be contributing some to his dehydration.  We will stop this today.  We also reiterated that we cannot restart metformin due to his kidney function currently.  We will proceed with managing his sugars via insulin alone at this point.  We had discussed GLP-1 agonist in the past however he would like to hold off on this due to concern for diabetic retinopathy.  They will increase their Lantus to 34 units daily this morning.  Instructed them to increase by 1 unit daily every day that his fasting sugars greater than 100.  They will also take NovoLog 5 units after meals for any sugar readings greater than 200.  They will follow-up with me in a week.  We will titrate his insulin as needed.  We can rediscuss addition of GLP-1 agonist at some point in the future if his sugars are still not at goal with above.

## 2022-02-26 ENCOUNTER — Other Ambulatory Visit: Payer: Self-pay | Admitting: Family Medicine

## 2022-03-06 ENCOUNTER — Ambulatory Visit: Payer: PPO | Admitting: Family Medicine

## 2022-03-08 ENCOUNTER — Other Ambulatory Visit: Payer: Self-pay | Admitting: Family Medicine

## 2022-03-13 ENCOUNTER — Encounter: Payer: Self-pay | Admitting: Family Medicine

## 2022-03-13 ENCOUNTER — Ambulatory Visit (INDEPENDENT_AMBULATORY_CARE_PROVIDER_SITE_OTHER): Payer: PPO | Admitting: Family Medicine

## 2022-03-13 VITALS — BP 131/77 | HR 72 | Temp 97.9°F | Ht 68.0 in | Wt 160.0 lb

## 2022-03-13 DIAGNOSIS — N1832 Chronic kidney disease, stage 3b: Secondary | ICD-10-CM

## 2022-03-13 DIAGNOSIS — Z794 Long term (current) use of insulin: Secondary | ICD-10-CM | POA: Diagnosis not present

## 2022-03-13 DIAGNOSIS — G8929 Other chronic pain: Secondary | ICD-10-CM | POA: Diagnosis not present

## 2022-03-13 DIAGNOSIS — E1142 Type 2 diabetes mellitus with diabetic polyneuropathy: Secondary | ICD-10-CM | POA: Diagnosis not present

## 2022-03-13 DIAGNOSIS — M549 Dorsalgia, unspecified: Secondary | ICD-10-CM | POA: Diagnosis not present

## 2022-03-13 LAB — BASIC METABOLIC PANEL
BUN: 55 mg/dL — ABNORMAL HIGH (ref 6–23)
CO2: 25 mEq/L (ref 19–32)
Calcium: 9.1 mg/dL (ref 8.4–10.5)
Chloride: 100 mEq/L (ref 96–112)
Creatinine, Ser: 2.29 mg/dL — ABNORMAL HIGH (ref 0.40–1.50)
GFR: 28.85 mL/min — ABNORMAL LOW (ref >60.00)
Glucose, Bld: 210 mg/dL — ABNORMAL HIGH (ref 70–99)
Potassium: 4.5 mEq/L (ref 3.5–5.1)
Sodium: 136 mEq/L (ref 135–145)

## 2022-03-13 NOTE — Assessment & Plan Note (Addendum)
Had lengthy discussion with patient and his brother regarding his diabetes.  He has been monitoring his sugars at home and they are typically in the low 100s.  He will occasionally have spikes into the 200s after meals that usually comes back down of the mid 100s after taking 5 units of NovoLog.  He is taking 34 units of Lantus every morning.  Seems to be doing well.  He is occasionally having some overnight lows.  This usually responds to taking cookies or orange juice though has sugar in the morning will sometimes be elevated.  Recommend he take a small 100 to 200-calorie snack before bed to prevent overnight lows.  We will continue his current regimen of Lantus 34 units daily and NovoLog 5 units 3 times daily as needed for sugar greater than 200.  It is too early to recheck A1c today.  I will come back in about 10 weeks to have this done.

## 2022-03-13 NOTE — Assessment & Plan Note (Signed)
We will recheck be met today.

## 2022-03-13 NOTE — Assessment & Plan Note (Signed)
Still has ongoing issues with low back pain.  Also has some neck pain and shoulder pain.  Likely has underlying osteoarthritis.  Takes Tylenol as needed.  We will avoid NSAIDs due to his CKD.  We will place referral to sports medicine for further management.  He has worked with physical therapy in the past which has worked well.

## 2022-03-13 NOTE — Progress Notes (Signed)
   Jeffrey [name] is a 66 y.o. male who presents today for an office visit.  Assessment/Plan:  New/Acute Problems: Foot Pain Secondary to hammertoe deformity.  We will be placing referral to sports medicine as below.  Chronic Problems Addressed Today: T2DM (type 2 diabetes mellitus) ([place]) Had lengthy discussion with patient and his brother regarding his diabetes.  He has been monitoring his sugars at home and they are typically in the low 100s.  He will occasionally have spikes into the 200s after meals that usually comes back down of the mid 100s after taking 5 units of NovoLog.  He is taking 34 units of Lantus every morning.  Seems to be doing well.  He is occasionally having some overnight lows.  This usually responds to taking cookies or orange juice though has sugar in the morning will sometimes be elevated.  Recommend he take a small 100 to 200-calorie snack before bed to prevent overnight lows.  We will continue his current regimen of Lantus 34 units daily and NovoLog 5 units 3 times daily as needed for sugar greater than 200.  It is too early to recheck A1c today.  I will come back in about 10 weeks to have this done.  CKD (chronic kidney disease) stage 3, GFR 30-59 ml/min ([name]) We will recheck be met today.  Chronic back pain Still has ongoing issues with low back pain.  Also has some neck pain and shoulder pain.  Likely has underlying osteoarthritis.  Takes Tylenol as needed.  We will avoid NSAIDs due to his CKD.  We will place referral to sports medicine for further management.  He has worked with physical therapy in the past which has worked well.     Subjective:  HPI:  See A/p for status of chronic conditions.         Objective:  Physical Exam: BP 131/77   Pulse 72   Temp 97.9 F (36.6 C) (Temporal)   Ht $R'5\' 8"'iT$  (1.727 m)   Wt 160 lb (72.6 kg)   SpO2 100%   BMI 24.33 kg/m   Gen: No acute distress, resting comfortably Neuro: Grossly normal, moves all  extremities MSK: Hammertoe deformity on left foot. Psych: Normal affect and thought content  Time Spent: 45 minutes of total time was spent on the date of the encounter performing the following actions: chart review prior to seeing the patient, obtaining history, performing a medically necessary exam, counseling on the treatment plan, placing orders, and documenting in our EHR.        [name]. [name], MD 03/13/2022 11:09 AM

## 2022-03-13 NOTE — Patient Instructions (Signed)
It was very nice to see you today!  Your blood sugars are looking good.  Please try taking a 100 to 200-calorie snack at bedtime to prevent overnight lows.  We will check blood work today.  I will refer you to sports medicine.  We will see you back on 10/14 or later to recheck your A1c.  Please come back to see [place] sooner if needed.  Take care, Dr [name]  PLEASE NOTE:  If you had any lab tests please let us know if you have not heard back within a few days. You may see your results on mychart before we have a chance to review them but we will give you a call once they are reviewed by [place]. If we ordered any referrals today, please let us know if you have not heard from their office within the next week.   Please try these tips to maintain a healthy lifestyle:  Eat at least 3 REAL meals and 1-2 snacks per day.  Aim for no more than 5 hours between eating.  If you eat breakfast, please do so within one hour of getting up.   Each meal should contain half fruits/vegetables, one quarter protein, and one quarter carbs (no bigger than a computer mouse)  Cut down on sweet beverages. This includes juice, soda, and sweet tea.   Drink at least 1 glass of water with each meal and aim for at least 8 glasses per day  Exercise at least 150 minutes every week.

## 2022-03-14 NOTE — Progress Notes (Signed)
Please inform patient of the following:  Kidney function is much better than a few weeks ago and near what his baseline has been for the last year. Do not need to make any adjustments to his treatment plan at this time. He should make sure that he is getting plenty of fluids and we can recheck at his next office visit.  Jeffrey [name]. [name], MD 03/14/2022 8:31 AM

## 2022-03-24 ENCOUNTER — Encounter: Payer: Self-pay | Admitting: Family Medicine

## 2022-03-27 ENCOUNTER — Ambulatory Visit (INDEPENDENT_AMBULATORY_CARE_PROVIDER_SITE_OTHER): Payer: HMO | Admitting: Podiatry

## 2022-03-27 DIAGNOSIS — R2689 Other abnormalities of gait and mobility: Secondary | ICD-10-CM | POA: Diagnosis not present

## 2022-03-27 DIAGNOSIS — R269 Unspecified abnormalities of gait and mobility: Secondary | ICD-10-CM

## 2022-03-27 DIAGNOSIS — R252 Cramp and spasm: Secondary | ICD-10-CM

## 2022-03-27 NOTE — Progress Notes (Signed)
Referral packet has been faxed over to neurology

## 2022-03-27 NOTE — Progress Notes (Signed)
  Subjective:  Patient ID: Jeffrey [name], male    DOB: 1955/10/26,  MRN: [id number]  Chief Complaint  Patient presents with   Difficulty Walking     (np) bilateral foot imbalance   Diabetes    66 y.o. male presents with the above complaint. History confirmed with patient.  This been going on for some time.  He has a history of diabetic peripheral neuropathy.  He recently moved here from [place].  He notes that he has a very difficult time standing and walking and feels like he is going to fall forward or fall backwards quite a bit.  Previously says he saw a neurologist in [place] that did some testing but it is unclear what test these were and he does not have the results.  He did a year physical therapy to help address this.  He is interested in a balance brace   Objective:  Physical Exam: warm, good capillary refill, no trophic changes or ulcerative lesions, normal DP and PT pulses, and bilaterally has equinus deformity with some spasticity of the Achilles tendon, brisk reflex, 4 out of 5 strength in dorsiflexion and eversion, 5 out of 5 in inversion and plantarflexion  Assessment:   1. Gait disorder   2. Balance disorder   3. Spasticity      Plan:  Patient was evaluated and treated and all questions answered.  He does have significant gait abnormality and problems with balance.  I do not think this is solely diabetic peripheral neuropathy although it is a possibility.  I would like to have him evaluated by a neurologist.  I do think a Moore balance brace would benefit him and allow him to ambulate with more function.  I do not think additional physical therapy is indicated at this point.  Rx for Moore balance brace was written for him to obtain from [facility].  Referral to [facility] neuro was placed as well.  Return if symptoms worsen or fail to improve.

## 2022-03-29 ENCOUNTER — Other Ambulatory Visit: Payer: Self-pay | Admitting: Family Medicine

## 2022-04-01 ENCOUNTER — Ambulatory Visit: Payer: PPO | Admitting: Family Medicine

## 2022-04-07 DIAGNOSIS — H524 Presbyopia: Secondary | ICD-10-CM | POA: Diagnosis not present

## 2022-04-07 DIAGNOSIS — E113593 Type 2 diabetes mellitus with proliferative diabetic retinopathy without macular edema, bilateral: Secondary | ICD-10-CM | POA: Diagnosis not present

## 2022-04-07 DIAGNOSIS — H25813 Combined forms of age-related cataract, bilateral: Secondary | ICD-10-CM | POA: Diagnosis not present

## 2022-04-22 DIAGNOSIS — E1151 Type 2 diabetes mellitus with diabetic peripheral angiopathy without gangrene: Secondary | ICD-10-CM | POA: Diagnosis not present

## 2022-04-22 DIAGNOSIS — I739 Peripheral vascular disease, unspecified: Secondary | ICD-10-CM | POA: Diagnosis not present

## 2022-04-22 DIAGNOSIS — R262 Difficulty in walking, not elsewhere classified: Secondary | ICD-10-CM | POA: Diagnosis not present

## 2022-04-22 DIAGNOSIS — M21961 Unspecified acquired deformity of right lower leg: Secondary | ICD-10-CM | POA: Diagnosis not present

## 2022-04-22 DIAGNOSIS — G629 Polyneuropathy, unspecified: Secondary | ICD-10-CM | POA: Diagnosis not present

## 2022-04-22 DIAGNOSIS — M792 Neuralgia and neuritis, unspecified: Secondary | ICD-10-CM | POA: Diagnosis not present

## 2022-04-22 DIAGNOSIS — R2681 Unsteadiness on feet: Secondary | ICD-10-CM | POA: Diagnosis not present

## 2022-04-22 DIAGNOSIS — M21962 Unspecified acquired deformity of left lower leg: Secondary | ICD-10-CM | POA: Diagnosis not present

## 2022-04-22 DIAGNOSIS — M6281 Muscle weakness (generalized): Secondary | ICD-10-CM | POA: Diagnosis not present

## 2022-04-23 DIAGNOSIS — E113513 Type 2 diabetes mellitus with proliferative diabetic retinopathy with macular edema, bilateral: Secondary | ICD-10-CM | POA: Diagnosis not present

## 2022-04-28 ENCOUNTER — Ambulatory Visit (HOSPITAL_BASED_OUTPATIENT_CLINIC_OR_DEPARTMENT_OTHER): Payer: HMO | Admitting: Cardiovascular Disease

## 2022-05-05 ENCOUNTER — Encounter: Payer: Self-pay | Admitting: *Deleted

## 2022-05-07 ENCOUNTER — Other Ambulatory Visit: Payer: Self-pay | Admitting: Family Medicine

## 2022-05-09 ENCOUNTER — Telehealth: Payer: Self-pay | Admitting: Family Medicine

## 2022-05-09 ENCOUNTER — Ambulatory Visit: Payer: HMO

## 2022-05-09 ENCOUNTER — Other Ambulatory Visit: Payer: Self-pay | Admitting: *Deleted

## 2022-05-09 DIAGNOSIS — E1142 Type 2 diabetes mellitus with diabetic polyneuropathy: Secondary | ICD-10-CM

## 2022-05-09 NOTE — Telephone Encounter (Signed)
Pt is requesting labs before his visit on 05/26/22. He is requesting A1C and BUN. Please advise

## 2022-05-09 NOTE — Telephone Encounter (Signed)
Spoke with patient, stated had Flu vaccine done on 05/08/2022 at [facility] recorded in patient chart.  Future labs placed  and appointment schedule

## 2022-05-22 ENCOUNTER — Other Ambulatory Visit (INDEPENDENT_AMBULATORY_CARE_PROVIDER_SITE_OTHER): Payer: HMO

## 2022-05-22 DIAGNOSIS — Z794 Long term (current) use of insulin: Secondary | ICD-10-CM | POA: Diagnosis not present

## 2022-05-22 DIAGNOSIS — E1142 Type 2 diabetes mellitus with diabetic polyneuropathy: Secondary | ICD-10-CM | POA: Diagnosis not present

## 2022-05-22 LAB — BUN: BUN: 57 mg/dL — ABNORMAL HIGH (ref 6–23)

## 2022-05-22 LAB — HEMOGLOBIN A1C: Hgb A1c MFr Bld: 8.5 % — ABNORMAL HIGH (ref 4.6–6.5)

## 2022-05-26 ENCOUNTER — Encounter: Payer: Self-pay | Admitting: Family Medicine

## 2022-05-26 ENCOUNTER — Other Ambulatory Visit: Payer: Self-pay | Admitting: Family Medicine

## 2022-05-26 ENCOUNTER — Ambulatory Visit (INDEPENDENT_AMBULATORY_CARE_PROVIDER_SITE_OTHER): Payer: HMO | Admitting: Family Medicine

## 2022-05-26 VITALS — BP 113/70 | HR 72 | Temp 97.1°F | Ht 68.0 in | Wt 162.0 lb

## 2022-05-26 DIAGNOSIS — I152 Hypertension secondary to endocrine disorders: Secondary | ICD-10-CM | POA: Diagnosis not present

## 2022-05-26 DIAGNOSIS — E1142 Type 2 diabetes mellitus with diabetic polyneuropathy: Secondary | ICD-10-CM | POA: Diagnosis not present

## 2022-05-26 DIAGNOSIS — Z23 Encounter for immunization: Secondary | ICD-10-CM

## 2022-05-26 DIAGNOSIS — E1159 Type 2 diabetes mellitus with other circulatory complications: Secondary | ICD-10-CM | POA: Diagnosis not present

## 2022-05-26 DIAGNOSIS — Z794 Long term (current) use of insulin: Secondary | ICD-10-CM

## 2022-05-26 MED ORDER — ONETOUCH VERIO VI STRP: ORAL_STRIP | 9 refills | Status: DC

## 2022-05-26 NOTE — Progress Notes (Signed)
BUN is stable and A1c is improved. We will discuss more at his office visit today.

## 2022-05-26 NOTE — Assessment & Plan Note (Signed)
Latest A1c slightly elevated 8.5.  Will increase Lantus to 36 units daily.  Continue taking NovoLog 5 units 3 times daily as needed for sugars greater than 200.  He will continue to work on eating low sugar and low-carb diet.  Follow-up in 3 months.

## 2022-05-26 NOTE — Addendum Note (Signed)
Addended by: Betti Cruz on: 05/26/2022 11:28 AM   Modules accepted: Orders

## 2022-05-26 NOTE — Progress Notes (Signed)
   Jeffrey [name] is a 66 y.o. male who presents today for an office visit.  Assessment/Plan:  Chronic Problems Addressed Today: Hypertension associated with diabetes ([name]) At goal today on Coreg 3.125 mg twice daily and spironolactone 25 mg daily.  [name] Latest A1c slightly elevated 8.5.  Will increase Lantus to 36 units daily.  Continue taking NovoLog 5 units 3 times daily as needed for sugars greater than 200.  He will continue to work on eating low sugar and low-carb diet.  Follow-up in 3 months.  Pneumonia vaccine given today.    Subjective:  HPI:  See A/p for status of chronic conditions.         Objective:  Physical Exam: BP 113/70   Pulse 72   Temp (!) 97.1 F (36.2 C) (Temporal)   Ht '5\' 8"'$  (1.727 m)   Wt 162 lb (73.5 kg)   SpO2 100%   BMI 24.63 kg/m   Gen: No acute distress, resting comfortably CV: Regular rate and rhythm with no murmurs appreciated Pulm: Normal work of breathing, clear to auscultation bilaterally with no crackles, wheezes, or rhonchi Neuro: Grossly normal, moves all extremities Psych: Normal affect and thought content      Jeffrey [name] M. [name], MD 05/26/2022 11:09 AM

## 2022-05-26 NOTE — Assessment & Plan Note (Signed)
At goal today on Coreg 3.125 mg twice daily and spironolactone 25 mg daily.

## 2022-05-26 NOTE — Patient Instructions (Addendum)
It was very nice to see you today!  Please increase your lantus to 36 units daily.  We will give you your pneumonia shot today.  Please come back in 3 months or sooner if needed  Take care, Dr [name]  PLEASE NOTE:  If you had any lab tests please let us know if you have not heard back within a few days. You may see your results on mychart before we have a chance to review them but we will give you a call once they are reviewed by [place]. If we ordered any referrals today, please let us know if you have not heard from their office within the next week.   Please try these tips to maintain a healthy lifestyle:  Eat at least 3 REAL meals and 1-2 snacks per day.  Aim for no more than 5 hours between eating.  If you eat breakfast, please do so within one hour of getting up.   Each meal should contain half fruits/vegetables, one quarter protein, and one quarter carbs (no bigger than a computer mouse)  Cut down on sweet beverages. This includes juice, soda, and sweet tea.   Drink at least 1 glass of water with each meal and aim for at least 8 glasses per day  Exercise at least 150 minutes every week.

## 2022-05-29 ENCOUNTER — Other Ambulatory Visit: Payer: Self-pay | Admitting: *Deleted

## 2022-05-29 MED ORDER — ONETOUCH VERIO VI STRP: ORAL_STRIP | 1 refills | Status: DC

## 2022-06-04 ENCOUNTER — Other Ambulatory Visit: Payer: Self-pay | Admitting: *Deleted

## 2022-06-04 MED ORDER — ATORVASTATIN CALCIUM 80 MG PO TABS: 80.0000 mg | ORAL_TABLET | Freq: Every day | ORAL | 1 refills | Status: DC

## 2022-06-04 NOTE — Telephone Encounter (Signed)
Pharmacy requesting refill  Rx Spironolactone  Last refills by historical provider

## 2022-06-05 MED ORDER — SPIRONOLACTONE 25 MG PO TABS: 25.0000 mg | ORAL_TABLET | Freq: Every day | ORAL | 0 refills | Status: DC

## 2022-06-06 ENCOUNTER — Other Ambulatory Visit: Payer: Self-pay | Admitting: Family Medicine

## 2022-07-01 ENCOUNTER — Ambulatory Visit (INDEPENDENT_AMBULATORY_CARE_PROVIDER_SITE_OTHER): Payer: HMO | Admitting: Family Medicine

## 2022-07-01 ENCOUNTER — Encounter: Payer: Self-pay | Admitting: Family Medicine

## 2022-07-01 VITALS — BP 126/71 | HR 82 | Temp 97.8°F | Ht 68.0 in | Wt 168.6 lb

## 2022-07-01 DIAGNOSIS — I152 Hypertension secondary to endocrine disorders: Secondary | ICD-10-CM | POA: Diagnosis not present

## 2022-07-01 DIAGNOSIS — E1159 Type 2 diabetes mellitus with other circulatory complications: Secondary | ICD-10-CM | POA: Diagnosis not present

## 2022-07-01 DIAGNOSIS — M25521 Pain in right elbow: Secondary | ICD-10-CM | POA: Diagnosis not present

## 2022-07-01 DIAGNOSIS — E1142 Type 2 diabetes mellitus with diabetic polyneuropathy: Secondary | ICD-10-CM | POA: Diagnosis not present

## 2022-07-01 DIAGNOSIS — Z794 Long term (current) use of insulin: Secondary | ICD-10-CM

## 2022-07-01 MED ORDER — INSULIN ASPART 100 UNIT/ML IJ SOLN: INTRAMUSCULAR | 5 refills | Status: DC

## 2022-07-01 NOTE — Progress Notes (Signed)
   Jeffrey [name] is a 66 y.o. male who presents today for an office visit.  Assessment/Plan:  New/Acute Problems: Lateral epicondylitis No red flags.  We discussed counterforce bracing.  Patient would like to have an x-ray to make sure there is nothing else going on.  Given length of symptoms this is reasonable.  We will place order today.  We did discuss repeat cortisone shot however he deferred for now.  Also discussed referral to physical therapy however he would like an x-ray result first.  Would avoid NSAIDs at this point given his CKD.  Also try to limit prednisone use due to type 2 diabetes.  Chronic Problems Addressed Today: Hypertension associated with diabetes ([place]) At goal on Coreg 3.125 mg twice daily and spironolactone 25 mg daily.  T2DM (type 2 diabetes mellitus) ([place]) He is having some low sugars at night into the 30s.  He has been taking the NovoLog 5 units for sugars greater than 200s and thinks this is bottoming him out.  He has been compliant with his Lantus.  Fasting sugars are usually at goal.  We will decrease his sliding scale NovoLog to 3 units for sugars greater than 200, 6 units for sugars greater than 300, and 9 units for sugar greater than 400.  He will come back in a couple months to recheck A1c.     Subjective:  HPI:  See A/p for status of chronic conditions.   His main concern today his right arm pain. This has been going on for several months.  We saw him about 6 months ago for this.  He was diagnosed with tennis elbow.  Received cortisone shot.  He does not member if this helped.  Symptoms have worsened over the last several weeks.  No specific treatments tried.  Worse with certain motions.       Objective:  Physical Exam: BP 126/71   Pulse 82   Temp 97.8 F (36.6 C) (Temporal)   Ht '5\' 8"'$  (1.727 m)   Wt 168 lb 9.6 oz (76.5 kg)   SpO2 100%   BMI 25.64 kg/m   Gen: No acute distress, resting comfortably MSK: - Right Elbow: No deformities.   Tenderness palpation along right lateral epicondyle.  Pain elicited with resisted extension of third digit. Neuro: Grossly normal, moves all extremities Psych: Normal affect and thought content      Apple Dearmas M. [name], MD 07/01/2022 10:03 AM

## 2022-07-01 NOTE — Assessment & Plan Note (Signed)
At goal on Coreg 3.125 mg twice daily and spironolactone 25 mg daily.

## 2022-07-01 NOTE — Patient Instructions (Signed)
It was very nice to see you today!  I think you have tennis elbow.  We will check an x-ray to make sure there is nothing else is going on.  Please change your NovoLog to 3 units for sugars greater than 200.  We may need to do another cortisone shot we will refer you to see physical therapy depending on results of your x-ray.  Please come back in a couple of months to recheck your A1c.  Take care, Dr [name]  PLEASE NOTE:  If you had any lab tests please let us know if you have not heard back within a few days. You may see your results on mychart before we have a chance to review them but we will give you a call once they are reviewed by [place]. If we ordered any referrals today, please let us know if you have not heard from their office within the next week.   Please try these tips to maintain a healthy lifestyle:  Eat at least 3 REAL meals and 1-2 snacks per day.  Aim for no more than 5 hours between eating.  If you eat breakfast, please do so within one hour of getting up.   Each meal should contain half fruits/vegetables, one quarter protein, and one quarter carbs (no bigger than a computer mouse)  Cut down on sweet beverages. This includes juice, soda, and sweet tea.   Drink at least 1 glass of water with each meal and aim for at least 8 glasses per day  Exercise at least 150 minutes every week.

## 2022-07-01 NOTE — Assessment & Plan Note (Signed)
He is having some low sugars at night into the 30s.  He has been taking the NovoLog 5 units for sugars greater than 200s and thinks this is bottoming him out.  He has been compliant with his Lantus.  Fasting sugars are usually at goal.  We will decrease his sliding scale NovoLog to 3 units for sugars greater than 200, 6 units for sugars greater than 300, and 9 units for sugar greater than 400.  He will come back in a couple months to recheck A1c.

## 2022-07-05 ENCOUNTER — Other Ambulatory Visit: Payer: Self-pay | Admitting: Family Medicine

## 2022-07-08 ENCOUNTER — Other Ambulatory Visit: Payer: Self-pay | Admitting: Family Medicine

## 2022-07-09 ENCOUNTER — Ambulatory Visit (INDEPENDENT_AMBULATORY_CARE_PROVIDER_SITE_OTHER)
Admission: RE | Admit: 2022-07-09 | Discharge: 2022-07-09 | Disposition: A | Discharge status: Home / Self Care | Payer: HMO | Source: Ambulatory Visit | Attending: Family Medicine | Admitting: Family Medicine

## 2022-07-09 DIAGNOSIS — M25521 Pain in right elbow: Secondary | ICD-10-CM | POA: Diagnosis not present

## 2022-07-11 NOTE — Progress Notes (Signed)
Please inform patient of the following:  His x-ray is normal.  No signs of fracture or arthritis.  He likely has tennis elbow as we discussed at his office visit.  Recommend he follow-up her to discuss next steps of treatment or we can refer him to see a physical therapist.

## 2022-07-15 ENCOUNTER — Ambulatory Visit (INDEPENDENT_AMBULATORY_CARE_PROVIDER_SITE_OTHER): Payer: HMO | Admitting: Family Medicine

## 2022-07-15 ENCOUNTER — Encounter: Payer: Self-pay | Admitting: Family Medicine

## 2022-07-15 VITALS — BP 130/85 | HR 50 | Temp 97.1°F | Resp 16 | Ht 68.0 in | Wt 166.4 lb

## 2022-07-15 DIAGNOSIS — M25561 Pain in right knee: Secondary | ICD-10-CM

## 2022-07-15 DIAGNOSIS — Z794 Long term (current) use of insulin: Secondary | ICD-10-CM

## 2022-07-15 DIAGNOSIS — E1142 Type 2 diabetes mellitus with diabetic polyneuropathy: Secondary | ICD-10-CM

## 2022-07-15 DIAGNOSIS — M7711 Lateral epicondylitis, right elbow: Secondary | ICD-10-CM | POA: Diagnosis not present

## 2022-07-15 DIAGNOSIS — I152 Hypertension secondary to endocrine disorders: Secondary | ICD-10-CM

## 2022-07-15 DIAGNOSIS — E1159 Type 2 diabetes mellitus with other circulatory complications: Secondary | ICD-10-CM

## 2022-07-15 DIAGNOSIS — Z125 Encounter for screening for malignant neoplasm of prostate: Secondary | ICD-10-CM

## 2022-07-15 MED ORDER — METHYLPREDNISOLONE ACETATE 40 MG/ML IJ SUSP
40.0000 mg | Freq: Once | INTRAMUSCULAR | Status: AC
  Administered 2022-07-15: 40 mg via INTRA_ARTICULAR

## 2022-07-15 MED ORDER — LIDOCAINE HCL (PF) 1 % IJ SOLN
5.0000 mL | Freq: Once | INTRAMUSCULAR | Status: AC
  Administered 2022-07-15: 5 mL

## 2022-07-15 NOTE — Assessment & Plan Note (Signed)
At goal today on current regimen Coreg 3.125 mg twice daily and spironolactone 25 mg daily.

## 2022-07-15 NOTE — Progress Notes (Addendum)
Jeffrey Jeffrey [name] is a 66 y.o. male who presents today for an office visit.  Assessment/Plan:  New/Acute Problems: Right lateral epicondylitis He did well with steroid injection earlier this year however has had a recurrence.  He has tried conservative measures without much improvement.  We did discuss treatment options.  A steroid injection was performed today.  See below procedure note.  He tolerated well.  If this continues to be an issue would consider referral to sports medicine.  Will refer to physical therapy today.  Right knee Jeffrey [name] No red flags.  Secondary to known underlying osteoarthritis.  Has a known underlying meniscal tear as well.  Steroid injection performed today.  See below procedure note.  If this continues to be an issue will place referral to sports medicine.  He can continue using over-the-counter meds.  Continue compression therapy.  Continue ice as needed.  Will refer to physical therapy today as well.  Chronic Problems Addressed Today: T2DM (type 2 diabetes mellitus) ([place]) Last A1c 8.5.  We have adjusted his insulin regimen.  He is now on 38 units of Lantus and NovoLog 3units as needed for sugars greater than 200.  Too early to recheck A1c today.  He will come back next month and we will recheck A1c at that time.  Do not dissipate he will have much change with his sugars despite his steroid injections today however he will let us know if this occurs and we can adjust as needed.  Hypertension associated with diabetes ([place]) At goal today on current regimen Coreg 3.125 mg twice daily and spironolactone 25 mg daily.     Subjective:  HPI:  See A/p for status of chronic conditions.  He is here today with right elbow Jeffrey [name] and right knee Jeffrey [name].  We saw him a few weeks ago for right elbow Jeffrey [name].  He was having some issues with recurrent tennis elbow at that time.  We did discuss steroid injection at time however he deferred.  He had a steroid injection previous in the year  that worked well for several months.  At our last visit we got an x-ray which showed no acute fractures or degenerative changes.  Jeffrey [name] has persisted for the last few weeks.  Worse with certain motions.  Worse with picking things up.  He has also had ongoing issues with right knee Jeffrey [name].  This been a chronic issue for him for several years.  Is been diagnosed with meniscal tear and arthritis in the past.  He has been using over-the-counter meds and compression therapy at home however over the last couple weeks symptoms seem to be worsening.  No obvious precipitating events or recent injuries.         Objective:  Physical Exam: BP 130/85   Pulse (!) 50   Temp (!) 97.1 F (36.2 C) (Temporal)   Resp 16   Ht '5\' 8"'$  (1.727 m)   Wt 166 lb 6.4 oz (75.5 kg)   SpO2 100%   BMI 25.30 kg/m   Gen: No acute distress, resting comfortably CV: Regular rate and rhythm with no murmurs appreciated Pulm: Normal work of breathing, clear to auscultation bilaterally with no crackles, wheezes, or rhonchi MSK:  - Right Elbow: No obvious deformities.  Tenderness palpation along inferior edge of right lateral epicondyle - Right knee: No deformities.  Full range of motion throughout.  Obvious crepitus with passive and active range of motion.  Stable to varus and valgus stress. Neuro: Grossly normal, moves all extremities  Psych: Normal affect and thought content  Knee Injection Procedure Note  Indication: Symptom relief of right Knee Jeffrey [name].  Procedure Details  Verbal consent was obtained for the procedure. The joint was prepped with Betadine. Topical ethyl chloride was applied for anesthesia. A 22 gauge needle was inserted into the medial aspect of the joint.  3 ml 1% lidocaine and 1 ml of '40mg'$ /cc Depo-Medrol was then injected into the joint. The needle was removed and the area cleansed and dressed.  Complications:  None; patient tolerated the procedure well.   Elbow Injection Procedure Note: Verbal consent was  obtained after discussing risk and benefits as well as alternatives.  His right lateral epicondyle was marked at the point of maximal tenderness.  Area was cleansed with alcohol swab x3.  Topical ethyl chloride was applied for anesthesia.  A mixture of 2 mL of 1% lidocaine without epinephrine and 1 mL of '40mg'$ /cc Depo-Medrol was then injected into the point of maximal tenderness.  Needle was withdrawn and sterile bandage was placed.  Minimal blood loss.  He tolerated well.  No complications.         [name]. Jeffrey Pain, MD 07/15/2022 11:04 AM

## 2022-07-15 NOTE — Assessment & Plan Note (Addendum)
Last A1c 8.5.  We have adjusted his insulin regimen.  He is now on 38 units of Lantus and NovoLog 3units as needed for sugars greater than 200.  Too early to recheck A1c today.  He will come back next month and we will recheck A1c at that time.  Do not dissipate he will have much change with his sugars despite his steroid injections today however he will let us know if this occurs and we can adjust as needed.

## 2022-07-15 NOTE — Addendum Note (Signed)
Addended by: Doran Clay A on: 07/15/2022 11:25 AM   Modules accepted: Orders

## 2022-07-15 NOTE — Patient Instructions (Addendum)
It was very nice to see you today!  We injected your elbow and knee today.  I will refer you to see a physical therapist.  We will order your lab work today.  We will see you back in a month for your routine follow-up.  Please come back to see [place] sooner if needed.  Take care, Dr [name]  PLEASE NOTE:  If you had any lab tests please let us know if you have not heard back within a few days. You may see your results on mychart before we have a chance to review them but we will give you a call once they are reviewed by [place]. If we ordered any referrals today, please let us know if you have not heard from their office within the next week.   Please try these tips to maintain a healthy lifestyle:  Eat at least 3 REAL meals and 1-2 snacks per day.  Aim for no more than 5 hours between eating.  If you eat breakfast, please do so within one hour of getting up.   Each meal should contain half fruits/vegetables, one quarter protein, and one quarter carbs (no bigger than a computer mouse)  Cut down on sweet beverages. This includes juice, soda, and sweet tea.   Drink at least 1 glass of water with each meal and aim for at least 8 glasses per day  Exercise at least 150 minutes every week.

## 2022-07-27 ENCOUNTER — Encounter: Payer: Self-pay | Admitting: Family Medicine

## 2022-07-28 IMAGING — DX DG LUMBAR SPINE COMPLETE 4+V
5 series · 5 of 5 positions shown · non-contrast
Comparison: None.

CLINICAL DATA: Low back pain.

EXAM:
LUMBAR SPINE - COMPLETE 4+ VIEW

[lumbar spine ap]
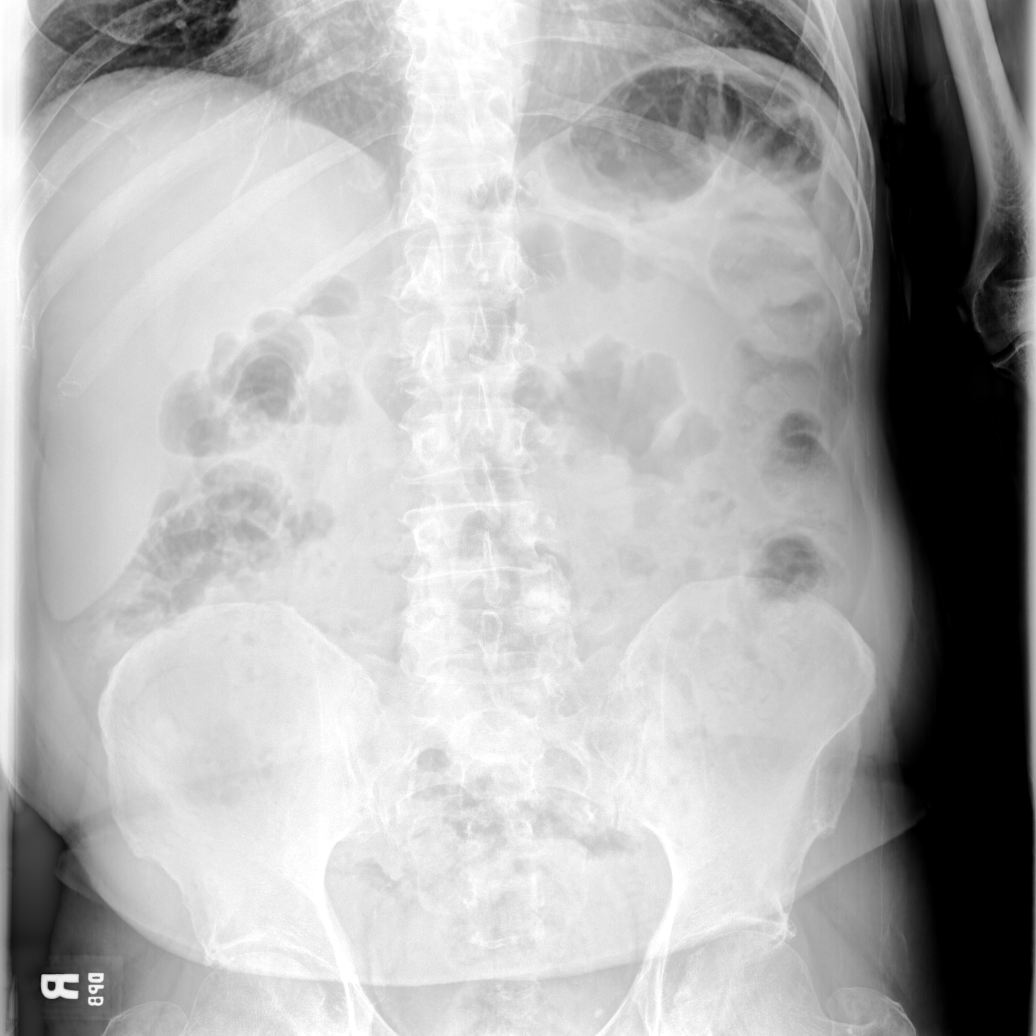

[lumbar spine oblique (1 of 2)]
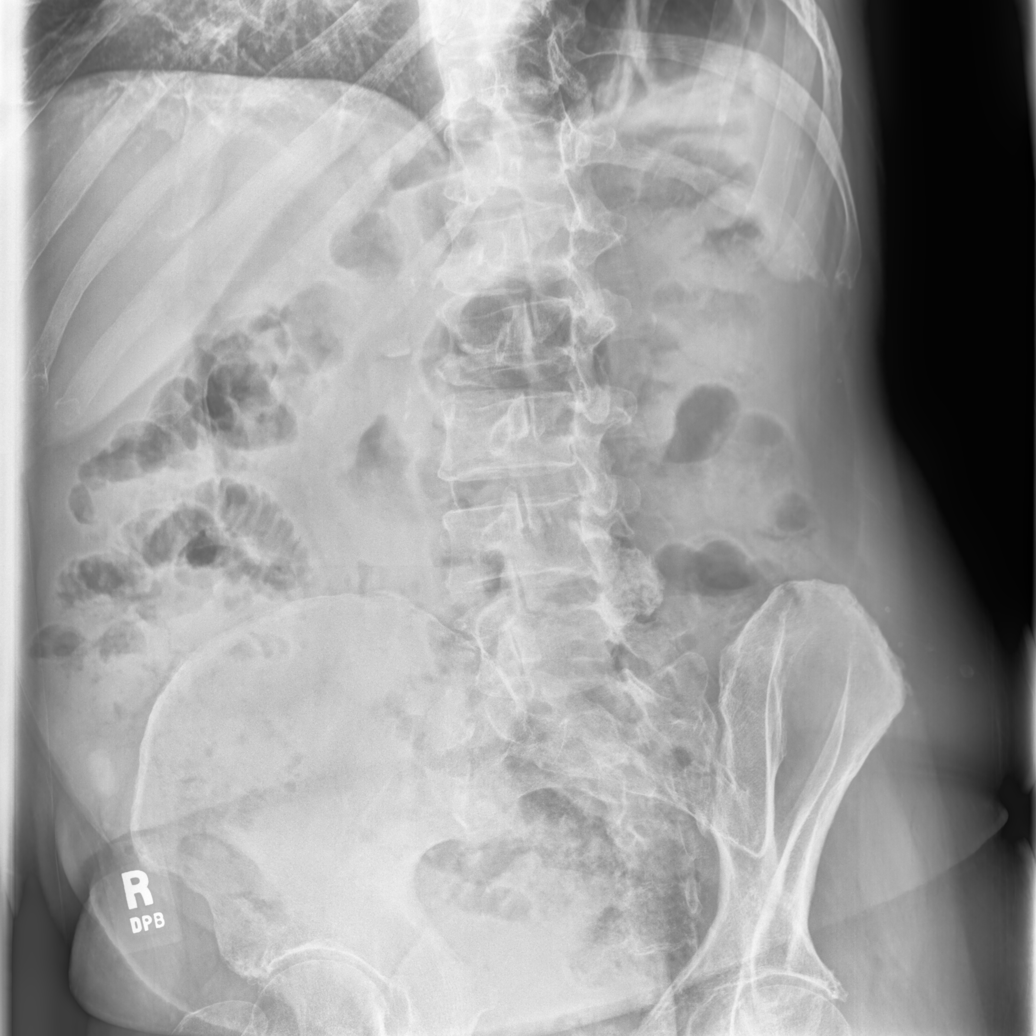

[lumbar spine oblique (2 of 2)]
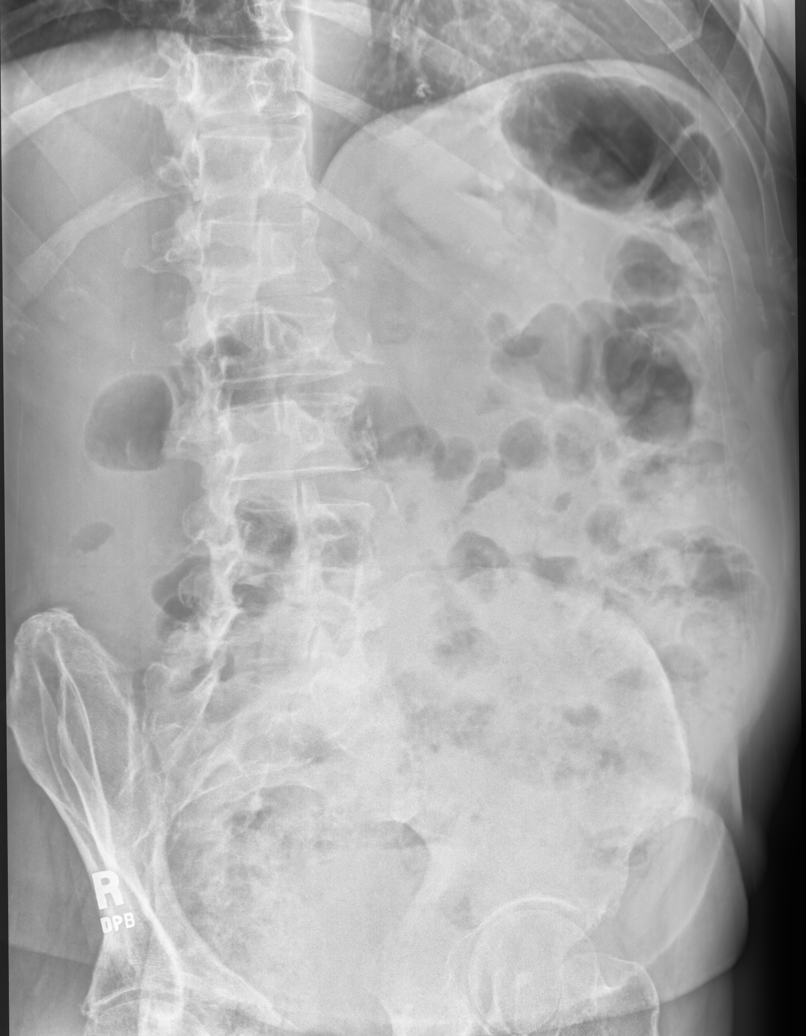

[lumbar spine lat (1 of 2)]
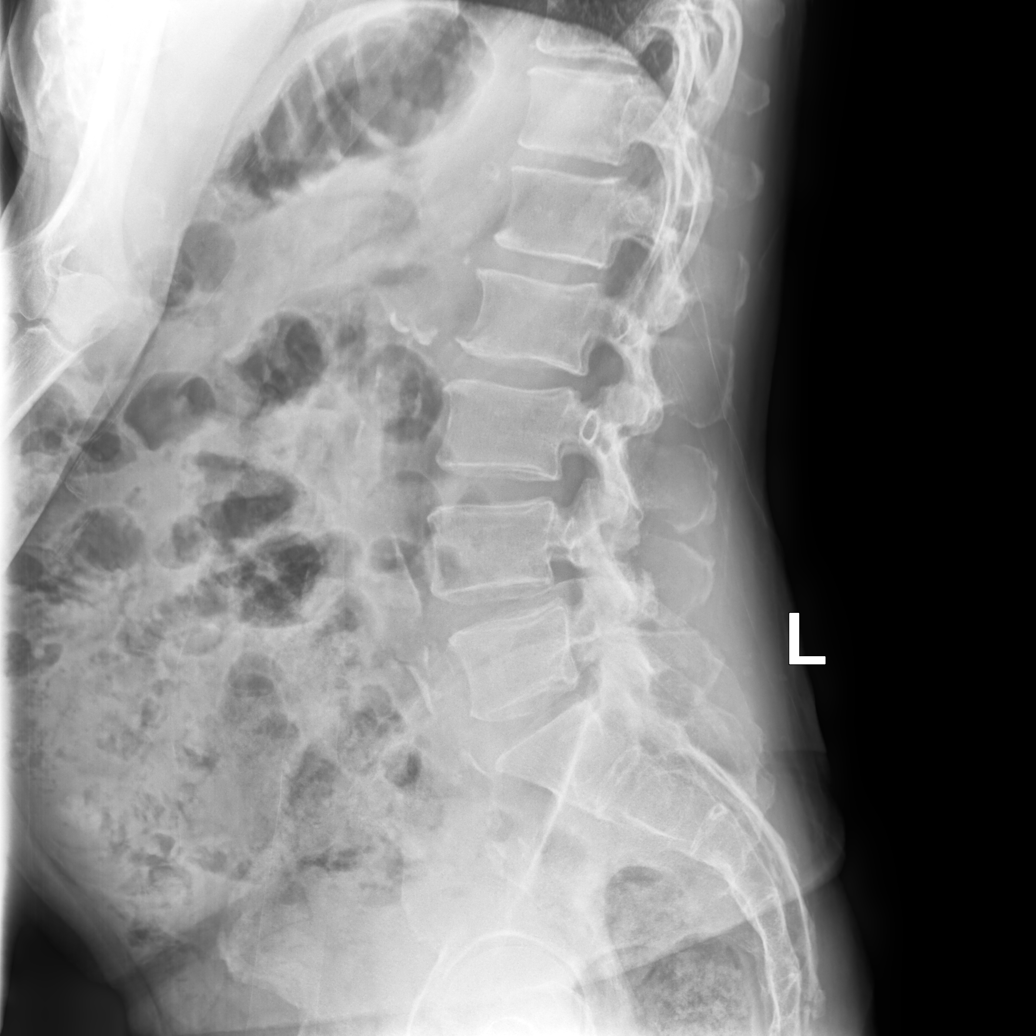

[lumbar spine lat (2 of 2)]
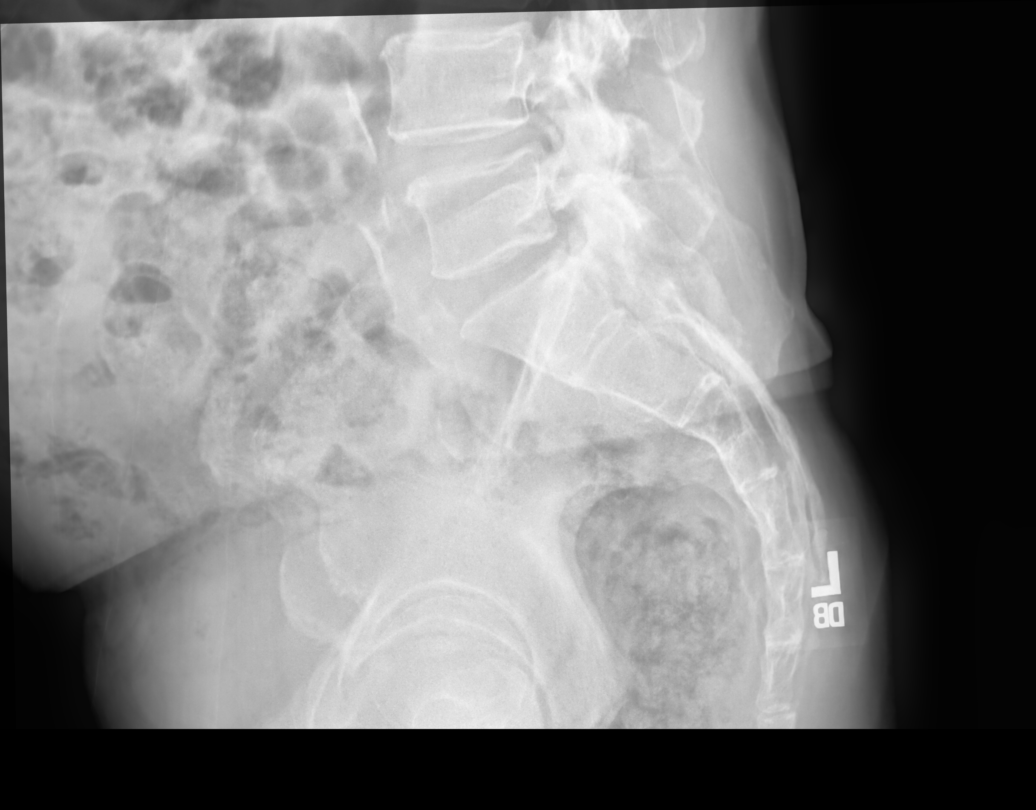

[5 of 5 positions shown; findings below may reference images not displayed]

FINDINGS: The lumbar spine demonstrates normal alignment and no evidence of
fracture or subluxation. No significant disc space narrowing. Facet
hypertrophy at the L4-5 and L5-S1 levels. No bony lesions. Calcified
atherosclerotic plaque is present in the abdominal aorta.
IMPRESSION: Facet hypertrophy at L4-5 and L5-S1.  Aortic atherosclerosis.

## 2022-07-28 NOTE — Telephone Encounter (Signed)
See note, patient stated still with a lot of pain due to fall on Saturday night.  Requesting Xray

## 2022-07-29 ENCOUNTER — Other Ambulatory Visit: Payer: Self-pay | Admitting: *Deleted

## 2022-07-29 DIAGNOSIS — M549 Dorsalgia, unspecified: Secondary | ICD-10-CM

## 2022-07-29 NOTE — Telephone Encounter (Signed)
Please have him schedule an appointment if possible. If this is not possible then ok with ordering dg lumbar spine.  [name]. [name], MD 07/29/2022 1:14 PM

## 2022-07-29 NOTE — Telephone Encounter (Signed)
Spoke with patient, patient aware Xray order placed Advise to schedule OV with PCP, patient will call to schedule after xray done

## 2022-07-30 ENCOUNTER — Ambulatory Visit (INDEPENDENT_AMBULATORY_CARE_PROVIDER_SITE_OTHER)
Admission: RE | Admit: 2022-07-30 | Discharge: 2022-07-30 | Disposition: A | Discharge status: Home / Self Care | Payer: HMO | Source: Ambulatory Visit | Attending: Family Medicine | Admitting: Family Medicine

## 2022-07-30 DIAGNOSIS — M549 Dorsalgia, unspecified: Secondary | ICD-10-CM | POA: Diagnosis not present

## 2022-08-01 ENCOUNTER — Other Ambulatory Visit: Payer: Self-pay | Admitting: *Deleted

## 2022-08-01 DIAGNOSIS — S32010K Wedge compression fracture of first lumbar vertebra, subsequent encounter for fracture with nonunion: Secondary | ICD-10-CM

## 2022-08-01 NOTE — Progress Notes (Signed)
Please inform patient of the following:  His x-ray shows that he does have a compression fracture at his lower back.  It is not clear when this happened but it is possible from his recent injury.  Recommend we refer him to orthopedics if he is still having ongoing pain.  If his pain is severe recommend that he go immediately to the ED.   He does have some degenerative changes in his back that are probably contributing to his back pain as well however do not think this is causing any new or acute issues.

## 2022-08-05 ENCOUNTER — Other Ambulatory Visit: Payer: Self-pay | Admitting: Family Medicine

## 2022-08-12 ENCOUNTER — Other Ambulatory Visit: Payer: Self-pay | Admitting: *Deleted

## 2022-08-12 DIAGNOSIS — G8929 Other chronic pain: Secondary | ICD-10-CM

## 2022-08-13 ENCOUNTER — Encounter: Payer: Self-pay | Admitting: Orthopedic Surgery

## 2022-08-13 ENCOUNTER — Ambulatory Visit (INDEPENDENT_AMBULATORY_CARE_PROVIDER_SITE_OTHER): Payer: PPO

## 2022-08-13 ENCOUNTER — Ambulatory Visit: Payer: PPO | Admitting: Orthopedic Surgery

## 2022-08-13 VITALS — BP 156/72 | HR 76 | Ht 68.0 in | Wt 167.0 lb

## 2022-08-13 DIAGNOSIS — M4712 Other spondylosis with myelopathy, cervical region: Secondary | ICD-10-CM

## 2022-08-13 NOTE — Progress Notes (Signed)
Orthopedic Spine Surgery Office Note  Assessment: Patient is a 67 y.o. male with two issues:  Acute L1 compression after a fall 2 weeks ago Chronic, progressive loss of ambulatory ability hand dexterity issues, concern for myelopathy   Plan: -I discussed that I expect his compression fracture to get better with time. He is not interested in anything besides tylenol for pain control so I told him to keep using that. He can be out of bed as tolerated without a brace -I ordered an MRI of his cervical spine to work up myelopathy -Since he has B12 deficiency in his history, will also look for possible non-compressive myelopathies at next visit too (Cu, Zn, B12, Vit E) -Patient should return to office in 4 weeks, x-rays at next visit: lumbar AP and lateral   Patient expressed understanding of the plan and all questions were answered to the patient's satisfaction.   ___________________________________________________________________________   History:  Patient is a 67 y.o. male who presents today for lumbar spine. Patient presented to my office today to discuss his acute onset of low back pain. States he had a fall in his bedroom about 2 weeks ago. Afterwards, he noted acute onset of low back pain. It does not radiate into the legs. It is worse if he is active or sitting in one position for too long. He has a history of chronic urinary incontinence with no recent changes. Denies paresthesias or numbness in either lower extremity.   On my screening form, he circled several boxes that prompted further discussion. He states that he has had urinary incontinence for the last 2-3 years. He wears adult diapers daily since he has no control or idea when he has to void. He has no fecal incontinence. He has not gotten any work up for his incontinence. He also reports worsening balance issues for the last 4 years. He moved to the area about 4 years ago from [place]. He was able to ambulate independently  without assistive devices when he initially moved here, but as time has progressed, he has needed increasing assistance. He has gotten to the point now that he cannot walk anywhere without a walker and more recently is using a wheelchair for longer distance. He also notes difficulty using his hands. They always feel cold and tingly. He cannot buttons shirts and periodically drops objects. He now grabs his coffee with two hands always due to concern that he may drop it. He is still able to feed himself.    Weakness: denies Difficulty with fine motor skills (e.g., buttoning shirts, handwriting): yes Symptoms of imbalance: yes Paresthesias and numbness: yes into bilateral hands, no other numbness or paresthesias Bowel or bladder incontinence: yes, chronic urinary incontinence. No fecal incontinence Saddle anesthesia: denies  Treatments tried: physical therapy, ambulatory aids, tylenol  Review of systems: Denies fevers and chills, night sweats, unexplained weight loss, history of cancer. Has had low back pain that wakes him at night  Past medical history: MI CHF CKD DM2 (last A1C 8.5 on 05/22/2022) Retinopathy Neuropathy HLD HTN Vit B12 deficiency  Allergies: farxiga, codeine, losartan, penicillins  Past surgical history:  Angioplasty Vitrectomy Photocoagulation with laser Refractive surgery  Social history: Denies use of nicotine product (smoking, vaping, patches, smokeless) Alcohol use: denies Denies recreational drug use  Physical Exam:  General: no acute distress, appears stated age Neurologic: alert, answering questions appropriately, following commands Respiratory: unlabored breathing on room air, symmetric chest rise Psychiatric: appropriate affect, normal cadence to speech   MSK (  spine):  -Strength exam      Left  Right Grip strength                4/5  4/5 Interosseus   3/5   4/5 Wrist extension  4/5  4/5 Wrist flexion   4/5  4/5 Elbow flexion    5/5  5/5 Deltoid    5/5  5/5  EHL    5/5  5/5 TA    5/5  5/5 GSC    5/5  5/5 Knee extension  5/5  5/5 Hip flexion   4/5  4/5  Hip flexion strength limited bilaterally by back pain  -Sensory exam    Sensation intact to light touch in L3-S1 nerve distributions of bilateral lower extremities  Sensation intact to light touch in C5-T1 nerve distributions of bilateral upper extremities  -Brachioradialis DTR: 3/4 on the left, 2/4 on the right -Biceps DTR: 2/4 on the left, 2/4 on the right -Achilles DTR: 1/4 on the left, 1/4 on the right -Patellar tendon DTR: 1/4 on the left, 1/4 on the right  -Spurling: negative bilaterally -Hoffman sign: negative bilaterally -Clonus: no beats bilaterally -Interosseous wasting: observed bilaterally, more notable on the left -Grip and release test: positive -Romberg: positive -Gait: unable to perform due to instability -Imbalance with tandem gait: unable to perform due to instability  Left shoulder exam: no pain through range of motion Right shoulder exam: no pain through range of motion  Tinel's at wrist: negative bilaterally Phalen's at wrist: negative bilaterally Durkan's: negative bilaterally  Tinel's at elbow: negative bilaterally Phalen's at elbow: negative bilaterally  Imaging: XR of the cervical spine from 08/13/2022 was independently reviewed and interpreted, showing mutli-level facet arthropathy. Disc height loss and anterior osteophyte formation at C5/6.   XR of the lumbar spine from 07/30/2022 was independently reviewed and interpreted, showing L1 compression fracture. No other fracture seen. Spondylolisthesis at L4/5. Spondylolisthesis seen at C4/5 and C5/6 on the flexion views - shifts 1.40m and 1.71mrespectively between flexion and extension. No fracture seen.    Patient name: Jeffrey [name] MRN: [id number] of visit: 08/13/22

## 2022-08-14 ENCOUNTER — Other Ambulatory Visit: Payer: Self-pay | Admitting: Family Medicine

## 2022-08-14 DIAGNOSIS — R5381 Other malaise: Secondary | ICD-10-CM

## 2022-08-16 ENCOUNTER — Encounter: Payer: Self-pay | Admitting: Family Medicine

## 2022-08-17 ENCOUNTER — Ambulatory Visit
Admission: RE | Admit: 2022-08-17 | Discharge: 2022-08-17 | Disposition: A | Discharge status: Home / Self Care | Payer: PPO | Source: Ambulatory Visit | Attending: Orthopedic Surgery | Admitting: Orthopedic Surgery

## 2022-08-17 DIAGNOSIS — M4802 Spinal stenosis, cervical region: Secondary | ICD-10-CM | POA: Diagnosis not present

## 2022-08-17 DIAGNOSIS — M4712 Other spondylosis with myelopathy, cervical region: Secondary | ICD-10-CM

## 2022-08-17 DIAGNOSIS — M47812 Spondylosis without myelopathy or radiculopathy, cervical region: Secondary | ICD-10-CM | POA: Diagnosis not present

## 2022-08-18 ENCOUNTER — Ambulatory Visit (HOSPITAL_BASED_OUTPATIENT_CLINIC_OR_DEPARTMENT_OTHER): Payer: HMO | Admitting: Physical Therapy

## 2022-08-18 NOTE — Telephone Encounter (Signed)
See note

## 2022-08-20 ENCOUNTER — Encounter (HOSPITAL_BASED_OUTPATIENT_CLINIC_OR_DEPARTMENT_OTHER): Payer: HMO | Admitting: Physical Therapy

## 2022-08-21 ENCOUNTER — Other Ambulatory Visit (INDEPENDENT_AMBULATORY_CARE_PROVIDER_SITE_OTHER): Payer: PPO

## 2022-08-21 DIAGNOSIS — Z125 Encounter for screening for malignant neoplasm of prostate: Secondary | ICD-10-CM

## 2022-08-21 DIAGNOSIS — I152 Hypertension secondary to endocrine disorders: Secondary | ICD-10-CM | POA: Diagnosis not present

## 2022-08-21 DIAGNOSIS — E1159 Type 2 diabetes mellitus with other circulatory complications: Secondary | ICD-10-CM

## 2022-08-21 LAB — CBC
HCT: 41.2 % (ref 39.0–52.0)
Hemoglobin: 14.1 g/dL (ref 13.0–17.0)
MCHC: 34.1 g/dL (ref 30.0–36.0)
MCV: 94.7 fl (ref 78.0–100.0)
Platelets: 355 10*3/uL (ref 150.0–400.0)
RBC: 4.35 Mil/uL (ref 4.22–5.81)
RDW: 13.6 % (ref 11.5–15.5)
WBC: 9.6 10*3/uL (ref 4.0–10.5)

## 2022-08-21 LAB — COMPREHENSIVE METABOLIC PANEL
ALT: 39 U/L (ref 0–53)
AST: 23 U/L (ref 0–37)
Albumin: 4.3 g/dL (ref 3.5–5.2)
Alkaline Phosphatase: 86 U/L (ref 39–117)
BUN: 51 mg/dL — ABNORMAL HIGH (ref 6–23)
CO2: 25 mEq/L (ref 19–32)
Calcium: 9.2 mg/dL (ref 8.4–10.5)
Chloride: 104 mEq/L (ref 96–112)
Creatinine, Ser: 2.37 mg/dL — ABNORMAL HIGH (ref 0.40–1.50)
GFR: 27.6 mL/min — ABNORMAL LOW (ref >60.00)
Glucose, Bld: 142 mg/dL — ABNORMAL HIGH (ref 70–99)
Potassium: 5.2 mEq/L — ABNORMAL HIGH (ref 3.5–5.1)
Sodium: 140 mEq/L (ref 135–145)
Total Bilirubin: 0.6 mg/dL (ref 0.2–1.2)
Total Protein: 7 g/dL (ref 6.0–8.3)

## 2022-08-21 LAB — LIPID PANEL
Cholesterol: 114 mg/dL (ref 0–200)
HDL: 36.8 mg/dL — ABNORMAL LOW (ref >39.00)
LDL Cholesterol: 49 mg/dL (ref 0–99)
NonHDL: 77.3
Total CHOL/HDL Ratio: 3
Triglycerides: 144 mg/dL (ref 0.0–149.0)
VLDL: 28.8 mg/dL (ref 0.0–40.0)

## 2022-08-21 LAB — PSA: PSA: 3.24 ng/mL (ref 0.10–4.00)

## 2022-08-21 LAB — TSH: TSH: 3.46 u[IU]/mL (ref 0.35–5.50)

## 2022-08-25 ENCOUNTER — Encounter (HOSPITAL_BASED_OUTPATIENT_CLINIC_OR_DEPARTMENT_OTHER): Payer: HMO | Admitting: Physical Therapy

## 2022-08-26 ENCOUNTER — Ambulatory Visit (INDEPENDENT_AMBULATORY_CARE_PROVIDER_SITE_OTHER): Payer: PPO | Admitting: Family Medicine

## 2022-08-26 ENCOUNTER — Encounter: Payer: Self-pay | Admitting: Family Medicine

## 2022-08-26 VITALS — BP 143/78 | HR 74 | Temp 97.8°F | Ht 68.0 in

## 2022-08-26 DIAGNOSIS — I152 Hypertension secondary to endocrine disorders: Secondary | ICD-10-CM | POA: Diagnosis not present

## 2022-08-26 DIAGNOSIS — Z794 Long term (current) use of insulin: Secondary | ICD-10-CM | POA: Diagnosis not present

## 2022-08-26 DIAGNOSIS — N1832 Chronic kidney disease, stage 3b: Secondary | ICD-10-CM

## 2022-08-26 DIAGNOSIS — E1142 Type 2 diabetes mellitus with diabetic polyneuropathy: Secondary | ICD-10-CM

## 2022-08-26 DIAGNOSIS — E1159 Type 2 diabetes mellitus with other circulatory complications: Secondary | ICD-10-CM | POA: Diagnosis not present

## 2022-08-26 DIAGNOSIS — M47812 Spondylosis without myelopathy or radiculopathy, cervical region: Secondary | ICD-10-CM

## 2022-08-26 DIAGNOSIS — R5381 Other malaise: Secondary | ICD-10-CM

## 2022-08-26 LAB — POCT GLYCOSYLATED HEMOGLOBIN (HGB A1C): Hemoglobin A1C: 7.7 % — AB (ref 4.0–5.6)

## 2022-08-26 NOTE — Assessment & Plan Note (Signed)
A1c improved to 7.7.  He has done a good job with diet.  He will continue Lantus 36 units daily and NovoLog 3 units daily as needed for sugars greater than 200.  He will come back in 3 months and we will recheck his A1c.

## 2022-08-26 NOTE — Patient Instructions (Addendum)
It was very nice to see you today!  Your A1c today is 7.7. Please continue your current medication regimen.   Please follow with orthopedics soon.   It is okay for you to do physical therapy.  I will see you back in 3 months.  Come back sooner if needed.  Take care, Dr [name]  PLEASE NOTE:  If you had any lab tests, please let us know if you have not heard back within a few days. You may see your results on mychart before we have a chance to review them but we will give you a call once they are reviewed by [place].   If we ordered any referrals today, please let us know if you have not heard from their office within the next week.   If you had any urgent prescriptions sent in today, please check with the pharmacy within an hour of our visit to make sure the prescription was transmitted appropriately.   Please try these tips to maintain a healthy lifestyle:  Eat at least 3 REAL meals and 1-2 snacks per day.  Aim for no more than 5 hours between eating.  If you eat breakfast, please do so within one hour of getting up.   Each meal should contain half fruits/vegetables, one quarter protein, and one quarter carbs (no bigger than a computer mouse)  Cut down on sweet beverages. This includes juice, soda, and sweet tea.   Drink at least 1 glass of water with each meal and aim for at least 8 glasses per day  Exercise at least 150 minutes every week.

## 2022-08-26 NOTE — Progress Notes (Signed)
Please inform patient of the following:  Labs are all stable compared to previous values.  We can recheck everything again in 6 to 12 months.

## 2022-08-26 NOTE — Progress Notes (Signed)
   Jeffrey [name] is a 67 y.o. male who presents today for an office visit.  Assessment/Plan:  New/Acute Problems: Compression Fracture  Pain has mostly improved. He can continue using tylenol as needed.  He will follow-up with orthopedics in a couple of weeks.  Chronic Problems Addressed Today: Cervical spondylosis Recently had MRI that showed cervical spondylosis with mild to moderate foraminal stenosis at C3-4 and moderate foraminal stenosis at C6/7.  No signs of myelopathy.  He will follow-up with orthopedic soon to discuss next steps.  Physical debility Likely multifactorial.  Is following with orthopedics.  He does okay for him to resume physical therapy.  [name] A1c improved to 7.7.  He has done a good job with diet.  He will continue Lantus 36 units daily and NovoLog 3 units daily as needed for sugars greater than 200.  He will come back in 3 months and we will recheck his A1c.  Hypertension associated with diabetes ([name]) Mildly elevated today though typically well-controlled.  Continue Coreg 3.125 mg twice daily and spironolactone 25 mg daily.     Subjective:  HPI:  Patient here today for follow-up.  See A/P for status of chronic conditions we last saw him about a month ago.  Unfortunately since her last visit he fell at home and had a compression fracture in his back.  He has been following with orthopedics for this. He has been taking tylenol with has had some modest improvement. There was also some concern for myelopathy with his orthopedist and they obtained an MRI a week ago which showed cervical spondylosis with moderate to severe foraminal stenosis at c3-4 and moderate foraminal stenosis at c6-7. His pain has improved. Overall he is feeling like he is improving.   Doing well with rest of his chronic medications.  Sugars been at goal.  Has been working on his diet and limiting sugar intake  See A/P for status of chronic conditions.        Objective:  Physical Exam: BP (!) 143/78   Pulse 74   Temp 97.8 F (36.6 C) (Temporal)   Ht '5\' 8"'$  (1.727 m)   BMI 25.39 kg/m   Gen: No acute distress, resting comfortably CV: Regular rate and rhythm with no murmurs appreciated Pulm: Normal work of breathing, clear to auscultation bilaterally with no crackles, wheezes, or rhonchi Neuro: Grossly normal, moves all extremities Psych: Normal affect and thought content  Time Spent: 45 minutes of total time was spent on the date of the encounter performing the following actions: chart review prior to seeing the patient including recent visits with specialists, obtaining history, performing a medically necessary exam, counseling on the treatment plan, placing orders, and documenting in our EHR.        Jeffrey [name]. [name], MD 08/26/2022 10:42 AM

## 2022-08-26 NOTE — Assessment & Plan Note (Signed)
Recently had MRI that showed cervical spondylosis with mild to moderate foraminal stenosis at C3-4 and moderate foraminal stenosis at C6/7.  No signs of myelopathy.  He will follow-up with orthopedic soon to discuss next steps.

## 2022-08-26 NOTE — Assessment & Plan Note (Signed)
Mildly elevated today though typically well-controlled.  Continue Coreg 3.125 mg twice daily and spironolactone 25 mg daily.

## 2022-08-26 NOTE — Assessment & Plan Note (Signed)
Likely multifactorial.  Is following with orthopedics.  He does okay for him to resume physical therapy.

## 2022-08-27 ENCOUNTER — Telehealth: Payer: Self-pay | Admitting: Family Medicine

## 2022-08-27 ENCOUNTER — Other Ambulatory Visit: Payer: Self-pay | Admitting: *Deleted

## 2022-08-27 ENCOUNTER — Encounter: Payer: Self-pay | Admitting: Family Medicine

## 2022-08-27 DIAGNOSIS — G8929 Other chronic pain: Secondary | ICD-10-CM

## 2022-08-27 NOTE — Telephone Encounter (Signed)
Spoke with patient, requesting for home health referral be resend  Referral placed

## 2022-08-27 NOTE — Telephone Encounter (Signed)
Patient requests to be called re: discussion from [place] on 08/26/22 for [place]

## 2022-08-27 NOTE — Telephone Encounter (Signed)
Please check with lisa on status of referral.  Juniper Snyders M. [name], MD 08/27/2022 10:57 AM

## 2022-08-27 NOTE — Telephone Encounter (Signed)
Please advise 

## 2022-08-28 ENCOUNTER — Encounter (HOSPITAL_BASED_OUTPATIENT_CLINIC_OR_DEPARTMENT_OTHER): Payer: Self-pay | Admitting: Physical Therapy

## 2022-08-29 ENCOUNTER — Other Ambulatory Visit: Payer: Self-pay | Admitting: *Deleted

## 2022-08-29 ENCOUNTER — Encounter: Payer: Self-pay | Admitting: Family Medicine

## 2022-08-29 DIAGNOSIS — R5381 Other malaise: Secondary | ICD-10-CM

## 2022-08-29 NOTE — Telephone Encounter (Signed)
Home health order placed.

## 2022-08-30 ENCOUNTER — Other Ambulatory Visit: Payer: Self-pay | Admitting: Family Medicine

## 2022-08-31 DIAGNOSIS — E1159 Type 2 diabetes mellitus with other circulatory complications: Secondary | ICD-10-CM | POA: Diagnosis not present

## 2022-08-31 DIAGNOSIS — K219 Gastro-esophageal reflux disease without esophagitis: Secondary | ICD-10-CM | POA: Diagnosis not present

## 2022-08-31 DIAGNOSIS — I2510 Atherosclerotic heart disease of native coronary artery without angina pectoris: Secondary | ICD-10-CM | POA: Diagnosis not present

## 2022-08-31 DIAGNOSIS — Z9181 History of falling: Secondary | ICD-10-CM | POA: Diagnosis not present

## 2022-08-31 DIAGNOSIS — E1142 Type 2 diabetes mellitus with diabetic polyneuropathy: Secondary | ICD-10-CM | POA: Diagnosis not present

## 2022-08-31 DIAGNOSIS — N1832 Chronic kidney disease, stage 3b: Secondary | ICD-10-CM | POA: Diagnosis not present

## 2022-08-31 DIAGNOSIS — Z7982 Long term (current) use of aspirin: Secondary | ICD-10-CM | POA: Diagnosis not present

## 2022-08-31 DIAGNOSIS — Z794 Long term (current) use of insulin: Secondary | ICD-10-CM | POA: Diagnosis not present

## 2022-08-31 DIAGNOSIS — I152 Hypertension secondary to endocrine disorders: Secondary | ICD-10-CM | POA: Diagnosis not present

## 2022-08-31 DIAGNOSIS — M4802 Spinal stenosis, cervical region: Secondary | ICD-10-CM | POA: Diagnosis not present

## 2022-08-31 DIAGNOSIS — Z556 Problems related to health literacy: Secondary | ICD-10-CM | POA: Diagnosis not present

## 2022-08-31 DIAGNOSIS — M47812 Spondylosis without myelopathy or radiculopathy, cervical region: Secondary | ICD-10-CM | POA: Diagnosis not present

## 2022-08-31 DIAGNOSIS — E1122 Type 2 diabetes mellitus with diabetic chronic kidney disease: Secondary | ICD-10-CM | POA: Diagnosis not present

## 2022-09-01 ENCOUNTER — Encounter (HOSPITAL_BASED_OUTPATIENT_CLINIC_OR_DEPARTMENT_OTHER): Payer: HMO | Admitting: Physical Therapy

## 2022-09-01 NOTE — Telephone Encounter (Signed)
Referral  was send, copy printed and given to patient

## 2022-09-03 DIAGNOSIS — Z7982 Long term (current) use of aspirin: Secondary | ICD-10-CM | POA: Diagnosis not present

## 2022-09-03 DIAGNOSIS — M47812 Spondylosis without myelopathy or radiculopathy, cervical region: Secondary | ICD-10-CM | POA: Diagnosis not present

## 2022-09-03 DIAGNOSIS — M4802 Spinal stenosis, cervical region: Secondary | ICD-10-CM | POA: Diagnosis not present

## 2022-09-03 DIAGNOSIS — E1122 Type 2 diabetes mellitus with diabetic chronic kidney disease: Secondary | ICD-10-CM | POA: Diagnosis not present

## 2022-09-03 DIAGNOSIS — I2510 Atherosclerotic heart disease of native coronary artery without angina pectoris: Secondary | ICD-10-CM | POA: Diagnosis not present

## 2022-09-03 DIAGNOSIS — Z794 Long term (current) use of insulin: Secondary | ICD-10-CM | POA: Diagnosis not present

## 2022-09-03 DIAGNOSIS — Z9181 History of falling: Secondary | ICD-10-CM | POA: Diagnosis not present

## 2022-09-03 DIAGNOSIS — I152 Hypertension secondary to endocrine disorders: Secondary | ICD-10-CM | POA: Diagnosis not present

## 2022-09-03 DIAGNOSIS — E1159 Type 2 diabetes mellitus with other circulatory complications: Secondary | ICD-10-CM | POA: Diagnosis not present

## 2022-09-03 DIAGNOSIS — E1142 Type 2 diabetes mellitus with diabetic polyneuropathy: Secondary | ICD-10-CM | POA: Diagnosis not present

## 2022-09-03 DIAGNOSIS — Z556 Problems related to health literacy: Secondary | ICD-10-CM | POA: Diagnosis not present

## 2022-09-03 DIAGNOSIS — K219 Gastro-esophageal reflux disease without esophagitis: Secondary | ICD-10-CM | POA: Diagnosis not present

## 2022-09-03 DIAGNOSIS — N1832 Chronic kidney disease, stage 3b: Secondary | ICD-10-CM | POA: Diagnosis not present

## 2022-09-04 ENCOUNTER — Encounter (HOSPITAL_BASED_OUTPATIENT_CLINIC_OR_DEPARTMENT_OTHER): Payer: Self-pay | Admitting: Physical Therapy

## 2022-09-05 ENCOUNTER — Telehealth: Payer: Self-pay | Admitting: Family Medicine

## 2022-09-05 ENCOUNTER — Ambulatory Visit (HOSPITAL_BASED_OUTPATIENT_CLINIC_OR_DEPARTMENT_OTHER): Payer: HMO | Admitting: Cardiovascular Disease

## 2022-09-05 NOTE — Telephone Encounter (Signed)
FYI

## 2022-09-05 NOTE — Telephone Encounter (Signed)
Home Health Verbal Orders  Agency:  [facility]  Caller:   Contact and title  Reason for Request:  Pt refused PT this week but will do PT next week.   HH needs F2F w/in last 30 days

## 2022-09-08 ENCOUNTER — Telehealth: Payer: Self-pay | Admitting: Family Medicine

## 2022-09-08 NOTE — Telephone Encounter (Signed)
..  Home Health Certification or Plan of Care Tracking  Is this a Certification or Plan of Care?certification and [place] [facility]   Order Number:  [id number]  Has charge sheet been attached? Yes   Where has form been placed:  Dr Ellwood Handler folder   Faxed to:   [phone]

## 2022-09-09 ENCOUNTER — Encounter: Payer: Self-pay | Admitting: Family Medicine

## 2022-09-09 NOTE — Telephone Encounter (Signed)
Ok to placed lab order? 

## 2022-09-09 NOTE — Telephone Encounter (Signed)
Ok to order labs.  [name]. [name], MD 09/09/2022 10:07 AM

## 2022-09-10 DIAGNOSIS — E1122 Type 2 diabetes mellitus with diabetic chronic kidney disease: Secondary | ICD-10-CM | POA: Diagnosis not present

## 2022-09-10 DIAGNOSIS — M47812 Spondylosis without myelopathy or radiculopathy, cervical region: Secondary | ICD-10-CM | POA: Diagnosis not present

## 2022-09-10 DIAGNOSIS — I152 Hypertension secondary to endocrine disorders: Secondary | ICD-10-CM | POA: Diagnosis not present

## 2022-09-10 DIAGNOSIS — Z9181 History of falling: Secondary | ICD-10-CM | POA: Diagnosis not present

## 2022-09-10 DIAGNOSIS — Z794 Long term (current) use of insulin: Secondary | ICD-10-CM | POA: Diagnosis not present

## 2022-09-10 DIAGNOSIS — N1832 Chronic kidney disease, stage 3b: Secondary | ICD-10-CM | POA: Diagnosis not present

## 2022-09-10 DIAGNOSIS — M4802 Spinal stenosis, cervical region: Secondary | ICD-10-CM | POA: Diagnosis not present

## 2022-09-10 DIAGNOSIS — Z7982 Long term (current) use of aspirin: Secondary | ICD-10-CM | POA: Diagnosis not present

## 2022-09-10 DIAGNOSIS — I2510 Atherosclerotic heart disease of native coronary artery without angina pectoris: Secondary | ICD-10-CM | POA: Diagnosis not present

## 2022-09-10 DIAGNOSIS — Z556 Problems related to health literacy: Secondary | ICD-10-CM | POA: Diagnosis not present

## 2022-09-10 DIAGNOSIS — K219 Gastro-esophageal reflux disease without esophagitis: Secondary | ICD-10-CM | POA: Diagnosis not present

## 2022-09-10 DIAGNOSIS — E1142 Type 2 diabetes mellitus with diabetic polyneuropathy: Secondary | ICD-10-CM | POA: Diagnosis not present

## 2022-09-10 DIAGNOSIS — E1159 Type 2 diabetes mellitus with other circulatory complications: Secondary | ICD-10-CM | POA: Diagnosis not present

## 2022-09-11 DIAGNOSIS — K219 Gastro-esophageal reflux disease without esophagitis: Secondary | ICD-10-CM | POA: Diagnosis not present

## 2022-09-11 DIAGNOSIS — E1159 Type 2 diabetes mellitus with other circulatory complications: Secondary | ICD-10-CM | POA: Diagnosis not present

## 2022-09-11 DIAGNOSIS — E1122 Type 2 diabetes mellitus with diabetic chronic kidney disease: Secondary | ICD-10-CM | POA: Diagnosis not present

## 2022-09-11 DIAGNOSIS — Z9181 History of falling: Secondary | ICD-10-CM | POA: Diagnosis not present

## 2022-09-11 DIAGNOSIS — M47812 Spondylosis without myelopathy or radiculopathy, cervical region: Secondary | ICD-10-CM | POA: Diagnosis not present

## 2022-09-11 DIAGNOSIS — I2510 Atherosclerotic heart disease of native coronary artery without angina pectoris: Secondary | ICD-10-CM | POA: Diagnosis not present

## 2022-09-11 DIAGNOSIS — Z556 Problems related to health literacy: Secondary | ICD-10-CM | POA: Diagnosis not present

## 2022-09-11 DIAGNOSIS — Z794 Long term (current) use of insulin: Secondary | ICD-10-CM | POA: Diagnosis not present

## 2022-09-11 DIAGNOSIS — M4802 Spinal stenosis, cervical region: Secondary | ICD-10-CM | POA: Diagnosis not present

## 2022-09-11 DIAGNOSIS — Z7982 Long term (current) use of aspirin: Secondary | ICD-10-CM | POA: Diagnosis not present

## 2022-09-11 DIAGNOSIS — I152 Hypertension secondary to endocrine disorders: Secondary | ICD-10-CM | POA: Diagnosis not present

## 2022-09-11 DIAGNOSIS — E1142 Type 2 diabetes mellitus with diabetic polyneuropathy: Secondary | ICD-10-CM | POA: Diagnosis not present

## 2022-09-11 DIAGNOSIS — N1832 Chronic kidney disease, stage 3b: Secondary | ICD-10-CM | POA: Diagnosis not present

## 2022-09-12 ENCOUNTER — Ambulatory Visit (INDEPENDENT_AMBULATORY_CARE_PROVIDER_SITE_OTHER): Payer: PPO | Admitting: Orthopedic Surgery

## 2022-09-12 ENCOUNTER — Ambulatory Visit (INDEPENDENT_AMBULATORY_CARE_PROVIDER_SITE_OTHER): Payer: PPO

## 2022-09-12 ENCOUNTER — Encounter: Payer: Self-pay | Admitting: Orthopedic Surgery

## 2022-09-12 VITALS — BP 118/73 | HR 71 | Ht 67.0 in | Wt 167.0 lb

## 2022-09-12 DIAGNOSIS — G959 Disease of spinal cord, unspecified: Secondary | ICD-10-CM | POA: Diagnosis not present

## 2022-09-12 DIAGNOSIS — M549 Dorsalgia, unspecified: Secondary | ICD-10-CM

## 2022-09-12 DIAGNOSIS — G8929 Other chronic pain: Secondary | ICD-10-CM

## 2022-09-12 NOTE — Progress Notes (Signed)
Orthopedic Spine Surgery Office Note  Assessment: Patient is a 67 y.o. male with two issues:   Acute L1 compression (6 weeks from injury) Chronic, progressive loss of ambulatory ability hand dexterity issues for which I was concerned for compressive myelopathy, no evidence of compressive myelopathy on MRI   Plan: -Since his cervical MRI does not show any concerning findings for compressive myelopathy, ordered laboratory test to evaluate for some of the noncompressive myelopathy's.  Copper, zinc, vitamin B12, vitamin D were ordered today.  A referral was provided to neurology to work this up further as well -In regards to his lower back, he is making expected recovery.  He can continue use Tylenol as needed for pain relief.  I will see him back 1 more time at which point I will lift by 10 pound bending/lifting/twisting restriction -Patient should return to office in 6 weeks, x-rays at next visit: AP/lateral/flex/ex lumbar   Patient expressed understanding of the plan and all questions were answered to the patient's satisfaction.   ___________________________________________________________________________   History:  Patient is a 67 y.o. male who presents for follow-up on a L1 compression fracture and a cervical MRI.  In regards to his back pain, that has gotten significant better since the last time I saw him.  He says he only has occasional pain in his back.  He is only needing Tylenol when he has had occasional pain for pain relief.  He is not having any pain radiating to his lower extremities.  In regards to his neck, he describes symptoms of myelopathy at her last visit.  He had trouble with fine motor skills and coordination in his hands and has had weakness and difficulty with ambulation due to unsteadiness.  He has had no changes in the symptoms concerning for myelopathy since her last visit.  Treatments tried: physical therapy, ambulatory aids, tylenol    Physical  Exam:  General: no acute distress, appears stated age, in wheelchair Neurologic: alert, answering questions appropriately, following commands Respiratory: unlabored breathing on room air, symmetric chest rise Psychiatric: appropriate affect, normal cadence to speech   MSK (spine):  -Strength exam      Left  Right EHL    5/5  5/5 TA    5/5  5/5 GSC    5/5  5/5 Knee extension  5/5  5/5 Hip flexion   4/5  4/5  -Sensory exam    Sensation intact to light touch in L3-S1 nerve distributions of bilateral lower extremities  Imaging: XR of the lumbar spine from 09/12/2022 was independently reviewed and interpreted, showing L1 compression fracture with anterior height loss.  Slightly increased height loss from prior films on 07/30/2022.  No new fracture seen.  MRI of the cervical spine from 08/17/2022 was independently reviewed and interpreted, showing no T2 cord signal change.  No central stenosis.  Foraminal stenosis at C3/4 on the left.  Foraminal stenosis at C4/5 on the right.  Bilateral foraminal stenosis at C6/7.   Patient name: Jeffrey [name] Patient MRN: [id number] Date of visit: 09/12/22

## 2022-09-15 ENCOUNTER — Telehealth: Payer: Self-pay | Admitting: Orthopedic Surgery

## 2022-09-15 ENCOUNTER — Other Ambulatory Visit: Payer: Self-pay | Admitting: *Deleted

## 2022-09-15 DIAGNOSIS — Z7982 Long term (current) use of aspirin: Secondary | ICD-10-CM | POA: Diagnosis not present

## 2022-09-15 DIAGNOSIS — E1122 Type 2 diabetes mellitus with diabetic chronic kidney disease: Secondary | ICD-10-CM | POA: Diagnosis not present

## 2022-09-15 DIAGNOSIS — E1159 Type 2 diabetes mellitus with other circulatory complications: Secondary | ICD-10-CM

## 2022-09-15 DIAGNOSIS — I2510 Atherosclerotic heart disease of native coronary artery without angina pectoris: Secondary | ICD-10-CM | POA: Diagnosis not present

## 2022-09-15 DIAGNOSIS — E1142 Type 2 diabetes mellitus with diabetic polyneuropathy: Secondary | ICD-10-CM | POA: Diagnosis not present

## 2022-09-15 DIAGNOSIS — R7989 Other specified abnormal findings of blood chemistry: Secondary | ICD-10-CM

## 2022-09-15 DIAGNOSIS — M47812 Spondylosis without myelopathy or radiculopathy, cervical region: Secondary | ICD-10-CM | POA: Diagnosis not present

## 2022-09-15 DIAGNOSIS — Z794 Long term (current) use of insulin: Secondary | ICD-10-CM | POA: Diagnosis not present

## 2022-09-15 DIAGNOSIS — Z9181 History of falling: Secondary | ICD-10-CM | POA: Diagnosis not present

## 2022-09-15 DIAGNOSIS — M4802 Spinal stenosis, cervical region: Secondary | ICD-10-CM | POA: Diagnosis not present

## 2022-09-15 DIAGNOSIS — N1832 Chronic kidney disease, stage 3b: Secondary | ICD-10-CM | POA: Diagnosis not present

## 2022-09-15 DIAGNOSIS — K219 Gastro-esophageal reflux disease without esophagitis: Secondary | ICD-10-CM | POA: Diagnosis not present

## 2022-09-15 DIAGNOSIS — I152 Hypertension secondary to endocrine disorders: Secondary | ICD-10-CM | POA: Diagnosis not present

## 2022-09-15 DIAGNOSIS — Z556 Problems related to health literacy: Secondary | ICD-10-CM | POA: Diagnosis not present

## 2022-09-15 NOTE — Telephone Encounter (Signed)
Patient came by. Says he would like to have his blood work done at his primary care office. [facility] had a 2 hour wait each time he went. His call back number is [phone]

## 2022-09-15 NOTE — Telephone Encounter (Signed)
Lab placed, patient aware

## 2022-09-15 NOTE — Telephone Encounter (Signed)
Form was faxed ib 09/12/2022 [phone]

## 2022-09-16 ENCOUNTER — Telehealth: Payer: Self-pay | Admitting: Family Medicine

## 2022-09-16 NOTE — Telephone Encounter (Signed)
..  Home Health Verbal Orders  Agency:  [place]: Donella Stade, [place]  [phone]  Requesting OT/ PT/ Skilled nursing/ Social Work/ Speech:  Skilled Nursing   Reason for Request:  Work on bathing and ADLs  Frequency:  Twice a week x 3 weeks

## 2022-09-16 NOTE — Telephone Encounter (Signed)
I called and advised that this is fine as long as they make sure that Dr. Laurance Flatten gets the results. And the brother understands this.

## 2022-09-17 NOTE — Telephone Encounter (Signed)
Ok to give VO?

## 2022-09-18 ENCOUNTER — Other Ambulatory Visit: Payer: PPO

## 2022-09-18 NOTE — Telephone Encounter (Signed)
Ok with me. Please place any necessary orders. 

## 2022-09-19 NOTE — Telephone Encounter (Signed)
Called Crystal at [phone] VO given

## 2022-09-22 ENCOUNTER — Other Ambulatory Visit (INDEPENDENT_AMBULATORY_CARE_PROVIDER_SITE_OTHER): Payer: PPO

## 2022-09-22 DIAGNOSIS — R7989 Other specified abnormal findings of blood chemistry: Secondary | ICD-10-CM | POA: Diagnosis not present

## 2022-09-22 DIAGNOSIS — I152 Hypertension secondary to endocrine disorders: Secondary | ICD-10-CM | POA: Diagnosis not present

## 2022-09-22 DIAGNOSIS — E1159 Type 2 diabetes mellitus with other circulatory complications: Secondary | ICD-10-CM | POA: Diagnosis not present

## 2022-09-22 LAB — VITAMIN B12: Vitamin B-12: 639 pg/mL (ref 211–911)

## 2022-09-22 LAB — HEMOGLOBIN A1C: Hgb A1c MFr Bld: 8.5 % — ABNORMAL HIGH (ref 4.6–6.5)

## 2022-09-23 NOTE — Progress Notes (Signed)
Please inform patient of the following:  His A1c is up a little bit. It is ok for him to increase his lantus slightly to 38 units. Would like for them to let us know how his sugars are reading in a few days.  His B12 level is normal.  Donnalynn Wheeless M. [name], MD 09/23/2022 8:40 AM

## 2022-09-24 ENCOUNTER — Telehealth: Payer: Self-pay | Admitting: *Deleted

## 2022-09-24 NOTE — Telephone Encounter (Signed)
Patient requesting lab results done on 09/22/2022 No results on chart

## 2022-09-25 ENCOUNTER — Telehealth: Payer: Self-pay

## 2022-09-25 NOTE — Telephone Encounter (Signed)
Copper, Vitamin E, and Zinc are in process. They are shipped and tested in [place]. It will be a couple days for test results.

## 2022-09-26 ENCOUNTER — Telehealth: Payer: Self-pay | Admitting: Orthopedic Surgery

## 2022-09-26 ENCOUNTER — Other Ambulatory Visit: Payer: Self-pay | Admitting: *Deleted

## 2022-09-26 DIAGNOSIS — R2689 Other abnormalities of gait and mobility: Secondary | ICD-10-CM

## 2022-09-26 DIAGNOSIS — R278 Other lack of coordination: Secondary | ICD-10-CM

## 2022-09-26 DIAGNOSIS — R5381 Other malaise: Secondary | ICD-10-CM

## 2022-09-26 NOTE — Telephone Encounter (Signed)
Patient called about wanting to start PT at [facility] and wanted a referral sent over he would like a call notifying him on decision

## 2022-09-29 ENCOUNTER — Encounter: Payer: Self-pay | Admitting: Family Medicine

## 2022-09-29 NOTE — Telephone Encounter (Signed)
I called and lmom that his order was placed and that someone will call him back to schedule his appt

## 2022-09-29 NOTE — Addendum Note (Signed)
Addended by: Ileene Rubens on: 09/29/2022 08:12 AM   Modules accepted: Orders

## 2022-09-30 DIAGNOSIS — E1159 Type 2 diabetes mellitus with other circulatory complications: Secondary | ICD-10-CM | POA: Diagnosis not present

## 2022-09-30 DIAGNOSIS — M47812 Spondylosis without myelopathy or radiculopathy, cervical region: Secondary | ICD-10-CM | POA: Diagnosis not present

## 2022-09-30 DIAGNOSIS — N1832 Chronic kidney disease, stage 3b: Secondary | ICD-10-CM | POA: Diagnosis not present

## 2022-09-30 DIAGNOSIS — I152 Hypertension secondary to endocrine disorders: Secondary | ICD-10-CM | POA: Diagnosis not present

## 2022-09-30 DIAGNOSIS — Z794 Long term (current) use of insulin: Secondary | ICD-10-CM | POA: Diagnosis not present

## 2022-09-30 DIAGNOSIS — Z9181 History of falling: Secondary | ICD-10-CM | POA: Diagnosis not present

## 2022-09-30 DIAGNOSIS — E1142 Type 2 diabetes mellitus with diabetic polyneuropathy: Secondary | ICD-10-CM | POA: Diagnosis not present

## 2022-09-30 DIAGNOSIS — M4802 Spinal stenosis, cervical region: Secondary | ICD-10-CM | POA: Diagnosis not present

## 2022-09-30 DIAGNOSIS — Z7982 Long term (current) use of aspirin: Secondary | ICD-10-CM | POA: Diagnosis not present

## 2022-09-30 DIAGNOSIS — K219 Gastro-esophageal reflux disease without esophagitis: Secondary | ICD-10-CM | POA: Diagnosis not present

## 2022-09-30 DIAGNOSIS — Z556 Problems related to health literacy: Secondary | ICD-10-CM | POA: Diagnosis not present

## 2022-09-30 DIAGNOSIS — I2510 Atherosclerotic heart disease of native coronary artery without angina pectoris: Secondary | ICD-10-CM | POA: Diagnosis not present

## 2022-09-30 DIAGNOSIS — E1122 Type 2 diabetes mellitus with diabetic chronic kidney disease: Secondary | ICD-10-CM | POA: Diagnosis not present

## 2022-10-02 ENCOUNTER — Other Ambulatory Visit: Payer: Self-pay | Admitting: Family Medicine

## 2022-10-02 NOTE — Progress Notes (Signed)
Please inform patient of the following:  Vitamin E level is normal. We are still waiting on copper and zinc levels.  [name]. [name], MD 10/02/2022 10:50 AM

## 2022-10-07 LAB — VITAMIN E
Gamma-Tocopherol (Vit E): 1.1 mg/L (ref <4.4)
Vitamin E (Alpha Tocopherol): 9.4 mg/L (ref 5.7–19.9)

## 2022-10-07 LAB — ZINC: Zinc: 71 ug/dL (ref 60–130)

## 2022-10-07 LAB — COPPER, SERUM: Copper: 84 ug/dL (ref 70–175)

## 2022-10-07 NOTE — Progress Notes (Signed)
Please inform patient of the following:  Labs are all normal.  Joelle Roswell M. [name], MD 10/07/2022 1:24 PM

## 2022-10-07 NOTE — Telephone Encounter (Signed)
See results note. 

## 2022-10-22 DIAGNOSIS — H2513 Age-related nuclear cataract, bilateral: Secondary | ICD-10-CM | POA: Diagnosis not present

## 2022-10-22 DIAGNOSIS — H3582 Retinal ischemia: Secondary | ICD-10-CM | POA: Diagnosis not present

## 2022-10-22 DIAGNOSIS — E113513 Type 2 diabetes mellitus with proliferative diabetic retinopathy with macular edema, bilateral: Secondary | ICD-10-CM | POA: Diagnosis not present

## 2022-10-22 DIAGNOSIS — H31093 Other chorioretinal scars, bilateral: Secondary | ICD-10-CM | POA: Diagnosis not present

## 2022-10-24 ENCOUNTER — Other Ambulatory Visit (INDEPENDENT_AMBULATORY_CARE_PROVIDER_SITE_OTHER): Payer: PPO

## 2022-10-24 ENCOUNTER — Ambulatory Visit (INDEPENDENT_AMBULATORY_CARE_PROVIDER_SITE_OTHER): Payer: PPO | Admitting: Orthopedic Surgery

## 2022-10-24 DIAGNOSIS — M549 Dorsalgia, unspecified: Secondary | ICD-10-CM

## 2022-10-24 DIAGNOSIS — G8929 Other chronic pain: Secondary | ICD-10-CM | POA: Diagnosis not present

## 2022-10-24 DIAGNOSIS — R2681 Unsteadiness on feet: Secondary | ICD-10-CM

## 2022-10-24 NOTE — Progress Notes (Signed)
Orthopedic Spine Surgery Office Note  Assessment: Patient is a 67 y.o. male with two issues:   Acute L1 compression (12 weeks from injury) Chronic, progressive loss of ambulatory ability hand dexterity issues for which I was concerned for compressive myelopathy, no evidence of compressive myelopathy on MRI     Plan: -I reviewed his blood work including B12, serum copper, vitamin E, zinc labs that were drawn.  These vitamins/minerals were within normal limits.  I do not have an explanation for his loss of ambulatory status and hand dexterity issues.  A referral was again provided to neurology -Can continue with Tylenol as needed for soreness  -No spine specific precautions at this point -Patient should return to the office on an as-needed basis   Patient expressed understanding of the plan and all questions were answered to the patient's satisfaction.   ___________________________________________________________________________  History: Patient is a 67 y.o. male who has been previously seen in the office for an L1 compression fracture.  He states that his back is no longer hurting him.  It sometimes is sore and he has to take an extra strength Tylenol.  This is not an every day occurrence.  He feels that he has had much improvement in terms of his back pain.  In the office, we have previously talked about his loss of ambulatory status and decreased hand dexterity.  We have worked it up with MRI and vitamin/mineral lab test.  I recommended a neurology appointment at our last visit, but patient did not return the phone calls because they are waiting to see me again.  Previous treatments: physical therapy, ambulatory aids, tylenol     Physical Exam:  General: no acute distress, appears stated age Neurologic: alert, answering questions appropriately, following commands Respiratory: unlabored breathing on room air, symmetric chest rise Psychiatric: appropriate affect, normal cadence to  speech   MSK (spine):  -Strength exam      Left  Right EHL    5/5  5/5 TA    5/5  5/5 GSC    5/5  5/5 Knee extension  5/5  5/5 Hip flexion   4/5  4/5  -Sensory exam    Sensation intact to light touch in L3-S1 nerve distributions of bilateral lower extremities  -Straight leg raise: Negative bilaterally -Clonus: no beats bilaterally  Imaging: XR of the lumbar spine from 10/24/2022 was independently reviewed and interpreted, showing L1 compression fracture with anterior height loss.  Appears similar to prior films.  Alignment maintained.  No new fractures seen.   Patient name: Jeffrey [name] Patient MRN: [id number] Date of visit: 10/24/22

## 2022-10-27 ENCOUNTER — Encounter: Payer: Self-pay | Admitting: Neurology

## 2022-10-29 ENCOUNTER — Ambulatory Visit (HOSPITAL_BASED_OUTPATIENT_CLINIC_OR_DEPARTMENT_OTHER): Payer: PPO | Attending: Family Medicine | Admitting: Physical Therapy

## 2022-10-29 ENCOUNTER — Encounter (HOSPITAL_BASED_OUTPATIENT_CLINIC_OR_DEPARTMENT_OTHER): Payer: Self-pay | Admitting: Physical Therapy

## 2022-10-29 ENCOUNTER — Other Ambulatory Visit: Payer: Self-pay

## 2022-10-29 DIAGNOSIS — M6281 Muscle weakness (generalized): Secondary | ICD-10-CM | POA: Insufficient documentation

## 2022-10-29 DIAGNOSIS — R278 Other lack of coordination: Secondary | ICD-10-CM | POA: Diagnosis not present

## 2022-10-29 DIAGNOSIS — R2689 Other abnormalities of gait and mobility: Secondary | ICD-10-CM | POA: Insufficient documentation

## 2022-10-29 DIAGNOSIS — R262 Difficulty in walking, not elsewhere classified: Secondary | ICD-10-CM | POA: Diagnosis not present

## 2022-10-29 NOTE — Therapy (Signed)
OUTPATIENT PHYSICAL THERAPY LOWER EXTREMITY EVALUATION   Patient Name: Jeffrey [name] MRN: [id number] DOB:[birth year], 67 y.o., male Today's Date: 10/30/2022  END OF SESSION:  PT End of Session - 10/29/22 1545     Visit Number 1    Number of Visits 24    Date for PT Re-Evaluation 01/21/23    Authorization Type HTA    PT Start Time 1145    PT Stop Time 1229    PT Time Calculation (min) 44 min    Activity Tolerance Patient tolerated treatment well    Behavior During Therapy [facility] for tasks assessed/performed             Past Medical History:  Diagnosis Date   Cardiac arrest ([place]) 05/22/2016   Cataract    CHF (congestive heart failure) ([name])    CKD (chronic kidney disease) stage 3, GFR 30-59 ml/min ([place]) 05/28/2021   Diabetes mellitus without complication ([place])    type 2   Diabetic retinopathy ([place])    Hyperlipidemia    Hypertension    Memory loss    mild   Myocardial infarct ([place]) 2105   Retinopathy    Both eyes   S/P primary angioplasty with coronary stent 05/22/2016   Vitamin B12 deficiency    Vitreous hemorrhage of left eye ([place])    proliferative diabetic retinopathy   Past Surgical History:  Procedure Laterality Date   ANGIOPLASTY     2015   COLONOSCOPY     multiple   EYE SURGERY     PARS PLANA VITRECTOMY Left 03/15/2020   Procedure: PARS PLANA VITRECTOMY WITH 25 GAUGE;  Surgeon: [name], MD;  Location: [place];  Service: Ophthalmology;  Laterality: Left;   PHOTOCOAGULATION WITH LASER Left 03/15/2020   Procedure: PHOTOCOAGULATION WITH LASER; INTRAVITREAL INJECTION OF AVASTIN;  Surgeon: [name], MD;  Location: [place];  Service: Ophthalmology;  Laterality: Left;   REFRACTIVE SURGERY     UPPER GI ENDOSCOPY     several   VITRECTOMY     Patient Active Problem List   Diagnosis Date Noted   Cervical spondylosis 08/26/2022   CKD (chronic kidney disease) stage 3, GFR 30-59 ml/min ([name]) 05/28/2021   Adjustment disorder 01/22/2021   Chronic back pain  10/22/2020   Peripheral vertigo 05/15/2020   Dermatitis 01/04/2020   Unintentional weight loss with loose stools 05/19/2019   Low vitamin B12 level 03/29/2019   Heme positive stool 03/14/2019   Normocytic anemia 03/14/2019   Chronic diastolic heart failure ([place]) 02/08/2019   Diabetic retinopathy ([place]) 09/08/2018   Physical debility 05/24/2018   Varicose veins of both lower extremities 03/01/2018   Fatigue due to exertion 10/14/2017   GERD (gastroesophageal reflux disease) 08/25/2017   CAD in native artery 04/14/2017   Hyperlipidemia associated with type 2 diabetes mellitus ([place]) 04/14/2017   Diabetic peripheral neuropathy associated with type 2 diabetes mellitus ([place]) 08/17/2016   T2DM (type 2 diabetes mellitus) ([place]) 05/02/2016   Hypertension associated with diabetes ([place]) 05/02/2016    PCP: Dimas Chyle   REFERRING PROVIDER:  Ileene Rubens A MD   REFERRING DIAG: Diagnosis  R26.89 (ICD-10-CM) - Imbalance  R27.8 (ICD-10-CM) - Decreased dexterity    THERAPY DIAG:  Other abnormalities of gait and mobility  Difficulty in walking, not elsewhere classified  Muscle weakness (generalized)  Rationale for Evaluation and Treatment: Rehabilitation  ONSET DATE:   SUBJECTIVE:   SUBJECTIVE STATEMENT: The patient has a long history of gait abnormality beginning back in 2022. He has a history of  CAD and believes that his debility began following a covid infection. He had an episode of physical therapy in 2022 in which he improved but was unable to continue with his exercises following completion. In December odf 2022 he fell and suffered a compression fracture of L1. Per MD report it is healing well. He is having no significant low back pain at this time. His MD suspected myelopathy 2nd to muscle wasting in his bilateral thenar area. All tests so far have come back negative. He will be seeing a neurologist in the coming weeks. He is using a walker for primary mobility at this time. He  reports he only leaves the house to go to MD appointments. He has had no further falls since December.  PERTINENT HISTORY: CHF, CKD, DMII, Cardiac Arrest 05/22/2016 ; retinopathy, B12  PAIN:  Are you having pain? Yes: NPRS scale: 5/10 Pain location: across his hip Pain description: aching  Aggravating factors: night time is the worse Relieving factors: sitting down and resting  PRECAUTIONS: Fall  WEIGHT BEARING RESTRICTIONS: No  FALLS:  Has patient fallen in last 6 months? No  LIVING ENVIRONMENT: Lives with:  brother Lives in: House/apartment Stairs: No Has following equipment at home: Environmental consultant - 4 wheeled  OCCUPATION: Retired  PLOF: Requires assistive device for independence, Needs assistance with ADLs, Needs assistance with homemaking, Needs assistance with gait, and Needs assistance with transfers  PATIENT GOALS:   To walk better   NEXT MD VISIT:  April   OBJECTIVE:   DIAGNOSTIC FINDINGS:   PATIENT SURVEYS:     COGNITION: Overall cognitive status: Within functional limits for tasks assessed     SENSATION: Bilateral hand numbness    POSTURE: forward head and flexed trunk       UE ROM and strength WNL  LOWER EXTREMITY MMT:  MMT Right eval Left eval  Hip flexion 17 18  Hip extension    Hip abduction 12.9 9.9  Hip adduction    Hip internal rotation    Hip external rotation    Knee flexion    Knee extension 18.2 13.2  Ankle dorsiflexion 19.3 16.8  Ankle plantarflexion    Ankle inversion    Ankle eversion     (Blank rows = not tested)    FUNCTIONAL TESTS:  5 times sit to stand: 2 in 20 seconds  Pt was able to stand for 36 sec with assistance close contact guard. Fear of falling again.  GAIT: Distance walked: 28ft Assistive device utilized: Environmental consultant - 4 wheeled As he walked he had difficulty flexing his lower extremity, especially in the last couple of feet. Comments: Has reduced gait velocity Balance: Minimal assistance normal  base Narrow base,eyes closed: contact guard    TODAY'S TREATMENT:                                                                                                                              DATE:  3/20  PATIENT EDUCATION:  Education details: Review HEP/POC  Person educated: Patient and brother Education method: Explanation, Demonstration, Tactile cues, Verbal cues, and Handouts Education comprehension: verbalized understanding, returned demonstration, verbal cues required, tactile cues required, and needs further education  HOME EXERCISE PROGRAM: Seated hip abduction with yellow band 2x10 Seated marches 2x10  Seated knee extension 2x10  Seated isometric adduction ball squeeze  ASSESSMENT:  CLINICAL IMPRESSION: Patient is a 67 y.o. male who was seen today for physical therapy evaluation and treatment for generalized weakness and reduced loss of balance. Was unable to stand for longer than 30 seconds, requires close contact guard. Patient performed 2 sit to stand in 20 seconds. He presents with weakness in all major muscle groups per dynometric testing.  Patient is able to stand for 36 seconds with CGA. He has decreased standing balance. Patient will benefit from skilled PT to regain strength and be able to move around in the community and get back to ADLs. Patient is eager and determine to get back to moving more freely and be more independent.     OBJECTIVE IMPAIRMENTS: Abnormal gait, decreased activity tolerance, decreased balance, decreased endurance, and decreased mobility. Decreased Strength;   ACTIVITY LIMITATIONS: carrying, lifting, standing, squatting, and stairs walking on level ground, dressing,  PARTICIPATION LIMITATIONS: driving, community activity, occupation, and yard work  PERSONAL FACTORS: CHF, CKD, DMII, Cardiac Arrest 05/22/2016 ; retinopathy, B12 are also affecting patient's functional outcome.   REHAB POTENTIAL: Good  CLINICAL DECISION MAKING: Unstable  declining general mobility   EVALUATION COMPLEXITY: High    GOALS: Goals reviewed with patient? Yes  SHORT TERM GOALS: Target date:12/10/2022   Patient will be able to ambulate 75 ft until they need a break. Baseline: Goal status: INITIAL  2.  Patient will perform 5x sit to stand test 5x  Baseline:  Goal status: INITIAL  3.  Patient will be able to stand for greater than 50 seconds without UE assist.  Baseline:  Goal status: INITIAL  4.  Patient will increase gross bilateral LE strength by 5 lbs  Baseline:  Goal status: INITIAL     LONG TERM GOALS: Target date: 11/26/2022  Patient will increase overall strength by 10 lbs in order to improve ability to transfer  Baseline:  Goal status: INITIAL  2.  Patient will be able to walk community distances with increased feeling of safety and balance  Baseline:  Goal status: INITIAL  3.  Patient will have comprehensive HEP that will improve his ability to perform ADL's and IADL's  Goal status: INITIAL      PLAN:  PT FREQUENCY: 1-2x/week  PT DURATION: 12 weeks   PLANNED INTERVENTIONS: Therapeutic exercises, Therapeutic activity, Neuromuscular re-education, Balance training, Gait training, Patient/Family education, Self Care, Joint mobilization, Stair training, DME instructions, Aquatic Therapy, Electrical stimulation, Cryotherapy, Moist heat, Manual therapy, and Re-evaluation  PLAN FOR NEXT SESSION: Based on how the patient presents we will start off by doing his HEP. We will modify them based on how he feels and increase resistance as needed. If patient tolerated HEP well we can add quad sets along with SAQ. If patient comes back feeling better we can do a few minutes on the Nu-step to get his heart rate up. We can also start working on his balance, eyes closed and opening standing balance.    Carney Living, PT 10/30/2022, 8:36 AM

## 2022-10-30 ENCOUNTER — Encounter (HOSPITAL_BASED_OUTPATIENT_CLINIC_OR_DEPARTMENT_OTHER): Payer: Self-pay | Admitting: Physical Therapy

## 2022-11-03 ENCOUNTER — Ambulatory Visit (HOSPITAL_BASED_OUTPATIENT_CLINIC_OR_DEPARTMENT_OTHER): Payer: PPO | Admitting: Physical Therapy

## 2022-11-03 DIAGNOSIS — M6281 Muscle weakness (generalized): Secondary | ICD-10-CM | POA: Diagnosis not present

## 2022-11-03 DIAGNOSIS — R262 Difficulty in walking, not elsewhere classified: Secondary | ICD-10-CM

## 2022-11-03 DIAGNOSIS — R2689 Other abnormalities of gait and mobility: Secondary | ICD-10-CM

## 2022-11-03 NOTE — Therapy (Signed)
OUTPATIENT PHYSICAL THERAPY LOWER EXTREMITY EVALUATION   Patient Name: Jeffrey [name] MRN: [id number] DOB:May 28, 1956, 67 y.o., male Today's Date: 10/30/2022  END OF SESSION:  PT End of Session - 10/29/22 1545     Visit Number 1    Number of Visits 24    Date for PT Re-Evaluation 01/21/23    Authorization Type HTA    PT Start Time 1145    PT Stop Time 1229    PT Time Calculation (min) 44 min    Activity Tolerance Patient tolerated treatment well    Behavior During Therapy [facility] for tasks assessed/performed             Past Medical History:  Diagnosis Date   Cardiac arrest ([place]) 05/22/2016   Cataract    CHF (congestive heart failure) ([name])    CKD (chronic kidney disease) stage 3, GFR 30-59 ml/min ([place]) 05/28/2021   Diabetes mellitus without complication ([place])    type 2   Diabetic retinopathy ([place])    Hyperlipidemia    Hypertension    Memory loss    mild   Myocardial infarct ([place]) 2105   Retinopathy    Both eyes   S/P primary angioplasty with coronary stent 05/22/2016   Vitamin B12 deficiency    Vitreous hemorrhage of left eye ([place])    proliferative diabetic retinopathy   Past Surgical History:  Procedure Laterality Date   ANGIOPLASTY     2015   COLONOSCOPY     multiple   EYE SURGERY     PARS PLANA VITRECTOMY Left 03/15/2020   Procedure: PARS PLANA VITRECTOMY WITH 25 GAUGE;  Surgeon: [name], MD;  Location: [place];  Service: Ophthalmology;  Laterality: Left;   PHOTOCOAGULATION WITH LASER Left 03/15/2020   Procedure: PHOTOCOAGULATION WITH LASER; INTRAVITREAL INJECTION OF AVASTIN;  Surgeon: [name], MD;  Location: ;  Service: Ophthalmology;  Laterality: Left;   REFRACTIVE SURGERY     UPPER GI ENDOSCOPY     several   VITRECTOMY     Patient Active Problem List   Diagnosis Date Noted   Cervical spondylosis 08/26/2022   CKD (chronic kidney disease) stage 3, GFR 30-59 ml/min ([name]) 05/28/2021   Adjustment disorder 01/22/2021   Chronic back pain  10/22/2020   Peripheral vertigo 05/15/2020   Dermatitis 01/04/2020   Unintentional weight loss with loose stools 05/19/2019   Low vitamin B12 level 03/29/2019   Heme positive stool 03/14/2019   Normocytic anemia 03/14/2019   Chronic diastolic heart failure ([place]) 02/08/2019   Diabetic retinopathy ([place]) 09/08/2018   Physical debility 05/24/2018   Varicose veins of both lower extremities 03/01/2018   Fatigue due to exertion 10/14/2017   GERD (gastroesophageal reflux disease) 08/25/2017   CAD in native artery 04/14/2017   Hyperlipidemia associated with type 2 diabetes mellitus ([place]) 04/14/2017   Diabetic peripheral neuropathy associated with type 2 diabetes mellitus ([place]) 08/17/2016   T2DM (type 2 diabetes mellitus) ([place]) 05/02/2016   Hypertension associated with diabetes ([place]) 05/02/2016    PCP: Dimas Chyle   REFERRING PROVIDER:  Ileene Rubens A MD   REFERRING DIAG: Diagnosis  R26.89 (ICD-10-CM) - Imbalance  R27.8 (ICD-10-CM) - Decreased dexterity    THERAPY DIAG:  Other abnormalities of gait and mobility  Difficulty in walking, not elsewhere classified  Muscle weakness (generalized)  Rationale for Evaluation and Treatment: Rehabilitation  ONSET DATE:   SUBJECTIVE:   SUBJECTIVE STATEMENT:  Patient was not feeling the best this morning. Was fatigue from his shower, got water in his  eye, which seemed to bother him a bit. Had to come in with a wheelchair, since he did not feel stable with a walker.  The patient has a long history of gait abnormality beginning back in 2022. He has a history of CAD and believes that his debility began following a covid infection. He had an episode of physical therapy in 2022 in which he improved but was unable to continue with his exercises following completion. In December odf 2022 he fell and suffered a compression fracture of L1. Per MD report it is healing well. He is having no significant low back pain at this time. His MD suspected  myelopathy 2nd to muscle wasting in his bilateral thenar area. All tests so far have come back negative. He will be seeing a neurologist in the coming weeks. He is using a walker for primary mobility at this time. He reports he only leaves the house to go to MD appointments. He has had no further falls since December.  PERTINENT HISTORY: CHF, CKD, DMII, Cardiac Arrest 05/22/2016 ; retinopathy, B12  PAIN:  Are you having pain? Yes: NPRS scale: 5/10 Pain location: across his hip Pain description: aching  Aggravating factors: night time is the worse Relieving factors: sitting down and resting  PRECAUTIONS: Fall  WEIGHT BEARING RESTRICTIONS: No  FALLS:  Has patient fallen in last 6 months? No  LIVING ENVIRONMENT: Lives with:  brother Lives in: House/apartment Stairs: No Has following equipment at home: Environmental consultant - 4 wheeled  OCCUPATION: Retired  PLOF: Requires assistive device for independence, Needs assistance with ADLs, Needs assistance with homemaking, Needs assistance with gait, and Needs assistance with transfers  PATIENT GOALS:   To walk better   NEXT MD VISIT:  April   OBJECTIVE:   DIAGNOSTIC FINDINGS:   PATIENT SURVEYS:     COGNITION: Overall cognitive status: Within functional limits for tasks assessed     SENSATION: Bilateral hand numbness    POSTURE: forward head and flexed trunk       UE ROM and strength WNL  LOWER EXTREMITY MMT:  MMT Right eval Left eval  Hip flexion 17 18  Hip extension    Hip abduction 12.9 9.9  Hip adduction    Hip internal rotation    Hip external rotation    Knee flexion    Knee extension 18.2 13.2  Ankle dorsiflexion 19.3 16.8  Ankle plantarflexion    Ankle inversion    Ankle eversion     (Blank rows = not tested)    FUNCTIONAL TESTS:  5 times sit to stand: 2 in 20 seconds  Pt was able to stand for 36 sec with assistance close contact guard. Fear of falling again.  GAIT: Distance walked: 78ft Assistive  device utilized: Environmental consultant - 4 wheeled As he walked he had difficulty flexing his lower extremity, especially in the last couple of feet. Comments: Has reduced gait velocity Balance: Minimal assistance normal base Narrow base,eyes closed: contact guard    TODAY'S TREATMENT:  DATE:    3/25 Nu step did 3x 2 min interval  Seated bicep curl 3x10 1lb Seated chest press 3x10 1lb  Seated yellow band hip abduction 4x10 Seated knee extension 2x10     3/20  PATIENT EDUCATION:  Education details: Review HEP/POC  Person educated: Patient and brother Education method: Consulting civil engineer, Demonstration, Tactile cues, Verbal cues, and Handouts Education comprehension: verbalized understanding, returned demonstration, verbal cues required, tactile cues required, and needs further education  HOME EXERCISE PROGRAM: Seated hip abduction with yellow band 2x10 Seated marches 2x10  Seated knee extension 2x10  Seated isometric adduction ball squeeze  ASSESSMENT:  CLINICAL IMPRESSION: Th patient came in fatigued today but felt better following his exercises. We updated his HEP for upper body exercises. He reports pain in his elbow.  He was advised if his shower makes him significantly tired he should maybe think about doing it the night before. We will continue to advance as tolerated.   OBJECTIVE IMPAIRMENTS: Abnormal gait, decreased activity tolerance, decreased balance, decreased endurance, and decreased mobility. Decreased Strength;   ACTIVITY LIMITATIONS: carrying, lifting, standing, squatting, and stairs walking on level ground, dressing,  PARTICIPATION LIMITATIONS: driving, community activity, occupation, and yard work  PERSONAL FACTORS: CHF, CKD, DMII, Cardiac Arrest 05/22/2016 ; retinopathy, B12 are also affecting patient's functional outcome.   REHAB POTENTIAL:  Good  CLINICAL DECISION MAKING: Unstable declining general mobility   EVALUATION COMPLEXITY: High    GOALS: Goals reviewed with patient? Yes  SHORT TERM GOALS: Target date:12/10/2022   Patient will be able to ambulate 75 ft until they need a break. Baseline: Goal status: INITIAL  2.  Patient will perform 5x sit to stand test 5x  Baseline:  Goal status: INITIAL  3.  Patient will be able to stand for greater than 50 seconds without UE assist.  Baseline:  Goal status: INITIAL  4.  Patient will increase gross bilateral LE strength by 5 lbs  Baseline:  Goal status: INITIAL     LONG TERM GOALS: Target date: 11/26/2022  Patient will increase overall strength by 10 lbs in order to improve ability to transfer  Baseline:  Goal status: INITIAL  2.  Patient will be able to walk community distances with increased feeling of safety and balance  Baseline:  Goal status: INITIAL  3.  Patient will have comprehensive HEP that will improve his ability to perform ADL's and IADL's  Goal status: INITIAL      PLAN:  PT FREQUENCY: 1-2x/week  PT DURATION: 12 weeks   PLANNED INTERVENTIONS: Therapeutic exercises, Therapeutic activity, Neuromuscular re-education, Balance training, Gait training, Patient/Family education, Self Care, Joint mobilization, Stair training, DME instructions, Aquatic Therapy, Electrical stimulation, Cryotherapy, Moist heat, Manual therapy, and Re-evaluation  PLAN FOR NEXT SESSION: Based on how the patient presents we will start off by doing his HEP. We will modify them based on how he feels and increase resistance as needed. If patient tolerated HEP well we can add quad sets along with SAQ. If patient comes back feeling better we can do a few minutes on the Nu-step to get his heart rate up. We can also start working on his balance, eyes closed and opening standing balance.    Carney Living, PT 10/30/2022, 8:36 AM

## 2022-11-06 ENCOUNTER — Ambulatory Visit (HOSPITAL_BASED_OUTPATIENT_CLINIC_OR_DEPARTMENT_OTHER): Payer: PPO | Admitting: Physical Therapy

## 2022-11-06 ENCOUNTER — Encounter (HOSPITAL_BASED_OUTPATIENT_CLINIC_OR_DEPARTMENT_OTHER): Payer: Self-pay | Admitting: Physical Therapy

## 2022-11-06 DIAGNOSIS — R262 Difficulty in walking, not elsewhere classified: Secondary | ICD-10-CM

## 2022-11-06 DIAGNOSIS — M6281 Muscle weakness (generalized): Secondary | ICD-10-CM | POA: Diagnosis not present

## 2022-11-06 DIAGNOSIS — R2689 Other abnormalities of gait and mobility: Secondary | ICD-10-CM

## 2022-11-06 NOTE — Therapy (Signed)
OUTPATIENT PHYSICAL THERAPY LOWER EXTREMITY EVALUATION   Patient Name: Jeffrey [name] MRN: [id number] DOB:28-Nov-1955, 67 y.o., male Today's Date: 10/30/2022  END OF SESSION:  PT End of Session - 10/29/22 1545     Visit Number 1    Number of Visits 24    Date for PT Re-Evaluation 01/21/23    Authorization Type HTA    PT Start Time 1145    PT Stop Time 1229    PT Time Calculation (min) 44 min    Activity Tolerance Patient tolerated treatment well    Behavior During Therapy [facility] for tasks assessed/performed             Past Medical History:  Diagnosis Date   Cardiac arrest ([place]) 05/22/2016   Cataract    CHF (congestive heart failure) ([name])    CKD (chronic kidney disease) stage 3, GFR 30-59 ml/min ([place]) 05/28/2021   Diabetes mellitus without complication ([place])    type 2   Diabetic retinopathy ([place])    Hyperlipidemia    Hypertension    Memory loss    mild   Myocardial infarct ([place]) 2105   Retinopathy    Both eyes   S/P primary angioplasty with coronary stent 05/22/2016   Vitamin B12 deficiency    Vitreous hemorrhage of left eye ([place])    proliferative diabetic retinopathy   Past Surgical History:  Procedure Laterality Date   ANGIOPLASTY     2015   COLONOSCOPY     multiple   EYE SURGERY     PARS PLANA VITRECTOMY Left 03/15/2020   Procedure: PARS PLANA VITRECTOMY WITH 25 GAUGE;  Surgeon: [name], MD;  Location: [place];  Service: Ophthalmology;  Laterality: Left;   PHOTOCOAGULATION WITH LASER Left 03/15/2020   Procedure: PHOTOCOAGULATION WITH LASER; INTRAVITREAL INJECTION OF AVASTIN;  Surgeon: [name], MD;  Location: [place];  Service: Ophthalmology;  Laterality: Left;   REFRACTIVE SURGERY     UPPER GI ENDOSCOPY     several   VITRECTOMY     Patient Active Problem List   Diagnosis Date Noted   Cervical spondylosis 08/26/2022   CKD (chronic kidney disease) stage 3, GFR 30-59 ml/min ([name]) 05/28/2021   Adjustment disorder 01/22/2021   Chronic back pain  10/22/2020   Peripheral vertigo 05/15/2020   Dermatitis 01/04/2020   Unintentional weight loss with loose stools 05/19/2019   Low vitamin B12 level 03/29/2019   Heme positive stool 03/14/2019   Normocytic anemia 03/14/2019   Chronic diastolic heart failure ([place]) 02/08/2019   Diabetic retinopathy ([place]) 09/08/2018   Physical debility 05/24/2018   Varicose veins of both lower extremities 03/01/2018   Fatigue due to exertion 10/14/2017   GERD (gastroesophageal reflux disease) 08/25/2017   CAD in native artery 04/14/2017   Hyperlipidemia associated with type 2 diabetes mellitus ([place]) 04/14/2017   Diabetic peripheral neuropathy associated with type 2 diabetes mellitus ([place]) 08/17/2016   T2DM (type 2 diabetes mellitus) ([place]) 05/02/2016   Hypertension associated with diabetes ([place]) 05/02/2016    PCP: Dimas Chyle   REFERRING PROVIDER:  Ileene Rubens A MD   REFERRING DIAG: Diagnosis  R26.89 (ICD-10-CM) - Imbalance  R27.8 (ICD-10-CM) - Decreased dexterity    THERAPY DIAG:  Other abnormalities of gait and mobility  Difficulty in walking, not elsewhere classified  Muscle weakness (generalized)  Rationale for Evaluation and Treatment: Rehabilitation  ONSET DATE:   SUBJECTIVE:   SUBJECTIVE STATEMENT:  Patient was not feeling the best this morning. Was fatigue from his shower, got water in his  eye, which seemed to bother him a bit. Had to come in with a wheelchair, since he did not feel stable with a walker.  The patient has a long history of gait abnormality beginning back in 2022. He has a history of CAD and believes that his debility began following a covid infection. He had an episode of physical therapy in 2022 in which he improved but was unable to continue with his exercises following completion. In December odf 2022 he fell and suffered a compression fracture of L1. Per MD report it is healing well. He is having no significant low back pain at this time. His MD suspected  myelopathy 2nd to muscle wasting in his bilateral thenar area. All tests so far have come back negative. He will be seeing a neurologist in the coming weeks. He is using a walker for primary mobility at this time. He reports he only leaves the house to go to MD appointments. He has had no further falls since December.  PERTINENT HISTORY: CHF, CKD, DMII, Cardiac Arrest 05/22/2016 ; retinopathy, B12  PAIN:  Are you having pain? Yes: NPRS scale: 8/10 Pain location: right knee  Pain description: aching  Aggravating factors: standing and walking  Relieving factors: sitting down and resting  PRECAUTIONS: Fall  WEIGHT BEARING RESTRICTIONS: No  FALLS:  Has patient fallen in last 6 months? No  LIVING ENVIRONMENT: Lives with:  brother Lives in: House/apartment Stairs: No Has following equipment at home: Environmental consultant - 4 wheeled  OCCUPATION: Retired  PLOF: Requires assistive device for independence, Needs assistance with ADLs, Needs assistance with homemaking, Needs assistance with gait, and Needs assistance with transfers  PATIENT GOALS:   To walk better   NEXT MD VISIT:  April   OBJECTIVE:   DIAGNOSTIC FINDINGS:   PATIENT SURVEYS:     COGNITION: Overall cognitive status: Within functional limits for tasks assessed     SENSATION: Bilateral hand numbness    POSTURE: forward head and flexed trunk       UE ROM and strength WNL  LOWER EXTREMITY MMT:  MMT Right eval Left eval  Hip flexion 17 18  Hip extension    Hip abduction 12.9 9.9  Hip adduction    Hip internal rotation    Hip external rotation    Knee flexion    Knee extension 18.2 13.2  Ankle dorsiflexion 19.3 16.8  Ankle plantarflexion    Ankle inversion    Ankle eversion     (Blank rows = not tested)    FUNCTIONAL TESTS:  5 times sit to stand: 2 in 20 seconds  Pt was able to stand for 36 sec with assistance close contact guard. Fear of falling again.  GAIT: Distance walked: 50ft Assistive device  utilized: Environmental consultant - 4 wheeled As he walked he had difficulty flexing his lower extremity, especially in the last couple of feet. Comments: Has reduced gait velocity Balance: Minimal assistance normal base Narrow base,eyes closed: contact guard    TODAY'S TREATMENT:  DATE:   3/28 Manual: reviewed trigger point release to quad and hamstring. Reviewed self release to that area.   Hamstring string stretch 2x20 sec hold  Calf stretch 2x20 sec hold   Nu-step: 3 2 min intervals monitoring his symptoms    Bilateral er 2x15 yellow  Bilateral horizontal abduction 2x15 yellow  Bilateral flexion  2x15yellow band   3/25 Nu step did 3x 2 min interval  Seated bicep curl 3x10 1lb Seated chest press 3x10 1lb  Seated yellow band hip abduction 4x10 Seated knee extension 2x10     3/20 Eval  Seated hip abduction with yellow band 2x10 Seated marches 2x10  Seated knee extension 2x10  Seated isometric adduction ball squeeze  PATIENT EDUCATION:  Education details: Review HEP/POC  Person educated: Patient and brother Education method: Explanation, Media planner, Corporate treasurer cues, Verbal cues, and Handouts Education comprehension: verbalized understanding, returned demonstration, verbal cues required, tactile cues required, and needs further education  HOME EXERCISE PROGRAM: Access Code: [email or web address] URL: [email or web address] Date: 11/06/2022 Prepared by: Carolyne Littles   Seated hip abduction with yellow band 2x10 Seated marches 2x10  Seated knee extension 2x10  Seated isometric adduction ball squeeze 2x10  ASSESSMENT:  CLINICAL IMPRESSION: The patient had a significant increase in right knee pain coming in today. He had trigger points in his quad and hamstring. He had no significant improvement in pain with stretches but he was advised to continue them at  home. He as still able to do his endurance training. He was given an UE series to work on at home. He was advised not to do knee extension on the right side. Therapy will continue to progress as tolerated.  OBJECTIVE IMPAIRMENTS: Abnormal gait, decreased activity tolerance, decreased balance, decreased endurance, and decreased mobility. Decreased Strength;   ACTIVITY LIMITATIONS: carrying, lifting, standing, squatting, and stairs walking on level ground, dressing,  PARTICIPATION LIMITATIONS: driving, community activity, occupation, and yard work  PERSONAL FACTORS: CHF, CKD, DMII, Cardiac Arrest 05/22/2016 ; retinopathy, B12 are also affecting patient's functional outcome.   REHAB POTENTIAL: Good  CLINICAL DECISION MAKING: Unstable declining general mobility   EVALUATION COMPLEXITY: High    GOALS: Goals reviewed with patient? Yes  SHORT TERM GOALS: Target date:12/10/2022   Patient will be able to ambulate 75 ft until they need a break. Baseline: Goal status: INITIAL  2.  Patient will perform 5x sit to stand test 5x  Baseline:  Goal status: INITIAL  3.  Patient will be able to stand for greater than 50 seconds without UE assist.  Baseline:  Goal status: INITIAL  4.  Patient will increase gross bilateral LE strength by 5 lbs  Baseline:  Goal status: INITIAL     LONG TERM GOALS: Target date: 11/26/2022  Patient will increase overall strength by 10 lbs in order to improve ability to transfer  Baseline:  Goal status: INITIAL  2.  Patient will be able to walk community distances with increased feeling of safety and balance  Baseline:  Goal status: INITIAL  3.  Patient will have comprehensive HEP that will improve his ability to perform ADL's and IADL's  Goal status: INITIAL      PLAN:  PT FREQUENCY: 1-2x/week  PT DURATION: 12 weeks   PLANNED INTERVENTIONS: Therapeutic exercises, Therapeutic activity, Neuromuscular re-education, Balance training, Gait training,  Patient/Family education, Self Care, Joint mobilization, Stair training, DME instructions, Aquatic Therapy, Electrical stimulation, Cryotherapy, Moist heat, Manual therapy, and Re-evaluation  PLAN FOR NEXT SESSION: Based on how the patient presents we will  start off by doing his HEP. We will modify them based on how he feels and increase resistance as needed. If patient tolerated HEP well we can add quad sets along with SAQ. If patient comes back feeling better we can do a few minutes on the Nu-step to get his heart rate up. We can also start working on his balance, eyes closed and opening standing balance.    Carney Living, PT 10/30/2022, 8:36 AM

## 2022-11-08 ENCOUNTER — Other Ambulatory Visit: Payer: Self-pay | Admitting: Family Medicine

## 2022-11-09 ENCOUNTER — Other Ambulatory Visit: Payer: Self-pay | Admitting: Family Medicine

## 2022-11-11 ENCOUNTER — Ambulatory Visit (HOSPITAL_BASED_OUTPATIENT_CLINIC_OR_DEPARTMENT_OTHER): Payer: PPO | Admitting: Physical Therapy

## 2022-11-12 ENCOUNTER — Ambulatory Visit (INDEPENDENT_AMBULATORY_CARE_PROVIDER_SITE_OTHER): Payer: PPO | Admitting: Orthopedic Surgery

## 2022-11-12 ENCOUNTER — Other Ambulatory Visit (INDEPENDENT_AMBULATORY_CARE_PROVIDER_SITE_OTHER): Payer: PPO

## 2022-11-12 DIAGNOSIS — M1711 Unilateral primary osteoarthritis, right knee: Secondary | ICD-10-CM

## 2022-11-12 DIAGNOSIS — M25561 Pain in right knee: Secondary | ICD-10-CM | POA: Diagnosis not present

## 2022-11-12 MED ORDER — GABAPENTIN 300 MG PO CAPS: 300.0000 mg | ORAL_CAPSULE | Freq: Three times a day (TID) | ORAL | 0 refills | Status: DC

## 2022-11-12 NOTE — Progress Notes (Signed)
Orthopedic Surgery Progress Note   Assessment: Patient is a 67 y.o. male with right knee pain consistent with OA. Also reporting right burning, stabbing type pain that started around the time of the knee pain on the medial aspect of the great toe    Plan: -No acute operative intervention -Discussed symptomatic management with arthritis strength Tylenol.  Informed him that he could take up to 1000 mg 3 times per day -If pain is especially severe, can use ibuprofen in addition to the Tylenol -Prescribed gabapentin for his burning toe pain -Discussed injection today in the office which she wanted to proceed with -Follow-up on an as-needed basis   Right knee injection note After discussing the risks, benefits, and alternatives of right knee intra-articular steroid injection, patient elected to proceed.  The patient was placed in the seated position.  The inferior lateral aspect of the knee was prepped with alcohol based prep.  A 25-gauge needle was used to inject 1 cc of lidocaine, 1 cc of bupivacaine, 1 cc of Depo-Medrol into the anterior lateral soft spot under standard sterile technique.  The needle was withdrawn.  Band-Aid was applied.  Patient tolerated procedure well.  ___________________________________________________________________________  Subjective: Patient has had about a week of right knee pain.  He has a history of a meniscus tear on the right side.  He never underwent any surgery for the meniscus tear but was told he needed meniscectomy.  This was many years ago.  There is no trauma or injury that brought on his recent knee pain.  He states that it is severe at times and has kept him up at night over the past couple nights.  It is better today.  Around the time that the knee pain developed, he has also noticed burning and stabbing type pain on the medial aspect of his right great toe.  It goes from the MTP joint to the distal phalanx along the medial aspect.  No pain in the other  toes or the remainder of the foot.  There is no injury or trauma to the foot.  Denies paresthesia numbness.   Physical Exam:  General: no acute distress, appears stated age Neurologic: alert, answering questions appropriately, following commands Respiratory: unlabored breathing on room air, symmetric chest rise Psychiatric: appropriate affect, normal cadence to speech  MSK:   -Right lower extremity  No joint line tenderness, no palpable effusion at the knee, no tenderness palpation over the foot  Knee stable to varus valgus stress, negative Lachman, negative posterior drawer EHL/TA/GSC intact Sensation intact to light touch in sural, saphenous, tibial, deep peroneal, and superficial peroneal nerve distributions Foot warm and well perfused  XR of the right knee taken 11/12/2022 was independently reviewed and interpreted, showing medial joint space narrowing and small osteophyte formation over the superior aspect of the patella.  No fracture or dislocation.   Patient name: Jeffrey [name] Patient MRN: [id number] Date: 11/12/22

## 2022-11-14 ENCOUNTER — Encounter (HOSPITAL_BASED_OUTPATIENT_CLINIC_OR_DEPARTMENT_OTHER): Payer: PPO | Admitting: Physical Therapy

## 2022-11-14 ENCOUNTER — Telehealth: Payer: Self-pay | Admitting: Orthopedic Surgery

## 2022-11-14 NOTE — Telephone Encounter (Signed)
I called and advised patient of the message from Dr. Christell Constant. He states that he does not want to take the Lyrica due to the way Gabapentin has effected him. Dr. Christell Constant advised that he should wear his compression socks and elevate his legs. And ley [place] know if this doesn't improve. He asked about his elbow pain- I asked Dr. Christell Constant about this, he advised that patient could try Advil, Volteran Gel or we could call in a medrol dose pak. He states that he would like to try the Volteran Gel first.   His concern with MyChart- I gave him the [facility] IT number, he states that he called them and they advised that our office had to approve him to send messages thru MyChart. I advised patient and his brother that we have never done anything like that for any of our patients.  Advised that once you have had an official OV with [place] you then have the ability to message [place].  ---They called me back a few minutes later and states that they now have the access to message [place] and that they also have the ability to message his Neurologist that he sees in May 2024.--- I did advise that this sounds like IT was able to figure out what is going on and got it corrected for them.

## 2022-11-14 NOTE — Telephone Encounter (Signed)
Patient states he is not able to take the Gabapentin and states he is experiencing some swelling and he wants to speak to Dr. Christell Constant or his technician.patient also is upset that Dr. Christell Constant in not on his Mychart.[name] Kitchen

## 2022-11-14 NOTE — Telephone Encounter (Signed)
Patient is upset and wants to speak to a manager ASAP. He is Mad that Dr. Christell Constant, is not showing that he can contact Dr. Christell Constant on [place]. He wants to speak to a Production designer, theatre/television/film.

## 2022-11-14 NOTE — Telephone Encounter (Signed)
I called, patient says now situation is resolved and Dr Christell Constant is on MyChart as his provider, no further needed.

## 2022-11-17 ENCOUNTER — Ambulatory Visit (HOSPITAL_BASED_OUTPATIENT_CLINIC_OR_DEPARTMENT_OTHER): Payer: PPO | Attending: Family Medicine | Admitting: Physical Therapy

## 2022-11-17 ENCOUNTER — Encounter (HOSPITAL_BASED_OUTPATIENT_CLINIC_OR_DEPARTMENT_OTHER): Payer: Self-pay | Admitting: Physical Therapy

## 2022-11-17 DIAGNOSIS — M6281 Muscle weakness (generalized): Secondary | ICD-10-CM

## 2022-11-17 DIAGNOSIS — R2689 Other abnormalities of gait and mobility: Secondary | ICD-10-CM

## 2022-11-17 DIAGNOSIS — R262 Difficulty in walking, not elsewhere classified: Secondary | ICD-10-CM

## 2022-11-17 NOTE — Therapy (Signed)
OUTPATIENT PHYSICAL THERAPY LOWER EXTREMITY EVALUATION   Patient Name: Jeffrey HaneMichael Wassink MRN: [id number] DOB:08/24/1955, 67 y.o., male Today's Date: 11/17/2022  END OF SESSION:  PT End of Session - 11/17/22 1413     Visit Number 4    Number of Visits 24    Date for PT Re-Evaluation 01/21/23    Authorization Type HTA    PT Start Time 1154    PT Stop Time 1220   patient too fatigued to continue   PT Time Calculation (min) 26 min    Activity Tolerance Patient tolerated treatment well    Behavior [facility] for tasks assessed/performed             Past Medical History:  Diagnosis Date   Cardiac arrest 05/22/2016   Cataract    CHF (congestive heart failure)    CKD (chronic kidney disease) stage 3, GFR 30-59 ml/min 05/28/2021   Diabetes mellitus without complication    type 2   Diabetic retinopathy    Hyperlipidemia    Hypertension    Memory loss    mild   Myocardial infarct 2105   Retinopathy    Both eyes   S/P primary angioplasty with coronary stent 05/22/2016   Vitamin B12 deficiency    Vitreous hemorrhage of left eye    proliferative diabetic retinopathy   Past Surgical History:  Procedure Laterality Date   ANGIOPLASTY     2015   COLONOSCOPY     multiple   EYE SURGERY     PARS PLANA VITRECTOMY Left 03/15/2020   Procedure: PARS PLANA VITRECTOMY WITH 25 GAUGE;  Surgeon: Stephannie LiSanders, Jason, MD;  [facility];  Service: Ophthalmology;  Laterality: Left;   PHOTOCOAGULATION WITH LASER Left 03/15/2020   Procedure: PHOTOCOAGULATION WITH LASER; INTRAVITREAL INJECTION OF AVASTIN;  Surgeon: Stephannie LiSanders, Jason, MD;  [facility];  Service: Ophthalmology;  Laterality: Left;   REFRACTIVE SURGERY     UPPER GI ENDOSCOPY     several   VITRECTOMY     Patient Active Problem List   Diagnosis Date Noted   Cervical spondylosis 08/26/2022   CKD (chronic kidney disease) stage 3, GFR 30-59 ml/min 05/28/2021   Adjustment disorder 01/22/2021   Chronic back pain 10/22/2020    Peripheral vertigo 05/15/2020   Dermatitis 01/04/2020   Unintentional weight loss with loose stools 05/19/2019   Low vitamin B12 level 03/29/2019   Heme positive stool 03/14/2019   Normocytic anemia 03/14/2019   Chronic diastolic heart failure 02/08/2019   Diabetic retinopathy 09/08/2018   Physical debility 05/24/2018   Varicose veins of both lower extremities 03/01/2018   Fatigue due to exertion 10/14/2017   GERD (gastroesophageal reflux disease) 08/25/2017   CAD in native artery 04/14/2017   Hyperlipidemia associated with type 2 diabetes mellitus 04/14/2017   Diabetic peripheral neuropathy associated with type 2 diabetes mellitus 08/17/2016   T2DM (type 2 diabetes mellitus) 05/02/2016   Hypertension associated with diabetes 05/02/2016    PCP: Jacquiline Doealeb Parker   REFERRING PROVIDER:  Willia CrazeMichael Moore A MD   REFERRING DIAG: Diagnosis  R26.89 (ICD-10-CM) - Imbalance  R27.8 (ICD-10-CM) - Decreased dexterity    THERAPY DIAG:  Other abnormalities of gait and mobility  Difficulty in walking, not elsewhere classified  Muscle weakness (generalized)  Rationale for Evaluation and Treatment: Rehabilitation  ONSET DATE:   SUBJECTIVE:   SUBJECTIVE STATEMENT:  The patient said he had difficulty walking today. He had to use his transport chair to get in. He started by trying to use his  walker. He had an injection in his knee on 4/3 which helped.    The patient has a long history of gait abnormality beginning back in 2022. He has a history of CAD and believes that his debility began following a covid infection. He had an episode of physical therapy in 2022 in which he improved but was unable to continue with his exercises following completion. In December odf 2022 he fell and suffered a compression fracture of L1. Per MD report it is healing well. He is having no significant low back pain at this time. His MD suspected myelopathy 2nd to muscle wasting in his bilateral thenar area. All tests  so far have come back negative. He will be seeing a neurologist in the coming weeks. He is using a walker for primary mobility at this time. He reports he only leaves the house to go to MD appointments. He has had no further falls since December.  PERTINENT HISTORY: CHF, CKD, DMII, Cardiac Arrest 05/22/2016 ; retinopathy, B12  PAIN:  Are you having pain? Yes: NPRS scale: 8/10 Pain location: right knee  Pain description: aching  Aggravating factors: standing and walking  Relieving factors: sitting down and resting  PRECAUTIONS: Fall  WEIGHT BEARING RESTRICTIONS: No  FALLS:  Has patient fallen in last 6 months? No  LIVING ENVIRONMENT: Lives with:  brother Lives in: House/apartment Stairs: No Has following equipment at home: Environmental consultant - 4 wheeled  OCCUPATION: Retired  PLOF: Requires assistive device for independence, Needs assistance with ADLs, Needs assistance with homemaking, Needs assistance with gait, and Needs assistance with transfers  PATIENT GOALS:   To walk better   NEXT MD VISIT:  April   OBJECTIVE:   DIAGNOSTIC FINDINGS:   PATIENT SURVEYS:     COGNITION: Overall cognitive status: Within functional limits for tasks assessed     SENSATION: Bilateral hand numbness    POSTURE: forward head and flexed trunk       UE ROM and strength WNL  LOWER EXTREMITY MMT:  MMT Right eval Left eval  Hip flexion 17 18  Hip extension    Hip abduction 12.9 9.9  Hip adduction    Hip internal rotation    Hip external rotation    Knee flexion    Knee extension 18.2 13.2  Ankle dorsiflexion 19.3 16.8  Ankle plantarflexion    Ankle inversion    Ankle eversion     (Blank rows = not tested)    FUNCTIONAL TESTS:  5 times sit to stand: 2 in 20 seconds  Pt was able to stand for 36 sec with assistance close contact guard. Fear of falling again.  GAIT: Distance walked: 70ft Assistive device utilized: Environmental consultant - 4 wheeled As he walked he had difficulty flexing his  lower extremity, especially in the last couple of feet. Comments: Has reduced gait velocity Balance: Minimal assistance normal base Narrow base,eyes closed: contact guard    TODAY'S TREATMENT:  DATE:   4/8 Nu-step L1 x31mins Supine:  Quad sets 2x10 bilaterally  Heel slide 2x10 bilaterally  Hip abduction 3x10 yellow   At this time he reported feeling fatigued. We sat him up. He reported pain in his neck and right shoulder. He requested we end the session. He was brought to his brother in his transport chair.     3/28 Manual: reviewed trigger point release to quad and hamstring. Reviewed self release to that area.   Hamstring string stretch 2x20 sec hold  Calf stretch 2x20 sec hold   Nu-step: 3 2 min intervals monitoring his symptoms    Bilateral er 2x15 yellow  Bilateral horizontal abduction 2x15 yellow  Bilateral flexion  2x15yellow band   3/25 Nu step did 3x 2 min interval  Seated bicep curl 3x10 1lb Seated chest press 3x10 1lb  Seated yellow band hip abduction 4x10 Seated knee extension 2x10     3/20 Eval  Seated hip abduction with yellow band 2x10 Seated marches 2x10  Seated knee extension 2x10  Seated isometric adduction ball squeeze  PATIENT EDUCATION:  Education details: Review HEP/POC  Person educated: Patient and brother Education method: Explanation, Facilities manager, Actor cues, Verbal cues, and Handouts Education comprehension: verbalized understanding, returned demonstration, verbal cues required, tactile cues required, and needs further education  HOME EXERCISE PROGRAM: Access Code: [email or web address] URL: [email or web address] Date: 11/06/2022 Prepared by: Lorayne Bender   Seated hip abduction with yellow band 2x10 Seated marches 2x10  Seated knee extension 2x10  Seated isometric adduction ball squeeze 2x10   ASSESSMENT:  CLINICAL IMPRESSION: The patient was limited today by fatigue. He hd difficulty getting in today 2nd to fatigue. We attempted supine exercises so we could work on base knee and hip strengthening. After a while he had pain in his neck and shoulder lying the way her was. Following these activity's he requested ending the treatment 2nd to fatigue.   OBJECTIVE IMPAIRMENTS: Abnormal gait, decreased activity tolerance, decreased balance, decreased endurance, and decreased mobility. Decreased Strength;   ACTIVITY LIMITATIONS: carrying, lifting, standing, squatting, and stairs walking on level ground, dressing,  PARTICIPATION LIMITATIONS: driving, community activity, occupation, and yard work  PERSONAL FACTORS: CHF, CKD, DMII, Cardiac Arrest 05/22/2016 ; retinopathy, B12 are also affecting patient's functional outcome.   REHAB POTENTIAL: Good  CLINICAL DECISION MAKING: Unstable declining general mobility   EVALUATION COMPLEXITY: High    GOALS: Goals reviewed with patient? Yes  SHORT TERM GOALS: Target date:12/10/2022   Patient will be able to ambulate 75 ft until they need a break. Baseline: Goal status: INITIAL  2.  Patient will perform 5x sit to stand test 5x  Baseline:  Goal status: INITIAL  3.  Patient will be able to stand for greater than 50 seconds without UE assist.  Baseline:  Goal status: INITIAL  4.  Patient will increase gross bilateral LE strength by 5 lbs  Baseline:  Goal status: INITIAL     LONG TERM GOALS: Target date: 11/26/2022  Patient will increase overall strength by 10 lbs in order to improve ability to transfer  Baseline:  Goal status: INITIAL  2.  Patient will be able to walk community distances with increased feeling of safety and balance  Baseline:  Goal status: INITIAL  3.  Patient will have comprehensive HEP that will improve his ability to perform ADL's and IADL's  Goal status: INITIAL      PLAN:  PT FREQUENCY:  1-2x/week  PT DURATION: 12 weeks   PLANNED INTERVENTIONS: Therapeutic exercises, Therapeutic  activity, Neuromuscular re-education, Balance training, Gait training, Patient/Family education, Self Care, Joint mobilization, Stair training, DME instructions, Aquatic Therapy, Electrical stimulation, Cryotherapy, Moist heat, Manual therapy, and Re-evaluation  PLAN FOR NEXT SESSION: Based on how the patient presents we will start off by doing his HEP. We will modify them based on how he feels and increase resistance as needed. If patient tolerated HEP well we can add quad sets along with SAQ. If patient comes back feeling better we can do a few minutes on the Nu-step to get his heart rate up. We can also start working on his balance, eyes closed and opening standing balance.    Dessie Coma, PT 11/17/2022, 3:35 PM   Cristal Ford SPT   During this treatment session, the therapist was present, participating in and directing the treatment.   I have reviewed and concur with this student's documentation.

## 2022-11-18 ENCOUNTER — Other Ambulatory Visit: Payer: Self-pay | Admitting: *Deleted

## 2022-11-18 ENCOUNTER — Other Ambulatory Visit (INDEPENDENT_AMBULATORY_CARE_PROVIDER_SITE_OTHER): Payer: PPO

## 2022-11-18 DIAGNOSIS — Z794 Long term (current) use of insulin: Secondary | ICD-10-CM

## 2022-11-18 DIAGNOSIS — E1142 Type 2 diabetes mellitus with diabetic polyneuropathy: Secondary | ICD-10-CM

## 2022-11-18 LAB — COMPREHENSIVE METABOLIC PANEL
ALT: 38 U/L (ref 0–53)
AST: 22 U/L (ref 0–37)
Albumin: 4.3 g/dL (ref 3.5–5.2)
Alkaline Phosphatase: 60 U/L (ref 39–117)
BUN: 61 mg/dL — ABNORMAL HIGH (ref 6–23)
CO2: 23 mEq/L (ref 19–32)
Calcium: 9.2 mg/dL (ref 8.4–10.5)
Chloride: 105 mEq/L (ref 96–112)
Creatinine, Ser: 2.47 mg/dL — ABNORMAL HIGH (ref 0.40–1.50)
GFR: 26.22 mL/min — ABNORMAL LOW (ref >60.00)
Glucose, Bld: 91 mg/dL (ref 70–99)
Potassium: 4.4 mEq/L (ref 3.5–5.1)
Sodium: 141 mEq/L (ref 135–145)
Total Bilirubin: 0.5 mg/dL (ref 0.2–1.2)
Total Protein: 6.9 g/dL (ref 6.0–8.3)

## 2022-11-18 LAB — LIPID PANEL
Cholesterol: 107 mg/dL (ref 0–200)
HDL: 30.8 mg/dL — ABNORMAL LOW (ref >39.00)
LDL Cholesterol: 48 mg/dL (ref 0–99)
NonHDL: 76.09
Total CHOL/HDL Ratio: 3
Triglycerides: 142 mg/dL (ref 0.0–149.0)
VLDL: 28.4 mg/dL (ref 0.0–40.0)

## 2022-11-18 LAB — CBC
HCT: 42.2 % (ref 39.0–52.0)
Hemoglobin: 14.5 g/dL (ref 13.0–17.0)
MCHC: 34.5 g/dL (ref 30.0–36.0)
MCV: 94.3 fl (ref 78.0–100.0)
Platelets: 390 10*3/uL (ref 150.0–400.0)
RBC: 4.48 Mil/uL (ref 4.22–5.81)
RDW: 13.4 % (ref 11.5–15.5)
WBC: 11 10*3/uL — ABNORMAL HIGH (ref 4.0–10.5)

## 2022-11-18 LAB — PSA: PSA: 3.19 ng/mL (ref 0.10–4.00)

## 2022-11-18 LAB — TSH: TSH: 2.52 u[IU]/mL (ref 0.35–5.50)

## 2022-11-18 LAB — HEMOGLOBIN A1C: Hgb A1c MFr Bld: 8.7 % — ABNORMAL HIGH (ref 4.6–6.5)

## 2022-11-20 ENCOUNTER — Encounter (HOSPITAL_BASED_OUTPATIENT_CLINIC_OR_DEPARTMENT_OTHER): Payer: PPO | Admitting: Physical Therapy

## 2022-11-20 NOTE — Progress Notes (Signed)
Please inform patient of the following:  His A1c is up a little bit to 8.7.  We did need to work on getting this lower.  We can discuss more at his upcoming office visit.  His kidney function is stable compared to previous values.

## 2022-11-25 ENCOUNTER — Ambulatory Visit: Payer: PPO | Admitting: Family Medicine

## 2022-11-26 ENCOUNTER — Telehealth: Payer: Self-pay | Admitting: Family Medicine

## 2022-11-26 ENCOUNTER — Encounter (HOSPITAL_BASED_OUTPATIENT_CLINIC_OR_DEPARTMENT_OTHER): Payer: PPO | Admitting: Physical Therapy

## 2022-11-26 NOTE — Telephone Encounter (Signed)
Copied from CRM [id number]. Topic: Medicare AWV >> Nov 26, 2022 10:40 AM Gwenith Spitz wrote: Reason for CRM: Called patient to schedule Medicare Annual Wellness Visit (AWV). Left message for patient to call back and schedule Medicare Annual Wellness Visit (AWV).  Last date of AWV: N/A  Please schedule an appointment at any time with Inetta Fermo, [facility]. Please schedule AWVS with Inetta Fermo, [facility]..  If any questions, please contact me at [phone].  Thank you ,  Gabriel Cirri [facility] AWV TEAM Direct Dial [phone]

## 2022-11-27 ENCOUNTER — Other Ambulatory Visit: Payer: Self-pay | Admitting: Family Medicine

## 2022-11-28 ENCOUNTER — Encounter (HOSPITAL_BASED_OUTPATIENT_CLINIC_OR_DEPARTMENT_OTHER): Payer: PPO | Admitting: Physical Therapy

## 2022-12-01 ENCOUNTER — Encounter (HOSPITAL_BASED_OUTPATIENT_CLINIC_OR_DEPARTMENT_OTHER): Payer: PPO | Admitting: Physical Therapy

## 2022-12-04 ENCOUNTER — Encounter (HOSPITAL_BASED_OUTPATIENT_CLINIC_OR_DEPARTMENT_OTHER): Payer: PPO | Admitting: Physical Therapy

## 2022-12-08 ENCOUNTER — Encounter (HOSPITAL_BASED_OUTPATIENT_CLINIC_OR_DEPARTMENT_OTHER): Payer: PPO | Admitting: Physical Therapy

## 2022-12-11 ENCOUNTER — Encounter (HOSPITAL_BASED_OUTPATIENT_CLINIC_OR_DEPARTMENT_OTHER): Payer: PPO | Admitting: Physical Therapy

## 2022-12-16 ENCOUNTER — Encounter (HOSPITAL_BASED_OUTPATIENT_CLINIC_OR_DEPARTMENT_OTHER): Payer: PPO | Admitting: Physical Therapy

## 2022-12-18 ENCOUNTER — Ambulatory Visit (INDEPENDENT_AMBULATORY_CARE_PROVIDER_SITE_OTHER): Payer: PPO | Admitting: Family Medicine

## 2022-12-18 ENCOUNTER — Encounter: Payer: Self-pay | Admitting: Family Medicine

## 2022-12-18 ENCOUNTER — Encounter (HOSPITAL_BASED_OUTPATIENT_CLINIC_OR_DEPARTMENT_OTHER): Payer: PPO | Admitting: Physical Therapy

## 2022-12-18 VITALS — BP 118/67 | HR 75 | Temp 97.5°F | Ht 67.0 in | Wt 169.6 lb

## 2022-12-18 DIAGNOSIS — Z794 Long term (current) use of insulin: Secondary | ICD-10-CM

## 2022-12-18 DIAGNOSIS — R5381 Other malaise: Secondary | ICD-10-CM

## 2022-12-18 DIAGNOSIS — E1159 Type 2 diabetes mellitus with other circulatory complications: Secondary | ICD-10-CM

## 2022-12-18 DIAGNOSIS — E1142 Type 2 diabetes mellitus with diabetic polyneuropathy: Secondary | ICD-10-CM

## 2022-12-18 DIAGNOSIS — I152 Hypertension secondary to endocrine disorders: Secondary | ICD-10-CM | POA: Diagnosis not present

## 2022-12-18 LAB — POCT GLYCOSYLATED HEMOGLOBIN (HGB A1C): Hemoglobin A1C: 7.9 % — AB (ref 4.0–5.6)

## 2022-12-18 NOTE — Assessment & Plan Note (Signed)
A1c improved to 7.9 from 8.7 most recently.  He is doing a great job with diet and exercise.  He is working well with physical therapy and has significant improvement in strength since her last visit as well.  He will continue with Lantus 38 units daily.  Does have NovoLog to use as needed for elevated sugars however has not had to use this for the last few months.  Will recheck A1c again in 3 months.

## 2022-12-18 NOTE — Assessment & Plan Note (Signed)
Blood pressure at goal on Coreg 3.125 mg twice daily and spironolactone 25 mg daily.

## 2022-12-18 NOTE — Assessment & Plan Note (Signed)
Patient has had significant improvement in physical conditioning since her last visit.  He is now working with a Systems analyst and this is going very well.  He will continue seeing them.  Anticipate he will have continued improvement in functional status over the next several weeks to months.

## 2022-12-18 NOTE — Patient Instructions (Addendum)
It was very nice to see you today!  I am glad you are doing well.   No changes today.  Return in about 3 months (around 03/20/2023).   Take care, Dr [name]  PLEASE NOTE:  If you had any lab tests, please let us know if you have not heard back within a few days. You may see your results on mychart before we have a chance to review them but we will give you a call once they are reviewed by [place].   If we ordered any referrals today, please let us know if you have not heard from their office within the next week.   If you had any urgent prescriptions sent in today, please check with the pharmacy within an hour of our visit to make sure the prescription was transmitted appropriately.   Please try these tips to maintain a healthy lifestyle:  Eat at least 3 REAL meals and 1-2 snacks per day.  Aim for no more than 5 hours between eating.  If you eat breakfast, please do so within one hour of getting up.   Each meal should contain half fruits/vegetables, one quarter protein, and one quarter carbs (no bigger than a computer mouse)  Cut down on sweet beverages. This includes juice, soda, and sweet tea.   Drink at least 1 glass of water with each meal and aim for at least 8 glasses per day  Exercise at least 150 minutes every week.

## 2022-12-18 NOTE — Progress Notes (Signed)
   Jeffrey [name] is a 67 y.o. male who presents today for an office visit.  Assessment/Plan:  New/Acute Problems:   Chronic Problems Addressed Today: [name] A1c improved to 7.9 from 8.7 most recently.  He is doing a great job with diet and exercise.  He is working well with physical therapy and has significant improvement in strength since her last visit as well.  He will continue with Lantus 38 units daily.  Does have NovoLog to use as needed for elevated sugars however has not had to use this for the last few months.  Will recheck A1c again in 3 months.  Hypertension associated with diabetes ([name]) Blood pressure at goal on Coreg 3.125 mg twice daily and spironolactone 25 mg daily.  Physical debility Patient has had significant improvement in physical conditioning since her last visit.  He is now working with a Systems analyst and this is going very well.  He will continue seeing them.  Anticipate he will have continued improvement in functional status over the next several weeks to months.     Subjective:  HPI:  See Assessment / plan for status of chronic conditions.   He has no acute concerns today.  Last time a few months ago.  Since our last visit he has started working with a Systems analyst about a month or so ago.  This has worked very well for him.  He has had significant improvement in strength and endurance.  He is able to walk much better now.  Still does occasionally use a walker for extra assistance though is able to walk independently without assistive devices for short distances.  Feels much better mentally as well.  He has been following with neurosurgery for the compression fractures in his back.  These have improved.  He is no longer taking gabapentin.  This caused some issues with his mood as well.        Objective:  Physical Exam: BP 118/67   Pulse 75   Temp (!) 97.5 F (36.4 C) (Temporal)   Ht 5\' 7"  (1.702 m)   Wt 169 lb  9.6 oz (76.9 kg)   SpO2 100%   BMI 26.56 kg/m   Gen: No acute distress, resting comfortably CV: Regular rate and rhythm with no murmurs appreciated Pulm: Normal work of breathing, clear to auscultation bilaterally with no crackles, wheezes, or rhonchi Neuro: Grossly normal, moves all extremities Psych: Normal affect and thought content  Time Spent: 50 minutes of total time was spent on the date of the encounter performing the following actions: chart review prior to seeing the patient including recent specialists visits, obtaining history, performing a medically necessary exam, counseling on the treatment plan, placing orders, and documenting in our EHR.        [name]. [name], MD 12/18/2022 11:33 AM

## 2022-12-19 ENCOUNTER — Encounter (HOSPITAL_BASED_OUTPATIENT_CLINIC_OR_DEPARTMENT_OTHER): Payer: PPO | Admitting: Physical Therapy

## 2022-12-24 ENCOUNTER — Ambulatory Visit: Payer: HMO | Admitting: Diagnostic Neuroimaging

## 2023-01-08 ENCOUNTER — Encounter: Payer: Self-pay | Admitting: Family Medicine

## 2023-01-09 NOTE — Telephone Encounter (Signed)
They can come in to discuss if they wish however it would be reasonable for [place] to place a referral to establish with nephrology.  Ok to place referral if they are agreeable.  [name]. [name], MD 01/09/2023 8:10 AM

## 2023-01-12 ENCOUNTER — Other Ambulatory Visit: Payer: Self-pay | Admitting: *Deleted

## 2023-01-12 DIAGNOSIS — N1832 Chronic kidney disease, stage 3b: Secondary | ICD-10-CM

## 2023-01-12 NOTE — Telephone Encounter (Signed)
Patient notified referral placed, Nephrology office will call with appointment information

## 2023-01-13 DIAGNOSIS — L738 Other specified follicular disorders: Secondary | ICD-10-CM | POA: Diagnosis not present

## 2023-01-13 DIAGNOSIS — L814 Other melanin hyperpigmentation: Secondary | ICD-10-CM | POA: Diagnosis not present

## 2023-01-13 DIAGNOSIS — D2262 Melanocytic nevi of left upper limb, including shoulder: Secondary | ICD-10-CM | POA: Diagnosis not present

## 2023-01-13 DIAGNOSIS — L570 Actinic keratosis: Secondary | ICD-10-CM | POA: Diagnosis not present

## 2023-01-13 DIAGNOSIS — L821 Other seborrheic keratosis: Secondary | ICD-10-CM | POA: Diagnosis not present

## 2023-01-13 DIAGNOSIS — D2239 Melanocytic nevi of other parts of face: Secondary | ICD-10-CM | POA: Diagnosis not present

## 2023-01-13 DIAGNOSIS — L820 Inflamed seborrheic keratosis: Secondary | ICD-10-CM | POA: Diagnosis not present

## 2023-01-13 DIAGNOSIS — D225 Melanocytic nevi of trunk: Secondary | ICD-10-CM | POA: Diagnosis not present

## 2023-01-16 ENCOUNTER — Ambulatory Visit (INDEPENDENT_AMBULATORY_CARE_PROVIDER_SITE_OTHER): Payer: PPO | Admitting: Podiatry

## 2023-01-16 DIAGNOSIS — B351 Tinea unguium: Secondary | ICD-10-CM

## 2023-01-16 DIAGNOSIS — Q666 Other congenital valgus deformities of feet: Secondary | ICD-10-CM | POA: Diagnosis not present

## 2023-01-16 NOTE — Progress Notes (Signed)
Subjective:  Patient ID: Jeffrey [name], male    DOB: 12-31-1955,  MRN: [id number]  Chief Complaint  Patient presents with   Blister    Right hallux lateral side great toe looks as if it was an old blister. It has some blood filled in the area. Patient stated that he feels that it happened during PT and also noticed it a week.    Nail Trim    Would like his nail trim     67 y.o. male presents with the above complaint.  Patient presents with bilateral hallux nail fungus.  Patient states been present for quite some time he would like to discuss laser treatment option does not want oral medication or any other option.  He also has secondary complaint of flatfoot deformity.  Patient would like to discuss left foot options.  He has worn many bracing but no customized insoles in a while.  He would like to go custom-made orthotic.   Review of Systems: Negative except as noted in the HPI. Denies N/V/F/Ch.  Past Medical History:  Diagnosis Date   Cardiac arrest ([name]) 05/22/2016   Cataract    CHF (congestive heart failure) ([name])    CKD (chronic kidney disease) stage 3, GFR 30-59 ml/min ([name]) 05/28/2021   Diabetes mellitus without complication ([name])    type 2   Diabetic retinopathy ([name])    Hyperlipidemia    Hypertension    Memory loss    mild   Myocardial infarct ([name]) 2105   Retinopathy    Both eyes   S/P primary angioplasty with coronary stent 05/22/2016   Vitamin B12 deficiency    Vitreous hemorrhage of left eye ([name])    proliferative diabetic retinopathy    Current Outpatient Medications:    acetaminophen (TYLENOL) 325 MG tablet, Take 325-650 mg by mouth every 6 (six) hours as needed for moderate pain., Disp: , Rfl:    aspirin EC 81 MG tablet, Take 81 mg by mouth daily., Disp: , Rfl:    atorvastatin (LIPITOR) 80 MG tablet, TAKE 1 TABLET BY MOUTH EVERY DAY, Disp: 90 tablet, Rfl: 1   blood glucose meter kit and supplies, Dispense based on patient and insurance preference. Use up to  four times daily as directed. (FOR ICD-10 E10.9, E11.9)., Disp: 1 each, Rfl: 0   Blood Glucose Monitoring Suppl (ONETOUCH VERIO IQ SYSTEM) w/Device KIT, 1 kit by Does not apply route 4 (four) times daily., Disp: 1 kit, Rfl: 0   carvedilol (COREG) 3.125 MG tablet, TAKE 1 TABLET BY MOUTH 2 TIMES DAILY WITH MEAL, Disp: 180 tablet, Rfl: 1   furosemide (LASIX) 40 MG tablet, TAKE 1 TABLET BY MOUTH EVERY DAY, Disp: 90 tablet, Rfl: 1   gentamicin (GARAMYCIN) 0.3 % ophthalmic solution, , Disp: , Rfl:    gentamicin-prednisoLONE 0.3-1 % ophthalmic drops, 1 drop 2 (two) times daily., Disp: , Rfl:    glucose blood (ONETOUCH VERIO) test strip, CHECK BLOOD SUGAR 4 TIMES A DAY, Disp: 200 strip, Rfl: 1   insulin aspart (NOVOLOG) 100 UNIT/ML injection, USE 3X DAILY AS NEEDED FOR HIGH BLOOD SUGARS. 3 UNITS FOR SUGAR >200, 6 UNITS >300, 9 UNITS 400+, Disp: 10 mL, Rfl: 5   insulin glargine (LANTUS SOLOSTAR) 100 UNIT/ML Solostar Pen, INJECT 34-50 UNITS INTO THE SKIN DAILY., Disp: 45 mL, Rfl: 1   Insulin Pen Needle (PEN NEEDLES) 33G X 4 MM MISC, 1 application by Does not apply route in the morning, at noon, in the evening, and at bedtime., Disp: 300  each, Rfl: prn   Insulin Syringe-Needle U-100 (B-D INS SYR HALF-UNIT .3CC/31G) 31G X 5/16" 0.3 ML MISC, Use 6 times a day, Disp: 200 each, Rfl: 11   ofloxacin (OCUFLOX) 0.3 % ophthalmic solution, Place 1 drop into the left eye 4 (four) times daily., Disp: , Rfl:    OneTouch Delica Lancets 33G MISC, Use to check blood sugar 4 times per day, Disp: 200 each, Rfl: 11   pantoprazole (PROTONIX) 40 MG tablet, TAKE 1 TABLET BY MOUTH EVERY DAY, Disp: 90 tablet, Rfl: 1   spironolactone (ALDACTONE) 25 MG tablet, TAKE 1 TABLET (25 MG TOTAL) BY MOUTH DAILY., Disp: 90 tablet, Rfl: 0   triamcinolone cream (KENALOG) 0.1 %, Apply 1 application topically 2 (two) times daily., Disp: 454 g, Rfl: 0   vitamin B-12 (CYANOCOBALAMIN) 1000 MCG tablet, Take 1,000 mcg by mouth daily., Disp: , Rfl:    Social History   Tobacco Use  Smoking Status Never  Smokeless Tobacco Never    Allergies  Allergen Reactions   Codeine Nausea And Vomiting   Farxiga [Dapagliflozin]     Constipation   Gabapentin     Mood changes, depression    Losartan    Penicillins Rash    Did it involve swelling of the face/tongue/throat, SOB, or low BP? no Did it involve sudden or severe rash/hives, skin peeling, or any reaction on the inside of your mouth or nose? yes Did you need to seek medical attention at a hospital or doctor's office? yes When did it last happen?    childhood   If all above answers are "NO", may proceed with cephalosporin use.    Objective:  There were no vitals filed for this visit. There is no height or weight on file to calculate BMI. Constitutional Well developed. Well nourished.  Vascular Dorsalis pedis pulses palpable bilaterally. Posterior tibial pulses palpable bilaterally. Capillary refill normal to all digits.  No cyanosis or clubbing noted. Pedal hair growth normal.  Neurologic Normal speech. Oriented to person, place, and time. Epicritic sensation to light touch grossly present bilaterally.  Dermatologic Nails thickened and negative dystrophic mycotic toenails x 2 bilateral hallux Skin within normal limits  Orthopedic: Normal joint ROM without pain or crepitus bilaterally. No visible deformities. No bony tenderness.   Radiographs: None Assessment:   1. Pes planovalgus   2. Nail fungus   3. Onychomycosis due to dermatophyte    Plan:  Patient was evaluated and treated and all questions answered.  Bilateral hallux onychomycosis -Educated the patient on the etiology of onychomycosis and various treatment options associated with improving the fungal load.  I explained to the patient that there is 3 treatment options available to treat the onychomycosis including topical, p.o., laser treatment.  Patient elected to undergo laser treatment.  Encourage patient to  let her take 6-7 treatment for improvement.  He states understanding.  Pes planovalgus -I explained to patient the etiology of pes planovalgus and relationship with Planter fasciitis and various treatment options were discussed.  Given patient foot structure in the setting of Planter fasciitis I believe patient will benefit from custom-made orthotics to help control the hindfoot motion support the arch of the foot and take the stress away from plantar fascial.  Patient agrees with the plan like to proceed with orthotics -Patient was casted for orthotics    No follow-ups on file.

## 2023-02-03 DIAGNOSIS — D631 Anemia in chronic kidney disease: Secondary | ICD-10-CM | POA: Diagnosis not present

## 2023-02-03 DIAGNOSIS — N184 Chronic kidney disease, stage 4 (severe): Secondary | ICD-10-CM | POA: Diagnosis not present

## 2023-02-03 DIAGNOSIS — I129 Hypertensive chronic kidney disease with stage 1 through stage 4 chronic kidney disease, or unspecified chronic kidney disease: Secondary | ICD-10-CM | POA: Diagnosis not present

## 2023-02-03 DIAGNOSIS — I5030 Unspecified diastolic (congestive) heart failure: Secondary | ICD-10-CM | POA: Diagnosis not present

## 2023-02-03 DIAGNOSIS — E1122 Type 2 diabetes mellitus with diabetic chronic kidney disease: Secondary | ICD-10-CM | POA: Diagnosis not present

## 2023-02-03 DIAGNOSIS — N189 Chronic kidney disease, unspecified: Secondary | ICD-10-CM | POA: Diagnosis not present

## 2023-02-03 LAB — CBC AND DIFFERENTIAL
HCT: 45 (ref 41–53)
Hemoglobin: 15.1 (ref 13.5–17.5)
Platelets: 424 10*3/uL — AB (ref 150–400)
WBC: 11.6

## 2023-02-03 LAB — IRON,TIBC AND FERRITIN PANEL
Ferritin: 97
Iron: 93
TIBC: 300
UIBC: 207

## 2023-02-03 LAB — COMPREHENSIVE METABOLIC PANEL
Albumin: 3.7 (ref 3.5–5.0)
Globulin: 3.3

## 2023-02-03 LAB — CBC: RBC: 4.73 (ref 3.87–5.11)

## 2023-02-03 LAB — HEPATITIS B SURFACE ANTIGEN: Hepatitis B Surface Ag: NEGATIVE

## 2023-02-03 LAB — VITAMIN D 25 HYDROXY (VIT D DEFICIENCY, FRACTURES): Vit D, 25-Hydroxy: 10.1

## 2023-02-06 LAB — LAB REPORT - SCANNED
Creatinine, POC: 87.6 mg/dL
EGFR: 25
HM HIV Screening: NEGATIVE
HM Hepatitis Screen: NEGATIVE

## 2023-02-10 ENCOUNTER — Other Ambulatory Visit: Payer: Self-pay | Admitting: Nephrology

## 2023-02-10 ENCOUNTER — Other Ambulatory Visit: Payer: Self-pay | Admitting: Family Medicine

## 2023-02-10 DIAGNOSIS — E1122 Type 2 diabetes mellitus with diabetic chronic kidney disease: Secondary | ICD-10-CM

## 2023-02-10 DIAGNOSIS — I5030 Unspecified diastolic (congestive) heart failure: Secondary | ICD-10-CM

## 2023-02-10 DIAGNOSIS — I129 Hypertensive chronic kidney disease with stage 1 through stage 4 chronic kidney disease, or unspecified chronic kidney disease: Secondary | ICD-10-CM

## 2023-02-10 DIAGNOSIS — D631 Anemia in chronic kidney disease: Secondary | ICD-10-CM

## 2023-02-10 DIAGNOSIS — N184 Chronic kidney disease, stage 4 (severe): Secondary | ICD-10-CM

## 2023-02-17 ENCOUNTER — Ambulatory Visit
Admission: RE | Admit: 2023-02-17 | Discharge: 2023-02-17 | Disposition: A | Discharge status: Home / Self Care | Payer: PPO | Source: Ambulatory Visit | Attending: Nephrology | Admitting: Nephrology

## 2023-02-17 ENCOUNTER — Encounter (HOSPITAL_BASED_OUTPATIENT_CLINIC_OR_DEPARTMENT_OTHER): Payer: Self-pay | Admitting: Cardiovascular Disease

## 2023-02-17 ENCOUNTER — Ambulatory Visit (INDEPENDENT_AMBULATORY_CARE_PROVIDER_SITE_OTHER): Payer: PPO | Admitting: Cardiovascular Disease

## 2023-02-17 VITALS — BP 122/62 | HR 76 | Ht 67.0 in | Wt 170.0 lb

## 2023-02-17 DIAGNOSIS — I129 Hypertensive chronic kidney disease with stage 1 through stage 4 chronic kidney disease, or unspecified chronic kidney disease: Secondary | ICD-10-CM

## 2023-02-17 DIAGNOSIS — N184 Chronic kidney disease, stage 4 (severe): Secondary | ICD-10-CM | POA: Diagnosis not present

## 2023-02-17 DIAGNOSIS — I152 Hypertension secondary to endocrine disorders: Secondary | ICD-10-CM

## 2023-02-17 DIAGNOSIS — E1159 Type 2 diabetes mellitus with other circulatory complications: Secondary | ICD-10-CM

## 2023-02-17 DIAGNOSIS — N189 Chronic kidney disease, unspecified: Secondary | ICD-10-CM

## 2023-02-17 DIAGNOSIS — Z794 Long term (current) use of insulin: Secondary | ICD-10-CM

## 2023-02-17 DIAGNOSIS — I5030 Unspecified diastolic (congestive) heart failure: Secondary | ICD-10-CM

## 2023-02-17 DIAGNOSIS — I5032 Chronic diastolic (congestive) heart failure: Secondary | ICD-10-CM

## 2023-02-17 DIAGNOSIS — E1122 Type 2 diabetes mellitus with diabetic chronic kidney disease: Secondary | ICD-10-CM

## 2023-02-17 DIAGNOSIS — E1169 Type 2 diabetes mellitus with other specified complication: Secondary | ICD-10-CM | POA: Diagnosis not present

## 2023-02-17 DIAGNOSIS — E785 Hyperlipidemia, unspecified: Secondary | ICD-10-CM | POA: Diagnosis not present

## 2023-02-17 DIAGNOSIS — I2510 Atherosclerotic heart disease of native coronary artery without angina pectoris: Secondary | ICD-10-CM | POA: Diagnosis not present

## 2023-02-17 DIAGNOSIS — N289 Disorder of kidney and ureter, unspecified: Secondary | ICD-10-CM | POA: Diagnosis not present

## 2023-02-17 NOTE — Progress Notes (Signed)
Cardiology Office Note:  .    Date:  02/17/2023  ID:  Jeffrey [name], DOB 11-24-1955, MRN [id number] PCP: Jeffrey Dark, MD  [facility] Providers Nephrologist: Dr. Crista [name] Cardiologist:  Jeffrey Si, MD     History of Present Illness: Jeffrey [name]    Jeffrey [name] is a 67 y.o. male with CAD s/p MI and LAD PCI, hypertension, hyperlipidemia, CKD 3, diabetes and peripheral neuropathy who presents for follow up.  Jeffrey [name] had a cardiac arrest 11/23/13 and underwent PCI of the LAD.  Echo 10/2015 revealed an anteroseptal and apical septal infarct with associated hypokinesis.  LVEF was 55%.  He had a PET CT/myocardial perfusion scan 10/2015 that revealed a moderate sized, moderate severe anteroapical infarct with mild ischemia.  At the end of 2016 he had an episode of stabbing chest pain.  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  At that time his clopidogrel was discontinued and he underwent several laser procedures.  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     Jeffrey [name] reported atypical chest pain. He was referred for [facility] 09/2017 that revealed LVEF 48% and a prior infarct in the apical inferior, apical lateral, and apical regions. There was no ischemia. He has struggled with syncope and his antihypertensives were reduced at prior appointments. Jeffrey [name] was seen in the ED 05/2019 with an episode of syncope. There was no preceding chest pain but he did start feeling dizzy. He believed that this was mostly due to anxiety and getting worked up. This occurred in the setting of generalized weakness, weight loss, and loose stools. His work-up was remarkable only for mild intravascular volume depletion. He received IV fluids and was instructed to follow-up with his PCP. He saw Dr. Jimmey [name] the following day who recommended an echocardiogram but he  declined.     He required injections in his L eye for diabetic retinopathy and vitreous hemorrhage.  His vision improved. Given his diabetes and CKD, spironolactone was switched to losartan. Jeffrey [name] was last seen 09/2021 and reported pinching chest pain.  At the time he was working out in the gym and was thought to be musculoskeletal.  He noted that he stopped taking [place] due to side effects and intermittent feet swelling. He was mildly volume overloaded so we increased his Lasix to 80 mg dialy for 2 days, then resumed 40 mg daily. Given fatigue and hypotension we reduced spironolactone to 12.5 mg daily. In 11/2021 he messaged the office reporting that his chest pain had resolved and he was feeling better.   Today, he is accompanied by a family member. He states he isn't feeling great, recently diagnosed with CKD stage IV. In 07/2021 his GFR was 30; as of 11/2022 his GFR was 26. Last week he saw his nephrologist Dr. Ronalee [name] and was advised to stop spironolactone. He did this for 3 days, but felt like his whole body changed. He had no energy, and felt like he would have a heart attack. Notes that this is similar to how he felt in the past when his spironolactone was previously discontinued in the hospital. Generally he feels better when taking the spironolactone; he had resumed it after stopping for 3 days. This afternoon he has a scheduled kidney ultrasound. Typically he drinks decaffeinated coffee, and drinks up to 8 bottles of water every  day. We discussed that after stopping Lasix he may need to cut back on his water intake if he develops swelling.  He reports that his average blood sugars were 146 over the last 7 days. He denies any recent issues with leg swelling. When he exercises he usually feels well without any chest pain or significant shortness of breath. He recently joined a new exercise club. He denies any palpitations, lightheadedness, headaches, syncope, orthopnea, or PND.  ROS:  Please see  the history of present illness. All other systems are reviewed and negative.   Studies Reviewed: .        Risk Assessment/Calculations:             Physical Exam:    VS:  BP 122/62 (BP Location: Right Arm, Patient Position: Sitting, Cuff Size: Normal)   Pulse 76   Ht 5\' 7"  (1.702 m)   Wt 170 lb (77.1 kg)   BMI 26.63 kg/m  , BMI Body mass index is 26.63 kg/m. GENERAL:  Frail.  No acute distress. HEENT: Pupils equal round and reactive, fundi not visualized, oral mucosa unremarkable NECK:  No jugular venous distention, waveform within normal limits, carotid upstroke brisk and symmetric, no bruits, no thyromegaly LUNGS:  Clear to auscultation bilaterally HEART:  RRR.  PMI not displaced or sustained,S1 and S2 within normal limits, no S3, no S4, no clicks, no rubs, no murmurs ABD:  Flat, positive bowel sounds normal in frequency in pitch, no bruits, no rebound, no guarding, no midline pulsatile mass, no hepatomegaly, no splenomegaly EXT:  2 plus pulses throughout, no edema, no cyanosis no clubbing SKIN:  No rashes no nodules NEURO:  Cranial nerves II through XII grossly intact, motor grossly intact throughout PSYCH:  Cognitively intact, oriented to person place and time  Wt Readings from Last 3 Encounters:  02/17/23 170 lb (77.1 kg)  12/18/22 169 lb 9.6 oz (76.9 kg)  09/12/22 167 lb (75.8 kg)     ASSESSMENT AND PLAN: .    # Chronic Kidney Disease (Stage 4): GFR 25.  Gradual decline from 30 in 2022.,  He was advised to stop spironolactone by his nephrologist but felt very poorly after stopping it for two days and he decided to start back taking it.  He is unwilling to discontinue it.  We did discuss that he may have felt poorly due to elevated BP after stopping the medication, but he didn't check his BP at thaat time.   He is willing to hold furosemide instead.  He will only take it as needed for LE edema or shortness of breath.  Ultrasound of kidneys scheduled for today. -Continue  Spironolactone due to patient's preference and positive response. -Stop Furosemide and use as needed if swelling occurs. -Notify nephrologist of plan.  # Hypertension: Well-controlled with current regimen. -Continue carvedilol and spironolactone - Holding furosemide as above   # CAD s/p PCI: # Hyperlipidemia: LDL well-controlled on atorvastatin.  -Continue current lipid-lowering therapy. -Resume exercise - Continue aspirin  # Diabetes Mellitus: Recent average glucose readings improved, but last HbA1c slightly elevated. -Continue current antidiabetic medications. -Encourage continued physical activity and adherence to diabetic diet.  # Nutrition: Patient experiencing difficulty reconciling dietary recommendations for kidney disease and diabetes. -Refer to a nutritionist for personalized dietary plan.      Dispo:  FU with Gillian Shields, NP in 6 months. FU with Gabriella Woodhead C. Duke Salvia, MD, [facility] in 1 year.  I,Mathew Stumpf,acting as a Neurosurgeon for [employer], MD.,have documented all relevant  documentation on the behalf of Jeffrey Si, MD,as directed by  Jeffrey Si, MD while in the presence of Jeffrey Si, MD.  I, Teniqua Marron C. Duke Salvia, MD have reviewed all documentation for this visit.  The documentation of the exam, diagnosis, procedures, and orders on 02/17/2023 are all accurate and complete.   Signed, Jeffrey Si, MD

## 2023-02-17 NOTE — Progress Notes (Deleted)
Cardiology Office Note:  .   Date:  02/17/2023  ID:  Jeffrey [name], DOB 1956-06-28, MRN [id number] PCP: Ardith Dark, MD  [facility] Providers Cardiologist:  Chilton Si, MD { Click to update primary MD,subspecialty MD or APP then REFRESH:1}   History of Present Illness: [name] Kitchen   [name] is a 67 y.o. male with CAD s/p MI and LAD PCI, hypertension, hyperlipidemia, CKD 3, diabetes and peripheral neuropathy who presents for follow up.  Jeffrey [name] had a cardiac arrest 11/23/13 and underwent PCI of the LAD.  Echo 10/2015 revealed an anteroseptal and apical septal infarct with associated hypokinesis.  LVEF was 55%.  He had a PET CT/myocardial perfusion scan 10/2015 that revealed a moderate sized, moderate severe anteroapical infarct with mild ischemia.  At the end of 2016 he had an episode of stabbing chest pain.  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  At that time his clopidogrel was discontinued and he underwent several laser procedures.  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     Jeffrey [name] reported atypical chest pain. He was referred for [facility] 09/2017 that revealed LVEF 48% and a prior infarct in the apical inferior, apical lateral, and apical regions. There was no ischemia. He has struggled with syncope and his antihypertensives were reduced at prior appointments. Jeffrey [name] was seen in the ED 05/2019 with an episode of syncope. There was no preceding chest pain but he did start feeling dizzy. He believed that this was mostly due to anxiety and getting worked up. This occurred in the setting of generalized weakness, weight loss, and loose stools. His work-up was remarkable only for mild intravascular volume depletion. He received IV fluids and was instructed to follow-up with his PCP. He saw Dr. [name] the following day who recommended an  echocardiogram but he declined.     He required injections in his L eye for diabetic retinopathy and vitreous hemorrhage.  His vision improved. Given his diabetes and CKD, spironolactone was switched to losartan. Jeffrey [name] was last seen 09/2021 and reported pinching chest pain.  At the time he was working out in the gym and was thought to be musculoskeletal.  He noted that he stopped taking [place] due to side effects and intermittent feet swelling.   He called the office today reporting a pinching chest pain. Today, he is accompanied by a family member. 4 weeks ago, he felt chest pain he describes as a "jolt" in his central chest. The pain is faint and dissimilar to what he felt during his MI. Palpation does not worsen the pain. Recently, the chest pain has been occurring more frequently. The pain also causes him to feel fatigued and tired which is preventing him from performing activities like going to the gym and getting in the shower. He joined the gym in [place] and wonders if the weights are too strenuous for his heart. Since the pain started, he is hesitant to go back to the gym. Last night, his bilateral feet were swollen "like a balloon" and the skin was shiny. He has trouble balancing but he does not relate the imbalance to the swelling. He stopped taking [place] because his blood sugar was not controlled and he was experiencing constipation. His stool was hard and painful and needed up to 4 enemas to help them pass. He  denies any palpitations, lightheadedness, headaches, syncope, orthopnea, or PND.  ROS: ***  Studies Reviewed: .       Echo 07/2019:   1. Left ventricular ejection fraction, by visual estimation, is 50 to  55%. The left ventricle has low normal function. Left ventricular septal  wall thickness was mildly increased. There is no left ventricular  hypertrophy.   2. The left ventricle demonstrates regional wall motion abnormalities.   3. Normal GLS -18 Inferior basal and  distal septal hypokinesis.   4. Global right ventricle has normal systolic function.The right  ventricular size is normal. No increase in right ventricular wall  thickness.   5. Left atrial size was mildly dilated.   6. Right atrial size was normal.   7. The mitral valve is normal in structure. Trivial mitral valve  regurgitation. No evidence of mitral stenosis.   8. The tricuspid valve is normal in structure. Tricuspid valve  regurgitation is not demonstrated.   9. The aortic valve is tricuspid. Aortic valve regurgitation is mild.  Mild to moderate aortic valve sclerosis/calcification without any evidence  of aortic stenosis.  10. The pulmonic valve was normal in structure. Pulmonic valve  regurgitation is not visualized.  11. Aortic dilatation noted.  12. There is mild dilatation of the aortic root measuring 39 mm.  13. The inferior vena cava is normal in size with greater than 50%  respiratory variability, suggesting right atrial pressure of 3 mmHg.  Risk Assessment/Calculations:   {Does this patient have ATRIAL FIBRILLATION?[phone]} No BP recorded.  {Refresh Note OR Click here to enter BP  :1}***       Physical Exam:   VS:  There were no vitals taken for this visit.   Wt Readings from Last 3 Encounters:  12/18/22 169 lb 9.6 oz (76.9 kg)  09/12/22 167 lb (75.8 kg)  08/13/22 167 lb (75.8 kg)    GEN: Well nourished, well developed in no acute distress NECK: No JVD; No carotid bruits CARDIAC: ***RRR, no murmurs, rubs, gallops RESPIRATORY:  Clear to auscultation without rales, wheezing or rhonchi  ABDOMEN: Soft, non-tender, non-distended EXTREMITIES:  No edema; No deformity   ASSESSMENT AND PLAN: .   ***    {Are you ordering a CV Procedure (e.g. stress test, cath, DCCV, TEE, etc)?   Press F2        :[id number]}  Dispo: ***  Signed, Chilton Si, MD

## 2023-02-17 NOTE — Patient Instructions (Signed)
Medication Instructions:  CHANGE YOUR FUROSEMIDE TO AS NEED FOR SWELLING OR SHORTNESS OF BREAT   *If you need a refill on your cardiac medications before your next appointment, please call your pharmacy*  Lab Work: NONE  Testing/Procedures: NONE  Follow-Up: At [facility], you and your health needs are our priority.  As part of our continuing mission to provide you with exceptional heart care, we have created designated Provider Care Teams.  These Care Teams include your primary Cardiologist (physician) and Advanced Practice Providers (APPs -  Physician Assistants and Nurse Practitioners) who all work together to provide you with the care you need, when you need it.  We recommend signing up for the patient portal called "MyChart".  Sign up information is provided on this After Visit Summary.  MyChart is used to connect with patients for Virtual Visits (Telemedicine).  Patients are able to view lab/test results, encounter notes, upcoming appointments, etc.  Non-urgent messages can be sent to your provider as well.   To learn more about what you can do with MyChart, go to [email or web address].    Your next appointment:   6 month(s)  Provider:   Gillian Shields, NP   12 MONTHS WITH DR [facility]    You have been referred to NUTRITIONIST

## 2023-02-18 ENCOUNTER — Encounter: Payer: Self-pay | Admitting: Family Medicine

## 2023-02-25 ENCOUNTER — Encounter: Payer: Self-pay | Admitting: Family Medicine

## 2023-02-25 ENCOUNTER — Ambulatory Visit (INDEPENDENT_AMBULATORY_CARE_PROVIDER_SITE_OTHER): Payer: PPO | Admitting: Family Medicine

## 2023-02-25 VITALS — BP 136/83 | HR 78 | Temp 97.3°F | Ht 67.0 in | Wt 169.6 lb

## 2023-02-25 DIAGNOSIS — I152 Hypertension secondary to endocrine disorders: Secondary | ICD-10-CM

## 2023-02-25 DIAGNOSIS — Z794 Long term (current) use of insulin: Secondary | ICD-10-CM

## 2023-02-25 DIAGNOSIS — E1142 Type 2 diabetes mellitus with diabetic polyneuropathy: Secondary | ICD-10-CM | POA: Diagnosis not present

## 2023-02-25 DIAGNOSIS — E1159 Type 2 diabetes mellitus with other circulatory complications: Secondary | ICD-10-CM | POA: Diagnosis not present

## 2023-02-25 DIAGNOSIS — J329 Chronic sinusitis, unspecified: Secondary | ICD-10-CM | POA: Diagnosis not present

## 2023-02-25 MED ORDER — AZELASTINE HCL 0.1 % NA SOLN: 2.0000 | Freq: Two times a day (BID) | NASAL | 12 refills | Status: DC

## 2023-02-25 MED ORDER — AZITHROMYCIN 250 MG PO TABS
ORAL_TABLET | ORAL | 0 refills | Status: DC
Start: 2023-02-25 — End: 2023-03-24

## 2023-02-25 MED ORDER — BENZONATATE 200 MG PO CAPS: 200.0000 mg | ORAL_CAPSULE | Freq: Two times a day (BID) | ORAL | 0 refills | Status: DC | PRN

## 2023-02-25 NOTE — Progress Notes (Signed)
   Jeffrey [name] is a 67 y.o. male who presents today for an office visit.  Assessment/Plan:  New/Acute Problems: Sinusitis  No red flags.  Reassuring lung exam today.  Given length of symptoms will start antibiotics.  Has a penicillin allergy.  Will start azithromycin.  Also start Astelin.  Also send in Tessalon for cough.  He can continue with over-the-counter medications.  Encouraged hydration.  We discussed reasons to return to care.  Follow-up as needed.  Chronic Problems Addressed Today: Hypertension associated with diabetes ([name]) Blood pressure at goal on Coreg 3.125 mg twice daily and spironolactone 25 mg daily.  [name] Home sugars have been goal.  Too early to recheck A1c today.  He is on Lantus 38 units daily.  He is working on diet and exercise as well.  Will recheck next office visit.     Subjective:  HPI:  See A/P for status of chronic conditions.  Patient is here for cough and congestion for the last couple of weeks.This has interferred with his sleep. Cough is productive of white sputum. Mucus appears "stringy." Tried benadryl which has not helped. No fevers or chills. No shortness of breath.        Objective:  Physical Exam: BP 136/83   Pulse 78   Temp (!) 97.3 F (36.3 C) (Temporal)   Ht 5\' 7"  (1.702 m)   Wt 169 lb 9.6 oz (76.9 kg)   SpO2 97%   BMI 26.56 kg/m   Gen: No acute distress, resting comfortably HEENT: TMs clear.  OP erythematous. CV: Regular rate and rhythm with no murmurs appreciated Pulm: Normal work of breathing, clear to auscultation bilaterally with no crackles, wheezes, or rhonchi Neuro: Grossly normal, moves all extremities Psych: Normal affect and thought content      Jeffrey [name] M. [name], MD 02/25/2023 10:52 AM

## 2023-02-25 NOTE — Patient Instructions (Signed)
It was very nice to see you today!  You have a sinus infection.  Please start the antibiotics.  Take the nasal spray and cough medication.  Return if symptoms worsen or fail to improve.   Take care, Dr [name]  PLEASE NOTE:  If you had any lab tests, please let us know if you have not heard back within a few days. You may see your results on mychart before we have a chance to review them but we will give you a call once they are reviewed by [place].   If we ordered any referrals today, please let us know if you have not heard from their office within the next week.   If you had any urgent prescriptions sent in today, please check with the pharmacy within an hour of our visit to make sure the prescription was transmitted appropriately.   Please try these tips to maintain a healthy lifestyle:  Eat at least 3 REAL meals and 1-2 snacks per day.  Aim for no more than 5 hours between eating.  If you eat breakfast, please do so within one hour of getting up.   Each meal should contain half fruits/vegetables, one quarter protein, and one quarter carbs (no bigger than a computer mouse)  Cut down on sweet beverages. This includes juice, soda, and sweet tea.   Drink at least 1 glass of water with each meal and aim for at least 8 glasses per day  Exercise at least 150 minutes every week.

## 2023-02-25 NOTE — Assessment & Plan Note (Signed)
Blood pressure at goal on Coreg 3.125 mg twice daily and spironolactone 25 mg daily.

## 2023-02-25 NOTE — Assessment & Plan Note (Signed)
Home sugars have been goal.  Too early to recheck A1c today.  He is on Lantus 38 units daily.  He is working on diet and exercise as well.  Will recheck next office visit.

## 2023-02-26 ENCOUNTER — Ambulatory Visit: Payer: PPO | Admitting: Neurology

## 2023-02-27 ENCOUNTER — Other Ambulatory Visit: Payer: Self-pay | Admitting: Family Medicine

## 2023-03-02 ENCOUNTER — Other Ambulatory Visit: Payer: HMO

## 2023-03-03 ENCOUNTER — Ambulatory Visit: Payer: PPO | Admitting: Family Medicine

## 2023-03-05 ENCOUNTER — Other Ambulatory Visit: Payer: Self-pay | Admitting: *Deleted

## 2023-03-05 MED ORDER — PEN NEEDLES 33G X 4 MM MISC: 1.0000 "application " | Freq: Four times a day (QID) | 99 refills | Status: DC

## 2023-03-11 ENCOUNTER — Encounter (INDEPENDENT_AMBULATORY_CARE_PROVIDER_SITE_OTHER): Payer: Self-pay

## 2023-03-12 ENCOUNTER — Telehealth: Payer: Self-pay | Admitting: Podiatry

## 2023-03-12 ENCOUNTER — Other Ambulatory Visit: Payer: Self-pay | Admitting: *Deleted

## 2023-03-12 DIAGNOSIS — E1142 Type 2 diabetes mellitus with diabetic polyneuropathy: Secondary | ICD-10-CM

## 2023-03-12 NOTE — Telephone Encounter (Signed)
Auth from [facility]  for orthotics    Auth # [id number]  03/10/23- 06/04/23

## 2023-03-12 NOTE — Telephone Encounter (Signed)
Error

## 2023-03-13 ENCOUNTER — Other Ambulatory Visit: Payer: HMO

## 2023-03-16 ENCOUNTER — Other Ambulatory Visit: Payer: HMO

## 2023-03-19 ENCOUNTER — Encounter: Payer: Self-pay | Admitting: Family Medicine

## 2023-03-20 ENCOUNTER — Other Ambulatory Visit: Payer: PPO

## 2023-03-23 ENCOUNTER — Other Ambulatory Visit (INDEPENDENT_AMBULATORY_CARE_PROVIDER_SITE_OTHER): Payer: PPO

## 2023-03-23 DIAGNOSIS — Z794 Long term (current) use of insulin: Secondary | ICD-10-CM | POA: Diagnosis not present

## 2023-03-23 DIAGNOSIS — E1142 Type 2 diabetes mellitus with diabetic polyneuropathy: Secondary | ICD-10-CM

## 2023-03-23 LAB — COMPREHENSIVE METABOLIC PANEL
ALT: 41 U/L (ref 0–53)
AST: 26 U/L (ref 0–37)
Albumin: 3.9 g/dL (ref 3.5–5.2)
Alkaline Phosphatase: 60 U/L (ref 39–117)
BUN: 59 mg/dL — ABNORMAL HIGH (ref 6–23)
CO2: 25 mEq/L (ref 19–32)
Calcium: 9 mg/dL (ref 8.4–10.5)
Chloride: 101 mEq/L (ref 96–112)
Creatinine, Ser: 2.69 mg/dL — ABNORMAL HIGH (ref 0.40–1.50)
GFR: 23.61 mL/min — ABNORMAL LOW (ref >60.00)
Glucose, Bld: 117 mg/dL — ABNORMAL HIGH (ref 70–99)
Potassium: 4.5 mEq/L (ref 3.5–5.1)
Sodium: 133 mEq/L — ABNORMAL LOW (ref 135–145)
Total Bilirubin: 0.4 mg/dL (ref 0.2–1.2)
Total Protein: 6.7 g/dL (ref 6.0–8.3)

## 2023-03-23 LAB — HEMOGLOBIN A1C: Hgb A1c MFr Bld: 8.1 % — ABNORMAL HIGH (ref 4.6–6.5)

## 2023-03-23 LAB — MICROALBUMIN / CREATININE URINE RATIO
Creatinine,U: 81.7 mg/dL
Microalb Creat Ratio: 0.9 mg/g (ref 0.0–30.0)
Microalb, Ur: <0.7 mg/dL (ref 0.0–1.9)

## 2023-03-23 NOTE — Progress Notes (Signed)
A1c 8.1.  Up a little bit since last time but overall stable.  His creatinine is 2.69.  This is in line already has been for the last few months.  We can discuss more at upcoming visit tomorrow.

## 2023-03-24 ENCOUNTER — Ambulatory Visit (INDEPENDENT_AMBULATORY_CARE_PROVIDER_SITE_OTHER): Payer: PPO | Admitting: Family Medicine

## 2023-03-24 ENCOUNTER — Encounter: Payer: Self-pay | Admitting: Family Medicine

## 2023-03-24 VITALS — BP 130/77 | HR 87 | Temp 97.5°F | Ht 67.0 in | Wt 172.0 lb

## 2023-03-24 DIAGNOSIS — Z794 Long term (current) use of insulin: Secondary | ICD-10-CM | POA: Diagnosis not present

## 2023-03-24 DIAGNOSIS — N184 Chronic kidney disease, stage 4 (severe): Secondary | ICD-10-CM | POA: Diagnosis not present

## 2023-03-24 DIAGNOSIS — I152 Hypertension secondary to endocrine disorders: Secondary | ICD-10-CM

## 2023-03-24 DIAGNOSIS — E1159 Type 2 diabetes mellitus with other circulatory complications: Secondary | ICD-10-CM | POA: Diagnosis not present

## 2023-03-24 DIAGNOSIS — E1142 Type 2 diabetes mellitus with diabetic polyneuropathy: Secondary | ICD-10-CM | POA: Diagnosis not present

## 2023-03-24 NOTE — Patient Instructions (Addendum)
It was very nice to see you today!  Please increase your lantus to 44 units daily.  Please check your sugar after you eat. If your sugar is more than 200, then take 3 units of novolog.  Do not take novolog at any other time of day.   Return in about 3 months (around 06/24/2023) for Follow Up.   Take care, Dr [name]  PLEASE NOTE:  If you had any lab tests, please let us know if you have not heard back within a few days. You may see your results on mychart before we have a chance to review them but we will give you a call once they are reviewed by [place].   If we ordered any referrals today, please let us know if you have not heard from their office within the next week.   If you had any urgent prescriptions sent in today, please check with the pharmacy within an hour of our visit to make sure the prescription was transmitted appropriately.   Please try these tips to maintain a healthy lifestyle:  Eat at least 3 REAL meals and 1-2 snacks per day.  Aim for no more than 5 hours between eating.  If you eat breakfast, please do so within one hour of getting up.   Each meal should contain half fruits/vegetables, one quarter protein, and one quarter carbs (no bigger than a computer mouse)  Cut down on sweet beverages. This includes juice, soda, and sweet tea.   Drink at least 1 glass of water with each meal and aim for at least 8 glasses per day  Exercise at least 150 minutes every week.

## 2023-03-24 NOTE — Progress Notes (Signed)
   Jeffrey [name] is a 67 y.o. male who presents today for an office visit.  Assessment/Plan:  Chronic Problems Addressed Today: [name] Had a lengthy discussion with patient regarding his recent A1c of 8.1.  Ideally would like to get this closer to the 7 range.  We will increase his dose of Lantus to 44 units daily.  He is having quite a bit of spikes in his postprandial readings which is likely the main contributor for his elevated A1c.  They would like to avoid GLP-1 agonist due to his history of diabetic retinopathy.  They do have NovoLog on hand however they have been very hesitant to use this due to prior issues with hypoglycemic episodes.  Advised them to check sugar after eating and inject 3 units if sugar is greater than 200.  Hopefully this will help avoid any hypoglycemic events.  They would not take NovoLog any other times a day.  They can follow-up with [place] in a few weeks via MyChart and we can titrate dose of insulin as needed.  Recheck A1c in 3 months.  Hypertension associated with diabetes ([name]) Blood sugar at goal today on Coreg 3.125 mg twice daily and spironolactone 25 mg daily.  CKD (chronic kidney disease) stage 4, GFR 15-29 ml/min ([name]) Following with nephrology.  Recent ultrasound showed chronic renal medical disease.     Subjective:  HPI:  See A/P for status of chronic conditions.  Patient is here today for follow-up several months ago for a sinus infection.  Started azithromycin, Astelin, and Tessalon.  He has done well with this.  He did have labs recently which showed an A1c of 8.1.  He is on Lantus 38 units daily. Labs also showed  Creatinine 2.69 which is slightly up from his baseline.  He has been following with nephrology and recently had a renal ultrasound which showed chronic medical renal disease.  They have recently increased Lantus to 40 units daily.  Home sugars have typically been in the 90s at home.  He is having several  postprandial readings after lunch and dinner into the 200s.  He does occasionally take his NovoLog 3 units however they are hesitant to do this due to prior issues with hypoglycemia resulting in EMS being called.       Objective:  Physical Exam: BP 130/77   Pulse 87   Temp (!) 97.5 F (36.4 C) (Temporal)   Ht 5\' 7"  (1.702 m)   Wt 172 lb (78 kg)   SpO2 98%   BMI 26.94 kg/m   Gen: No acute distress, resting comfortably Neuro: Grossly normal, moves all extremities Psych: Normal affect and thought content  Time Spent: 40 minutes of total time was spent on the date of the encounter performing the following actions: chart review prior to seeing the patient, obtaining history, performing a medically necessary exam, counseling on the treatment plan including adjustments in insulin dosing, placing orders, and documenting in our EHR.       Jeffrey [name]. Jeffrey Ralph, MD 03/24/2023 10:41 AM

## 2023-03-24 NOTE — Assessment & Plan Note (Signed)
Had a lengthy discussion with patient regarding his recent A1c of 8.1.  Ideally would like to get this closer to the 7 range.  We will increase his dose of Lantus to 44 units daily.  He is having quite a bit of spikes in his postprandial readings which is likely the main contributor for his elevated A1c.  They would like to avoid GLP-1 agonist due to his history of diabetic retinopathy.  They do have NovoLog on hand however they have been very hesitant to use this due to prior issues with hypoglycemic episodes.  Advised them to check sugar after eating and inject 3 units if sugar is greater than 200.  Hopefully this will help avoid any hypoglycemic events.  They would not take NovoLog any other times a day.  They can follow-up with [place] in a few weeks via MyChart and we can titrate dose of insulin as needed.  Recheck A1c in 3 months.

## 2023-03-24 NOTE — Assessment & Plan Note (Signed)
Blood sugar at goal today on Coreg 3.125 mg twice daily and spironolactone 25 mg daily.

## 2023-03-24 NOTE — Assessment & Plan Note (Signed)
Following with nephrology.  Recent ultrasound showed chronic renal medical disease.

## 2023-04-07 ENCOUNTER — Other Ambulatory Visit: Payer: Self-pay | Admitting: Family Medicine

## 2023-04-08 DIAGNOSIS — E113593 Type 2 diabetes mellitus with proliferative diabetic retinopathy without macular edema, bilateral: Secondary | ICD-10-CM | POA: Diagnosis not present

## 2023-04-08 DIAGNOSIS — H25813 Combined forms of age-related cataract, bilateral: Secondary | ICD-10-CM | POA: Diagnosis not present

## 2023-04-08 DIAGNOSIS — H524 Presbyopia: Secondary | ICD-10-CM | POA: Diagnosis not present

## 2023-04-08 LAB — HM DIABETES EYE EXAM

## 2023-04-15 ENCOUNTER — Ambulatory Visit: Payer: PPO

## 2023-04-16 ENCOUNTER — Other Ambulatory Visit: Payer: Self-pay | Admitting: *Deleted

## 2023-04-16 ENCOUNTER — Ambulatory Visit (INDEPENDENT_AMBULATORY_CARE_PROVIDER_SITE_OTHER): Payer: PPO | Admitting: *Deleted

## 2023-04-16 DIAGNOSIS — R7989 Other specified abnormal findings of blood chemistry: Secondary | ICD-10-CM

## 2023-04-16 DIAGNOSIS — Z23 Encounter for immunization: Secondary | ICD-10-CM | POA: Diagnosis not present

## 2023-04-20 DIAGNOSIS — N184 Chronic kidney disease, stage 4 (severe): Secondary | ICD-10-CM | POA: Diagnosis not present

## 2023-04-22 ENCOUNTER — Ambulatory Visit: Payer: HMO | Admitting: Podiatry

## 2023-04-29 DIAGNOSIS — H3582 Retinal ischemia: Secondary | ICD-10-CM | POA: Diagnosis not present

## 2023-04-29 DIAGNOSIS — E113513 Type 2 diabetes mellitus with proliferative diabetic retinopathy with macular edema, bilateral: Secondary | ICD-10-CM | POA: Diagnosis not present

## 2023-04-29 DIAGNOSIS — H31093 Other chorioretinal scars, bilateral: Secondary | ICD-10-CM | POA: Diagnosis not present

## 2023-04-29 DIAGNOSIS — H2513 Age-related nuclear cataract, bilateral: Secondary | ICD-10-CM | POA: Diagnosis not present

## 2023-04-30 DIAGNOSIS — E559 Vitamin D deficiency, unspecified: Secondary | ICD-10-CM | POA: Diagnosis not present

## 2023-04-30 DIAGNOSIS — N184 Chronic kidney disease, stage 4 (severe): Secondary | ICD-10-CM | POA: Diagnosis not present

## 2023-04-30 DIAGNOSIS — E1122 Type 2 diabetes mellitus with diabetic chronic kidney disease: Secondary | ICD-10-CM | POA: Diagnosis not present

## 2023-04-30 DIAGNOSIS — I129 Hypertensive chronic kidney disease with stage 1 through stage 4 chronic kidney disease, or unspecified chronic kidney disease: Secondary | ICD-10-CM | POA: Diagnosis not present

## 2023-04-30 DIAGNOSIS — I5030 Unspecified diastolic (congestive) heart failure: Secondary | ICD-10-CM | POA: Diagnosis not present

## 2023-04-30 DIAGNOSIS — D631 Anemia in chronic kidney disease: Secondary | ICD-10-CM | POA: Diagnosis not present

## 2023-05-03 ENCOUNTER — Other Ambulatory Visit: Payer: Self-pay | Admitting: Family Medicine

## 2023-05-11 ENCOUNTER — Encounter: Payer: Self-pay | Admitting: Family Medicine

## 2023-05-12 ENCOUNTER — Encounter: Payer: Self-pay | Admitting: Family Medicine

## 2023-05-12 ENCOUNTER — Other Ambulatory Visit (INDEPENDENT_AMBULATORY_CARE_PROVIDER_SITE_OTHER): Payer: PPO

## 2023-05-12 DIAGNOSIS — R7989 Other specified abnormal findings of blood chemistry: Secondary | ICD-10-CM | POA: Diagnosis not present

## 2023-05-12 LAB — VITAMIN D 25 HYDROXY (VIT D DEFICIENCY, FRACTURES): VITD: 33.38 ng/mL (ref 30.00–100.00)

## 2023-05-12 NOTE — Telephone Encounter (Signed)
Spoke with patient stated no need for Kidney problems  Due to possible confused with twin brother

## 2023-05-12 NOTE — Progress Notes (Signed)
Vitamin D level is at goal.  He should continue supplementation 1000 to 2000 IUs once daily.  We can recheck in 3 to 6 months.

## 2023-05-20 ENCOUNTER — Encounter: Payer: Self-pay | Admitting: Family Medicine

## 2023-05-20 ENCOUNTER — Ambulatory Visit (INDEPENDENT_AMBULATORY_CARE_PROVIDER_SITE_OTHER): Payer: PPO | Admitting: Family Medicine

## 2023-05-20 VITALS — BP 124/61 | HR 73 | Temp 98.0°F | Ht 67.0 in | Wt 172.0 lb

## 2023-05-20 DIAGNOSIS — Z794 Long term (current) use of insulin: Secondary | ICD-10-CM | POA: Diagnosis not present

## 2023-05-20 DIAGNOSIS — I152 Hypertension secondary to endocrine disorders: Secondary | ICD-10-CM

## 2023-05-20 DIAGNOSIS — E1142 Type 2 diabetes mellitus with diabetic polyneuropathy: Secondary | ICD-10-CM

## 2023-05-20 DIAGNOSIS — E1159 Type 2 diabetes mellitus with other circulatory complications: Secondary | ICD-10-CM | POA: Diagnosis not present

## 2023-05-20 DIAGNOSIS — J310 Chronic rhinitis: Secondary | ICD-10-CM | POA: Diagnosis not present

## 2023-05-20 MED ORDER — "INSULIN SYRINGE-NEEDLE U-100 31G X 5/16"" 0.3 ML MISC": 11 refills | Status: DC

## 2023-05-20 MED ORDER — LANTUS SOLOSTAR 100 UNIT/ML ~~LOC~~ SOPN: 34.0000 [IU] | PEN_INJECTOR | Freq: Every day | SUBCUTANEOUS | 1 refills | Status: DC

## 2023-05-20 MED ORDER — ONETOUCH VERIO VI STRP: ORAL_STRIP | 1 refills | Status: DC

## 2023-05-20 MED ORDER — INSULIN ASPART 100 UNIT/ML IJ SOLN: INTRAMUSCULAR | Status: DC

## 2023-05-20 MED ORDER — ONETOUCH DELICA LANCETS 33G MISC: 11 refills | Status: AC

## 2023-05-20 NOTE — Patient Instructions (Addendum)
It was very nice to see you today!  It is ok for you to continue with the OTC medications.   We will see you back soon to recheck your A1c.  Return for Follow Up.   Take care, Dr [name]  PLEASE NOTE:  If you had any lab tests, please let us know if you have not heard back within a few days. You may see your results on mychart before we have a chance to review them but we will give you a call once they are reviewed by [place].   If we ordered any referrals today, please let us know if you have not heard from their office within the next week.   If you had any urgent prescriptions sent in today, please check with the pharmacy within an hour of our visit to make sure the prescription was transmitted appropriately.   Please try these tips to maintain a healthy lifestyle:  Eat at least 3 REAL meals and 1-2 snacks per day.  Aim for no more than 5 hours between eating.  If you eat breakfast, please do so within one hour of getting up.   Each meal should contain half fruits/vegetables, one quarter protein, and one quarter carbs (no bigger than a computer mouse)  Cut down on sweet beverages. This includes juice, soda, and sweet tea.   Drink at least 1 glass of water with each meal and aim for at least 8 glasses per day  Exercise at least 150 minutes every week.

## 2023-05-20 NOTE — Assessment & Plan Note (Signed)
Blood pressure at goal today on Coreg 3.125 mg twice daily and spironolactone 25 mg daily.

## 2023-05-20 NOTE — Assessment & Plan Note (Signed)
Will refill diabetic supplies.  Recent A1c 8.1.  Too early to recheck today.  He will continue Lantus 44 units daily and he will continue taking NovoLog as needed for sugars greater than 200.  He will come back next month and we can recheck A1c at that time.

## 2023-05-20 NOTE — Assessment & Plan Note (Signed)
Much improved with over-the-counter Benadryl.  He is also taking Astelin.  It is okay for him to continue taking this as needed.  He will let us know if symptoms worsen.

## 2023-05-20 NOTE — Progress Notes (Signed)
   Jeffrey [name] is a 67 y.o. male who presents today for an office visit.  Assessment/Plan:  Chronic Problems Addressed Today: Rhinitis Much improved with over-the-counter Benadryl.  He is also taking Astelin.  It is okay for him to continue taking this as needed.  He will let us know if symptoms worsen.  [name] Will refill diabetic supplies.  Recent A1c 8.1.  Too early to recheck today.  He will continue Lantus 44 units daily and he will continue taking NovoLog as needed for sugars greater than 200.  He will come back next month and we can recheck A1c at that time.  Hypertension associated with diabetes ([name]) Blood pressure at goal today on Coreg 3.125 mg twice daily and spironolactone 25 mg daily.    Subjective:  HPI:  See A/P for status of chronic conditions.  Patient is here today to discuss nasal drainage and excess saliva.  He has been having some excess saliva production for the last year or two. Talked to the pharmacist who recommended OTC benadryl. This has been helping. Thinks the symptoms may have started after getting a dental cleaning a couple.        Objective:  Physical Exam: BP 124/61   Pulse 73   Temp 98 F (36.7 C) (Temporal)   Ht 5\' 7"  (1.702 m)   Wt 172 lb (78 kg)   SpO2 97%   BMI 26.94 kg/m   Gen: No acute distress, resting comfortably Neuro: Grossly normal, moves all extremities Psych: Normal affect and thought content      Celita Aron M. [name], MD 05/20/2023 8:05 AM

## 2023-05-26 ENCOUNTER — Other Ambulatory Visit: Payer: Self-pay | Admitting: Family Medicine

## 2023-05-26 ENCOUNTER — Other Ambulatory Visit: Payer: Self-pay | Admitting: *Deleted

## 2023-05-26 MED ORDER — DEXCOM G7 SENSOR MISC: 0 refills | Status: DC

## 2023-05-26 MED ORDER — DEXCOM G7 RECEIVER DEVI: 3 refills | Status: DC

## 2023-05-28 ENCOUNTER — Other Ambulatory Visit: Payer: Self-pay | Admitting: Family Medicine

## 2023-06-10 ENCOUNTER — Encounter: Payer: Self-pay | Admitting: Family Medicine

## 2023-06-10 ENCOUNTER — Telehealth: Payer: Self-pay | Admitting: Family Medicine

## 2023-06-10 NOTE — Telephone Encounter (Signed)
Patient called requesting to have Francena Hanly give him a callback. I asked patient what it was in regards to but he wasn't comfortable to share further details.

## 2023-06-11 ENCOUNTER — Encounter: Payer: Self-pay | Admitting: Family Medicine

## 2023-06-11 ENCOUNTER — Telehealth: Payer: PPO | Admitting: Family Medicine

## 2023-06-11 VITALS — Ht 67.0 in | Wt 172.0 lb

## 2023-06-11 DIAGNOSIS — E785 Hyperlipidemia, unspecified: Secondary | ICD-10-CM

## 2023-06-11 DIAGNOSIS — N184 Chronic kidney disease, stage 4 (severe): Secondary | ICD-10-CM

## 2023-06-11 DIAGNOSIS — E1169 Type 2 diabetes mellitus with other specified complication: Secondary | ICD-10-CM

## 2023-06-11 DIAGNOSIS — U071 COVID-19: Secondary | ICD-10-CM

## 2023-06-11 MED ORDER — ONDANSETRON HCL 4 MG PO TABS: 4.0000 mg | ORAL_TABLET | Freq: Three times a day (TID) | ORAL | 0 refills | Status: DC | PRN

## 2023-06-11 MED ORDER — BENZONATATE 200 MG PO CAPS: 200.0000 mg | ORAL_CAPSULE | Freq: Two times a day (BID) | ORAL | 0 refills | Status: DC | PRN

## 2023-06-11 MED ORDER — MOLNUPIRAVIR 200 MG PO CAPS: 4.0000 | ORAL_CAPSULE | Freq: Two times a day (BID) | ORAL | 0 refills | Status: AC

## 2023-06-11 NOTE — Telephone Encounter (Signed)
Patient had OV today with PCP  

## 2023-06-11 NOTE — Telephone Encounter (Signed)
Patient has OV today with PCP  

## 2023-06-11 NOTE — Telephone Encounter (Signed)
Patient has OV with PCP today  

## 2023-06-11 NOTE — Progress Notes (Addendum)
   Jeffrey [name] is a 67 y.o. male who presents today for a virtual office visit.  Assessment/Plan:  New/Acute Problems: COVID He is at increased risk for complication from COVID due to his comorbidities.  He is probably dehydrated.  Cannot rule out pneumonia or systemic illness. We did discuss limitations of virtual visit and inability to perform physical exam.  We did discuss having him go to the ED for further evaluation due for the progression of his symptoms however he is hesitant to do this. He would like to try home medications first.  We discussed risks of outpatient management however he elected to proceed with this.  He has capacity to make this decision.  We will avoid Paxlovid given his CKD and interactions with current medications.  Will start molnupiravir.  Encouraged hydration.  Also start Tessalon and Zofran for symptomatic control as well.  He will follow-up with [place] tomorrow though ,we did reiterate reasons to seek emergent care.  Chronic Problems Addressed Today: Dyslipidemia On Lipitor 80 mg daily.  Avoid paxlovid as above  CKD Following with nephrology.  His recent GFR's have been stable in the 25-30 range.  Will avoid paxlovid and start molnupiravir as above.    Subjective:  HPI:  Patient here with COVID. Symptoms started about 5 days ago. Home COVID test was positive. He has had significant issues with cough and congestion.  Feeling very weak.  A lot of fatigue.  Decreased appetite.  He is staying up all night coughing.  Not taking much liquids due to fatigue and not feeling well.  He is having some nausea with eating.  No reported chest pain or shortness of breath.  No reported fevers or chills.  Patient states he does not feel well. Brother has been sick with COVID as well.        Objective/Observations  Physical Exam: Gen: NAD, resting comfortably, frequently coughing Pulm: Normal work of breathing Psych: Normal affect and thought content  Virtual Visit via  Video   I connected with Jeffrey [name] on 06/11/23 at  1:00 PM EDT by a video enabled telemedicine application and verified that I am speaking with the correct person using two identifiers. The limitations of evaluation and management by telemedicine and the availability of in person appointments were discussed. The patient expressed understanding and agreed to proceed.   Patient location: Home Provider location: [place] [employer] Persons participating in the virtual visit: Myself and Patient and his brother     Jeffrey [name]. Jeffrey Ralph, MD 06/11/2023 1:44 PM

## 2023-06-12 ENCOUNTER — Telehealth: Payer: Self-pay | Admitting: *Deleted

## 2023-06-12 ENCOUNTER — Telehealth: Payer: PPO | Admitting: Family Medicine

## 2023-06-12 NOTE — Telephone Encounter (Signed)
Spoke to pt and brother, told him calling about My Chart message that [facility] sent. Pt and Chrissie Noa said the pain has subsided and thanked me for calling and to thank Dr. [name] for sending medication in for them. Told them okay, hope you both feel better.

## 2023-06-15 ENCOUNTER — Other Ambulatory Visit: Payer: Self-pay | Admitting: *Deleted

## 2023-06-15 ENCOUNTER — Telehealth: Payer: Self-pay | Admitting: *Deleted

## 2023-06-15 MED ORDER — COVID-19 AT HOME ANTIGEN TEST VI KIT: PACK | 0 refills | Status: DC

## 2023-06-15 NOTE — Telephone Encounter (Signed)
Spoke with patient's brother, stated feeling better today  Still with productive cough will continue with medication, has a F/U visit on 06/25/2023 Advise if [facility] continue need to go to ED, verbalized understanding

## 2023-06-17 ENCOUNTER — Telehealth: Payer: Self-pay | Admitting: *Deleted

## 2023-06-17 NOTE — Telephone Encounter (Signed)
Patent brother stated been taking Covid medication wrong  Feeling slightly better  Please advise   Per patient Brother   this is how we took the lagevrio day 1 4:00 1pill              9:00 1pill day 2 8:00 1pill              12:30 1pill               5:45 1pill               9:001pill day 3 8:001pill              12:30 1 pill               4:15 1pill               9:001pill day4 9:001pill            1:00 1pill            4;15 1pill              9:00 1 pill day 5 9;10 1 pill              1:00 1pill              4:30 I pill                9:00 1 pill day 6 8:04 1 pill              11:00 1 pill                  6:00  4 pills. this is when I realized I wasn't following the doses which should of been 4 pills am 4 pills evening can  Jeffrey [name] call us I hope this was ok I start the remaining pills  4       pills am 4 pills evening. I hope that we are still getting the cure.

## 2023-06-17 NOTE — Telephone Encounter (Signed)
Patient notified, verbalize understanding

## 2023-06-17 NOTE — Telephone Encounter (Signed)
They should complete remainder of prescription per directions and let us know if not improving.   [name]. [name], MD 06/17/2023 9:47 AM

## 2023-06-22 ENCOUNTER — Encounter: Payer: Self-pay | Admitting: Family Medicine

## 2023-06-23 NOTE — Telephone Encounter (Signed)
Please advice  

## 2023-06-23 NOTE — Telephone Encounter (Signed)
Ok to [employer].  Jeffrey [name]. [name], MD 06/23/2023 8:26 AM

## 2023-06-24 ENCOUNTER — Other Ambulatory Visit: Payer: Self-pay | Admitting: *Deleted

## 2023-06-24 DIAGNOSIS — Z125 Encounter for screening for malignant neoplasm of prostate: Secondary | ICD-10-CM

## 2023-06-24 DIAGNOSIS — N184 Chronic kidney disease, stage 4 (severe): Secondary | ICD-10-CM

## 2023-06-24 NOTE — Telephone Encounter (Signed)
Order placed

## 2023-06-24 NOTE — Telephone Encounter (Signed)
LVM to give [place] a call to schedule an OV with the lab

## 2023-06-25 ENCOUNTER — Ambulatory Visit: Payer: PPO | Admitting: Family Medicine

## 2023-06-25 ENCOUNTER — Encounter: Payer: Self-pay | Admitting: Family Medicine

## 2023-06-25 VITALS — BP 107/65 | HR 78 | Temp 97.1°F | Ht 67.0 in | Wt 168.8 lb

## 2023-06-25 DIAGNOSIS — E1142 Type 2 diabetes mellitus with diabetic polyneuropathy: Secondary | ICD-10-CM | POA: Diagnosis not present

## 2023-06-25 DIAGNOSIS — N184 Chronic kidney disease, stage 4 (severe): Secondary | ICD-10-CM | POA: Diagnosis not present

## 2023-06-25 DIAGNOSIS — I152 Hypertension secondary to endocrine disorders: Secondary | ICD-10-CM | POA: Diagnosis not present

## 2023-06-25 DIAGNOSIS — R5381 Other malaise: Secondary | ICD-10-CM

## 2023-06-25 DIAGNOSIS — Z125 Encounter for screening for malignant neoplasm of prostate: Secondary | ICD-10-CM | POA: Diagnosis not present

## 2023-06-25 DIAGNOSIS — Z794 Long term (current) use of insulin: Secondary | ICD-10-CM

## 2023-06-25 DIAGNOSIS — E1159 Type 2 diabetes mellitus with other circulatory complications: Secondary | ICD-10-CM

## 2023-06-25 LAB — BASIC METABOLIC PANEL
BUN: 47 mg/dL — ABNORMAL HIGH (ref 6–23)
CO2: 25 meq/L (ref 19–32)
Calcium: 9.5 mg/dL (ref 8.4–10.5)
Chloride: 103 meq/L (ref 96–112)
Creatinine, Ser: 2.19 mg/dL — ABNORMAL HIGH (ref 0.40–1.50)
GFR: 30.17 mL/min — ABNORMAL LOW (ref >60.00)
Glucose, Bld: 199 mg/dL — ABNORMAL HIGH (ref 70–99)
Potassium: 5.7 meq/L — ABNORMAL HIGH (ref 3.5–5.1)
Sodium: 138 meq/L (ref 135–145)

## 2023-06-25 LAB — PSA: PSA: 3.93 ng/mL (ref 0.10–4.00)

## 2023-06-25 LAB — POCT GLYCOSYLATED HEMOGLOBIN (HGB A1C): Hemoglobin A1C: 7.6 % — AB (ref 4.0–5.6)

## 2023-06-25 NOTE — Patient Instructions (Addendum)
It was very nice to see you today!  Your A1c is 7.6.  Keep up the good work!  No medication changes today.  We will see you back in 3 months.  Come back sooner if needed.  Return in about 3 months (around 09/25/2023).   Take care, Dr [name]  PLEASE NOTE:  If you had any lab tests, please let us know if you have not heard back within a few days. You may see your results on mychart before we have a chance to review them but we will give you a call once they are reviewed by [place].   If we ordered any referrals today, please let us know if you have not heard from their office within the next week.   If you had any urgent prescriptions sent in today, please check with the pharmacy within an hour of our visit to make sure the prescription was transmitted appropriately.   Please try these tips to maintain a healthy lifestyle:  Eat at least 3 REAL meals and 1-2 snacks per day.  Aim for no more than 5 hours between eating.  If you eat breakfast, please do so within one hour of getting up.   Each meal should contain half fruits/vegetables, one quarter protein, and one quarter carbs (no bigger than a computer mouse)  Cut down on sweet beverages. This includes juice, soda, and sweet tea.   Drink at least 1 glass of water with each meal and aim for at least 8 glasses per day  Exercise at least 150 minutes every week.

## 2023-06-25 NOTE — Assessment & Plan Note (Signed)
Check be met.  Follows with nephrology.

## 2023-06-25 NOTE — Assessment & Plan Note (Signed)
He has had improvement in the past with PT.  They are currently looking at another trial of physical therapy.  They will let us know if he needs referral.  Deferred referral for today.

## 2023-06-25 NOTE — Progress Notes (Signed)
   Jeffrey [name] is a 67 y.o. male who presents today for an office visit.  Assessment/Plan:  New/Acute Problems: Covid Recovered.   Chronic Problems Addressed Today: [name] A1c improved to 7.6.  He is having a few readings in the 60s in the evening but this is manageable.  Will continue his current regimen Lantus 44 units daily and NovoLog as needed for sugars greater than 200.  He will continue to work on diet and exercise.  Recheck A1c in 3 months.  Hypertension associated with diabetes ([name]) Blood pressure is at goal today on Coreg 3.125 mg twice daily and spironolactone 25 mg daily.  Physical debility He has had improvement in the past with PT.  They are currently looking at another trial of physical therapy.  They will let us know if he needs referral.  Deferred referral for today.  CKD (chronic kidney disease) stage 4, GFR 15-29 ml/min ([name]) Check be met.  Follows with nephrology.  Preventative health care -they will get RSV vaccine at the pharmacy.  Up-to-date on other vaccines and labs.    Subjective:  HPI:  See A/P for status of chronic conditions.  Patient is here today for follow-up.  He did have COVID a few weeks ago.  We saw him via virtual visit and started him on molnupiravir.  He has recovered from this and is doing well today.  His main concern is his diabetes. He is taking 44 units of lantus daily. He has very rarely needed to use his novolog.  He is occasionally getting low readings at night in the 60s.  This usually resolves after eating or drinking.  Home sugars have been averaging about 145.  Tolerating medications well.  He is here with his brother today.  They are currently looking into a different rehab facility.  They do not wish for referral today but will let us know if he does need 1.       Objective:  Physical Exam: BP 107/65   Pulse 78   Temp (!) 97.1 F (36.2 C) (Temporal)   Ht 5\' 7"  (1.702 m)   Wt 168 lb 12.8  oz (76.6 kg)   SpO2 97%   BMI 26.44 kg/m   Gen: No acute distress, resting comfortably Neuro: Grossly normal, moves all extremities Psych: Normal affect and thought content      Trayven Lumadue M. [name], MD 06/25/2023 10:32 AM

## 2023-06-25 NOTE — Assessment & Plan Note (Signed)
A1c improved to 7.6.  He is having a few readings in the 60s in the evening but this is manageable.  Will continue his current regimen Lantus 44 units daily and NovoLog as needed for sugars greater than 200.  He will continue to work on diet and exercise.  Recheck A1c in 3 months.

## 2023-06-25 NOTE — Assessment & Plan Note (Signed)
Blood pressure is at goal today on Coreg 3.125 mg twice daily and spironolactone 25 mg daily.

## 2023-06-26 ENCOUNTER — Other Ambulatory Visit: Payer: Self-pay | Admitting: *Deleted

## 2023-06-26 ENCOUNTER — Encounter: Payer: Self-pay | Admitting: Family Medicine

## 2023-06-26 DIAGNOSIS — R5381 Other malaise: Secondary | ICD-10-CM

## 2023-06-26 NOTE — Telephone Encounter (Signed)
Spoke with patient brother, notified referral placed in this morning  Verbalized understanding

## 2023-06-26 NOTE — Telephone Encounter (Signed)
Referral placed.

## 2023-06-29 NOTE — Progress Notes (Signed)
Potassium is elevated.  This may be a lab error.  Recommend he go to the [facility] office to have this rechecked.  His kidney function is better than last time that we checked.  PSA is at goal.  We can recheck this again in 6 to 12 months.

## 2023-06-30 ENCOUNTER — Other Ambulatory Visit: Payer: Self-pay | Admitting: *Deleted

## 2023-06-30 DIAGNOSIS — E875 Hyperkalemia: Secondary | ICD-10-CM

## 2023-07-01 NOTE — Therapy (Signed)
OUTPATIENT PHYSICAL THERAPY LOWER EXTREMITY EVALUATION   Patient Name: Sylar Foxx MRN: [id number] DOB:1955-12-30, 67 y.o., male Today's Date: 07/02/2023  END OF SESSION:  PT End of Session - 07/02/23 0759     Visit Number 1    Date for PT Re-Evaluation 08/27/23    Authorization Type HTA    Progress Note Due on Visit 10    PT Start Time 0800    PT Stop Time [phone]    PT Time Calculation (min) 42 min    Activity Tolerance Patient tolerated treatment well    Behavior During Therapy [facility] for tasks assessed/performed             Past Medical History:  Diagnosis Date   Cardiac arrest ([name]) 05/22/2016   Cataract    CHF (congestive heart failure) ([name])    CKD (chronic kidney disease) stage 3, GFR 30-59 ml/min ([name]) 05/28/2021   CKD (chronic kidney disease) stage 4, GFR 15-29 ml/min ([name]) 05/28/2021   Diabetes mellitus without complication ([name])    type 2   Diabetic retinopathy ([name])    Hyperlipidemia    Hypertension    Memory loss    mild   Myocardial infarct ([name]) 2105   Retinopathy    Both eyes   S/P primary angioplasty with coronary stent 05/22/2016   Vitamin B12 deficiency    Vitreous hemorrhage of left eye ([name])    proliferative diabetic retinopathy   Past Surgical History:  Procedure Laterality Date   ANGIOPLASTY     2015   COLONOSCOPY     multiple   EYE SURGERY     PARS PLANA VITRECTOMY Left 03/15/2020   Procedure: PARS PLANA VITRECTOMY WITH 25 GAUGE;  Surgeon: Stephannie Li, MD;  Location: [facility] OR;  Service: Ophthalmology;  Laterality: Left;   PHOTOCOAGULATION WITH LASER Left 03/15/2020   Procedure: PHOTOCOAGULATION WITH LASER; INTRAVITREAL INJECTION OF AVASTIN;  Surgeon: Stephannie Li, MD;  Location: [facility] OR;  Service: Ophthalmology;  Laterality: Left;   REFRACTIVE SURGERY     UPPER GI ENDOSCOPY     several   VITRECTOMY     Patient Active Problem List   Diagnosis Date Noted   Rhinitis 05/20/2023   Cervical spondylosis 08/26/2022   CKD (chronic kidney  disease) stage 4, GFR 15-29 ml/min ([name]) 05/28/2021   Adjustment disorder 01/22/2021   Chronic back pain 10/22/2020   Peripheral vertigo 05/15/2020   Dermatitis 01/04/2020   Unintentional weight loss with loose stools 05/19/2019   Low vitamin B12 level 03/29/2019   Heme positive stool 03/14/2019   Normocytic anemia 03/14/2019   Chronic diastolic heart failure ([name]) 02/08/2019   Diabetic retinopathy ([name]) 09/08/2018   Physical debility 05/24/2018   Varicose veins of both lower extremities 03/01/2018   Fatigue due to exertion 10/14/2017   GERD (gastroesophageal reflux disease) 08/25/2017   CAD in native artery 04/14/2017   Hyperlipidemia associated with type 2 diabetes mellitus ([name]) 04/14/2017   Diabetic peripheral neuropathy associated with type 2 diabetes mellitus ([name]) 08/17/2016   [name] 05/02/2016   Hypertension associated with diabetes ([name]) 05/02/2016    PCP: Ardith Dark, MD   REFERRING PROVIDER: Ardith Dark, MD   REFERRING DIAG: R53.81 (ICD-10-CM) - Physical debility   THERAPY DIAG:  Muscle weakness (generalized)  Other abnormalities of gait and mobility  Difficulty in walking, not elsewhere classified  Rationale for Evaluation and Treatment: Rehabilitation  ONSET DATE: 2022  SUBJECTIVE:   SUBJECTIVE STATEMENT:  Patient has been to two  different therapy places and not had good experiences. One at [facility], one private practice. I want to be able to walk without the walker and I'm having trouble getting out of chairs without the walker. Presently sits in a dining chair. Can't sit in his recliner in his bedroom anymore. Wants to work on balance also. Joined club [employer] which has a track and an elevator (on pause right now due to disability). Periodically legs shake when he is walking.   Brother: [name]   PERTINENT HISTORY: Compression fx L1 2022, CAD, CKD (stage 4), DM, cardiac arrest 2017, neuropathy, retinopathy Long history  of gait abnormality beginning back in 2022. He has a history of CAD and believes that his debility began following a covid infection. In December odf 2022 he fell and suffered a compression fracture of L1.  PAIN:  Are you having pain? No  PRECAUTIONS: Fall  RED FLAGS: None   WEIGHT BEARING RESTRICTIONS: No  FALLS:  Has patient fallen in last 6 months? No and with sit to stand falls back sometimes  LIVING ENVIRONMENT: Lives with: lives with their family Lives in: House/apartment Stairs: No Has following equipment at home: Single point cane, Environmental consultant - 2 wheeled, Environmental consultant - 4 wheeled, shower chair, Grab bars, and stand up walker also; also has shower chair at sink for shaving, brushing teeth etc, also has transporter.  OCCUPATION: retired  PLOF: Independent with household mobility with device  PATIENT GOALS: to walk without a walker  NEXT MD VISIT: 09/28/22  OBJECTIVE:  Note: Objective measures were completed at Evaluation unless otherwise noted.  DIAGNOSTIC FINDINGS: N/A  COGNITION: Overall cognitive status: Within functional limits for tasks assessed     SENSATION: Neuropathy in B feet   MUSCLE LENGTH: Marked HS R>L, hip flexor tightness   POSTURE: rounded shoulders and forward head  LOWER EXTREMITY ROM: WFL for tasks assessed  LOWER EXTREMITY MMT: *tested in hooklying  MMT Right eval Left eval  Hip flexion 4 4  Hip extension Able to bridge Able to bridge  Hip abduction 4-* 4-*  Hip adduction    Hip internal rotation    Hip external rotation    Knee flexion    Knee extension 4 4+  Ankle dorsiflexion 4+ 4-  Ankle plantarflexion    Ankle inversion    Ankle eversion     (Blank rows = not tested)   FUNCTIONAL TESTS:  30 seconds chair stand test Timed up and go (TUG): 59.36 with 4WW Stance time: 45 sec patient reports unsteadiness and feels he goes backwards  GAIT: Distance walked: 25 Assistive device utilized: Walker - 4 wheeled Level of assistance:  Modified independence Comments: slow speed, decreased step and stride, decreased heel strike, forward trunk lean.  TRANSFERS: Sit to stand: Uses walker to pull up; stand to sit: does not fully back up to chair and does not reach back; Supine to sit: needs min A for log roll  (has some back pain with sit <>supine due to sitting upward from supine) also fear of falling from bed, sit to supine needs min A to lift legs on to mat table  TODAY'S TREATMENT:  DATE:   07/02/23 See pt ed and HEP  Transfers: cues to push up from chair versus using walker to pull up, back up fully to chair and reach back before sitting, Use log roll for supine to sit (fearful).  PATIENT EDUCATION:  Education details: PT eval findings, anticipated POC, initial HEP, postural awareness, and transfer safety  Person educated: Patient and brother Education method: Explanation, Demonstration, and Handouts Education comprehension: verbalized understanding and returned demonstration  HOME EXERCISE PROGRAM: Access Code: [email or web address] URL: [email or web address] Date: 07/02/2023 Prepared by: Raynelle Fanning  Exercises - Supine Bridge  - 2 x daily - 7 x weekly - 1-3 sets - 10 reps - Sit to Stand with Counter Support  - 3 x daily - 3-4 x weekly - 1 sets - 1-5 reps - Hooklying Clamshell with Resistance  - 2 x daily - 3-4 x weekly - 1 sets - 10 reps  ASSESSMENT:  CLINICAL IMPRESSION: Patient is a 67 y.o. male who was seen today for physical therapy evaluation and treatment for physical debility. He has an extensive PMH that is contributing to his weakness, gait difficulties and balance. His TUG and inability to sit to stand without UE assist indicate he is a fall risk. He needs min A x 1 with sit <> supine transfers. He reports feelings of unsteadiness in standing upright which is likely do to his forward  posture in his walker since he has used for two years. His goal is to get back to walking without AD. He will benefit from skilled PT to address these deficits.    OBJECTIVE IMPAIRMENTS: Abnormal gait, decreased balance, decreased ROM, decreased strength, decreased safety awareness, dizziness, impaired flexibility, impaired sensation, postural dysfunction, and pain.   ACTIVITY LIMITATIONS: carrying, lifting, bending, standing, squatting, stairs, transfers, bed mobility, continence, bathing, and locomotion level  PARTICIPATION LIMITATIONS: shopping and community activity  PERSONAL FACTORS: Age, Fitness, Time since onset of injury/illness/exacerbation, and 3+ comorbidities: Compression fx L1 2022, CAD, CKD (stage 4), neuropathy, retinopathy  are also affecting patient's functional outcome.   REHAB POTENTIAL: Good  CLINICAL DECISION MAKING: Unstable/unpredictable  EVALUATION COMPLEXITY: High   GOALS: Goals reviewed with patient? Yes  SHORT TERM GOALS: Target date: 07/30/23 Improved TUG to < 25 sec to decrease fall risk.  Baseline: Goal status: INITIAL  2.  Able to perform 5XSTS without UE assist and controlled descent showing improved LE strength.  Baseline:  Goal status: INITIAL  3.  Patient will be able to stand safely for 2 min without UE assist Baseline:  Goal status: INITIAL   LONG TERM GOALS: Target date: 08/27/23  Ind with advanced HEP Baseline:  Goal status: INITIAL  2. Patient will be able to ambulate 600' with LRAD with good safety to access community.  Baseline:  Goal status: INITIAL  3.  Patient able to perform bed mobility with SBA Baseline:  Goal status: INITIAL  4.  Improved 30 sec chair test to 5 reps  Baseline:  Goal status: INITIAL  5.  Patient able to get in and out of his recliner at home with good control and no LOB Baseline:  Goal status: INITIAL    PLAN:  PT FREQUENCY: 3x/week  PT DURATION: 8 weeks  PLANNED INTERVENTIONS: [id number]- PT  Re-evaluation, 97110-Therapeutic exercises, 97530- Therapeutic activity, 97112- Neuromuscular re-education, 97535- Self Care, [id number]- Manual therapy, [phone]- Gait training, [phone]- Aquatic Therapy, 97014- Electrical stimulation (unattended), Patient/Family education, Balance training, Stair training, Taping, Joint mobilization, Spinal mobilization, DME instructions, Cryotherapy, and Moist heat  PLAN FOR  NEXT SESSION: general strengthening, work on sit <> supine transfers, sit to stand, standing tolerance, balance, gait   Solon Palm, PT  07/02/2023, 5:45 PM

## 2023-07-02 ENCOUNTER — Encounter: Payer: Self-pay | Admitting: Physical Therapy

## 2023-07-02 ENCOUNTER — Ambulatory Visit: Payer: PPO | Attending: Family Medicine | Admitting: Physical Therapy

## 2023-07-02 ENCOUNTER — Other Ambulatory Visit: Payer: Self-pay

## 2023-07-02 DIAGNOSIS — R5381 Other malaise: Secondary | ICD-10-CM | POA: Insufficient documentation

## 2023-07-02 DIAGNOSIS — R2689 Other abnormalities of gait and mobility: Secondary | ICD-10-CM | POA: Diagnosis not present

## 2023-07-02 DIAGNOSIS — R262 Difficulty in walking, not elsewhere classified: Secondary | ICD-10-CM | POA: Insufficient documentation

## 2023-07-02 DIAGNOSIS — M6281 Muscle weakness (generalized): Secondary | ICD-10-CM | POA: Insufficient documentation

## 2023-07-07 ENCOUNTER — Encounter: Payer: Self-pay | Admitting: Physical Therapy

## 2023-07-07 ENCOUNTER — Ambulatory Visit: Payer: PPO | Admitting: Physical Therapy

## 2023-07-07 DIAGNOSIS — R262 Difficulty in walking, not elsewhere classified: Secondary | ICD-10-CM

## 2023-07-07 DIAGNOSIS — M6281 Muscle weakness (generalized): Secondary | ICD-10-CM | POA: Diagnosis not present

## 2023-07-07 DIAGNOSIS — R2689 Other abnormalities of gait and mobility: Secondary | ICD-10-CM

## 2023-07-07 NOTE — Therapy (Signed)
OUTPATIENT PHYSICAL THERAPY LOWER EXTREMITY TREATMENT   Patient Name: Jeffrey [name] MRN: [id number] DOB:1955/08/23, 67 y.o., male Today's Date: 07/07/2023  END OF SESSION:  PT End of Session - 07/07/23 1357     Visit Number 2    Date for PT Re-Evaluation 08/27/23    Authorization Type HTA    Progress Note Due on Visit 10    PT Start Time 1231    PT Stop Time 1315    PT Time Calculation (min) 44 min    Equipment Utilized During Treatment Gait belt    Activity Tolerance Patient tolerated treatment well    Behavior During Therapy WFL for tasks assessed/performed              Past Medical History:  Diagnosis Date   Cardiac arrest ([name]) 05/22/2016   Cataract    CHF (congestive heart failure) ([name])    CKD (chronic kidney disease) stage 3, GFR 30-59 ml/min ([name]) 05/28/2021   CKD (chronic kidney disease) stage 4, GFR 15-29 ml/min ([name]) 05/28/2021   Diabetes mellitus without complication ([name])    type 2   Diabetic retinopathy ([name])    Hyperlipidemia    Hypertension    Memory loss    mild   Myocardial infarct ([name]) 2105   Retinopathy    Both eyes   S/P primary angioplasty with coronary stent 05/22/2016   Vitamin B12 deficiency    Vitreous hemorrhage of left eye ([name])    proliferative diabetic retinopathy   Past Surgical History:  Procedure Laterality Date   ANGIOPLASTY     2015   COLONOSCOPY     multiple   EYE SURGERY     PARS PLANA VITRECTOMY Left 03/15/2020   Procedure: PARS PLANA VITRECTOMY WITH 25 GAUGE;  Surgeon: Stephannie Li, MD;  Location: [facility] OR;  Service: Ophthalmology;  Laterality: Left;   PHOTOCOAGULATION WITH LASER Left 03/15/2020   Procedure: PHOTOCOAGULATION WITH LASER; INTRAVITREAL INJECTION OF AVASTIN;  Surgeon: Stephannie Li, MD;  Location: [facility] OR;  Service: Ophthalmology;  Laterality: Left;   REFRACTIVE SURGERY     UPPER GI ENDOSCOPY     several   VITRECTOMY     Patient Active Problem List   Diagnosis Date Noted   Rhinitis 05/20/2023    Cervical spondylosis 08/26/2022   CKD (chronic kidney disease) stage 4, GFR 15-29 ml/min ([name]) 05/28/2021   Adjustment disorder 01/22/2021   Chronic back pain 10/22/2020   Peripheral vertigo 05/15/2020   Dermatitis 01/04/2020   Unintentional weight loss with loose stools 05/19/2019   Low vitamin B12 level 03/29/2019   Heme positive stool 03/14/2019   Normocytic anemia 03/14/2019   Chronic diastolic heart failure ([name]) 02/08/2019   Diabetic retinopathy ([name]) 09/08/2018   Physical debility 05/24/2018   Varicose veins of both lower extremities 03/01/2018   Fatigue due to exertion 10/14/2017   GERD (gastroesophageal reflux disease) 08/25/2017   CAD in native artery 04/14/2017   Hyperlipidemia associated with type 2 diabetes mellitus ([name]) 04/14/2017   Diabetic peripheral neuropathy associated with type 2 diabetes mellitus ([name]) 08/17/2016   [name] 05/02/2016   Hypertension associated with diabetes ([name]) 05/02/2016    PCP: Ardith Dark, MD   REFERRING PROVIDER: Ardith Dark, MD   REFERRING DIAG: R53.81 (ICD-10-CM) - Physical debility   THERAPY DIAG:  Muscle weakness (generalized)  Other abnormalities of gait and mobility  Difficulty in walking, not elsewhere classified  Rationale for Evaluation and Treatment: Rehabilitation  ONSET DATE: 2022  SUBJECTIVE:  SUBJECTIVE STATEMENT: Patient reports he is doing okay today. He has been semi- compliant with HEP. The Bridging exercise is hard for him.    From eval: Patient has been to two different therapy places and not had good experiences. One at [facility], one private practice. I want to be able to walk without the walker and I'm having trouble getting out of chairs without the walker. Presently sits in a dining chair. Can't sit in his recliner in his bedroom anymore. Wants to work on balance also. Joined club [employer] which has a track and an elevator (on pause right now due to disability).  Periodically legs shake when he is walking.   Brother: [name]   PERTINENT HISTORY: Compression fx L1 2022, CAD, CKD (stage 4), DM, cardiac arrest 2017, neuropathy, retinopathy Long history of gait abnormality beginning back in 2022. He has a history of CAD and believes that his debility began following a covid infection. In December odf 2022 he fell and suffered a compression fracture of L1.  PAIN:  Are you having pain? No  PRECAUTIONS: Fall  RED FLAGS: None   WEIGHT BEARING RESTRICTIONS: No  FALLS:  Has patient fallen in last 6 months? No and with sit to stand falls back sometimes  LIVING ENVIRONMENT: Lives with: lives with their family Lives in: House/apartment Stairs: No Has following equipment at home: Single point cane, Environmental consultant - 2 wheeled, Environmental consultant - 4 wheeled, shower chair, Grab bars, and stand up walker also; also has shower chair at sink for shaving, brushing teeth etc, also has transporter.  OCCUPATION: retired  PLOF: Independent with household mobility with device  PATIENT GOALS: to walk without a walker  NEXT MD VISIT: 09/28/22  OBJECTIVE:  Note: Objective measures were completed at Evaluation unless otherwise noted.  DIAGNOSTIC FINDINGS: N/A  COGNITION: Overall cognitive status: Within functional limits for tasks assessed     SENSATION: Neuropathy in B feet   MUSCLE LENGTH: Marked HS R>L, hip flexor tightness   POSTURE: rounded shoulders and forward head  LOWER EXTREMITY ROM: WFL for tasks assessed  LOWER EXTREMITY MMT: *tested in hooklying  MMT Right eval Left eval  Hip flexion 4 4  Hip extension Able to bridge Able to bridge  Hip abduction 4-* 4-*  Hip adduction    Hip internal rotation    Hip external rotation    Knee flexion    Knee extension 4 4+  Ankle dorsiflexion 4+ 4-  Ankle plantarflexion    Ankle inversion    Ankle eversion     (Blank rows = not tested)   FUNCTIONAL TESTS:  30 seconds chair stand test Timed up and go (TUG):  59.36 with 4WW Stance time: 45 sec patient reports unsteadiness and feels he goes backwards  GAIT: Distance walked: 25 Assistive device utilized: Walker - 4 wheeled Level of assistance: Modified independence Comments: slow speed, decreased step and stride, decreased heel strike, forward trunk lean.  TRANSFERS: Sit to stand: Uses walker to pull up; stand to sit: does not fully back up to chair and does not reach back; Supine to sit: needs min A for log roll  (has some back pain with sit <>supine due to sitting upward from supine) also fear of falling from bed, sit to supine needs min A to lift legs on to mat table  TODAY'S TREATMENT:  DATE:    07/07/2023 NuStep Level 1 5 minutes- PT present to discuss status Supine Bridging x 12- max verbal and tactile cues Hooklying hip abduction x20 Hooklying Hip adduction x 20 Seated LAQ 2# AW 2 x 10 each (harder on Rt compared to Lt) Seated Marching 2# AW 2 x 10 each Standing heel raises x 10 6 inch Step taps x 20 bilateral UE suppor SBA- CGA Sit to stands with UE support x 7  07/02/23 See pt ed and HEP  Transfers: cues to push up from chair versus using walker to pull up, back up fully to chair and reach back before sitting, Use log roll for supine to sit (fearful).  PATIENT EDUCATION:  Education details: PT eval findings, anticipated POC, initial HEP, postural awareness, and transfer safety  Person educated: Patient and brother Education method: Explanation, Demonstration, and Handouts Education comprehension: verbalized understanding and returned demonstration  HOME EXERCISE PROGRAM: Access Code: [email or web address] URL: [email or web address] Date: 07/02/2023 Prepared by: Raynelle Fanning  Exercises - Supine Bridge  - 2 x daily - 7 x weekly - 1-3 sets - 10 reps - Sit to Stand with Counter Support  - 3 x daily - 3-4 x  weekly - 1 sets - 1-5 reps - Hooklying Clamshell with Resistance  - 2 x daily - 3-4 x weekly - 1 sets - 10 reps  ASSESSMENT:  CLINICAL IMPRESSION: Today's treatment session focused general strengthening. Reviewed HEP with patient and supine bridges are challenging for him. Patient required max verbal and tactile cues for correct performance. Educated patient on correct sequence of sit to stand and stand to sit transfer. Patient demonstrated correct performance. With 6 inch step tap exercise noted posterior lean and patient required SBA- CGA assistance. Patient tolerated treatment session well. Patient will benefit from skilled PT to address the below impairments and improve overall function.   OBJECTIVE IMPAIRMENTS: Abnormal gait, decreased balance, decreased ROM, decreased strength, decreased safety awareness, dizziness, impaired flexibility, impaired sensation, postural dysfunction, and pain.   ACTIVITY LIMITATIONS: carrying, lifting, bending, standing, squatting, stairs, transfers, bed mobility, continence, bathing, and locomotion level  PARTICIPATION LIMITATIONS: shopping and community activity  PERSONAL FACTORS: Age, Fitness, Time since onset of injury/illness/exacerbation, and 3+ comorbidities: Compression fx L1 2022, CAD, CKD (stage 4), neuropathy, retinopathy  are also affecting patient's functional outcome.   REHAB POTENTIAL: Good  CLINICAL DECISION MAKING: Unstable/unpredictable  EVALUATION COMPLEXITY: High   GOALS: Goals reviewed with patient? Yes  SHORT TERM GOALS: Target date: 07/30/23 Improved TUG to < 25 sec to decrease fall risk.  Baseline: Goal status: INITIAL  2.  Able to perform 5XSTS without UE assist and controlled descent showing improved LE strength.  Baseline:  Goal status: INITIAL  3.  Patient will be able to stand safely for 2 min without UE assist Baseline:  Goal status: INITIAL   LONG TERM GOALS: Target date: 08/27/23  Ind with advanced  HEP Baseline:  Goal status: INITIAL  2. Patient will be able to ambulate 600' with LRAD with good safety to access community.  Baseline:  Goal status: INITIAL  3.  Patient able to perform bed mobility with SBA Baseline:  Goal status: INITIAL  4.  Improved 30 sec chair test to 5 reps  Baseline:  Goal status: INITIAL  5.  Patient able to get in and out of his recliner at home with good control and no LOB Baseline:  Goal status: INITIAL    PLAN:  PT FREQUENCY: 3x/week  PT DURATION: 8 weeks  PLANNED  INTERVENTIONS: 97164- PT Re-evaluation, 97110-Therapeutic exercises, 97530- Therapeutic activity, 97112- Neuromuscular re-education, [phone]- Self Care, 09811- Manual therapy, [phone]- Gait training, [phone]- Aquatic Therapy, [phone]- Electrical stimulation (unattended), Patient/Family education, Balance training, Stair training, Taping, Joint mobilization, Spinal mobilization, DME instructions, Cryotherapy, and Moist heat  PLAN FOR NEXT SESSION: continue general strengthening,  standing tolerance, balance,   Claude Manges, PT 07/07/23 1:58 PM

## 2023-07-15 NOTE — Therapy (Signed)
OUTPATIENT PHYSICAL THERAPY LOWER EXTREMITY TREATMENT   Patient Name: Samin Hadsell MRN: [id number] DOB:[birth year], 67 y.o., male Today's Date: 07/16/2023  END OF SESSION:  PT End of Session - 07/16/23 1150     Visit Number 3    Date for PT Re-Evaluation 08/27/23    Authorization Type HTA    Progress Note Due on Visit 10    PT Start Time 1150    PT Stop Time 1232    PT Time Calculation (min) 42 min    Equipment Utilized During Treatment Gait belt    Activity Tolerance Patient tolerated treatment well    Behavior During Therapy WFL for tasks assessed/performed   tearful due to frustration with having to concentrate with sit to stands              Past Medical History:  Diagnosis Date   Cardiac arrest ([name]) 05/22/2016   Cataract    CHF (congestive heart failure) ([name])    CKD (chronic kidney disease) stage 3, GFR 30-59 ml/min ([name]) 05/28/2021   CKD (chronic kidney disease) stage 4, GFR 15-29 ml/min ([name]) 05/28/2021   Diabetes mellitus without complication ([name])    type 2   Diabetic retinopathy ([name])    Hyperlipidemia    Hypertension    Memory loss    mild   Myocardial infarct ([name]) 2105   Retinopathy    Both eyes   S/P primary angioplasty with coronary stent 05/22/2016   Vitamin B12 deficiency    Vitreous hemorrhage of left eye ([name])    proliferative diabetic retinopathy   Past Surgical History:  Procedure Laterality Date   ANGIOPLASTY     2015   COLONOSCOPY     multiple   EYE SURGERY     PARS PLANA VITRECTOMY Left 03/15/2020   Procedure: PARS PLANA VITRECTOMY WITH 25 GAUGE;  Surgeon: Stephannie Li, MD;  Location: [facility] OR;  Service: Ophthalmology;  Laterality: Left;   PHOTOCOAGULATION WITH LASER Left 03/15/2020   Procedure: PHOTOCOAGULATION WITH LASER; INTRAVITREAL INJECTION OF AVASTIN;  Surgeon: Stephannie Li, MD;  Location: [facility] OR;  Service: Ophthalmology;  Laterality: Left;   REFRACTIVE SURGERY     UPPER GI ENDOSCOPY     several   VITRECTOMY     Patient  Active Problem List   Diagnosis Date Noted   Rhinitis 05/20/2023   Cervical spondylosis 08/26/2022   CKD (chronic kidney disease) stage 4, GFR 15-29 ml/min ([name]) 05/28/2021   Adjustment disorder 01/22/2021   Chronic back pain 10/22/2020   Peripheral vertigo 05/15/2020   Dermatitis 01/04/2020   Unintentional weight loss with loose stools 05/19/2019   Low vitamin B12 level 03/29/2019   Heme positive stool 03/14/2019   Normocytic anemia 03/14/2019   Chronic diastolic heart failure ([name]) 02/08/2019   Diabetic retinopathy ([name]) 09/08/2018   Physical debility 05/24/2018   Varicose veins of both lower extremities 03/01/2018   Fatigue due to exertion 10/14/2017   GERD (gastroesophageal reflux disease) 08/25/2017   CAD in native artery 04/14/2017   Hyperlipidemia associated with type 2 diabetes mellitus ([name]) 04/14/2017   Diabetic peripheral neuropathy associated with type 2 diabetes mellitus ([name]) 08/17/2016   [name] 05/02/2016   Hypertension associated with diabetes ([name]) 05/02/2016    PCP: Ardith Dark, MD   REFERRING PROVIDER: Ardith Dark, MD   REFERRING DIAG: R53.81 (ICD-10-CM) - Physical debility   THERAPY DIAG:  Muscle weakness (generalized)  Other abnormalities of gait and mobility  Difficulty in walking, not elsewhere  classified  Rationale for Evaluation and Treatment: Rehabilitation  ONSET DATE: 2022  SUBJECTIVE:   SUBJECTIVE STATEMENT: Per patient's brother Billly, they are concerned with his legs shaking at times when he is getting ready to walk.   From eval: Patient has been to two different therapy places and not had good experiences. One at [facility], one private practice. I want to be able to walk without the walker and I'm having trouble getting out of chairs without the walker. Presently sits in a dining chair. Can't sit in his recliner in his bedroom anymore. Wants to work on balance also. Joined [employer] which has a  track and an elevator (on pause right now due to disability). Periodically legs shake when he is walking.   Brother: [name]   PERTINENT HISTORY: Compression fx L1 2022, CAD, CKD (stage 4), DM, cardiac arrest 2017, neuropathy, retinopathy Long history of gait abnormality beginning back in 2022. He has a history of CAD and believes that his debility began following a covid infection. In December odf 2022 he fell and suffered a compression fracture of L1.  PAIN:  Are you having pain? No  PRECAUTIONS: Fall  RED FLAGS: None   WEIGHT BEARING RESTRICTIONS: No  FALLS:  Has patient fallen in last 6 months? No and with sit to stand falls back sometimes  LIVING ENVIRONMENT: Lives with: lives with their family Lives in: House/apartment Stairs: No Has following equipment at home: Single point cane, Environmental consultant - 2 wheeled, Environmental consultant - 4 wheeled, shower chair, Grab bars, and stand up walker also; also has shower chair at sink for shaving, brushing teeth etc, also has transporter.  OCCUPATION: retired  PLOF: Independent with household mobility with device  PATIENT GOALS: to walk without a walker  NEXT MD VISIT: 09/28/22  OBJECTIVE:  Note: Objective measures were completed at Evaluation unless otherwise noted.  DIAGNOSTIC FINDINGS: N/A  COGNITION: Overall cognitive status: Within functional limits for tasks assessed     SENSATION: Neuropathy in B feet   MUSCLE LENGTH: Marked HS R>L, hip flexor tightness   POSTURE: rounded shoulders and forward head  LOWER EXTREMITY ROM: WFL for tasks assessed  LOWER EXTREMITY MMT: *tested in hooklying  MMT Right eval Left eval  Hip flexion 4 4  Hip extension Able to bridge Able to bridge  Hip abduction 4-* 4-*  Hip adduction    Hip internal rotation    Hip external rotation    Knee flexion    Knee extension 4 4+  Ankle dorsiflexion 4+ 4-  Ankle plantarflexion    Ankle inversion    Ankle eversion     (Blank rows = not  tested)   FUNCTIONAL TESTS:  30 seconds chair stand test Timed up and go (TUG): 59.36 with 4WW Stance time: 45 sec patient reports unsteadiness and feels he goes backwards  GAIT: Distance walked: 25 Assistive device utilized: Walker - 4 wheeled Level of assistance: Modified independence Comments: slow speed, decreased step and stride, decreased heel strike, forward trunk lean.  TRANSFERS: Sit to stand: Uses walker to pull up; stand to sit: does not fully back up to chair and does not reach back; Supine to sit: needs min A for log roll  (has some back pain with sit <>supine due to sitting upward from supine) also fear of falling from bed, sit to supine needs min A to lift legs on to mat table  TODAY'S TREATMENT:  DATE:    07/16/2023 NuStep Level 1 6 minutes- PT present to discuss status Standing marching x 10 B at [facility] Standing hip abduction x 10 B at [employer] 2# AW 2 x 10 each (harder on Rt compared to Lt) - cues to not lift thigh off chair - some back pain reported so advised 5 sec hold with no weight at home Sit to stand and stand to sit x 15 ea working on correct form with cueing for hand position, forward bending, straightening knees and contracting gluteals; making sure legs are touching chair, reaching back with both hands for chair Discussed use of foam pad in recliner to assist with sit to stand from this chair; also discussed acquiring a standard chair with arms for exercising.   07/07/2023 NuStep Level 1 5 minutes- PT present to discuss status Supine Bridging x 12- max verbal and tactile cues Hooklying hip abduction x20 Hooklying Hip adduction x 20 Seated LAQ 2# AW 2 x 10 each (harder on Rt compared to Lt) Seated Marching 2# AW 2 x 10 each Standing heel raises x 10 6 inch Step taps x 20 bilateral UE suppor SBA- CGA Sit to stands with UE  support x 7  07/02/23 See pt ed and HEP  Transfers: cues to push up from chair versus using walker to pull up, back up fully to chair and reach back before sitting, Use log roll for supine to sit (fearful).  PATIENT EDUCATION:  Education details: PT eval findings, anticipated POC, initial HEP, postural awareness, and transfer safety  Person educated: Patient and brother Education method: Explanation, Demonstration, and Handouts Education comprehension: verbalized understanding and returned demonstration  HOME EXERCISE PROGRAM: Access Code: [email or web address] URL: [email or web address] Date: 07/16/2023 Prepared by: Raynelle Fanning  Exercises - Supine Bridge  - 2 x daily - 7 x weekly - 1-3 sets - 10 reps - Sit to Stand with Counter Support  - 3 x daily - 3-4 x weekly - 1 sets - 1-5 reps - Hooklying Clamshell with Resistance  - 2 x daily - 3-4 x weekly - 1 sets - 10 reps - Seated Long Arc Quad  - 1 x daily - 7 x weekly - 1-3 sets - 10 reps - 5 sec hold - Standing Hip Abduction with Counter Support  - 1 x daily - 7 x weekly - 1-2 sets - 10 reps - Standing March with Counter Support  - 1 x daily - 7 x weekly - 1-2 sets - 10 reps - Standing Heel Raise with Support  - 1 x daily - 7 x weekly - 1-2 sets - 10 reps  Patient Education - Tips to reduce freezing episodes with standing or walking  ASSESSMENT:  CLINICAL IMPRESSION: Today's treatment session focused on general strengthening and sit <> stand transfers.Patient was challenged with standing hip ABD and marching, but able to complete with a short rest only when lifting R leg. [employer] continued to require verbal and tactile cues for correct sit <>stand initially, but was able to complete with CGA/SBA at the [facility] by then end of the session. He did become tearful midway due to his delayed processing of steps. Patient and brother expressed concern about his episodes of legs shaking and gait freezing which we plan to address next visit.    OBJECTIVE IMPAIRMENTS: Abnormal gait, decreased balance, decreased ROM, decreased strength, decreased safety awareness, dizziness, impaired flexibility, impaired sensation, postural dysfunction, and pain.   ACTIVITY LIMITATIONS: carrying, lifting, bending, standing, squatting, stairs, transfers,  bed mobility, continence, bathing, and locomotion level  PARTICIPATION LIMITATIONS: shopping and community activity  PERSONAL FACTORS: Age, Fitness, Time since onset of injury/illness/exacerbation, and 3+ comorbidities: Compression fx L1 2022, CAD, CKD (stage 4), neuropathy, retinopathy  are also affecting patient's functional outcome.   REHAB POTENTIAL: Good  CLINICAL DECISION MAKING: Unstable/unpredictable  EVALUATION COMPLEXITY: High   GOALS: Goals reviewed with patient? Yes  SHORT TERM GOALS: Target date: 07/30/23 Improved TUG to < 25 sec to decrease fall risk.  Baseline: Goal status: INITIAL  2.  Able to perform 5XSTS without UE assist and controlled descent showing improved LE strength.  Baseline:  Goal status: INITIAL  3.  Patient will be able to stand safely for 2 min without UE assist Baseline:  Goal status: INITIAL   LONG TERM GOALS: Target date: 08/27/23  Ind with advanced HEP Baseline:  Goal status: INITIAL  2. Patient will be able to ambulate 600' with LRAD with good safety to access community.  Baseline:  Goal status: INITIAL  3.  Patient able to perform bed mobility with SBA Baseline:  Goal status: INITIAL  4.  Improved 30 sec chair test to 5 reps  Baseline:  Goal status: INITIAL  5.  Patient able to get in and out of his recliner at home with good control and no LOB Baseline:  Goal status: INITIAL    PLAN:  PT FREQUENCY: 3x/week  PT DURATION: 8 weeks  PLANNED INTERVENTIONS: 97164- PT Re-evaluation, 97110-Therapeutic exercises, 97530- Therapeutic activity, 97112- Neuromuscular re-education, 97535- Self Care, 78295- Manual therapy, [phone]- Gait  training, [phone]- Aquatic Therapy, 97014- Electrical stimulation (unattended), Patient/Family education, Balance training, Stair training, Taping, Joint mobilization, Spinal mobilization, DME instructions, Cryotherapy, and Moist heat  PLAN FOR NEXT SESSION: print HEP, gait freeze info, continue general strengthening,  standing tolerance, balance,   Solon Palm, PT  07/16/23 2:29 PM

## 2023-07-16 ENCOUNTER — Ambulatory Visit: Payer: PPO | Attending: Family Medicine | Admitting: Physical Therapy

## 2023-07-16 ENCOUNTER — Encounter: Payer: Self-pay | Admitting: Physical Therapy

## 2023-07-16 DIAGNOSIS — M6281 Muscle weakness (generalized): Secondary | ICD-10-CM | POA: Diagnosis not present

## 2023-07-16 DIAGNOSIS — R262 Difficulty in walking, not elsewhere classified: Secondary | ICD-10-CM | POA: Diagnosis not present

## 2023-07-16 DIAGNOSIS — R2689 Other abnormalities of gait and mobility: Secondary | ICD-10-CM | POA: Insufficient documentation

## 2023-07-17 ENCOUNTER — Encounter: Payer: Self-pay | Admitting: Physical Therapy

## 2023-07-17 ENCOUNTER — Ambulatory Visit: Payer: PPO | Admitting: Physical Therapy

## 2023-07-17 DIAGNOSIS — M6281 Muscle weakness (generalized): Secondary | ICD-10-CM

## 2023-07-17 DIAGNOSIS — R262 Difficulty in walking, not elsewhere classified: Secondary | ICD-10-CM

## 2023-07-17 DIAGNOSIS — R2689 Other abnormalities of gait and mobility: Secondary | ICD-10-CM

## 2023-07-17 NOTE — Therapy (Signed)
OUTPATIENT PHYSICAL THERAPY LOWER EXTREMITY TREATMENT   Patient Name: Jeffrey [name] MRN: [id number] DOB:1955/09/07, 67 y.o., male Today's Date: 07/17/2023  END OF SESSION:  PT End of Session - 07/17/23 0845     Visit Number 4    Date for PT Re-Evaluation 08/27/23    Authorization Type HTA    Progress Note Due on Visit 10    PT Start Time 0845    PT Stop Time 0913    PT Time Calculation (min) 28 min    Equipment Utilized During Treatment Gait belt    Activity Tolerance Patient tolerated treatment well    Behavior During Therapy WFL for tasks assessed/performed               Past Medical History:  Diagnosis Date   Cardiac arrest ([name]) 05/22/2016   Cataract    CHF (congestive heart failure) ([name])    CKD (chronic kidney disease) stage 3, GFR 30-59 ml/min ([name]) 05/28/2021   CKD (chronic kidney disease) stage 4, GFR 15-29 ml/min ([name]) 05/28/2021   Diabetes mellitus without complication ([name])    type 2   Diabetic retinopathy ([name])    Hyperlipidemia    Hypertension    Memory loss    mild   Myocardial infarct ([name]) 2105   Retinopathy    Both eyes   S/P primary angioplasty with coronary stent 05/22/2016   Vitamin B12 deficiency    Vitreous hemorrhage of left eye ([name])    proliferative diabetic retinopathy   Past Surgical History:  Procedure Laterality Date   ANGIOPLASTY     2015   COLONOSCOPY     multiple   EYE SURGERY     PARS PLANA VITRECTOMY Left 03/15/2020   Procedure: PARS PLANA VITRECTOMY WITH 25 GAUGE;  Surgeon: Stephannie Li, MD;  Location: [facility] OR;  Service: Ophthalmology;  Laterality: Left;   PHOTOCOAGULATION WITH LASER Left 03/15/2020   Procedure: PHOTOCOAGULATION WITH LASER; INTRAVITREAL INJECTION OF AVASTIN;  Surgeon: Stephannie Li, MD;  Location: [facility] OR;  Service: Ophthalmology;  Laterality: Left;   REFRACTIVE SURGERY     UPPER GI ENDOSCOPY     several   VITRECTOMY     Patient Active Problem List   Diagnosis Date Noted   Rhinitis 05/20/2023    Cervical spondylosis 08/26/2022   CKD (chronic kidney disease) stage 4, GFR 15-29 ml/min ([name]) 05/28/2021   Adjustment disorder 01/22/2021   Chronic back pain 10/22/2020   Peripheral vertigo 05/15/2020   Dermatitis 01/04/2020   Unintentional weight loss with loose stools 05/19/2019   Low vitamin B12 level 03/29/2019   Heme positive stool 03/14/2019   Normocytic anemia 03/14/2019   Chronic diastolic heart failure ([name]) 02/08/2019   Diabetic retinopathy ([name]) 09/08/2018   Physical debility 05/24/2018   Varicose veins of both lower extremities 03/01/2018   Fatigue due to exertion 10/14/2017   GERD (gastroesophageal reflux disease) 08/25/2017   CAD in native artery 04/14/2017   Hyperlipidemia associated with type 2 diabetes mellitus ([name]) 04/14/2017   Diabetic peripheral neuropathy associated with type 2 diabetes mellitus ([name]) 08/17/2016   [name] 05/02/2016   Hypertension associated with diabetes ([name]) 05/02/2016    PCP: Ardith Dark, MD   REFERRING PROVIDER: Ardith Dark, MD   REFERRING DIAG: R53.81 (ICD-10-CM) - Physical debility   THERAPY DIAG:  Muscle weakness (generalized)  Other abnormalities of gait and mobility  Difficulty in walking, not elsewhere classified  Rationale for Evaluation and Treatment: Rehabilitation  ONSET DATE: 2022  SUBJECTIVE:   SUBJECTIVE STATEMENT: Patient reports he was sick from drinking strong coffee this morning.   From eval: Patient has been to two different therapy places and not had good experiences. One at [facility], one private practice. I want to be able to walk without the walker and I'm having trouble getting out of chairs without the walker. Presently sits in a dining chair. Can't sit in his recliner in his bedroom anymore. Wants to work on balance also. Joined [employer] which has a track and an elevator (on pause right now due to disability). Periodically legs shake when he is walking.    Brother: [name]   PERTINENT HISTORY: Compression fx L1 2022, CAD, CKD (stage 4), DM, cardiac arrest 2017, neuropathy, retinopathy Long history of gait abnormality beginning back in 2022. He has a history of CAD and believes that his debility began following a covid infection. In December odf 2022 he fell and suffered a compression fracture of L1.  PAIN:  Are you having pain? No  PRECAUTIONS: Fall  RED FLAGS: None   WEIGHT BEARING RESTRICTIONS: No  FALLS:  Has patient fallen in last 6 months? No and with sit to stand falls back sometimes  LIVING ENVIRONMENT: Lives with: lives with their family Lives in: House/apartment Stairs: No Has following equipment at home: Single point cane, Environmental consultant - 2 wheeled, Environmental consultant - 4 wheeled, shower chair, Grab bars, and stand up walker also; also has shower chair at sink for shaving, brushing teeth etc, also has transporter.  OCCUPATION: retired  PLOF: Independent with household mobility with device  PATIENT GOALS: to walk without a walker  NEXT MD VISIT: 09/28/22  OBJECTIVE:  Note: Objective measures were completed at Evaluation unless otherwise noted.  DIAGNOSTIC FINDINGS: N/A  COGNITION: Overall cognitive status: Within functional limits for tasks assessed     SENSATION: Neuropathy in B feet   MUSCLE LENGTH: Marked HS R>L, hip flexor tightness   POSTURE: rounded shoulders and forward head  LOWER EXTREMITY ROM: WFL for tasks assessed  LOWER EXTREMITY MMT: *tested in hooklying  MMT Right eval Left eval  Hip flexion 4 4  Hip extension Able to bridge Able to bridge  Hip abduction 4-* 4-*  Hip adduction    Hip internal rotation    Hip external rotation    Knee flexion    Knee extension 4 4+  Ankle dorsiflexion 4+ 4-  Ankle plantarflexion    Ankle inversion    Ankle eversion     (Blank rows = not tested)   FUNCTIONAL TESTS:  30 seconds chair stand test Timed up and go (TUG): 59.36 with 4WW Stance time: 45 sec patient  reports unsteadiness and feels he goes backwards  GAIT: Distance walked: 25 Assistive device utilized: Walker - 4 wheeled Level of assistance: Modified independence Comments: slow speed, decreased step and stride, decreased heel strike, forward trunk lean.  TRANSFERS: Sit to stand: Uses walker to pull up; stand to sit: does not fully back up to chair and does not reach back; Supine to sit: needs min A for log roll  (has some back pain with sit <>supine due to sitting upward from supine) also fear of falling from bed, sit to supine needs min A to lift legs on to mat table  TODAY'S TREATMENT:  DATE:    07/16/2023 NuStep Level 1 56 minutes- PT present to discuss status Went over tips for gait freezing  Standing hip abduction x 10 B at W/C  Patient became nauseous; sugar tested 178; pt then vomited so treatment ended.   07/07/2023 NuStep Level 1 5 minutes- PT present to discuss status Supine Bridging x 12- max verbal and tactile cues Hooklying hip abduction x20 Hooklying Hip adduction x 20 Seated LAQ 2# AW 2 x 10 each (harder on Rt compared to Lt) Seated Marching 2# AW 2 x 10 each Standing heel raises x 10 6 inch Step taps x 20 bilateral UE suppor SBA- CGA Sit to stands with UE support x 7  07/02/23 See pt ed and HEP  Transfers: cues to push up from chair versus using walker to pull up, back up fully to chair and reach back before sitting, Use log roll for supine to sit (fearful).  PATIENT EDUCATION:  Education details: PT eval findings, anticipated POC, initial HEP, postural awareness, and transfer safety  Person educated: Patient and brother Education method: Explanation, Demonstration, and Handouts Education comprehension: verbalized understanding and returned demonstration  HOME EXERCISE PROGRAM: Access Code: [email or web address] URL:  [email or web address] Date: 07/16/2023 Prepared by: Raynelle Fanning  Exercises - Supine Bridge  - 2 x daily - 7 x weekly - 1-3 sets - 10 reps - Sit to Stand with Counter Support  - 3 x daily - 3-4 x weekly - 1 sets - 1-5 reps - Hooklying Clamshell with Resistance  - 2 x daily - 3-4 x weekly - 1 sets - 10 reps - Seated Long Arc Quad  - 1 x daily - 7 x weekly - 1-3 sets - 10 reps - 5 sec hold - Standing Hip Abduction with Counter Support  - 1 x daily - 7 x weekly - 1-2 sets - 10 reps - Standing March with Counter Support  - 1 x daily - 7 x weekly - 1-2 sets - 10 reps - Standing Heel Raise with Support  - 1 x daily - 7 x weekly - 1-2 sets - 10 reps  Patient Education - Tips to reduce freezing episodes with standing or walking  ASSESSMENT:  CLINICAL IMPRESSION: Patient presents today in w/c. He said it was too cold to use the walker. Once on Nustep, patient reported that he vomited this morning after drinking coffee. PT encouraged patient to leave secondary to sickness, but pt said it was from the coffee. Shortly into treatment patient reported feeling sick again and subsequently vomited. Treatment was ended.   OBJECTIVE IMPAIRMENTS: Abnormal gait, decreased balance, decreased ROM, decreased strength, decreased safety awareness, dizziness, impaired flexibility, impaired sensation, postural dysfunction, and pain.   ACTIVITY LIMITATIONS: carrying, lifting, bending, standing, squatting, stairs, transfers, bed mobility, continence, bathing, and locomotion level  PARTICIPATION LIMITATIONS: shopping and community activity  PERSONAL FACTORS: Age, Fitness, Time since onset of injury/illness/exacerbation, and 3+ comorbidities: Compression fx L1 2022, CAD, CKD (stage 4), neuropathy, retinopathy  are also affecting patient's functional outcome.   REHAB POTENTIAL: Good  CLINICAL DECISION MAKING: Unstable/unpredictable  EVALUATION COMPLEXITY: High   GOALS: Goals reviewed with patient?  Yes  SHORT TERM GOALS: Target date: 07/30/23 Improved TUG to < 25 sec to decrease fall risk.  Baseline: Goal status: INITIAL  2.  Able to perform 5XSTS without UE assist and controlled descent showing improved LE strength.  Baseline:  Goal status: INITIAL  3.  Patient will be able to stand safely for 2 min without UE assist  Baseline:  Goal status: INITIAL   LONG TERM GOALS: Target date: 08/27/23  Ind with advanced HEP Baseline:  Goal status: INITIAL  2. Patient will be able to ambulate 600' with LRAD with good safety to access community.  Baseline:  Goal status: INITIAL  3.  Patient able to perform bed mobility with SBA Baseline:  Goal status: INITIAL  4.  Improved 30 sec chair test to 5 reps  Baseline:  Goal status: INITIAL  5.  Patient able to get in and out of his recliner at home with good control and no LOB Baseline:  Goal status: INITIAL    PLAN:  PT FREQUENCY: 3x/week  PT DURATION: 8 weeks  PLANNED INTERVENTIONS: 97164- PT Re-evaluation, 97110-Therapeutic exercises, 97530- Therapeutic activity, 97112- Neuromuscular re-education, 97535- Self Care, 09811- Manual therapy, [phone]- Gait training, [phone]- Aquatic Therapy, 97014- Electrical stimulation (unattended), Patient/Family education, Balance training, Stair training, Taping, Joint mobilization, Spinal mobilization, DME instructions, Cryotherapy, and Moist heat  PLAN FOR NEXT SESSION: continue general strengthening,  standing tolerance, balance,   Solon Palm, PT  07/17/23 9:26 AM

## 2023-07-20 ENCOUNTER — Ambulatory Visit: Payer: PPO | Admitting: Physical Therapy

## 2023-07-21 ENCOUNTER — Ambulatory Visit: Payer: PPO | Admitting: Physical Therapy

## 2023-07-22 ENCOUNTER — Ambulatory Visit: Payer: PPO | Admitting: Physical Therapy

## 2023-07-22 DIAGNOSIS — M6281 Muscle weakness (generalized): Secondary | ICD-10-CM | POA: Diagnosis not present

## 2023-07-22 DIAGNOSIS — R2689 Other abnormalities of gait and mobility: Secondary | ICD-10-CM

## 2023-07-22 DIAGNOSIS — R262 Difficulty in walking, not elsewhere classified: Secondary | ICD-10-CM

## 2023-07-22 NOTE — Therapy (Signed)
OUTPATIENT PHYSICAL THERAPY LOWER EXTREMITY TREATMENT   Patient Name: Jeffrey [name] MRN: [id number] DOB:[birth year], 67 y.o., male Today's Date: 07/22/2023  END OF SESSION:  PT End of Session - 07/22/23 1057     Visit Number 5    Date for PT Re-Evaluation 08/27/23    Authorization Type HTA    Progress Note Due on Visit 10    PT Start Time 1100    PT Stop Time 1140    PT Time Calculation (min) 40 min    Equipment Utilized During Treatment Gait belt    Activity Tolerance Patient tolerated treatment well               Past Medical History:  Diagnosis Date   Cardiac arrest ([name]) 05/22/2016   Cataract    CHF (congestive heart failure) ([name])    CKD (chronic kidney disease) stage 3, GFR 30-59 ml/min ([name]) 05/28/2021   CKD (chronic kidney disease) stage 4, GFR 15-29 ml/min ([name]) 05/28/2021   Diabetes mellitus without complication ([name])    type 2   Diabetic retinopathy ([name])    Hyperlipidemia    Hypertension    Memory loss    mild   Myocardial infarct ([name]) 2105   Retinopathy    Both eyes   S/P primary angioplasty with coronary stent 05/22/2016   Vitamin B12 deficiency    Vitreous hemorrhage of left eye ([name])    proliferative diabetic retinopathy   Past Surgical History:  Procedure Laterality Date   ANGIOPLASTY     2015   COLONOSCOPY     multiple   EYE SURGERY     PARS PLANA VITRECTOMY Left 03/15/2020   Procedure: PARS PLANA VITRECTOMY WITH 25 GAUGE;  Surgeon: Stephannie Li, MD;  Location: [facility] OR;  Service: Ophthalmology;  Laterality: Left;   PHOTOCOAGULATION WITH LASER Left 03/15/2020   Procedure: PHOTOCOAGULATION WITH LASER; INTRAVITREAL INJECTION OF AVASTIN;  Surgeon: Stephannie Li, MD;  Location: [facility] OR;  Service: Ophthalmology;  Laterality: Left;   REFRACTIVE SURGERY     UPPER GI ENDOSCOPY     several   VITRECTOMY     Patient Active Problem List   Diagnosis Date Noted   Rhinitis 05/20/2023   Cervical spondylosis 08/26/2022   CKD (chronic kidney disease)  stage 4, GFR 15-29 ml/min ([name]) 05/28/2021   Adjustment disorder 01/22/2021   Chronic back pain 10/22/2020   Peripheral vertigo 05/15/2020   Dermatitis 01/04/2020   Unintentional weight loss with loose stools 05/19/2019   Low vitamin B12 level 03/29/2019   Heme positive stool 03/14/2019   Normocytic anemia 03/14/2019   Chronic diastolic heart failure ([name]) 02/08/2019   Diabetic retinopathy ([name]) 09/08/2018   Physical debility 05/24/2018   Varicose veins of both lower extremities 03/01/2018   Fatigue due to exertion 10/14/2017   GERD (gastroesophageal reflux disease) 08/25/2017   CAD in native artery 04/14/2017   Hyperlipidemia associated with type 2 diabetes mellitus ([name]) 04/14/2017   Diabetic peripheral neuropathy associated with type 2 diabetes mellitus ([name]) 08/17/2016   [name] 05/02/2016   Hypertension associated with diabetes ([name]) 05/02/2016    PCP: Ardith Dark, MD   REFERRING PROVIDER: Ardith Dark, MD   REFERRING DIAG: R53.81 (ICD-10-CM) - Physical debility   THERAPY DIAG:  No diagnosis found.  Rationale for Evaluation and Treatment: Rehabilitation  ONSET DATE: 2022  SUBJECTIVE:   SUBJECTIVE STATEMENT: Feeling fine today.  Likes coming to PT.  After last visit, (when sick) I had a hard time  getting back into my building, my brother had to get my transporter.  Doing a little better getting out of the chair but I need my sneakers on.     From eval: Patient has been to two different therapy places and not had good experiences. One at [facility], one private practice. I want to be able to walk without the walker and I'm having trouble getting out of chairs without the walker. Presently sits in a dining chair. Can't sit in his recliner in his bedroom anymore. Wants to work on balance also. Joined [employer] which has a track and an elevator (on pause right now due to disability). Periodically legs shake when he is walking.   Brother:  [name]   PERTINENT HISTORY: Compression fx L1 2022, CAD, CKD (stage 4), DM, cardiac arrest 2017, neuropathy, retinopathy Long history of gait abnormality beginning back in 2022. He has a history of CAD and believes that his debility began following a covid infection. In December odf 2022 he fell and suffered a compression fracture of L1.  PAIN:  Are you having pain? No  PRECAUTIONS: Fall  RED FLAGS: None   WEIGHT BEARING RESTRICTIONS: No  FALLS:  Has patient fallen in last 6 months? No and with sit to stand falls back sometimes  LIVING ENVIRONMENT: Lives with: lives with their family Lives in: House/apartment Stairs: No Has following equipment at home: Single point cane, Environmental consultant - 2 wheeled, Environmental consultant - 4 wheeled, shower chair, Grab bars, and stand up walker also; also has shower chair at sink for shaving, brushing teeth etc, also has transporter.  OCCUPATION: retired  PLOF: Independent with household mobility with device  PATIENT GOALS: to walk without a walker  NEXT MD VISIT: 09/28/22  OBJECTIVE:  Note: Objective measures were completed at Evaluation unless otherwise noted.  DIAGNOSTIC FINDINGS: N/A  COGNITION: Overall cognitive status: Within functional limits for tasks assessed     SENSATION: Neuropathy in B feet   MUSCLE LENGTH: Marked HS R>L, hip flexor tightness   POSTURE: rounded shoulders and forward head  LOWER EXTREMITY ROM: WFL for tasks assessed  LOWER EXTREMITY MMT: *tested in hooklying  MMT Right eval Left eval  Hip flexion 4 4  Hip extension Able to bridge Able to bridge  Hip abduction 4-* 4-*  Hip adduction    Hip internal rotation    Hip external rotation    Knee flexion    Knee extension 4 4+  Ankle dorsiflexion 4+ 4-  Ankle plantarflexion    Ankle inversion    Ankle eversion     (Blank rows = not tested)   FUNCTIONAL TESTS:  30 seconds chair stand test Timed up and go (TUG): 59.36 with 4WW Stance time: 45 sec patient reports  unsteadiness and feels he goes backwards  GAIT: Distance walked: 25 Assistive device utilized: Walker - 4 wheeled Level of assistance: Modified independence Comments: slow speed, decreased step and stride, decreased heel strike, forward trunk lean.  TRANSFERS: Sit to stand: Uses walker to pull up; stand to sit: does not fully back up to chair and does not reach back; Supine to sit: needs min A for log roll  (has some back pain with sit <>supine due to sitting upward from supine) also fear of falling from bed, sit to supine needs min A to lift legs on to mat table  TODAY'S TREATMENT:  DATE:   07/22/23: NuStep Level 1 5 minutes- PT present to discuss status Seated LAQ 2# AW  x 10 each  Seated 2# ankle weight hip flexion/abduction to simulate lifting leg in/out of the car 10 each (very challenging) Standing heel raises x 10 Seated trunk extension medium pink power cord 15x Sit to stands 5x heavy arm use; purple cushion still needs heavy arm use Alternating UE reaching just above eye level 10x (pt needs single arm support at all times) Circles on the floor  touch toe to arc on floor heavy UE use on railing  6 inch Step taps x 10 bilateral heavy UE support on railing, SBA- CGA RPE 3-4/10 Gait with RW 60 feet with pt complaining of hand pain with heavy leaning on arms (pt has gloves but does not have them today) Pt reports very fatigued especially in arms. Min assist transfer to facility W/C, pt's brother transported to the car 07/16/2023 NuStep Level 1 56 minutes- PT present to discuss status Went over tips for gait freezing  Standing hip abduction x 10 B at W/C  Patient became nauseous; sugar tested 178; pt then vomited so treatment ended.   07/07/2023 NuStep Level 1 5 minutes- PT present to discuss status Supine Bridging x 12- max verbal and tactile cues Hooklying  hip abduction x20 Hooklying Hip adduction x 20 Seated LAQ 2# AW 2 x 10 each (harder on Rt compared to Lt) Seated Marching 2# AW 2 x 10 each Standing heel raises x 10 6 inch Step taps x 20 bilateral UE suppor SBA- CGA Sit to stands with UE support x 7  07/02/23 See pt ed and HEP  Transfers: cues to push up from chair versus using walker to pull up, back up fully to chair and reach back before sitting, Use log roll for supine to sit (fearful).  PATIENT EDUCATION:  Education details: PT eval findings, anticipated POC, initial HEP, postural awareness, and transfer safety  Person educated: Patient and brother Education method: Explanation, Demonstration, and Handouts Education comprehension: verbalized understanding and returned demonstration  HOME EXERCISE PROGRAM: Access Code: [email or web address] URL: [email or web address] Date: 07/16/2023 Prepared by: Raynelle Fanning  Exercises - Supine Bridge  - 2 x daily - 7 x weekly - 1-3 sets - 10 reps - Sit to Stand with Counter Support  - 3 x daily - 3-4 x weekly - 1 sets - 1-5 reps - Hooklying Clamshell with Resistance  - 2 x daily - 3-4 x weekly - 1 sets - 10 reps - Seated Long Arc Quad  - 1 x daily - 7 x weekly - 1-3 sets - 10 reps - 5 sec hold - Standing Hip Abduction with Counter Support  - 1 x daily - 7 x weekly - 1-2 sets - 10 reps - Standing March with Counter Support  - 1 x daily - 7 x weekly - 1-2 sets - 10 reps - Standing Heel Raise with Support  - 1 x daily - 7 x weekly - 1-2 sets - 10 reps  Patient Education - Tips to reduce freezing episodes with standing or walking  ASSESSMENT:  CLINICAL IMPRESSION: Heavy reliance on Ues with all standing ex's and with gait with RW.  Max verbal cues for using chair armrests to rise (rather than pulling from the walker) and also to reach for armrests before descending.  Cues for full hip extension in standing secondary to forward trunk lean.  His fatigue level rating was low at only a 3-4 RPE out of  10 prior to a walk with RW.  He fatigued rapidly with walking with increasing forward trunk lean and shorter step lengths and states, "I think we overdid it."  His hands were also painful with heavily leaning on the walker. He used the facility W/C to get to the car.  Therapist providing close CGA with gait belt with all standing/walking.    OBJECTIVE IMPAIRMENTS: Abnormal gait, decreased balance, decreased ROM, decreased strength, decreased safety awareness, dizziness, impaired flexibility, impaired sensation, postural dysfunction, and pain.   ACTIVITY LIMITATIONS: carrying, lifting, bending, standing, squatting, stairs, transfers, bed mobility, continence, bathing, and locomotion level  PARTICIPATION LIMITATIONS: shopping and community activity  PERSONAL FACTORS: Age, Fitness, Time since onset of injury/illness/exacerbation, and 3+ comorbidities: Compression fx L1 2022, CAD, CKD (stage 4), neuropathy, retinopathy  are also affecting patient's functional outcome.   REHAB POTENTIAL: Good  CLINICAL DECISION MAKING: Unstable/unpredictable  EVALUATION COMPLEXITY: High   GOALS: Goals reviewed with patient? Yes  SHORT TERM GOALS: Target date: 07/30/23 Improved TUG to < 25 sec to decrease fall risk.  Baseline: Goal status: INITIAL  2.  Able to perform 5XSTS without UE assist and controlled descent showing improved LE strength.  Baseline:  Goal status: INITIAL  3.  Patient will be able to stand safely for 2 min without UE assist Baseline:  Goal status: INITIAL   LONG TERM GOALS: Target date: 08/27/23  Ind with advanced HEP Baseline:  Goal status: INITIAL  2. Patient will be able to ambulate 600' with LRAD with good safety to access community.  Baseline:  Goal status: INITIAL  3.  Patient able to perform bed mobility with SBA Baseline:  Goal status: INITIAL  4.  Improved 30 sec chair test to 5 reps  Baseline:  Goal status: INITIAL  5.  Patient able to get in and out of his  recliner at home with good control and no LOB Baseline:  Goal status: INITIAL    PLAN:  PT FREQUENCY: 3x/week  PT DURATION: 8 weeks  PLANNED INTERVENTIONS: 97164- PT Re-evaluation, 97110-Therapeutic exercises, 97530- Therapeutic activity, 97112- Neuromuscular re-education, 97535- Self Care, 14782- Manual therapy, [phone]- Gait training, [phone]- Aquatic Therapy, 97014- Electrical stimulation (unattended), Patient/Family education, Balance training, Stair training, Taping, Joint mobilization, Spinal mobilization, DME instructions, Cryotherapy, and Moist heat  PLAN FOR NEXT SESSION: continue general strengthening,  standing tolerance, balance, pt to bring padded gloves next time  Lavinia Sharps, PT 07/22/23 6:56 PM Phone: [phone] Fax: [phone]

## 2023-07-23 NOTE — Therapy (Incomplete)
OUTPATIENT PHYSICAL THERAPY LOWER EXTREMITY TREATMENT   Patient Name: Jeffrey [name] MRN: [id number] DOB:08/09/1956, 67 y.o., male Today's Date: 07/23/2023  END OF SESSION:      Past Medical History:  Diagnosis Date   Cardiac arrest ([name]) 05/22/2016   Cataract    CHF (congestive heart failure) ([name])    CKD (chronic kidney disease) stage 3, GFR 30-59 ml/min ([name]) 05/28/2021   CKD (chronic kidney disease) stage 4, GFR 15-29 ml/min ([name]) 05/28/2021   Diabetes mellitus without complication ([name])    type 2   Diabetic retinopathy ([name])    Hyperlipidemia    Hypertension    Memory loss    mild   Myocardial infarct ([name]) 2105   Retinopathy    Both eyes   S/P primary angioplasty with coronary stent 05/22/2016   Vitamin B12 deficiency    Vitreous hemorrhage of left eye ([name])    proliferative diabetic retinopathy   Past Surgical History:  Procedure Laterality Date   ANGIOPLASTY     2015   COLONOSCOPY     multiple   EYE SURGERY     PARS PLANA VITRECTOMY Left 03/15/2020   Procedure: PARS PLANA VITRECTOMY WITH 25 GAUGE;  Surgeon: Stephannie Li, MD;  Location: [facility] OR;  Service: Ophthalmology;  Laterality: Left;   PHOTOCOAGULATION WITH LASER Left 03/15/2020   Procedure: PHOTOCOAGULATION WITH LASER; INTRAVITREAL INJECTION OF AVASTIN;  Surgeon: Stephannie Li, MD;  Location: [facility] OR;  Service: Ophthalmology;  Laterality: Left;   REFRACTIVE SURGERY     UPPER GI ENDOSCOPY     several   VITRECTOMY     Patient Active Problem List   Diagnosis Date Noted   Rhinitis 05/20/2023   Cervical spondylosis 08/26/2022   CKD (chronic kidney disease) stage 4, GFR 15-29 ml/min ([name]) 05/28/2021   Adjustment disorder 01/22/2021   Chronic back pain 10/22/2020   Peripheral vertigo 05/15/2020   Dermatitis 01/04/2020   Unintentional weight loss with loose stools 05/19/2019   Low vitamin B12 level 03/29/2019   Heme positive stool 03/14/2019   Normocytic anemia 03/14/2019   Chronic diastolic heart  failure ([name]) 34/74/2595   Diabetic retinopathy ([name]) 09/08/2018   Physical debility 05/24/2018   Varicose veins of both lower extremities 03/01/2018   Fatigue due to exertion 10/14/2017   GERD (gastroesophageal reflux disease) 08/25/2017   CAD in native artery 04/14/2017   Hyperlipidemia associated with type 2 diabetes mellitus ([name]) 04/14/2017   Diabetic peripheral neuropathy associated with type 2 diabetes mellitus ([name]) 08/17/2016   [name] 05/02/2016   Hypertension associated with diabetes ([name]) 05/02/2016    PCP: Ardith Dark, MD   REFERRING PROVIDER: Ardith Dark, MD   REFERRING DIAG: R53.81 (ICD-10-CM) - Physical debility   THERAPY DIAG:  No diagnosis found.  Rationale for Evaluation and Treatment: Rehabilitation  ONSET DATE: 2022  SUBJECTIVE:   SUBJECTIVE STATEMENT: ***   From eval: Patient has been to two different therapy places and not had good experiences. One at [facility], one private practice. I want to be able to walk without the walker and I'm having trouble getting out of chairs without the walker. Presently sits in a dining chair. Can't sit in his recliner in his bedroom anymore. Wants to work on balance also. Joined [employer] which has a track and an elevator (on pause right now due to disability). Periodically legs shake when he is walking.   Brother: [name]   PERTINENT HISTORY: Compression fx L1 2022, CAD, CKD (stage 4), DM,  cardiac arrest 2017, neuropathy, retinopathy Long history of gait abnormality beginning back in 2022. He has a history of CAD and believes that his debility began following a covid infection. In December odf 2022 he fell and suffered a compression fracture of L1.  PAIN:  Are you having pain? No  PRECAUTIONS: Fall  RED FLAGS: None   WEIGHT BEARING RESTRICTIONS: No  FALLS:  Has patient fallen in last 6 months? No and with sit to stand falls back sometimes  LIVING ENVIRONMENT: Lives with:  lives with their family Lives in: House/apartment Stairs: No Has following equipment at home: Single point cane, Environmental consultant - 2 wheeled, Environmental consultant - 4 wheeled, shower chair, Grab bars, and stand up walker also; also has shower chair at sink for shaving, brushing teeth etc, also has transporter.  OCCUPATION: retired  PLOF: Independent with household mobility with device  PATIENT GOALS: to walk without a walker  NEXT MD VISIT: 09/28/22  OBJECTIVE:  Note: Objective measures were completed at Evaluation unless otherwise noted.  DIAGNOSTIC FINDINGS: N/A  COGNITION: Overall cognitive status: Within functional limits for tasks assessed     SENSATION: Neuropathy in B feet   MUSCLE LENGTH: Marked HS R>L, hip flexor tightness   POSTURE: rounded shoulders and forward head  LOWER EXTREMITY ROM: WFL for tasks assessed  LOWER EXTREMITY MMT: *tested in hooklying  MMT Right eval Left eval  Hip flexion 4 4  Hip extension Able to bridge Able to bridge  Hip abduction 4-* 4-*  Hip adduction    Hip internal rotation    Hip external rotation    Knee flexion    Knee extension 4 4+  Ankle dorsiflexion 4+ 4-  Ankle plantarflexion    Ankle inversion    Ankle eversion     (Blank rows = not tested)   FUNCTIONAL TESTS:  30 seconds chair stand test Timed up and go (TUG): 59.36 with 4WW Stance time: 45 sec patient reports unsteadiness and feels he goes backwards  GAIT: Distance walked: 25 Assistive device utilized: Walker - 4 wheeled Level of assistance: Modified independence Comments: slow speed, decreased step and stride, decreased heel strike, forward trunk lean.  TRANSFERS: Sit to stand: Uses walker to pull up; stand to sit: does not fully back up to chair and does not reach back; Supine to sit: needs min A for log roll  (has some back pain with sit <>supine due to sitting upward from supine) also fear of falling from bed, sit to supine needs min A to lift legs on to mat table  TODAY'S  TREATMENT:                                                                                                                              DATE:   07/24/23: NuStep Level 1 5 minutes- PT present to discuss status Seated LAQ 2# AW  x 10 each  Seated 2# ankle weight hip flexion/abduction to simulate lifting leg in/out  of the car 10 each (very challenging) Standing heel raises x 10 Seated trunk extension medium pink power cord 15x Sit to stands 5x heavy arm use; purple cushion still needs heavy arm use Alternating UE reaching just above eye level 10x (pt needs single arm support at all times) Circles on the floor  touch toe to arc on floor heavy UE use on railing  6 inch Step taps x 10 bilateral heavy UE support on railing, SBA- CGA  07/22/23: NuStep Level 1 5 minutes- PT present to discuss status Seated LAQ 2# AW  x 10 each  Seated 2# ankle weight hip flexion/abduction to simulate lifting leg in/out of the car 10 each (very challenging) Standing heel raises x 10 Seated trunk extension medium pink power cord 15x Sit to stands 5x heavy arm use; purple cushion still needs heavy arm use Alternating UE reaching just above eye level 10x (pt needs single arm support at all times) Circles on the floor  touch toe to arc on floor heavy UE use on railing  6 inch Step taps x 10 bilateral heavy UE support on railing, SBA- CGA RPE 3-4/10 Gait with RW 60 feet with pt complaining of hand pain with heavy leaning on arms (pt has gloves but does not have them today) Pt reports very fatigued especially in arms. Min assist transfer to facility W/C, pt's brother transported to the car 07/16/2023 NuStep Level 1 56 minutes- PT present to discuss status Went over tips for gait freezing  Standing hip abduction x 10 B at W/C  Patient became nauseous; sugar tested 178; pt then vomited so treatment ended.   07/07/2023 NuStep Level 1 5 minutes- PT present to discuss status Supine Bridging x 12- max verbal and tactile  cues Hooklying hip abduction x20 Hooklying Hip adduction x 20 Seated LAQ 2# AW 2 x 10 each (harder on Rt compared to Lt) Seated Marching 2# AW 2 x 10 each Standing heel raises x 10 6 inch Step taps x 20 bilateral UE suppor SBA- CGA Sit to stands with UE support x 7  07/02/23 See pt ed and HEP  Transfers: cues to push up from chair versus using walker to pull up, back up fully to chair and reach back before sitting, Use log roll for supine to sit (fearful).  PATIENT EDUCATION:  Education details: PT eval findings, anticipated POC, initial HEP, postural awareness, and transfer safety  Person educated: Patient and brother Education method: Explanation, Demonstration, and Handouts Education comprehension: verbalized understanding and returned demonstration  HOME EXERCISE PROGRAM: Access Code: [email or web address] URL: [email or web address] Date: 07/16/2023 Prepared by: Raynelle Fanning  Exercises - Supine Bridge  - 2 x daily - 7 x weekly - 1-3 sets - 10 reps - Sit to Stand with Counter Support  - 3 x daily - 3-4 x weekly - 1 sets - 1-5 reps - Hooklying Clamshell with Resistance  - 2 x daily - 3-4 x weekly - 1 sets - 10 reps - Seated Long Arc Quad  - 1 x daily - 7 x weekly - 1-3 sets - 10 reps - 5 sec hold - Standing Hip Abduction with Counter Support  - 1 x daily - 7 x weekly - 1-2 sets - 10 reps - Standing March with Counter Support  - 1 x daily - 7 x weekly - 1-2 sets - 10 reps - Standing Heel Raise with Support  - 1 x daily - 7 x weekly - 1-2 sets - 10 reps  Patient Education -  Tips to reduce freezing episodes with standing or walking  ASSESSMENT:  CLINICAL IMPRESSION: ***.  Therapist providing close CGA with gait belt with all standing/walking.    OBJECTIVE IMPAIRMENTS: Abnormal gait, decreased balance, decreased ROM, decreased strength, decreased safety awareness, dizziness, impaired flexibility, impaired sensation, postural dysfunction, and pain.   ACTIVITY LIMITATIONS:  carrying, lifting, bending, standing, squatting, stairs, transfers, bed mobility, continence, bathing, and locomotion level  PARTICIPATION LIMITATIONS: shopping and community activity  PERSONAL FACTORS: Age, Fitness, Time since onset of injury/illness/exacerbation, and 3+ comorbidities: Compression fx L1 2022, CAD, CKD (stage 4), neuropathy, retinopathy  are also affecting patient's functional outcome.   REHAB POTENTIAL: Good  CLINICAL DECISION MAKING: Unstable/unpredictable  EVALUATION COMPLEXITY: High   GOALS: Goals reviewed with patient? Yes  SHORT TERM GOALS: Target date: 07/30/23 Improved TUG to < 25 sec to decrease fall risk.  Baseline: Goal status: INITIAL  2.  Able to perform 5XSTS without UE assist and controlled descent showing improved LE strength.  Baseline:  Goal status: INITIAL  3.  Patient will be able to stand safely for 2 min without UE assist Baseline:  Goal status: INITIAL   LONG TERM GOALS: Target date: 08/27/23  Ind with advanced HEP Baseline:  Goal status: INITIAL  2. Patient will be able to ambulate 600' with LRAD with good safety to access community.  Baseline:  Goal status: INITIAL  3.  Patient able to perform bed mobility with SBA Baseline:  Goal status: INITIAL  4.  Improved 30 sec chair test to 5 reps  Baseline:  Goal status: INITIAL  5.  Patient able to get in and out of his recliner at home with good control and no LOB Baseline:  Goal status: INITIAL    PLAN:  PT FREQUENCY: 3x/week  PT DURATION: 8 weeks  PLANNED INTERVENTIONS: 97164- PT Re-evaluation, 97110-Therapeutic exercises, 97530- Therapeutic activity, 97112- Neuromuscular re-education, 97535- Self Care, 78295- Manual therapy, [phone]- Gait training, [phone]- Aquatic Therapy, 97014- Electrical stimulation (unattended), Patient/Family education, Balance training, Stair training, Taping, Joint mobilization, Spinal mobilization, DME instructions, Cryotherapy, and Moist  heat  PLAN FOR NEXT SESSION: continue general strengthening,  standing tolerance, balance, pt to bring padded gloves next time  Solon Palm, PT  07/23/23 12:19 PM Phone: [phone] Fax: [phone]

## 2023-07-24 ENCOUNTER — Ambulatory Visit: Payer: PPO | Admitting: Physical Therapy

## 2023-07-27 ENCOUNTER — Ambulatory Visit: Payer: PPO | Admitting: Physical Therapy

## 2023-07-27 ENCOUNTER — Encounter: Payer: Self-pay | Admitting: Physical Therapy

## 2023-07-27 DIAGNOSIS — M6281 Muscle weakness (generalized): Secondary | ICD-10-CM

## 2023-07-27 DIAGNOSIS — R262 Difficulty in walking, not elsewhere classified: Secondary | ICD-10-CM

## 2023-07-27 DIAGNOSIS — R2689 Other abnormalities of gait and mobility: Secondary | ICD-10-CM

## 2023-07-27 NOTE — Therapy (Signed)
OUTPATIENT PHYSICAL THERAPY LOWER EXTREMITY TREATMENT   Patient Name: Jeffrey [name] MRN: [id number] DOB:11-24-1955, 67 y.o., male Today's Date: 07/27/2023  END OF SESSION:  PT End of Session - 07/27/23 0927     Visit Number 6    Date for PT Re-Evaluation 08/27/23    Authorization Type HTA    Progress Note Due on Visit 10    PT Start Time 0845    PT Stop Time 0925    PT Time Calculation (min) 40 min    Activity Tolerance Patient tolerated treatment well    Behavior During Therapy [facility] for tasks assessed/performed                Past Medical History:  Diagnosis Date   Cardiac arrest ([name]) 05/22/2016   Cataract    CHF (congestive heart failure) ([name])    CKD (chronic kidney disease) stage 3, GFR 30-59 ml/min ([name]) 05/28/2021   CKD (chronic kidney disease) stage 4, GFR 15-29 ml/min ([name]) 05/28/2021   Diabetes mellitus without complication ([name])    type 2   Diabetic retinopathy ([name])    Hyperlipidemia    Hypertension    Memory loss    mild   Myocardial infarct ([name]) 2105   Retinopathy    Both eyes   S/P primary angioplasty with coronary stent 05/22/2016   Vitamin B12 deficiency    Vitreous hemorrhage of left eye ([name])    proliferative diabetic retinopathy   Past Surgical History:  Procedure Laterality Date   ANGIOPLASTY     2015   COLONOSCOPY     multiple   EYE SURGERY     PARS PLANA VITRECTOMY Left 03/15/2020   Procedure: PARS PLANA VITRECTOMY WITH 25 GAUGE;  Surgeon: Stephannie Li, MD;  Location: [facility] OR;  Service: Ophthalmology;  Laterality: Left;   PHOTOCOAGULATION WITH LASER Left 03/15/2020   Procedure: PHOTOCOAGULATION WITH LASER; INTRAVITREAL INJECTION OF AVASTIN;  Surgeon: Stephannie Li, MD;  Location: [facility] OR;  Service: Ophthalmology;  Laterality: Left;   REFRACTIVE SURGERY     UPPER GI ENDOSCOPY     several   VITRECTOMY     Patient Active Problem List   Diagnosis Date Noted   Rhinitis 05/20/2023   Cervical spondylosis 08/26/2022   CKD (chronic  kidney disease) stage 4, GFR 15-29 ml/min ([name]) 05/28/2021   Adjustment disorder 01/22/2021   Chronic back pain 10/22/2020   Peripheral vertigo 05/15/2020   Dermatitis 01/04/2020   Unintentional weight loss with loose stools 05/19/2019   Low vitamin B12 level 03/29/2019   Heme positive stool 03/14/2019   Normocytic anemia 03/14/2019   Chronic diastolic heart failure ([name]) 02/08/2019   Diabetic retinopathy ([name]) 09/08/2018   Physical debility 05/24/2018   Varicose veins of both lower extremities 03/01/2018   Fatigue due to exertion 10/14/2017   GERD (gastroesophageal reflux disease) 08/25/2017   CAD in native artery 04/14/2017   Hyperlipidemia associated with type 2 diabetes mellitus ([name]) 04/14/2017   Diabetic peripheral neuropathy associated with type 2 diabetes mellitus ([name]) 08/17/2016   [name] 05/02/2016   Hypertension associated with diabetes ([name]) 05/02/2016    PCP: Ardith Dark, MD   REFERRING PROVIDER: Ardith Dark, MD   REFERRING DIAG: R53.81 (ICD-10-CM) - Physical debility   THERAPY DIAG:  Muscle weakness (generalized)  Other abnormalities of gait and mobility  Difficulty in walking, not elsewhere classified  Rationale for Evaluation and Treatment: Rehabilitation  ONSET DATE: 2022  SUBJECTIVE:   SUBJECTIVE STATEMENT: Patient reports he  is doing okay today. He felt very tired after last treatment session and he had to use his transporter chair to get to the door when he got home.   From eval: Patient has been to two different therapy places and not had good experiences. One at [facility], one private practice. I want to be able to walk without the walker and I'm having trouble getting out of chairs without the walker. Presently sits in a dining chair. Can't sit in his recliner in his bedroom anymore. Wants to work on balance also. Joined [employer] which has a track and an elevator (on pause right now due to disability).  Periodically legs shake when he is walking.   Brother: [name]   PERTINENT HISTORY: Compression fx L1 2022, CAD, CKD (stage 4), DM, cardiac arrest 2017, neuropathy, retinopathy Long history of gait abnormality beginning back in 2022. He has a history of CAD and believes that his debility began following a covid infection. In December odf 2022 he fell and suffered a compression fracture of L1.  PAIN:  Are you having pain? No  PRECAUTIONS: Fall  RED FLAGS: None   WEIGHT BEARING RESTRICTIONS: No  FALLS:  Has patient fallen in last 6 months? No and with sit to stand falls back sometimes  LIVING ENVIRONMENT: Lives with: lives with their family Lives in: House/apartment Stairs: No Has following equipment at home: Single point cane, Environmental consultant - 2 wheeled, Environmental consultant - 4 wheeled, shower chair, Grab bars, and stand up walker also; also has shower chair at sink for shaving, brushing teeth etc, also has transporter.  OCCUPATION: retired  PLOF: Independent with household mobility with device  PATIENT GOALS: to walk without a walker  NEXT MD VISIT: 09/28/22  OBJECTIVE:  Note: Objective measures were completed at Evaluation unless otherwise noted.  DIAGNOSTIC FINDINGS: N/A  COGNITION: Overall cognitive status: Within functional limits for tasks assessed     SENSATION: Neuropathy in B feet   MUSCLE LENGTH: Marked HS R>L, hip flexor tightness   POSTURE: rounded shoulders and forward head  LOWER EXTREMITY ROM: WFL for tasks assessed  LOWER EXTREMITY MMT: *tested in hooklying  MMT Right eval Left eval  Hip flexion 4 4  Hip extension Able to bridge Able to bridge  Hip abduction 4-* 4-*  Hip adduction    Hip internal rotation    Hip external rotation    Knee flexion    Knee extension 4 4+  Ankle dorsiflexion 4+ 4-  Ankle plantarflexion    Ankle inversion    Ankle eversion     (Blank rows = not tested)   FUNCTIONAL TESTS:  30 seconds chair stand test Timed up and go (TUG):  59.36 with 4WW Stance time: 45 sec patient reports unsteadiness and feels he goes backwards 07/27/2023 92 ft decreased cadence; decreased step length; absent heel strike  GAIT: Distance walked: 25 Assistive device utilized: Walker - 4 wheeled Level of assistance: Modified independence Comments: slow speed, decreased step and stride, decreased heel strike, forward trunk lean.  TRANSFERS: Sit to stand: Uses walker to pull up; stand to sit: does not fully back up to chair and does not reach back; Supine to sit: needs min A for log roll  (has some back pain with sit <>supine due to sitting upward from supine) also fear of falling from bed, sit to supine needs min A to lift legs on to mat table  TODAY'S TREATMENT:  DATE:   07/27/23: NuStep Level 1 5 minutes- PT present to discuss status Airex Step ups x 10 each Weight shifts on airex x 1 minute  2 laps around gym 194 ft; 2 min seated rest break after each lap Standing heel raises at barre x 10  Standing marching at barre x 10 bilateral  Seated biceps curls 1# DB 2 x 10 Seated punches 1# DB x 10 bilateral   Seated LAQ no eight x 10 bilateral    07/22/23: NuStep Level 1 5 minutes- PT present to discuss status Seated LAQ 2# AW  x 10 each  Seated 2# ankle weight hip flexion/abduction to simulate lifting leg in/out of the car 10 each (very challenging) Standing heel raises x 10 Seated trunk extension medium pink power cord 15x Sit to stands 5x heavy arm use; purple cushion still needs heavy arm use Alternating UE reaching just above eye level 10x (pt needs single arm support at all times) Circles on the floor  touch toe to arc on floor heavy UE use on railing  6 inch Step taps x 10 bilateral heavy UE support on railing, SBA- CGA RPE 3-4/10 Gait with RW 60 feet with pt complaining of hand pain with heavy leaning  on arms (pt has gloves but does not have them today) Pt reports very fatigued especially in arms. Min assist transfer to facility W/C, pt's brother transported to the car  07/16/2023 NuStep Level 1 56 minutes- PT present to discuss status Went over tips for gait freezing  Standing hip abduction x 10 B at W/C  Patient became nauseous; sugar tested 178; pt then vomited so treatment ended.    PATIENT EDUCATION:  Education details: PT eval findings, anticipated POC, initial HEP, postural awareness, and transfer safety  Person educated: Patient and brother Education method: Explanation, Demonstration, and Handouts Education comprehension: verbalized understanding and returned demonstration  HOME EXERCISE PROGRAM: Access Code: [email or web address] URL: [email or web address] Date: 07/16/2023 Prepared by: Raynelle Fanning  Exercises - Supine Bridge  - 2 x daily - 7 x weekly - 1-3 sets - 10 reps - Sit to Stand with Counter Support  - 3 x daily - 3-4 x weekly - 1 sets - 1-5 reps - Hooklying Clamshell with Resistance  - 2 x daily - 3-4 x weekly - 1 sets - 10 reps - Seated Long Arc Quad  - 1 x daily - 7 x weekly - 1-3 sets - 10 reps - 5 sec hold - Standing Hip Abduction with Counter Support  - 1 x daily - 7 x weekly - 1-2 sets - 10 reps - Standing March with Counter Support  - 1 x daily - 7 x weekly - 1-2 sets - 10 reps - Standing Heel Raise with Support  - 1 x daily - 7 x weekly - 1-2 sets - 10 reps  Patient Education - Tips to reduce freezing episodes with standing or walking  ASSESSMENT:  CLINICAL IMPRESSION: Today's treatment session focused on balance and walking endurance. Airex balance was challenging for patient and he required UE support for step ups. He frequently caught his Rt foot on airex pad. When ambulating noted decreased cadence; decreased step length; absent heel strike. Patient required verbal cues for improved heel strike. Incorporated more standing activities today and patient  verbalized increased fatigue. Patient will benefit from skilled PT to address the below impairments and improve overall function.    OBJECTIVE IMPAIRMENTS: Abnormal gait, decreased balance, decreased ROM, decreased strength, decreased safety awareness, dizziness, impaired  flexibility, impaired sensation, postural dysfunction, and pain.   ACTIVITY LIMITATIONS: carrying, lifting, bending, standing, squatting, stairs, transfers, bed mobility, continence, bathing, and locomotion level  PARTICIPATION LIMITATIONS: shopping and community activity  PERSONAL FACTORS: Age, Fitness, Time since onset of injury/illness/exacerbation, and 3+ comorbidities: Compression fx L1 2022, CAD, CKD (stage 4), neuropathy, retinopathy  are also affecting patient's functional outcome.   REHAB POTENTIAL: Good  CLINICAL DECISION MAKING: Unstable/unpredictable  EVALUATION COMPLEXITY: High   GOALS: Goals reviewed with patient? Yes  SHORT TERM GOALS: Target date: 07/30/23 Improved TUG to < 25 sec to decrease fall risk.  Baseline: Goal status: INITIAL  2.  Able to perform 5XSTS without UE assist and controlled descent showing improved LE strength.  Baseline:  Goal status: INITIAL  3.  Patient will be able to stand safely for 2 min without UE assist Baseline:  Goal status: INITIAL   LONG TERM GOALS: Target date: 08/27/23  Ind with advanced HEP Baseline:  Goal status: INITIAL  2. Patient will be able to ambulate 600' with LRAD with good safety to access community.  Baseline:  Goal status: INITIAL  3.  Patient able to perform bed mobility with SBA Baseline:  Goal status: INITIAL  4.  Improved 30 sec chair test to 5 reps  Baseline:  Goal status: INITIAL  5.  Patient able to get in and out of his recliner at home with good control and no LOB Baseline:  Goal status: INITIAL    PLAN:  PT FREQUENCY: 3x/week  PT DURATION: 8 weeks  PLANNED INTERVENTIONS: 97164- PT Re-evaluation,  97110-Therapeutic exercises, 97530- Therapeutic activity, 97112- Neuromuscular re-education, 97535- Self Care, 40347- Manual therapy, [phone]- Gait training, [phone]- Aquatic Therapy, 97014- Electrical stimulation (unattended), Patient/Family education, Balance training, Stair training, Taping, Joint mobilization, Spinal mobilization, DME instructions, Cryotherapy, and Moist heat  PLAN FOR NEXT SESSION: stair negotiation first thing; continue working on standing tolerance & balance  Claude Manges, PT 07/27/23 9:28 AM

## 2023-07-28 ENCOUNTER — Encounter: Payer: PPO | Admitting: Physical Therapy

## 2023-07-30 ENCOUNTER — Ambulatory Visit: Payer: PPO | Admitting: Physical Therapy

## 2023-07-30 DIAGNOSIS — R262 Difficulty in walking, not elsewhere classified: Secondary | ICD-10-CM

## 2023-07-30 DIAGNOSIS — R2689 Other abnormalities of gait and mobility: Secondary | ICD-10-CM

## 2023-07-30 DIAGNOSIS — M6281 Muscle weakness (generalized): Secondary | ICD-10-CM | POA: Diagnosis not present

## 2023-07-30 NOTE — Therapy (Signed)
OUTPATIENT PHYSICAL THERAPY LOWER EXTREMITY TREATMENT   Patient Name: Jeffrey [name] MRN: [id number] DOB:Sep 10, 1955, 67 y.o., male Today's Date: 07/30/2023  END OF SESSION:  PT End of Session - 07/30/23 1145     Visit Number 7    Date for PT Re-Evaluation 08/27/23    Authorization Type HTA    Progress Note Due on Visit 10    PT Start Time 1147    PT Stop Time 1230    PT Time Calculation (min) 43 min    Equipment Utilized During Treatment Gait belt    Activity Tolerance Patient tolerated treatment well                Past Medical History:  Diagnosis Date   Cardiac arrest ([name]) 05/22/2016   Cataract    CHF (congestive heart failure) ([name])    CKD (chronic kidney disease) stage 3, GFR 30-59 ml/min ([name]) 05/28/2021   CKD (chronic kidney disease) stage 4, GFR 15-29 ml/min ([name]) 05/28/2021   Diabetes mellitus without complication ([name])    type 2   Diabetic retinopathy ([name])    Hyperlipidemia    Hypertension    Memory loss    mild   Myocardial infarct ([name]) 2105   Retinopathy    Both eyes   S/P primary angioplasty with coronary stent 05/22/2016   Vitamin B12 deficiency    Vitreous hemorrhage of left eye ([name])    proliferative diabetic retinopathy   Past Surgical History:  Procedure Laterality Date   ANGIOPLASTY     2015   COLONOSCOPY     multiple   EYE SURGERY     PARS PLANA VITRECTOMY Left 03/15/2020   Procedure: PARS PLANA VITRECTOMY WITH 25 GAUGE;  Surgeon: Stephannie Li, MD;  Location: [facility] OR;  Service: Ophthalmology;  Laterality: Left;   PHOTOCOAGULATION WITH LASER Left 03/15/2020   Procedure: PHOTOCOAGULATION WITH LASER; INTRAVITREAL INJECTION OF AVASTIN;  Surgeon: Stephannie Li, MD;  Location: [facility] OR;  Service: Ophthalmology;  Laterality: Left;   REFRACTIVE SURGERY     UPPER GI ENDOSCOPY     several   VITRECTOMY     Patient Active Problem List   Diagnosis Date Noted   Rhinitis 05/20/2023   Cervical spondylosis 08/26/2022   CKD (chronic kidney  disease) stage 4, GFR 15-29 ml/min ([name]) 05/28/2021   Adjustment disorder 01/22/2021   Chronic back pain 10/22/2020   Peripheral vertigo 05/15/2020   Dermatitis 01/04/2020   Unintentional weight loss with loose stools 05/19/2019   Low vitamin B12 level 03/29/2019   Heme positive stool 03/14/2019   Normocytic anemia 03/14/2019   Chronic diastolic heart failure ([name]) 02/08/2019   Diabetic retinopathy ([name]) 09/08/2018   Physical debility 05/24/2018   Varicose veins of both lower extremities 03/01/2018   Fatigue due to exertion 10/14/2017   GERD (gastroesophageal reflux disease) 08/25/2017   CAD in native artery 04/14/2017   Hyperlipidemia associated with type 2 diabetes mellitus ([name]) 04/14/2017   Diabetic peripheral neuropathy associated with type 2 diabetes mellitus ([name]) 08/17/2016   [name] 05/02/2016   Hypertension associated with diabetes ([name]) 05/02/2016    PCP: Ardith Dark, MD   REFERRING PROVIDER: Ardith Dark, MD   REFERRING DIAG: R53.81 (ICD-10-CM) - Physical debility   THERAPY DIAG:  Muscle weakness (generalized)  Other abnormalities of gait and mobility  Difficulty in walking, not elsewhere classified  Rationale for Evaluation and Treatment: Rehabilitation  ONSET DATE: 2022  SUBJECTIVE:   SUBJECTIVE STATEMENT: Not overly tired after  last visit. Difficulty getting out of the recliner.  Switched to a different chair to watch TV in.  Hard to move in bed and push up in the bed. Patient states, "I'd like to do some weight machines today."    From eval: Patient has been to two different therapy places and not had good experiences. One at [facility], one private practice. I want to be able to walk without the walker and I'm having trouble getting out of chairs without the walker. Presently sits in a dining chair. Can't sit in his recliner in his bedroom anymore. Wants to work on balance also. Joined [employer] which has a track and an  elevator (on pause right now due to disability). Periodically legs shake when he is walking.   Brother: [name]   PERTINENT HISTORY: Compression fx L1 2022, CAD, CKD (stage 4), DM, cardiac arrest 2017, neuropathy, retinopathy Long history of gait abnormality beginning back in 2022. He has a history of CAD and believes that his debility began following a covid infection. In December odf 2022 he fell and suffered a compression fracture of L1.  PAIN:  Are you having pain? No  PRECAUTIONS: Fall  RED FLAGS: None   WEIGHT BEARING RESTRICTIONS: No  FALLS:  Has patient fallen in last 6 months? No and with sit to stand falls back sometimes  LIVING ENVIRONMENT: Lives with: lives with their family Lives in: House/apartment Stairs: No Has following equipment at home: Single point cane, Environmental consultant - 2 wheeled, Environmental consultant - 4 wheeled, shower chair, Grab bars, and stand up walker also; also has shower chair at sink for shaving, brushing teeth etc, also has transporter.  OCCUPATION: retired  PLOF: Independent with household mobility with device  PATIENT GOALS: to walk without a walker  NEXT MD VISIT: 09/28/22  OBJECTIVE:  Note: Objective measures were completed at Evaluation unless otherwise noted.  DIAGNOSTIC FINDINGS: N/A  COGNITION: Overall cognitive status: Within functional limits for tasks assessed     SENSATION: Neuropathy in B feet   MUSCLE LENGTH: Marked HS R>L, hip flexor tightness   POSTURE: rounded shoulders and forward head  LOWER EXTREMITY ROM: WFL for tasks assessed  LOWER EXTREMITY MMT: *tested in hooklying  MMT Right eval Left eval  Hip flexion 4 4  Hip extension Able to bridge Able to bridge  Hip abduction 4-* 4-*  Hip adduction    Hip internal rotation    Hip external rotation    Knee flexion    Knee extension 4 4+  Ankle dorsiflexion 4+ 4-  Ankle plantarflexion    Ankle inversion    Ankle eversion     (Blank rows = not tested)   FUNCTIONAL TESTS:  30  seconds chair stand test Timed up and go (TUG): 59.36 with 4WW Stance time: 45 sec patient reports unsteadiness and feels he goes backwards 07/27/2023 92 ft decreased cadence; decreased step length; absent heel strike  GAIT: Distance walked: 25 Assistive device utilized: Walker - 4 wheeled Level of assistance: Modified independence Comments: slow speed, decreased step and stride, decreased heel strike, forward trunk lean.  TRANSFERS: Sit to stand: Uses walker to pull up; stand to sit: does not fully back up to chair and does not reach back; Supine to sit: needs min A for log roll  (has some back pain with sit <>supine due to sitting upward from supine) also fear of falling from bed, sit to supine needs min A to lift legs on to mat table  TODAY'S TREATMENT:  DATE:   07/30/23: NuStep Level 1 5 minutes- PT present to discuss status Gait belt on for duration of session Leg press seat 7 60# bil 10x; 30# single leg 10x right/left ( "I like this") able to move Les on and off the footplate without assist and rise from the seat using footplate to pull up from  Cable pulls 15# seated  2 sets of 10("I like this one too") Seated HS pink power cord and slider 10x right/left Seated cable overhead lat pulls level 15 15x 15# Seated 5# kettlebell reach down between feet 10x right/left; pt requests to try 10# 8x right/left   07/27/23: NuStep Level 1 5 minutes- PT present to discuss status Airex Step ups x 10 each Weight shifts on airex x 1 minute  2 laps around gym 194 ft; 2 min seated rest break after each lap Standing heel raises at barre x 10  Standing marching at barre x 10 bilateral  Seated biceps curls 1# DB 2 x 10 Seated punches 1# DB x 10 bilateral   Seated LAQ no eight x 10 bilateral    07/22/23: NuStep Level 1 5 minutes- PT present to discuss status Seated  LAQ 2# AW  x 10 each  Seated 2# ankle weight hip flexion/abduction to simulate lifting leg in/out of the car 10 each (very challenging) Standing heel raises x 10 Seated trunk extension medium pink power cord 15x Sit to stands 5x heavy arm use; purple cushion still needs heavy arm use Alternating UE reaching just above eye level 10x (pt needs single arm support at all times) Circles on the floor  touch toe to arc on floor heavy UE use on railing  6 inch Step taps x 10 bilateral heavy UE support on railing, SBA- CGA RPE 3-4/10 Gait with RW 60 feet with pt complaining of hand pain with heavy leaning on arms (pt has gloves but does not have them today) Pt reports very fatigued especially in arms. Min assist transfer to facility W/C, pt's brother transported to the car  07/16/2023 NuStep Level 1 56 minutes- PT present to discuss status Went over tips for gait freezing  Standing hip abduction x 10 B at W/C  Patient became nauseous; sugar tested 178; pt then vomited so treatment ended.    PATIENT EDUCATION:  Education details: PT eval findings, anticipated POC, initial HEP, postural awareness, and transfer safety  Person educated: Patient and brother Education method: Explanation, Demonstration, and Handouts Education comprehension: verbalized understanding and returned demonstration  HOME EXERCISE PROGRAM: Access Code: [email or web address] URL: [email or web address] Date: 07/16/2023 Prepared by: Raynelle Fanning  Exercises - Supine Bridge  - 2 x daily - 7 x weekly - 1-3 sets - 10 reps - Sit to Stand with Counter Support  - 3 x daily - 3-4 x weekly - 1 sets - 1-5 reps - Hooklying Clamshell with Resistance  - 2 x daily - 3-4 x weekly - 1 sets - 10 reps - Seated Long Arc Quad  - 1 x daily - 7 x weekly - 1-3 sets - 10 reps - 5 sec hold - Standing Hip Abduction with Counter Support  - 1 x daily - 7 x weekly - 1-2 sets - 10 reps - Standing March with Counter Support  - 1 x daily - 7 x weekly - 1-2  sets - 10 reps - Standing Heel Raise with Support  - 1 x daily - 7 x weekly - 1-2 sets - 10 reps  Patient Education - Tips to  reduce freezing episodes with standing or walking  ASSESSMENT:  CLINICAL IMPRESSION: Patient requests to try some of the resistance machines today and states "I love it!"  Strengthening Les, trunk and Ues will be beneficial for functional limitations include rising from the chair, scooting and turning in bed and walking with less lean on the RW.  His long term goal is to go with his brother to the gym.  Therapist closely monitoring response including excessive fatigue.  He denies feeling overly tired at the end of session and is able to walk to the bathroom and out of the clinic ambulating (rather than by wheelchair/transport chair).    OBJECTIVE IMPAIRMENTS: Abnormal gait, decreased balance, decreased ROM, decreased strength, decreased safety awareness, dizziness, impaired flexibility, impaired sensation, postural dysfunction, and pain.   ACTIVITY LIMITATIONS: carrying, lifting, bending, standing, squatting, stairs, transfers, bed mobility, continence, bathing, and locomotion level  PARTICIPATION LIMITATIONS: shopping and community activity  PERSONAL FACTORS: Age, Fitness, Time since onset of injury/illness/exacerbation, and 3+ comorbidities: Compression fx L1 2022, CAD, CKD (stage 4), neuropathy, retinopathy  are also affecting patient's functional outcome.   REHAB POTENTIAL: Good  CLINICAL DECISION MAKING: Unstable/unpredictable  EVALUATION COMPLEXITY: High   GOALS: Goals reviewed with patient? Yes  SHORT TERM GOALS: Target date: 07/30/23 Improved TUG to < 25 sec to decrease fall risk.  Baseline: Goal status: INITIAL  2.  Able to perform 5XSTS without UE assist and controlled descent showing improved LE strength.  Baseline:  Goal status: INITIAL  3.  Patient will be able to stand safely for 2 min without UE assist Baseline:  Goal status:  INITIAL   LONG TERM GOALS: Target date: 08/27/23  Ind with advanced HEP Baseline:  Goal status: INITIAL  2. Patient will be able to ambulate 600' with LRAD with good safety to access community.  Baseline:  Goal status: INITIAL  3.  Patient able to perform bed mobility with SBA Baseline:  Goal status: INITIAL  4.  Improved 30 sec chair test to 5 reps  Baseline:  Goal status: INITIAL  5.  Patient able to get in and out of his recliner at home with good control and no LOB Baseline:  Goal status: INITIAL    PLAN:  PT FREQUENCY: 3x/week  PT DURATION: 8 weeks  PLANNED INTERVENTIONS: 97164- PT Re-evaluation, 97110-Therapeutic exercises, 97530- Therapeutic activity, 97112- Neuromuscular re-education, 97535- Self Care, 14782- Manual therapy, [phone]- Gait training, [phone]- Aquatic Therapy, 97014- Electrical stimulation (unattended), Patient/Family education, Balance training, Stair training, Taping, Joint mobilization, Spinal mobilization, DME instructions, Cryotherapy, and Moist heat  PLAN FOR NEXT SESSION: assess response to strength training today: leg press, cable pulls, kettlebell; stair negotiation first thing; continue working on standing tolerance & balance  Lavinia Sharps, PT 07/30/23 8:18 PM Phone: [phone] Fax: [phone]

## 2023-07-31 ENCOUNTER — Encounter: Payer: PPO | Admitting: Physical Therapy

## 2023-08-01 ENCOUNTER — Other Ambulatory Visit: Payer: Self-pay | Admitting: Family Medicine

## 2023-08-04 ENCOUNTER — Encounter: Payer: PPO | Admitting: Physical Therapy

## 2023-08-06 ENCOUNTER — Ambulatory Visit: Payer: PPO | Admitting: Physical Therapy

## 2023-08-06 DIAGNOSIS — M6281 Muscle weakness (generalized): Secondary | ICD-10-CM | POA: Diagnosis not present

## 2023-08-06 DIAGNOSIS — R2689 Other abnormalities of gait and mobility: Secondary | ICD-10-CM

## 2023-08-06 DIAGNOSIS — R262 Difficulty in walking, not elsewhere classified: Secondary | ICD-10-CM

## 2023-08-06 NOTE — Therapy (Signed)
OUTPATIENT PHYSICAL THERAPY LOWER EXTREMITY TREATMENT   Patient Name: Jeffrey [name] MRN: [id number] DOB:09/16/1955, 67 y.o., male Today's Date: 08/06/2023  END OF SESSION:  PT End of Session - 08/06/23 1100     Visit Number 8    Date for PT Re-Evaluation 08/27/23    Authorization Type HTA    Progress Note Due on Visit 10    PT Start Time 1100    PT Stop Time 1140    PT Time Calculation (min) 40 min    Equipment Utilized During Treatment Gait belt    Activity Tolerance Patient tolerated treatment well                Past Medical History:  Diagnosis Date   Cardiac arrest ([name]) 05/22/2016   Cataract    CHF (congestive heart failure) ([name])    CKD (chronic kidney disease) stage 3, GFR 30-59 ml/min ([name]) 05/28/2021   CKD (chronic kidney disease) stage 4, GFR 15-29 ml/min ([name]) 05/28/2021   Diabetes mellitus without complication ([name])    type 2   Diabetic retinopathy ([name])    Hyperlipidemia    Hypertension    Memory loss    mild   Myocardial infarct ([name]) 2105   Retinopathy    Both eyes   S/P primary angioplasty with coronary stent 05/22/2016   Vitamin B12 deficiency    Vitreous hemorrhage of left eye ([name])    proliferative diabetic retinopathy   Past Surgical History:  Procedure Laterality Date   ANGIOPLASTY     2015   COLONOSCOPY     multiple   EYE SURGERY     PARS PLANA VITRECTOMY Left 03/15/2020   Procedure: PARS PLANA VITRECTOMY WITH 25 GAUGE;  Surgeon: Stephannie Li, MD;  Location: [facility] OR;  Service: Ophthalmology;  Laterality: Left;   PHOTOCOAGULATION WITH LASER Left 03/15/2020   Procedure: PHOTOCOAGULATION WITH LASER; INTRAVITREAL INJECTION OF AVASTIN;  Surgeon: Stephannie Li, MD;  Location: [facility] OR;  Service: Ophthalmology;  Laterality: Left;   REFRACTIVE SURGERY     UPPER GI ENDOSCOPY     several   VITRECTOMY     Patient Active Problem List   Diagnosis Date Noted   Rhinitis 05/20/2023   Cervical spondylosis 08/26/2022   CKD (chronic kidney  disease) stage 4, GFR 15-29 ml/min ([name]) 05/28/2021   Adjustment disorder 01/22/2021   Chronic back pain 10/22/2020   Peripheral vertigo 05/15/2020   Dermatitis 01/04/2020   Unintentional weight loss with loose stools 05/19/2019   Low vitamin B12 level 03/29/2019   Heme positive stool 03/14/2019   Normocytic anemia 03/14/2019   Chronic diastolic heart failure ([name]) 02/08/2019   Diabetic retinopathy ([name]) 09/08/2018   Physical debility 05/24/2018   Varicose veins of both lower extremities 03/01/2018   Fatigue due to exertion 10/14/2017   GERD (gastroesophageal reflux disease) 08/25/2017   CAD in native artery 04/14/2017   Hyperlipidemia associated with type 2 diabetes mellitus ([name]) 04/14/2017   Diabetic peripheral neuropathy associated with type 2 diabetes mellitus ([name]) 08/17/2016   [name] 05/02/2016   Hypertension associated with diabetes ([name]) 05/02/2016    PCP: Ardith Dark, MD   REFERRING PROVIDER: Ardith Dark, MD   REFERRING DIAG: R53.81 (ICD-10-CM) - Physical debility   THERAPY DIAG:  Muscle weakness (generalized)  Other abnormalities of gait and mobility  Difficulty in walking, not elsewhere classified  Rationale for Evaluation and Treatment: Rehabilitation  ONSET DATE: 2022  SUBJECTIVE:   SUBJECTIVE STATEMENT: I liked the machines  last time.  Reports he wasn't overly sore or fatigued after last session.   From eval: Patient has been to two different therapy places and not had good experiences. One at [facility], one private practice. I want to be able to walk without the walker and I'm having trouble getting out of chairs without the walker. Presently sits in a dining chair. Can't sit in his recliner in his bedroom anymore. Wants to work on balance also. Joined [employer] which has a track and an elevator (on pause right now due to disability). Periodically legs shake when he is walking.   Brother: [name]   PERTINENT  HISTORY: Compression fx L1 2022, CAD, CKD (stage 4), DM, cardiac arrest 2017, neuropathy, retinopathy Long history of gait abnormality beginning back in 2022. He has a history of CAD and believes that his debility began following a covid infection. In December odf 2022 he fell and suffered a compression fracture of L1.  PAIN:  Are you having pain? No  PRECAUTIONS: Fall  RED FLAGS: None   WEIGHT BEARING RESTRICTIONS: No  FALLS:  Has patient fallen in last 6 months? No and with sit to stand falls back sometimes  LIVING ENVIRONMENT: Lives with: lives with their family Lives in: House/apartment Stairs: No Has following equipment at home: Single point cane, Environmental consultant - 2 wheeled, Environmental consultant - 4 wheeled, shower chair, Grab bars, and stand up walker also; also has shower chair at sink for shaving, brushing teeth etc, also has transporter.  OCCUPATION: retired  PLOF: Independent with household mobility with device  PATIENT GOALS: to walk without a walker  NEXT MD VISIT: 09/28/22  OBJECTIVE:  Note: Objective measures were completed at Evaluation unless otherwise noted.  DIAGNOSTIC FINDINGS: N/A  COGNITION: Overall cognitive status: Within functional limits for tasks assessed     SENSATION: Neuropathy in B feet   MUSCLE LENGTH: Marked HS R>L, hip flexor tightness   POSTURE: rounded shoulders and forward head  LOWER EXTREMITY ROM: WFL for tasks assessed  LOWER EXTREMITY MMT: *tested in hooklying  MMT Right eval Left eval  Hip flexion 4 4  Hip extension Able to bridge Able to bridge  Hip abduction 4-* 4-*  Hip adduction    Hip internal rotation    Hip external rotation    Knee flexion    Knee extension 4 4+  Ankle dorsiflexion 4+ 4-  Ankle plantarflexion    Ankle inversion    Ankle eversion     (Blank rows = not tested)   FUNCTIONAL TESTS:  30 seconds chair stand test Timed up and go (TUG): 59.36 with 4WW Stance time: 45 sec patient reports unsteadiness and feels  he goes backwards 07/27/2023 92 ft decreased cadence; decreased step length; absent heel strike  12/26: 5x STS: unable to rise without heavy UE use; attempted to use armrests for test but does not come all the way up;  next trial with chair +cushion to touch at least one RW handle 48.2 sec:   TUG: cushion in seat +arm use and RW:  52.49 sec  GAIT: Distance walked: 25 Assistive device utilized: Environmental consultant - 4 wheeled Level of assistance: Modified independence Comments: slow speed, decreased step and stride, decreased heel strike, forward trunk lean.  TRANSFERS: Sit to stand: Uses walker to pull up; stand to sit: does not fully back up to chair and does not reach back; Supine to sit: needs min A for log roll  (has some back pain with sit <>supine due to sitting  upward from supine) also fear of falling from bed, sit to supine needs min A to lift legs on to mat table  TODAY'S TREATMENT:                                                                                                                              DATE:   08/06/23: NuStep Level 1 5 minutes- PT present to discuss status Gait belt on during session Leg press (able to stand and lift leg over)seat 7 (extra recline) 65# bil 10x (2nd set AMRAP of 10 reps); 30# single leg 10x right/left (cues to control descent) able to move Les on and off the footplate without assist and rise from the seat using footplate to pull up from  Trip to the bathroom: seems fatigued on return and needs a short rest break 5x STS as above TUG Cable pulls 15# seated 10x Seated purple heavy power band stomps 7x right/left Seated cable overhead lat pulls level 17 10x 2    15# Pt reports his chairs at home don't have armrests; pt interested in foam cushion to sit on like we have in the clinic and was given info   07/30/23: NuStep Level 1 5 minutes- PT present to discuss status Gait belt on for duration of session Leg press seat 7 60# bil 10x; 30# single leg  10x right/left ( "I like this") able to move Les on and off the footplate without assist and rise from the seat using footplate to pull up from  [place] pulls 15# seated  2 sets of 10("I like this one too") Seated HS pink power cord and slider 10x right/left Seated cable overhead lat pulls level 15 15x 15# Seated 5# kettlebell reach down between feet 10x right/left; pt requests to try 10# 8x right/left   07/27/23: NuStep Level 1 5 minutes- PT present to discuss status Airex Step ups x 10 each Weight shifts on airex x 1 minute  2 laps around gym 194 ft; 2 min seated rest break after each lap Standing heel raises at barre x 10  Standing marching at barre x 10 bilateral  Seated biceps curls 1# DB 2 x 10 Seated punches 1# DB x 10 bilateral   Seated LAQ no eight x 10 bilateral    07/22/23: NuStep Level 1 5 minutes- PT present to discuss status Seated LAQ 2# AW  x 10 each  Seated 2# ankle weight hip flexion/abduction to simulate lifting leg in/out of the car 10 each (very challenging) Standing heel raises x 10 Seated trunk extension medium pink power cord 15x Sit to stands 5x heavy arm use; purple cushion still needs heavy arm use Alternating UE reaching just above eye level 10x (pt needs single arm support at all times) Circles on the floor  touch toe to arc on floor heavy UE use on railing  6 inch Step taps x 10 bilateral heavy UE support on railing, SBA- CGA RPE 3-4/10 Gait  with RW 60 feet with pt complaining of hand pain with heavy leaning on arms (pt has gloves but does not have them today) Pt reports very fatigued especially in arms. Min assist transfer to facility W/C, pt's brother transported to the car     PATIENT EDUCATION:  Education details: PT eval findings, anticipated POC, initial HEP, postural awareness, and transfer safety  Person educated: Patient and brother Education method: Explanation, Demonstration, and Handouts Education comprehension: verbalized understanding  and returned demonstration  HOME EXERCISE PROGRAM: Access Code: [email or web address] URL: [email or web address] Date: 07/16/2023 Prepared by: Raynelle Fanning  Exercises - Supine Bridge  - 2 x daily - 7 x weekly - 1-3 sets - 10 reps - Sit to Stand with Counter Support  - 3 x daily - 3-4 x weekly - 1 sets - 1-5 reps - Hooklying Clamshell with Resistance  - 2 x daily - 3-4 x weekly - 1 sets - 10 reps - Seated Long Arc Quad  - 1 x daily - 7 x weekly - 1-3 sets - 10 reps - 5 sec hold - Standing Hip Abduction with Counter Support  - 1 x daily - 7 x weekly - 1-2 sets - 10 reps - Standing March with Counter Support  - 1 x daily - 7 x weekly - 1-2 sets - 10 reps - Standing Heel Raise with Support  - 1 x daily - 7 x weekly - 1-2 sets - 10 reps  Patient Education - Tips to reduce freezing episodes with standing or walking  ASSESSMENT:  CLINICAL IMPRESSION: Although the patient has not met STGS, he is progressing with physical therapy.  He lacks LE strength to rise from a chair without significant UE use however we have recently initiated the leg press which should help with this functional task over time.  He is dependent on 4 wheeled walker on a full time basis with moderate lean on his arms and is unable to stand without at least one arm holding on.  His TUG has improved since start of care.  CGA/close supervision for safety with gait and transfers. Therapist monitoring response throughout treatment session.   OBJECTIVE IMPAIRMENTS: Abnormal gait, decreased balance, decreased ROM, decreased strength, decreased safety awareness, dizziness, impaired flexibility, impaired sensation, postural dysfunction, and pain.   ACTIVITY LIMITATIONS: carrying, lifting, bending, standing, squatting, stairs, transfers, bed mobility, continence, bathing, and locomotion level  PARTICIPATION LIMITATIONS: shopping and community activity  PERSONAL FACTORS: Age, Fitness, Time since onset of injury/illness/exacerbation, and  3+ comorbidities: Compression fx L1 2022, CAD, CKD (stage 4), neuropathy, retinopathy  are also affecting patient's functional outcome.   REHAB POTENTIAL: Good  CLINICAL DECISION MAKING: Unstable/unpredictable  EVALUATION COMPLEXITY: High   GOALS: Goals reviewed with patient? Yes  SHORT TERM GOALS: Target date: 07/30/23 Improved TUG to < 25 sec to decrease fall risk.  Baseline: Goal status: ongoing  2.  Able to perform 5XSTS without UE assist and controlled descent showing improved LE strength.  Baseline:  Goal status: ongoing  3.  Patient will be able to stand safely for 2 min without UE assist Baseline:  Goal status: ongoing  LONG TERM GOALS: Target date: 08/27/23  Ind with advanced HEP Baseline:  Goal status: INITIAL  2. Patient will be able to ambulate 600' with LRAD with good safety to access community.  Baseline:  Goal status: INITIAL  3.  Patient able to perform bed mobility with SBA Baseline:  Goal status: INITIAL  4.  Improved 30 sec chair test to 5 reps  Baseline:  Goal status: INITIAL  5.  Patient able to get in and out of his recliner at home with good control and no LOB Baseline:  Goal status: INITIAL    PLAN:  PT FREQUENCY: 3x/week  PT DURATION: 8 weeks  PLANNED INTERVENTIONS: 97164- PT Re-evaluation, 97110-Therapeutic exercises, 97530- Therapeutic activity, 97112- Neuromuscular re-education, 97535- Self Care, 16109- Manual therapy, [phone]- Gait training, [phone]- Aquatic Therapy, 97014- Electrical stimulation (unattended), Patient/Family education, Balance training, Stair training, Taping, Joint mobilization, Spinal mobilization, DME instructions, Cryotherapy, and Moist heat  PLAN FOR NEXT SESSION: pt prefers "machines" versus bands; leg press, cable pulls, kettlebell;  continue working on standing tolerance & balance  Lavinia Sharps, PT 08/06/23 5:34 PM Phone: [phone] Fax: [phone]

## 2023-08-10 ENCOUNTER — Ambulatory Visit: Payer: PPO | Admitting: Physical Therapy

## 2023-08-10 ENCOUNTER — Encounter: Payer: Self-pay | Admitting: Physical Therapy

## 2023-08-10 DIAGNOSIS — R262 Difficulty in walking, not elsewhere classified: Secondary | ICD-10-CM

## 2023-08-10 DIAGNOSIS — M6281 Muscle weakness (generalized): Secondary | ICD-10-CM | POA: Diagnosis not present

## 2023-08-10 DIAGNOSIS — R2689 Other abnormalities of gait and mobility: Secondary | ICD-10-CM

## 2023-08-10 NOTE — Therapy (Signed)
OUTPATIENT PHYSICAL THERAPY LOWER EXTREMITY TREATMENT   Patient Name: Jeffrey [name] MRN: [id number] DOB:Dec 25, 1955, 67 y.o., male Today's Date: 08/10/2023  END OF SESSION:  PT End of Session - 08/10/23 1545     Visit Number 9    Date for PT Re-Evaluation 08/27/23    Authorization Type HTA    Progress Note Due on Visit 10    PT Start Time 1446    PT Stop Time 1530    PT Time Calculation (min) 44 min    Activity Tolerance Patient tolerated treatment well    Behavior During Therapy [facility] for tasks assessed/performed                 Past Medical History:  Diagnosis Date   Cardiac arrest ([name]) 05/22/2016   Cataract    CHF (congestive heart failure) ([name])    CKD (chronic kidney disease) stage 3, GFR 30-59 ml/min ([name]) 05/28/2021   CKD (chronic kidney disease) stage 4, GFR 15-29 ml/min ([name]) 05/28/2021   Diabetes mellitus without complication ([name])    type 2   Diabetic retinopathy ([name])    Hyperlipidemia    Hypertension    Memory loss    mild   Myocardial infarct ([name]) 2105   Retinopathy    Both eyes   S/P primary angioplasty with coronary stent 05/22/2016   Vitamin B12 deficiency    Vitreous hemorrhage of left eye ([name])    proliferative diabetic retinopathy   Past Surgical History:  Procedure Laterality Date   ANGIOPLASTY     2015   COLONOSCOPY     multiple   EYE SURGERY     PARS PLANA VITRECTOMY Left 03/15/2020   Procedure: PARS PLANA VITRECTOMY WITH 25 GAUGE;  Surgeon: Stephannie Li, MD;  Location: [facility] OR;  Service: Ophthalmology;  Laterality: Left;   PHOTOCOAGULATION WITH LASER Left 03/15/2020   Procedure: PHOTOCOAGULATION WITH LASER; INTRAVITREAL INJECTION OF AVASTIN;  Surgeon: Stephannie Li, MD;  Location: [facility] OR;  Service: Ophthalmology;  Laterality: Left;   REFRACTIVE SURGERY     UPPER GI ENDOSCOPY     several   VITRECTOMY     Patient Active Problem List   Diagnosis Date Noted   Rhinitis 05/20/2023   Cervical spondylosis 08/26/2022   CKD (chronic  kidney disease) stage 4, GFR 15-29 ml/min ([name]) 05/28/2021   Adjustment disorder 01/22/2021   Chronic back pain 10/22/2020   Peripheral vertigo 05/15/2020   Dermatitis 01/04/2020   Unintentional weight loss with loose stools 05/19/2019   Low vitamin B12 level 03/29/2019   Heme positive stool 03/14/2019   Normocytic anemia 03/14/2019   Chronic diastolic heart failure ([name]) 02/08/2019   Diabetic retinopathy ([name]) 09/08/2018   Physical debility 05/24/2018   Varicose veins of both lower extremities 03/01/2018   Fatigue due to exertion 10/14/2017   GERD (gastroesophageal reflux disease) 08/25/2017   CAD in native artery 04/14/2017   Hyperlipidemia associated with type 2 diabetes mellitus ([name]) 04/14/2017   Diabetic peripheral neuropathy associated with type 2 diabetes mellitus ([name]) 08/17/2016   [name] 05/02/2016   Hypertension associated with diabetes ([name]) 05/02/2016    PCP: Ardith Dark, MD   REFERRING PROVIDER: Ardith Dark, MD   REFERRING DIAG: R53.81 (ICD-10-CM) - Physical debility   THERAPY DIAG:  Muscle weakness (generalized)  Other abnormalities of gait and mobility  Difficulty in walking, not elsewhere classified  Rationale for Evaluation and Treatment: Rehabilitation  ONSET DATE: 2022  SUBJECTIVE:   SUBJECTIVE STATEMENT: I feel  very weak today. For the past three days I have felt week and my arms feel very sore.  From eval: Patient has been to two different therapy places and not had good experiences. One at [facility], one private practice. I want to be able to walk without the walker and I'm having trouble getting out of chairs without the walker. Presently sits in a dining chair. Can't sit in his recliner in his bedroom anymore. Wants to work on balance also. Joined [employer] which has a track and an elevator (on pause right now due to disability). Periodically legs shake when he is walking.   Brother: [name]   PERTINENT  HISTORY: Compression fx L1 2022, CAD, CKD (stage 4), DM, cardiac arrest 2017, neuropathy, retinopathy Long history of gait abnormality beginning back in 2022. He has a history of CAD and believes that his debility began following a covid infection. In December odf 2022 he fell and suffered a compression fracture of L1.  PAIN:  Are you having pain? No  PRECAUTIONS: Fall  RED FLAGS: None   WEIGHT BEARING RESTRICTIONS: No  FALLS:  Has patient fallen in last 6 months? No and with sit to stand falls back sometimes  LIVING ENVIRONMENT: Lives with: lives with their family Lives in: House/apartment Stairs: No Has following equipment at home: Single point cane, Environmental consultant - 2 wheeled, Environmental consultant - 4 wheeled, shower chair, Grab bars, and stand up walker also; also has shower chair at sink for shaving, brushing teeth etc, also has transporter.  OCCUPATION: retired  PLOF: Independent with household mobility with device  PATIENT GOALS: to walk without a walker  NEXT MD VISIT: 09/28/22  OBJECTIVE:  Note: Objective measures were completed at Evaluation unless otherwise noted.  DIAGNOSTIC FINDINGS: N/A  COGNITION: Overall cognitive status: Within functional limits for tasks assessed     SENSATION: Neuropathy in B feet   MUSCLE LENGTH: Marked HS R>L, hip flexor tightness   POSTURE: rounded shoulders and forward head  LOWER EXTREMITY ROM: WFL for tasks assessed  LOWER EXTREMITY MMT: *tested in hooklying  MMT Right eval Left eval  Hip flexion 4 4  Hip extension Able to bridge Able to bridge  Hip abduction 4-* 4-*  Hip adduction    Hip internal rotation    Hip external rotation    Knee flexion    Knee extension 4 4+  Ankle dorsiflexion 4+ 4-  Ankle plantarflexion    Ankle inversion    Ankle eversion     (Blank rows = not tested)   FUNCTIONAL TESTS:  30 seconds chair stand test Timed up and go (TUG): 59.36 with 4WW Stance time: 45 sec patient reports unsteadiness and feels  he goes backwards 07/27/2023 92 ft decreased cadence; decreased step length; absent heel strike  12/26: 5x STS: unable to rise without heavy UE use; attempted to use armrests for test but does not come all the way up;  next trial with chair +cushion to touch at least one RW handle 48.2 sec:   TUG: cushion in seat +arm use and RW:  52.49 sec  GAIT: Distance walked: 25 Assistive device utilized: Environmental consultant - 4 wheeled Level of assistance: Modified independence Comments: slow speed, decreased step and stride, decreased heel strike, forward trunk lean.  TRANSFERS: Sit to stand: Uses walker to pull up; stand to sit: does not fully back up to chair and does not reach back; Supine to sit: needs min A for log roll  (has some back pain with sit <>  supine due to sitting upward from supine) also fear of falling from bed, sit to supine needs min A to lift legs on to mat table  TODAY'S TREATMENT:                                                                                                                              DATE:   08/10/23: NuStep Level 1 6 minutes- PT present to discuss status Leg press (sit and pivot)seat 7 (extra recline) 65# bil 2 x 10 ; no single leg today since patient felt very weak  Cable pulls 10# seated 2 x 10 Seated cable overhead lat pulls 2 x 10  10# Standing heel raises at barre x 12  6 inch step taps x 10 bilateral UE support Seated shoulder ER 2 x 10 Sit to stand x 5 with UE support max verbal cues for hip extension    08/06/23: NuStep Level 1 5 minutes- PT present to discuss status Gait belt on during session Leg press (able to stand and lift leg over)seat 7 (extra recline) 65# bil 10x (2nd set AMRAP of 10 reps); 30# single leg 10x right/left (cues to control descent) able to move Les on and off the footplate without assist and rise from the seat using footplate to pull up from  Trip to the bathroom: seems fatigued on return and needs a short rest break 5x STS as  above TUG Cable pulls 15# seated 10x Seated purple heavy power band stomps 7x right/left Seated cable overhead lat pulls level 17 10x 2    15# Pt reports his chairs at home don't have armrests; pt interested in foam cushion to sit on like we have in the clinic and was given info   07/30/23: NuStep Level 1 5 minutes- PT present to discuss status Gait belt on for duration of session Leg press seat 7 60# bil 10x; 30# single leg 10x right/left ( "I like this") able to move Les on and off the footplate without assist and rise from the seat using footplate to pull up from  Pratt pulls 15# seated  2 sets of 10("I like this one too") Seated HS pink power cord and slider 10x right/left Seated cable overhead lat pulls level 15 15x 15# Seated 5# kettlebell reach down between feet 10x right/left; pt requests to try 10# 8x right/left     PATIENT EDUCATION:  Education details: PT eval findings, anticipated POC, initial HEP, postural awareness, and transfer safety  Person educated: Patient and brother Education method: Explanation, Demonstration, and Handouts Education comprehension: verbalized understanding and returned demonstration  HOME EXERCISE PROGRAM: Access Code: [email or web address] URL: [email or web address] Date: 08/10/2023 Prepared by: Claude Manges  Exercises - Supine Bridge  - 2 x daily - 7 x weekly - 1-3 sets - 10 reps - Sit to Stand with Counter Support  - 3 x daily - 3-4 x weekly - 1 sets - 1-5 reps - Hooklying Clamshell with Resistance  -  2 x daily - 3-4 x weekly - 1 sets - 10 reps - Seated Long Arc Quad  - 1 x daily - 7 x weekly - 1-3 sets - 10 reps - 5 sec hold - Standing Hip Abduction with Counter Support  - 1 x daily - 7 x weekly - 1-2 sets - 10 reps - Standing March with Counter Support  - 1 x daily - 7 x weekly - 1-2 sets - 10 reps - Standing Heel Raise with Support  - 1 x daily - 7 x weekly - 1-2 sets - 10 reps - Shoulder External Rotation and Scapular Retraction with  Resistance  - 1 x daily - 7 x weekly - 2 sets - 10 reps - Sit to Stand with Armchair  - 1 x daily - 7 x weekly - 1 sets - 5 reps   Patient Education - Tips to reduce freezing episodes with standing or walking  ASSESSMENT:  CLINICAL IMPRESSION: Imraan presented to physical therapy with increased weakness and arm soreness. Patient needed assistance when walking from the waiting room to the NuStep. Patient tolerated treatment session well and required occasional rest breaks due to fatigue. Patient was able to perform all exercises form last treatment session at a decreased weight. While performing sit to stands patient required max verbal cues for hip extension and upright posture. He had one LOB posteriorly into the chair when completing sit to stands. Noted improved performance with last rep. Updated patient's HEP. Patient will benefit from skilled PT to address the below impairments and improve overall function.    OBJECTIVE IMPAIRMENTS: Abnormal gait, decreased balance, decreased ROM, decreased strength, decreased safety awareness, dizziness, impaired flexibility, impaired sensation, postural dysfunction, and pain.   ACTIVITY LIMITATIONS: carrying, lifting, bending, standing, squatting, stairs, transfers, bed mobility, continence, bathing, and locomotion level  PARTICIPATION LIMITATIONS: shopping and community activity  PERSONAL FACTORS: Age, Fitness, Time since onset of injury/illness/exacerbation, and 3+ comorbidities: Compression fx L1 2022, CAD, CKD (stage 4), neuropathy, retinopathy  are also affecting patient's functional outcome.   REHAB POTENTIAL: Good  CLINICAL DECISION MAKING: Unstable/unpredictable  EVALUATION COMPLEXITY: High   GOALS: Goals reviewed with patient? Yes  SHORT TERM GOALS: Target date: 07/30/23 Improved TUG to < 25 sec to decrease fall risk.  Baseline: Goal status: ongoing  2.  Able to perform 5XSTS without UE assist and controlled descent showing  improved LE strength.  Baseline:  Goal status: ongoing  3.  Patient will be able to stand safely for 2 min without UE assist Baseline:  Goal status: ongoing  LONG TERM GOALS: Target date: 08/27/23  Ind with advanced HEP Baseline:  Goal status: INITIAL  2. Patient will be able to ambulate 600' with LRAD with good safety to access community.  Baseline:  Goal status: INITIAL  3.  Patient able to perform bed mobility with SBA Baseline:  Goal status: INITIAL  4.  Improved 30 sec chair test to 5 reps  Baseline:  Goal status: INITIAL  5.  Patient able to get in and out of his recliner at home with good control and no LOB Baseline:  Goal status: INITIAL    PLAN:  PT FREQUENCY: 3x/week  PT DURATION: 8 weeks  PLANNED INTERVENTIONS: 97164- PT Re-evaluation, 97110-Therapeutic exercises, 97530- Therapeutic activity, 97112- Neuromuscular re-education, 97535- Self Care, [id number]- Manual therapy, [phone]- Gait training, [phone]- Aquatic Therapy, [phone]- Electrical stimulation (unattended), Patient/Family education, Balance training, Stair training, Taping, Joint mobilization, Spinal mobilization, DME instructions, Cryotherapy, and Moist heat  PLAN  FOR NEXT SESSION: 10th visit PN; sit to stands; standing tolerance; gluteal strengthening   Claude Manges, PT 08/10/23 3:46 PM

## 2023-08-13 ENCOUNTER — Ambulatory Visit: Payer: PPO | Attending: Family Medicine | Admitting: Physical Therapy

## 2023-08-13 DIAGNOSIS — R262 Difficulty in walking, not elsewhere classified: Secondary | ICD-10-CM | POA: Insufficient documentation

## 2023-08-13 DIAGNOSIS — M6281 Muscle weakness (generalized): Secondary | ICD-10-CM | POA: Insufficient documentation

## 2023-08-13 DIAGNOSIS — R2689 Other abnormalities of gait and mobility: Secondary | ICD-10-CM | POA: Insufficient documentation

## 2023-08-13 NOTE — Therapy (Signed)
 OUTPATIENT PHYSICAL THERAPY LOWER EXTREMITY TREATMENT   Patient Name: Jeffrey [name] MRN: [id number] DOB:1955-10-10, 68 y.o., male Today's Date: 08/13/2023   Progress Note Reporting Period 07/02/23 to 08/13/2023  See note below for Objective Data and Assessment of Progress/Goals.     END OF SESSION:  PT End of Session - 08/13/23 1447     Visit Number 10    Date for PT Re-Evaluation 08/27/23    Authorization Type HTA    Progress Note Due on Visit 20    PT Start Time 1447    PT Stop Time 1530    PT Time Calculation (min) 43 min    Equipment Utilized During Treatment Gait belt    Activity Tolerance Patient tolerated treatment well                 Past Medical History:  Diagnosis Date   Cardiac arrest ([name]) 05/22/2016   Cataract    CHF (congestive heart failure) ([name])    CKD (chronic kidney disease) stage 3, GFR 30-59 ml/min ([name]) 05/28/2021   CKD (chronic kidney disease) stage 4, GFR 15-29 ml/min ([name]) 05/28/2021   Diabetes mellitus without complication ([name])    type 2   Diabetic retinopathy ([name])    Hyperlipidemia    Hypertension    Memory loss    mild   Myocardial infarct ([name]) 2105   Retinopathy    Both eyes   S/P primary angioplasty with coronary stent 05/22/2016   Vitamin B12 deficiency    Vitreous hemorrhage of left eye ([name])    proliferative diabetic retinopathy   Past Surgical History:  Procedure Laterality Date   ANGIOPLASTY     2015   COLONOSCOPY     multiple   EYE SURGERY     PARS PLANA VITRECTOMY Left 03/15/2020   Procedure: PARS PLANA VITRECTOMY WITH 25 GAUGE;  Surgeon: Jarold Mayo, MD;  Location: [facility] OR;  Service: Ophthalmology;  Laterality: Left;   PHOTOCOAGULATION WITH LASER Left 03/15/2020   Procedure: PHOTOCOAGULATION WITH LASER; INTRAVITREAL INJECTION OF AVASTIN ;  Surgeon: Jarold Mayo, MD;  Location: [facility] OR;  Service: Ophthalmology;  Laterality: Left;   REFRACTIVE SURGERY     UPPER GI ENDOSCOPY     several   VITRECTOMY      Patient Active Problem List   Diagnosis Date Noted   Rhinitis 05/20/2023   Cervical spondylosis 08/26/2022   CKD (chronic kidney disease) stage 4, GFR 15-29 ml/min ([name]) 05/28/2021   Adjustment disorder 01/22/2021   Chronic back pain 10/22/2020   Peripheral vertigo 05/15/2020   Dermatitis 01/04/2020   Unintentional weight loss with loose stools 05/19/2019   Low vitamin B12 level 03/29/2019   Heme positive stool 03/14/2019   Normocytic anemia 03/14/2019   Chronic diastolic heart failure ([name]) 02/08/2019   Diabetic retinopathy ([name]) 09/08/2018   Physical debility 05/24/2018   Varicose veins of both lower extremities 03/01/2018   Fatigue due to exertion 10/14/2017   GERD (gastroesophageal reflux disease) 08/25/2017   CAD in native artery 04/14/2017   Hyperlipidemia associated with type 2 diabetes mellitus ([name]) 04/14/2017   Diabetic peripheral neuropathy associated with type 2 diabetes mellitus ([name]) 08/17/2016   [name] 05/02/2016   Hypertension associated with diabetes ([name]) 05/02/2016    PCP: Kennyth Worth HERO, MD   REFERRING PROVIDER: Kennyth Worth HERO, MD   REFERRING DIAG: R53.81 (ICD-10-CM) - Physical debility   THERAPY DIAG:  Muscle weakness (generalized)  Other abnormalities of gait and mobility  Difficulty in walking,  not elsewhere classified  Rationale for Evaluation and Treatment: Rehabilitation  ONSET DATE: 2022  SUBJECTIVE:   SUBJECTIVE STATEMENT: Arrives via transport chair rather than walker.  States he feels OK today, had a shower about an hour ago.  States he was really tired after PT last time, getting to the car and getting to the elevator at his home.      From eval: Patient has been to two different therapy places and not had good experiences. One at [facility], one private practice. I want to be able to walk without the walker and I'm having trouble getting out of chairs without the walker. Presently sits in a dining  chair. Can't sit in his recliner in his bedroom anymore. Wants to work on balance also. Joined [employer] which has a track and an elevator (on pause right now due to disability). Periodically legs shake when he is walking.   Brother: [name] identical twins  PERTINENT HISTORY: Compression fx L1 2022, CAD, CKD (stage 4), DM, cardiac arrest 2017, neuropathy, retinopathy Long history of gait abnormality beginning back in 2022. He has a history of CAD and believes that his debility began following a covid infection. In December odf 2022 he fell and suffered a compression fracture of L1.  PAIN:  Are you having pain? No  PRECAUTIONS: Fall  RED FLAGS: None   WEIGHT BEARING RESTRICTIONS: No  FALLS:  Has patient fallen in last 6 months? No and with sit to stand falls back sometimes  LIVING ENVIRONMENT: Lives with: lives with their family Lives in: House/apartment Stairs: No Has following equipment at home: Single point cane, Environmental Consultant - 2 wheeled, Environmental Consultant - 4 wheeled, shower chair, Grab bars, and stand up walker also; also has shower chair at sink for shaving, brushing teeth etc, also has transporter.  OCCUPATION: retired  PLOF: Independent with household mobility with device  PATIENT GOALS: to walk without a walker  NEXT MD VISIT: 09/28/22  OBJECTIVE:  Note: Objective measures were completed at Evaluation unless otherwise noted.  DIAGNOSTIC FINDINGS: N/A  COGNITION: Overall cognitive status: Within functional limits for tasks assessed     SENSATION: Neuropathy in B feet   MUSCLE LENGTH: Marked HS R>L, hip flexor tightness   POSTURE: rounded shoulders and forward head  LOWER EXTREMITY ROM: WFL for tasks assessed  LOWER EXTREMITY MMT: *tested in hooklying  MMT Right eval Left eval  Hip flexion 4 4  Hip extension Able to bridge Able to bridge  Hip abduction 4-* 4-*  Hip adduction    Hip internal rotation    Hip external rotation    Knee flexion    Knee extension 4 4+   Ankle dorsiflexion 4+ 4-  Ankle plantarflexion    Ankle inversion    Ankle eversion     (Blank rows = not tested)   FUNCTIONAL TESTS:  30 seconds chair stand test Timed up and go (TUG): 59.36 with 4WW Stance time: 45 sec patient reports unsteadiness and feels he goes backwards 07/27/2023 92 ft decreased cadence; decreased step length; absent heel strike  12/26: 5x STS: unable to rise without heavy UE use; attempted to use armrests for test but does not come all the way up;  next trial with chair +cushion to touch at least one RW handle 48.2 sec:   TUG: cushion in seat +arm use and RW:  52.49 sec  08/13/23 attempted 5x STS but pt unable to complete: 1st rep does not come all the way up and then plops  heavily into the transport wheelchair (he then recounts how this happened at home and how the chair tipped backwards) unable to complete reps Unable to complete TUG today, he does not have his 4 wheeled walker today and feels he is slower with the clinic RW  GAIT: Distance walked: 25 Assistive device utilized: Walker - 4 wheeled Level of assistance: Modified independence Comments: slow speed, decreased step and stride, decreased heel strike, forward trunk lean.  TRANSFERS: Sit to stand: Uses walker to pull up; stand to sit: does not fully back up to chair and does not reach back; Supine to sit: needs min A for log roll  (has some back pain with sit <>supine due to sitting upward from supine) also fear of falling from bed, sit to supine needs min A to lift legs on to mat table  TODAY'S TREATMENT:                                                                                                                              DATE:   08/13/2023: NuStep Level 1 6 minutes- PT present to discuss status Pt has only transport chair: Used clinic RW to ambulate between equipment Leg press seat 7 (extra recline) 65# bil 2 x 10; 30# single leg 15x right/left (cues to extend all the way out and  control descent) able to move Les on and off the footplate without assist and rise from the seat using footplate to pull up from single leg Cable pulls 10# seated 2 x 10 (in transport chair) Seated 5# kettlebell reach down toward floor and then pull up to chest 2x 10 LAQ 5# 8x  right/ 5x on left (very fatiguing) Attempted 5x STS test however pt has great difficulty  Seated cable overhead lat pulls  x 10  10# (feel it in my wrists today) Cable pulleys assisted sit to stand 100# counter balance 5x (worked well) 08/10/23: NuStep Level 1 6 minutes- PT present to discuss status Leg press (sit and pivot)seat 7 (extra recline) 65# bil 2 x 10 ; no single leg today since patient felt very weak  Cable pulls 10# seated 2 x 10 Seated cable overhead lat pulls 2 x 10  10# Standing heel raises at barre x 12  6 inch step taps x 10 bilateral UE support Seated shoulder ER 2 x 10 Sit to stand x 5 with UE support max verbal cues for hip extension    08/06/23: NuStep Level 1 5 minutes- PT present to discuss status Gait belt on during session Leg press (able to stand and lift leg over)seat 7 (extra recline) 65# bil 10x (2nd set AMRAP of 10 reps); 30# single leg 10x right/left (cues to control descent) able to move Les on and off the footplate without assist and rise from the seat using footplate to pull up from  Trip to the bathroom: seems fatigued on return and needs a short rest break 5x STS as above  TUG Cable pulls 15# seated 10x Seated purple heavy power band stomps 7x right/left Seated cable overhead lat pulls level 17 10x 2    15# Pt reports his chairs at home don't have armrests; pt interested in foam cushion to sit on like we have in the clinic and was given info   07/30/23: NuStep Level 1 5 minutes- PT present to discuss status Gait belt on for duration of session Leg press seat 7 60# bil 10x; 30# single leg 10x right/left ( I like this) able to move Les on and off the footplate without assist  and rise from the seat using footplate to pull up from  Watkins pulls 15# seated  2 sets of 10(I like this one too) Seated HS pink power cord and slider 10x right/left Seated cable overhead lat pulls level 15 15x 15# Seated 5# kettlebell reach down between feet 10x right/left; pt requests to try 10# 8x right/left     PATIENT EDUCATION:  Education details: PT eval findings, anticipated POC, initial HEP, postural awareness, and transfer safety  Person educated: Patient and brother Education method: Explanation, Demonstration, and Handouts Education comprehension: verbalized understanding and returned demonstration  HOME EXERCISE PROGRAM: Access Code: [email or web address] URL: [email or web address] Date: 08/10/2023 Prepared by: Kristeen Sar  Exercises - Supine Bridge  - 2 x daily - 7 x weekly - 1-3 sets - 10 reps - Sit to Stand with Counter Support  - 3 x daily - 3-4 x weekly - 1 sets - 1-5 reps - Hooklying Clamshell with Resistance  - 2 x daily - 3-4 x weekly - 1 sets - 10 reps - Seated Long Arc Quad  - 1 x daily - 7 x weekly - 1-3 sets - 10 reps - 5 sec hold - Standing Hip Abduction with Counter Support  - 1 x daily - 7 x weekly - 1-2 sets - 10 reps - Standing March with Counter Support  - 1 x daily - 7 x weekly - 1-2 sets - 10 reps - Standing Heel Raise with Support  - 1 x daily - 7 x weekly - 1-2 sets - 10 reps - Shoulder External Rotation and Scapular Retraction with Resistance  - 1 x daily - 7 x weekly - 2 sets - 10 reps - Sit to Stand with Armchair  - 1 x daily - 7 x weekly - 1 sets - 5 reps   Patient Education - Tips to reduce freezing episodes with standing or walking  ASSESSMENT:  CLINICAL IMPRESSION: The patient would benefit from a continuation of skilled PT for a further progression of strengthening and functional mobility.  Progress is slower secondary to numerous co-morbidities.  Expect he will make progress toward goals but a longer course of rehab is expected.   His LE strength is quite impaired with sit to stand therefore he relies heavily on Ues with rising as well as with heavy arm use on his RW during gait.  Another limiting factor is quick fatigue.  Ex's need close monitoring and modification so that he doesn't become so overfatigued that he can't get into the car or back into his apartment afterwards.  He came via wheelchair/transport chair today in order to conserve energy for PT.   OBJECTIVE IMPAIRMENTS: Abnormal gait, decreased balance, decreased ROM, decreased strength, decreased safety awareness, dizziness, impaired flexibility, impaired sensation, postural dysfunction, and pain.   ACTIVITY LIMITATIONS: carrying, lifting, bending, standing, squatting, stairs, transfers, bed mobility, continence, bathing, and locomotion level  PARTICIPATION LIMITATIONS: shopping and  community activity  PERSONAL FACTORS: Age, Fitness, Time since onset of injury/illness/exacerbation, and 3+ comorbidities: Compression fx L1 2022, CAD, CKD (stage 4), neuropathy, retinopathy  are also affecting patient's functional outcome.   REHAB POTENTIAL: Good  CLINICAL DECISION MAKING: Unstable/unpredictable  EVALUATION COMPLEXITY: High   GOALS: Goals reviewed with patient? Yes  SHORT TERM GOALS: Target date: 07/30/23 Improved TUG to < 25 sec to decrease fall risk.  Baseline: Goal status: ongoing  2.  Able to perform 5XSTS without UE assist and controlled descent showing improved LE strength.  Baseline:  Goal status: ongoing  3.  Patient will be able to stand safely for 2 min without UE assist Baseline:  Goal status: ongoing  LONG TERM GOALS: Target date: 08/27/23  Ind with advanced HEP Baseline:  Goal status: INITIAL  2. Patient will be able to ambulate 600' with LRAD with good safety to access community.  Baseline:  Goal status: INITIAL  3.  Patient able to perform bed mobility with SBA Baseline:  Goal status: INITIAL  4.  Improved 30 sec chair test  to 5 reps  Baseline:  Goal status: INITIAL  5.  Patient able to get in and out of his recliner at home with good control and no LOB Baseline:  Goal status: INITIAL    PLAN:  PT FREQUENCY: 3x/week  PT DURATION: 8 weeks  PLANNED INTERVENTIONS: 97164- PT Re-evaluation, 97110-Therapeutic exercises, 97530- Therapeutic activity, 97112- Neuromuscular re-education, 97535- Self Care, [id number]- Manual therapy, [phone]- Gait training, [phone]- Aquatic Therapy, 97014- Electrical stimulation (unattended), Patient/Family education, Balance training, Stair training, Taping, Joint mobilization, Spinal mobilization, DME instructions, Cryotherapy, and Moist heat  PLAN FOR NEXT SESSION:  watch for quick fatigue/overfatigue so that patient has energy to get into car and home after PT;  leg press; sit to stands with pulley counter weight to assist; standing tolerance; gluteal strengthening; when stronger: do stairs (relies on elevator where he lives but feels he should be able to do the stairs in case of emergency)  Glade Pesa, PT 08/13/23 7:42 PM Phone: [phone] Fax: [phone]

## 2023-08-14 ENCOUNTER — Ambulatory Visit: Payer: PPO | Admitting: Physical Therapy

## 2023-08-14 DIAGNOSIS — R2689 Other abnormalities of gait and mobility: Secondary | ICD-10-CM

## 2023-08-14 DIAGNOSIS — R262 Difficulty in walking, not elsewhere classified: Secondary | ICD-10-CM

## 2023-08-14 DIAGNOSIS — M6281 Muscle weakness (generalized): Secondary | ICD-10-CM | POA: Diagnosis not present

## 2023-08-14 NOTE — Therapy (Signed)
 OUTPATIENT PHYSICAL THERAPY LOWER EXTREMITY TREATMENT   Patient Name: Jeffrey [name] MRN: [id number] DOB:May 19, 1956, 68 y.o., male Today's Date: 08/14/2023    END OF SESSION:  PT End of Session - 08/14/23 1014     Visit Number 11    Date for PT Re-Evaluation 08/27/23    Authorization Type HTA    Progress Note Due on Visit 20    PT Start Time 1015    PT Stop Time 1055    PT Time Calculation (min) 40 min    Activity Tolerance Patient tolerated treatment well    Behavior During Therapy [facility] for tasks assessed/performed                 Past Medical History:  Diagnosis Date   Cardiac arrest ([name]) 05/22/2016   Cataract    CHF (congestive heart failure) ([name])    CKD (chronic kidney disease) stage 3, GFR 30-59 ml/min ([name]) 05/28/2021   CKD (chronic kidney disease) stage 4, GFR 15-29 ml/min ([name]) 05/28/2021   Diabetes mellitus without complication ([name])    type 2   Diabetic retinopathy ([name])    Hyperlipidemia    Hypertension    Memory loss    mild   Myocardial infarct ([name]) 2105   Retinopathy    Both eyes   S/P primary angioplasty with coronary stent 05/22/2016   Vitamin B12 deficiency    Vitreous hemorrhage of left eye ([name])    proliferative diabetic retinopathy   Past Surgical History:  Procedure Laterality Date   ANGIOPLASTY     2015   COLONOSCOPY     multiple   EYE SURGERY     PARS PLANA VITRECTOMY Left 03/15/2020   Procedure: PARS PLANA VITRECTOMY WITH 25 GAUGE;  Surgeon: Jarold Mayo, MD;  Location: [facility] OR;  Service: Ophthalmology;  Laterality: Left;   PHOTOCOAGULATION WITH LASER Left 03/15/2020   Procedure: PHOTOCOAGULATION WITH LASER; INTRAVITREAL INJECTION OF AVASTIN ;  Surgeon: Jarold Mayo, MD;  Location: [facility] OR;  Service: Ophthalmology;  Laterality: Left;   REFRACTIVE SURGERY     UPPER GI ENDOSCOPY     several   VITRECTOMY     Patient Active Problem List   Diagnosis Date Noted   Rhinitis 05/20/2023   Cervical spondylosis 08/26/2022   CKD (chronic  kidney disease) stage 4, GFR 15-29 ml/min ([name]) 05/28/2021   Adjustment disorder 01/22/2021   Chronic back pain 10/22/2020   Peripheral vertigo 05/15/2020   Dermatitis 01/04/2020   Unintentional weight loss with loose stools 05/19/2019   Low vitamin B12 level 03/29/2019   Heme positive stool 03/14/2019   Normocytic anemia 03/14/2019   Chronic diastolic heart failure ([name]) 02/08/2019   Diabetic retinopathy ([name]) 09/08/2018   Physical debility 05/24/2018   Varicose veins of both lower extremities 03/01/2018   Fatigue due to exertion 10/14/2017   GERD (gastroesophageal reflux disease) 08/25/2017   CAD in native artery 04/14/2017   Hyperlipidemia associated with type 2 diabetes mellitus ([name]) 04/14/2017   Diabetic peripheral neuropathy associated with type 2 diabetes mellitus ([name]) 08/17/2016   [name] 05/02/2016   Hypertension associated with diabetes ([name]) 05/02/2016    PCP: Kennyth Worth HERO, MD   REFERRING PROVIDER: Kennyth Worth HERO, MD   REFERRING DIAG: R53.81 (ICD-10-CM) - Physical debility   THERAPY DIAG:  Muscle weakness (generalized)  Other abnormalities of gait and mobility  Difficulty in walking, not elsewhere classified  Rationale for Evaluation and Treatment: Rehabilitation  ONSET DATE: 2022  SUBJECTIVE:   SUBJECTIVE STATEMENT:  Patient arrives via transport chair. States he feels about as tired as usual, I'm tired all the time.  I don't sleep well.    From eval: Patient has been to two different therapy places and not had good experiences. One at [facility], one private practice. I want to be able to walk without the walker and I'm having trouble getting out of chairs without the walker. Presently sits in a dining chair. Can't sit in his recliner in his bedroom anymore. Wants to work on balance also. Joined [employer] which has a track and an elevator (on pause right now due to disability). Periodically legs shake when he is walking.    Brother: [name] identical twins  PERTINENT HISTORY: Compression fx L1 2022, CAD, CKD (stage 4), DM, cardiac arrest 2017, neuropathy, retinopathy Long history of gait abnormality beginning back in 2022. He has a history of CAD and believes that his debility began following a covid infection. In December odf 2022 he fell and suffered a compression fracture of L1.  PAIN:  Are you having pain? No  PRECAUTIONS: Fall  RED FLAGS: None   WEIGHT BEARING RESTRICTIONS: No  FALLS:  Has patient fallen in last 6 months? No and with sit to stand falls back sometimes  LIVING ENVIRONMENT: Lives with: lives with their family Lives in: House/apartment Stairs: No Has following equipment at home: Single point cane, Environmental Consultant - 2 wheeled, Environmental Consultant - 4 wheeled, shower chair, Grab bars, and stand up walker also; also has shower chair at sink for shaving, brushing teeth etc, also has transporter.  OCCUPATION: retired  PLOF: Independent with household mobility with device  PATIENT GOALS: to walk without a walker  NEXT MD VISIT: 09/28/22  OBJECTIVE:  Note: Objective measures were completed at Evaluation unless otherwise noted.  DIAGNOSTIC FINDINGS: N/A  COGNITION: Overall cognitive status: Within functional limits for tasks assessed     SENSATION: Neuropathy in B feet   MUSCLE LENGTH: Marked HS R>L, hip flexor tightness   POSTURE: rounded shoulders and forward head  LOWER EXTREMITY ROM: WFL for tasks assessed  LOWER EXTREMITY MMT: *tested in hooklying  MMT Right eval Left eval  Hip flexion 4 4  Hip extension Able to bridge Able to bridge  Hip abduction 4-* 4-*  Hip adduction    Hip internal rotation    Hip external rotation    Knee flexion    Knee extension 4 4+  Ankle dorsiflexion 4+ 4-  Ankle plantarflexion    Ankle inversion    Ankle eversion     (Blank rows = not tested)   FUNCTIONAL TESTS:  30 seconds chair stand test Timed up and go (TUG): 59.36 with 4WW Stance time:  45 sec patient reports unsteadiness and feels he goes backwards 07/27/2023 92 ft decreased cadence; decreased step length; absent heel strike  12/26: 5x STS: unable to rise without heavy UE use; attempted to use armrests for test but does not come all the way up;  next trial with chair +cushion to touch at least one RW handle 48.2 sec:   TUG: cushion in seat +arm use and RW:  52.49 sec  08/13/23 attempted 5x STS but pt unable to complete: 1st rep does not come all the way up and then plops heavily into the transport wheelchair (he then recounts how this happened at home and how the chair tipped backwards) unable to complete reps Unable to complete TUG today, he does not have his 4 wheeled walker today and feels he is  slower with the clinic RW  GAIT: Distance walked: 25 Assistive device utilized: Walker - 4 wheeled Level of assistance: Modified independence Comments: slow speed, decreased step and stride, decreased heel strike, forward trunk lean.  TRANSFERS: Sit to stand: Uses walker to pull up; stand to sit: does not fully back up to chair and does not reach back; Supine to sit: needs min A for log roll  (has some back pain with sit <>supine due to sitting upward from supine) also fear of falling from bed, sit to supine needs min A to lift legs on to mat table  TODAY'S TREATMENT:                                                                                                                              DATE:   08/14/2023: Needs mod assist to transfer from transport chair to Nu-Step (gait belt on) NuStep Level 1 7 minutes- PT present to discuss status-- needs assist to put Les on pedals to start Pt has only transport chair: Used clinic RW to ambulate between equipment Leg press seat 7 (extra recline) 65# bil  2 sets x 10 (1 minute rest between sets) feels harder today I'm weaker quite challenging; 30# single leg 10x right/left (needs max assist to place feet on platform today) some low  back pain reported  Cable pulls/trunk extension (sitting in front 1/2 of seat) 10# seated 2 x 10 (in transport chair) pt put on gloves for 2nd set due to hand discomfort Chair sit ups holding 5# at chest 10x  Cable pulleys assisted sit to stand 100# counter balance 5x  08/13/2023: NuStep Level 1 6 minutes- PT present to discuss status Pt has only transport chair: Used clinic RW to ambulate between equipment Leg press seat 7 (extra recline) 65# bil 2 x 10; 30# single leg 15x right/left (cues to extend all the way out and control descent) able to move Les on and off the footplate without assist and rise from the seat using footplate to pull up from single leg Cable pulls 10# seated 2 x 10 (in transport chair) Seated 5# kettlebell reach down toward floor and then pull up to chest 2x 10 LAQ 5# 8x  right/ 5x on left (very fatiguing) Attempted 5x STS test however pt has great difficulty  Seated cable overhead lat pulls  x 10  10# (feel it in my wrists today) Cable pulleys assisted sit to stand 100# counter balance 5x (worked well) 08/10/23: NuStep Level 1 6 minutes- PT present to discuss status Leg press (sit and pivot)seat 7 (extra recline) 65# bil 2 x 10 ; no single leg today since patient felt very weak  Cable pulls 10# seated 2 x 10 Seated cable overhead lat pulls 2 x 10  10# Standing heel raises at barre x 12  6 inch step taps x 10 bilateral UE support Seated shoulder ER 2 x 10 Sit to stand x 5 with UE  support max verbal cues for hip extension    08/06/23: NuStep Level 1 5 minutes- PT present to discuss status Gait belt on during session Leg press (able to stand and lift leg over)seat 7 (extra recline) 65# bil 10x (2nd set AMRAP of 10 reps); 30# single leg 10x right/left (cues to control descent) able to move Les on and off the footplate without assist and rise from the seat using footplate to pull up from  Trip to the bathroom: seems fatigued on return and needs a short rest break 5x STS  as above TUG Cable pulls 15# seated 10x Seated purple heavy power band stomps 7x right/left Seated cable overhead lat pulls level 17 10x 2    15# Pt reports his chairs at home don't have armrests; pt interested in foam cushion to sit on like we have in the clinic and was given info   07/30/23: NuStep Level 1 5 minutes- PT present to discuss status Gait belt on for duration of session Leg press seat 7 60# bil 10x; 30# single leg 10x right/left ( I like this) able to move Les on and off the footplate without assist and rise from the seat using footplate to pull up from  Bonita pulls 15# seated  2 sets of 10(I like this one too) Seated HS pink power cord and slider 10x right/left Seated cable overhead lat pulls level 15 15x 15# Seated 5# kettlebell reach down between feet 10x right/left; pt requests to try 10# 8x right/left     PATIENT EDUCATION:  Education details: PT eval findings, anticipated POC, initial HEP, postural awareness, and transfer safety  Person educated: Patient and brother Education method: Explanation, Demonstration, and Handouts Education comprehension: verbalized understanding and returned demonstration  HOME EXERCISE PROGRAM: Access Code: [email or web address] URL: [email or web address] Date: 08/10/2023 Prepared by: Kristeen Sar  Exercises - Supine Bridge  - 2 x daily - 7 x weekly - 1-3 sets - 10 reps - Sit to Stand with Counter Support  - 3 x daily - 3-4 x weekly - 1 sets - 1-5 reps - Hooklying Clamshell with Resistance  - 2 x daily - 3-4 x weekly - 1 sets - 10 reps - Seated Long Arc Quad  - 1 x daily - 7 x weekly - 1-3 sets - 10 reps - 5 sec hold - Standing Hip Abduction with Counter Support  - 1 x daily - 7 x weekly - 1-2 sets - 10 reps - Standing March with Counter Support  - 1 x daily - 7 x weekly - 1-2 sets - 10 reps - Standing Heel Raise with Support  - 1 x daily - 7 x weekly - 1-2 sets - 10 reps - Shoulder External Rotation and Scapular Retraction  with Resistance  - 1 x daily - 7 x weekly - 2 sets - 10 reps - Sit to Stand with Armchair  - 1 x daily - 7 x weekly - 1 sets - 5 reps   Patient Education - Tips to reduce freezing episodes with standing or walking  ASSESSMENT:  CLINICAL IMPRESSION: The patient arrives reporting no change in status however he requires more physical assistance today than usual including transfers from transport chair to Nu-Step and moving Les on/off the pedals and leg press footplate.  All movements seem to require much more effort.  This could be related to appointment time (earlier in the day) or consecutive treatment days of treatment. Therapist continually modifying treatment based on response.   STGs not  met secondary to variable status and multiple co-morbidities which may slow progress.     OBJECTIVE IMPAIRMENTS: Abnormal gait, decreased balance, decreased ROM, decreased strength, decreased safety awareness, dizziness, impaired flexibility, impaired sensation, postural dysfunction, and pain.   ACTIVITY LIMITATIONS: carrying, lifting, bending, standing, squatting, stairs, transfers, bed mobility, continence, bathing, and locomotion level  PARTICIPATION LIMITATIONS: shopping and community activity  PERSONAL FACTORS: Age, Fitness, Time since onset of injury/illness/exacerbation, and 3+ comorbidities: Compression fx L1 2022, CAD, CKD (stage 4), neuropathy, retinopathy  are also affecting patient's functional outcome.   REHAB POTENTIAL: Good  CLINICAL DECISION MAKING: Unstable/unpredictable  EVALUATION COMPLEXITY: High   GOALS: Goals reviewed with patient? Yes  SHORT TERM GOALS: Target date: 07/30/23 Improved TUG to < 25 sec to decrease fall risk.  Baseline: Goal status: ongoing  2.  Able to perform 5XSTS without UE assist and controlled descent showing improved LE strength.  Baseline:  Goal status: ongoing  3.  Patient will be able to stand safely for 2 min without UE assist Baseline:  Goal  status: ongoing  LONG TERM GOALS: Target date: 08/27/23  Ind with advanced HEP Baseline:  Goal status: INITIAL  2. Patient will be able to ambulate 600' with LRAD with good safety to access community.  Baseline:  Goal status: INITIAL  3.  Patient able to perform bed mobility with SBA Baseline:  Goal status: INITIAL  4.  Improved 30 sec chair test to 5 reps  Baseline:  Goal status: INITIAL  5.  Patient able to get in and out of his recliner at home with good control and no LOB Baseline:  Goal status: INITIAL    PLAN:  PT FREQUENCY: 3x/week  PT DURATION: 8 weeks  PLANNED INTERVENTIONS: 97164- PT Re-evaluation, 97110-Therapeutic exercises, 97530- Therapeutic activity, 97112- Neuromuscular re-education, 97535- Self Care, [id number]- Manual therapy, [phone]- Gait training, [phone]- Aquatic Therapy, 97014- Electrical stimulation (unattended), Patient/Family education, Balance training, Stair training, Taping, Joint mobilization, Spinal mobilization, DME instructions, Cryotherapy, and Moist heat  PLAN FOR NEXT SESSION:  watch for quick fatigue/overfatigue so that patient has energy to get into car and home after PT;  leg press; sit to stands with pulley counter weight to assist; standing tolerance; gluteal strengthening; when stronger: do stairs (relies on elevator where he lives but feels he should be able to do the stairs in case of emergency)  Glade Pesa, PT 08/14/23 12:16 PM Phone: [phone] Fax: [phone]

## 2023-08-17 ENCOUNTER — Encounter: Payer: PPO | Admitting: Physical Therapy

## 2023-08-19 ENCOUNTER — Encounter: Payer: PPO | Admitting: Physical Therapy

## 2023-08-21 ENCOUNTER — Encounter: Payer: PPO | Admitting: Physical Therapy

## 2023-08-24 ENCOUNTER — Encounter: Payer: PPO | Admitting: Physical Therapy

## 2023-08-24 DIAGNOSIS — N184 Chronic kidney disease, stage 4 (severe): Secondary | ICD-10-CM | POA: Diagnosis not present

## 2023-08-26 ENCOUNTER — Encounter: Payer: PPO | Admitting: Physical Therapy

## 2023-08-28 ENCOUNTER — Other Ambulatory Visit: Payer: Self-pay | Admitting: Family Medicine

## 2023-08-28 ENCOUNTER — Encounter: Payer: PPO | Admitting: Physical Therapy

## 2023-08-28 ENCOUNTER — Encounter (HOSPITAL_BASED_OUTPATIENT_CLINIC_OR_DEPARTMENT_OTHER): Payer: Self-pay | Admitting: Cardiovascular Disease

## 2023-08-31 ENCOUNTER — Encounter: Payer: PPO | Admitting: Physical Therapy

## 2023-09-01 ENCOUNTER — Encounter (HOSPITAL_BASED_OUTPATIENT_CLINIC_OR_DEPARTMENT_OTHER): Payer: Self-pay | Admitting: Cardiovascular Disease

## 2023-09-02 ENCOUNTER — Encounter: Payer: PPO | Admitting: Physical Therapy

## 2023-09-03 ENCOUNTER — Ambulatory Visit (INDEPENDENT_AMBULATORY_CARE_PROVIDER_SITE_OTHER): Payer: PPO | Admitting: Family Medicine

## 2023-09-03 ENCOUNTER — Encounter (HOSPITAL_BASED_OUTPATIENT_CLINIC_OR_DEPARTMENT_OTHER): Payer: Self-pay | Admitting: Cardiovascular Disease

## 2023-09-03 VITALS — BP 105/53 | HR 63 | Temp 97.4°F | Ht 67.0 in | Wt 172.0 lb

## 2023-09-03 DIAGNOSIS — Z794 Long term (current) use of insulin: Secondary | ICD-10-CM | POA: Diagnosis not present

## 2023-09-03 DIAGNOSIS — R5381 Other malaise: Secondary | ICD-10-CM

## 2023-09-03 DIAGNOSIS — E1159 Type 2 diabetes mellitus with other circulatory complications: Secondary | ICD-10-CM

## 2023-09-03 DIAGNOSIS — N184 Chronic kidney disease, stage 4 (severe): Secondary | ICD-10-CM

## 2023-09-03 DIAGNOSIS — I152 Hypertension secondary to endocrine disorders: Secondary | ICD-10-CM | POA: Diagnosis not present

## 2023-09-03 DIAGNOSIS — E1142 Type 2 diabetes mellitus with diabetic polyneuropathy: Secondary | ICD-10-CM | POA: Diagnosis not present

## 2023-09-03 DIAGNOSIS — R7989 Other specified abnormal findings of blood chemistry: Secondary | ICD-10-CM | POA: Diagnosis not present

## 2023-09-03 MED ORDER — ONDANSETRON HCL 4 MG PO TABS: 4.0000 mg | ORAL_TABLET | Freq: Three times a day (TID) | ORAL | 0 refills | Status: DC | PRN

## 2023-09-03 NOTE — Assessment & Plan Note (Signed)
Blood pressure on the lower side today with Coreg 3.125 mg twice daily and spironolactone 12.5 mg daily.  This potentially could be contributing some to his malaise.  We did discuss importance of good hydration as well.  Will continue current regimen for now however would consider decreasing antihypertensives if his symptoms did not improve.

## 2023-09-03 NOTE — Patient Instructions (Addendum)
It was very nice to see you today!  We will check blood work and a urine sample.   Return if symptoms worsen or fail to improve.   Take care, Dr [name]  PLEASE NOTE:  If you had any lab tests, please let us know if you have not heard back within a few days. You may see your results on mychart before we have a chance to review them but we will give you a call once they are reviewed by [place].   If we ordered any referrals today, please let us know if you have not heard from their office within the next week.   If you had any urgent prescriptions sent in today, please check with the pharmacy within an hour of our visit to make sure the prescription was transmitted appropriately.   Please try these tips to maintain a healthy lifestyle:  Eat at least 3 REAL meals and 1-2 snacks per day.  Aim for no more than 5 hours between eating.  If you eat breakfast, please do so within one hour of getting up.   Each meal should contain half fruits/vegetables, one quarter protein, and one quarter carbs (no bigger than a computer mouse)  Cut down on sweet beverages. This includes juice, soda, and sweet tea.   Drink at least 1 glass of water with each meal and aim for at least 8 glasses per day  Exercise at least 150 minutes every week.

## 2023-09-03 NOTE — Assessment & Plan Note (Signed)
Last A1c was well-controlled.  He is having more labile readings the last week or so.  Too early to recheck A1c today that we will check glucose on has been met.  He will continue his Lantus 44 units daily and NovoLog as needed.

## 2023-09-03 NOTE — Assessment & Plan Note (Signed)
Recheck B12 

## 2023-09-03 NOTE — Progress Notes (Signed)
Jeffrey [name] is a 68 y.o. male who presents today for an office visit.  Assessment/Plan:  New/Acute Problems: Malaise  Likely multifactorial.  Overall reassuring exam.  His blood pressure is on the lower side however doubt that this is contributing significantly.  We will check labs today including CBC, c-Met, TSH, B12, UA, and urine culture.  We did encourage hydration as it appears he may be mildly dehydrated.  We did send in prescription for Zofran to help him with his nausea as well which will hopefully help him with keeping fluids down.  We discussed reasons to return to care.  Hyperkalemia  Potassium 5.6 a few weeks ago with nephrology.  He is now taking spironolactone 12.5 mg daily per nephrology recommendations for the last week.  Recheck c-Met today.   Chronic Problems Addressed Today: Hypertension associated with diabetes ([name]) Blood pressure on the lower side today with Coreg 3.125 mg twice daily and spironolactone 12.5 mg daily.  This potentially could be contributing some to his malaise.  We did discuss importance of good hydration as well.  Will continue current regimen for now however would consider decreasing antihypertensives if his symptoms did not improve.  [name] Last A1c was well-controlled.  He is having more labile readings the last week or so.  Too early to recheck A1c today that we will check glucose on has been met.  He will continue his Lantus 44 units daily and NovoLog as needed.  Low vitamin B12 level Recheck B12.  CKD (chronic kidney disease) stage 4, GFR 15-29 ml/min ([name]) Continue management per nephrology his BUN and creatinine were improving on labs from a few weeks ago.     Subjective:  HPI:  See assessment / plan for status of chronic conditions. He is here today with malaise.  He is here today with his brother.  He was last seen here a couple months ago.  Since our last visit he has seen his nephrologist.  He  recently had labs which showed potassium of 5.6.  They recommended that he decrease his spironolactone.  Since then he has had significant issues with fatigue, malaise, and generalized weakness.  His sugars have been more labile.  Had a few episodes of nausea and vomiting as well.  No diarrhea.  No constipation.  No fevers or chills.  Has had more brain fog.  His sugars have been more labile the last week as well.  No other obvious precipitating events.  No specific treatments tried.  He is concerned that the change in medication has caused his symptoms.        Objective:  Physical Exam: BP (!) 105/53   Pulse 63   Temp (!) 97.4 F (36.3 C) (Temporal)   Ht 5\' 7"  (1.702 m)   Wt 172 lb (78 kg)   SpO2 99%   BMI 26.94 kg/m   Gen: No acute distress, resting comfortably CV: Regular rate and rhythm with no murmurs appreciated Pulm: Normal work of breathing, clear to auscultation bilaterally with no crackles, wheezes, or rhonchi Neuro: Grossly normal, moves all extremities Psych: Normal affect and thought content  Time Spent: 45 minutes of total time was spent on the date of the encounter performing the following actions: chart review prior to seeing the patient, obtaining history, performing a medically necessary exam, counseling on the treatment plan, placing orders, and documenting in our EHR.        Jeffrey [name]. [name], MD 09/03/2023 3:24 PM

## 2023-09-03 NOTE — Addendum Note (Signed)
Addended by: Lorn Junes on: 09/03/2023 03:27 PM   Modules accepted: Orders

## 2023-09-03 NOTE — Assessment & Plan Note (Signed)
Continue management per nephrology his BUN and creatinine were improving on labs from a few weeks ago.

## 2023-09-04 ENCOUNTER — Encounter: Payer: Self-pay | Admitting: Family Medicine

## 2023-09-04 ENCOUNTER — Other Ambulatory Visit: Payer: Self-pay | Admitting: Family Medicine

## 2023-09-04 ENCOUNTER — Encounter: Payer: PPO | Admitting: Physical Therapy

## 2023-09-04 DIAGNOSIS — E1122 Type 2 diabetes mellitus with diabetic chronic kidney disease: Secondary | ICD-10-CM | POA: Diagnosis not present

## 2023-09-04 DIAGNOSIS — E875 Hyperkalemia: Secondary | ICD-10-CM | POA: Diagnosis not present

## 2023-09-04 DIAGNOSIS — N184 Chronic kidney disease, stage 4 (severe): Secondary | ICD-10-CM | POA: Diagnosis not present

## 2023-09-04 DIAGNOSIS — E559 Vitamin D deficiency, unspecified: Secondary | ICD-10-CM | POA: Diagnosis not present

## 2023-09-04 DIAGNOSIS — I5030 Unspecified diastolic (congestive) heart failure: Secondary | ICD-10-CM | POA: Diagnosis not present

## 2023-09-04 DIAGNOSIS — N189 Chronic kidney disease, unspecified: Secondary | ICD-10-CM | POA: Diagnosis not present

## 2023-09-04 DIAGNOSIS — I129 Hypertensive chronic kidney disease with stage 1 through stage 4 chronic kidney disease, or unspecified chronic kidney disease: Secondary | ICD-10-CM | POA: Diagnosis not present

## 2023-09-04 DIAGNOSIS — D631 Anemia in chronic kidney disease: Secondary | ICD-10-CM | POA: Diagnosis not present

## 2023-09-04 LAB — COMPREHENSIVE METABOLIC PANEL
ALT: 44 U/L (ref 0–53)
AST: 26 U/L (ref 0–37)
Albumin: 4.2 g/dL (ref 3.5–5.2)
Alkaline Phosphatase: 69 U/L (ref 39–117)
BUN: 49 mg/dL — ABNORMAL HIGH (ref 6–23)
CO2: 28 meq/L (ref 19–32)
Calcium: 9.2 mg/dL (ref 8.4–10.5)
Chloride: 102 meq/L (ref 96–112)
Creatinine, Ser: 2.29 mg/dL — ABNORMAL HIGH (ref 0.40–1.50)
GFR: 28.55 mL/min — ABNORMAL LOW (ref >60.00)
Glucose, Bld: 217 mg/dL — ABNORMAL HIGH (ref 70–99)
Potassium: 4.5 meq/L (ref 3.5–5.1)
Sodium: 135 meq/L (ref 135–145)
Total Bilirubin: 0.6 mg/dL (ref 0.2–1.2)
Total Protein: 7 g/dL (ref 6.0–8.3)

## 2023-09-04 LAB — URINALYSIS, ROUTINE W REFLEX MICROSCOPIC
Bilirubin Urine: NEGATIVE
Hgb urine dipstick: NEGATIVE
Ketones, ur: NEGATIVE
Leukocytes,Ua: NEGATIVE
Nitrite: NEGATIVE
RBC / HPF: NONE SEEN (ref 0–?)
Specific Gravity, Urine: 1.015 (ref 1.000–1.030)
Total Protein, Urine: NEGATIVE
Urine Glucose: NEGATIVE
Urobilinogen, UA: 0.2 (ref 0.0–1.0)
pH: 5.5 (ref 5.0–8.0)

## 2023-09-04 LAB — CBC
HCT: 44.8 % (ref 39.0–52.0)
Hemoglobin: 14.9 g/dL (ref 13.0–17.0)
MCHC: 33.4 g/dL (ref 30.0–36.0)
MCV: 95.8 fL (ref 78.0–100.0)
Platelets: 421 10*3/uL — ABNORMAL HIGH (ref 150.0–400.0)
RBC: 4.67 Mil/uL (ref 4.22–5.81)
RDW: 14 % (ref 11.5–15.5)
WBC: 11.9 10*3/uL — ABNORMAL HIGH (ref 4.0–10.5)

## 2023-09-04 LAB — URINE CULTURE
MICRO NUMBER:: 15989271
Result:: NO GROWTH
SPECIMEN QUALITY:: ADEQUATE

## 2023-09-04 LAB — VITAMIN B12: Vitamin B-12: 417 pg/mL (ref 211–911)

## 2023-09-04 LAB — MAGNESIUM: Magnesium: 2.2 mg/dL (ref 1.5–2.5)

## 2023-09-04 LAB — TSH: TSH: 3.39 u[IU]/mL (ref 0.35–5.50)

## 2023-09-04 NOTE — Progress Notes (Signed)
His white blood cell count is a little elevated.  This may indicate an infection.  I would like for him to come back in a week or so to recheck CBC with differential.  His kidney function is stable.  His potassium is back to normal.  He can discuss this with his nephrologist at their visit today.  The rest of his labs are all stable.

## 2023-09-07 ENCOUNTER — Encounter: Payer: PPO | Admitting: Physical Therapy

## 2023-09-07 ENCOUNTER — Other Ambulatory Visit (HOSPITAL_COMMUNITY): Payer: Self-pay

## 2023-09-07 ENCOUNTER — Other Ambulatory Visit: Payer: Self-pay | Admitting: *Deleted

## 2023-09-07 ENCOUNTER — Encounter: Payer: Self-pay | Admitting: Family Medicine

## 2023-09-07 DIAGNOSIS — D72829 Elevated white blood cell count, unspecified: Secondary | ICD-10-CM

## 2023-09-07 NOTE — Telephone Encounter (Signed)
Spoke with patient, lab appt schedule  Lab order placed

## 2023-09-08 ENCOUNTER — Encounter: Payer: Self-pay | Admitting: Family Medicine

## 2023-09-08 ENCOUNTER — Other Ambulatory Visit (INDEPENDENT_AMBULATORY_CARE_PROVIDER_SITE_OTHER): Payer: PPO

## 2023-09-08 DIAGNOSIS — D72829 Elevated white blood cell count, unspecified: Secondary | ICD-10-CM

## 2023-09-08 LAB — CBC WITH DIFFERENTIAL/PLATELET
Basophils Absolute: 0.1 10*3/uL (ref 0.0–0.1)
Basophils Relative: 0.8 % (ref 0.0–3.0)
Eosinophils Absolute: 0.2 10*3/uL (ref 0.0–0.7)
Eosinophils Relative: 2.2 % (ref 0.0–5.0)
HCT: 43.7 % (ref 39.0–52.0)
Hemoglobin: 14.5 g/dL (ref 13.0–17.0)
Lymphocytes Relative: 21.9 % (ref 12.0–46.0)
Lymphs Abs: 1.8 10*3/uL (ref 0.7–4.0)
MCHC: 33.3 g/dL (ref 30.0–36.0)
MCV: 96.3 fL (ref 78.0–100.0)
Monocytes Absolute: 0.8 10*3/uL (ref 0.1–1.0)
Monocytes Relative: 9.7 % (ref 3.0–12.0)
Neutro Abs: 5.3 10*3/uL (ref 1.4–7.7)
Neutrophils Relative %: 65.4 % (ref 43.0–77.0)
Platelets: 374 10*3/uL (ref 150.0–400.0)
RBC: 4.54 Mil/uL (ref 4.22–5.81)
RDW: 14.2 % (ref 11.5–15.5)
WBC: 8.2 10*3/uL (ref 4.0–10.5)

## 2023-09-08 NOTE — Progress Notes (Signed)
Urine culture is negative.  He does not have a UTI.

## 2023-09-08 NOTE — Progress Notes (Signed)
Blood counts are back to normal.  We can recheck in a few months.  Do not need to do any other testing at this point.

## 2023-09-09 ENCOUNTER — Encounter: Payer: PPO | Admitting: Physical Therapy

## 2023-09-09 LAB — BASIC METABOLIC PANEL
BUN: 20 (ref 4–21)
CO2: 28 — AB (ref 13–22)
Chloride: 93 — AB (ref 99–108)
Creatinine: 0.8 (ref 0.6–1.3)
Glucose: 93
Potassium: 5.3 meq/L — AB (ref 3.5–5.1)
Sodium: 127 — AB (ref 137–147)

## 2023-09-09 LAB — COMPREHENSIVE METABOLIC PANEL
Albumin: 4.1 (ref 3.5–5.0)
Calcium: 9.5 (ref 8.7–10.7)
eGFR: 88

## 2023-09-11 ENCOUNTER — Encounter: Payer: PPO | Admitting: Physical Therapy

## 2023-09-15 ENCOUNTER — Ambulatory Visit: Payer: PPO | Attending: Family Medicine | Admitting: Physical Therapy

## 2023-09-15 ENCOUNTER — Ambulatory Visit: Payer: PPO | Admitting: Physical Therapy

## 2023-09-15 ENCOUNTER — Encounter: Payer: Self-pay | Admitting: Physical Therapy

## 2023-09-15 DIAGNOSIS — R262 Difficulty in walking, not elsewhere classified: Secondary | ICD-10-CM | POA: Diagnosis not present

## 2023-09-15 DIAGNOSIS — M6281 Muscle weakness (generalized): Secondary | ICD-10-CM | POA: Diagnosis not present

## 2023-09-15 DIAGNOSIS — R2689 Other abnormalities of gait and mobility: Secondary | ICD-10-CM | POA: Diagnosis not present

## 2023-09-15 NOTE — Therapy (Signed)
 OUTPATIENT PHYSICAL THERAPY LOWER EXTREMITY TREATMENT/ RE-ASSESSMENT/ RE-CERTIFICATION    Patient Name: Jeffrey [name] MRN: [id number] DOB:[birth year], 68 y.o., male Today's Date: 09/15/2023    END OF SESSION:  PT End of Session - 09/15/23 1715     Visit Number 12    Date for PT Re-Evaluation 11/10/23    Authorization Type HTA    Progress Note Due on Visit 20    PT Start Time 1530    PT Stop Time 1608    PT Time Calculation (min) 38 min    Activity Tolerance Patient tolerated treatment well    Behavior During Therapy [facility] for tasks assessed/performed                  Past Medical History:  Diagnosis Date   Cardiac arrest ([name]) 05/22/2016   Cataract    CHF (congestive heart failure) ([name])    CKD (chronic kidney disease) stage 3, GFR 30-59 ml/min ([name]) 05/28/2021   CKD (chronic kidney disease) stage 4, GFR 15-29 ml/min ([name]) 05/28/2021   Diabetes mellitus without complication ([name])    type 2   Diabetic retinopathy ([name])    Hyperlipidemia    Hypertension    Memory loss    mild   Myocardial infarct ([name]) 2105   Retinopathy    Both eyes   S/P primary angioplasty with coronary stent 05/22/2016   Vitamin B12 deficiency    Vitreous hemorrhage of left eye ([name])    proliferative diabetic retinopathy   Past Surgical History:  Procedure Laterality Date   ANGIOPLASTY     2015   COLONOSCOPY     multiple   EYE SURGERY     PARS PLANA VITRECTOMY Left 03/15/2020   Procedure: PARS PLANA VITRECTOMY WITH 25 GAUGE;  Surgeon: Jarold Mayo, MD;  Location: [facility] OR;  Service: Ophthalmology;  Laterality: Left;   PHOTOCOAGULATION WITH LASER Left 03/15/2020   Procedure: PHOTOCOAGULATION WITH LASER; INTRAVITREAL INJECTION OF AVASTIN ;  Surgeon: Jarold Mayo, MD;  Location: [facility] OR;  Service: Ophthalmology;  Laterality: Left;   REFRACTIVE SURGERY     UPPER GI ENDOSCOPY     several   VITRECTOMY     Patient Active Problem List   Diagnosis Date Noted   Rhinitis 05/20/2023   Cervical  spondylosis 08/26/2022   CKD (chronic kidney disease) stage 4, GFR 15-29 ml/min ([name]) 05/28/2021   Adjustment disorder 01/22/2021   Chronic back pain 10/22/2020   Peripheral vertigo 05/15/2020   Dermatitis 01/04/2020   Unintentional weight loss with loose stools 05/19/2019   Low vitamin B12 level 03/29/2019   Heme positive stool 03/14/2019   Normocytic anemia 03/14/2019   Chronic diastolic heart failure ([name]) 02/08/2019   Diabetic retinopathy ([name]) 09/08/2018   Physical debility 05/24/2018   Varicose veins of both lower extremities 03/01/2018   Fatigue due to exertion 10/14/2017   GERD (gastroesophageal reflux disease) 08/25/2017   CAD in native artery 04/14/2017   Hyperlipidemia associated with type 2 diabetes mellitus ([name]) 04/14/2017   Diabetic peripheral neuropathy associated with type 2 diabetes mellitus ([name]) 08/17/2016   [name] 05/02/2016   Hypertension associated with diabetes ([name]) 05/02/2016    PCP: Kennyth Worth HERO, MD   REFERRING PROVIDER: Kennyth Worth HERO, MD   REFERRING DIAG: R53.81 (ICD-10-CM) - Physical debility   THERAPY DIAG:  Muscle weakness (generalized)  Other abnormalities of gait and mobility  Difficulty in walking, not elsewhere classified  Rationale for Evaluation and Treatment: Rehabilitation  ONSET DATE: 2022  SUBJECTIVE:  SUBJECTIVE STATEMENT: Patient reports this have be so so. He feels he is back to where he started in terms of his balance and walking tolerance. He feels he does not have energy. No falls since he was last in therapy. He still requires assistance from his brother for dressing, showering, etc. His goal is to be secure enough to gain independence.   From eval: Patient has been to two different therapy places and not had good experiences. One at [facility], one private practice. I want to be able to walk without the walker and I'm having trouble getting out of chairs without the walker. Presently sits  in a dining chair. Can't sit in his recliner in his bedroom anymore. Wants to work on balance also. Joined [employer] which has a track and an elevator (on pause right now due to disability). Periodically legs shake when he is walking.   Brother: [name] identical twins  PERTINENT HISTORY: Compression fx L1 2022, CAD, CKD (stage 4), DM, cardiac arrest 2017, neuropathy, retinopathy Long history of gait abnormality beginning back in 2022. He has a history of CAD and believes that his debility began following a covid infection. In December odf 2022 he fell and suffered a compression fracture of L1.  PAIN:  Are you having pain? No  PRECAUTIONS: Fall  RED FLAGS: None   WEIGHT BEARING RESTRICTIONS: No  FALLS:  Has patient fallen in last 6 months? No and with sit to stand falls back sometimes  LIVING ENVIRONMENT: Lives with: lives with their family Lives in: House/apartment Stairs: No Has following equipment at home: Single point cane, Environmental Consultant - 2 wheeled, Environmental Consultant - 4 wheeled, shower chair, Grab bars, and stand up walker also; also has shower chair at sink for shaving, brushing teeth etc, also has transporter.  OCCUPATION: retired  PLOF: Independent with household mobility with device  PATIENT GOALS: to walk without a walker  NEXT MD VISIT: 09/28/22  OBJECTIVE:  Note: Objective measures were completed at Evaluation unless otherwise noted.  DIAGNOSTIC FINDINGS: N/A  COGNITION: Overall cognitive status: Within functional limits for tasks assessed     SENSATION: Neuropathy in B feet   MUSCLE LENGTH: Marked HS R>L, hip flexor tightness   POSTURE: rounded shoulders and forward head  LOWER EXTREMITY ROM: WFL for tasks assessed  LOWER EXTREMITY MMT: *tested in hooklying  MMT Right eval Left eval  Hip flexion 4 4  Hip extension Able to bridge Able to bridge  Hip abduction 4-* 4-*  Hip adduction    Hip internal rotation    Hip external rotation    Knee flexion    Knee  extension 4 4+  Ankle dorsiflexion 4+ 4-  Ankle plantarflexion    Ankle inversion    Ankle eversion     (Blank rows = not tested)   FUNCTIONAL TESTS:  30 seconds chair stand test Timed up and go (TUG): 59.36 with 4WW Stance time: 45 sec patient reports unsteadiness and feels he goes backwards 07/27/2023 92 ft decreased cadence; decreased step length; absent heel strike  12/26: 5x STS: unable to rise without heavy UE use; attempted to use armrests for test but does not come all the way up;  next trial with chair +cushion to touch at least one RW handle 48.2 sec:   TUG: cushion in seat +arm use and RW:  52.49 sec  08/13/23 attempted 5x STS but pt unable to complete: 1st rep does not come all the way up and then plops heavily into the  transport wheelchair (he then recounts how this happened at home and how the chair tipped backwards) unable to complete reps Unable to complete TUG today, he does not have his 4 wheeled walker today and feels he is slower with the clinic RW  09/15/2023 TUG: 1:03 with rollator 5STS:48.55 sec heavy reliance on UE support; last repetition was a challenge  GAIT: Distance walked: 25 Assistive device utilized: Walker - 4 wheeled Level of assistance: Modified independence Comments: slow speed, decreased step and stride, decreased heel strike, forward trunk lean.  TRANSFERS: Sit to stand: Uses walker to pull up; stand to sit: does not fully back up to chair and does not reach back; Supine to sit: needs min A for log roll  (has some back pain with sit <>supine due to sitting upward from supine) also fear of falling from bed, sit to supine needs min A to lift legs on to mat table  TODAY'S TREATMENT:                                                                                                                              DATE:   09/15/2023: TUG:1:03 with rollator 5STS: 5STS:48.55 sec heavy reliance on UE support; last repetition was a challenge NuStep Level 1  5 minutes- PT present to discuss status Seated LAQ 1.5# AW 2 x 10 bilateral  Seated punches 1# DB x 15 total Seated knee flexion with slider 1.5# AW x 10 Review of HEP     08/14/2023: Needs mod assist to transfer from transport chair to Nu-Step (gait belt on) NuStep Level 1 7 minutes- PT present to discuss status-- needs assist to put Les on pedals to start Pt has only transport chair: Used clinic RW to ambulate between equipment Leg press seat 7 (extra recline) 65# bil  2 sets x 10 (1 minute rest between sets) feels harder today I'm weaker quite challenging; 30# single leg 10x right/left (needs max assist to place feet on platform today) some low back pain reported  Cable pulls/trunk extension (sitting in front 1/2 of seat) 10# seated 2 x 10 (in transport chair) pt put on gloves for 2nd set due to hand discomfort Chair sit ups holding 5# at chest 10x  Cable pulleys assisted sit to stand 100# counter balance 5x   08/13/2023: NuStep Level 1 6 minutes- PT present to discuss status Pt has only transport chair: Used clinic RW to ambulate between equipment Leg press seat 7 (extra recline) 65# bil 2 x 10; 30# single leg 15x right/left (cues to extend all the way out and control descent) able to move Les on and off the footplate without assist and rise from the seat using footplate to pull up from single leg Cable pulls 10# seated 2 x 10 (in transport chair) Seated 5# kettlebell reach down toward floor and then pull up to chest 2x 10 LAQ 5# 8x  right/ 5x on left (very fatiguing) Attempted 5x STS test  however pt has great difficulty  Seated cable overhead lat pulls  x 10  10# (feel it in my wrists today) Cable pulleys assisted sit to stand 100# counter balance 5x (worked well)  08/10/23: NuStep Level 1 6 minutes- PT present to discuss status Leg press (sit and pivot)seat 7 (extra recline) 65# bil 2 x 10 ; no single leg today since patient felt very weak  Cable pulls 10# seated 2 x 10 Seated  cable overhead lat pulls 2 x 10  10# Standing heel raises at barre x 12  6 inch step taps x 10 bilateral UE support Seated shoulder ER 2 x 10 Sit to stand x 5 with UE support max verbal cues for hip extension       PATIENT EDUCATION:  Education details: PT eval findings, anticipated POC, initial HEP, postural awareness, and transfer safety  Person educated: Patient and brother Education method: Explanation, Demonstration, and Handouts Education comprehension: verbalized understanding and returned demonstration  HOME EXERCISE PROGRAM: Access Code: [email or web address] URL: [email or web address] Date: 09/15/2023 Prepared by: Kristeen Sar  Exercises - Sit to Stand with Counter Support  - 3 x daily - 3-4 x weekly - 1 sets - 1-5 reps - Hooklying Clamshell with Resistance  - 2 x daily - 3-4 x weekly - 1 sets - 10 reps - Seated Long Arc Quad  - 1 x daily - 7 x weekly - 1-3 sets - 10 reps - 5 sec hold - Standing Hip Abduction with Counter Support  - 1 x daily - 7 x weekly - 1-2 sets - 10 reps - Standing March with Counter Support  - 1 x daily - 7 x weekly - 1-2 sets - 10 reps - Standing Heel Raise with Support  - 1 x daily - 7 x weekly - 1-2 sets - 10 reps - Shoulder External Rotation and Scapular Retraction with Resistance  - 1 x daily - 7 x weekly - 2 sets - 10 reps - Sit to Stand with Armchair  - 1 x daily - 7 x weekly - 1 sets - 5 reps  Patient Education - Tips to reduce freezing episodes with standing or walking  Patient Education - Tips to reduce freezing episodes with standing or walking  ASSESSMENT:  CLINICAL IMPRESSION: Re-certification/ Re-assessment completed today. It has been over a month since [place]'s last therapy appointment due to traveling out of state. He verbalized feeling like he is back to square one with his walking, balance, and overall endurance. Patient on reassessment of functional tests patient's times have decreased since they were last administered.  Opted to perform more seated exercises since patient has not been active and doing HEP since January. Reviewed patient's HEP with him and add more seated exercises due to patient's more endurance levels. Treatment session was cut short due to patient's increased fatigued levels. When approaching a chair or crossing a threshold noted instances of freezing of gait. Patient will benefit from skilled PT to address the below impairments and improve overall function.    OBJECTIVE IMPAIRMENTS: Abnormal gait, decreased balance, decreased ROM, decreased strength, decreased safety awareness, dizziness, impaired flexibility, impaired sensation, postural dysfunction, and pain.   ACTIVITY LIMITATIONS: carrying, lifting, bending, standing, squatting, stairs, transfers, bed mobility, continence, bathing, and locomotion level  PARTICIPATION LIMITATIONS: shopping and community activity  PERSONAL FACTORS: Age, Fitness, Time since onset of injury/illness/exacerbation, and 3+ comorbidities: Compression fx L1 2022, CAD, CKD (stage 4), neuropathy, retinopathy  are also affecting patient's functional outcome.   REHAB  POTENTIAL: Good  CLINICAL DECISION MAKING: Unstable/unpredictable  EVALUATION COMPLEXITY: High   GOALS: Goals reviewed with patient? Yes  SHORT TERM GOALS: Target date: 07/30/23 Improved TUG to < 25 sec to decrease fall risk.  Baseline: Goal status: IN PROGRESS 09/15/2023  2.  Able to perform 5XSTS without UE assist and controlled descent showing improved LE strength.  Baseline:  Goal status: IN PROGRESS 09/15/2023  3.  Patient will be able to stand safely for 2 min without UE assist Baseline:  Goal status:IN PROGRESS 09/15/2023  LONG TERM GOALS: Target date: 11/10/2023  Ind with advanced HEP Baseline:  Goal status: IN PROGRESS 09/15/2023  2. Patient will be able to ambulate 150' with LRAD with good safety to access community.  Baseline:  Goal status: IN PROGRESS 09/15/2023  3.  Patient able  to perform bed mobility with SBA Baseline:  Goal status: IN PROGRESS 09/15/2023  4.  Improved 30 sec chair test to 5 reps  Baseline:  Goal status: IN PROGRESS 09/15/2023  5.  Patient able to get in and out of his recliner at home with good control and no LOB Baseline:  Goal status: IN PROGRESS 09/15/2023    PLAN:  PT FREQUENCY:  2-3x week  PT DURATION: 8 weeks  PLANNED INTERVENTIONS: 97164- PT Re-evaluation, 97110-Therapeutic exercises, 97530- Therapeutic activity, 97112- Neuromuscular re-education, 97535- Self Care, [id number]- Manual therapy, [phone]- Gait training, [phone]- Aquatic Therapy, 97014- Electrical stimulation (unattended), Patient/Family education, Balance training, Stair training, Taping, Joint mobilization, Spinal mobilization, DME instructions, Cryotherapy, and Moist heat  PLAN FOR NEXT SESSION:  assess HEP compliance; work on standing tolerance; watch for quick fatigue/overfatigue so that patient has energy to get into car and home after PT;   sit to stands with pulley counter weight to assist; standing tolerance; gluteal strengthening; when stronger: do stairs (relies on elevator where he lives but feels he should be able to do the stairs in case of emergency)  Kristeen Sar, PT 09/15/23 5:16 PM

## 2023-09-17 ENCOUNTER — Encounter: Payer: PPO | Admitting: Physical Therapy

## 2023-09-18 ENCOUNTER — Encounter: Payer: Self-pay | Admitting: Family Medicine

## 2023-09-22 ENCOUNTER — Ambulatory Visit: Payer: PPO | Admitting: Physical Therapy

## 2023-09-22 ENCOUNTER — Encounter: Payer: Self-pay | Admitting: Physical Therapy

## 2023-09-22 ENCOUNTER — Encounter: Payer: PPO | Admitting: Physical Therapy

## 2023-09-22 DIAGNOSIS — R2689 Other abnormalities of gait and mobility: Secondary | ICD-10-CM

## 2023-09-22 DIAGNOSIS — R262 Difficulty in walking, not elsewhere classified: Secondary | ICD-10-CM

## 2023-09-22 DIAGNOSIS — M6281 Muscle weakness (generalized): Secondary | ICD-10-CM

## 2023-09-22 NOTE — Therapy (Signed)
OUTPATIENT PHYSICAL THERAPY LOWER EXTREMITY TREATMENT  Patient Name: Jeffrey [name] MRN: [id number] DOB:[birth year], 68 y.o., male Today's Date: 09/22/2023    END OF SESSION:  PT End of Session - 09/22/23 1403     Visit Number 13    Date for PT Re-Evaluation 11/10/23    Authorization Type HTA    Progress Note Due on Visit 20    PT Start Time 1233    PT Stop Time 1314    PT Time Calculation (min) 41 min    Activity Tolerance Patient tolerated treatment well    Behavior During Therapy [facility] for tasks assessed/performed                   Past Medical History:  Diagnosis Date   Cardiac arrest ([name]) 05/22/2016   Cataract    CHF (congestive heart failure) ([name])    CKD (chronic kidney disease) stage 3, GFR 30-59 ml/min ([name]) 05/28/2021   CKD (chronic kidney disease) stage 4, GFR 15-29 ml/min ([name]) 05/28/2021   Diabetes mellitus without complication ([name])    type 2   Diabetic retinopathy ([name])    Hyperlipidemia    Hypertension    Memory loss    mild   Myocardial infarct ([name]) 2105   Retinopathy    Both eyes   S/P primary angioplasty with coronary stent 05/22/2016   Vitamin B12 deficiency    Vitreous hemorrhage of left eye ([name])    proliferative diabetic retinopathy   Past Surgical History:  Procedure Laterality Date   ANGIOPLASTY     2015   COLONOSCOPY     multiple   EYE SURGERY     PARS PLANA VITRECTOMY Left 03/15/2020   Procedure: PARS PLANA VITRECTOMY WITH 25 GAUGE;  Surgeon: Stephannie Li, MD;  Location: [facility] OR;  Service: Ophthalmology;  Laterality: Left;   PHOTOCOAGULATION WITH LASER Left 03/15/2020   Procedure: PHOTOCOAGULATION WITH LASER; INTRAVITREAL INJECTION OF AVASTIN;  Surgeon: Stephannie Li, MD;  Location: [facility] OR;  Service: Ophthalmology;  Laterality: Left;   REFRACTIVE SURGERY     UPPER GI ENDOSCOPY     several   VITRECTOMY     Patient Active Problem List   Diagnosis Date Noted   Rhinitis 05/20/2023   Cervical spondylosis 08/26/2022   CKD  (chronic kidney disease) stage 4, GFR 15-29 ml/min ([name]) 05/28/2021   Adjustment disorder 01/22/2021   Chronic back pain 10/22/2020   Peripheral vertigo 05/15/2020   Dermatitis 01/04/2020   Unintentional weight loss with loose stools 05/19/2019   Low vitamin B12 level 03/29/2019   Heme positive stool 03/14/2019   Normocytic anemia 03/14/2019   Chronic diastolic heart failure ([name]) 02/08/2019   Diabetic retinopathy ([name]) 09/08/2018   Physical debility 05/24/2018   Varicose veins of both lower extremities 03/01/2018   Fatigue due to exertion 10/14/2017   GERD (gastroesophageal reflux disease) 08/25/2017   CAD in native artery 04/14/2017   Hyperlipidemia associated with type 2 diabetes mellitus ([name]) 04/14/2017   Diabetic peripheral neuropathy associated with type 2 diabetes mellitus ([name]) 08/17/2016   [name] 05/02/2016   Hypertension associated with diabetes ([name]) 05/02/2016    PCP: Ardith Dark, MD   REFERRING PROVIDER: Ardith Dark, MD   REFERRING DIAG: R53.81 (ICD-10-CM) - Physical debility   THERAPY DIAG:  Muscle weakness (generalized)  Other abnormalities of gait and mobility  Difficulty in walking, not elsewhere classified  Rationale for Evaluation and Treatment: Rehabilitation  ONSET DATE: 2022  SUBJECTIVE:   SUBJECTIVE  STATEMENT: Patient reports he has been doing his HEP.  From eval: Patient has been to two different therapy places and not had good experiences. One at [facility], one private practice. I want to be able to walk without the walker and I'm having trouble getting out of chairs without the walker. Presently sits in a dining chair. Can't sit in his recliner in his bedroom anymore. Wants to work on balance also. Joined [employer] which has a track and an elevator (on pause right now due to disability). Periodically legs shake when he is walking.   Brother: Jeffrey [name] identical twins  PERTINENT HISTORY: Compression fx L1  2022, CAD, CKD (stage 4), DM, cardiac arrest 2017, neuropathy, retinopathy Long history of gait abnormality beginning back in 2022. He has a history of CAD and believes that his debility began following a covid infection. In December odf 2022 he fell and suffered a compression fracture of L1.  PAIN:  Are you having pain? No  PRECAUTIONS: Fall  RED FLAGS: None   WEIGHT BEARING RESTRICTIONS: No  FALLS:  Has patient fallen in last 6 months? No and with sit to stand falls back sometimes  LIVING ENVIRONMENT: Lives with: lives with their family Lives in: House/apartment Stairs: No Has following equipment at home: Single point cane, Environmental consultant - 2 wheeled, Environmental consultant - 4 wheeled, shower chair, Grab bars, and stand up walker also; also has shower chair at sink for shaving, brushing teeth etc, also has transporter.  OCCUPATION: retired  PLOF: Independent with household mobility with device  PATIENT GOALS: to walk without a walker  NEXT MD VISIT: 09/28/22  OBJECTIVE:  Note: Objective measures were completed at Evaluation unless otherwise noted.  DIAGNOSTIC FINDINGS: N/A  COGNITION: Overall cognitive status: Within functional limits for tasks assessed     SENSATION: Neuropathy in B feet   MUSCLE LENGTH: Marked HS R>L, hip flexor tightness   POSTURE: rounded shoulders and forward head  LOWER EXTREMITY ROM: WFL for tasks assessed  LOWER EXTREMITY MMT: *tested in hooklying  MMT Right eval Left eval  Hip flexion 4 4  Hip extension Able to bridge Able to bridge  Hip abduction 4-* 4-*  Hip adduction    Hip internal rotation    Hip external rotation    Knee flexion    Knee extension 4 4+  Ankle dorsiflexion 4+ 4-  Ankle plantarflexion    Ankle inversion    Ankle eversion     (Blank rows = not tested)   FUNCTIONAL TESTS:  30 seconds chair stand test Timed up and go (TUG): 59.36 with 4WW Stance time: 45 sec patient reports unsteadiness and feels he goes  backwards 07/27/2023 92 ft decreased cadence; decreased step length; absent heel strike  12/26: 5x STS: unable to rise without heavy UE use; attempted to use armrests for test but does not come all the way up;  next trial with chair +cushion to touch at least one RW handle 48.2 sec:   TUG: cushion in seat +arm use and RW:  52.49 sec  08/13/23 attempted 5x STS but pt unable to complete: 1st rep does not come all the way up and then plops heavily into the transport wheelchair (he then recounts how this happened at home and how the chair tipped backwards) unable to complete reps Unable to complete TUG today, he does not have his 4 wheeled walker today and feels he is slower with the clinic RW  09/15/2023 TUG: 1:03 with rollator 5STS:48.55 sec heavy reliance on  UE support; last repetition was a challenge  GAIT: Distance walked: 25 Assistive device utilized: Walker - 4 wheeled Level of assistance: Modified independence Comments: slow speed, decreased step and stride, decreased heel strike, forward trunk lean.  TRANSFERS: Sit to stand: Uses walker to pull up; stand to sit: does not fully back up to chair and does not reach back; Supine to sit: needs min A for log roll  (has some back pain with sit <>supine due to sitting upward from supine) also fear of falling from bed, sit to supine needs min A to lift legs on to mat table  TODAY'S TREATMENT:                                                                                                                              DATE:   09/22/2023: Standing marching at barre x 10 bilateral  Standing heel raise x 10 5:30 min walk around ortho gym with front wheeled walker Seated abduction with green TB x 20 Seated knee flexion with green TB 2 x 10 bilateral  Narrow BOS on airex + sticky note number tap with PT verbalizing number Seated shoulder abduction with green TB 2 x 10 4inch step taps finger tip hand hold at barre Seated shoulder raises 2# DB 2  x 10 Seated biceps curls 2# DB 2 x 10 Seated chest press 4# 2 x 10 Seated LAQ 2 x 10    09/15/2023: TUG:1:03 with rollator 5STS: 5STS:48.55 sec heavy reliance on UE support; last repetition was a challenge NuStep Level 1 5 minutes- PT present to discuss status Seated LAQ 1.5# AW 2 x 10 bilateral  Seated punches 1# DB x 15 total Seated knee flexion with slider 1.5# AW x 10 Review of HEP     08/14/2023: Needs mod assist to transfer from transport chair to Nu-Step (gait belt on) NuStep Level 1 7 minutes- PT present to discuss status-- needs assist to put Les on pedals to start Pt has only transport chair: Used clinic RW to ambulate between equipment Leg press seat 7 (extra recline) 65# bil  2 sets x 10 (1 minute rest between sets) "feels harder today I'm weaker" quite challenging; 30# single leg 10x right/left (needs max assist to place feet on platform today) some low back pain reported  Cable pulls/trunk extension (sitting in front 1/2 of seat) 10# seated 2 x 10 (in transport chair) pt put on gloves for 2nd set due to hand discomfort Chair sit ups holding 5# at chest 10x  Cable pulleys assisted sit to stand 100# counter balance 5x   08/13/2023: NuStep Level 1 6 minutes- PT present to discuss status Pt has only transport chair: Used clinic RW to ambulate between equipment Leg press seat 7 (extra recline) 65# bil 2 x 10; 30# single leg 15x right/left (cues to extend all the way out and control descent) able to move Les on and off the footplate without assist and rise  from the seat using footplate to pull up from single leg Cable pulls 10# seated 2 x 10 (in transport chair) Seated 5# kettlebell reach down toward floor and then pull up to chest 2x 10 LAQ 5# 8x  right/ 5x on left (very fatiguing) Attempted 5x STS test however pt has great difficulty  Seated cable overhead lat pulls  x 10  10# (feel it in my wrists today) Cable pulleys assisted sit to stand 100# counter balance 5x (worked  well)        PATIENT EDUCATION:  Education details: PT eval findings, anticipated POC, initial HEP, postural awareness, and transfer safety  Person educated: Patient and brother Education method: Explanation, Demonstration, and Handouts Education comprehension: verbalized understanding and returned demonstration  HOME EXERCISE PROGRAM: Access Code: [email or web address] URL: [email or web address] Date: 09/15/2023 Prepared by: Claude Manges  Exercises - Sit to Stand with Counter Support  - 3 x daily - 3-4 x weekly - 1 sets - 1-5 reps - Hooklying Clamshell with Resistance  - 2 x daily - 3-4 x weekly - 1 sets - 10 reps - Seated Long Arc Quad  - 1 x daily - 7 x weekly - 1-3 sets - 10 reps - 5 sec hold - Standing Hip Abduction with Counter Support  - 1 x daily - 7 x weekly - 1-2 sets - 10 reps - Standing March with Counter Support  - 1 x daily - 7 x weekly - 1-2 sets - 10 reps - Standing Heel Raise with Support  - 1 x daily - 7 x weekly - 1-2 sets - 10 reps - Shoulder External Rotation and Scapular Retraction with Resistance  - 1 x daily - 7 x weekly - 2 sets - 10 reps - Sit to Stand with Armchair  - 1 x daily - 7 x weekly - 1 sets - 5 reps  Patient Education - Tips to reduce freezing episodes with standing or walking   ASSESSMENT:  CLINICAL IMPRESSION: Today's treatment session focused on standing endurance and general strengthening. Naftoli verbalized compliance with HEP and he notices a difference when he does them. Noted improved standing tolerance compared to previous treatment session. Patient wants to walk better but he verbalized not always having the confidence when he is walking. Patient did not have any freezing of gait during walk around therapy gym. Educated patient that getting better takes time, and he needs to set attainable goals in terms of his functional activity. Patient will benefit from skilled PT to address the below impairments and improve overall  function.     OBJECTIVE IMPAIRMENTS: Abnormal gait, decreased balance, decreased ROM, decreased strength, decreased safety awareness, dizziness, impaired flexibility, impaired sensation, postural dysfunction, and pain.   ACTIVITY LIMITATIONS: carrying, lifting, bending, standing, squatting, stairs, transfers, bed mobility, continence, bathing, and locomotion level  PARTICIPATION LIMITATIONS: shopping and community activity  PERSONAL FACTORS: Age, Fitness, Time since onset of injury/illness/exacerbation, and 3+ comorbidities: Compression fx L1 2022, CAD, CKD (stage 4), neuropathy, retinopathy  are also affecting patient's functional outcome.   REHAB POTENTIAL: Good  CLINICAL DECISION MAKING: Unstable/unpredictable  EVALUATION COMPLEXITY: High   GOALS: Goals reviewed with patient? Yes  SHORT TERM GOALS: Target date: 07/30/23 Improved TUG to < 25 sec to decrease fall risk.  Baseline: Goal status: IN PROGRESS 09/15/2023  2.  Able to perform 5XSTS without UE assist and controlled descent showing improved LE strength.  Baseline:  Goal status: IN PROGRESS 09/15/2023  3.  Patient will be able to stand safely  for 2 min without UE assist Baseline:  Goal status:IN PROGRESS 09/15/2023  LONG TERM GOALS: Target date: 11/10/2023  Ind with advanced HEP Baseline:  Goal status: IN PROGRESS 09/15/2023  2. Patient will be able to ambulate 150' with LRAD with good safety to access community.  Baseline:  Goal status: IN PROGRESS 09/15/2023  3.  Patient able to perform bed mobility with SBA Baseline:  Goal status: IN PROGRESS 09/15/2023  4.  Improved 30 sec chair test to 5 reps  Baseline:  Goal status: IN PROGRESS 09/15/2023  5.  Patient able to get in and out of his recliner at home with good control and no LOB Baseline:  Goal status: IN PROGRESS 09/15/2023    PLAN:  PT FREQUENCY:  2-3x week  PT DURATION: 8 weeks  PLANNED INTERVENTIONS: 97164- PT Re-evaluation, 97110-Therapeutic exercises,  97530- Therapeutic activity, 97112- Neuromuscular re-education, 97535- Self Care, 91478- Manual therapy, [phone]- Gait training, [phone]- Aquatic Therapy, 97014- Electrical stimulation (unattended), Patient/Family education, Balance training, Stair training, Taping, Joint mobilization, Spinal mobilization, DME instructions, Cryotherapy, and Moist heat  PLAN FOR NEXT SESSION:  leg press; continue working on standing tolerance and functional strength and endurance; assess HEP compliance; watch for quick fatigue/overfatigue so that patient has energy to get into car and home after PT  Claude Manges, PT 09/22/23 2:04 PM

## 2023-09-23 ENCOUNTER — Encounter: Payer: Self-pay | Admitting: Family Medicine

## 2023-09-23 NOTE — Telephone Encounter (Signed)
Spoke with patient, notified next appt is to F/U DM and recheck A1C  Patient will keep appt

## 2023-09-23 NOTE — Therapy (Incomplete)
OUTPATIENT PHYSICAL THERAPY LOWER EXTREMITY TREATMENT  Patient Name: Jeffrey [name] MRN: [id number] DOB:[birth year], 68 y.o., male Today's Date: 09/23/2023    END OF SESSION:          Past Medical History:  Diagnosis Date   Cardiac arrest ([name]) 05/22/2016   Cataract    CHF (congestive heart failure) ([name])    CKD (chronic kidney disease) stage 3, GFR 30-59 ml/min ([name]) 05/28/2021   CKD (chronic kidney disease) stage 4, GFR 15-29 ml/min ([name]) 05/28/2021   Diabetes mellitus without complication ([name])    type 2   Diabetic retinopathy ([name])    Hyperlipidemia    Hypertension    Memory loss    mild   Myocardial infarct ([name]) 2105   Retinopathy    Both eyes   S/P primary angioplasty with coronary stent 05/22/2016   Vitamin B12 deficiency    Vitreous hemorrhage of left eye ([name])    proliferative diabetic retinopathy   Past Surgical History:  Procedure Laterality Date   ANGIOPLASTY     2015   COLONOSCOPY     multiple   EYE SURGERY     PARS PLANA VITRECTOMY Left 03/15/2020   Procedure: PARS PLANA VITRECTOMY WITH 25 GAUGE;  Surgeon: Stephannie Li, MD;  Location: [facility] OR;  Service: Ophthalmology;  Laterality: Left;   PHOTOCOAGULATION WITH LASER Left 03/15/2020   Procedure: PHOTOCOAGULATION WITH LASER; INTRAVITREAL INJECTION OF AVASTIN;  Surgeon: Stephannie Li, MD;  Location: [facility] OR;  Service: Ophthalmology;  Laterality: Left;   REFRACTIVE SURGERY     UPPER GI ENDOSCOPY     several   VITRECTOMY     Patient Active Problem List   Diagnosis Date Noted   Rhinitis 05/20/2023   Cervical spondylosis 08/26/2022   CKD (chronic kidney disease) stage 4, GFR 15-29 ml/min ([name]) 05/28/2021   Adjustment disorder 01/22/2021   Chronic back pain 10/22/2020   Peripheral vertigo 05/15/2020   Dermatitis 01/04/2020   Unintentional weight loss with loose stools 05/19/2019   Low vitamin B12 level 03/29/2019   Heme positive stool 03/14/2019   Normocytic anemia 03/14/2019   Chronic diastolic  heart failure ([name]) 21/30/8657   Diabetic retinopathy ([name]) 09/08/2018   Physical debility 05/24/2018   Varicose veins of both lower extremities 03/01/2018   Fatigue due to exertion 10/14/2017   GERD (gastroesophageal reflux disease) 08/25/2017   CAD in native artery 04/14/2017   Hyperlipidemia associated with type 2 diabetes mellitus ([name]) 04/14/2017   Diabetic peripheral neuropathy associated with type 2 diabetes mellitus ([name]) 08/17/2016   [name] 05/02/2016   Hypertension associated with diabetes ([name]) 05/02/2016    PCP: Ardith Dark, MD   REFERRING PROVIDER: Ardith Dark, MD   REFERRING DIAG: R53.81 (ICD-10-CM) - Physical debility   THERAPY DIAG:  No diagnosis found.  Rationale for Evaluation and Treatment: Rehabilitation  ONSET DATE: 2022  SUBJECTIVE:   SUBJECTIVE STATEMENT: ***  From eval: Patient has been to two different therapy places and not had good experiences. One at [facility], one private practice. I want to be able to walk without the walker and I'm having trouble getting out of chairs without the walker. Presently sits in a dining chair. Can't sit in his recliner in his bedroom anymore. Wants to work on balance also. Joined [employer] which has a track and an elevator (on pause right now due to disability). Periodically legs shake when he is walking.   Brother: [name] identical twins  PERTINENT HISTORY: Compression fx L1 2022,  CAD, CKD (stage 4), DM, cardiac arrest 2017, neuropathy, retinopathy Long history of gait abnormality beginning back in 2022. He has a history of CAD and believes that his debility began following a covid infection. In December odf 2022 he fell and suffered a compression fracture of L1.  PAIN:  Are you having pain? No  PRECAUTIONS: Fall  RED FLAGS: None   WEIGHT BEARING RESTRICTIONS: No  FALLS:  Has patient fallen in last 6 months? No and with sit to stand falls back sometimes  LIVING  ENVIRONMENT: Lives with: lives with their family Lives in: House/apartment Stairs: No Has following equipment at home: Single point cane, Environmental consultant - 2 wheeled, Environmental consultant - 4 wheeled, shower chair, Grab bars, and stand up walker also; also has shower chair at sink for shaving, brushing teeth etc, also has transporter.  OCCUPATION: retired  PLOF: Independent with household mobility with device  PATIENT GOALS: to walk without a walker  NEXT MD VISIT: 09/28/22  OBJECTIVE:  Note: Objective measures were completed at Evaluation unless otherwise noted.  DIAGNOSTIC FINDINGS: N/A  COGNITION: Overall cognitive status: Within functional limits for tasks assessed     SENSATION: Neuropathy in B feet   MUSCLE LENGTH: Marked HS R>L, hip flexor tightness   POSTURE: rounded shoulders and forward head  LOWER EXTREMITY ROM: WFL for tasks assessed  LOWER EXTREMITY MMT: *tested in hooklying  MMT Right eval Left eval  Hip flexion 4 4  Hip extension Able to bridge Able to bridge  Hip abduction 4-* 4-*  Hip adduction    Hip internal rotation    Hip external rotation    Knee flexion    Knee extension 4 4+  Ankle dorsiflexion 4+ 4-  Ankle plantarflexion    Ankle inversion    Ankle eversion     (Blank rows = not tested)   FUNCTIONAL TESTS:  30 seconds chair stand test Timed up and go (TUG): 59.36 with 4WW Stance time: 45 sec patient reports unsteadiness and feels he goes backwards 07/27/2023 92 ft decreased cadence; decreased step length; absent heel strike  12/26: 5x STS: unable to rise without heavy UE use; attempted to use armrests for test but does not come all the way up;  next trial with chair +cushion to touch at least one RW handle 48.2 sec:   TUG: cushion in seat +arm use and RW:  52.49 sec  08/13/23 attempted 5x STS but pt unable to complete: 1st rep does not come all the way up and then plops heavily into the transport wheelchair (he then recounts how this happened at  home and how the chair tipped backwards) unable to complete reps Unable to complete TUG today, he does not have his 4 wheeled walker today and feels he is slower with the clinic RW  09/15/2023 TUG: 1:03 with rollator 5STS:48.55 sec heavy reliance on UE support; last repetition was a challenge  GAIT: Distance walked: 25 Assistive device utilized: Walker - 4 wheeled Level of assistance: Modified independence Comments: slow speed, decreased step and stride, decreased heel strike, forward trunk lean.  TRANSFERS: Sit to stand: Uses walker to pull up; stand to sit: does not fully back up to chair and does not reach back; Supine to sit: needs min A for log roll  (has some back pain with sit <>supine due to sitting upward from supine) also fear of falling from bed, sit to supine needs min A to lift legs on to mat table  TODAY'S TREATMENT:  DATE:   09/24/23 Standing marching at barre x 10 bilateral  Standing heel raise x 10 5:30 min walk around ortho gym with front wheeled walker Seated abduction with green TB x 20 Seated knee flexion with green TB 2 x 10 bilateral  Narrow BOS on airex + sticky note number tap with PT verbalizing number Seated shoulder abduction with green TB 2 x 10 4inch step taps finger tip hand hold at barre Seated shoulder raises 2# DB 2 x 10 Seated biceps curls 2# DB 2 x 10 Seated chest press 4# 2 x 10 Seated LAQ 2 x 10  09/22/2023: Standing marching at barre x 10 bilateral  Standing heel raise x 10 5:30 min walk around ortho gym with front wheeled walker Seated abduction with green TB x 20 Seated knee flexion with green TB 2 x 10 bilateral  Narrow BOS on airex + sticky note number tap with PT verbalizing number Seated shoulder abduction with green TB 2 x 10 4inch step taps finger tip hand hold at barre Seated shoulder raises 2# DB 2 x 10 Seated  biceps curls 2# DB 2 x 10 Seated chest press 4# 2 x 10 Seated LAQ 2 x 10    09/15/2023: TUG:1:03 with rollator 5STS: 5STS:48.55 sec heavy reliance on UE support; last repetition was a challenge NuStep Level 1 5 minutes- PT present to discuss status Seated LAQ 1.5# AW 2 x 10 bilateral  Seated punches 1# DB x 15 total Seated knee flexion with slider 1.5# AW x 10 Review of HEP     08/14/2023: Needs mod assist to transfer from transport chair to Nu-Step (gait belt on) NuStep Level 1 7 minutes- PT present to discuss status-- needs assist to put Les on pedals to start Pt has only transport chair: Used clinic RW to ambulate between equipment Leg press seat 7 (extra recline) 65# bil  2 sets x 10 (1 minute rest between sets) "feels harder today I'm weaker" quite challenging; 30# single leg 10x right/left (needs max assist to place feet on platform today) some low back pain reported  Cable pulls/trunk extension (sitting in front 1/2 of seat) 10# seated 2 x 10 (in transport chair) pt put on gloves for 2nd set due to hand discomfort Chair sit ups holding 5# at chest 10x  Cable pulleys assisted sit to stand 100# counter balance 5x   08/13/2023: NuStep Level 1 6 minutes- PT present to discuss status Pt has only transport chair: Used clinic RW to ambulate between equipment Leg press seat 7 (extra recline) 65# bil 2 x 10; 30# single leg 15x right/left (cues to extend all the way out and control descent) able to move Les on and off the footplate without assist and rise from the seat using footplate to pull up from single leg Cable pulls 10# seated 2 x 10 (in transport chair) Seated 5# kettlebell reach down toward floor and then pull up to chest 2x 10 LAQ 5# 8x  right/ 5x on left (very fatiguing) Attempted 5x STS test however pt has great difficulty  Seated cable overhead lat pulls  x 10  10# (feel it in my wrists today) Cable pulleys assisted sit to stand 100# counter balance 5x (worked  well)        PATIENT EDUCATION:  Education details: PT eval findings, anticipated POC, initial HEP, postural awareness, and transfer safety  Person educated: Patient and brother Education method: Explanation, Demonstration, and Handouts Education comprehension: verbalized understanding and returned demonstration  HOME  EXERCISE PROGRAM: Access Code: [email or web address] URL: [email or web address] Date: 09/15/2023 Prepared by: Claude Manges  Exercises - Sit to Stand with Counter Support  - 3 x daily - 3-4 x weekly - 1 sets - 1-5 reps - Hooklying Clamshell with Resistance  - 2 x daily - 3-4 x weekly - 1 sets - 10 reps - Seated Long Arc Quad  - 1 x daily - 7 x weekly - 1-3 sets - 10 reps - 5 sec hold - Standing Hip Abduction with Counter Support  - 1 x daily - 7 x weekly - 1-2 sets - 10 reps - Standing March with Counter Support  - 1 x daily - 7 x weekly - 1-2 sets - 10 reps - Standing Heel Raise with Support  - 1 x daily - 7 x weekly - 1-2 sets - 10 reps - Shoulder External Rotation and Scapular Retraction with Resistance  - 1 x daily - 7 x weekly - 2 sets - 10 reps - Sit to Stand with Armchair  - 1 x daily - 7 x weekly - 1 sets - 5 reps  Patient Education - Tips to reduce freezing episodes with standing or walking   ASSESSMENT:  CLINICAL IMPRESSION: ***     OBJECTIVE IMPAIRMENTS: Abnormal gait, decreased balance, decreased ROM, decreased strength, decreased safety awareness, dizziness, impaired flexibility, impaired sensation, postural dysfunction, and pain.   ACTIVITY LIMITATIONS: carrying, lifting, bending, standing, squatting, stairs, transfers, bed mobility, continence, bathing, and locomotion level  PARTICIPATION LIMITATIONS: shopping and community activity  PERSONAL FACTORS: Age, Fitness, Time since onset of injury/illness/exacerbation, and 3+ comorbidities: Compression fx L1 2022, CAD, CKD (stage 4), neuropathy, retinopathy  are also affecting patient's  functional outcome.   REHAB POTENTIAL: Good  CLINICAL DECISION MAKING: Unstable/unpredictable  EVALUATION COMPLEXITY: High   GOALS: Goals reviewed with patient? Yes  SHORT TERM GOALS: Target date: 07/30/23 Improved TUG to < 25 sec to decrease fall risk.  Baseline: Goal status: IN PROGRESS 09/15/2023  2.  Able to perform 5XSTS without UE assist and controlled descent showing improved LE strength.  Baseline:  Goal status: IN PROGRESS 09/15/2023  3.  Patient will be able to stand safely for 2 min without UE assist Baseline:  Goal status:IN PROGRESS 09/15/2023  LONG TERM GOALS: Target date: 11/10/2023  Ind with advanced HEP Baseline:  Goal status: IN PROGRESS 09/15/2023  2. Patient will be able to ambulate 150' with LRAD with good safety to access community.  Baseline:  Goal status: IN PROGRESS 09/15/2023  3.  Patient able to perform bed mobility with SBA Baseline:  Goal status: IN PROGRESS 09/15/2023  4.  Improved 30 sec chair test to 5 reps  Baseline:  Goal status: IN PROGRESS 09/15/2023  5.  Patient able to get in and out of his recliner at home with good control and no LOB Baseline:  Goal status: IN PROGRESS 09/15/2023    PLAN:  PT FREQUENCY:  2-3x week  PT DURATION: 8 weeks  PLANNED INTERVENTIONS: 97164- PT Re-evaluation, 97110-Therapeutic exercises, 97530- Therapeutic activity, 97112- Neuromuscular re-education, 97535- Self Care, 54098- Manual therapy, [phone]- Gait training, [phone]- Aquatic Therapy, 97014- Electrical stimulation (unattended), Patient/Family education, Balance training, Stair training, Taping, Joint mobilization, Spinal mobilization, DME instructions, Cryotherapy, and Moist heat  PLAN FOR NEXT SESSION:  leg press; continue working on standing tolerance and functional strength and endurance; assess HEP compliance; watch for quick fatigue/overfatigue so that patient has energy to get into car and home after PT  [employer], PT  09/23/23  8:44  PM

## 2023-09-24 ENCOUNTER — Ambulatory Visit: Payer: PPO | Admitting: Physical Therapy

## 2023-09-24 ENCOUNTER — Encounter: Payer: PPO | Admitting: Physical Therapy

## 2023-09-29 ENCOUNTER — Ambulatory Visit: Payer: PPO

## 2023-09-29 ENCOUNTER — Encounter: Payer: Self-pay | Admitting: Physical Therapy

## 2023-09-29 ENCOUNTER — Encounter: Payer: Self-pay | Admitting: Family Medicine

## 2023-09-29 ENCOUNTER — Ambulatory Visit: Payer: PPO | Admitting: Family Medicine

## 2023-09-29 ENCOUNTER — Ambulatory Visit: Payer: PPO | Admitting: Physical Therapy

## 2023-09-29 VITALS — BP 124/71 | HR 71 | Temp 97.4°F | Ht 67.0 in | Wt 170.6 lb

## 2023-09-29 DIAGNOSIS — I152 Hypertension secondary to endocrine disorders: Secondary | ICD-10-CM

## 2023-09-29 DIAGNOSIS — E1142 Type 2 diabetes mellitus with diabetic polyneuropathy: Secondary | ICD-10-CM | POA: Diagnosis not present

## 2023-09-29 DIAGNOSIS — E1159 Type 2 diabetes mellitus with other circulatory complications: Secondary | ICD-10-CM | POA: Diagnosis not present

## 2023-09-29 DIAGNOSIS — R059 Cough, unspecified: Secondary | ICD-10-CM

## 2023-09-29 DIAGNOSIS — K219 Gastro-esophageal reflux disease without esophagitis: Secondary | ICD-10-CM | POA: Diagnosis not present

## 2023-09-29 DIAGNOSIS — Z794 Long term (current) use of insulin: Secondary | ICD-10-CM | POA: Diagnosis not present

## 2023-09-29 DIAGNOSIS — I700 Atherosclerosis of aorta: Secondary | ICD-10-CM | POA: Diagnosis not present

## 2023-09-29 DIAGNOSIS — R2689 Other abnormalities of gait and mobility: Secondary | ICD-10-CM

## 2023-09-29 DIAGNOSIS — M6281 Muscle weakness (generalized): Secondary | ICD-10-CM | POA: Diagnosis not present

## 2023-09-29 DIAGNOSIS — I2510 Atherosclerotic heart disease of native coronary artery without angina pectoris: Secondary | ICD-10-CM | POA: Diagnosis not present

## 2023-09-29 DIAGNOSIS — R262 Difficulty in walking, not elsewhere classified: Secondary | ICD-10-CM

## 2023-09-29 LAB — POCT GLYCOSYLATED HEMOGLOBIN (HGB A1C): Hemoglobin A1C: 7.3 % — AB (ref 4.0–5.6)

## 2023-09-29 NOTE — Progress Notes (Signed)
   Jeffrey [name] is a 68 y.o. male who presents today for an office visit.  Assessment/Plan:  New/Acute Problems: Cough  Overall reassuring exam.  No red flags.  No signs of systemic illness.  He has not had improvement with protonix. No signs of post nasal drip. Will check CXR given that symptoms have been going on for a few months. May need referral to pulmonary depending on results.   Chronic Problems Addressed Today: [name] A1c stable at 7.3. Sugars have been at goal at home. Continue lantus 44 units daily and novolog as needed. Recheck in 3 months.   GERD (gastroesophageal reflux disease) He would like to stop protonix. No longer having GERD symptoms. They can use Pepcid as needed. They will let us know if he has any recurrence in symptoms.   Hypertension associated with diabetes ([name]) Initially elevated 145/75.  He has typically been well-controlled. 124/71 on recheck.  Will continue Coreg 3.125 mg twice daily and spironolactone 12.5 mg daily.     Subjective:  HPI:  See assessment / plan for status of chronic conditions.  He has been having a cough for the last several months. Symptoms are worse at night. No post nasal drip. He has been taking protonix but is not sure if it is helping. He is not currently having any reflux symptoms.  He has been more tired.  Does not think he is sleeping enough.  Sometimes dozes off during the day.  No fevers or chills.  Cough is productive.       Objective:  Physical Exam: BP 124/71   Pulse 71   Temp (!) 97.4 F (36.3 C) (Temporal)   Ht 5\' 7"  (1.702 m)   Wt 170 lb 9.6 oz (77.4 kg)   SpO2 99%   BMI 26.72 kg/m   Gen: No acute distress, resting comfortably CV: Regular rate and rhythm with no murmurs appreciated Pulm: Normal work of breathing, clear to auscultation bilaterally with no crackles, wheezes, or rhonchi Neuro: Grossly normal, moves all extremities Psych: Normal affect and thought content  Time  Spent: 40 minutes of total time was spent on the date of the encounter performing the following actions: chart review prior to seeing the patient including recent specialist visits, obtaining history, performing a medically necessary exam, counseling on the treatment plan, placing orders, and documenting in our EHR.        [name]. [name], MD 09/29/2023 9:44 AM

## 2023-09-29 NOTE — Patient Instructions (Addendum)
It was very nice to see you today!  We will check a chest xray today.   It is ok to stop your protonix.   Your sugar looks good today.   Please keep an eye on your blood pressure and let us know if persistently elevated.  Return in about 3 months (around 12/27/2023) for Follow Up.   Take care, Dr [name]  PLEASE NOTE:  If you had any lab tests, please let us know if you have not heard back within a few days. You may see your results on mychart before we have a chance to review them but we will give you a call once they are reviewed by [place].   If we ordered any referrals today, please let us know if you have not heard from their office within the next week.   If you had any urgent prescriptions sent in today, please check with the pharmacy within an hour of our visit to make sure the prescription was transmitted appropriately.   Please try these tips to maintain a healthy lifestyle:  Eat at least 3 REAL meals and 1-2 snacks per day.  Aim for no more than 5 hours between eating.  If you eat breakfast, please do so within one hour of getting up.   Each meal should contain half fruits/vegetables, one quarter protein, and one quarter carbs (no bigger than a computer mouse)  Cut down on sweet beverages. This includes juice, soda, and sweet tea.   Drink at least 1 glass of water with each meal and aim for at least 8 glasses per day  Exercise at least 150 minutes every week.

## 2023-09-29 NOTE — Assessment & Plan Note (Addendum)
He would like to stop protonix. No longer having GERD symptoms. They can use Pepcid as needed. They will let us know if he has any recurrence in symptoms.

## 2023-09-29 NOTE — Assessment & Plan Note (Signed)
A1c stable at 7.3. Sugars have been at goal at home. Continue lantus 44 units daily and novolog as needed. Recheck in 3 months.

## 2023-09-29 NOTE — Assessment & Plan Note (Signed)
Initially elevated 145/75.  He has typically been well-controlled. 124/71 on recheck.  Will continue Coreg 3.125 mg twice daily and spironolactone 12.5 mg daily.

## 2023-09-29 NOTE — Therapy (Signed)
OUTPATIENT PHYSICAL THERAPY LOWER EXTREMITY TREATMENT  Patient Name: Jeffrey [name] MRN: [id number] DOB:[birth year], 68 y.o., male Today's Date: 09/29/2023    END OF SESSION:  PT End of Session - 09/29/23 1358     Visit Number 14    Date for PT Re-Evaluation 11/10/23    Authorization Type HTA    Progress Note Due on Visit 20    PT Start Time 1227    PT Stop Time 1310    PT Time Calculation (min) 43 min    Activity Tolerance Patient tolerated treatment well    Behavior During Therapy [facility] for tasks assessed/performed                    Past Medical History:  Diagnosis Date   Cardiac arrest ([name]) 05/22/2016   Cataract    CHF (congestive heart failure) ([name])    CKD (chronic kidney disease) stage 3, GFR 30-59 ml/min ([name]) 05/28/2021   CKD (chronic kidney disease) stage 4, GFR 15-29 ml/min ([name]) 05/28/2021   Diabetes mellitus without complication ([name])    type 2   Diabetic retinopathy ([name])    Hyperlipidemia    Hypertension    Memory loss    mild   Myocardial infarct ([name]) 2105   Retinopathy    Both eyes   S/P primary angioplasty with coronary stent 05/22/2016   Vitamin B12 deficiency    Vitreous hemorrhage of left eye ([name])    proliferative diabetic retinopathy   Past Surgical History:  Procedure Laterality Date   ANGIOPLASTY     2015   COLONOSCOPY     multiple   EYE SURGERY     PARS PLANA VITRECTOMY Left 03/15/2020   Procedure: PARS PLANA VITRECTOMY WITH 25 GAUGE;  Surgeon: Stephannie Li, MD;  Location: [facility] OR;  Service: Ophthalmology;  Laterality: Left;   PHOTOCOAGULATION WITH LASER Left 03/15/2020   Procedure: PHOTOCOAGULATION WITH LASER; INTRAVITREAL INJECTION OF AVASTIN;  Surgeon: Stephannie Li, MD;  Location: [facility] OR;  Service: Ophthalmology;  Laterality: Left;   REFRACTIVE SURGERY     UPPER GI ENDOSCOPY     several   VITRECTOMY     Patient Active Problem List   Diagnosis Date Noted   Rhinitis 05/20/2023   Cervical spondylosis 08/26/2022   CKD  (chronic kidney disease) stage 4, GFR 15-29 ml/min ([name]) 05/28/2021   Adjustment disorder 01/22/2021   Chronic back pain 10/22/2020   Peripheral vertigo 05/15/2020   Dermatitis 01/04/2020   Unintentional weight loss with loose stools 05/19/2019   Low vitamin B12 level 03/29/2019   Heme positive stool 03/14/2019   Normocytic anemia 03/14/2019   Chronic diastolic heart failure ([name]) 02/08/2019   Diabetic retinopathy ([name]) 09/08/2018   Physical debility 05/24/2018   Varicose veins of both lower extremities 03/01/2018   Fatigue due to exertion 10/14/2017   GERD (gastroesophageal reflux disease) 08/25/2017   CAD in native artery 04/14/2017   Hyperlipidemia associated with type 2 diabetes mellitus ([name]) 04/14/2017   Diabetic peripheral neuropathy associated with type 2 diabetes mellitus ([name]) 08/17/2016   [name] 05/02/2016   Hypertension associated with diabetes ([name]) 05/02/2016    PCP: Ardith Dark, MD   REFERRING PROVIDER: Ardith Dark, MD   REFERRING DIAG: R53.81 (ICD-10-CM) - Physical debility   THERAPY DIAG:  Muscle weakness (generalized)  Other abnormalities of gait and mobility  Difficulty in walking, not elsewhere classified  Rationale for Evaluation and Treatment: Rehabilitation  ONSET DATE: 2022  SUBJECTIVE:  SUBJECTIVE STATEMENT: Patient reports he is doing good today. No new compliants.  From eval: Patient has been to two different therapy places and not had good experiences. One at [facility], one private practice. I want to be able to walk without the walker and I'm having trouble getting out of chairs without the walker. Presently sits in a dining chair. Can't sit in his recliner in his bedroom anymore. Wants to work on balance also. Joined [employer] which has a track and an elevator (on pause right now due to disability). Periodically legs shake when he is walking.   Brother: [name] identical twins  PERTINENT  HISTORY: Compression fx L1 2022, CAD, CKD (stage 4), DM, cardiac arrest 2017, neuropathy, retinopathy Long history of gait abnormality beginning back in 2022. He has a history of CAD and believes that his debility began following a covid infection. In December odf 2022 he fell and suffered a compression fracture of L1.  PAIN:  Are you having pain? No  PRECAUTIONS: Fall  RED FLAGS: None   WEIGHT BEARING RESTRICTIONS: No  FALLS:  Has patient fallen in last 6 months? No and with sit to stand falls back sometimes  LIVING ENVIRONMENT: Lives with: lives with their family Lives in: House/apartment Stairs: No Has following equipment at home: Single point cane, Environmental consultant - 2 wheeled, Environmental consultant - 4 wheeled, shower chair, Grab bars, and stand up walker also; also has shower chair at sink for shaving, brushing teeth etc, also has transporter.  OCCUPATION: retired  PLOF: Independent with household mobility with device  PATIENT GOALS: to walk without a walker  NEXT MD VISIT: 09/28/22  OBJECTIVE:  Note: Objective measures were completed at Evaluation unless otherwise noted.  DIAGNOSTIC FINDINGS: N/A  COGNITION: Overall cognitive status: Within functional limits for tasks assessed     SENSATION: Neuropathy in B feet   MUSCLE LENGTH: Marked HS R>L, hip flexor tightness   POSTURE: rounded shoulders and forward head  LOWER EXTREMITY ROM: WFL for tasks assessed  LOWER EXTREMITY MMT: *tested in hooklying  MMT Right eval Left eval  Hip flexion 4 4  Hip extension Able to bridge Able to bridge  Hip abduction 4-* 4-*  Hip adduction    Hip internal rotation    Hip external rotation    Knee flexion    Knee extension 4 4+  Ankle dorsiflexion 4+ 4-  Ankle plantarflexion    Ankle inversion    Ankle eversion     (Blank rows = not tested)   FUNCTIONAL TESTS:  30 seconds chair stand test Timed up and go (TUG): 59.36 with 4WW Stance time: 45 sec patient reports unsteadiness and feels  he goes backwards 07/27/2023 92 ft decreased cadence; decreased step length; absent heel strike  12/26: 5x STS: unable to rise without heavy UE use; attempted to use armrests for test but does not come all the way up;  next trial with chair +cushion to touch at least one RW handle 48.2 sec:   TUG: cushion in seat +arm use and RW:  52.49 sec  08/13/23 attempted 5x STS but pt unable to complete: 1st rep does not come all the way up and then plops heavily into the transport wheelchair (he then recounts how this happened at home and how the chair tipped backwards) unable to complete reps Unable to complete TUG today, he does not have his 4 wheeled walker today and feels he is slower with the clinic RW  09/15/2023 TUG: 1:03 with rollator 5STS:48.55 sec  heavy reliance on UE support; last repetition was a challenge  GAIT: Distance walked: 25 Assistive device utilized: Walker - 4 wheeled Level of assistance: Modified independence Comments: slow speed, decreased step and stride, decreased heel strike, forward trunk lean.  TRANSFERS: Sit to stand: Uses walker to pull up; stand to sit: does not fully back up to chair and does not reach back; Supine to sit: needs min A for log roll  (has some back pain with sit <>supine due to sitting upward from supine) also fear of falling from bed, sit to supine needs min A to lift legs on to mat table  TODAY'S TREATMENT:                                                                                                                              DATE:   09/29/2023: NuStep Level 1 5 mins- PR present to discuss status 6:26 min walk around ortho gym with front wheeled walker 6inch step taps finger tip hand hold at barre x 20 unilateral support at barre Standing heel raise x 10 Marching on airex x 20 bilateral support at barre Standing in front of walking chest press with small blue ball 2 x 10 (rest in between) Seated knee flexion with green TB + slider 2 x10  bilateral  Seated shoulder raises 2# DB  x 10 Seated biceps curls 2# DB x15 Seated shoulder extension with red TB x 10    09/22/2023: Standing marching at barre x 10 bilateral  Standing heel raise x 10 5:30 min walk around ortho gym with front wheeled walker Seated abduction with green TB x 20 Seated knee flexion with green TB 2 x 10 bilateral  Narrow BOS on airex + sticky note number tap with PT verbalizing number Seated shoulder abduction with green TB 2 x 10 4inch step taps finger tip hand hold at barre Seated shoulder raises 2# DB 2 x 10 Seated biceps curls 2# DB 2 x 10 Seated chest press 4# 2 x 10 Seated LAQ 2 x 10    09/15/2023: TUG:1:03 with rollator 5STS: 5STS:48.55 sec heavy reliance on UE support; last repetition was a challenge NuStep Level 1 5 minutes- PT present to discuss status Seated LAQ 1.5# AW 2 x 10 bilateral  Seated punches 1# DB x 15 total Seated knee flexion with slider 1.5# AW x 10 Review of HEP     08/14/2023: Needs mod assist to transfer from transport chair to Nu-Step (gait belt on) NuStep Level 1 7 minutes- PT present to discuss status-- needs assist to put Les on pedals to start Pt has only transport chair: Used clinic RW to ambulate between equipment Leg press seat 7 (extra recline) 65# bil  2 sets x 10 (1 minute rest between sets) "feels harder today I'm weaker" quite challenging; 30# single leg 10x right/left (needs max assist to place feet on platform today) some low back pain reported  Cable pulls/trunk extension (sitting  in front 1/2 of seat) 10# seated 2 x 10 (in transport chair) pt put on gloves for 2nd set due to hand discomfort Chair sit ups holding 5# at chest 10x  Cable pulleys assisted sit to stand 100# counter balance 5x          PATIENT EDUCATION:  Education details: PT eval findings, anticipated POC, initial HEP, postural awareness, and transfer safety  Person educated: Patient and brother Education method: Explanation,  Demonstration, and Handouts Education comprehension: verbalized understanding and returned demonstration  HOME EXERCISE PROGRAM: Access Code: [email or web address] URL: [email or web address] Date: 09/15/2023 Prepared by: Claude Manges  Exercises - Sit to Stand with Counter Support  - 3 x daily - 3-4 x weekly - 1 sets - 1-5 reps - Hooklying Clamshell with Resistance  - 2 x daily - 3-4 x weekly - 1 sets - 10 reps - Seated Long Arc Quad  - 1 x daily - 7 x weekly - 1-3 sets - 10 reps - 5 sec hold - Standing Hip Abduction with Counter Support  - 1 x daily - 7 x weekly - 1-2 sets - 10 reps - Standing March with Counter Support  - 1 x daily - 7 x weekly - 1-2 sets - 10 reps - Standing Heel Raise with Support  - 1 x daily - 7 x weekly - 1-2 sets - 10 reps - Shoulder External Rotation and Scapular Retraction with Resistance  - 1 x daily - 7 x weekly - 2 sets - 10 reps - Sit to Stand with Armchair  - 1 x daily - 7 x weekly - 1 sets - 5 reps  Patient Education - Tips to reduce freezing episodes with standing or walking   ASSESSMENT:  CLINICAL IMPRESSION: Today's treatment session focused on standing endurance and general strengthening. Mc was able to walk for 6 minutes after a short aeorbic warm up on the Nustep. Patient required frequent verbal cues for increased step length on Rt. Noted improved standing tolerance with single leg balance activities. Overall, patient is progressing well and is motivated to get better. Patient will benefit from skilled PT to address the below impairments and improve overall function.      OBJECTIVE IMPAIRMENTS: Abnormal gait, decreased balance, decreased ROM, decreased strength, decreased safety awareness, dizziness, impaired flexibility, impaired sensation, postural dysfunction, and pain.   ACTIVITY LIMITATIONS: carrying, lifting, bending, standing, squatting, stairs, transfers, bed mobility, continence, bathing, and locomotion level  PARTICIPATION  LIMITATIONS: shopping and community activity  PERSONAL FACTORS: Age, Fitness, Time since onset of injury/illness/exacerbation, and 3+ comorbidities: Compression fx L1 2022, CAD, CKD (stage 4), neuropathy, retinopathy  are also affecting patient's functional outcome.   REHAB POTENTIAL: Good  CLINICAL DECISION MAKING: Unstable/unpredictable  EVALUATION COMPLEXITY: High   GOALS: Goals reviewed with patient? Yes  SHORT TERM GOALS: Target date: 07/30/23 Improved TUG to < 25 sec to decrease fall risk.  Baseline: Goal status: IN PROGRESS 09/15/2023  2.  Able to perform 5XSTS without UE assist and controlled descent showing improved LE strength.  Baseline:  Goal status: IN PROGRESS 09/15/2023  3.  Patient will be able to stand safely for 2 min without UE assist Baseline:  Goal status:IN PROGRESS 09/15/2023  LONG TERM GOALS: Target date: 11/10/2023  Ind with advanced HEP Baseline:  Goal status: IN PROGRESS 09/15/2023  2. Patient will be able to ambulate 150' with LRAD with good safety to access community.  Baseline:  Goal status: IN PROGRESS 09/15/2023  3.  Patient able to perform  bed mobility with SBA Baseline:  Goal status: IN PROGRESS 09/15/2023  4.  Improved 30 sec chair test to 5 reps  Baseline:  Goal status: IN PROGRESS 09/15/2023  5.  Patient able to get in and out of his recliner at home with good control and no LOB Baseline:  Goal status: IN PROGRESS 09/15/2023    PLAN:  PT FREQUENCY:  2-3x week  PT DURATION: 8 weeks  PLANNED INTERVENTIONS: 97164- PT Re-evaluation, 97110-Therapeutic exercises, 97530- Therapeutic activity, 97112- Neuromuscular re-education, 97535- Self Care, [id number]- Manual therapy, [phone]- Gait training, [phone]- Aquatic Therapy, 97014- Electrical stimulation (unattended), Patient/Family education, Balance training, Stair training, Taping, Joint mobilization, Spinal mobilization, DME instructions, Cryotherapy, and Moist heat  PLAN FOR NEXT SESSION:  continue  working on standing tolerance and functional strength and endurance; assess HEP compliance; watch for quick fatigue/overfatigue so that patient has energy to get into car and home after PT  Claude Manges, PT 09/29/23 1:59 PM

## 2023-10-01 ENCOUNTER — Ambulatory Visit: Payer: PPO

## 2023-10-01 ENCOUNTER — Encounter: Payer: PPO | Admitting: Physical Therapy

## 2023-10-02 ENCOUNTER — Encounter: Payer: Self-pay | Admitting: Family Medicine

## 2023-10-02 NOTE — Telephone Encounter (Signed)
We are still waiting on radiology to read his x-ray.  I was able to see the pictures myself however I did not see any obvious abnormalities.  Recommend referral to pulmonology at this point.  Please place referral if they are agreeable.

## 2023-10-02 NOTE — Telephone Encounter (Signed)
 See note

## 2023-10-05 ENCOUNTER — Encounter: Payer: Self-pay | Admitting: Family Medicine

## 2023-10-06 ENCOUNTER — Encounter: Payer: Self-pay | Admitting: Physical Therapy

## 2023-10-06 ENCOUNTER — Ambulatory Visit: Payer: PPO | Admitting: Physical Therapy

## 2023-10-06 ENCOUNTER — Encounter: Payer: PPO | Admitting: Physical Therapy

## 2023-10-06 DIAGNOSIS — R262 Difficulty in walking, not elsewhere classified: Secondary | ICD-10-CM

## 2023-10-06 DIAGNOSIS — M6281 Muscle weakness (generalized): Secondary | ICD-10-CM | POA: Diagnosis not present

## 2023-10-06 DIAGNOSIS — R2689 Other abnormalities of gait and mobility: Secondary | ICD-10-CM

## 2023-10-06 NOTE — Therapy (Signed)
 OUTPATIENT PHYSICAL THERAPY LOWER EXTREMITY TREATMENT  Patient Name: Jeffrey [name] MRN: [id number] DOB:1955/09/26, 68 y.o., male Today's Date: 10/06/2023    END OF SESSION:  PT End of Session - 10/06/23 1806     Visit Number 15    Date for PT Re-Evaluation 11/10/23    Authorization Type HTA    Progress Note Due on Visit 20    PT Start Time 1525    PT Stop Time 1610    PT Time Calculation (min) 45 min    Activity Tolerance Patient tolerated treatment well    Behavior During Therapy [facility] for tasks assessed/performed                     Past Medical History:  Diagnosis Date   Cardiac arrest ([name]) 05/22/2016   Cataract    CHF (congestive heart failure) ([name])    CKD (chronic kidney disease) stage 3, GFR 30-59 ml/min ([name]) 05/28/2021   CKD (chronic kidney disease) stage 4, GFR 15-29 ml/min ([name]) 05/28/2021   Diabetes mellitus without complication ([name])    type 2   Diabetic retinopathy ([name])    Hyperlipidemia    Hypertension    Memory loss    mild   Myocardial infarct ([name]) 2105   Retinopathy    Both eyes   S/P primary angioplasty with coronary stent 05/22/2016   Vitamin B12 deficiency    Vitreous hemorrhage of left eye ([name])    proliferative diabetic retinopathy   Past Surgical History:  Procedure Laterality Date   ANGIOPLASTY     2015   COLONOSCOPY     multiple   EYE SURGERY     PARS PLANA VITRECTOMY Left 03/15/2020   Procedure: PARS PLANA VITRECTOMY WITH 25 GAUGE;  Surgeon: Stephannie Li, MD;  Location: [facility] OR;  Service: Ophthalmology;  Laterality: Left;   PHOTOCOAGULATION WITH LASER Left 03/15/2020   Procedure: PHOTOCOAGULATION WITH LASER; INTRAVITREAL INJECTION OF AVASTIN;  Surgeon: Stephannie Li, MD;  Location: [facility] OR;  Service: Ophthalmology;  Laterality: Left;   REFRACTIVE SURGERY     UPPER GI ENDOSCOPY     several   VITRECTOMY     Patient Active Problem List   Diagnosis Date Noted   Rhinitis 05/20/2023   Cervical spondylosis 08/26/2022   CKD  (chronic kidney disease) stage 4, GFR 15-29 ml/min ([name]) 05/28/2021   Adjustment disorder 01/22/2021   Chronic back pain 10/22/2020   Peripheral vertigo 05/15/2020   Dermatitis 01/04/2020   Unintentional weight loss with loose stools 05/19/2019   Low vitamin B12 level 03/29/2019   Heme positive stool 03/14/2019   Normocytic anemia 03/14/2019   Chronic diastolic heart failure ([name]) 02/08/2019   Diabetic retinopathy ([name]) 09/08/2018   Physical debility 05/24/2018   Varicose veins of both lower extremities 03/01/2018   Fatigue due to exertion 10/14/2017   GERD (gastroesophageal reflux disease) 08/25/2017   CAD in native artery 04/14/2017   Hyperlipidemia associated with type 2 diabetes mellitus ([name]) 04/14/2017   Diabetic peripheral neuropathy associated with type 2 diabetes mellitus ([name]) 08/17/2016   [name] 05/02/2016   Hypertension associated with diabetes ([name]) 05/02/2016    PCP: Ardith Dark, MD   REFERRING PROVIDER: Ardith Dark, MD   REFERRING DIAG: R53.81 (ICD-10-CM) - Physical debility   THERAPY DIAG:  Muscle weakness (generalized)  Other abnormalities of gait and mobility  Difficulty in walking, not elsewhere classified  Rationale for Evaluation and Treatment: Rehabilitation  ONSET DATE: 2022  SUBJECTIVE:  SUBJECTIVE STATEMENT: Patient reports he is doing good today. He has not been able to walk a lot because of the weather.  From eval: Patient has been to two different therapy places and not had good experiences. One at [facility], one private practice. I want to be able to walk without the walker and I'm having trouble getting out of chairs without the walker. Presently sits in a dining chair. Can't sit in his recliner in his bedroom anymore. Wants to work on balance also. Joined [employer] which has a track and an elevator (on pause right now due to disability). Periodically legs shake when he is walking.   Brother: Jeffrey [name]  identical twins  PERTINENT HISTORY: Compression fx L1 2022, CAD, CKD (stage 4), DM, cardiac arrest 2017, neuropathy, retinopathy Long history of gait abnormality beginning back in 2022. He has a history of CAD and believes that his debility began following a covid infection. In December odf 2022 he fell and suffered a compression fracture of L1.  PAIN:  Are you having pain? No  PRECAUTIONS: Fall  RED FLAGS: None   WEIGHT BEARING RESTRICTIONS: No  FALLS:  Has patient fallen in last 6 months? No and with sit to stand falls back sometimes  LIVING ENVIRONMENT: Lives with: lives with their family Lives in: House/apartment Stairs: No Has following equipment at home: Single point cane, Environmental consultant - 2 wheeled, Environmental consultant - 4 wheeled, shower chair, Grab bars, and stand up walker also; also has shower chair at sink for shaving, brushing teeth etc, also has transporter.  OCCUPATION: retired  PLOF: Independent with household mobility with device  PATIENT GOALS: to walk without a walker  NEXT MD VISIT: 09/28/22  OBJECTIVE:  Note: Objective measures were completed at Evaluation unless otherwise noted.  DIAGNOSTIC FINDINGS: N/A  COGNITION: Overall cognitive status: Within functional limits for tasks assessed     SENSATION: Neuropathy in B feet   MUSCLE LENGTH: Marked HS R>L, hip flexor tightness   POSTURE: rounded shoulders and forward head  LOWER EXTREMITY ROM: WFL for tasks assessed  LOWER EXTREMITY MMT: *tested in hooklying  MMT Right eval Left eval  Hip flexion 4 4  Hip extension Able to bridge Able to bridge  Hip abduction 4-* 4-*  Hip adduction    Hip internal rotation    Hip external rotation    Knee flexion    Knee extension 4 4+  Ankle dorsiflexion 4+ 4-  Ankle plantarflexion    Ankle inversion    Ankle eversion     (Blank rows = not tested)   FUNCTIONAL TESTS:  30 seconds chair stand test Timed up and go (TUG): 59.36 with 4WW Stance time: 45 sec patient  reports unsteadiness and feels he goes backwards 07/27/2023 92 ft decreased cadence; decreased step length; absent heel strike  12/26: 5x STS: unable to rise without heavy UE use; attempted to use armrests for test but does not come all the way up;  next trial with chair +cushion to touch at least one RW handle 48.2 sec:   TUG: cushion in seat +arm use and RW:  52.49 sec  08/13/23 attempted 5x STS but pt unable to complete: 1st rep does not come all the way up and then plops heavily into the transport wheelchair (he then recounts how this happened at home and how the chair tipped backwards) unable to complete reps Unable to complete TUG today, he does not have his 4 wheeled walker today and feels he is slower with the  clinic RW  09/15/2023 TUG: 1:03 with rollator 5STS:48.55 sec heavy reliance on UE support; last repetition was a challenge  GAIT: Distance walked: 25 Assistive device utilized: Walker - 4 wheeled Level of assistance: Modified independence Comments: slow speed, decreased step and stride, decreased heel strike, forward trunk lean.  TRANSFERS: Sit to stand: Uses walker to pull up; stand to sit: does not fully back up to chair and does not reach back; Supine to sit: needs min A for log roll  (has some back pain with sit <>supine due to sitting upward from supine) also fear of falling from bed, sit to supine needs min A to lift legs on to mat table  TODAY'S TREATMENT:                                                                                                                              DATE:   10/06/2023: NuStep Level 1 5 mins- PT present to discuss status Leg Press 50#  10 bilateral  x 65# x 35 bilateral  Seated shoulder rows at cable column 10# x 10; 15# x 10 Sit to stand with cable column at 100# to offset patient's weight (standing from transport chair)2 x 4 Lat Pull down seated in transport chair 25# 2 x 10 Seated abduction with yellow loop x 20 6 color cone tap PT  verbalizing cone color x 1 mins Seated LAQ 2# AW x 10 bilateral    09/29/2023: NuStep Level 1 5 mins- PT present to discuss status 6:26 min walk around ortho gym with front wheeled walker 6inch step taps finger tip hand hold at barre x 20 unilateral support at barre Standing heel raise x 10 Marching on airex x 20 bilateral support at barre Standing in front of walking chest press with small blue ball 2 x 10 (rest in between) Seated knee flexion with green TB + slider 2 x10 bilateral  Seated shoulder raises 2# DB  x 10 Seated biceps curls 2# DB x15 Seated shoulder extension with red TB x 10    09/22/2023: Standing marching at barre x 10 bilateral  Standing heel raise x 10 5:30 min walk around ortho gym with front wheeled walker Seated abduction with green TB x 20 Seated knee flexion with green TB 2 x 10 bilateral  Narrow BOS on airex + sticky note number tap with PT verbalizing number Seated shoulder abduction with green TB 2 x 10 4inch step taps finger tip hand hold at barre Seated shoulder raises 2# DB 2 x 10 Seated biceps curls 2# DB 2 x 10 Seated chest press 4# 2 x 10 Seated LAQ 2 x 10     PATIENT EDUCATION:  Education details: PT eval findings, anticipated POC, initial HEP, postural awareness, and transfer safety  Person educated: Patient and brother Education method: Explanation, Demonstration, and Handouts Education comprehension: verbalized understanding and returned demonstration  HOME EXERCISE PROGRAM: Access Code: [email or web address] URL: [email or web address] Date: 09/15/2023 Prepared by:  Dshaun Reppucci  Exercises - Sit to Stand with [employer]  - 3 x daily - 3-4 x weekly - 1 sets - 1-5 reps - Hooklying Clamshell with Resistance  - 2 x daily - 3-4 x weekly - 1 sets - 10 reps - Seated Long Arc Quad  - 1 x daily - 7 x weekly - 1-3 sets - 10 reps - 5 sec hold - Standing Hip Abduction with Counter Support  - 1 x daily - 7 x weekly - 1-2 sets - 10 reps -  Standing March with Counter Support  - 1 x daily - 7 x weekly - 1-2 sets - 10 reps - Standing Heel Raise with Support  - 1 x daily - 7 x weekly - 1-2 sets - 10 reps - Shoulder External Rotation and Scapular Retraction with Resistance  - 1 x daily - 7 x weekly - 2 sets - 10 reps - Sit to Stand with Armchair  - 1 x daily - 7 x weekly - 1 sets - 5 reps  Patient Education - Tips to reduce freezing episodes with standing or walking   ASSESSMENT:  CLINICAL IMPRESSION: Today's treatment session focused on standing endurance and general strengthening. Incorporated leg press and sit to stands at cable column to offset patient's weight. Patient demonstrated increased fatigue at end of treatment session. Educated patient on incorporating walking throughout the day to improve his balance and standing tolerance. Patient frequently verbalizes wanting to walk without and AD. PT provides education to not view AD as a crutch, but a tool to increase patient's independence.       OBJECTIVE IMPAIRMENTS: Abnormal gait, decreased balance, decreased ROM, decreased strength, decreased safety awareness, dizziness, impaired flexibility, impaired sensation, postural dysfunction, and pain.   ACTIVITY LIMITATIONS: carrying, lifting, bending, standing, squatting, stairs, transfers, bed mobility, continence, bathing, and locomotion level  PARTICIPATION LIMITATIONS: shopping and community activity  PERSONAL FACTORS: Age, Fitness, Time since onset of injury/illness/exacerbation, and 3+ comorbidities: Compression fx L1 2022, CAD, CKD (stage 4), neuropathy, retinopathy  are also affecting patient's functional outcome.   REHAB POTENTIAL: Good  CLINICAL DECISION MAKING: Unstable/unpredictable  EVALUATION COMPLEXITY: High   GOALS: Goals reviewed with patient? Yes  SHORT TERM GOALS: Target date: 07/30/23 Improved TUG to < 25 sec to decrease fall risk.  Baseline: Goal status: IN PROGRESS 09/15/2023  2.  Able to  perform 5XSTS without UE assist and controlled descent showing improved LE strength.  Baseline:  Goal status: IN PROGRESS 09/15/2023  3.  Patient will be able to stand safely for 2 min without UE assist Baseline:  Goal status:IN PROGRESS 09/15/2023  LONG TERM GOALS: Target date: 11/10/2023  Ind with advanced HEP Baseline:  Goal status: IN PROGRESS 09/15/2023  2. Patient will be able to ambulate 150' with LRAD with good safety to access community.  Baseline:  Goal status: IN PROGRESS 09/15/2023  3.  Patient able to perform bed mobility with SBA Baseline:  Goal status: IN PROGRESS 09/15/2023  4.  Improved 30 sec chair test to 5 reps  Baseline:  Goal status: IN PROGRESS 09/15/2023  5.  Patient able to get in and out of his recliner at home with good control and no LOB Baseline:  Goal status: IN PROGRESS 09/15/2023    PLAN:  PT FREQUENCY:  2-3x week  PT DURATION: 8 weeks  PLANNED INTERVENTIONS: 97164- PT Re-evaluation, 97110-Therapeutic exercises, 97530- Therapeutic activity, 97112- Neuromuscular re-education, 97535- Self Care, 25366- Manual therapy, [id number]- Gait training, [phone]- Aquatic Therapy,  52841- Electrical stimulation (unattended), Patient/Family education, Balance training, Stair training, Taping, Joint mobilization, Spinal mobilization, DME instructions, Cryotherapy, and Moist heat  PLAN FOR NEXT SESSION:  walking tolerance; step negotiation;  general strengthening  Claude Manges, PT 10/06/23 6:07 PM

## 2023-10-07 ENCOUNTER — Encounter: Payer: Self-pay | Admitting: Family Medicine

## 2023-10-08 ENCOUNTER — Encounter: Payer: PPO | Admitting: Physical Therapy

## 2023-10-08 ENCOUNTER — Ambulatory Visit: Payer: PPO | Admitting: Physical Therapy

## 2023-10-08 ENCOUNTER — Encounter: Payer: Self-pay | Admitting: Family Medicine

## 2023-10-08 NOTE — Progress Notes (Signed)
 His xray does not show any issues with his lungs.  He does have a small compression deformity in his lumbar spine but this is unrelated.  If he is still having cough recommend referral to pulmonology.

## 2023-10-08 NOTE — Telephone Encounter (Signed)
 Libre 3 no longer available; please advise on Dexcom 7 if patient agreeable

## 2023-10-08 NOTE — Telephone Encounter (Signed)
 Ok to order Dexcom?

## 2023-10-08 NOTE — Telephone Encounter (Signed)
 Ok to [employer].  [name]. [name], MD 10/08/2023 2:51 PM

## 2023-10-08 NOTE — Telephone Encounter (Signed)
 Spoke with patient, advise since he requesting Dexcon or libre 3 to call insurance to see which one they will pay for  Patient verbalized understanding

## 2023-10-09 ENCOUNTER — Encounter: Payer: Self-pay | Admitting: Family Medicine

## 2023-10-09 ENCOUNTER — Other Ambulatory Visit: Payer: Self-pay | Admitting: *Deleted

## 2023-10-09 MED ORDER — FREESTYLE LIBRE 3 READER DEVI: 0 refills | Status: DC

## 2023-10-09 MED ORDER — FREESTYLE LIBRE 3 SENSOR MISC: 3 refills | Status: DC

## 2023-10-09 NOTE — Telephone Encounter (Signed)
 Rx send to pharmacy

## 2023-10-09 NOTE — Telephone Encounter (Signed)
 Ok to send in [facility] [name], MD 10/09/2023 7:32 AM

## 2023-10-13 ENCOUNTER — Encounter: Payer: Self-pay | Admitting: Physical Therapy

## 2023-10-13 ENCOUNTER — Ambulatory Visit: Payer: PPO | Attending: Family Medicine | Admitting: Physical Therapy

## 2023-10-13 DIAGNOSIS — R2689 Other abnormalities of gait and mobility: Secondary | ICD-10-CM | POA: Diagnosis not present

## 2023-10-13 DIAGNOSIS — M6281 Muscle weakness (generalized): Secondary | ICD-10-CM | POA: Insufficient documentation

## 2023-10-13 DIAGNOSIS — R262 Difficulty in walking, not elsewhere classified: Secondary | ICD-10-CM | POA: Insufficient documentation

## 2023-10-13 NOTE — Therapy (Signed)
 OUTPATIENT PHYSICAL THERAPY LOWER EXTREMITY TREATMENT  Patient Name: Jeffrey [name] MRN: [id number] DOB:Sep 06, 1955, 68 y.o., male Today's Date: 10/13/2023    END OF SESSION:  PT End of Session - 10/13/23 1406     Visit Number 16    Date for PT Re-Evaluation 11/10/23    Authorization Type HTA    Progress Note Due on Visit 20    PT Start Time 1157   patient was late to appointment   PT Stop Time 1227    PT Time Calculation (min) 30 min                      Past Medical History:  Diagnosis Date   Cardiac arrest ([name]) 05/22/2016   Cataract    CHF (congestive heart failure) ([name])    CKD (chronic kidney disease) stage 3, GFR 30-59 ml/min ([name]) 05/28/2021   CKD (chronic kidney disease) stage 4, GFR 15-29 ml/min ([name]) 05/28/2021   Diabetes mellitus without complication ([name])    type 2   Diabetic retinopathy ([name])    Hyperlipidemia    Hypertension    Memory loss    mild   Myocardial infarct ([name]) 2105   Retinopathy    Both eyes   S/P primary angioplasty with coronary stent 05/22/2016   Vitamin B12 deficiency    Vitreous hemorrhage of left eye ([name])    proliferative diabetic retinopathy   Past Surgical History:  Procedure Laterality Date   ANGIOPLASTY     2015   COLONOSCOPY     multiple   EYE SURGERY     PARS PLANA VITRECTOMY Left 03/15/2020   Procedure: PARS PLANA VITRECTOMY WITH 25 GAUGE;  Surgeon: Stephannie Li, MD;  Location: [facility] OR;  Service: Ophthalmology;  Laterality: Left;   PHOTOCOAGULATION WITH LASER Left 03/15/2020   Procedure: PHOTOCOAGULATION WITH LASER; INTRAVITREAL INJECTION OF AVASTIN;  Surgeon: Stephannie Li, MD;  Location: [facility] OR;  Service: Ophthalmology;  Laterality: Left;   REFRACTIVE SURGERY     UPPER GI ENDOSCOPY     several   VITRECTOMY     Patient Active Problem List   Diagnosis Date Noted   Rhinitis 05/20/2023   Cervical spondylosis 08/26/2022   CKD (chronic kidney disease) stage 4, GFR 15-29 ml/min ([name]) 05/28/2021   Adjustment  disorder 01/22/2021   Chronic back pain 10/22/2020   Peripheral vertigo 05/15/2020   Dermatitis 01/04/2020   Unintentional weight loss with loose stools 05/19/2019   Low vitamin B12 level 03/29/2019   Heme positive stool 03/14/2019   Normocytic anemia 03/14/2019   Chronic diastolic heart failure ([name]) 02/08/2019   Diabetic retinopathy ([name]) 09/08/2018   Physical debility 05/24/2018   Varicose veins of both lower extremities 03/01/2018   Fatigue due to exertion 10/14/2017   GERD (gastroesophageal reflux disease) 08/25/2017   CAD in native artery 04/14/2017   Hyperlipidemia associated with type 2 diabetes mellitus ([name]) 04/14/2017   Diabetic peripheral neuropathy associated with type 2 diabetes mellitus ([name]) 08/17/2016   [name] 05/02/2016   Hypertension associated with diabetes ([name]) 05/02/2016    PCP: Ardith Dark, MD   REFERRING PROVIDER: Ardith Dark, MD   REFERRING DIAG: R53.81 (ICD-10-CM) - Physical debility   THERAPY DIAG:  Muscle weakness (generalized)  Other abnormalities of gait and mobility  Difficulty in walking, not elsewhere classified  Rationale for Evaluation and Treatment: Rehabilitation  ONSET DATE: 2022  SUBJECTIVE:   SUBJECTIVE STATEMENT: Patient reports he is doing okay today. No new  complaints  From eval: Patient has been to two different therapy places and not had good experiences. One at [facility], one private practice. I want to be able to walk without the walker and I'm having trouble getting out of chairs without the walker. Presently sits in a dining chair. Can't sit in his recliner in his bedroom anymore. Wants to work on balance also. Joined [employer] which has a track and an elevator (on pause right now due to disability). Periodically legs shake when he is walking.   Brother: [name] identical twins  PERTINENT HISTORY: Compression fx L1 2022, CAD, CKD (stage 4), DM, cardiac arrest 2017, neuropathy,  retinopathy Long history of gait abnormality beginning back in 2022. He has a history of CAD and believes that his debility began following a covid infection. In December odf 2022 he fell and suffered a compression fracture of L1.  PAIN:  Are you having pain? No  PRECAUTIONS: Fall  RED FLAGS: None   WEIGHT BEARING RESTRICTIONS: No  FALLS:  Has patient fallen in last 6 months? No and with sit to stand falls back sometimes  LIVING ENVIRONMENT: Lives with: lives with their family Lives in: House/apartment Stairs: No Has following equipment at home: Single point cane, Environmental consultant - 2 wheeled, Environmental consultant - 4 wheeled, shower chair, Grab bars, and stand up walker also; also has shower chair at sink for shaving, brushing teeth etc, also has transporter.  OCCUPATION: retired  PLOF: Independent with household mobility with device  PATIENT GOALS: to walk without a walker  NEXT MD VISIT: 09/28/22  OBJECTIVE:  Note: Objective measures were completed at Evaluation unless otherwise noted.  DIAGNOSTIC FINDINGS: N/A  COGNITION: Overall cognitive status: Within functional limits for tasks assessed     SENSATION: Neuropathy in B feet   MUSCLE LENGTH: Marked HS R>L, hip flexor tightness   POSTURE: rounded shoulders and forward head  LOWER EXTREMITY ROM: WFL for tasks assessed  LOWER EXTREMITY MMT: *tested in hooklying  MMT Right eval Left eval  Hip flexion 4 4  Hip extension Able to bridge Able to bridge  Hip abduction 4-* 4-*  Hip adduction    Hip internal rotation    Hip external rotation    Knee flexion    Knee extension 4 4+  Ankle dorsiflexion 4+ 4-  Ankle plantarflexion    Ankle inversion    Ankle eversion     (Blank rows = not tested)   FUNCTIONAL TESTS:  30 seconds chair stand test Timed up and go (TUG): 59.36 with 4WW Stance time: 45 sec patient reports unsteadiness and feels he goes backwards 07/27/2023 92 ft decreased cadence; decreased step length; absent  heel strike  12/26: 5x STS: unable to rise without heavy UE use; attempted to use armrests for test but does not come all the way up;  next trial with chair +cushion to touch at least one RW handle 48.2 sec:   TUG: cushion in seat +arm use and RW:  52.49 sec  08/13/23 attempted 5x STS but pt unable to complete: 1st rep does not come all the way up and then plops heavily into the transport wheelchair (he then recounts how this happened at home and how the chair tipped backwards) unable to complete reps Unable to complete TUG today, he does not have his 4 wheeled walker today and feels he is slower with the clinic RW  09/15/2023 TUG: 1:03 with rollator 5STS:48.55 sec heavy reliance on UE support; last repetition was a challenge  GAIT: Distance walked: 25 Assistive device utilized: Walker - 4 wheeled Level of assistance: Modified independence Comments: slow speed, decreased step and stride, decreased heel strike, forward trunk lean.  TRANSFERS: Sit to stand: Uses walker to pull up; stand to sit: does not fully back up to chair and does not reach back; Supine to sit: needs min A for log roll  (has some back pain with sit <>supine due to sitting upward from supine) also fear of falling from bed, sit to supine needs min A to lift legs on to mat table  TODAY'S TREATMENT:                                                                                                                              DATE:   10/13/2023: NuStep Level 2 5 mins- PT present to discuss status Leg Press 50#  2 x 10 bilateral   Seated chest press 4# DB 2 x 10 Seated knee flexion with green TB 2 x 10 bilateral  Standing hip marching x 10 each at barre Standing heel raises x 10 Seated LAQ 2# AW x 10 bilateral  Seated biceps curls 2# DB 2 x 10 Ended session early due to patient's fatigue    10/06/2023: NuStep Level 1 5 mins- PT present to discuss status Leg Press 50#  10 bilateral  x 65# x 35 bilateral  Seated shoulder rows  at cable column 10# x 10; 15# x 10 Sit to stand with cable column at 100# to offset patient's weight (standing from transport chair)2 x 4 Lat Pull down seated in transport chair 25# 2 x 10 Seated abduction with yellow loop x 20 6 color cone tap PT verbalizing cone color x 1 mins Seated LAQ 2# AW x 10 bilateral    09/29/2023: NuStep Level 1 5 mins- PT present to discuss status 6:26 min walk around ortho gym with front wheeled walker 6inch step taps finger tip hand hold at barre x 20 unilateral support at barre Standing heel raise x 10 Marching on airex x 20 bilateral support at barre Standing in front of walking chest press with small blue ball 2 x 10 (rest in between) Seated knee flexion with green TB + slider 2 x10 bilateral  Seated shoulder raises 2# DB  x 10 Seated biceps curls 2# DB x15 Seated shoulder extension with red TB x 10    09/22/2023: Standing marching at barre x 10 bilateral  Standing heel raise x 10 5:30 min walk around ortho gym with front wheeled walker Seated abduction with green TB x 20 Seated knee flexion with green TB 2 x 10 bilateral  Narrow BOS on airex + sticky note number tap with PT verbalizing number Seated shoulder abduction with green TB 2 x 10 4inch step taps finger tip hand hold at barre Seated shoulder raises 2# DB 2 x 10 Seated biceps curls 2# DB 2 x 10 Seated chest press 4# 2 x 10  Seated LAQ 2 x 10     PATIENT EDUCATION:  Education details: PT eval findings, anticipated POC, initial HEP, postural awareness, and transfer safety  Person educated: Patient and brother Education method: Explanation, Demonstration, and Handouts Education comprehension: verbalized understanding and returned demonstration  HOME EXERCISE PROGRAM: Access Code: [email or web address] URL: [email or web address] Date: 09/15/2023 Prepared by: Claude Manges  Exercises - Sit to Stand with Counter Support  - 3 x daily - 3-4 x weekly - 1 sets - 1-5 reps - Hooklying  Clamshell with Resistance  - 2 x daily - 3-4 x weekly - 1 sets - 10 reps - Seated Long Arc Quad  - 1 x daily - 7 x weekly - 1-3 sets - 10 reps - 5 sec hold - Standing Hip Abduction with Counter Support  - 1 x daily - 7 x weekly - 1-2 sets - 10 reps - Standing March with Counter Support  - 1 x daily - 7 x weekly - 1-2 sets - 10 reps - Standing Heel Raise with Support  - 1 x daily - 7 x weekly - 1-2 sets - 10 reps - Shoulder External Rotation and Scapular Retraction with Resistance  - 1 x daily - 7 x weekly - 2 sets - 10 reps - Sit to Stand with Armchair  - 1 x daily - 7 x weekly - 1 sets - 5 reps  Patient Education - Tips to reduce freezing episodes with standing or walking   ASSESSMENT:  CLINICAL IMPRESSION: Keithen was very fatigued today after not sleeping well last night. Noted decreased endurance compared to previous treatment sessions. Incorporated a mix of standing and sitting exercises due to patient's fatigue. Overall, patient tolerated treatment session well. He required verbal and visual cues for correct exercise performance. Patient will benefit from skilled PT to address the below impairments and improve overall function.       OBJECTIVE IMPAIRMENTS: Abnormal gait, decreased balance, decreased ROM, decreased strength, decreased safety awareness, dizziness, impaired flexibility, impaired sensation, postural dysfunction, and pain.   ACTIVITY LIMITATIONS: carrying, lifting, bending, standing, squatting, stairs, transfers, bed mobility, continence, bathing, and locomotion level  PARTICIPATION LIMITATIONS: shopping and community activity  PERSONAL FACTORS: Age, Fitness, Time since onset of injury/illness/exacerbation, and 3+ comorbidities: Compression fx L1 2022, CAD, CKD (stage 4), neuropathy, retinopathy  are also affecting patient's functional outcome.   REHAB POTENTIAL: Good  CLINICAL DECISION MAKING: Unstable/unpredictable  EVALUATION COMPLEXITY: High   GOALS: Goals  reviewed with patient? Yes  SHORT TERM GOALS: Target date: 07/30/23 Improved TUG to < 25 sec to decrease fall risk.  Baseline: Goal status: IN PROGRESS 09/15/2023  2.  Able to perform 5XSTS without UE assist and controlled descent showing improved LE strength.  Baseline:  Goal status: IN PROGRESS 09/15/2023  3.  Patient will be able to stand safely for 2 min without UE assist Baseline:  Goal status:IN PROGRESS 09/15/2023  LONG TERM GOALS: Target date: 11/10/2023  Ind with advanced HEP Baseline:  Goal status: IN PROGRESS 09/15/2023  2. Patient will be able to ambulate 150' with LRAD with good safety to access community.  Baseline:  Goal status: IN PROGRESS 09/15/2023  3.  Patient able to perform bed mobility with SBA Baseline:  Goal status: IN PROGRESS 09/15/2023  4.  Improved 30 sec chair test to 5 reps  Baseline:  Goal status: IN PROGRESS 09/15/2023  5.  Patient able to get in and out of his recliner at home with good control and no LOB Baseline:  Goal status: IN PROGRESS 09/15/2023    PLAN:  PT FREQUENCY:  2-3x week  PT DURATION: 8 weeks  PLANNED INTERVENTIONS: 97164- PT Re-evaluation, 97110-Therapeutic exercises, 97530- Therapeutic activity, 97112- Neuromuscular re-education, 97535- Self Care, 16109- Manual therapy, [phone]- Gait training, [phone]- Aquatic Therapy, [phone]- Electrical stimulation (unattended), Patient/Family education, Balance training, Stair training, Taping, Joint mobilization, Spinal mobilization, DME instructions, Cryotherapy, and Moist heat  PLAN FOR NEXT SESSION:  assess energy levels and endurance ; functional strengthening  Claude Manges, PT 10/13/23 2:07 PM

## 2023-10-15 ENCOUNTER — Ambulatory Visit: Payer: PPO | Admitting: Physical Therapy

## 2023-10-15 ENCOUNTER — Other Ambulatory Visit: Payer: Self-pay | Admitting: Family Medicine

## 2023-10-20 ENCOUNTER — Ambulatory Visit: Payer: PPO | Admitting: Physical Therapy

## 2023-10-20 DIAGNOSIS — M6281 Muscle weakness (generalized): Secondary | ICD-10-CM | POA: Diagnosis not present

## 2023-10-20 DIAGNOSIS — R2689 Other abnormalities of gait and mobility: Secondary | ICD-10-CM

## 2023-10-20 DIAGNOSIS — R262 Difficulty in walking, not elsewhere classified: Secondary | ICD-10-CM

## 2023-10-20 NOTE — Therapy (Signed)
 OUTPATIENT PHYSICAL THERAPY LOWER EXTREMITY TREATMENT  Patient Name: Jeffrey [name] MRN: [id number] DOB:15-May-1956, 68 y.o., male Today's Date: 10/20/2023    END OF SESSION:  PT End of Session - 10/20/23 1501     Visit Number 17    Date for PT Re-Evaluation 11/10/23    Authorization Type HTA    Progress Note Due on Visit 20    PT Start Time 1446    PT Stop Time 1530    PT Time Calculation (min) 44 min    Activity Tolerance Patient tolerated treatment well                      Past Medical History:  Diagnosis Date   Cardiac arrest ([name]) 05/22/2016   Cataract    CHF (congestive heart failure) ([name])    CKD (chronic kidney disease) stage 3, GFR 30-59 ml/min ([name]) 05/28/2021   CKD (chronic kidney disease) stage 4, GFR 15-29 ml/min ([name]) 05/28/2021   Diabetes mellitus without complication ([name])    type 2   Diabetic retinopathy ([name])    Hyperlipidemia    Hypertension    Memory loss    mild   Myocardial infarct ([name]) 2105   Retinopathy    Both eyes   S/P primary angioplasty with coronary stent 05/22/2016   Vitamin B12 deficiency    Vitreous hemorrhage of left eye ([name])    proliferative diabetic retinopathy   Past Surgical History:  Procedure Laterality Date   ANGIOPLASTY     2015   COLONOSCOPY     multiple   EYE SURGERY     PARS PLANA VITRECTOMY Left 03/15/2020   Procedure: PARS PLANA VITRECTOMY WITH 25 GAUGE;  Surgeon: Stephannie Li, MD;  Location: [facility] OR;  Service: Ophthalmology;  Laterality: Left;   PHOTOCOAGULATION WITH LASER Left 03/15/2020   Procedure: PHOTOCOAGULATION WITH LASER; INTRAVITREAL INJECTION OF AVASTIN;  Surgeon: Stephannie Li, MD;  Location: [facility] OR;  Service: Ophthalmology;  Laterality: Left;   REFRACTIVE SURGERY     UPPER GI ENDOSCOPY     several   VITRECTOMY     Patient Active Problem List   Diagnosis Date Noted   Rhinitis 05/20/2023   Cervical spondylosis 08/26/2022   CKD (chronic kidney disease) stage 4, GFR 15-29 ml/min ([name])  05/28/2021   Adjustment disorder 01/22/2021   Chronic back pain 10/22/2020   Peripheral vertigo 05/15/2020   Dermatitis 01/04/2020   Unintentional weight loss with loose stools 05/19/2019   Low vitamin B12 level 03/29/2019   Heme positive stool 03/14/2019   Normocytic anemia 03/14/2019   Chronic diastolic heart failure ([name]) 02/08/2019   Diabetic retinopathy ([name]) 09/08/2018   Physical debility 05/24/2018   Varicose veins of both lower extremities 03/01/2018   Fatigue due to exertion 10/14/2017   GERD (gastroesophageal reflux disease) 08/25/2017   CAD in native artery 04/14/2017   Hyperlipidemia associated with type 2 diabetes mellitus ([name]) 04/14/2017   Diabetic peripheral neuropathy associated with type 2 diabetes mellitus ([name]) 08/17/2016   [name] 05/02/2016   Hypertension associated with diabetes ([name]) 05/02/2016    PCP: Ardith Dark, MD   REFERRING PROVIDER: Ardith Dark, MD   REFERRING DIAG: R53.81 (ICD-10-CM) - Physical debility   THERAPY DIAG:  Muscle weakness (generalized)  Other abnormalities of gait and mobility  Difficulty in walking, not elsewhere classified  Rationale for Evaluation and Treatment: Rehabilitation  ONSET DATE: 2022  SUBJECTIVE:   SUBJECTIVE STATEMENT: Arrives via transport chair secondary to  fatigue from just showering before the visit.  Had x-rays which showed a lumbar compression deformity and was told by the doctor to hold on the leg press.  Pt states, I want to be able to walk and do the stairs.    From eval: Patient has been to two different therapy places and not had good experiences. One at [facility], one private practice. I want to be able to walk without the walker and I'm having trouble getting out of chairs without the walker. Presently sits in a dining chair. Can't sit in his recliner in his bedroom anymore. Wants to work on balance also. Joined [employer] which has a track and an elevator (on  pause right now due to disability). Periodically legs shake when he is walking.   Brother: [name] identical twins  PERTINENT HISTORY: 3/11: evidence of lumbar compression deformity Compression fx L1 2022, CAD, CKD (stage 4), DM, cardiac arrest 2017, neuropathy, retinopathy Long history of gait abnormality beginning back in 2022. He has a history of CAD and believes that his debility began following a covid infection. In December odf 2022 he fell and suffered a compression fracture of L1.  PAIN:  Are you having pain? No  PRECAUTIONS: Fall  RED FLAGS: None   WEIGHT BEARING RESTRICTIONS: No  FALLS:  Has patient fallen in last 6 months? No and with sit to stand falls back sometimes  LIVING ENVIRONMENT: Lives with: lives with their family Lives in: House/apartment Stairs: No Has following equipment at home: Single point cane, Environmental consultant - 2 wheeled, Environmental consultant - 4 wheeled, shower chair, Grab bars, and stand up walker also; also has shower chair at sink for shaving, brushing teeth etc, also has transporter.  OCCUPATION: retired  PLOF: Independent with household mobility with device  PATIENT GOALS: to walk without a walker  NEXT MD VISIT: 09/28/22  OBJECTIVE:  Note: Objective measures were completed at Evaluation unless otherwise noted.  DIAGNOSTIC FINDINGS: N/A  COGNITION: Overall cognitive status: Within functional limits for tasks assessed     SENSATION: Neuropathy in B feet   MUSCLE LENGTH: Marked HS R>L, hip flexor tightness   POSTURE: rounded shoulders and forward head  LOWER EXTREMITY ROM: WFL for tasks assessed  LOWER EXTREMITY MMT: *tested in hooklying  MMT Right eval Left eval  Hip flexion 4 4  Hip extension Able to bridge Able to bridge  Hip abduction 4-* 4-*  Hip adduction    Hip internal rotation    Hip external rotation    Knee flexion    Knee extension 4 4+  Ankle dorsiflexion 4+ 4-  Ankle plantarflexion    Ankle inversion    Ankle eversion      (Blank rows = not tested)   FUNCTIONAL TESTS:  30 seconds chair stand test Timed up and go (TUG): 59.36 with 4WW Stance time: 45 sec patient reports unsteadiness and feels he goes backwards 07/27/2023 92 ft decreased cadence; decreased step length; absent heel strike  12/26: 5x STS: unable to rise without heavy UE use; attempted to use armrests for test but does not come all the way up;  next trial with chair +cushion to touch at least one RW handle 48.2 sec:   TUG: cushion in seat +arm use and RW:  52.49 sec  08/13/23 attempted 5x STS but pt unable to complete: 1st rep does not come all the way up and then plops heavily into the transport wheelchair (he then recounts how this happened at home and how the  chair tipped backwards) unable to complete reps Unable to complete TUG today, he does not have his 4 wheeled walker today and feels he is slower with the clinic RW  09/15/2023 TUG: 1:03 with rollator 5STS:48.55 sec heavy reliance on UE support; last repetition was a challenge  GAIT: Distance walked: 25 Assistive device utilized: Walker - 4 wheeled Level of assistance: Modified independence Comments: slow speed, decreased step and stride, decreased heel strike, forward trunk lean.  TRANSFERS: Sit to stand: Uses walker to pull up; stand to sit: does not fully back up to chair and does not reach back; Supine to sit: needs min A for log roll  (has some back pain with sit <>supine due to sitting upward from supine) also fear of falling from bed, sit to supine needs min A to lift legs on to mat table  TODAY'S TREATMENT:                                                                                                                              DATE:   10/20/23: Seated cable rows 10# 2x 10 Seated red power cord ankle plantarflexion (held by PT) 10x right/left 100# cable assisted sit to stand 2 sets of 5 (heavy use of Ues 70%, 30% LE use) RPE 6/10 // bars: 6 laps with CGA with gait belt  verbal cues for increased step length and full trunk extension RPE "less b/c that was motivating and made me feel good"  10/13/2023: NuStep Level 2 5 mins- PT present to discuss status Leg Press 50#  2 x 10 bilateral   Seated chest press 4# DB 2 x 10 Seated knee flexion with green TB 2 x 10 bilateral  Standing hip marching x 10 each at barre Standing heel raises x 10 Seated LAQ 2# AW x 10 bilateral  Seated biceps curls 2# DB 2 x 10 Ended session early due to patient's fatigue    10/06/2023: NuStep Level 1 5 mins- PT present to discuss status Leg Press 50#  10 bilateral  x 65# x 35 bilateral  Seated shoulder rows at cable column 10# x 10; 15# x 10 Sit to stand with cable column at 100# to offset patient's weight (standing from transport chair)2 x 4 Lat Pull down seated in transport chair 25# 2 x 10 Seated abduction with yellow loop x 20 6 color cone tap PT verbalizing cone color x 1 mins Seated LAQ 2# AW x 10 bilateral    09/29/2023: NuStep Level 1 5 mins- PT present to discuss status 6:26 min walk around ortho gym with front wheeled walker 6inch step taps finger tip hand hold at barre x 20 unilateral support at barre Standing heel raise x 10 Marching on airex x 20 bilateral support at barre Standing in front of walking chest press with small blue ball 2 x 10 (rest in between) Seated knee flexion with green TB + slider 2 x10 bilateral  Seated shoulder raises  2# DB  x 10 Seated biceps curls 2# DB x15 Seated shoulder extension with red TB x 10    09/22/2023: Standing marching at barre x 10 bilateral  Standing heel raise x 10 5:30 min walk around ortho gym with front wheeled walker Seated abduction with green TB x 20 Seated knee flexion with green TB 2 x 10 bilateral  Narrow BOS on airex + sticky note number tap with PT verbalizing number Seated shoulder abduction with green TB 2 x 10 4inch step taps finger tip hand hold at barre Seated shoulder raises 2# DB 2 x 10 Seated  biceps curls 2# DB 2 x 10 Seated chest press 4# 2 x 10 Seated LAQ 2 x 10     PATIENT EDUCATION:  Education details: PT eval findings, anticipated POC, initial HEP, postural awareness, and transfer safety  Person educated: Patient and brother Education method: Explanation, Demonstration, and Handouts Education comprehension: verbalized understanding and returned demonstration  HOME EXERCISE PROGRAM: Access Code: [email or web address] URL: [email or web address] Date: 09/15/2023 Prepared by: Claude Manges  Exercises - Sit to Stand with Counter Support  - 3 x daily - 3-4 x weekly - 1 sets - 1-5 reps - Hooklying Clamshell with Resistance  - 2 x daily - 3-4 x weekly - 1 sets - 10 reps - Seated Long Arc Quad  - 1 x daily - 7 x weekly - 1-3 sets - 10 reps - 5 sec hold - Standing Hip Abduction with Counter Support  - 1 x daily - 7 x weekly - 1-2 sets - 10 reps - Standing March with Counter Support  - 1 x daily - 7 x weekly - 1-2 sets - 10 reps - Standing Heel Raise with Support  - 1 x daily - 7 x weekly - 1-2 sets - 10 reps - Shoulder External Rotation and Scapular Retraction with Resistance  - 1 x daily - 7 x weekly - 2 sets - 10 reps - Sit to Stand with Armchair  - 1 x daily - 7 x weekly - 1 sets - 5 reps  Patient Education - Tips to reduce freezing episodes with standing or walking   ASSESSMENT:  CLINICAL IMPRESSION: Pt become emotional with "tears of joy" with his ability to walk in the parallel bars. His knees are fully extended and trunk fairly erect.  Able to improve step length with cues.  He initially uses significant UE support but decreased lean on bars with repetition.  He is able to take 5-6 steps without UE support with close CGA for safety.  No increase in back pain level.  Moderate fatigue level end of session with transport chair to exit facility.       OBJECTIVE IMPAIRMENTS: Abnormal gait, decreased balance, decreased ROM, decreased strength, decreased safety  awareness, dizziness, impaired flexibility, impaired sensation, postural dysfunction, and pain.   ACTIVITY LIMITATIONS: carrying, lifting, bending, standing, squatting, stairs, transfers, bed mobility, continence, bathing, and locomotion level  PARTICIPATION LIMITATIONS: shopping and community activity  PERSONAL FACTORS: Age, Fitness, Time since onset of injury/illness/exacerbation, and 3+ comorbidities: Compression fx L1 2022, CAD, CKD (stage 4), neuropathy, retinopathy  are also affecting patient's functional outcome.   REHAB POTENTIAL: Good  CLINICAL DECISION MAKING: Unstable/unpredictable  EVALUATION COMPLEXITY: High   GOALS: Goals reviewed with patient? Yes  SHORT TERM GOALS: Target date: 07/30/23 Improved TUG to < 25 sec to decrease fall risk.  Baseline: Goal status: IN PROGRESS 09/15/2023  2.  Able to perform 5XSTS without UE assist and controlled  descent showing improved LE strength.  Baseline:  Goal status: IN PROGRESS 09/15/2023  3.  Patient will be able to stand safely for 2 min without UE assist Baseline:  Goal status:IN PROGRESS 09/15/2023  LONG TERM GOALS: Target date: 11/10/2023  Ind with advanced HEP Baseline:  Goal status: IN PROGRESS 09/15/2023  2. Patient will be able to ambulate 150' with LRAD with good safety to access community.  Baseline:  Goal status: IN PROGRESS 09/15/2023  3.  Patient able to perform bed mobility with SBA Baseline:  Goal status: IN PROGRESS 09/15/2023  4.  Improved 30 sec chair test to 5 reps  Baseline:  Goal status: IN PROGRESS 09/15/2023  5.  Patient able to get in and out of his recliner at home with good control and no LOB Baseline:  Goal status: IN PROGRESS 09/15/2023    PLAN:  PT FREQUENCY:  2-3x week  PT DURATION: 8 weeks  PLANNED INTERVENTIONS: 97164- PT Re-evaluation, 97110-Therapeutic exercises, 97530- Therapeutic activity, 97112- Neuromuscular re-education, 97535- Self Care, [id number]- Manual therapy, [phone]- Gait training,  [phone]- Aquatic Therapy, 97014- Electrical stimulation (unattended), Patient/Family education, Balance training, Stair training, Taping, Joint mobilization, Spinal mobilization, DME instructions, Cryotherapy, and Moist heat  PLAN FOR NEXT SESSION: gait in // bars working on standing erect, longer step lengths;  assess energy levels and endurance ; functional strengthening; no leg press  Lavinia Sharps, PT 10/20/23 5:29 PM Phone: [phone] Fax: [phone]

## 2023-10-22 ENCOUNTER — Ambulatory Visit: Payer: PPO | Admitting: Physical Therapy

## 2023-10-22 DIAGNOSIS — R262 Difficulty in walking, not elsewhere classified: Secondary | ICD-10-CM

## 2023-10-22 DIAGNOSIS — M6281 Muscle weakness (generalized): Secondary | ICD-10-CM

## 2023-10-22 DIAGNOSIS — R2689 Other abnormalities of gait and mobility: Secondary | ICD-10-CM

## 2023-10-22 NOTE — Therapy (Signed)
 OUTPATIENT PHYSICAL THERAPY LOWER EXTREMITY TREATMENT  Patient Name: Jeffrey [name] MRN: [id number] DOB:Aug 16, 1955, 68 y.o., male Today's Date: 10/22/2023    END OF SESSION:  PT End of Session - 10/22/23 1300     Visit Number 18    Date for PT Re-Evaluation 11/10/23    Authorization Type HTA    Progress Note Due on Visit 20    PT Start Time 1230    PT Stop Time 1320    PT Time Calculation (min) 50 min    Equipment Utilized During Treatment Gait belt    Activity Tolerance Patient tolerated treatment well                      Past Medical History:  Diagnosis Date   Cardiac arrest ([name]) 05/22/2016   Cataract    CHF (congestive heart failure) ([name])    CKD (chronic kidney disease) stage 3, GFR 30-59 ml/min ([name]) 05/28/2021   CKD (chronic kidney disease) stage 4, GFR 15-29 ml/min ([name]) 05/28/2021   Diabetes mellitus without complication ([name])    type 2   Diabetic retinopathy ([name])    Hyperlipidemia    Hypertension    Memory loss    mild   Myocardial infarct ([name]) 2105   Retinopathy    Both eyes   S/P primary angioplasty with coronary stent 05/22/2016   Vitamin B12 deficiency    Vitreous hemorrhage of left eye ([name])    proliferative diabetic retinopathy   Past Surgical History:  Procedure Laterality Date   ANGIOPLASTY     2015   COLONOSCOPY     multiple   EYE SURGERY     PARS PLANA VITRECTOMY Left 03/15/2020   Procedure: PARS PLANA VITRECTOMY WITH 25 GAUGE;  Surgeon: Stephannie Li, MD;  Location: [facility] OR;  Service: Ophthalmology;  Laterality: Left;   PHOTOCOAGULATION WITH LASER Left 03/15/2020   Procedure: PHOTOCOAGULATION WITH LASER; INTRAVITREAL INJECTION OF AVASTIN;  Surgeon: Stephannie Li, MD;  Location: [facility] OR;  Service: Ophthalmology;  Laterality: Left;   REFRACTIVE SURGERY     UPPER GI ENDOSCOPY     several   VITRECTOMY     Patient Active Problem List   Diagnosis Date Noted   Rhinitis 05/20/2023   Cervical spondylosis 08/26/2022   CKD (chronic  kidney disease) stage 4, GFR 15-29 ml/min ([name]) 05/28/2021   Adjustment disorder 01/22/2021   Chronic back pain 10/22/2020   Peripheral vertigo 05/15/2020   Dermatitis 01/04/2020   Unintentional weight loss with loose stools 05/19/2019   Low vitamin B12 level 03/29/2019   Heme positive stool 03/14/2019   Normocytic anemia 03/14/2019   Chronic diastolic heart failure ([name]) 02/08/2019   Diabetic retinopathy ([name]) 09/08/2018   Physical debility 05/24/2018   Varicose veins of both lower extremities 03/01/2018   Fatigue due to exertion 10/14/2017   GERD (gastroesophageal reflux disease) 08/25/2017   CAD in native artery 04/14/2017   Hyperlipidemia associated with type 2 diabetes mellitus ([name]) 04/14/2017   Diabetic peripheral neuropathy associated with type 2 diabetes mellitus ([name]) 08/17/2016   [name] 05/02/2016   Hypertension associated with diabetes ([name]) 05/02/2016    PCP: Ardith Dark, MD   REFERRING PROVIDER: Ardith Dark, MD   REFERRING DIAG: R53.81 (ICD-10-CM) - Physical debility   THERAPY DIAG:  Muscle weakness (generalized)  Other abnormalities of gait and mobility  Difficulty in walking, not elsewhere classified  Rationale for Evaluation and Treatment: Rehabilitation  ONSET DATE: 2022  SUBJECTIVE:  SUBJECTIVE STATEMENT: Arrives by transport chair. Reports he was tired after last visit but using the transport chair it was manageable to get home after PT.  From eval: Patient has been to two different therapy places and not had good experiences. One at [facility], one private practice. I want to be able to walk without the walker and I'm having trouble getting out of chairs without the walker. Presently sits in a dining chair. Can't sit in his recliner in his bedroom anymore. Wants to work on balance also. Joined [employer] which has a track and an elevator (on pause right now due to disability). Periodically legs shake when he is  walking.   Brother: [name] identical twins  PERTINENT HISTORY: 3/11: evidence of lumbar compression deformity Compression fx L1 2022, CAD, CKD (stage 4), DM, cardiac arrest 2017, neuropathy, retinopathy Long history of gait abnormality beginning back in 2022. He has a history of CAD and believes that his debility began following a covid infection. In December odf 2022 he fell and suffered a compression fracture of L1.  PAIN:  Are you having pain? No  PRECAUTIONS: Fall  RED FLAGS: None   WEIGHT BEARING RESTRICTIONS: No  FALLS:  Has patient fallen in last 6 months? No and with sit to stand falls back sometimes  LIVING ENVIRONMENT: Lives with: lives with their family Lives in: House/apartment Stairs: No Has following equipment at home: Single point cane, Environmental consultant - 2 wheeled, Environmental consultant - 4 wheeled, shower chair, Grab bars, and stand up walker also; also has shower chair at sink for shaving, brushing teeth etc, also has transporter.  OCCUPATION: retired  PLOF: Independent with household mobility with device  PATIENT GOALS: to walk without a walker  NEXT MD VISIT: 09/28/22  OBJECTIVE:  Note: Objective measures were completed at Evaluation unless otherwise noted.  DIAGNOSTIC FINDINGS: N/A  COGNITION: Overall cognitive status: Within functional limits for tasks assessed     SENSATION: Neuropathy in B feet   MUSCLE LENGTH: Marked HS R>L, hip flexor tightness   POSTURE: rounded shoulders and forward head  LOWER EXTREMITY ROM: WFL for tasks assessed  LOWER EXTREMITY MMT: *tested in hooklying  MMT Right eval Left eval  Hip flexion 4 4  Hip extension Able to bridge Able to bridge  Hip abduction 4-* 4-*  Hip adduction    Hip internal rotation    Hip external rotation    Knee flexion    Knee extension 4 4+  Ankle dorsiflexion 4+ 4-  Ankle plantarflexion    Ankle inversion    Ankle eversion     (Blank rows = not tested)   FUNCTIONAL TESTS:  30 seconds chair stand  test Timed up and go (TUG): 59.36 with 4WW Stance time: 45 sec patient reports unsteadiness and feels he goes backwards 07/27/2023 92 ft decreased cadence; decreased step length; absent heel strike  12/26: 5x STS: unable to rise without heavy UE use; attempted to use armrests for test but does not come all the way up;  next trial with chair +cushion to touch at least one RW handle 48.2 sec:   TUG: cushion in seat +arm use and RW:  52.49 sec  08/13/23 attempted 5x STS but pt unable to complete: 1st rep does not come all the way up and then plops heavily into the transport wheelchair (he then recounts how this happened at home and how the chair tipped backwards) unable to complete reps Unable to complete TUG today, he does not have his 4  wheeled walker today and feels he is slower with the clinic RW  09/15/2023 TUG: 1:03 with rollator 5STS:48.55 sec heavy reliance on UE support; last repetition was a challenge  GAIT: Distance walked: 25 Assistive device utilized: Walker - 4 wheeled Level of assistance: Modified independence Comments: slow speed, decreased step and stride, decreased heel strike, forward trunk lean.  TRANSFERS: Sit to stand: Uses walker to pull up; stand to sit: does not fully back up to chair and does not reach back; Supine to sit: needs min A for log roll  (has some back pain with sit <>supine due to sitting upward from supine) also fear of falling from bed, sit to supine needs min A to lift legs on to mat table  TODAY'S TREATMENT:                                                                                                                              DATE:   10/22/23: Seated Freeform cable rows 7# on each side; x 10 80# cable assisted sit to stand 5x (heavy use of Ues 70%, 30% LE use) Standing in // bars: weight shifting side to side, staggered stance front/back 8x each // bars: 6 laps with CGA with gait belt, 4 steps without UE support 3 trials Side stepping in //  bars 2 laps Stepping over dowels on ground in // bars(very challenging) Backwards wall in // bars 1 lap RPE rated 2-3 however pt demonstrates signs of fatigue Car transfer: transport chair into car with set up assist only  10/20/23: Seated cable rows 10# 2x 10 Seated red power cord ankle plantarflexion (held by PT) 10x right/left 100# cable assisted sit to stand 2 sets of 5 (heavy use of Ues 70%, 30% LE use) RPE 6/10 // bars: 6 laps with CGA with gait belt verbal cues for increased step length and full trunk extension RPE "less b/c that was motivating and made me feel good"  10/13/2023: NuStep Level 2 5 mins- PT present to discuss status Leg Press 50#  2 x 10 bilateral   Seated chest press 4# DB 2 x 10 Seated knee flexion with green TB 2 x 10 bilateral  Standing hip marching x 10 each at barre Standing heel raises x 10 Seated LAQ 2# AW x 10 bilateral  Seated biceps curls 2# DB 2 x 10 Ended session early due to patient's fatigue    10/06/2023: NuStep Level 1 5 mins- PT present to discuss status Leg Press 50#  10 bilateral  x 65# x 35 bilateral  Seated shoulder rows at cable column 10# x 10; 15# x 10 Sit to stand with cable column at 100# to offset patient's weight (standing from transport chair)2 x 4 Lat Pull down seated in transport chair 25# 2 x 10 Seated abduction with yellow loop x 20 6 color cone tap PT verbalizing cone color x 1 mins Seated LAQ 2# AW x 10 bilateral  09/29/2023: NuStep Level 1 5 mins- PT present to discuss status 6:26 min walk around ortho gym with front wheeled walker 6inch step taps finger tip hand hold at barre x 20 unilateral support at barre Standing heel raise x 10 Marching on airex x 20 bilateral support at barre Standing in front of walking chest press with small blue ball 2 x 10 (rest in between) Seated knee flexion with green TB + slider 2 x10 bilateral  Seated shoulder raises 2# DB  x 10 Seated biceps curls 2# DB x15 Seated shoulder  extension with red TB x 10     PATIENT EDUCATION:  Education details: PT eval findings, anticipated POC, initial HEP, postural awareness, and transfer safety  Person educated: Patient and brother Education method: Explanation, Demonstration, and Handouts Education comprehension: verbalized understanding and returned demonstration  HOME EXERCISE PROGRAM: Access Code: [email or web address] URL: [email or web address] Date: 09/15/2023 Prepared by: Claude Manges  Exercises - Sit to Stand with Counter Support  - 3 x daily - 3-4 x weekly - 1 sets - 1-5 reps - Hooklying Clamshell with Resistance  - 2 x daily - 3-4 x weekly - 1 sets - 10 reps - Seated Long Arc Quad  - 1 x daily - 7 x weekly - 1-3 sets - 10 reps - 5 sec hold - Standing Hip Abduction with Counter Support  - 1 x daily - 7 x weekly - 1-2 sets - 10 reps - Standing March with Counter Support  - 1 x daily - 7 x weekly - 1-2 sets - 10 reps - Standing Heel Raise with Support  - 1 x daily - 7 x weekly - 1-2 sets - 10 reps - Shoulder External Rotation and Scapular Retraction with Resistance  - 1 x daily - 7 x weekly - 2 sets - 10 reps - Sit to Stand with Armchair  - 1 x daily - 7 x weekly - 1 sets - 5 reps  Patient Education - Tips to reduce freezing episodes with standing or walking   ASSESSMENT:  CLINICAL IMPRESSION: Pt responds well to gait exercises within the parallel bars with pt demonstrating erect posture and less reliance of Ues on the bars.  He rates his perceived exertion level low throughout session although he shows physical signs of general fatigue.  Encouraged short sitting breaks to recover between bouts of standing ex's.  He also reports mental fatigue from prolonged focus on tasks.  He demonstrates a safe car transfer with set up assistance only to position transport chair near the passenger side of the car.      OBJECTIVE IMPAIRMENTS: Abnormal gait, decreased balance, decreased ROM, decreased strength, decreased  safety awareness, dizziness, impaired flexibility, impaired sensation, postural dysfunction, and pain.   ACTIVITY LIMITATIONS: carrying, lifting, bending, standing, squatting, stairs, transfers, bed mobility, continence, bathing, and locomotion level  PARTICIPATION LIMITATIONS: shopping and community activity  PERSONAL FACTORS: Age, Fitness, Time since onset of injury/illness/exacerbation, and 3+ comorbidities: Compression fx L1 2022, CAD, CKD (stage 4), neuropathy, retinopathy  are also affecting patient's functional outcome.   REHAB POTENTIAL: Good  CLINICAL DECISION MAKING: Unstable/unpredictable  EVALUATION COMPLEXITY: High   GOALS: Goals reviewed with patient? Yes  SHORT TERM GOALS: Target date: 07/30/23 Improved TUG to < 25 sec to decrease fall risk.  Baseline: Goal status: IN PROGRESS 09/15/2023  2.  Able to perform 5XSTS without UE assist and controlled descent showing improved LE strength.  Baseline:  Goal status: IN PROGRESS 09/15/2023  3.  Patient will be  able to stand safely for 2 min without UE assist Baseline:  Goal status:IN PROGRESS 09/15/2023  LONG TERM GOALS: Target date: 11/10/2023  Ind with advanced HEP Baseline:  Goal status: IN PROGRESS 09/15/2023  2. Patient will be able to ambulate 150' with LRAD with good safety to access community.  Baseline:  Goal status: IN PROGRESS 09/15/2023  3.  Patient able to perform bed mobility with SBA Baseline:  Goal status: IN PROGRESS 09/15/2023  4.  Improved 30 sec chair test to 5 reps  Baseline:  Goal status: IN PROGRESS 09/15/2023  5.  Patient able to get in and out of his recliner at home with good control and no LOB Baseline:  Goal status: IN PROGRESS 09/15/2023    PLAN:  PT FREQUENCY:  2-3x week  PT DURATION: 8 weeks  PLANNED INTERVENTIONS: 97164- PT Re-evaluation, 97110-Therapeutic exercises, 97530- Therapeutic activity, 97112- Neuromuscular re-education, 97535- Self Care, 16109- Manual therapy, [phone]- Gait  training, [phone]- Aquatic Therapy, 97014- Electrical stimulation (unattended), Patient/Family education, Balance training, Stair training, Taping, Joint mobilization, Spinal mobilization, DME instructions, Cryotherapy, and Moist heat  PLAN FOR NEXT SESSION: gait in // bars working on standing erect, longer step lengths;  assess energy levels and endurance ; functional strengthening; no leg press  Lavinia Sharps, PT 10/22/23 1:59 PM Phone: [phone] Fax: [phone]

## 2023-10-27 ENCOUNTER — Encounter: Payer: PPO | Admitting: Physical Therapy

## 2023-10-27 DIAGNOSIS — H31093 Other chorioretinal scars, bilateral: Secondary | ICD-10-CM | POA: Diagnosis not present

## 2023-10-27 DIAGNOSIS — H3582 Retinal ischemia: Secondary | ICD-10-CM | POA: Diagnosis not present

## 2023-10-27 DIAGNOSIS — E113593 Type 2 diabetes mellitus with proliferative diabetic retinopathy without macular edema, bilateral: Secondary | ICD-10-CM | POA: Diagnosis not present

## 2023-10-27 DIAGNOSIS — H2513 Age-related nuclear cataract, bilateral: Secondary | ICD-10-CM | POA: Diagnosis not present

## 2023-10-28 ENCOUNTER — Ambulatory Visit: Payer: PPO | Admitting: Physical Therapy

## 2023-10-28 ENCOUNTER — Ambulatory Visit

## 2023-10-28 ENCOUNTER — Other Ambulatory Visit: Payer: Self-pay | Admitting: Family Medicine

## 2023-10-28 ENCOUNTER — Ambulatory Visit: Admitting: *Deleted

## 2023-10-28 ENCOUNTER — Telehealth: Payer: Self-pay

## 2023-10-28 DIAGNOSIS — M6281 Muscle weakness (generalized): Secondary | ICD-10-CM | POA: Diagnosis not present

## 2023-10-28 DIAGNOSIS — R262 Difficulty in walking, not elsewhere classified: Secondary | ICD-10-CM

## 2023-10-28 DIAGNOSIS — R2689 Other abnormalities of gait and mobility: Secondary | ICD-10-CM

## 2023-10-28 NOTE — Therapy (Signed)
 OUTPATIENT PHYSICAL THERAPY LOWER EXTREMITY TREATMENT  Patient Name: Jeffrey [name] MRN: [id number] DOB:02-22-1956, 68 y.o., male Today's Date: 10/28/2023    END OF SESSION:  PT End of Session - 10/28/23 1451     Visit Number 19    Date for PT Re-Evaluation 11/10/23    Authorization Type HTA    Progress Note Due on Visit 20    PT Start Time 1445    PT Stop Time 1529    PT Time Calculation (min) 44 min    Equipment Utilized During Treatment Gait belt    Activity Tolerance Patient tolerated treatment well                      Past Medical History:  Diagnosis Date   Cardiac arrest ([name]) 05/22/2016   Cataract    CHF (congestive heart failure) ([name])    CKD (chronic kidney disease) stage 3, GFR 30-59 ml/min ([name]) 05/28/2021   CKD (chronic kidney disease) stage 4, GFR 15-29 ml/min ([name]) 05/28/2021   Diabetes mellitus without complication ([name])    type 2   Diabetic retinopathy ([name])    Hyperlipidemia    Hypertension    Memory loss    mild   Myocardial infarct ([name]) 2105   Retinopathy    Both eyes   S/P primary angioplasty with coronary stent 05/22/2016   Vitamin B12 deficiency    Vitreous hemorrhage of left eye ([name])    proliferative diabetic retinopathy   Past Surgical History:  Procedure Laterality Date   ANGIOPLASTY     2015   COLONOSCOPY     multiple   EYE SURGERY     PARS PLANA VITRECTOMY Left 03/15/2020   Procedure: PARS PLANA VITRECTOMY WITH 25 GAUGE;  Surgeon: Stephannie Li, MD;  Location: [facility] OR;  Service: Ophthalmology;  Laterality: Left;   PHOTOCOAGULATION WITH LASER Left 03/15/2020   Procedure: PHOTOCOAGULATION WITH LASER; INTRAVITREAL INJECTION OF AVASTIN;  Surgeon: Stephannie Li, MD;  Location: [facility] OR;  Service: Ophthalmology;  Laterality: Left;   REFRACTIVE SURGERY     UPPER GI ENDOSCOPY     several   VITRECTOMY     Patient Active Problem List   Diagnosis Date Noted   Rhinitis 05/20/2023   Cervical spondylosis 08/26/2022   CKD (chronic  kidney disease) stage 4, GFR 15-29 ml/min ([name]) 05/28/2021   Adjustment disorder 01/22/2021   Chronic back pain 10/22/2020   Peripheral vertigo 05/15/2020   Dermatitis 01/04/2020   Unintentional weight loss with loose stools 05/19/2019   Low vitamin B12 level 03/29/2019   Heme positive stool 03/14/2019   Normocytic anemia 03/14/2019   Chronic diastolic heart failure ([name]) 02/08/2019   Diabetic retinopathy ([name]) 09/08/2018   Physical debility 05/24/2018   Varicose veins of both lower extremities 03/01/2018   Fatigue due to exertion 10/14/2017   GERD (gastroesophageal reflux disease) 08/25/2017   CAD in native artery 04/14/2017   Hyperlipidemia associated with type 2 diabetes mellitus ([name]) 04/14/2017   Diabetic peripheral neuropathy associated with type 2 diabetes mellitus ([name]) 08/17/2016   [name] 05/02/2016   Hypertension associated with diabetes ([name]) 05/02/2016    PCP: Ardith Dark, MD   REFERRING PROVIDER: Ardith Dark, MD   REFERRING DIAG: R53.81 (ICD-10-CM) - Physical debility   THERAPY DIAG:  Muscle weakness (generalized)  Other abnormalities of gait and mobility  Difficulty in walking, not elsewhere classified  Rationale for Evaluation and Treatment: Rehabilitation  ONSET DATE: 2022  SUBJECTIVE:  SUBJECTIVE STATEMENT: Had shingles shot on Friday and that has wiped me out.  Difficulty getting out of the recliner.  Felt good after last visit-motivating.  I want to be walking this summer.   From eval: Patient has been to two different therapy places and not had good experiences. One at [facility], one private practice. I want to be able to walk without the walker and I'm having trouble getting out of chairs without the walker. Presently sits in a dining chair. Can't sit in his recliner in his bedroom anymore. Wants to work on balance also. Joined [employer] which has a track and an elevator (on pause right now due to disability).  Periodically legs shake when he is walking.   Brother: Jeffrey [name] identical twins  PERTINENT HISTORY: 3/11: evidence of lumbar compression deformity Compression fx L1 2022, CAD, CKD (stage 4), DM, cardiac arrest 2017, neuropathy, retinopathy Long history of gait abnormality beginning back in 2022. He has a history of CAD and believes that his debility began following a covid infection. In December odf 2022 he fell and suffered a compression fracture of L1.  PAIN:  Are you having pain? No  PRECAUTIONS: Fall  RED FLAGS: None   WEIGHT BEARING RESTRICTIONS: No  FALLS:  Has patient fallen in last 6 months? No and with sit to stand falls back sometimes  LIVING ENVIRONMENT: Lives with: lives with their family Lives in: House/apartment Stairs: No Has following equipment at home: Single point cane, Environmental consultant - 2 wheeled, Environmental consultant - 4 wheeled, shower chair, Grab bars, and stand up walker also; also has shower chair at sink for shaving, brushing teeth etc, also has transporter.  OCCUPATION: retired  PLOF: Independent with household mobility with device  PATIENT GOALS: to walk without a walker  NEXT MD VISIT: 09/28/22  OBJECTIVE:  Note: Objective measures were completed at Evaluation unless otherwise noted.  DIAGNOSTIC FINDINGS: N/A  COGNITION: Overall cognitive status: Within functional limits for tasks assessed     SENSATION: Neuropathy in B feet   MUSCLE LENGTH: Marked HS R>L, hip flexor tightness   POSTURE: rounded shoulders and forward head  LOWER EXTREMITY ROM: WFL for tasks assessed  LOWER EXTREMITY MMT: *tested in hooklying  MMT Right eval Left eval  Hip flexion 4 4  Hip extension Able to bridge Able to bridge  Hip abduction 4-* 4-*  Hip adduction    Hip internal rotation    Hip external rotation    Knee flexion    Knee extension 4 4+  Ankle dorsiflexion 4+ 4-  Ankle plantarflexion    Ankle inversion    Ankle eversion     (Blank rows = not  tested)   FUNCTIONAL TESTS:  30 seconds chair stand test Timed up and go (TUG): 59.36 with 4WW Stance time: 45 sec patient reports unsteadiness and feels he goes backwards 07/27/2023 92 ft decreased cadence; decreased step length; absent heel strike  12/26: 5x STS: unable to rise without heavy UE use; attempted to use armrests for test but does not come all the way up;  next trial with chair +cushion to touch at least one RW handle 48.2 sec:   TUG: cushion in seat +arm use and RW:  52.49 sec  08/13/23 attempted 5x STS but pt unable to complete: 1st rep does not come all the way up and then plops heavily into the transport wheelchair (he then recounts how this happened at home and how the chair tipped backwards) unable to complete reps Unable to  complete TUG today, he does not have his 4 wheeled walker today and feels he is slower with the clinic RW  09/15/2023 TUG: 1:03 with rollator 5STS:48.55 sec heavy reliance on UE support; last repetition was a challenge  GAIT: Distance walked: 25 Assistive device utilized: Walker - 4 wheeled Level of assistance: Modified independence Comments: slow speed, decreased step and stride, decreased heel strike, forward trunk lean.  TRANSFERS: Sit to stand: Uses walker to pull up; stand to sit: does not fully back up to chair and does not reach back; Supine to sit: needs min A for log roll  (has some back pain with sit <>supine due to sitting upward from supine) also fear of falling from bed, sit to supine needs min A to lift legs on to mat table  TODAY'S TREATMENT:                                                                                                                              DATE:   10/28/23: Standing in // bars: weight shifting side to side, staggered stance front/back 8x each // bars: 6 laps with CGA with gait belt, 4 steps without UE support 5 trials Side stepping in // bars 2 laps Backwards wall in // bars 1 lap 2 inch forward  step ups 8x right/left with heavy UE assist on // bars Mini squats in // bars 2 sets of 5 (2nd set with slightly less use of UE) In // bars multi directional random color circle taps forward, back and side (bil UE assist needed) 2 min rest breaks 3x  RPE 5/10 end of session  10/22/23: Seated Freeform cable rows 7# on each side; x 10 80# cable assisted sit to stand 5x (heavy use of Ues 70%, 30% LE use) Standing in // bars: weight shifting side to side, staggered stance front/back 8x each // bars: 6 laps with CGA with gait belt, 4 steps without UE support 3 trials Side stepping in // bars 2 laps Stepping over dowels on ground in // bars(very challenging) Backwards wall in // bars 1 lap RPE rated 2-3 however pt demonstrates signs of fatigue Car transfer: transport chair into car with set up assist only  10/20/23: Seated cable rows 10# 2x 10 Seated red power cord ankle plantarflexion (held by PT) 10x right/left 100# cable assisted sit to stand 2 sets of 5 (heavy use of Ues 70%, 30% LE use) RPE 6/10 // bars: 6 laps with CGA with gait belt verbal cues for increased step length and full trunk extension RPE "less b/c that was motivating and made me feel good"  10/13/2023: NuStep Level 2 5 mins- PT present to discuss status Leg Press 50#  2 x 10 bilateral   Seated chest press 4# DB 2 x 10 Seated knee flexion with green TB 2 x 10 bilateral  Standing hip marching x 10 each at barre Standing heel raises x 10 Seated LAQ 2# AW x  10 bilateral  Seated biceps curls 2# DB 2 x 10 Ended session early due to patient's fatigue    10/06/2023: NuStep Level 1 5 mins- PT present to discuss status Leg Press 50#  10 bilateral  x 65# x 35 bilateral  Seated shoulder rows at cable column 10# x 10; 15# x 10 Sit to stand with cable column at 100# to offset patient's weight (standing from transport chair)2 x 4 Lat Pull down seated in transport chair 25# 2 x 10 Seated abduction with yellow loop x 20 6 color  cone tap PT verbalizing cone color x 1 mins Seated LAQ 2# AW x 10 bilateral      PATIENT EDUCATION:  Education details: PT eval findings, anticipated POC, initial HEP, postural awareness, and transfer safety  Person educated: Patient and brother Education method: Explanation, Demonstration, and Handouts Education comprehension: verbalized understanding and returned demonstration  HOME EXERCISE PROGRAM: Access Code: [email or web address] URL: [email or web address] Date: 09/15/2023 Prepared by: Claude Manges  Exercises - Sit to Stand with Counter Support  - 3 x daily - 3-4 x weekly - 1 sets - 1-5 reps - Hooklying Clamshell with Resistance  - 2 x daily - 3-4 x weekly - 1 sets - 10 reps - Seated Long Arc Quad  - 1 x daily - 7 x weekly - 1-3 sets - 10 reps - 5 sec hold - Standing Hip Abduction with Counter Support  - 1 x daily - 7 x weekly - 1-2 sets - 10 reps - Standing March with Counter Support  - 1 x daily - 7 x weekly - 1-2 sets - 10 reps - Standing Heel Raise with Support  - 1 x daily - 7 x weekly - 1-2 sets - 10 reps - Shoulder External Rotation and Scapular Retraction with Resistance  - 1 x daily - 7 x weekly - 2 sets - 10 reps - Sit to Stand with Armchair  - 1 x daily - 7 x weekly - 1 sets - 5 reps  Patient Education - Tips to reduce freezing episodes with standing or walking   ASSESSMENT:  CLINICAL IMPRESSION: Galan is motivated by ex's in the parallel bars. Increased standing tolerance today to > 40 minutes with 2 minute seated rest breaks 3x needed.  Improved gait speed, toe clearance with verbal cues for forward gaze.  CGA with gait belt for safety with walking attempts without holding parallel bars.  Heavy reliance on Ues with sit to stand. Moderate fatigue level RPE 5/10 end of session.     OBJECTIVE IMPAIRMENTS: Abnormal gait, decreased balance, decreased ROM, decreased strength, decreased safety awareness, dizziness, impaired flexibility, impaired sensation,  postural dysfunction, and pain.   ACTIVITY LIMITATIONS: carrying, lifting, bending, standing, squatting, stairs, transfers, bed mobility, continence, bathing, and locomotion level  PARTICIPATION LIMITATIONS: shopping and community activity  PERSONAL FACTORS: Age, Fitness, Time since onset of injury/illness/exacerbation, and 3+ comorbidities: Compression fx L1 2022, CAD, CKD (stage 4), neuropathy, retinopathy  are also affecting patient's functional outcome.   REHAB POTENTIAL: Good  CLINICAL DECISION MAKING: Unstable/unpredictable  EVALUATION COMPLEXITY: High   GOALS: Goals reviewed with patient? Yes  SHORT TERM GOALS: Target date: 07/30/23 Improved TUG to < 25 sec to decrease fall risk.  Baseline: Goal status: IN PROGRESS 09/15/2023  2.  Able to perform 5XSTS without UE assist and controlled descent showing improved LE strength.  Baseline:  Goal status: IN PROGRESS 09/15/2023  3.  Patient will be able to stand safely for 2 min without UE  assist Baseline:  Goal status:IN PROGRESS 09/15/2023  LONG TERM GOALS: Target date: 11/10/2023  Ind with advanced HEP Baseline:  Goal status: IN PROGRESS 09/15/2023  2. Patient will be able to ambulate 150' with LRAD with good safety to access community.  Baseline:  Goal status: IN PROGRESS 09/15/2023  3.  Patient able to perform bed mobility with SBA Baseline:  Goal status: IN PROGRESS 09/15/2023  4.  Improved 30 sec chair test to 5 reps  Baseline:  Goal status: IN PROGRESS 09/15/2023  5.  Patient able to get in and out of his recliner at home with good control and no LOB Baseline:  Goal status: IN PROGRESS 09/15/2023    PLAN:  PT FREQUENCY:  2-3x week  PT DURATION: 8 weeks  PLANNED INTERVENTIONS: 97164- PT Re-evaluation, 97110-Therapeutic exercises, 97530- Therapeutic activity, 97112- Neuromuscular re-education, 97535- Self Care, 25366- Manual therapy, [phone]- Gait training, [phone]- Aquatic Therapy, 97014- Electrical stimulation  (unattended), Patient/Family education, Balance training, Stair training, Taping, Joint mobilization, Spinal mobilization, DME instructions, Cryotherapy, and Moist heat  PLAN FOR NEXT SESSION: gait in // bars working on standing erect, longer step lengths;  assess energy levels and endurance ; functional strengthening; no leg press  Lavinia Sharps, PT 10/28/23 4:40 PM Phone: [phone] Fax: [phone]

## 2023-10-28 NOTE — Progress Notes (Signed)
 Teach patient to placed freestyle libre 3 sensor and set receiver Sensor placed in left arm, patient tolerated well  First reading will be in 60 min Verbalized understanding

## 2023-10-28 NOTE — Telephone Encounter (Signed)
 Called and spoke with patient. Informed them that there had been a nurse visit scheduled to our office and I was getting clarification to why, as there were no appointment notes. They had informed me this must have been in error as patient is established at [facility].

## 2023-10-29 ENCOUNTER — Ambulatory Visit: Payer: PPO | Admitting: Physical Therapy

## 2023-10-29 DIAGNOSIS — M6281 Muscle weakness (generalized): Secondary | ICD-10-CM | POA: Diagnosis not present

## 2023-10-29 DIAGNOSIS — R2689 Other abnormalities of gait and mobility: Secondary | ICD-10-CM

## 2023-10-29 DIAGNOSIS — R262 Difficulty in walking, not elsewhere classified: Secondary | ICD-10-CM

## 2023-10-29 NOTE — Therapy (Signed)
 OUTPATIENT PHYSICAL THERAPY LOWER EXTREMITY TREATMENT  Patient Name: Jeffrey [name] MRN: [id number] DOB:04/05/1956, 68 y.o., male Today's Date: 10/29/2023 Progress Note Reporting Period 08/13/23 to 10/29/23  See note below for Objective Data and Assessment of Progress/Goals.       END OF SESSION:  PT End of Session - 10/29/23 1357     Visit Number 20    Date for PT Re-Evaluation 11/10/23    Authorization Type HTA    Progress Note Due on Visit 30    PT Start Time 1400    PT Stop Time 1443    PT Time Calculation (min) 43 min    Equipment Utilized During Treatment Gait belt    Activity Tolerance Patient tolerated treatment well                      Past Medical History:  Diagnosis Date   Cardiac arrest ([name]) 05/22/2016   Cataract    CHF (congestive heart failure) ([name])    CKD (chronic kidney disease) stage 3, GFR 30-59 ml/min ([name]) 05/28/2021   CKD (chronic kidney disease) stage 4, GFR 15-29 ml/min ([name]) 05/28/2021   Diabetes mellitus without complication ([name])    type 2   Diabetic retinopathy ([name])    Hyperlipidemia    Hypertension    Memory loss    mild   Myocardial infarct ([name]) 2105   Retinopathy    Both eyes   S/P primary angioplasty with coronary stent 05/22/2016   Vitamin B12 deficiency    Vitreous hemorrhage of left eye ([name])    proliferative diabetic retinopathy   Past Surgical History:  Procedure Laterality Date   ANGIOPLASTY     2015   COLONOSCOPY     multiple   EYE SURGERY     PARS PLANA VITRECTOMY Left 03/15/2020   Procedure: PARS PLANA VITRECTOMY WITH 25 GAUGE;  Surgeon: Stephannie Li, MD;  Location: [facility] OR;  Service: Ophthalmology;  Laterality: Left;   PHOTOCOAGULATION WITH LASER Left 03/15/2020   Procedure: PHOTOCOAGULATION WITH LASER; INTRAVITREAL INJECTION OF AVASTIN;  Surgeon: Stephannie Li, MD;  Location: [facility] OR;  Service: Ophthalmology;  Laterality: Left;   REFRACTIVE SURGERY     UPPER GI ENDOSCOPY     several   VITRECTOMY      Patient Active Problem List   Diagnosis Date Noted   Rhinitis 05/20/2023   Cervical spondylosis 08/26/2022   CKD (chronic kidney disease) stage 4, GFR 15-29 ml/min ([name]) 05/28/2021   Adjustment disorder 01/22/2021   Chronic back pain 10/22/2020   Peripheral vertigo 05/15/2020   Dermatitis 01/04/2020   Unintentional weight loss with loose stools 05/19/2019   Low vitamin B12 level 03/29/2019   Heme positive stool 03/14/2019   Normocytic anemia 03/14/2019   Chronic diastolic heart failure ([name]) 02/08/2019   Diabetic retinopathy ([name]) 09/08/2018   Physical debility 05/24/2018   Varicose veins of both lower extremities 03/01/2018   Fatigue due to exertion 10/14/2017   GERD (gastroesophageal reflux disease) 08/25/2017   CAD in native artery 04/14/2017   Hyperlipidemia associated with type 2 diabetes mellitus ([name]) 04/14/2017   Diabetic peripheral neuropathy associated with type 2 diabetes mellitus ([name]) 08/17/2016   [name] 05/02/2016   Hypertension associated with diabetes ([name]) 05/02/2016    PCP: Ardith Dark, MD   REFERRING PROVIDER: Ardith Dark, MD   REFERRING DIAG: R53.81 (ICD-10-CM) - Physical debility   THERAPY DIAG:  Muscle weakness (generalized)  Other abnormalities of gait and mobility  Difficulty in walking, not elsewhere classified  Rationale for Evaluation and Treatment: Rehabilitation  ONSET DATE: 2022  SUBJECTIVE:   SUBJECTIVE STATEMENT: I was able to get out of the bigger recliner better.  Pt states," I'm seeing a lot of change coming here."  Began using an arm blood sugar monitor yesterday.  Brother [name] states they plan to do a little walking with the RW this weekend in the hallway. Arrives and departs via WC in order to conserve energy for session and to avoid over fatigue for returning home after visit.   From eval: Patient has been to two different therapy places and not had good experiences. One at [facility], one  private practice. I want to be able to walk without the walker and I'm having trouble getting out of chairs without the walker. Presently sits in a dining chair. Can't sit in his recliner in his bedroom anymore. Wants to work on balance also. Joined [employer] which has a track and an elevator (on pause right now due to disability). Periodically legs shake when he is walking.   Brother: [name] identical twins  PERTINENT HISTORY: 3/11: evidence of lumbar compression deformity Compression fx L1 2022, CAD, CKD (stage 4), DM, cardiac arrest 2017, neuropathy, retinopathy Long history of gait abnormality beginning back in 2022. He has a history of CAD and believes that his debility began following a covid infection. In December odf 2022 he fell and suffered a compression fracture of L1.  PAIN:  Are you having pain? No  PRECAUTIONS: Fall  RED FLAGS: None   WEIGHT BEARING RESTRICTIONS: No  FALLS:  Has patient fallen in last 6 months? No and with sit to stand falls back sometimes  LIVING ENVIRONMENT: Lives with: lives with their family Lives in: House/apartment Stairs: No Has following equipment at home: Single point cane, Environmental consultant - 2 wheeled, Environmental consultant - 4 wheeled, shower chair, Grab bars, and stand up walker also; also has shower chair at sink for shaving, brushing teeth etc, also has transporter.  OCCUPATION: retired  PLOF: Independent with household mobility with device  PATIENT GOALS: to walk without a walker  NEXT MD VISIT: 09/28/22  OBJECTIVE:  Note: Objective measures were completed at Evaluation unless otherwise noted.  DIAGNOSTIC FINDINGS: N/A  COGNITION: Overall cognitive status: Within functional limits for tasks assessed     SENSATION: Neuropathy in B feet   MUSCLE LENGTH: Marked HS R>L, hip flexor tightness   POSTURE: rounded shoulders and forward head  LOWER EXTREMITY ROM: WFL for tasks assessed  LOWER EXTREMITY MMT: *tested in hooklying  MMT Right eval  Left eval  Hip flexion 4 4  Hip extension Able to bridge Able to bridge  Hip abduction 4-* 4-*  Hip adduction    Hip internal rotation    Hip external rotation    Knee flexion    Knee extension 4 4+  Ankle dorsiflexion 4+ 4-  Ankle plantarflexion    Ankle inversion    Ankle eversion     (Blank rows = not tested)  3/20:  max UE assist to rise sit to stand; LE hip extension, knee extension grossly 3+ to 4-/5  FUNCTIONAL TESTS:  30 seconds chair stand test Timed up and go (TUG): 59.36 with 4WW Stance time: 45 sec patient reports unsteadiness and feels he goes backwards 07/27/2023 92 ft decreased cadence; decreased step length; absent heel strike  12/26: 5x STS: unable to rise without heavy UE use; attempted to use armrests for test but does not  come all the way up;  next trial with chair +cushion to touch at least one RW handle 48.2 sec:   TUG: cushion in seat +arm use and RW:  52.49 sec  08/13/23 attempted 5x STS but pt unable to complete: 1st rep does not come all the way up and then plops heavily into the transport wheelchair (he then recounts how this happened at home and how the chair tipped backwards) unable to complete reps Unable to complete TUG today, he does not have his 4 wheeled walker today and feels he is slower with the clinic RW  09/15/2023 TUG: 1:03 with rollator 5STS:48.55 sec heavy reliance on UE support; last repetition was a challenge  3/20: Heavy UE reliance with STS (unable to push up from [facility] armrests, pulls up from // bars)  GAIT: Distance walked: 25 Assistive device utilized: Walker - 4 wheeled Level of assistance: Modified independence Comments: slow speed, decreased step and stride, decreased heel strike, forward trunk lean.  TRANSFERS: Sit to stand: Uses walker to pull up; stand to sit: does not fully back up to chair and does not reach back; Supine to sit: needs min A for log roll  (has some back pain with sit <>supine due to sitting upward from  supine) also fear of falling from bed, sit to supine needs min A to lift legs on to mat table  TODAY'S TREATMENT:                                                                                                                              DATE:   10/28/23: Standing in // bars: alternating lateral toe taps Standing with alternating UE reaches 10x // bars: 6 laps with CGA with gait belt, 8 laps without railing assist Mini squats in parallel bars heavy UE assist Standing reach down single side 5x right/left  Backwards walk in // bars 3 lap Walking in // bars with mirror to provide postural feedback Foot prop on 4 inch step with UE reach 10x Standing in // bars with UE push down on peanut ball 10x Standing in // bars with peanut roll forward and back on bars Attempted sit to stand pushing up from [facility] armrests (unable to complete needs heavy assist on // bars to rise) 4 inch forward step ups 4x right/left with heavy UE assist on // bars 2 min rest breaks 2x  RPE 6/10 end of session  10/28/23: Standing in // bars: weight shifting side to side, staggered stance front/back 8x each // bars: 6 laps with CGA with gait belt, 4 steps without UE support 5 trials Side stepping in // bars 2 laps Backwards wall in // bars 1 lap 2 inch forward step ups 8x right/left with heavy UE assist on // bars Mini squats in // bars 2 sets of 5 (2nd set with slightly less use of UE) In // bars multi directional random color circle taps forward, back and side (bil UE  assist needed) 2 min rest breaks 3x  RPE 5/10 end of session  10/22/23: Seated Freeform cable rows 7# on each side; x 10 80# cable assisted sit to stand 5x (heavy use of Ues 70%, 30% LE use) Standing in // bars: weight shifting side to side, staggered stance front/back 8x each // bars: 6 laps with CGA with gait belt, 4 steps without UE support 3 trials Side stepping in // bars 2 laps Stepping over dowels on ground in // bars(very  challenging) Backwards wall in // bars 1 lap RPE rated 2-3 however pt demonstrates signs of fatigue Car transfer: transport chair into car with set up assist only  10/20/23: Seated cable rows 10# 2x 10 Seated red power cord ankle plantarflexion (held by PT) 10x right/left 100# cable assisted sit to stand 2 sets of 5 (heavy use of Ues 70%, 30% LE use) RPE 6/10 // bars: 6 laps with CGA with gait belt verbal cues for increased step length and full trunk extension RPE "less b/c that was motivating and made me feel good"    PATIENT EDUCATION:  Education details: PT eval findings, anticipated POC, initial HEP, postural awareness, and transfer safety  Person educated: Patient and brother Education method: Explanation, Demonstration, and Handouts Education comprehension: verbalized understanding and returned demonstration  HOME EXERCISE PROGRAM: Access Code: [email or web address] URL: [email or web address] Date: 09/15/2023 Prepared by: Claude Manges  Exercises - Sit to Stand with Counter Support  - 3 x daily - 3-4 x weekly - 1 sets - 1-5 reps - Hooklying Clamshell with Resistance  - 2 x daily - 3-4 x weekly - 1 sets - 10 reps - Seated Long Arc Quad  - 1 x daily - 7 x weekly - 1-3 sets - 10 reps - 5 sec hold - Standing Hip Abduction with Counter Support  - 1 x daily - 7 x weekly - 1-2 sets - 10 reps - Standing March with Counter Support  - 1 x daily - 7 x weekly - 1-2 sets - 10 reps - Standing Heel Raise with Support  - 1 x daily - 7 x weekly - 1-2 sets - 10 reps - Shoulder External Rotation and Scapular Retraction with Resistance  - 1 x daily - 7 x weekly - 2 sets - 10 reps - Sit to Stand with Armchair  - 1 x daily - 7 x weekly - 1 sets - 5 reps  Patient Education - Tips to reduce freezing episodes with standing or walking   ASSESSMENT:  CLINICAL IMPRESSION: Ajay continues to make progress with increased standing tolerance as well as LE and trunk strength.  He requires heavy  assist with Ues to rise from sit to stand but he does report functional improvement with getting out of his recliner at home.   He would benefit from further strengthening to achieve his long term goal ( to be able to walk with least restrictive walking device).  He is progressing with goals but anticipate a slower progression of rehab secondary to numerous co-morbidities but without skilled PT he would likely decline in function.    OBJECTIVE IMPAIRMENTS: Abnormal gait, decreased balance, decreased ROM, decreased strength, decreased safety awareness, dizziness, impaired flexibility, impaired sensation, postural dysfunction, and pain.   ACTIVITY LIMITATIONS: carrying, lifting, bending, standing, squatting, stairs, transfers, bed mobility, continence, bathing, and locomotion level  PARTICIPATION LIMITATIONS: shopping and community activity  PERSONAL FACTORS: Age, Fitness, Time since onset of injury/illness/exacerbation, and 3+ comorbidities: Compression fx L1 2022, CAD, CKD (stage 4),  neuropathy, retinopathy  are also affecting patient's functional outcome.   REHAB POTENTIAL: Good  CLINICAL DECISION MAKING: Unstable/unpredictable  EVALUATION COMPLEXITY: High   GOALS: Goals reviewed with patient? Yes  SHORT TERM GOALS: Target date: 07/30/23 Improved TUG to < 25 sec to decrease fall risk.  Baseline: Goal status: IN PROGRESS 09/15/2023  2.  Able to perform 5XSTS without UE assist and controlled descent showing improved LE strength.  Baseline:  Goal status: IN PROGRESS 09/15/2023  3.  Patient will be able to stand safely for 2 min without UE assist Baseline:  Goal status:IN PROGRESS 09/15/2023  LONG TERM GOALS: Target date: 11/10/2023  Ind with advanced HEP Baseline:  Goal status: IN PROGRESS 09/15/2023  2. Patient will be able to ambulate 150' with LRAD with good safety to access community.  Baseline:  Goal status: IN PROGRESS 09/15/2023  3.  Patient able to perform bed mobility with  SBA Baseline:  Goal status: IN PROGRESS 09/15/2023  4.  Improved 30 sec chair test to 5 reps  Baseline:  Goal status: IN PROGRESS 09/15/2023  5.  Patient able to get in and out of his recliner at home with good control and no LOB Baseline:  Goal status: IN PROGRESS 09/15/2023    PLAN:  PT FREQUENCY:  2-3x week  PT DURATION: 8 weeks  PLANNED INTERVENTIONS: 97164- PT Re-evaluation, 97110-Therapeutic exercises, 97530- Therapeutic activity, 97112- Neuromuscular re-education, 97535- Self Care, 10960- Manual therapy, [phone]- Gait training, [phone]- Aquatic Therapy, 97014- Electrical stimulation (unattended), Patient/Family education, Balance training, Stair training, Taping, Joint mobilization, Spinal mobilization, DME instructions, Cryotherapy, and Moist heat  PLAN FOR NEXT SESSION: gait in // bars working on standing erect, longer step lengths;  assess energy levels and endurance ; functional strengthening; no leg press  Lavinia Sharps, PT 10/29/23 2:59 PM Phone: [phone] Fax: [phone]

## 2023-10-30 ENCOUNTER — Telehealth: Payer: Self-pay | Admitting: Family Medicine

## 2023-10-30 ENCOUNTER — Other Ambulatory Visit: Payer: Self-pay | Admitting: *Deleted

## 2023-10-30 DIAGNOSIS — E1169 Type 2 diabetes mellitus with other specified complication: Secondary | ICD-10-CM

## 2023-10-30 DIAGNOSIS — Z0000 Encounter for general adult medical examination without abnormal findings: Secondary | ICD-10-CM

## 2023-10-30 DIAGNOSIS — Z794 Long term (current) use of insulin: Secondary | ICD-10-CM

## 2023-10-30 NOTE — Telephone Encounter (Signed)
 Ok with me. Please place any necessary orders.

## 2023-10-30 NOTE — Telephone Encounter (Signed)
 Future lab order  Please schedule a lab appt prior to CPE

## 2023-10-30 NOTE — Telephone Encounter (Signed)
 Pt's spouse is requesting to have labs done prior to cpe.

## 2023-10-30 NOTE — Telephone Encounter (Signed)
 Ok to order labs prior to CPE?

## 2023-11-03 ENCOUNTER — Ambulatory Visit: Payer: PPO | Admitting: Physical Therapy

## 2023-11-05 ENCOUNTER — Telehealth: Payer: Self-pay | Admitting: Physical Therapy

## 2023-11-05 ENCOUNTER — Ambulatory Visit: Payer: PPO | Admitting: Physical Therapy

## 2023-11-05 NOTE — Telephone Encounter (Signed)
 Missed appt--Got appt times confused

## 2023-11-09 ENCOUNTER — Telehealth (HOSPITAL_BASED_OUTPATIENT_CLINIC_OR_DEPARTMENT_OTHER): Payer: Self-pay | Admitting: Cardiovascular Disease

## 2023-11-09 NOTE — Telephone Encounter (Signed)
 Patient calling in to see if he needs labs done before fu visit on 7/25. Please advise.

## 2023-11-10 ENCOUNTER — Ambulatory Visit: Payer: PPO | Attending: Family Medicine | Admitting: Physical Therapy

## 2023-11-10 DIAGNOSIS — R262 Difficulty in walking, not elsewhere classified: Secondary | ICD-10-CM | POA: Diagnosis not present

## 2023-11-10 DIAGNOSIS — M6281 Muscle weakness (generalized): Secondary | ICD-10-CM | POA: Insufficient documentation

## 2023-11-10 DIAGNOSIS — R2689 Other abnormalities of gait and mobility: Secondary | ICD-10-CM | POA: Insufficient documentation

## 2023-11-10 NOTE — Therapy (Signed)
 OUTPATIENT PHYSICAL THERAPY LOWER EXTREMITY TREATMENT/RECERTIFICATION  Patient Name: Jeffrey [name] MRN: [id number] DOB:[birth year], 68 y.o., male Today's Date: 11/10/2023    END OF SESSION:  PT End of Session - 11/10/23 1148     Visit Number 21    Date for PT Re-Evaluation 01/05/24    Authorization Type HTA    Progress Note Due on Visit 30    PT Start Time 1148    PT Stop Time 1230    PT Time Calculation (min) 42 min    Equipment Utilized During Treatment Gait belt    Activity Tolerance Patient tolerated treatment well                      Past Medical History:  Diagnosis Date   Cardiac arrest ([name]) 05/22/2016   Cataract    CHF (congestive heart failure) ([name])    CKD (chronic kidney disease) stage 3, GFR 30-59 ml/min ([name]) 05/28/2021   CKD (chronic kidney disease) stage 4, GFR 15-29 ml/min ([name]) 05/28/2021   Diabetes mellitus without complication ([name])    type 2   Diabetic retinopathy ([name])    Hyperlipidemia    Hypertension    Memory loss    mild   Myocardial infarct ([name]) 2105   Retinopathy    Both eyes   S/P primary angioplasty with coronary stent 05/22/2016   Vitamin B12 deficiency    Vitreous hemorrhage of left eye ([name])    proliferative diabetic retinopathy   Past Surgical History:  Procedure Laterality Date   ANGIOPLASTY     2015   COLONOSCOPY     multiple   EYE SURGERY     PARS PLANA VITRECTOMY Left 03/15/2020   Procedure: PARS PLANA VITRECTOMY WITH 25 GAUGE;  Surgeon: Stephannie Li, MD;  Location: [facility] OR;  Service: Ophthalmology;  Laterality: Left;   PHOTOCOAGULATION WITH LASER Left 03/15/2020   Procedure: PHOTOCOAGULATION WITH LASER; INTRAVITREAL INJECTION OF AVASTIN;  Surgeon: Stephannie Li, MD;  Location: [facility] OR;  Service: Ophthalmology;  Laterality: Left;   REFRACTIVE SURGERY     UPPER GI ENDOSCOPY     several   VITRECTOMY     Patient Active Problem List   Diagnosis Date Noted   Rhinitis 05/20/2023   Cervical spondylosis 08/26/2022    CKD (chronic kidney disease) stage 4, GFR 15-29 ml/min ([name]) 05/28/2021   Adjustment disorder 01/22/2021   Chronic back pain 10/22/2020   Peripheral vertigo 05/15/2020   Dermatitis 01/04/2020   Unintentional weight loss with loose stools 05/19/2019   Low vitamin B12 level 03/29/2019   Heme positive stool 03/14/2019   Normocytic anemia 03/14/2019   Chronic diastolic heart failure ([name]) 02/08/2019   Diabetic retinopathy ([name]) 09/08/2018   Physical debility 05/24/2018   Varicose veins of both lower extremities 03/01/2018   Fatigue due to exertion 10/14/2017   GERD (gastroesophageal reflux disease) 08/25/2017   CAD in native artery 04/14/2017   Hyperlipidemia associated with type 2 diabetes mellitus ([name]) 04/14/2017   Diabetic peripheral neuropathy associated with type 2 diabetes mellitus ([name]) 08/17/2016   [name] 05/02/2016   Hypertension associated with diabetes ([name]) 05/02/2016    PCP: Ardith Dark, MD   REFERRING PROVIDER: Ardith Dark, MD   REFERRING DIAG: R53.81 (ICD-10-CM) - Physical debility   THERAPY DIAG:  Muscle weakness (generalized)  Other abnormalities of gait and mobility  Difficulty in walking, not elsewhere classified  Rationale for Evaluation and Treatment: Rehabilitation  ONSET DATE: 2022  SUBJECTIVE:  SUBJECTIVE STATEMENT: Patient reports moderate fatigue on arrival from just having showered.  Arrives in transport chair.  Can walk from bedroom with sunroom at condo with RW about 40-50 feet.  Has to sit to brush teeth or shave on shower chair.    From eval: Patient has been to two different therapy places and not had good experiences. One at [facility], one private practice. I want to be able to walk without the walker and I'm having trouble getting out of chairs without the walker. Presently sits in a dining chair. Can't sit in his recliner in his bedroom anymore. Wants to work on balance also. Joined [employer] which  has a track and an elevator (on pause right now due to disability). Periodically legs shake when he is walking.   Brother: [name] identical twins  PERTINENT HISTORY: 3/11: evidence of lumbar compression deformity Compression fx L1 2022, CAD, CKD (stage 4), DM, cardiac arrest 2017, neuropathy, retinopathy Long history of gait abnormality beginning back in 2022. He has a history of CAD and believes that his debility began following a covid infection. In December odf 2022 he fell and suffered a compression fracture of L1.  PAIN:  Are you having pain? No  PRECAUTIONS: Fall  RED FLAGS: None   WEIGHT BEARING RESTRICTIONS: No  FALLS:  Has patient fallen in last 6 months? No and with sit to stand falls back sometimes  LIVING ENVIRONMENT: Lives with: lives with their family Lives in: House/apartment Stairs: No Has following equipment at home: Single point cane, Environmental consultant - 2 wheeled, Environmental consultant - 4 wheeled, shower chair, Grab bars, and stand up walker also; also has shower chair at sink for shaving, brushing teeth etc, also has transporter.  OCCUPATION: retired  PLOF: Independent with household mobility with device  PATIENT GOALS: to walk without a walker  NEXT MD VISIT: 09/28/22  OBJECTIVE:  Note: Objective measures were completed at Evaluation unless otherwise noted.  DIAGNOSTIC FINDINGS: N/A  COGNITION: Overall cognitive status: Within functional limits for tasks assessed     SENSATION: Neuropathy in B feet   MUSCLE LENGTH: Marked HS R>L, hip flexor tightness   POSTURE: rounded shoulders and forward head  LOWER EXTREMITY ROM: WFL for tasks assessed  LOWER EXTREMITY MMT: *tested in hooklying  MMT Right eval Left eval 4/1   Hip flexion 4 4 4  Bil  Hip extension Able to bridge Able to bridge 4- Bil  Hip abduction 4-* 4-* 4- Bil  Hip adduction     Hip internal rotation     Hip external rotation     Knee flexion     Knee extension 4 4+ 4- Bil  Ankle dorsiflexion 4+ 4-  4- Bil  Ankle plantarflexion     Ankle inversion     Ankle eversion      (Blank rows = not tested)  3/20:  max UE assist to rise sit to stand; LE hip extension, knee extension grossly 3+ to 4-/5  FUNCTIONAL TESTS:  30 seconds chair stand test Timed up and go (TUG): 59.36 with 4WW Stance time: 45 sec patient reports unsteadiness and feels he goes backwards 07/27/2023 92 ft decreased cadence; decreased step length; absent heel strike  12/26: 5x STS: unable to rise without heavy UE use; attempted to use armrests for test but does not come all the way up;  next trial with chair +cushion to touch at least one RW handle 48.2 sec:   TUG: cushion in seat +arm use and RW:  52.49  sec  08/13/23 attempted 5x STS but pt unable to complete: 1st rep does not come all the way up and then plops heavily into the transport wheelchair (he then recounts how this happened at home and how the chair tipped backwards) unable to complete reps Unable to complete TUG today, he does not have his 4 wheeled walker today and feels he is slower with the clinic RW  09/15/2023 TUG: 1:03 with rollator 5STS:48.55 sec heavy reliance on UE support; last repetition was a challenge  3/20: Heavy UE reliance with STS (unable to push up from [facility] armrests, pulls up from // bars) 4/1: max UE assist with sit to stand  GAIT: Distance walked: 25 Assistive device utilized: Walker - 4 wheeled Level of assistance: Modified independence Comments: slow speed, decreased step and stride, decreased heel strike, forward trunk lean.  TRANSFERS: Sit to stand: Uses walker to pull up; stand to sit: does not fully back up to chair and does not reach back; Supine to sit: needs min A for log roll  (has some back pain with sit <>supine due to sitting upward from supine) also fear of falling from bed, sit to supine needs min A to lift legs on to mat table The Patient-Specific Functional Scale  Initial:  I am going to ask you to identify up to  3 important activities that you are unable to do or are having difficulty with as a result of this problem.  Today are there any activities that you are unable to do or having difficulty with because of this?  (Patient shown scale and patient rated each activity)  Follow up: When you first came in you had difficulty performing these activities.  Today do you still have difficulty?  Patient-Specific activity scoring scheme (Point to one number):  0 1 2 3 4 5 6 7 8 9  10 Unable                                                                                                          Able to perform To perform                                                                                                    activity at the same Activity         Level as before  Injury or problem  Activity            Getting out of the recliner                                                                     Initial:       3                 2.                   Walking with RW 40 feet                                                                Initial:           1           3.                          Standing to brush teeth                                                           Initial:       0 (has to do sitting on shower chair)                 4. Putting on socks and shoes  while sitting                                                      initial 2-3  TODAY'S TREATMENT:                                                                                                                              DATE:   11/10/23: PSFS Discussed bringing in RW to measure gait distance (still come by transport to conserve energy) 3 rounds of walking and standing in // bars: round 1: 6:41, round 2: 9:21, round 3: 4:15 Standing in // bars: alternating toe taps Standing with alternating UE reaches 10x // bars: 6  laps with CGA with gait belt, 3 laps without railing assist; 3  laps with single side support working on arm swing Stepping over dowels on floor in // bars to encourage toe clearance and longer steps Mini squats in parallel bars heavy UE assist 10x Backwards walk in // bars 2 lap 2 min rest breaks between each round Seated in transport chair lat pulls 7# on each side 12x  RPE 9/10 end of session "I'm a lot more tired today"  10/28/23: Standing in // bars: alternating lateral toe taps Standing with alternating UE reaches 10x // bars: 6 laps with CGA with gait belt, 8 laps without railing assist Mini squats in parallel bars heavy UE assist Standing reach down single side 5x right/left  Backwards walk in // bars 3 lap Walking in // bars with mirror to provide postural feedback Foot prop on 4 inch step with UE reach 10x Standing in // bars with UE push down on peanut ball 10x Standing in // bars with peanut roll forward and back on bars Attempted sit to stand pushing up from [facility] armrests (unable to complete needs heavy assist on // bars to rise) 4 inch forward step ups 4x right/left with heavy UE assist on // bars 2 min rest breaks 2x  RPE 6/10 end of session  10/28/23: Standing in // bars: weight shifting side to side, staggered stance front/back 8x each // bars: 6 laps with CGA with gait belt, 4 steps without UE support 5 trials Side stepping in // bars 2 laps Backwards wall in // bars 1 lap 2 inch forward step ups 8x right/left with heavy UE assist on // bars Mini squats in // bars 2 sets of 5 (2nd set with slightly less use of UE) In // bars multi directional random color circle taps forward, back and side (bil UE assist needed) 2 min rest breaks 3x  RPE 5/10 end of session  10/22/23: Seated Freeform cable rows 7# on each side; x 10 80# cable assisted sit to stand 5x (heavy use of Ues 70%, 30% LE use) Standing in // bars: weight shifting side to side, staggered stance front/back 8x  each // bars: 6 laps with CGA with gait belt, 4 steps without UE support 3 trials Side stepping in // bars 2 laps Stepping over dowels on ground in // bars(very challenging) Backwards wall in // bars 1 lap RPE rated 2-3 however pt demonstrates signs of fatigue Car transfer: transport chair into car with set up assist only     PATIENT EDUCATION:  Education details: PT eval findings, anticipated POC, initial HEP, postural awareness, and transfer safety  Person educated: Patient and brother Education method: Explanation, Demonstration, and Handouts Education comprehension: verbalized understanding and returned demonstration  HOME EXERCISE PROGRAM: Access Code: [email or web address] URL: [email or web address] Date: 09/15/2023 Prepared by: Claude Manges  Exercises - Sit to Stand with Counter Support  - 3 x daily - 3-4 x weekly - 1 sets - 1-5 reps - Hooklying Clamshell with Resistance  - 2 x daily - 3-4 x weekly - 1 sets - 10 reps - Seated Long Arc Quad  - 1 x daily - 7 x weekly - 1-3 sets - 10 reps - 5 sec hold - Standing Hip Abduction with Counter Support  - 1 x daily - 7 x weekly - 1-2 sets - 10 reps - Standing March with Counter Support  - 1 x daily - 7 x weekly - 1-2 sets - 10 reps - Standing Heel Raise with Support  - 1 x daily - 7 x weekly -  1-2 sets - 10 reps - Shoulder External Rotation and Scapular Retraction with Resistance  - 1 x daily - 7 x weekly - 2 sets - 10 reps - Sit to Stand with Armchair  - 1 x daily - 7 x weekly - 1 sets - 5 reps  Patient Education - Tips to reduce freezing episodes with standing or walking   ASSESSMENT:  CLINICAL IMPRESSION: Lonzy is progressing with PT with improved stance time and gait using the parallel bars.  He has steadily increased his tolerance to be on his feet between 4 min and 9 minutes with UE support.  Treatment focus on decreasing UE support with gait, increased step length and forward gaze.  Lower extremity weakness  particularly in quads requires the heavy use of upper extremities with sit to stand transitions.  Expect slower progress over time due to numerous co-morbidities.  CGA provided for safety. The patient would benefit from a continuation of skilled PT for a further progression of strengthening and functional mobility.    OBJECTIVE IMPAIRMENTS: Abnormal gait, decreased balance, decreased ROM, decreased strength, decreased safety awareness, dizziness, impaired flexibility, impaired sensation, postural dysfunction, and pain.   ACTIVITY LIMITATIONS: carrying, lifting, bending, standing, squatting, stairs, transfers, bed mobility, continence, bathing, and locomotion level  PARTICIPATION LIMITATIONS: shopping and community activity  PERSONAL FACTORS: Age, Fitness, Time since onset of injury/illness/exacerbation, and 3+ comorbidities: Compression fx L1 2022, CAD, CKD (stage 4), neuropathy, retinopathy  are also affecting patient's functional outcome.   REHAB POTENTIAL: Good  CLINICAL DECISION MAKING: Unstable/unpredictable  EVALUATION COMPLEXITY: High   GOALS: Goals reviewed with patient? Yes  SHORT TERM GOALS: Target date: 07/30/23 Improved TUG to < 25 sec to decrease fall risk.  Baseline: Goal status: IN PROGRESS 09/15/2023  2.  Able to perform 5XSTS without UE assist and controlled descent showing improved LE strength.  Baseline:  Goal status: IN PROGRESS 09/15/2023  3.  Patient will be able to stand safely for 2 min without UE assist Baseline:  Goal status:IN PROGRESS 09/15/2023  LONG TERM GOALS: Target date: 01/05/2024  Ind with advanced HEP Baseline:  Goal status: IN PROGRESS 09/15/2023  2.  Sit to stand from the recliner with PSFS score of 5 Baseline:  Goal status: revised   3.  Gait with RW from the bedroom to the sunroom 40-50 feet with PSFS of 3 Baseline:  Goal status:revised   4. Improved LE mobility needed for greater ease with putting on socks and shoes in sitting with PSFS  of 5 Baseline:  Goal status: revised 5.  Patient will be able to stand long enough to brush teeth PSFS of 2 Baseline:  Goal status: revised   PLAN:  PT FREQUENCY:  2x week  PT DURATION: 8 weeks  PLANNED INTERVENTIONS: 97164- PT Re-evaluation, 97110-Therapeutic exercises, 97530- Therapeutic activity, 97112- Neuromuscular re-education, 97535- Self Care, [id number]- Manual therapy, [phone]- Gait training, [phone]- Aquatic Therapy, 97014- Electrical stimulation (unattended), Patient/Family education, Balance training, Stair training, Taping, Joint mobilization, Spinal mobilization, DME instructions, Cryotherapy, and Moist heat  PLAN FOR NEXT SESSION: gait in // bars working on standing erect, longer step lengths;  assess energy levels and endurance ; functional strengthening; no leg press  Lavinia Sharps, PT 11/10/23 8:59 PM Phone: [phone] Fax: [phone]

## 2023-11-11 ENCOUNTER — Other Ambulatory Visit: Payer: Self-pay | Admitting: Family Medicine

## 2023-11-12 ENCOUNTER — Ambulatory Visit: Payer: PPO | Admitting: Physical Therapy

## 2023-11-12 DIAGNOSIS — M6281 Muscle weakness (generalized): Secondary | ICD-10-CM

## 2023-11-12 DIAGNOSIS — R2689 Other abnormalities of gait and mobility: Secondary | ICD-10-CM

## 2023-11-12 DIAGNOSIS — R262 Difficulty in walking, not elsewhere classified: Secondary | ICD-10-CM

## 2023-11-12 NOTE — Therapy (Signed)
 OUTPATIENT PHYSICAL THERAPY LOWER EXTREMITY TREATMENT  Patient Name: Jeffrey [name] MRN: [id number] DOB:1955/09/04, 68 y.o., male Today's Date: 11/12/2023    END OF SESSION:  PT End of Session - 11/12/23 1146     Visit Number 22    Date for PT Re-Evaluation 01/05/24    Authorization Type HTA    Progress Note Due on Visit 30    PT Start Time 1147    PT Stop Time 1230    PT Time Calculation (min) 43 min    Equipment Utilized During Treatment Gait belt    Activity Tolerance Patient tolerated treatment well                      Past Medical History:  Diagnosis Date   Cardiac arrest ([name]) 05/22/2016   Cataract    CHF (congestive heart failure) ([name])    CKD (chronic kidney disease) stage 3, GFR 30-59 ml/min ([name]) 05/28/2021   CKD (chronic kidney disease) stage 4, GFR 15-29 ml/min ([name]) 05/28/2021   Diabetes mellitus without complication ([name])    type 2   Diabetic retinopathy ([name])    Hyperlipidemia    Hypertension    Memory loss    mild   Myocardial infarct ([name]) 2105   Retinopathy    Both eyes   S/P primary angioplasty with coronary stent 05/22/2016   Vitamin B12 deficiency    Vitreous hemorrhage of left eye ([name])    proliferative diabetic retinopathy   Past Surgical History:  Procedure Laterality Date   ANGIOPLASTY     2015   COLONOSCOPY     multiple   EYE SURGERY     PARS PLANA VITRECTOMY Left 03/15/2020   Procedure: PARS PLANA VITRECTOMY WITH 25 GAUGE;  Surgeon: Stephannie Li, MD;  Location: [facility] OR;  Service: Ophthalmology;  Laterality: Left;   PHOTOCOAGULATION WITH LASER Left 03/15/2020   Procedure: PHOTOCOAGULATION WITH LASER; INTRAVITREAL INJECTION OF AVASTIN;  Surgeon: Stephannie Li, MD;  Location: [facility] OR;  Service: Ophthalmology;  Laterality: Left;   REFRACTIVE SURGERY     UPPER GI ENDOSCOPY     several   VITRECTOMY     Patient Active Problem List   Diagnosis Date Noted   Rhinitis 05/20/2023   Cervical spondylosis 08/26/2022   CKD (chronic  kidney disease) stage 4, GFR 15-29 ml/min ([name]) 05/28/2021   Adjustment disorder 01/22/2021   Chronic back pain 10/22/2020   Peripheral vertigo 05/15/2020   Dermatitis 01/04/2020   Unintentional weight loss with loose stools 05/19/2019   Low vitamin B12 level 03/29/2019   Heme positive stool 03/14/2019   Normocytic anemia 03/14/2019   Chronic diastolic heart failure ([name]) 02/08/2019   Diabetic retinopathy ([name]) 09/08/2018   Physical debility 05/24/2018   Varicose veins of both lower extremities 03/01/2018   Fatigue due to exertion 10/14/2017   GERD (gastroesophageal reflux disease) 08/25/2017   CAD in native artery 04/14/2017   Hyperlipidemia associated with type 2 diabetes mellitus ([name]) 04/14/2017   Diabetic peripheral neuropathy associated with type 2 diabetes mellitus ([name]) 08/17/2016   [name] 05/02/2016   Hypertension associated with diabetes ([name]) 05/02/2016    PCP: Ardith Dark, MD   REFERRING PROVIDER: Ardith Dark, MD   REFERRING DIAG: R53.81 (ICD-10-CM) - Physical debility   THERAPY DIAG:  Muscle weakness (generalized)  Other abnormalities of gait and mobility  Difficulty in walking, not elsewhere classified  Rationale for Evaluation and Treatment: Rehabilitation  ONSET DATE: 2022  SUBJECTIVE:  SUBJECTIVE STATEMENT: Arrives with 4 wheeled rollator. Wearing gloves due to pressure on his hands using the walker. States he's really tired today. No transport chair   From eval: Patient has been to two different therapy places and not had good experiences. One at [facility], one private practice. I want to be able to walk without the walker and I'm having trouble getting out of chairs without the walker. Presently sits in a dining chair. Can't sit in his recliner in his bedroom anymore. Wants to work on balance also. Joined [employer] which has a track and an elevator (on pause right now due to disability). Periodically legs shake  when he is walking.   Brother: [name] identical twins  PERTINENT HISTORY: 3/11: evidence of lumbar compression deformity Compression fx L1 2022, CAD, CKD (stage 4), DM, cardiac arrest 2017, neuropathy, retinopathy Long history of gait abnormality beginning back in 2022. He has a history of CAD and believes that his debility began following a covid infection. In December odf 2022 he fell and suffered a compression fracture of L1.  PAIN:  Are you having pain? No  PRECAUTIONS: Fall  RED FLAGS: None   WEIGHT BEARING RESTRICTIONS: No  FALLS:  Has patient fallen in last 6 months? No and with sit to stand falls back sometimes  LIVING ENVIRONMENT: Lives with: lives with their family Lives in: House/apartment Stairs: No Has following equipment at home: Single point cane, Environmental consultant - 2 wheeled, Environmental consultant - 4 wheeled, shower chair, Grab bars, and stand up walker also; also has shower chair at sink for shaving, brushing teeth etc, also has transporter.  OCCUPATION: retired  PLOF: Independent with household mobility with device  PATIENT GOALS: to walk without a walker  NEXT MD VISIT: 09/28/22  OBJECTIVE:  Note: Objective measures were completed at Evaluation unless otherwise noted.  DIAGNOSTIC FINDINGS: N/A  COGNITION: Overall cognitive status: Within functional limits for tasks assessed     SENSATION: Neuropathy in B feet   MUSCLE LENGTH: Marked HS R>L, hip flexor tightness   POSTURE: rounded shoulders and forward head  LOWER EXTREMITY ROM: WFL for tasks assessed  LOWER EXTREMITY MMT: *tested in hooklying  MMT Right eval Left eval 4/1   Hip flexion 4 4 4  Bil  Hip extension Able to bridge Able to bridge 4- Bil  Hip abduction 4-* 4-* 4- Bil  Hip adduction     Hip internal rotation     Hip external rotation     Knee flexion     Knee extension 4 4+ 4- Bil  Ankle dorsiflexion 4+ 4- 4- Bil  Ankle plantarflexion     Ankle inversion     Ankle eversion      (Blank rows =  not tested)  3/20:  max UE assist to rise sit to stand; LE hip extension, knee extension grossly 3+ to 4-/5  FUNCTIONAL TESTS:  30 seconds chair stand test Timed up and go (TUG): 59.36 with 4WW Stance time: 45 sec patient reports unsteadiness and feels he goes backwards 07/27/2023 92 ft decreased cadence; decreased step length; absent heel strike  12/26: 5x STS: unable to rise without heavy UE use; attempted to use armrests for test but does not come all the way up;  next trial with chair +cushion to touch at least one RW handle 48.2 sec:   TUG: cushion in seat +arm use and RW:  52.49 sec  08/13/23 attempted 5x STS but pt unable to complete: 1st rep does not come all the  way up and then plops heavily into the transport wheelchair (he then recounts how this happened at home and how the chair tipped backwards) unable to complete reps Unable to complete TUG today, he does not have his 4 wheeled walker today and feels he is slower with the clinic RW  09/15/2023 TUG: 1:03 with rollator 5STS:48.55 sec heavy reliance on UE support; last repetition was a challenge  3/20: Heavy UE reliance with STS (unable to push up from [facility] armrests, pulls up from // bars) 4/1: max UE assist with sit to stand  GAIT: Distance walked: 25 Assistive device utilized: Walker - 4 wheeled Level of assistance: Modified independence Comments: slow speed, decreased step and stride, decreased heel strike, forward trunk lean.  TRANSFERS: Sit to stand: Uses walker to pull up; stand to sit: does not fully back up to chair and does not reach back; Supine to sit: needs min A for log roll  (has some back pain with sit <>supine due to sitting upward from supine) also fear of falling from bed, sit to supine needs min A to lift legs on to mat table The Patient-Specific Functional Scale  Initial:  I am going to ask you to identify up to 3 important activities that you are unable to do or are having difficulty with as a result  of this problem.  Today are there any activities that you are unable to do or having difficulty with because of this?  (Patient shown scale and patient rated each activity)  Follow up: When you first came in you had difficulty performing these activities.  Today do you still have difficulty?  Patient-Specific activity scoring scheme (Point to one number):  0 1 2 3 4 5 6 7 8 9  10 Unable                                                                                                          Able to perform To perform                                                                                                    activity at the same Activity         Level as before  Injury or problem  Activity            Getting out of the recliner                                                                     Initial:       3                 2.                   Walking with RW 40 feet                                                                Initial:           1           3.                          Standing to brush teeth                                                           Initial:       0 (has to do sitting on shower chair)                 4. Putting on socks and shoes  while sitting                                                      initial 2-3  TODAY'S TREATMENT:                                                                                                                              DATE:   11/12/23: Gait with 4 wheeled RW 25 feet with decreased gait speed, heavy reliance on UEs 3 rounds of walking and standing in // bars: approx 6 min each round Standing in // bars: alternating toe taps to foam Standing with alternating UE reaches 10x Foot prop on foam with head turns/arm reaches // bars: 6 laps with CGA with gait belt, 3 laps  without railing assist; 3 laps with  single side support working on arm swing and longer step lengths Used metronome 58 setting to increase gait speed in // bars  Seated 3# LAQ 10x right/left Seated 3# hip flexion up and over line 10x right/left Static standing within // bars no touch (moderate challenge to balance)  11/10/23: PSFS Discussed bringing in RW to measure gait distance (still come by transport to conserve energy) 3 rounds of walking and standing in // bars: round 1: 6:41, round 2: 9:21, round 3: 4:15 Standing in // bars: alternating toe taps Standing with alternating UE reaches 10x // bars: 6 laps with CGA with gait belt, 3 laps without railing assist; 3 laps with single side support working on arm swing Stepping over dowels on floor in // bars to encourage toe clearance and longer steps Mini squats in parallel bars heavy UE assist 10x Backwards walk in // bars 2 lap 2 min rest breaks between each round Seated in transport chair lat pulls 7# on each side 12x  RPE 9/10 end of session "I'm a lot more tired today"  10/28/23: Standing in // bars: alternating lateral toe taps Standing with alternating UE reaches 10x // bars: 6 laps with CGA with gait belt, 8 laps without railing assist Mini squats in parallel bars heavy UE assist Standing reach down single side 5x right/left  Backwards walk in // bars 3 lap Walking in // bars with mirror to provide postural feedback Foot prop on 4 inch step with UE reach 10x Standing in // bars with UE push down on peanut ball 10x Standing in // bars with peanut roll forward and back on bars Attempted sit to stand pushing up from [facility] armrests (unable to complete needs heavy assist on // bars to rise) 4 inch forward step ups 4x right/left with heavy UE assist on // bars 2 min rest breaks 2x  RPE 6/10 end of session  10/28/23: Standing in // bars: weight shifting side to side, staggered stance front/back 8x each // bars: 6 laps with CGA with gait belt, 4 steps without UE support 5  trials Side stepping in // bars 2 laps Backwards wall in // bars 1 lap 2 inch forward step ups 8x right/left with heavy UE assist on // bars Mini squats in // bars 2 sets of 5 (2nd set with slightly less use of UE) In // bars multi directional random color circle taps forward, back and side (bil UE assist needed) 2 min rest breaks 3x  RPE 5/10 end of session    PATIENT EDUCATION:  Education details: PT eval findings, anticipated POC, initial HEP, postural awareness, and transfer safety  Person educated: Patient and brother Education method: Explanation, Demonstration, and Handouts Education comprehension: verbalized understanding and returned demonstration  HOME EXERCISE PROGRAM: Access Code: [email or web address] URL: [email or web address] Date: 09/15/2023 Prepared by: Claude Manges  Exercises - Sit to Stand with Counter Support  - 3 x daily - 3-4 x weekly - 1 sets - 1-5 reps - Hooklying Clamshell with Resistance  - 2 x daily - 3-4 x weekly - 1 sets - 10 reps - Seated Long Arc Quad  - 1 x daily - 7 x weekly - 1-3 sets - 10 reps - 5 sec hold - Standing Hip Abduction with Counter Support  - 1 x daily - 7 x weekly - 1-2 sets - 10 reps - Standing March with Counter Support  - 1 x daily - 7 x weekly - 1-2 sets -  10 reps - Standing Heel Raise with Support  - 1 x daily - 7 x weekly - 1-2 sets - 10 reps - Shoulder External Rotation and Scapular Retraction with Resistance  - 1 x daily - 7 x weekly - 2 sets - 10 reps - Sit to Stand with Armchair  - 1 x daily - 7 x weekly - 1 sets - 5 reps  Patient Education - Tips to reduce freezing episodes with standing or walking   ASSESSMENT:  CLINICAL IMPRESSION: Good response with verbal cues to increase step length.  He does fatigue with standing with reports of right UE pain due to heavy reliance on Ues for support and some back pain as well with prolonged standing.  CGA with gait belt needed for safety.  Too fatigued to exit the clinic with RW  therefore used clinic wheelchair to the car.   OBJECTIVE IMPAIRMENTS: Abnormal gait, decreased balance, decreased ROM, decreased strength, decreased safety awareness, dizziness, impaired flexibility, impaired sensation, postural dysfunction, and pain.   ACTIVITY LIMITATIONS: carrying, lifting, bending, standing, squatting, stairs, transfers, bed mobility, continence, bathing, and locomotion level  PARTICIPATION LIMITATIONS: shopping and community activity  PERSONAL FACTORS: Age, Fitness, Time since onset of injury/illness/exacerbation, and 3+ comorbidities: Compression fx L1 2022, CAD, CKD (stage 4), neuropathy, retinopathy  are also affecting patient's functional outcome.   REHAB POTENTIAL: Good  CLINICAL DECISION MAKING: Unstable/unpredictable  EVALUATION COMPLEXITY: High   GOALS: Goals reviewed with patient? Yes  SHORT TERM GOALS: Target date: 07/30/23 Improved TUG to < 25 sec to decrease fall risk.  Baseline: Goal status: IN PROGRESS 09/15/2023  2.  Able to perform 5XSTS without UE assist and controlled descent showing improved LE strength.  Baseline:  Goal status: IN PROGRESS 09/15/2023  3.  Patient will be able to stand safely for 2 min without UE assist Baseline:  Goal status:IN PROGRESS 09/15/2023  LONG TERM GOALS: Target date: 01/05/2024  Ind with advanced HEP Baseline:  Goal status: IN PROGRESS 09/15/2023  2.  Sit to stand from the recliner with PSFS score of 5 Baseline:  Goal status: revised   3.  Gait with RW from the bedroom to the sunroom 40-50 feet with PSFS of 3 Baseline:  Goal status:revised   4. Improved LE mobility needed for greater ease with putting on socks and shoes in sitting with PSFS of 5 Baseline:  Goal status: revised 5.  Patient will be able to stand long enough to brush teeth PSFS of 2 Baseline:  Goal status: revised   PLAN:  PT FREQUENCY:  2x week  PT DURATION: 8 weeks  PLANNED INTERVENTIONS: 97164- PT Re-evaluation, 97110-Therapeutic  exercises, 97530- Therapeutic activity, 97112- Neuromuscular re-education, 97535- Self Care, 16109- Manual therapy, [phone]- Gait training, [phone]- Aquatic Therapy, 97014- Electrical stimulation (unattended), Patient/Family education, Balance training, Stair training, Taping, Joint mobilization, Spinal mobilization, DME instructions, Cryotherapy, and Moist heat  PLAN FOR NEXT SESSION: gait in // bars working on standing erect, longer step lengths;  assess energy levels and endurance ; functional strengthening; no leg press  Lavinia Sharps, PT 11/12/23 7:12 PM Phone: [phone] Fax: [phone]

## 2023-11-14 ENCOUNTER — Other Ambulatory Visit: Payer: Self-pay | Admitting: Family Medicine

## 2023-11-17 ENCOUNTER — Ambulatory Visit: Admitting: Physical Therapy

## 2023-11-17 DIAGNOSIS — R262 Difficulty in walking, not elsewhere classified: Secondary | ICD-10-CM

## 2023-11-17 DIAGNOSIS — M6281 Muscle weakness (generalized): Secondary | ICD-10-CM

## 2023-11-17 DIAGNOSIS — R2689 Other abnormalities of gait and mobility: Secondary | ICD-10-CM

## 2023-11-17 NOTE — Therapy (Signed)
 OUTPATIENT PHYSICAL THERAPY LOWER EXTREMITY TREATMENT  Patient Name: Jeffrey [name] MRN: [id number] DOB:July 20, 1956, 68 y.o., male Today's Date: 11/17/2023    END OF SESSION:  PT End of Session - 11/17/23 1145     Visit Number 23    Date for PT Re-Evaluation 01/05/24    Authorization Type HTA    Progress Note Due on Visit 30    PT Start Time 1145    PT Stop Time 1230    PT Time Calculation (min) 45 min    Activity Tolerance Patient tolerated treatment well                      Past Medical History:  Diagnosis Date   Cardiac arrest ([name]) 05/22/2016   Cataract    CHF (congestive heart failure) ([name])    CKD (chronic kidney disease) stage 3, GFR 30-59 ml/min ([name]) 05/28/2021   CKD (chronic kidney disease) stage 4, GFR 15-29 ml/min ([name]) 05/28/2021   Diabetes mellitus without complication ([name])    type 2   Diabetic retinopathy ([name])    Hyperlipidemia    Hypertension    Memory loss    mild   Myocardial infarct ([name]) 2105   Retinopathy    Both eyes   S/P primary angioplasty with coronary stent 05/22/2016   Vitamin B12 deficiency    Vitreous hemorrhage of left eye ([name])    proliferative diabetic retinopathy   Past Surgical History:  Procedure Laterality Date   ANGIOPLASTY     2015   COLONOSCOPY     multiple   EYE SURGERY     PARS PLANA VITRECTOMY Left 03/15/2020   Procedure: PARS PLANA VITRECTOMY WITH 25 GAUGE;  Surgeon: Stephannie Li, MD;  Location: [facility] OR;  Service: Ophthalmology;  Laterality: Left;   PHOTOCOAGULATION WITH LASER Left 03/15/2020   Procedure: PHOTOCOAGULATION WITH LASER; INTRAVITREAL INJECTION OF AVASTIN;  Surgeon: Stephannie Li, MD;  Location: [facility] OR;  Service: Ophthalmology;  Laterality: Left;   REFRACTIVE SURGERY     UPPER GI ENDOSCOPY     several   VITRECTOMY     Patient Active Problem List   Diagnosis Date Noted   Rhinitis 05/20/2023   Cervical spondylosis 08/26/2022   CKD (chronic kidney disease) stage 4, GFR 15-29 ml/min ([name])  05/28/2021   Adjustment disorder 01/22/2021   Chronic back pain 10/22/2020   Peripheral vertigo 05/15/2020   Dermatitis 01/04/2020   Unintentional weight loss with loose stools 05/19/2019   Low vitamin B12 level 03/29/2019   Heme positive stool 03/14/2019   Normocytic anemia 03/14/2019   Chronic diastolic heart failure ([name]) 02/08/2019   Diabetic retinopathy ([name]) 09/08/2018   Physical debility 05/24/2018   Varicose veins of both lower extremities 03/01/2018   Fatigue due to exertion 10/14/2017   GERD (gastroesophageal reflux disease) 08/25/2017   CAD in native artery 04/14/2017   Hyperlipidemia associated with type 2 diabetes mellitus ([name]) 04/14/2017   Diabetic peripheral neuropathy associated with type 2 diabetes mellitus ([name]) 08/17/2016   [name] 05/02/2016   Hypertension associated with diabetes ([name]) 05/02/2016    PCP: Ardith Dark, MD   REFERRING PROVIDER: Ardith Dark, MD   REFERRING DIAG: R53.81 (ICD-10-CM) - Physical debility   THERAPY DIAG:  Muscle weakness (generalized)  Other abnormalities of gait and mobility  Difficulty in walking, not elsewhere classified  Rationale for Evaluation and Treatment: Rehabilitation  ONSET DATE: 2022  SUBJECTIVE:   SUBJECTIVE STATEMENT: Arrives with 4 wheeled rollator.  States ascending ramp into clinic is difficult.   From eval: Patient has been to two different therapy places and not had good experiences. One at [facility], one private practice. I want to be able to walk without the walker and I'm having trouble getting out of chairs without the walker. Presently sits in a dining chair. Can't sit in his recliner in his bedroom anymore. Wants to work on balance also. Joined [employer] which has a track and an elevator (on pause right now due to disability). Periodically legs shake when he is walking.   Brother: Jeffrey [name] identical twins  PERTINENT HISTORY: 3/11: evidence of lumbar compression  deformity Compression fx L1 2022, CAD, CKD (stage 4), DM, cardiac arrest 2017, neuropathy, retinopathy Long history of gait abnormality beginning back in 2022. He has a history of CAD and believes that his debility began following a covid infection. In December odf 2022 he fell and suffered a compression fracture of L1.  PAIN:  Are you having pain? No  PRECAUTIONS: Fall  RED FLAGS: None   WEIGHT BEARING RESTRICTIONS: No  FALLS:  Has patient fallen in last 6 months? No and with sit to stand falls back sometimes  LIVING ENVIRONMENT: Lives with: lives with their family Lives in: House/apartment Stairs: No Has following equipment at home: Single point cane, Environmental consultant - 2 wheeled, Environmental consultant - 4 wheeled, shower chair, Grab bars, and stand up walker also; also has shower chair at sink for shaving, brushing teeth etc, also has transporter.  OCCUPATION: retired  PLOF: Independent with household mobility with device  PATIENT GOALS: to walk without a walker  NEXT MD VISIT: 09/28/22  OBJECTIVE:  Note: Objective measures were completed at Evaluation unless otherwise noted.  DIAGNOSTIC FINDINGS: N/A  COGNITION: Overall cognitive status: Within functional limits for tasks assessed     SENSATION: Neuropathy in B feet   MUSCLE LENGTH: Marked HS R>L, hip flexor tightness   POSTURE: rounded shoulders and forward head  LOWER EXTREMITY ROM: WFL for tasks assessed  LOWER EXTREMITY MMT: *tested in hooklying  MMT Right eval Left eval 4/1   Hip flexion 4 4 4  Bil  Hip extension Able to bridge Able to bridge 4- Bil  Hip abduction 4-* 4-* 4- Bil  Hip adduction     Hip internal rotation     Hip external rotation     Knee flexion     Knee extension 4 4+ 4- Bil  Ankle dorsiflexion 4+ 4- 4- Bil  Ankle plantarflexion     Ankle inversion     Ankle eversion      (Blank rows = not tested)  3/20:  max UE assist to rise sit to stand; LE hip extension, knee extension grossly 3+ to  4-/5  FUNCTIONAL TESTS:  30 seconds chair stand test Timed up and go (TUG): 59.36 with 4WW Stance time: 45 sec patient reports unsteadiness and feels he goes backwards 07/27/2023 92 ft decreased cadence; decreased step length; absent heel strike  12/26: 5x STS: unable to rise without heavy UE use; attempted to use armrests for test but does not come all the way up;  next trial with chair +cushion to touch at least one RW handle 48.2 sec:   TUG: cushion in seat +arm use and RW:  52.49 sec  08/13/23 attempted 5x STS but pt unable to complete: 1st rep does not come all the way up and then plops heavily into the transport wheelchair (he then recounts how this happened at home and  how the chair tipped backwards) unable to complete reps Unable to complete TUG today, he does not have his 4 wheeled walker today and feels he is slower with the clinic RW  09/15/2023 TUG: 1:03 with rollator 5STS:48.55 sec heavy reliance on UE support; last repetition was a challenge  3/20: Heavy UE reliance with STS (unable to push up from [facility] armrests, pulls up from // bars) 4/1: max UE assist with sit to stand  GAIT: Distance walked: 25 Assistive device utilized: Walker - 4 wheeled Level of assistance: Modified independence Comments: slow speed, decreased step and stride, decreased heel strike, forward trunk lean.  TRANSFERS: Sit to stand: Uses walker to pull up; stand to sit: does not fully back up to chair and does not reach back; Supine to sit: needs min A for log roll  (has some back pain with sit <>supine due to sitting upward from supine) also fear of falling from bed, sit to supine needs min A to lift legs on to mat table The Patient-Specific Functional Scale  Initial:  I am going to ask you to identify up to 3 important activities that you are unable to do or are having difficulty with as a result of this problem.  Today are there any activities that you are unable to do or having difficulty with  because of this?  (Patient shown scale and patient rated each activity)  Follow up: When you first came in you had difficulty performing these activities.  Today do you still have difficulty?  Patient-Specific activity scoring scheme (Point to one number):  0 1 2 3 4 5 6 7 8 9  10 Unable                                                                                                          Able to perform To perform                                                                                                    activity at the same Activity         Level as before  Injury or problem  Activity            Getting out of the recliner                                                                     Initial:       3                 2.                   Walking with RW 40 feet                                                                Initial:           1           3.                          Standing to brush teeth                                                           Initial:       0 (has to do sitting on shower chair)                 4. Putting on socks and shoes  while sitting                                                      initial 2-3  TODAY'S TREATMENT:                                                                                                                              DATE:   11/17/23: Gait with 4 wheeled RW 25 feet with decreased gait speed, heavy reliance on Ues Standing within // bars: teal 35# power cord around upper trunk (anchored by therapist) isometric hold of standing tall 60 sec x2 Static standing UE reaching to therapist hands in multiple planes to simulate brushing teeth and performing other ADLS at the sink 3 rounds of walking and  standing in // bars: approx 6 min each round 2 inch forward step ups 2 sets of 5 (2nd set with single arm  support) Stepping stones/circles in // bars to encourage longer step lengths 4 laps Seated 2# bicep curls 10x right/left Seated 2# overhead press 10x right/left Gait with RW 20 feet to lobby, seated rest 2 min Gait with RW from lobby, down outside ramp to the car CGA; transport chair following behind in case of over fatigue but not used Encouraged regular walking at home with RW with brother assisting for safety 11/12/23: Gait with 4 wheeled RW 25 feet with decreased gait speed, heavy reliance on UEs 3 rounds of walking and standing in // bars: approx 6 min each round Standing in // bars: alternating toe taps to foam Standing with alternating UE reaches 10x Foot prop on foam with head turns/arm reaches // bars: 6 laps with CGA with gait belt, 3 laps without railing assist; 3 laps with single side support working on arm swing and longer step lengths Used metronome 58 setting to increase gait speed in // bars  Seated 3# LAQ 10x right/left Seated 3# hip flexion up and over line 10x right/left Static standing within // bars no touch (moderate challenge to balance)  11/10/23: PSFS Discussed bringing in RW to measure gait distance (still come by transport to conserve energy) 3 rounds of walking and standing in // bars: round 1: 6:41, round 2: 9:21, round 3: 4:15 Standing in // bars: alternating toe taps Standing with alternating UE reaches 10x // bars: 6 laps with CGA with gait belt, 3 laps without railing assist; 3 laps with single side support working on arm swing Stepping over dowels on floor in // bars to encourage toe clearance and longer steps Mini squats in parallel bars heavy UE assist 10x Backwards walk in // bars 2 lap 2 min rest breaks between each round Seated in transport chair lat pulls 7# on each side 12x  RPE 9/10 end of session "I'm a lot more tired today"  10/28/23: Standing in // bars: alternating lateral toe taps Standing with alternating UE reaches 10x // bars: 6 laps  with CGA with gait belt, 8 laps without railing assist Mini squats in parallel bars heavy UE assist Standing reach down single side 5x right/left  Backwards walk in // bars 3 lap Walking in // bars with mirror to provide postural feedback Foot prop on 4 inch step with UE reach 10x Standing in // bars with UE push down on peanut ball 10x Standing in // bars with peanut roll forward and back on bars Attempted sit to stand pushing up from [facility] armrests (unable to complete needs heavy assist on // bars to rise) 4 inch forward step ups 4x right/left with heavy UE assist on // bars 2 min rest breaks 2x  RPE 6/10 end of session  10/28/23: Standing in // bars: weight shifting side to side, staggered stance front/back 8x each // bars: 6 laps with CGA with gait belt, 4 steps without UE support 5 trials Side stepping in // bars 2 laps Backwards wall in // bars 1 lap 2 inch forward step ups 8x right/left with heavy UE assist on // bars Mini squats in // bars 2 sets of 5 (2nd set with slightly less use of UE) In // bars multi directional random color circle taps forward, back and side (bil UE assist needed) 2 min rest breaks 3x  RPE 5/10 end of session  PATIENT EDUCATION:  Education details: PT eval findings, anticipated POC, initial HEP, postural awareness, and transfer safety  Person educated: Patient and brother Education method: Explanation, Demonstration, and Handouts Education comprehension: verbalized understanding and returned demonstration  HOME EXERCISE PROGRAM: Access Code: [email or web address] URL: [email or web address] Date: 09/15/2023 Prepared by: Claude Manges  Exercises - Sit to Stand with Counter Support  - 3 x daily - 3-4 x weekly - 1 sets - 1-5 reps - Hooklying Clamshell with Resistance  - 2 x daily - 3-4 x weekly - 1 sets - 10 reps - Seated Long Arc Quad  - 1 x daily - 7 x weekly - 1-3 sets - 10 reps - 5 sec hold - Standing Hip Abduction with Counter Support  - 1 x  daily - 7 x weekly - 1-2 sets - 10 reps - Standing March with Counter Support  - 1 x daily - 7 x weekly - 1-2 sets - 10 reps - Standing Heel Raise with Support  - 1 x daily - 7 x weekly - 1-2 sets - 10 reps - Shoulder External Rotation and Scapular Retraction with Resistance  - 1 x daily - 7 x weekly - 2 sets - 10 reps - Sit to Stand with Armchair  - 1 x daily - 7 x weekly - 1 sets - 5 reps  Patient Education - Tips to reduce freezing episodes with standing or walking   ASSESSMENT:  CLINICAL IMPRESSION: Good response to trunk extension isometric with resistance to encourage upright standing posture.  Some discomfort with static standing in low back but improved balance noted.   Decreased lean of hands on // bars noted with static standing and gait.  Good control with descending ramp.  Therapist providing CGA for safety whenever standing or ambulating.   OBJECTIVE IMPAIRMENTS: Abnormal gait, decreased balance, decreased ROM, decreased strength, decreased safety awareness, dizziness, impaired flexibility, impaired sensation, postural dysfunction, and pain.   ACTIVITY LIMITATIONS: carrying, lifting, bending, standing, squatting, stairs, transfers, bed mobility, continence, bathing, and locomotion level  PARTICIPATION LIMITATIONS: shopping and community activity  PERSONAL FACTORS: Age, Fitness, Time since onset of injury/illness/exacerbation, and 3+ comorbidities: Compression fx L1 2022, CAD, CKD (stage 4), neuropathy, retinopathy  are also affecting patient's functional outcome.   REHAB POTENTIAL: Good  CLINICAL DECISION MAKING: Unstable/unpredictable  EVALUATION COMPLEXITY: High   GOALS: Goals reviewed with patient? Yes  SHORT TERM GOALS: Target date: 07/30/23 Improved TUG to < 25 sec to decrease fall risk.  Baseline: Goal status: IN PROGRESS 09/15/2023  2.  Able to perform 5XSTS without UE assist and controlled descent showing improved LE strength.  Baseline:  Goal status: IN  PROGRESS 09/15/2023  3.  Patient will be able to stand safely for 2 min without UE assist Baseline:  Goal status:IN PROGRESS 09/15/2023  LONG TERM GOALS: Target date: 01/05/2024  Ind with advanced HEP Baseline:  Goal status: IN PROGRESS 09/15/2023  2.  Sit to stand from the recliner with PSFS score of 5 Baseline:  Goal status: revised   3.  Gait with RW from the bedroom to the sunroom 40-50 feet with PSFS of 3 Baseline:  Goal status:revised   4. Improved LE mobility needed for greater ease with putting on socks and shoes in sitting with PSFS of 5 Baseline:  Goal status: revised 5.  Patient will be able to stand long enough to brush teeth PSFS of 2 Baseline:  Goal status: revised   PLAN:  PT FREQUENCY:  2x week  PT DURATION: 8  weeks  PLANNED INTERVENTIONS: 97164- PT Re-evaluation, 97110-Therapeutic exercises, 97530- Therapeutic activity, 97112- Neuromuscular re-education, [phone]- Self Care, 62130- Manual therapy, [phone]- Gait training, [phone]- Aquatic Therapy, [phone]- Electrical stimulation (unattended), Patient/Family education, Balance training, Stair training, Taping, Joint mobilization, Spinal mobilization, DME instructions, Cryotherapy, and Moist heat  PLAN FOR NEXT SESSION: static standing with teal power cord isometric trunk extension;  UE movements while standing for ADLs; gait in // bars working on standing erect, longer step lengths;  assess energy levels and endurance ; functional strengthening; no leg press  Lavinia Sharps, PT 11/17/23 4:54 PM Phone: [phone] Fax: [phone]

## 2023-11-19 ENCOUNTER — Other Ambulatory Visit

## 2023-11-26 ENCOUNTER — Encounter: Admitting: Family Medicine

## 2023-11-26 ENCOUNTER — Other Ambulatory Visit: Payer: Self-pay | Admitting: *Deleted

## 2023-11-26 ENCOUNTER — Other Ambulatory Visit: Payer: Self-pay | Admitting: Family Medicine

## 2023-11-26 ENCOUNTER — Other Ambulatory Visit (INDEPENDENT_AMBULATORY_CARE_PROVIDER_SITE_OTHER)

## 2023-11-26 DIAGNOSIS — R7989 Other specified abnormal findings of blood chemistry: Secondary | ICD-10-CM | POA: Diagnosis not present

## 2023-11-26 DIAGNOSIS — E1169 Type 2 diabetes mellitus with other specified complication: Secondary | ICD-10-CM

## 2023-11-26 DIAGNOSIS — Z794 Long term (current) use of insulin: Secondary | ICD-10-CM | POA: Diagnosis not present

## 2023-11-26 DIAGNOSIS — Z0000 Encounter for general adult medical examination without abnormal findings: Secondary | ICD-10-CM

## 2023-11-26 DIAGNOSIS — E785 Hyperlipidemia, unspecified: Secondary | ICD-10-CM | POA: Diagnosis not present

## 2023-11-26 DIAGNOSIS — E538 Deficiency of other specified B group vitamins: Secondary | ICD-10-CM | POA: Diagnosis not present

## 2023-11-26 DIAGNOSIS — E1142 Type 2 diabetes mellitus with diabetic polyneuropathy: Secondary | ICD-10-CM | POA: Diagnosis not present

## 2023-11-26 LAB — PSA: PSA: 3.96 ng/mL (ref 0.10–4.00)

## 2023-11-26 LAB — LIPID PANEL
Cholesterol: 103 mg/dL (ref 0–200)
HDL: 31.6 mg/dL — ABNORMAL LOW (ref >39.00)
LDL Cholesterol: 53 mg/dL (ref 0–99)
NonHDL: 71.24
Total CHOL/HDL Ratio: 3
Triglycerides: 92 mg/dL (ref 0.0–149.0)
VLDL: 18.4 mg/dL (ref 0.0–40.0)

## 2023-11-26 LAB — COMPREHENSIVE METABOLIC PANEL WITH GFR
ALT: 33 U/L (ref 0–53)
AST: 20 U/L (ref 0–37)
Albumin: 4.2 g/dL (ref 3.5–5.2)
Alkaline Phosphatase: 59 U/L (ref 39–117)
BUN: 51 mg/dL — ABNORMAL HIGH (ref 6–23)
CO2: 25 meq/L (ref 19–32)
Calcium: 8.8 mg/dL (ref 8.4–10.5)
Chloride: 103 meq/L (ref 96–112)
Creatinine, Ser: 2.22 mg/dL — ABNORMAL HIGH (ref 0.40–1.50)
GFR: 29.59 mL/min — ABNORMAL LOW (ref >60.00)
Glucose, Bld: 211 mg/dL — ABNORMAL HIGH (ref 70–99)
Potassium: 4.4 meq/L (ref 3.5–5.1)
Sodium: 137 meq/L (ref 135–145)
Total Bilirubin: 0.4 mg/dL (ref 0.2–1.2)
Total Protein: 6.6 g/dL (ref 6.0–8.3)

## 2023-11-26 LAB — CBC
HCT: 42.1 % (ref 39.0–52.0)
Hemoglobin: 14.3 g/dL (ref 13.0–17.0)
MCHC: 33.9 g/dL (ref 30.0–36.0)
MCV: 95.4 fl (ref 78.0–100.0)
Platelets: 369 10*3/uL (ref 150.0–400.0)
RBC: 4.41 Mil/uL (ref 4.22–5.81)
RDW: 13.4 % (ref 11.5–15.5)
WBC: 8.7 10*3/uL (ref 4.0–10.5)

## 2023-11-26 LAB — VITAMIN D 25 HYDROXY (VIT D DEFICIENCY, FRACTURES): VITD: 26.66 ng/mL — ABNORMAL LOW (ref 30.00–100.00)

## 2023-11-26 LAB — VITAMIN B12: Vitamin B-12: 481 pg/mL (ref 211–911)

## 2023-11-26 LAB — HEMOGLOBIN A1C: Hgb A1c MFr Bld: 7.8 % — ABNORMAL HIGH (ref 4.6–6.5)

## 2023-11-26 LAB — TSH: TSH: 4.1 u[IU]/mL (ref 0.35–5.50)

## 2023-11-30 ENCOUNTER — Ambulatory Visit: Admitting: Family Medicine

## 2023-11-30 ENCOUNTER — Encounter: Payer: Self-pay | Admitting: Family Medicine

## 2023-11-30 VITALS — BP 109/69 | HR 80 | Temp 97.3°F | Ht 67.0 in | Wt 169.0 lb

## 2023-11-30 DIAGNOSIS — E1159 Type 2 diabetes mellitus with other circulatory complications: Secondary | ICD-10-CM | POA: Diagnosis not present

## 2023-11-30 DIAGNOSIS — E559 Vitamin D deficiency, unspecified: Secondary | ICD-10-CM

## 2023-11-30 DIAGNOSIS — L570 Actinic keratosis: Secondary | ICD-10-CM

## 2023-11-30 DIAGNOSIS — E1169 Type 2 diabetes mellitus with other specified complication: Secondary | ICD-10-CM

## 2023-11-30 DIAGNOSIS — E785 Hyperlipidemia, unspecified: Secondary | ICD-10-CM

## 2023-11-30 DIAGNOSIS — Z794 Long term (current) use of insulin: Secondary | ICD-10-CM | POA: Diagnosis not present

## 2023-11-30 DIAGNOSIS — Z0001 Encounter for general adult medical examination with abnormal findings: Secondary | ICD-10-CM

## 2023-11-30 DIAGNOSIS — I152 Hypertension secondary to endocrine disorders: Secondary | ICD-10-CM | POA: Diagnosis not present

## 2023-11-30 DIAGNOSIS — E1142 Type 2 diabetes mellitus with diabetic polyneuropathy: Secondary | ICD-10-CM | POA: Diagnosis not present

## 2023-11-30 MED ORDER — ONETOUCH VERIO VI STRP: ORAL_STRIP | 5 refills | Status: DC

## 2023-11-30 NOTE — Assessment & Plan Note (Signed)
 At goal today on Coreg  3.125 mg twice daily and spironolactone  12.5 mg daily.

## 2023-11-30 NOTE — Assessment & Plan Note (Signed)
 Recent LDL at goal on Lipitor  80 mg daily.

## 2023-11-30 NOTE — Assessment & Plan Note (Signed)
Cryotherapy applied today.  See below procedure note.  He tolerated well. 

## 2023-11-30 NOTE — Progress Notes (Signed)
 Chief Complaint:  Jeffrey [name] is a 68 y.o. male who presents today for his annual comprehensive physical exam.    Assessment/Plan:  Chronic Problems Addressed Today: [name] A1c up slightly to 7.8.  He is no longer using the freestyle libre as it was giving him inaccurate readings and he is now using his One Touch.  This seems to be going well.  Continue his regiment Lantus  44 units daily with NovoLog  as needed.  Recheck A1c in 3 months.  We discussed lifestyle modifications.  Hypertension associated with diabetes ([name]) At goal today on Coreg  3.125 mg twice daily and spironolactone  12.5 mg daily.  Hyperlipidemia associated with type 2 diabetes mellitus ([name]) Recent LDL at goal on Lipitor  80 mg daily.  Actinic keratosis Cryotherapy applied today.  See below procedure note.  He tolerated well.  Vitamin D  deficiency Mildly decreased vitamin D  on recent labs.  Recommend 5000 IUs daily.  Recheck in 3 months.  Preventative Healthcare: Reviewed recent labs which were all stable compared to previous.  He is up-to-date on vaccines.  Due for second shingles vaccine in a couple of months.  Up-to-date on colon cancer screening.  Patient Counseling(The following topics were reviewed and/or handout was given):  -Nutrition: Stressed importance of moderation in sodium/caffeine intake, saturated fat and cholesterol, caloric balance, sufficient intake of fresh fruits, vegetables, and fiber.  -Stressed the importance of regular exercise.   -Substance Abuse: Discussed cessation/primary prevention of tobacco, alcohol, or other drug use; driving or other dangerous activities under the influence; availability of treatment for abuse.   -Injury prevention: Discussed safety belts, safety helmets, smoke detector, smoking near bedding or upholstery.   -Sexuality: Discussed sexually transmitted diseases, partner selection, use of condoms, avoidance of unintended pregnancy and  contraceptive alternatives.   -Dental health: Discussed importance of regular tooth brushing, flossing, and dental visits.  -Health maintenance and immunizations reviewed. Please refer to Health maintenance section.  Return to care in 1 year for next preventative visit.     Subjective:  HPI:  He has no acute complaints today. See Assessment / plan for status of chronic conditions. He had some difficulty with his freestyle libre and stopped using due to inaccurate readings. He is now back to using his one touch and this seems to be going well.   He has a lesion on his face he would like to have his frozen today. This has been present for months.   Lifestyle Diet: Plenty of fruits and vegetables.  Exercise: Working with physical therapy routinely.      11/30/2023    8:53 AM  Depression screen PHQ 2/9  Decreased Interest 0  Down, Depressed, Hopeless 0  PHQ - 2 Score 0    Health Maintenance Due  Topic Date Due   Medicare Annual Wellness (AWV)  Never done   FOOT EXAM  04/23/2022     ROS: Per HPI, otherwise a complete review of systems was negative.   PMH:  The following were reviewed and entered/updated in epic: Past Medical History:  Diagnosis Date   Cardiac arrest ([name]) 05/22/2016   Cataract    CHF (congestive heart failure) ([name])    CKD (chronic kidney disease) stage 3, GFR 30-59 ml/min ([name]) 05/28/2021   CKD (chronic kidney disease) stage 4, GFR 15-29 ml/min ([name]) 05/28/2021   Diabetes mellitus without complication ([name])    type 2   Diabetic retinopathy ([name])    Hyperlipidemia    Hypertension    Memory  loss    mild   Myocardial infarct ([name]) 2105   Retinopathy    Both eyes   S/P primary angioplasty with coronary stent 05/22/2016   Vitamin B12 deficiency    Vitreous hemorrhage of left eye ([name])    proliferative diabetic retinopathy   Patient Active Problem List   Diagnosis Date Noted   Actinic keratosis 11/30/2023   Vitamin D  deficiency 11/30/2023    Rhinitis 05/20/2023   Cervical spondylosis 08/26/2022   CKD (chronic kidney disease) stage 4, GFR 15-29 ml/min ([name]) 05/28/2021   Adjustment disorder 01/22/2021   Chronic back pain 10/22/2020   Peripheral vertigo 05/15/2020   Dermatitis 01/04/2020   Unintentional weight loss with loose stools 05/19/2019   Low vitamin B12 level 03/29/2019   Heme positive stool 03/14/2019   Normocytic anemia 03/14/2019   Chronic diastolic heart failure ([name]) 02/08/2019   Diabetic retinopathy ([name]) 09/08/2018   Physical debility 05/24/2018   Varicose veins of both lower extremities 03/01/2018   Fatigue due to exertion 10/14/2017   GERD (gastroesophageal reflux disease) 08/25/2017   CAD in native artery 04/14/2017   Hyperlipidemia associated with type 2 diabetes mellitus ([name]) 04/14/2017   Diabetic peripheral neuropathy associated with type 2 diabetes mellitus ([name]) 08/17/2016   [name] 05/02/2016   Hypertension associated with diabetes ([name]) 05/02/2016   Past Surgical History:  Procedure Laterality Date   ANGIOPLASTY     2015   COLONOSCOPY     multiple   EYE SURGERY     PARS PLANA VITRECTOMY Left 03/15/2020   Procedure: PARS PLANA VITRECTOMY WITH 25 GAUGE;  Surgeon: Linard Reno, MD;  Location: [facility] OR;  Service: Ophthalmology;  Laterality: Left;   PHOTOCOAGULATION WITH LASER Left 03/15/2020   Procedure: PHOTOCOAGULATION WITH LASER; INTRAVITREAL INJECTION OF AVASTIN ;  Surgeon: Linard Reno, MD;  Location: [facility] OR;  Service: Ophthalmology;  Laterality: Left;   REFRACTIVE SURGERY     UPPER GI ENDOSCOPY     several   VITRECTOMY      Family History  Problem Relation Age of Onset   Alzheimer's disease Mother    Heart disease Father    Peripheral Artery Disease Father    Heart disease Brother    Colon polyps Neg Hx    Prostate cancer Neg Hx    Rectal cancer Neg Hx    Esophageal cancer Neg Hx     Medications- reviewed and updated Current Outpatient Medications   Medication Sig Dispense Refill   acetaminophen  (TYLENOL ) 325 MG tablet Take 325-650 mg by mouth every 6 (six) hours as needed for moderate pain.     aspirin  EC 81 MG tablet Take 81 mg by mouth daily.     atorvastatin  (LIPITOR ) 80 MG tablet TAKE 1 TABLET BY MOUTH EVERY DAY 90 tablet 1   azelastine  (ASTELIN ) 0.1 % nasal spray Place 2 sprays into both nostrils 2 (two) times daily. 30 mL 12   blood glucose meter kit and supplies Dispense based on patient and insurance preference. Use up to four times daily as directed. (FOR ICD-10 E10.9, E11.9). 1 each 0   Blood Glucose Monitoring Suppl (ONETOUCH VERIO IQ SYSTEM) w/Device KIT 1 kit by Does not apply route 4 (four) times daily. 1 kit 0   carvedilol  (COREG ) 3.125 MG tablet TAKE 1 TABLET BY MOUTH 2 TIMES DAILY WITH MEAL 180 tablet 1   Continuous Glucose Receiver (FREESTYLE LIBRE 3 READER) DEVI Use as directed 1 each 0   furosemide  (LASIX ) 40 MG tablet  TAKE 1 TABLET BY MOUTH EVERY DAY 90 tablet 1   insulin  aspart (NOVOLOG ) 100 UNIT/ML injection USE 3X DAILY AS NEEDED FOR HIGH BLOOD SUGARS. 3 UNITS FOR SUGAR >200, 6 UNITS >300, 9 UNITS 400+     insulin  glargine (LANTUS  SOLOSTAR) 100 UNIT/ML Solostar Pen INJECT 34-50 UNITS INTO THE SKIN DAILY. 45 mL 1   Insulin  Pen Needle (PEN NEEDLES) 33G X 4 MM MISC 1 application  by Does not apply route in the morning, at noon, in the evening, and at bedtime. 300 each prn   Insulin  Syringe-Needle U-100 (B-D INS SYR HALF-UNIT .3CC/31G) 31G X 5/16" 0.3 ML MISC Use 6 times a day 200 each 11   ondansetron  (ZOFRAN ) 4 MG tablet Take 1 tablet (4 mg total) by mouth every 8 (eight) hours as needed for nausea or vomiting. 20 tablet 0   OneTouch Delica Lancets 33G MISC Use to check blood sugar 4 times per day 200 each 11   spironolactone  (ALDACTONE ) 25 MG tablet TAKE 1 TABLET (25 MG TOTAL) BY MOUTH DAILY. 90 tablet 0   triamcinolone  cream (KENALOG ) 0.1 % Apply 1 application topically 2 (two) times daily. 454 g 0   glucose blood  (ONETOUCH VERIO) test strip USE TO CHECK BLOOD SUGAR EVERY 3 HOURS AS NEEDED. 300 strip 5   No current facility-administered medications for this visit.    Allergies-reviewed and updated Allergies  Allergen Reactions   Codeine Nausea And Vomiting   Farxiga  [Dapagliflozin ]     Constipation   Gabapentin      Mood changes, depression    Losartan     Penicillins Rash    Did it involve swelling of the face/tongue/throat, SOB, or low BP? no Did it involve sudden or severe rash/hives, skin peeling, or any reaction on the inside of your mouth or nose? yes Did you need to seek medical attention at a hospital or doctor's office? yes When did it last happen?    childhood   If all above answers are "NO", may proceed with cephalosporin use.     Social History   Socioeconomic History   Marital status: Single    Spouse name: Not on file   Number of children: Not on file   Years of education: Not on file   Highest education level: Not on file  Occupational History   Not on file  Tobacco Use   Smoking status: Never   Smokeless tobacco: Never  Vaping Use   Vaping status: Never Used  Substance and Sexual Activity   Alcohol use: Not Currently    Comment: occ   Drug use: No   Sexual activity: Not Currently  Other Topics Concern   Not on file  Social History Narrative   Not on file   Social Drivers of Health   Financial Resource Strain: Not on file  Food Insecurity: No Food Insecurity (02/06/2020)   Hunger Vital Sign    Worried About Running Out of Food in the Last Year: Never true    Ran Out of Food in the Last Year: Never true  Transportation Needs: No Transportation Needs (02/06/2020)   PRAPARE - Administrator, Civil Service (Medical): No    Lack of Transportation (Non-Medical): No  Physical Activity: Not on file  Stress: Not on file  Social Connections: Not on file        Objective:  Physical Exam: BP 109/69   Pulse 80   Temp (!) 97.3 F (36.3 C) (Temporal)    Ht  5\' 7"  (1.702 m)   Wt 169 lb (76.7 kg)   SpO2 95%   BMI 26.47 kg/m   Body mass index is 26.47 kg/m. Wt Readings from Last 3 Encounters:  11/30/23 169 lb (76.7 kg)  09/29/23 170 lb 9.6 oz (77.4 kg)  09/03/23 172 lb (78 kg)   Gen: NAD, resting comfortably HEENT: TMs normal bilaterally. OP clear. No thyromegaly noted.  CV: RRR with no murmurs appreciated Pulm: NWOB, CTAB with no crackles, wheezes, or rhonchi GI: Normal bowel sounds present. Soft, Nontender, Nondistended. MSK: no edema, cyanosis, or clubbing noted Skin: warm, dry. Scattered Aks on face and scalp.  Neuro: CN2-12 grossly intact. Strength 5/5 in upper and lower extremities. Reflexes symmetric and intact bilaterally.  Psych: Normal affect and thought content  Cryotherapy Procedure Note  Pre-operative Diagnosis: Actinic keratosis  Locations: Face and scalp  Indications: Therapeutic  Procedure Details  Patient informed of risks (permanent scarring, infection, light or dark discoloration, bleeding, infection, weakness, numbness and recurrence of the lesion) and benefits of the procedure and verbal informed consent obtained.  The areas are treated with liquid nitrogen therapy, frozen until ice ball extended 2 mm beyond lesion, allowed to thaw, and treated again.  A total of 4 lesions were treated.  The patient tolerated procedure well.  The patient was instructed on post-op care, warned that there may be blister formation, redness and pain. Recommend OTC analgesia as needed for pain.  Condition: Stable  Complications: None.      Jinny Mounts. Daneil Dunker, MD 11/30/2023 9:39 AM

## 2023-11-30 NOTE — Patient Instructions (Signed)
 It was very nice to see you today!  We froze the spot on your face today.  I will refill your diabetic testing strips as well.  No medication changes today.  Will see you back in a year for your annual physical.  Come back in 3 months so that we can recheck your A1c.  Please come back sooner if needed.  Return in about 1 year (around 11/29/2024) for Annual Physical.   Take care, Dr Daneil Dunker  PLEASE NOTE:  If you had any lab tests, please let us  know if you have not heard back within a few days. You may see your results on mychart before we have a chance to review them but we will give you a call once they are reviewed by us .   If we ordered any referrals today, please let us  know if you have not heard from their office within the next week.   If you had any urgent prescriptions sent in today, please check with the pharmacy within an hour of our visit to make sure the prescription was transmitted appropriately.   Please try these tips to maintain a healthy lifestyle:  Eat at least 3 REAL meals and 1-2 snacks per day.  Aim for no more than 5 hours between eating.  If you eat breakfast, please do so within one hour of getting up.   Each meal should contain half fruits/vegetables, one quarter protein, and one quarter carbs (no bigger than a computer mouse)  Cut down on sweet beverages. This includes juice, soda, and sweet tea.   Drink at least 1 glass of water with each meal and aim for at least 8 glasses per day  Exercise at least 150 minutes every week.    Preventive Care 32 Years and Older, Male Preventive care refers to lifestyle choices and visits with your health care provider that can promote health and wellness. Preventive care visits are also called wellness exams. What can I expect for my preventive care visit? Counseling During your preventive care visit, your health care provider may ask about your: Medical history, including: Past medical problems. Family medical  history. History of falls. Current health, including: Emotional well-being. Home life and relationship well-being. Sexual activity. Memory and ability to understand (cognition). Lifestyle, including: Alcohol, nicotine or tobacco, and drug use. Access to firearms. Diet, exercise, and sleep habits. Work and work Astronomer. Sunscreen use. Safety issues such as seatbelt and bike helmet use. Physical exam Your health care provider will check your: Height and weight. These may be used to calculate your BMI (body mass index). BMI is a measurement that tells if you are at a healthy weight. Waist circumference. This measures the distance around your waistline. This measurement also tells if you are at a healthy weight and may help predict your risk of certain diseases, such as type 2 diabetes and high blood pressure. Heart rate and blood pressure. Body temperature. Skin for abnormal spots. What immunizations do I need?  Vaccines are usually given at various ages, according to a schedule. Your health care provider will recommend vaccines for you based on your age, medical history, and lifestyle or other factors, such as travel or where you work. What tests do I need? Screening Your health care provider may recommend screening tests for certain conditions. This may include: Lipid and cholesterol levels. Diabetes screening. This is done by checking your blood sugar (glucose) after you have not eaten for a while (fasting). Hepatitis C test. Hepatitis B test. HIV (  human immunodeficiency virus) test. STI (sexually transmitted infection) testing, if you are at risk. Lung cancer screening. Colorectal cancer screening. Prostate cancer screening. Abdominal aortic aneurysm (AAA) screening. You may need this if you are a current or former smoker. Talk with your health care provider about your test results, treatment options, and if necessary, the need for more tests. Follow these instructions at  home: Eating and drinking  Eat a diet that includes fresh fruits and vegetables, whole grains, lean protein, and low-fat dairy products. Limit your intake of foods with high amounts of sugar, saturated fats, and salt. Take vitamin and mineral supplements as recommended by your health care provider. Do not drink alcohol if your health care provider tells you not to drink. If you drink alcohol: Limit how much you have to 0-2 drinks a day. Know how much alcohol is in your drink. In the [place] one drink equals one 12 oz bottle of beer (355 mL), one 5 oz glass of wine (148 mL), or one 1 oz glass of hard liquor (44 mL). Lifestyle Brush your teeth every morning and night with fluoride toothpaste. Floss one time each day. Exercise for at least 30 minutes 5 or more days each week. Do not use any products that contain nicotine or tobacco. These products include cigarettes, chewing tobacco, and vaping devices, such as e-cigarettes. If you need help quitting, ask your health care provider. Do not use drugs. If you are sexually active, practice safe sex. Use a condom or other form of protection to prevent STIs. Take aspirin  only as told by your health care provider. Make sure that you understand how much to take and what form to take. Work with your health care provider to find out whether it is safe and beneficial for you to take aspirin  daily. Ask your health care provider if you need to take a cholesterol-lowering medicine (statin). Find healthy ways to manage stress, such as: Meditation, yoga, or listening to music. Journaling. Talking to a trusted person. Spending time with friends and family. Safety Always wear your seat belt while driving or riding in a vehicle. Do not drive: If you have been drinking alcohol. Do not ride with someone who has been drinking. When you are tired or distracted. While texting. If you have been using any mind-altering substances or drugs. Wear a helmet and other  protective equipment during sports activities. If you have firearms in your house, make sure you follow all gun safety procedures. Minimize exposure to UV radiation to reduce your risk of skin cancer. What's next? Visit your health care provider once a year for an annual wellness visit. Ask your health care provider how often you should have your eyes and teeth checked. Stay up to date on all vaccines. This information is not intended to replace advice given to you by your health care provider. Make sure you discuss any questions you have with your health care provider. Document Revised: 01/23/2021 Document Reviewed: 01/23/2021 Elsevier Patient Education  2024 [employer].

## 2023-11-30 NOTE — Assessment & Plan Note (Signed)
 Mildly decreased vitamin D  on recent labs.  Recommend 5000 IUs daily.  Recheck in 3 months.

## 2023-11-30 NOTE — Assessment & Plan Note (Signed)
 A1c up slightly to 7.8.  He is no longer using the freestyle libre as it was giving him inaccurate readings and he is now using his One Touch.  This seems to be going well.  Continue his regiment Lantus  44 units daily with NovoLog  as needed.  Recheck A1c in 3 months.  We discussed lifestyle modifications.

## 2023-12-01 ENCOUNTER — Ambulatory Visit: Admitting: Physical Therapy

## 2023-12-01 DIAGNOSIS — M6281 Muscle weakness (generalized): Secondary | ICD-10-CM

## 2023-12-01 DIAGNOSIS — R2689 Other abnormalities of gait and mobility: Secondary | ICD-10-CM

## 2023-12-01 DIAGNOSIS — R262 Difficulty in walking, not elsewhere classified: Secondary | ICD-10-CM

## 2023-12-01 NOTE — Therapy (Signed)
 OUTPATIENT PHYSICAL THERAPY LOWER EXTREMITY TREATMENT  Patient Name: Jeffrey [name] MRN: [id number] DOB:1956-04-17, 68 y.o., male Today's Date: 12/01/2023    END OF SESSION:  PT End of Session - 12/01/23 1607     Visit Number 24    Date for PT Re-Evaluation 01/05/24    Authorization Type HTA    Progress Note Due on Visit 30    PT Start Time 1608    PT Stop Time 1655    PT Time Calculation (min) 47 min    Activity Tolerance Patient tolerated treatment well                      Past Medical History:  Diagnosis Date   Cardiac arrest ([name]) 05/22/2016   Cataract    CHF (congestive heart failure) ([name])    CKD (chronic kidney disease) stage 3, GFR 30-59 ml/min ([name]) 05/28/2021   CKD (chronic kidney disease) stage 4, GFR 15-29 ml/min ([name]) 05/28/2021   Diabetes mellitus without complication ([name])    type 2   Diabetic retinopathy ([name])    Hyperlipidemia    Hypertension    Memory loss    mild   Myocardial infarct ([name]) 2105   Retinopathy    Both eyes   S/P primary angioplasty with coronary stent 05/22/2016   Vitamin B12 deficiency    Vitreous hemorrhage of left eye ([name])    proliferative diabetic retinopathy   Past Surgical History:  Procedure Laterality Date   ANGIOPLASTY     2015   COLONOSCOPY     multiple   EYE SURGERY     PARS PLANA VITRECTOMY Left 03/15/2020   Procedure: PARS PLANA VITRECTOMY WITH 25 GAUGE;  Surgeon: Linard Reno, MD;  Location: [facility] OR;  Service: Ophthalmology;  Laterality: Left;   PHOTOCOAGULATION WITH LASER Left 03/15/2020   Procedure: PHOTOCOAGULATION WITH LASER; INTRAVITREAL INJECTION OF AVASTIN ;  Surgeon: Linard Reno, MD;  Location: [facility] OR;  Service: Ophthalmology;  Laterality: Left;   REFRACTIVE SURGERY     UPPER GI ENDOSCOPY     several   VITRECTOMY     Patient Active Problem List   Diagnosis Date Noted   Actinic keratosis 11/30/2023   Vitamin D  deficiency 11/30/2023   Rhinitis 05/20/2023   Cervical spondylosis 08/26/2022    CKD (chronic kidney disease) stage 4, GFR 15-29 ml/min ([name]) 05/28/2021   Adjustment disorder 01/22/2021   Chronic back pain 10/22/2020   Peripheral vertigo 05/15/2020   Dermatitis 01/04/2020   Unintentional weight loss with loose stools 05/19/2019   Low vitamin B12 level 03/29/2019   Heme positive stool 03/14/2019   Normocytic anemia 03/14/2019   Chronic diastolic heart failure ([name]) 02/08/2019   Diabetic retinopathy ([name]) 09/08/2018   Physical debility 05/24/2018   Varicose veins of both lower extremities 03/01/2018   Fatigue due to exertion 10/14/2017   GERD (gastroesophageal reflux disease) 08/25/2017   CAD in native artery 04/14/2017   Hyperlipidemia associated with type 2 diabetes mellitus ([name]) 04/14/2017   Diabetic peripheral neuropathy associated with type 2 diabetes mellitus ([name]) 08/17/2016   [name] 05/02/2016   Hypertension associated with diabetes ([name]) 05/02/2016    PCP: Rodney Clamp, MD   REFERRING PROVIDER: Rodney Clamp, MD   REFERRING DIAG: R53.81 (ICD-10-CM) - Physical debility   THERAPY DIAG:  Muscle weakness (generalized)  Other abnormalities of gait and mobility  Difficulty in walking, not elsewhere classified  Rationale for Evaluation and Treatment: Rehabilitation  ONSET DATE: 2022  SUBJECTIVE:   SUBJECTIVE STATEMENT: Arrives in transport chair.  States he was sick all last week/in the bed due to allergies.   From eval: Patient has been to two different therapy places and not had good experiences. One at [facility], one private practice. I want to be able to walk without the walker and I'm having trouble getting out of chairs without the walker. Presently sits in a dining chair. Can't sit in his recliner in his bedroom anymore. Wants to work on balance also. Joined [employer] which has a track and an elevator (on pause right now due to disability). Periodically legs shake when he is walking.   Brother: [name]  identical twins  PERTINENT HISTORY: 3/11: evidence of lumbar compression deformity Compression fx L1 2022, CAD, CKD (stage 4), DM, cardiac arrest 2017, neuropathy, retinopathy Long history of gait abnormality beginning back in 2022. He has a history of CAD and believes that his debility began following a covid infection. In December odf 2022 he fell and suffered a compression fracture of L1.  PAIN:  Are you having pain? No  PRECAUTIONS: Fall  RED FLAGS: None   WEIGHT BEARING RESTRICTIONS: No  FALLS:  Has patient fallen in last 6 months? No and with sit to stand falls back sometimes  LIVING ENVIRONMENT: Lives with: lives with their family Lives in: House/apartment Stairs: No Has following equipment at home: Single point cane, Environmental consultant - 2 wheeled, Environmental consultant - 4 wheeled, shower chair, Grab bars, and stand up walker also; also has shower chair at sink for shaving, brushing teeth etc, also has transporter.  OCCUPATION: retired  PLOF: Independent with household mobility with device  PATIENT GOALS: to walk without a walker  NEXT MD VISIT: 09/28/22  OBJECTIVE:  Note: Objective measures were completed at Evaluation unless otherwise noted.  DIAGNOSTIC FINDINGS: N/A  COGNITION: Overall cognitive status: Within functional limits for tasks assessed     SENSATION: Neuropathy in B feet   MUSCLE LENGTH: Marked HS R>L, hip flexor tightness   POSTURE: rounded shoulders and forward head  LOWER EXTREMITY ROM: WFL for tasks assessed  LOWER EXTREMITY MMT: *tested in hooklying  MMT Right eval Left eval 4/1   Hip flexion 4 4 4  Bil  Hip extension Able to bridge Able to bridge 4- Bil  Hip abduction 4-* 4-* 4- Bil  Hip adduction     Hip internal rotation     Hip external rotation     Knee flexion     Knee extension 4 4+ 4- Bil  Ankle dorsiflexion 4+ 4- 4- Bil  Ankle plantarflexion     Ankle inversion     Ankle eversion      (Blank rows = not tested)  3/20:  max UE assist to  rise sit to stand; LE hip extension, knee extension grossly 3+ to 4-/5  FUNCTIONAL TESTS:  30 seconds chair stand test Timed up and go (TUG): 59.36 with 4WW Stance time: 45 sec patient reports unsteadiness and feels he goes backwards 07/27/2023 92 ft decreased cadence; decreased step length; absent heel strike  12/26: 5x STS: unable to rise without heavy UE use; attempted to use armrests for test but does not come all the way up;  next trial with chair +cushion to touch at least one RW handle 48.2 sec:   TUG: cushion in seat +arm use and RW:  52.49 sec  08/13/23 attempted 5x STS but pt unable to complete: 1st rep does not come all the way up and then  plops heavily into the transport wheelchair (he then recounts how this happened at home and how the chair tipped backwards) unable to complete reps Unable to complete TUG today, he does not have his 4 wheeled walker today and feels he is slower with the clinic RW  09/15/2023 TUG: 1:03 with rollator 5STS:48.55 sec heavy reliance on UE support; last repetition was a challenge  3/20: Heavy UE reliance with STS (unable to push up from [facility] armrests, pulls up from // bars) 4/1: max UE assist with sit to stand  GAIT: Distance walked: 25 Assistive device utilized: Walker - 4 wheeled Level of assistance: Modified independence Comments: slow speed, decreased step and stride, decreased heel strike, forward trunk lean.  TRANSFERS: Sit to stand: Uses walker to pull up; stand to sit: does not fully back up to chair and does not reach back; Supine to sit: needs min A for log roll  (has some back pain with sit <>supine due to sitting upward from supine) also fear of falling from bed, sit to supine needs min A to lift legs on to mat table The Patient-Specific Functional Scale  Initial:  I am going to ask you to identify up to 3 important activities that you are unable to do or are having difficulty with as a result of this problem.  Today are there any  activities that you are unable to do or having difficulty with because of this?  (Patient shown scale and patient rated each activity)  Follow up: When you first came in you had difficulty performing these activities.  Today do you still have difficulty?  Patient-Specific activity scoring scheme (Point to one number):  0 1 2 3 4 5 6 7 8 9  10 Unable                                                                                                          Able to perform To perform                                                                                                    activity at the same Activity         Level as before  Injury or problem  Activity            Getting out of the recliner                                                                     Initial:       3                 2.                   Walking with RW 40 feet                                                                Initial:           1           3.                          Standing to brush teeth                                                           Initial:       0 (has to do sitting on shower chair)                 4. Putting on socks and shoes  while sitting                                                      initial 2-3  TODAY'S TREATMENT:                                                                                                                              DATE:   12/01/23: Gait belt on for safety Standing in // bars with multi plane toe taps alternating Standing within // bars: teal 35# power cord around upper trunk (anchored by therapist) isometric hold of standing tall 30 sec x2 Static standing UE reaching to therapist hands in multiple planes to simulate brushing teeth and performing other ADLS at the sink Neuromuscular re-ed dynamic balance with  balloon taps single and  double UE use 3 rounds of walking and standing in // bars: approx 6-8 min each round 2 inch forward step ups 1 sets of 5 (2nd set with single arm support) cues in // bars to encourage longer step lengths 4 laps Foot prop on 2 inch step with UE reaches multi planes and head turns  Pt able to reach down to put down WC foot rests and put feet on rests   11/17/23: Gait with 4 wheeled RW 25 feet with decreased gait speed, heavy reliance on Ues Standing within // bars: teal 35# power cord around upper trunk (anchored by therapist) isometric hold of standing tall 60 sec x2 Static standing UE reaching to therapist hands in multiple planes to simulate brushing teeth and performing other ADLS at the sink 3 rounds of walking and standing in // bars: approx 6 min each round 2 inch forward step ups 2 sets of 5 (2nd set with single arm support) Stepping stones/circles in // bars to encourage longer step lengths 4 laps Seated 2# bicep curls 10x right/left Seated 2# overhead press 10x right/left Gait with RW 20 feet to lobby, seated rest 2 min Gait with RW from lobby, down outside ramp to the car CGA; transport chair following behind in case of over fatigue but not used Encouraged regular walking at home with RW with brother assisting for safety 11/12/23: Gait with 4 wheeled RW 25 feet with decreased gait speed, heavy reliance on UEs 3 rounds of walking and standing in // bars: approx 6 min each round Standing in // bars: alternating toe taps to foam Standing with alternating UE reaches 10x Foot prop on foam with head turns/arm reaches // bars: 6 laps with CGA with gait belt, 3 laps without railing assist; 3 laps with single side support working on arm swing and longer step lengths Used metronome 58 setting to increase gait speed in // bars  Seated 3# LAQ 10x right/left Seated 3# hip flexion up and over line 10x right/left Static standing within // bars no touch (moderate challenge to  balance)  11/10/23: PSFS Discussed bringing in RW to measure gait distance (still come by transport to conserve energy) 3 rounds of walking and standing in // bars: round 1: 6:41, round 2: 9:21, round 3: 4:15 Standing in // bars: alternating toe taps Standing with alternating UE reaches 10x // bars: 6 laps with CGA with gait belt, 3 laps without railing assist; 3 laps with single side support working on arm swing Stepping over dowels on floor in // bars to encourage toe clearance and longer steps Mini squats in parallel bars heavy UE assist 10x Backwards walk in // bars 2 lap 2 min rest breaks between each round Seated in transport chair lat pulls 7# on each side 12x  RPE 9/10 end of session "I'm a lot more tired today"  10/28/23: Standing in // bars: alternating lateral toe taps Standing with alternating UE reaches 10x // bars: 6 laps with CGA with gait belt, 8 laps without railing assist Mini squats in parallel bars heavy UE assist Standing reach down single side 5x right/left  Backwards walk in // bars 3 lap Walking in // bars with mirror to provide postural feedback Foot prop on 4 inch step with UE reach 10x Standing in // bars with UE push down on peanut ball 10x Standing in // bars with peanut roll forward and back on bars Attempted sit to stand pushing up from [facility] armrests (  unable to complete needs heavy assist on // bars to rise) 4 inch forward step ups 4x right/left with heavy UE assist on // bars 2 min rest breaks 2x  RPE 6/10 end of session    PATIENT EDUCATION:  Education details: PT eval findings, anticipated POC, initial HEP, postural awareness, and transfer safety  Person educated: Patient and brother Education method: Explanation, Demonstration, and Handouts Education comprehension: verbalized understanding and returned demonstration  HOME EXERCISE PROGRAM: Access Code: [email or web address] URL: [email or web address] Date: 09/15/2023 Prepared by: Penelope Bowie  Exercises - Sit to Stand with Counter Support  - 3 x daily - 3-4 x weekly - 1 sets - 1-5 reps - Hooklying Clamshell with Resistance  - 2 x daily - 3-4 x weekly - 1 sets - 10 reps - Seated Long Arc Quad  - 1 x daily - 7 x weekly - 1-3 sets - 10 reps - 5 sec hold - Standing Hip Abduction with Counter Support  - 1 x daily - 7 x weekly - 1-2 sets - 10 reps - Standing March with Counter Support  - 1 x daily - 7 x weekly - 1-2 sets - 10 reps - Standing Heel Raise with Support  - 1 x daily - 7 x weekly - 1-2 sets - 10 reps - Shoulder External Rotation and Scapular Retraction with Resistance  - 1 x daily - 7 x weekly - 2 sets - 10 reps - Sit to Stand with Armchair  - 1 x daily - 7 x weekly - 1 sets - 5 reps  Patient Education - Tips to reduce freezing episodes with standing or walking   ASSESSMENT:  CLINICAL IMPRESSION: Pt reports he was unable to do any walking or ex's last week due to being sick.  Today he is able to return to standing ex's and dynamic gait ex's with 6-8 minute rounds of being on his feet with short 1-2 minute seated rest breaks in between.  CGA with gait belt for safety.  Able to reach with both arms during dynamic balance challenges for short periods of time.  He lacks LE strength to control descent with sitting down and continues to rely on heavy UE assist with sit to stand.   OBJECTIVE IMPAIRMENTS: Abnormal gait, decreased balance, decreased ROM, decreased strength, decreased safety awareness, dizziness, impaired flexibility, impaired sensation, postural dysfunction, and pain.   ACTIVITY LIMITATIONS: carrying, lifting, bending, standing, squatting, stairs, transfers, bed mobility, continence, bathing, and locomotion level  PARTICIPATION LIMITATIONS: shopping and community activity  PERSONAL FACTORS: Age, Fitness, Time since onset of injury/illness/exacerbation, and 3+ comorbidities: Compression fx L1 2022, CAD, CKD (stage 4), neuropathy, retinopathy  are also  affecting patient's functional outcome.   REHAB POTENTIAL: Good  CLINICAL DECISION MAKING: Unstable/unpredictable  EVALUATION COMPLEXITY: High   GOALS: Goals reviewed with patient? Yes  SHORT TERM GOALS: Target date: 07/30/23 Improved TUG to < 25 sec to decrease fall risk.  Baseline: Goal status: IN PROGRESS 09/15/2023  2.  Able to perform 5XSTS without UE assist and controlled descent showing improved LE strength.  Baseline:  Goal status: IN PROGRESS 09/15/2023  3.  Patient will be able to stand safely for 2 min without UE assist Baseline:  Goal status:IN PROGRESS 09/15/2023  LONG TERM GOALS: Target date: 01/05/2024  Ind with advanced HEP Baseline:  Goal status: IN PROGRESS 09/15/2023  2.  Sit to stand from the recliner with PSFS score of 5 Baseline:  Goal status: revised   3.  Gait with RW  from the bedroom to the sunroom 40-50 feet with PSFS of 3 Baseline:  Goal status:revised   4. Improved LE mobility needed for greater ease with putting on socks and shoes in sitting with PSFS of 5 Baseline:  Goal status: revised 5.  Patient will be able to stand long enough to brush teeth PSFS of 2 Baseline:  Goal status: revised   PLAN:  PT FREQUENCY:  2x week  PT DURATION: 8 weeks  PLANNED INTERVENTIONS: 97164- PT Re-evaluation, 97110-Therapeutic exercises, 97530- Therapeutic activity, 97112- Neuromuscular re-education, 97535- Self Care, 81191- Manual therapy, [phone]- Gait training, [phone]- Aquatic Therapy, 97014- Electrical stimulation (unattended), Patient/Family education, Balance training, Stair training, Taping, Joint mobilization, Spinal mobilization, DME instructions, Cryotherapy, and Moist heat  PLAN FOR NEXT SESSION: static standing with teal power cord isometric trunk extension;  UE movements while standing for ADLs; gait in // bars working on standing erect, longer step lengths;  assess energy levels and endurance ; functional strengthening; no leg press  Darien Eden,  PT 12/01/23 5:05 PM Phone: [phone] Fax: [phone]

## 2023-12-03 ENCOUNTER — Encounter: Payer: Self-pay | Admitting: Family Medicine

## 2023-12-03 ENCOUNTER — Ambulatory Visit: Admitting: Physical Therapy

## 2023-12-03 NOTE — Telephone Encounter (Signed)
**Note De-identified  [place] Obfuscation** Please advise 

## 2023-12-03 NOTE — Telephone Encounter (Signed)
 Ok to send order.  Jinny Mounts. Daneil Dunker, MD 12/03/2023 12:38 PM

## 2023-12-04 ENCOUNTER — Other Ambulatory Visit: Payer: Self-pay | Admitting: *Deleted

## 2023-12-04 DIAGNOSIS — E1142 Type 2 diabetes mellitus with diabetic polyneuropathy: Secondary | ICD-10-CM

## 2023-12-04 NOTE — Telephone Encounter (Signed)
 DME order faxed to #Fax: [phone] [address] [place], [place] [address] Get Directions  Phone: [phone]

## 2023-12-08 ENCOUNTER — Ambulatory Visit: Admitting: Physical Therapy

## 2023-12-08 DIAGNOSIS — M6281 Muscle weakness (generalized): Secondary | ICD-10-CM | POA: Diagnosis not present

## 2023-12-08 DIAGNOSIS — R262 Difficulty in walking, not elsewhere classified: Secondary | ICD-10-CM

## 2023-12-08 DIAGNOSIS — R2689 Other abnormalities of gait and mobility: Secondary | ICD-10-CM

## 2023-12-08 NOTE — Therapy (Signed)
 OUTPATIENT PHYSICAL THERAPY LOWER EXTREMITY TREATMENT  Patient Name: Jeffrey [name] MRN: [id number] DOB:[birth year], 68 y.o., male Today's Date: 12/08/2023    END OF SESSION:  PT End of Session - 12/08/23 1358     Visit Number 25    Date for PT Re-Evaluation 01/05/24    Authorization Type HTA    Progress Note Due on Visit 30    PT Start Time 1400    PT Stop Time 1443    PT Time Calculation (min) 43 min    Equipment Utilized During Treatment Gait belt    Activity Tolerance Patient tolerated treatment [name]                      Past Medical History:  Diagnosis Date   Cardiac arrest ([name]) 05/22/2016   Cataract    CHF (congestive heart failure) ([name])    CKD (chronic kidney disease) stage 3, GFR 30-59 ml/min ([name]) 05/28/2021   CKD (chronic kidney disease) stage 4, GFR 15-29 ml/min ([name]) 05/28/2021   Diabetes mellitus without complication ([name])    type 2   Diabetic retinopathy ([name])    Hyperlipidemia    Hypertension    Memory loss    mild   Myocardial infarct ([name]) 2105   Retinopathy    Both eyes   S/P primary angioplasty with coronary stent 05/22/2016   Vitamin B12 deficiency    Vitreous hemorrhage of left eye ([name])    proliferative diabetic retinopathy   Past Surgical History:  Procedure Laterality Date   ANGIOPLASTY     2015   COLONOSCOPY     multiple   EYE SURGERY     PARS PLANA VITRECTOMY Left 03/15/2020   Procedure: PARS PLANA VITRECTOMY WITH 25 GAUGE;  Surgeon: Linard Reno, MD;  Location: [facility] OR;  Service: Ophthalmology;  Laterality: Left;   PHOTOCOAGULATION WITH LASER Left 03/15/2020   Procedure: PHOTOCOAGULATION WITH LASER; INTRAVITREAL INJECTION OF AVASTIN ;  Surgeon: Linard Reno, MD;  Location: [facility] OR;  Service: Ophthalmology;  Laterality: Left;   REFRACTIVE SURGERY     UPPER GI ENDOSCOPY     several   VITRECTOMY     Patient Active Problem List   Diagnosis Date Noted   Actinic keratosis 11/30/2023   Vitamin D  deficiency 11/30/2023    Rhinitis 05/20/2023   Cervical spondylosis 08/26/2022   CKD (chronic kidney disease) stage 4, GFR 15-29 ml/min ([name]) 05/28/2021   Adjustment disorder 01/22/2021   Chronic back pain 10/22/2020   Peripheral vertigo 05/15/2020   Dermatitis 01/04/2020   Unintentional weight loss with loose stools 05/19/2019   Low vitamin B12 level 03/29/2019   Heme positive stool 03/14/2019   Normocytic anemia 03/14/2019   Chronic diastolic heart failure ([name]) 02/08/2019   Diabetic retinopathy ([name]) 09/08/2018   Physical debility 05/24/2018   Varicose veins of both lower extremities 03/01/2018   Fatigue due to exertion 10/14/2017   GERD (gastroesophageal reflux disease) 08/25/2017   CAD in native artery 04/14/2017   Hyperlipidemia associated with type 2 diabetes mellitus ([name]) 04/14/2017   Diabetic peripheral neuropathy associated with type 2 diabetes mellitus ([name]) 08/17/2016   [name] 05/02/2016   Hypertension associated with diabetes ([name]) 05/02/2016    PCP: Rodney Clamp, MD   REFERRING PROVIDER: Rodney Clamp, MD   REFERRING DIAG: R53.81 (ICD-10-CM) - Physical debility   THERAPY DIAG:  Muscle weakness (generalized)  Other abnormalities of gait and mobility  Difficulty in walking, not elsewhere classified  Rationale for  Evaluation and Treatment: Rehabilitation  ONSET DATE: 2022  SUBJECTIVE:   SUBJECTIVE STATEMENT: Arrives with rollator walker.  States he hasn't been doing anything at home so his stamina is not good today. Brother: [name] identical twins  PERTINENT HISTORY: 3/11: evidence of lumbar compression deformity Compression fx L1 2022, CAD, CKD (stage 4), DM, cardiac arrest 2017, neuropathy, retinopathy Long history of gait abnormality beginning back in 2022. He has a history of CAD and believes that his debility began following a covid infection. In December odf 2022 he fell and suffered a compression fracture of L1.  PAIN:  Are you having  pain? No  PRECAUTIONS: Fall  RED FLAGS: None   WEIGHT BEARING RESTRICTIONS: No  FALLS:  Has patient fallen in last 6 months? No and with sit to stand falls back sometimes  LIVING ENVIRONMENT: Lives with: lives with their family Lives in: House/apartment Stairs: No Has following equipment at home: Single point cane, Environmental consultant - 2 wheeled, Environmental consultant - 4 wheeled, shower chair, Grab bars, and stand up walker also; also has shower chair at sink for shaving, brushing teeth etc, also has transporter.  OCCUPATION: retired  PLOF: Independent with household mobility with device  PATIENT GOALS: to walk without a walker  NEXT MD VISIT: 09/28/22  OBJECTIVE:  Note: Objective measures were completed at Evaluation unless otherwise noted.  DIAGNOSTIC FINDINGS: N/A  COGNITION: Overall cognitive status: Within functional limits for tasks assessed     SENSATION: Neuropathy in B feet   MUSCLE LENGTH: Marked HS R>L, hip flexor tightness   POSTURE: rounded shoulders and forward head  LOWER EXTREMITY ROM: WFL for tasks assessed  LOWER EXTREMITY MMT: *tested in hooklying  MMT Right eval Left eval 4/1   Hip flexion 4 4 4  Bil  Hip extension Able to bridge Able to bridge 4- Bil  Hip abduction 4-* 4-* 4- Bil  Hip adduction     Hip internal rotation     Hip external rotation     Knee flexion     Knee extension 4 4+ 4- Bil  Ankle dorsiflexion 4+ 4- 4- Bil  Ankle plantarflexion     Ankle inversion     Ankle eversion      (Blank rows = not tested)  3/20:  max UE assist to rise sit to stand; LE hip extension, knee extension grossly 3+ to 4-/5  FUNCTIONAL TESTS:  30 seconds chair stand test Timed up and go (TUG): 59.36 with 4WW Stance time: 45 sec patient reports unsteadiness and feels he goes backwards 07/27/2023 92 ft decreased cadence; decreased step length; absent heel strike  12/26: 5x STS: unable to rise without heavy UE use; attempted to use armrests for test but does not  come all the way up;  next trial with chair +cushion to touch at least one RW handle 48.2 sec:   TUG: cushion in seat +arm use and RW:  52.49 sec  08/13/23 attempted 5x STS but pt unable to complete: 1st rep does not come all the way up and then plops heavily into the transport wheelchair (he then recounts how this happened at home and how the chair tipped backwards) unable to complete reps Unable to complete TUG today, he does not have his 4 wheeled walker today and feels he is slower with the clinic RW  09/15/2023 TUG: 1:03 with rollator 5STS:48.55 sec heavy reliance on UE support; last repetition was a challenge  3/20: Heavy UE reliance with STS (unable to push up from [facility] armrests,  pulls up from // bars) 4/1: max UE assist with sit to stand  GAIT: Distance walked: 25 Assistive device utilized: Walker - 4 wheeled Level of assistance: Modified independence Comments: slow speed, decreased step and stride, decreased heel strike, forward trunk lean.  TRANSFERS: Sit to stand: Uses walker to pull up; stand to sit: does not fully back up to chair and does not reach back; Supine to sit: needs min A for log roll  (has some back pain with sit <>supine due to sitting upward from supine) also fear of falling from bed, sit to supine needs min A to lift legs on to mat table The Patient-Specific Functional Scale  Initial:  I am going to ask you to identify up to 3 important activities that you are unable to do or are having difficulty with as a result of this problem.  Today are there any activities that you are unable to do or having difficulty with because of this?  (Patient shown scale and patient rated each activity)  Follow up: When you first came in you had difficulty performing these activities.  Today do you still have difficulty?  Patient-Specific activity scoring scheme (Point to one number):  0 1 2 3 4 5 6 7 8 9  10 Unable                                                                                                           Able to perform To perform                                                                                                    activity at the same Activity         Level as before                                                                                                                       Injury or problem  Activity            Getting out of the recliner  Initial:       3                 2.                   Walking with RW 40 feet                                                                Initial:           1           3.                          Standing to brush teeth                                                           Initial:       0 (has to do sitting on shower chair)                 4. Putting on socks and shoes  while sitting                                                      initial 2-3  TODAY'S TREATMENT:        12/08/23: Gait belt on for safety 1st round standing exercises and walking in // bars 9.5 minutes, 2nd round 5 minutes, 3rd round 3 minutes:  Standing in // bars with multi plane and weight shifting  and UE reaching Static standing bicep curls 2# 10x right and left (single side, holding on with other hand) to simulate brushing teeth and performing other ADLS at the sink Seated holding pair of 2# weights chops 7x each way Seated holding pair of 2# weights at shoulder level with marching 7x 2 inch forward step ups 1 sets of 5 (2nd set with single arm support) cues in // bars to encourage longer step lengths 4 laps Discussed with pt and his brother options for home ex's to build stamina (pt has a HEP and light weights at home); also discussed [employer] option for chair ex's (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 for him) Patient too fatigued to walk out of the clinic with rollator, used transport chair to car                   DATE:    12/01/23: Gait belt on for safety Standing in // bars with multi plane toe taps alternating Standing within // bars: teal 35# power cord around upper trunk (anchored by therapist) isometric hold of standing tall 30 sec x2 Static standing UE reaching to therapist hands in multiple planes to simulate brushing teeth and performing other ADLS at the sink Neuromuscular re-ed dynamic balance with balloon taps single and double UE use 3 rounds of walking  and standing in // bars: approx 6-8 min each round 2 inch forward step ups 1 sets of 5 (2nd set with single arm support) cues in // bars to encourage longer step lengths 4 laps Foot prop on 2 inch step with UE reaches multi planes and head turns  Pt able to reach down to put down WC foot rests and put feet on rests   11/17/23: Gait with 4 wheeled RW 25 feet with decreased gait speed, heavy reliance on Ues Standing within // bars: teal 35# power cord around upper trunk (anchored by therapist) isometric hold of standing tall 60 sec x2 Static standing UE reaching to therapist hands in multiple planes to simulate brushing teeth and performing other ADLS at the sink 3 rounds of walking and standing in // bars: approx 6 min each round 2 inch forward step ups 2 sets of 5 (2nd set with single arm support) Stepping stones/circles in // bars to encourage longer step lengths 4 laps Seated 2# bicep curls 10x right/left Seated 2# overhead press 10x right/left Gait with RW 20 feet to lobby, seated rest 2 min Gait with RW from lobby, down outside ramp to the car CGA; transport chair following behind in case of over fatigue but not used Encouraged regular walking at home with RW with brother assisting for safety 11/12/23: Gait with 4 wheeled RW 25 feet with decreased gait speed, heavy reliance on UEs 3 rounds of walking and standing in // bars: approx 6 min each round Standing in // bars: alternating toe taps to foam Standing with alternating UE reaches 10x Foot  prop on foam with head turns/arm reaches // bars: 6 laps with CGA with gait belt, 3 laps without railing assist; 3 laps with single side support working on arm swing and longer step lengths Used metronome 58 setting to increase gait speed in // bars  Seated 3# LAQ 10x right/left Seated 3# hip flexion up and over line 10x right/left Static standing within // bars no touch (moderate challenge to balance)  11/10/23: PSFS Discussed bringing in RW to measure gait distance (still come by transport to conserve energy) 3 rounds of walking and standing in // bars: round 1: 6:41, round 2: 9:21, round 3: 4:15 Standing in // bars: alternating toe taps Standing with alternating UE reaches 10x // bars: 6 laps with CGA with gait belt, 3 laps without railing assist; 3 laps with single side support working on arm swing Stepping over dowels on floor in // bars to encourage toe clearance and longer steps Mini squats in parallel bars heavy UE assist 10x Backwards walk in // bars 2 lap 2 min rest breaks between each round Seated in transport chair lat pulls 7# on each side 12x  RPE 9/10 end of session "I'm a lot more tired today" PATIENT EDUCATION:  Education details: PT eval findings, anticipated POC, initial HEP, postural awareness, and transfer safety  Person educated: Patient and brother Education method: Explanation, Demonstration, and Handouts Education comprehension: verbalized understanding and returned demonstration  HOME EXERCISE PROGRAM: Access Code: [email or web address] URL: [email or web address] Date: 09/15/2023 Prepared by: Penelope Bowie  Exercises - Sit to Stand with Counter Support  - 3 x daily - 3-4 x weekly - 1 sets - 1-5 reps - Hooklying Clamshell with Resistance  - 2 x daily - 3-4 x weekly - 1 sets - 10 reps - Seated Long Arc Quad  - 1 x daily - 7 x weekly - 1-3 sets - 10 reps - 5 sec  hold - Standing Hip Abduction with Counter Support  - 1 x daily - 7 x weekly - 1-2 sets - 10  reps - Standing March with Counter Support  - 1 x daily - 7 x weekly - 1-2 sets - 10 reps - Standing Heel Raise with Support  - 1 x daily - 7 x weekly - 1-2 sets - 10 reps - Shoulder External Rotation and Scapular Retraction with Resistance  - 1 x daily - 7 x weekly - 2 sets - 10 reps - Sit to Stand with Armchair  - 1 x daily - 7 x weekly - 1 sets - 5 reps  Patient Education - Tips to reduce freezing episodes with standing or walking   ASSESSMENT:  CLINICAL IMPRESSION: Patient arrives using rollator walker but gait speed is extra slow with shuffled, short steps.  He fatigues faster than usual today with shorter periods of time in standing and walker compared to prior visits.  Verbal cues to stand fully erect, increase step length and for forward gaze.  He reports more hand and arm discomfort secondary to heavier lean on arms on the walker and parallel bars.  We discussed the importance of home exercise on a daily basis to build stamina.     OBJECTIVE IMPAIRMENTS: Abnormal gait, decreased balance, decreased ROM, decreased strength, decreased safety awareness, dizziness, impaired flexibility, impaired sensation, postural dysfunction, and pain.   ACTIVITY LIMITATIONS: carrying, lifting, bending, standing, squatting, stairs, transfers, bed mobility, continence, bathing, and locomotion level  PARTICIPATION LIMITATIONS: shopping and community activity  PERSONAL FACTORS: Age, Fitness, Time since onset of injury/illness/exacerbation, and 3+ comorbidities: Compression fx L1 2022, CAD, CKD (stage 4), neuropathy, retinopathy  are also affecting patient's functional outcome.   REHAB POTENTIAL: Good  CLINICAL DECISION MAKING: Unstable/unpredictable  EVALUATION COMPLEXITY: High   GOALS: Goals reviewed with patient? Yes  SHORT TERM GOALS: Target date: 07/30/23 Improved TUG to < 25 sec to decrease fall risk.  Baseline: Goal status: IN PROGRESS 09/15/2023  2.  Able to perform 5XSTS without UE  assist and controlled descent showing improved LE strength.  Baseline:  Goal status: IN PROGRESS 09/15/2023  3.  Patient will be able to stand safely for 2 min without UE assist Baseline:  Goal status:IN PROGRESS 09/15/2023  LONG TERM GOALS: Target date: 01/05/2024  Ind with advanced HEP Baseline:  Goal status: IN PROGRESS 09/15/2023  2.  Sit to stand from the recliner with PSFS score of 5 Baseline:  Goal status: revised   3.  Gait with RW from the bedroom to the sunroom 40-50 feet with PSFS of 3 Baseline:  Goal status:revised   4. Improved LE mobility needed for greater ease with putting on socks and shoes in sitting with PSFS of 5 Baseline:  Goal status: revised 5.  Patient will be able to stand long enough to brush teeth PSFS of 2 Baseline:  Goal status: revised   PLAN:  PT FREQUENCY:  2x week  PT DURATION: 8 weeks  PLANNED INTERVENTIONS: 97164- PT Re-evaluation, 97110-Therapeutic exercises, 97530- Therapeutic activity, 97112- Neuromuscular re-education, 97535- Self Care, 16109- Manual therapy, [phone]- Gait training, [phone]- Aquatic Therapy, 97014- Electrical stimulation (unattended), Patient/Family education, Balance training, Stair training, Taping, Joint mobilization, Spinal mobilization, DME instructions, Cryotherapy, and Moist heat  PLAN FOR NEXT SESSION: static standing with teal power cord isometric trunk extension;  UE movements while standing for ADLs; gait in // bars working on standing erect, longer step lengths;  assess energy levels and endurance ; functional strengthening;  no leg press  Darien Eden, PT 12/08/23 5:16 PM Phone: [phone] Fax: [phone]

## 2023-12-10 ENCOUNTER — Ambulatory Visit: Attending: Family Medicine | Admitting: Physical Therapy

## 2023-12-10 DIAGNOSIS — R262 Difficulty in walking, not elsewhere classified: Secondary | ICD-10-CM | POA: Diagnosis not present

## 2023-12-10 DIAGNOSIS — M6281 Muscle weakness (generalized): Secondary | ICD-10-CM | POA: Insufficient documentation

## 2023-12-10 DIAGNOSIS — R2689 Other abnormalities of gait and mobility: Secondary | ICD-10-CM | POA: Diagnosis not present

## 2023-12-10 NOTE — Therapy (Signed)
 OUTPATIENT PHYSICAL THERAPY LOWER EXTREMITY TREATMENT  Patient Name: Jeffrey [name] MRN: [id number] DOB:January 18, 1956, 68 y.o., male Today's Date: 12/10/2023    END OF SESSION:  PT End of Session - 12/10/23 1501     Visit Number 26    Date for PT Re-Evaluation 01/05/24    Authorization Type HTA    Progress Note Due on Visit 30    PT Start Time 1448    PT Stop Time 1529    PT Time Calculation (min) 41 min    Equipment Utilized During Treatment Gait belt    Activity Tolerance Patient tolerated treatment well                      Past Medical History:  Diagnosis Date   Cardiac arrest ([name]) 05/22/2016   Cataract    CHF (congestive heart failure) ([name])    CKD (chronic kidney disease) stage 3, GFR 30-59 ml/min ([name]) 05/28/2021   CKD (chronic kidney disease) stage 4, GFR 15-29 ml/min ([name]) 05/28/2021   Diabetes mellitus without complication ([name])    type 2   Diabetic retinopathy ([name])    Hyperlipidemia    Hypertension    Memory loss    mild   Myocardial infarct ([name]) 2105   Retinopathy    Both eyes   S/P primary angioplasty with coronary stent 05/22/2016   Vitamin B12 deficiency    Vitreous hemorrhage of left eye ([name])    proliferative diabetic retinopathy   Past Surgical History:  Procedure Laterality Date   ANGIOPLASTY     2015   COLONOSCOPY     multiple   EYE SURGERY     PARS PLANA VITRECTOMY Left 03/15/2020   Procedure: PARS PLANA VITRECTOMY WITH 25 GAUGE;  Surgeon: Linard Reno, MD;  Location: [facility] OR;  Service: Ophthalmology;  Laterality: Left;   PHOTOCOAGULATION WITH LASER Left 03/15/2020   Procedure: PHOTOCOAGULATION WITH LASER; INTRAVITREAL INJECTION OF AVASTIN ;  Surgeon: Linard Reno, MD;  Location: [facility] OR;  Service: Ophthalmology;  Laterality: Left;   REFRACTIVE SURGERY     UPPER GI ENDOSCOPY     several   VITRECTOMY     Patient Active Problem List   Diagnosis Date Noted   Actinic keratosis 11/30/2023   Vitamin D  deficiency 11/30/2023    Rhinitis 05/20/2023   Cervical spondylosis 08/26/2022   CKD (chronic kidney disease) stage 4, GFR 15-29 ml/min ([name]) 05/28/2021   Adjustment disorder 01/22/2021   Chronic back pain 10/22/2020   Peripheral vertigo 05/15/2020   Dermatitis 01/04/2020   Unintentional weight loss with loose stools 05/19/2019   Low vitamin B12 level 03/29/2019   Heme positive stool 03/14/2019   Normocytic anemia 03/14/2019   Chronic diastolic heart failure ([name]) 02/08/2019   Diabetic retinopathy ([name]) 09/08/2018   Physical debility 05/24/2018   Varicose veins of both lower extremities 03/01/2018   Fatigue due to exertion 10/14/2017   GERD (gastroesophageal reflux disease) 08/25/2017   CAD in native artery 04/14/2017   Hyperlipidemia associated with type 2 diabetes mellitus ([name]) 04/14/2017   Diabetic peripheral neuropathy associated with type 2 diabetes mellitus ([name]) 08/17/2016   [name] 05/02/2016   Hypertension associated with diabetes ([name]) 05/02/2016    PCP: Rodney Clamp, MD   REFERRING PROVIDER: Rodney Clamp, MD   REFERRING DIAG: R53.81 (ICD-10-CM) - Physical debility   THERAPY DIAG:  Muscle weakness (generalized)  Other abnormalities of gait and mobility  Difficulty in walking, not elsewhere classified  Rationale for  Evaluation and Treatment: Rehabilitation  ONSET DATE: 2022  SUBJECTIVE:   SUBJECTIVE STATEMENT: Arrives in transport chair. Wearing diabetic shoes, 1 compression sock to help with leg pain (states the cardiologist is against wearing them b/c they loosen bloodclotst.  Walked at home yesterday.   Brother: Jeffrey [name] identical twins  PERTINENT HISTORY: 3/11: evidence of lumbar compression deformity Compression fx L1 2022, CAD, CKD (stage 4), DM, cardiac arrest 2017, neuropathy, retinopathy Long history of gait abnormality beginning back in 2022. He has a history of CAD and believes that his debility began following a covid infection. In  December odf 2022 he fell and suffered a compression fracture of L1.  PAIN:  Are you having pain? No  PRECAUTIONS: Fall  RED FLAGS: None   WEIGHT BEARING RESTRICTIONS: No  FALLS:  Has patient fallen in last 6 months? No and with sit to stand falls back sometimes  LIVING ENVIRONMENT: Lives with: lives with their family Lives in: House/apartment Stairs: No Has following equipment at home: Single point cane, Environmental consultant - 2 wheeled, Environmental consultant - 4 wheeled, shower chair, Grab bars, and stand up walker also; also has shower chair at sink for shaving, brushing teeth etc, also has transporter.  OCCUPATION: retired  PLOF: Independent with household mobility with device  PATIENT GOALS: to walk without a walker  NEXT MD VISIT: 09/28/22  OBJECTIVE:  Note: Objective measures were completed at Evaluation unless otherwise noted.  DIAGNOSTIC FINDINGS: N/A  COGNITION: Overall cognitive status: Within functional limits for tasks assessed     SENSATION: Neuropathy in B feet   MUSCLE LENGTH: Marked HS R>L, hip flexor tightness   POSTURE: rounded shoulders and forward head  LOWER EXTREMITY ROM: WFL for tasks assessed  LOWER EXTREMITY MMT: *tested in hooklying  MMT Right eval Left eval 4/1   Hip flexion 4 4 4  Bil  Hip extension Able to bridge Able to bridge 4- Bil  Hip abduction 4-* 4-* 4- Bil  Hip adduction     Hip internal rotation     Hip external rotation     Knee flexion     Knee extension 4 4+ 4- Bil  Ankle dorsiflexion 4+ 4- 4- Bil  Ankle plantarflexion     Ankle inversion     Ankle eversion      (Blank rows = not tested)  3/20:  max UE assist to rise sit to stand; LE hip extension, knee extension grossly 3+ to 4-/5  FUNCTIONAL TESTS:  30 seconds chair stand test Timed up and go (TUG): 59.36 with 4WW Stance time: 45 sec patient reports unsteadiness and feels he goes backwards 07/27/2023 92 ft decreased cadence; decreased step length; absent heel  strike  12/26: 5x STS: unable to rise without heavy UE use; attempted to use armrests for test but does not come all the way up;  next trial with chair +cushion to touch at least one RW handle 48.2 sec:   TUG: cushion in seat +arm use and RW:  52.49 sec  08/13/23 attempted 5x STS but pt unable to complete: 1st rep does not come all the way up and then plops heavily into the transport wheelchair (he then recounts how this happened at home and how the chair tipped backwards) unable to complete reps Unable to complete TUG today, he does not have his 4 wheeled walker today and feels he is slower with the clinic RW  09/15/2023 TUG: 1:03 with rollator 5STS:48.55 sec heavy reliance on UE support; last repetition was a challenge  3/20: Heavy UE reliance with STS (unable to push up from [facility] armrests, pulls up from // bars) 4/1: max UE assist with sit to stand  GAIT: Distance walked: 25 Assistive device utilized: Walker - 4 wheeled Level of assistance: Modified independence Comments: slow speed, decreased step and stride, decreased heel strike, forward trunk lean.  TRANSFERS: Sit to stand: Uses walker to pull up; stand to sit: does not fully back up to chair and does not reach back; Supine to sit: needs min A for log roll  (has some back pain with sit <>supine due to sitting upward from supine) also fear of falling from bed, sit to supine needs min A to lift legs on to mat table The Patient-Specific Functional Scale  Initial:  I am going to ask you to identify up to 3 important activities that you are unable to do or are having difficulty with as a result of this problem.  Today are there any activities that you are unable to do or having difficulty with because of this?  (Patient shown scale and patient rated each activity)  Follow up: When you first came in you had difficulty performing these activities.  Today do you still have difficulty?  Patient-Specific activity scoring scheme (Point to one  number):  0 1 2 3 4 5 6 7 8 9  10 Unable                                                                                                          Able to perform To perform                                                                                                    activity at the same Activity         Level as before                                                                                                                       Injury or problem  Activity            Getting out of the recliner  Initial:       3                 2.                   Walking with RW 40 feet                                                                Initial:           1           3.                          Standing to brush teeth                                                           Initial:       0 (has to do sitting on shower chair)                 4. Putting on socks and shoes  while sitting                                                      initial 2-3  TODAY'S TREATMENT:        12/08/23: Gait belt on for safety Standing in // bars with multi plane and weight shifting and UE reaching to give "high fives" and "low fives" Trunk extension isometric 15 sec holds 3x (medium purple power cord anchored by therapist)  side to side reaching to place cones Trunk rotation backwards to place cone  Side stepping, backwards, marching, long steps with double, single arm support Walking in // bars with 2# weight carry forward Sit to stand from transport chair with foam cushion with decreased UE support 10x (much better control) Gait in // bars without holding on working on arm swing                DATE:   12/01/23: Gait belt on for safety Standing in // bars with multi plane toe taps alternating Standing within // bars: teal 35# power cord around upper trunk (anchored by therapist) isometric hold of standing tall 30 sec x2 Static  standing UE reaching to therapist hands in multiple planes to simulate brushing teeth and performing other ADLS at the sink Neuromuscular re-ed dynamic balance with balloon taps single and double UE use 3 rounds of walking and standing in // bars: approx 6-8 min each round 2 inch forward step ups 1 sets of 5 (2nd set with single arm support) cues in // bars to encourage longer step lengths 4 laps Foot prop on 2 inch step with UE reaches multi planes and head turns  Pt able to reach down to put down WC foot rests and put feet on rests   11/17/23: Gait with 4 wheeled RW 25 feet  with decreased gait speed, heavy reliance on Ues Standing within // bars: teal 35# power cord around upper trunk (anchored by therapist) isometric hold of standing tall 60 sec x2 Static standing UE reaching to therapist hands in multiple planes to simulate brushing teeth and performing other ADLS at the sink 3 rounds of walking and standing in // bars: approx 6 min each round 2 inch forward step ups 2 sets of 5 (2nd set with single arm support) Stepping stones/circles in // bars to encourage longer step lengths 4 laps Seated 2# bicep curls 10x right/left Seated 2# overhead press 10x right/left Gait with RW 20 feet to lobby, seated rest 2 min Gait with RW from lobby, down outside ramp to the car CGA; transport chair following behind in case of over fatigue but not used Encouraged regular walking at home with RW with brother assisting for safety 11/12/23: Gait with 4 wheeled RW 25 feet with decreased gait speed, heavy reliance on UEs 3 rounds of walking and standing in // bars: approx 6 min each round Standing in // bars: alternating toe taps to foam Standing with alternating UE reaches 10x Foot prop on foam with head turns/arm reaches // bars: 6 laps with CGA with gait belt, 3 laps without railing assist; 3 laps with single side support working on arm swing and longer step lengths Used metronome 58 setting to increase  gait speed in // bars  Seated 3# LAQ 10x right/left Seated 3# hip flexion up and over line 10x right/left Static standing within // bars no touch (moderate challenge to balance) PATIENT EDUCATION:  Education details: PT eval findings, anticipated POC, initial HEP, postural awareness, and transfer safety  Person educated: Patient and brother Education method: Explanation, Demonstration, and Handouts Education comprehension: verbalized understanding and returned demonstration  HOME EXERCISE PROGRAM: Access Code: [email or web address] URL: [email or web address] Date: 09/15/2023 Prepared by: Penelope Bowie  Exercises - Sit to Stand with Counter Support  - 3 x daily - 3-4 x weekly - 1 sets - 1-5 reps - Hooklying Clamshell with Resistance  - 2 x daily - 3-4 x weekly - 1 sets - 10 reps - Seated Long Arc Quad  - 1 x daily - 7 x weekly - 1-3 sets - 10 reps - 5 sec hold - Standing Hip Abduction with Counter Support  - 1 x daily - 7 x weekly - 1-2 sets - 10 reps - Standing March with Counter Support  - 1 x daily - 7 x weekly - 1-2 sets - 10 reps - Standing Heel Raise with Support  - 1 x daily - 7 x weekly - 1-2 sets - 10 reps - Shoulder External Rotation and Scapular Retraction with Resistance  - 1 x daily - 7 x weekly - 2 sets - 10 reps - Sit to Stand with Armchair  - 1 x daily - 7 x weekly - 1 sets - 5 reps  Patient Education - Tips to reduce freezing episodes with standing or walking   ASSESSMENT:  CLINICAL IMPRESSION: Much improved step length and foot clearance with diabetic shoes he is wearing today.  Improved standing time today with less fatigue/fewer rest breaks. He is able to rise sit to stand with less UE use with added foam cushion in the seat of the transport chair and descend to the chair with better control.  CGA/close supervision for safety.     OBJECTIVE IMPAIRMENTS: Abnormal gait, decreased balance, decreased ROM, decreased strength, decreased safety awareness, dizziness,  impaired flexibility, impaired  sensation, postural dysfunction, and pain.   ACTIVITY LIMITATIONS: carrying, lifting, bending, standing, squatting, stairs, transfers, bed mobility, continence, bathing, and locomotion level  PARTICIPATION LIMITATIONS: shopping and community activity  PERSONAL FACTORS: Age, Fitness, Time since onset of injury/illness/exacerbation, and 3+ comorbidities: Compression fx L1 2022, CAD, CKD (stage 4), neuropathy, retinopathy  are also affecting patient's functional outcome.   REHAB POTENTIAL: Good  CLINICAL DECISION MAKING: Unstable/unpredictable  EVALUATION COMPLEXITY: High   GOALS: Goals reviewed with patient? Yes  SHORT TERM GOALS: Target date: 07/30/23 Improved TUG to < 25 sec to decrease fall risk.  Baseline: Goal status: IN PROGRESS 09/15/2023  2.  Able to perform 5XSTS without UE assist and controlled descent showing improved LE strength.  Baseline:  Goal status: IN PROGRESS 09/15/2023  3.  Patient will be able to stand safely for 2 min without UE assist Baseline:  Goal status:IN PROGRESS 09/15/2023  LONG TERM GOALS: Target date: 01/05/2024  Ind with advanced HEP Baseline:  Goal status: IN PROGRESS 09/15/2023  2.  Sit to stand from the recliner with PSFS score of 5 Baseline:  Goal status: revised   3.  Gait with RW from the bedroom to the sunroom 40-50 feet with PSFS of 3 Baseline:  Goal status:revised   4. Improved LE mobility needed for greater ease with putting on socks and shoes in sitting with PSFS of 5 Baseline:  Goal status: revised 5.  Patient will be able to stand long enough to brush teeth PSFS of 2 Baseline:  Goal status: revised   PLAN:  PT FREQUENCY:  2x week  PT DURATION: 8 weeks  PLANNED INTERVENTIONS: 97164- PT Re-evaluation, 97110-Therapeutic exercises, 97530- Therapeutic activity, 97112- Neuromuscular re-education, 97535- Self Care, 16109- Manual therapy, [phone]- Gait training, [phone]- Aquatic Therapy, 97014- Electrical  stimulation (unattended), Patient/Family education, Balance training, Stair training, Taping, Joint mobilization, Spinal mobilization, DME instructions, Cryotherapy, and Moist heat  PLAN FOR NEXT SESSION: sit to stand from foam pad; static standing with teal power cord isometric trunk extension;  UE movements while standing for ADLs; gait in // bars working on standing erect, longer step lengths;  assess energy levels and endurance ; functional strengthening; no leg press  Darien Eden, PT 12/10/23 5:58 PM Phone: [phone] Fax: [phone]

## 2023-12-11 DIAGNOSIS — H04123 Dry eye syndrome of bilateral lacrimal glands: Secondary | ICD-10-CM | POA: Diagnosis not present

## 2023-12-15 ENCOUNTER — Ambulatory Visit: Admitting: Physical Therapy

## 2023-12-15 DIAGNOSIS — M6281 Muscle weakness (generalized): Secondary | ICD-10-CM

## 2023-12-15 DIAGNOSIS — R262 Difficulty in walking, not elsewhere classified: Secondary | ICD-10-CM

## 2023-12-15 DIAGNOSIS — R2689 Other abnormalities of gait and mobility: Secondary | ICD-10-CM

## 2023-12-15 NOTE — Therapy (Signed)
 OUTPATIENT PHYSICAL THERAPY LOWER EXTREMITY TREATMENT  Patient Name: Jeffrey [name] MRN: [id number] DOB:October 19, 1955, 68 y.o., male Today's Date: 12/15/2023    END OF SESSION:  PT End of Session - 12/15/23 1400     Visit Number 27    Date for PT Re-Evaluation 01/05/24    Authorization Type HTA    Progress Note Due on Visit 30    PT Start Time 1400    PT Stop Time 1444    PT Time Calculation (min) 44 min    Equipment Utilized During Treatment Gait belt    Activity Tolerance Patient tolerated treatment well                      Past Medical History:  Diagnosis Date   Cardiac arrest ([name]) 05/22/2016   Cataract    CHF (congestive heart failure) ([name])    CKD (chronic kidney disease) stage 3, GFR 30-59 ml/min ([name]) 05/28/2021   CKD (chronic kidney disease) stage 4, GFR 15-29 ml/min ([name]) 05/28/2021   Diabetes mellitus without complication ([name])    type 2   Diabetic retinopathy ([name])    Hyperlipidemia    Hypertension    Memory loss    mild   Myocardial infarct ([name]) 2105   Retinopathy    Both eyes   S/P primary angioplasty with coronary stent 05/22/2016   Vitamin B12 deficiency    Vitreous hemorrhage of left eye ([name])    proliferative diabetic retinopathy   Past Surgical History:  Procedure Laterality Date   ANGIOPLASTY     2015   COLONOSCOPY     multiple   EYE SURGERY     PARS PLANA VITRECTOMY Left 03/15/2020   Procedure: PARS PLANA VITRECTOMY WITH 25 GAUGE;  Surgeon: Linard Reno, MD;  Location: [facility] OR;  Service: Ophthalmology;  Laterality: Left;   PHOTOCOAGULATION WITH LASER Left 03/15/2020   Procedure: PHOTOCOAGULATION WITH LASER; INTRAVITREAL INJECTION OF AVASTIN ;  Surgeon: Linard Reno, MD;  Location: [facility] OR;  Service: Ophthalmology;  Laterality: Left;   REFRACTIVE SURGERY     UPPER GI ENDOSCOPY     several   VITRECTOMY     Patient Active Problem List   Diagnosis Date Noted   Actinic keratosis 11/30/2023   Vitamin D  deficiency 11/30/2023    Rhinitis 05/20/2023   Cervical spondylosis 08/26/2022   CKD (chronic kidney disease) stage 4, GFR 15-29 ml/min ([name]) 05/28/2021   Adjustment disorder 01/22/2021   Chronic back pain 10/22/2020   Peripheral vertigo 05/15/2020   Dermatitis 01/04/2020   Unintentional weight loss with loose stools 05/19/2019   Low vitamin B12 level 03/29/2019   Heme positive stool 03/14/2019   Normocytic anemia 03/14/2019   Chronic diastolic heart failure ([name]) 02/08/2019   Diabetic retinopathy ([name]) 09/08/2018   Physical debility 05/24/2018   Varicose veins of both lower extremities 03/01/2018   Fatigue due to exertion 10/14/2017   GERD (gastroesophageal reflux disease) 08/25/2017   CAD in native artery 04/14/2017   Hyperlipidemia associated with type 2 diabetes mellitus ([name]) 04/14/2017   Diabetic peripheral neuropathy associated with type 2 diabetes mellitus ([name]) 08/17/2016   [name] 05/02/2016   Hypertension associated with diabetes ([name]) 05/02/2016    PCP: Rodney Clamp, MD   REFERRING PROVIDER: Rodney Clamp, MD   REFERRING DIAG: R53.81 (ICD-10-CM) - Physical debility   THERAPY DIAG:  Muscle weakness (generalized)  Other abnormalities of gait and mobility  Difficulty in walking, not elsewhere classified  Rationale for  Evaluation and Treatment: Rehabilitation  ONSET DATE: 2022  SUBJECTIVE:   SUBJECTIVE STATEMENT: Arrives in transport chair. Back pain comes and goes.  Wearing regular sneakers today (diabetic shoes get uncomfortable). May be getting new diabetic shoes.      Brother: Jeffrey [name] identical twins  PERTINENT HISTORY: 3/11: evidence of lumbar compression deformity Compression fx L1 2022, CAD, CKD (stage 4), DM, cardiac arrest 2017, neuropathy, retinopathy Long history of gait abnormality beginning back in 2022. He has a history of CAD and believes that his debility began following a covid infection. In December odf 2022 he fell and suffered  a compression fracture of L1.  PAIN:  Are you having pain? No  PRECAUTIONS: Fall  RED FLAGS: None   WEIGHT BEARING RESTRICTIONS: No  FALLS:  Has patient fallen in last 6 months? No and with sit to stand falls back sometimes  LIVING ENVIRONMENT: Lives with: lives with their family Lives in: House/apartment Stairs: No Has following equipment at home: Single point cane, Environmental consultant - 2 wheeled, Environmental consultant - 4 wheeled, shower chair, Grab bars, and stand up walker also; also has shower chair at sink for shaving, brushing teeth etc, also has transporter.  OCCUPATION: retired  PLOF: Independent with household mobility with device  PATIENT GOALS: to walk without a walker  NEXT MD VISIT: 09/28/22  OBJECTIVE:  Note: Objective measures were completed at Evaluation unless otherwise noted.  DIAGNOSTIC FINDINGS: N/A  COGNITION: Overall cognitive status: Within functional limits for tasks assessed     SENSATION: Neuropathy in B feet   MUSCLE LENGTH: Marked HS R>L, hip flexor tightness   POSTURE: rounded shoulders and forward head  LOWER EXTREMITY ROM: WFL for tasks assessed  LOWER EXTREMITY MMT: *tested in hooklying  MMT Right eval Left eval 4/1   Hip flexion 4 4 4  Bil  Hip extension Able to bridge Able to bridge 4- Bil  Hip abduction 4-* 4-* 4- Bil  Hip adduction     Hip internal rotation     Hip external rotation     Knee flexion     Knee extension 4 4+ 4- Bil  Ankle dorsiflexion 4+ 4- 4- Bil  Ankle plantarflexion     Ankle inversion     Ankle eversion      (Blank rows = not tested)  3/20:  max UE assist to rise sit to stand; LE hip extension, knee extension grossly 3+ to 4-/5  FUNCTIONAL TESTS:  30 seconds chair stand test Timed up and go (TUG): 59.36 with 4WW Stance time: 45 sec patient reports unsteadiness and feels he goes backwards 07/27/2023 92 ft decreased cadence; decreased step length; absent heel strike  12/26: 5x STS: unable to rise without heavy  UE use; attempted to use armrests for test but does not come all the way up;  next trial with chair +cushion to touch at least one RW handle 48.2 sec:   TUG: cushion in seat +arm use and RW:  52.49 sec  08/13/23 attempted 5x STS but pt unable to complete: 1st rep does not come all the way up and then plops heavily into the transport wheelchair (he then recounts how this happened at home and how the chair tipped backwards) unable to complete reps Unable to complete TUG today, he does not have his 4 wheeled walker today and feels he is slower with the clinic RW  09/15/2023 TUG: 1:03 with rollator 5STS:48.55 sec heavy reliance on UE support; last repetition was a challenge  3/20: Heavy UE reliance  with STS (unable to push up from [facility] armrests, pulls up from // bars) 4/1: max UE assist with sit to stand  GAIT: Distance walked: 25 Assistive device utilized: Walker - 4 wheeled Level of assistance: Modified independence Comments: slow speed, decreased step and stride, decreased heel strike, forward trunk lean.  TRANSFERS: Sit to stand: Uses walker to pull up; stand to sit: does not fully back up to chair and does not reach back; Supine to sit: needs min A for log roll  (has some back pain with sit <>supine due to sitting upward from supine) also fear of falling from bed, sit to supine needs min A to lift legs on to mat table The Patient-Specific Functional Scale  Initial:  I am going to ask you to identify up to 3 important activities that you are unable to do or are having difficulty with as a result of this problem.  Today are there any activities that you are unable to do or having difficulty with because of this?  (Patient shown scale and patient rated each activity)  Follow up: When you first came in you had difficulty performing these activities.  Today do you still have difficulty?  Patient-Specific activity scoring scheme (Point to one number):  0 1 2 3 4 5 6 7 8 9  10 Unable                                                                                                           Able to perform To perform                                                                                                    activity at the same Activity         Level as before                                                                                                                       Injury or problem  Activity            Getting out of the recliner  Initial:       3                 2.                   Walking with RW 40 feet                                                                Initial:           1           3.                          Standing to brush teeth                                                           Initial:       0 (has to do sitting on shower chair)                 4. Putting on socks and shoes  while sitting                                                      initial 2-3  TODAY'S TREATMENT:  12/15/23: Gait belt on for safety: 1st round 12 min, 2nd round 12:08, 3rd round 8 min Standing in // bars with multi plane and weight shifting and UE reaching to give "high fives" and "low fives" Bil UE reaching overhead to challenge static balance (very challenging) 2 inch step ups 5x right/left Yellow band bil rows 10x Yellow band 2 steps walk back, forward (very challenging) Side stepping, backwards, marching, long steps with double, single arm support Sit to stand from transport chair with foam cushion with decreased UE support 2 sets of 5 (cues for push from [facility] rather than pull from // bars) Gait in // bars without holding on working on arm swing      RPE 5/10           12/08/23: Gait belt on for safety Standing in // bars with multi plane and weight shifting and UE reaching to give "high fives" and "low fives" Trunk extension isometric 15 sec holds 3x (medium purple power cord anchored by therapist)   side to side reaching to place cones Trunk rotation backwards to place cone  Side stepping, backwards, marching, long steps with double, single arm support Walking in // bars with 2# weight carry forward Sit to stand from transport chair with foam cushion with decreased UE support 10x (much better control) Gait in // bars without holding on working on arm swing                DATE:   12/01/23: Gait belt on for safety Standing in // bars with multi plane toe taps alternating Standing within // bars: teal 35# power cord  around upper trunk (anchored by therapist) isometric hold of standing tall 30 sec x2 Static standing UE reaching to therapist hands in multiple planes to simulate brushing teeth and performing other ADLS at the sink Neuromuscular re-ed dynamic balance with balloon taps single and double UE use 3 rounds of walking and standing in // bars: approx 6-8 min each round 2 inch forward step ups 1 sets of 5 (2nd set with single arm support) cues in // bars to encourage longer step lengths 4 laps Foot prop on 2 inch step with UE reaches multi planes and head turns  Pt able to reach down to put down WC foot rests and put feet on rests   11/17/23: Gait with 4 wheeled RW 25 feet with decreased gait speed, heavy reliance on Ues Standing within // bars: teal 35# power cord around upper trunk (anchored by therapist) isometric hold of standing tall 60 sec x2 Static standing UE reaching to therapist hands in multiple planes to simulate brushing teeth and performing other ADLS at the sink 3 rounds of walking and standing in // bars: approx 6 min each round 2 inch forward step ups 2 sets of 5 (2nd set with single arm support) Stepping stones/circles in // bars to encourage longer step lengths 4 laps Seated 2# bicep curls 10x right/left Seated 2# overhead press 10x right/left Gait with RW 20 feet to lobby, seated rest 2 min Gait with RW from lobby, down outside ramp to the car CGA; transport  chair following behind in case of over fatigue but not used Encouraged regular walking at home with RW with brother assisting for safety  PATIENT EDUCATION:  Education details: PT eval findings, anticipated POC, initial HEP, postural awareness, and transfer safety  Person educated: Patient and brother Education method: Explanation, Demonstration, and Handouts Education comprehension: verbalized understanding and returned demonstration  HOME EXERCISE PROGRAM: Access Code: [email or web address] URL: [email or web address] Date: 09/15/2023 Prepared by: Penelope Bowie  Exercises - Sit to Stand with Counter Support  - 3 x daily - 3-4 x weekly - 1 sets - 1-5 reps - Hooklying Clamshell with Resistance  - 2 x daily - 3-4 x weekly - 1 sets - 10 reps - Seated Long Arc Quad  - 1 x daily - 7 x weekly - 1-3 sets - 10 reps - 5 sec hold - Standing Hip Abduction with Counter Support  - 1 x daily - 7 x weekly - 1-2 sets - 10 reps - Standing March with Counter Support  - 1 x daily - 7 x weekly - 1-2 sets - 10 reps - Standing Heel Raise with Support  - 1 x daily - 7 x weekly - 1-2 sets - 10 reps - Shoulder External Rotation and Scapular Retraction with Resistance  - 1 x daily - 7 x weekly - 2 sets - 10 reps - Sit to Stand with Armchair  - 1 x daily - 7 x weekly - 1 sets - 5 reps  Patient Education - Tips to reduce freezing episodes with standing or walking   ASSESSMENT:  CLINICAL IMPRESSION: Much improved standing tolerance time today with decreased lean on Ues. Treatment focus on static and low level dynamic balance with CGA Jenifer Miu supervision for safety.  Verbal cues for forward gaze and upright standing posture.  Less overall fatigue at the end of session, rated at a moderate level.      OBJECTIVE IMPAIRMENTS: Abnormal gait, decreased balance, decreased ROM, decreased strength, decreased safety awareness, dizziness, impaired flexibility, impaired  sensation, postural dysfunction, and pain.    ACTIVITY LIMITATIONS: carrying, lifting, bending, standing, squatting, stairs, transfers, bed mobility, continence, bathing, and locomotion level  PARTICIPATION LIMITATIONS: shopping and community activity  PERSONAL FACTORS: Age, Fitness, Time since onset of injury/illness/exacerbation, and 3+ comorbidities: Compression fx L1 2022, CAD, CKD (stage 4), neuropathy, retinopathy  are also affecting patient's functional outcome.   REHAB POTENTIAL: Good  CLINICAL DECISION MAKING: Unstable/unpredictable  EVALUATION COMPLEXITY: High   GOALS: Goals reviewed with patient? Yes  SHORT TERM GOALS: Target date: 07/30/23 Improved TUG to < 25 sec to decrease fall risk.  Baseline: Goal status: IN PROGRESS 09/15/2023  2.  Able to perform 5XSTS without UE assist and controlled descent showing improved LE strength.  Baseline:  Goal status: IN PROGRESS 09/15/2023  3.  Patient will be able to stand safely for 2 min without UE assist Baseline:  Goal status:IN PROGRESS 09/15/2023  LONG TERM GOALS: Target date: 01/05/2024  Ind with advanced HEP Baseline:  Goal status: IN PROGRESS 09/15/2023  2.  Sit to stand from the recliner with PSFS score of 5 Baseline:  Goal status: revised   3.  Gait with RW from the bedroom to the sunroom 40-50 feet with PSFS of 3 Baseline:  Goal status:revised   4. Improved LE mobility needed for greater ease with putting on socks and shoes in sitting with PSFS of 5 Baseline:  Goal status: revised 5.  Patient will be able to stand long enough to brush teeth PSFS of 2 Baseline:  Goal status: revised   PLAN:  PT FREQUENCY:  2x week  PT DURATION: 8 weeks  PLANNED INTERVENTIONS: 97164- PT Re-evaluation, 97110-Therapeutic exercises, 97530- Therapeutic activity, 97112- Neuromuscular re-education, 97535- Self Care, 40981- Manual therapy, [phone]- Gait training, [phone]- Aquatic Therapy, 97014- Electrical stimulation (unattended), Patient/Family education, Balance training,  Stair training, Taping, Joint mobilization, Spinal mobilization, DME instructions, Cryotherapy, and Moist heat  PLAN FOR NEXT SESSION: sit to stand from foam pad; static standing with teal power cord isometric trunk extension;  UE movements while standing for ADLs; gait in // bars working on standing erect, longer step lengths;  assess energy levels and endurance ; functional strengthening; no leg press  Darien Eden, PT 12/15/23 5:49 PM Phone: [phone] Fax: [phone]

## 2023-12-17 ENCOUNTER — Ambulatory Visit: Admitting: Physical Therapy

## 2023-12-17 DIAGNOSIS — M6281 Muscle weakness (generalized): Secondary | ICD-10-CM | POA: Diagnosis not present

## 2023-12-17 DIAGNOSIS — R2689 Other abnormalities of gait and mobility: Secondary | ICD-10-CM

## 2023-12-17 DIAGNOSIS — R262 Difficulty in walking, not elsewhere classified: Secondary | ICD-10-CM

## 2023-12-17 NOTE — Therapy (Signed)
 OUTPATIENT PHYSICAL THERAPY LOWER EXTREMITY TREATMENT  Patient Name: Jeffrey [name] MRN: [id number] DOB:1956-04-23, 68 y.o., male Today's Date: 12/17/2023    END OF SESSION:  PT End of Session - 12/17/23 1551     Visit Number 28    Date for PT Re-Evaluation 01/05/24    Authorization Type HTA    Progress Note Due on Visit 30    PT Start Time 1530    PT Stop Time 1614    PT Time Calculation (min) 44 min    Equipment Utilized During Treatment Gait belt    Activity Tolerance Patient tolerated treatment well                      Past Medical History:  Diagnosis Date   Cardiac arrest ([name]) 05/22/2016   Cataract    CHF (congestive heart failure) ([name])    CKD (chronic kidney disease) stage 3, GFR 30-59 ml/min ([name]) 05/28/2021   CKD (chronic kidney disease) stage 4, GFR 15-29 ml/min ([name]) 05/28/2021   Diabetes mellitus without complication ([name])    type 2   Diabetic retinopathy ([name])    Hyperlipidemia    Hypertension    Memory loss    mild   Myocardial infarct ([name]) 2105   Retinopathy    Both eyes   S/P primary angioplasty with coronary stent 05/22/2016   Vitamin B12 deficiency    Vitreous hemorrhage of left eye ([name])    proliferative diabetic retinopathy   Past Surgical History:  Procedure Laterality Date   ANGIOPLASTY     2015   COLONOSCOPY     multiple   EYE SURGERY     PARS PLANA VITRECTOMY Left 03/15/2020   Procedure: PARS PLANA VITRECTOMY WITH 25 GAUGE;  Surgeon: Jeffrey Reno, MD;  Location: [facility] OR;  Service: Ophthalmology;  Laterality: Left;   PHOTOCOAGULATION WITH LASER Left 03/15/2020   Procedure: PHOTOCOAGULATION WITH LASER; INTRAVITREAL INJECTION OF AVASTIN ;  Surgeon: Jeffrey Reno, MD;  Location: [facility] OR;  Service: Ophthalmology;  Laterality: Left;   REFRACTIVE SURGERY     UPPER GI ENDOSCOPY     several   VITRECTOMY     Patient Active Problem List   Diagnosis Date Noted   Actinic keratosis 11/30/2023   Vitamin D  deficiency 11/30/2023    Rhinitis 05/20/2023   Cervical spondylosis 08/26/2022   CKD (chronic kidney disease) stage 4, GFR 15-29 ml/min ([name]) 05/28/2021   Adjustment disorder 01/22/2021   Chronic back pain 10/22/2020   Peripheral vertigo 05/15/2020   Dermatitis 01/04/2020   Unintentional weight loss with loose stools 05/19/2019   Low vitamin B12 level 03/29/2019   Heme positive stool 03/14/2019   Normocytic anemia 03/14/2019   Chronic diastolic heart failure ([name]) 02/08/2019   Diabetic retinopathy ([name]) 09/08/2018   Physical debility 05/24/2018   Varicose veins of both lower extremities 03/01/2018   Fatigue due to exertion 10/14/2017   GERD (gastroesophageal reflux disease) 08/25/2017   CAD in native artery 04/14/2017   Hyperlipidemia associated with type 2 diabetes mellitus ([name]) 04/14/2017   Diabetic peripheral neuropathy associated with type 2 diabetes mellitus ([name]) 08/17/2016   [name] 05/02/2016   Hypertension associated with diabetes ([name]) 05/02/2016    PCP: Jeffrey Clamp, MD   REFERRING PROVIDER: Rodney Clamp, MD   REFERRING DIAG: R53.81 (ICD-10-CM) - Physical debility   THERAPY DIAG:  Muscle weakness (generalized)  Other abnormalities of gait and mobility  Difficulty in walking, not elsewhere classified  Rationale for  Evaluation and Treatment: Rehabilitation  ONSET DATE: 2022  SUBJECTIVE:   SUBJECTIVE STATEMENT: Arrives in transport chair. I'm angry my neighbors called me a Jeffrey [name] b/c Jeffrey [name] does a lot to help me.   Brother: Jeffrey [name] identical twins  PERTINENT HISTORY: 3/11: evidence of lumbar compression deformity Compression fx L1 2022, CAD, CKD (stage 4), DM, cardiac arrest 2017, neuropathy, retinopathy Long history of gait abnormality beginning back in 2022. He has a history of CAD and believes that his debility began following a covid infection. In December odf 2022 he fell and suffered a compression fracture of L1.  PAIN:  Are you  having pain? No  PRECAUTIONS: Fall  RED FLAGS: None   WEIGHT BEARING RESTRICTIONS: No  FALLS:  Has patient fallen in last 6 months? No and with sit to stand falls back sometimes  LIVING ENVIRONMENT: Lives with: lives with their family Lives in: House/apartment Stairs: No Has following equipment at home: Single point cane, Environmental consultant - 2 wheeled, Environmental consultant - 4 wheeled, shower chair, Grab bars, and stand up walker also; also has shower chair at sink for shaving, brushing teeth etc, also has transporter.  OCCUPATION: retired  PLOF: Independent with household mobility with device  PATIENT GOALS: to walk without a walker  NEXT MD VISIT: 09/28/22  OBJECTIVE:  Note: Objective measures were completed at Evaluation unless otherwise noted.  DIAGNOSTIC FINDINGS: N/A  COGNITION: Overall cognitive status: Within functional limits for tasks assessed     SENSATION: Neuropathy in B feet   MUSCLE LENGTH: Marked HS R>L, hip flexor tightness   POSTURE: rounded shoulders and forward head  LOWER EXTREMITY ROM: WFL for tasks assessed  LOWER EXTREMITY MMT: *tested in hooklying  MMT Right eval Left eval 4/1   Hip flexion 4 4 4  Bil  Hip extension Able to bridge Able to bridge 4- Bil  Hip abduction 4-* 4-* 4- Bil  Hip adduction     Hip internal rotation     Hip external rotation     Knee flexion     Knee extension 4 4+ 4- Bil  Ankle dorsiflexion 4+ 4- 4- Bil  Ankle plantarflexion     Ankle inversion     Ankle eversion      (Blank rows = not tested)  3/20:  max UE assist to rise sit to stand; LE hip extension, knee extension grossly 3+ to 4-/5  FUNCTIONAL TESTS:  30 seconds chair stand test Timed up and go (TUG): 59.36 with 4WW Stance time: 45 sec patient reports unsteadiness and feels he goes backwards 07/27/2023 92 ft decreased cadence; decreased step length; absent heel strike  12/26: 5x STS: unable to rise without heavy UE use; attempted to use armrests for test but  does not come all the way up;  next trial with chair +cushion to touch at least one RW handle 48.2 sec:   TUG: cushion in seat +arm use and RW:  52.49 sec  08/13/23 attempted 5x STS but pt unable to complete: 1st rep does not come all the way up and then plops heavily into the transport wheelchair (he then recounts how this happened at home and how the chair tipped backwards) unable to complete reps Unable to complete TUG today, he does not have his 4 wheeled walker today and feels he is slower with the clinic RW  09/15/2023 TUG: 1:03 with rollator 5STS:48.55 sec heavy reliance on UE support; last repetition was a challenge  3/20: Heavy UE reliance with STS (unable to push up  from [facility] armrests, pulls up from // bars) 4/1: max UE assist with sit to stand  GAIT: Distance walked: 25 Assistive device utilized: Walker - 4 wheeled Level of assistance: Modified independence Comments: slow speed, decreased step and stride, decreased heel strike, forward trunk lean.  TRANSFERS: Sit to stand: Uses walker to pull up; stand to sit: does not fully back up to chair and does not reach back; Supine to sit: needs min A for log roll  (has some back pain with sit <>supine due to sitting upward from supine) also fear of falling from bed, sit to supine needs min A to lift legs on to mat table The Patient-Specific Functional Scale  Initial:  I am going to ask you to identify up to 3 important activities that you are unable to do or are having difficulty with as a result of this problem.  Today are there any activities that you are unable to do or having difficulty with because of this?  (Patient shown scale and patient rated each activity)  Follow up: When you first came in you had difficulty performing these activities.  Today do you still have difficulty?  Patient-Specific activity scoring scheme (Point to one number):  0 1 2 3 4 5 6 7 8 9  10 Unable                                                                                                           Able to perform To perform                                                                                                    activity at the same Activity         Level as before                                                                                                                       Injury or problem  Activity            Getting out of the recliner  Initial:       3                 2.                   Walking with RW 40 feet                                                                Initial:           1           3.                          Standing to brush teeth                                                           Initial:       0 (has to do sitting on shower chair)                 4. Putting on socks and shoes  while sitting                                                      initial 2-3  TODAY'S TREATMENT:  12/17/23: Gait belt on for safety: 1st round 15 min, 2nd round 10 min Standing in // bars with high knees marching Standing in // bars with multi plane and weight shifting and UE reaching  Bil UE reaching overhead to challenge static balance 5x Pelvic press forward against heavy purple power cord 10 sec holds 8x Trunk extension against heavy purple power cord 10 sec holds 7x Sit to stand from transport chair with foam cushion with decreased UE support 2 sets of 5 (cues for push from [facility] rather than pull from // bars) Gait in // bars without holding on working on arm swing and longer step length, head up, erect posture    RPE 5/10           12/15/23: Gait belt on for safety: 1st round 12 min, 2nd round 12:08, 3rd round 8 min Standing in // bars with multi plane and weight shifting and UE reaching to give "high fives" and "low fives" Bil UE reaching overhead to challenge static balance (very challenging) 2 inch step ups 5x right/left Yellow band bil rows  10x Yellow band 2 steps walk back, forward (very challenging) Side stepping, backwards, marching, long steps with double, single arm support Sit to stand from transport chair with foam cushion with decreased UE support 2 sets of 5 (cues for push from [facility] rather than pull from // bars) Gait in // bars without holding on working on arm swing      RPE 5/10           12/08/23: Gait belt on for safety Standing in // bars with multi plane and weight  shifting and UE reaching to give "high fives" and "low fives" Trunk extension isometric 15 sec holds 3x (medium purple power cord anchored by therapist)  side to side reaching to place cones Trunk rotation backwards to place cone  Side stepping, backwards, marching, long steps with double, single arm support Walking in // bars with 2# weight carry forward Sit to stand from transport chair with foam cushion with decreased UE support 10x (much better control) Gait in // bars without holding on working on arm swing                DATE:   12/01/23: Gait belt on for safety Standing in // bars with multi plane toe taps alternating Standing within // bars: teal 35# power cord around upper trunk (anchored by therapist) isometric hold of standing tall 30 sec x2 Static standing UE reaching to therapist hands in multiple planes to simulate brushing teeth and performing other ADLS at the sink Neuromuscular re-ed dynamic balance with balloon taps single and double UE use 3 rounds of walking and standing in // bars: approx 6-8 min each round 2 inch forward step ups 1 sets of 5 (2nd set with single arm support) cues in // bars to encourage longer step lengths 4 laps Foot prop on 2 inch step with UE reaches multi planes and head turns  Pt able to reach down to put down WC foot rests and put feet on rests  PATIENT EDUCATION:  Education details: PT eval findings, anticipated POC, initial HEP, postural awareness, and transfer safety  Person educated: Patient and  brother Education method: Explanation, Demonstration, and Handouts Education comprehension: verbalized understanding and returned demonstration  HOME EXERCISE PROGRAM: Access Code: [email or web address] URL: [email or web address] Date: 09/15/2023 Prepared by: Penelope Bowie  Exercises - Sit to Stand with Counter Support  - 3 x daily - 3-4 x weekly - 1 sets - 1-5 reps - Hooklying Clamshell with Resistance  - 2 x daily - 3-4 x weekly - 1 sets - 10 reps - Seated Long Arc Quad  - 1 x daily - 7 x weekly - 1-3 sets - 10 reps - 5 sec hold - Standing Hip Abduction with Counter Support  - 1 x daily - 7 x weekly - 1-2 sets - 10 reps - Standing March with Counter Support  - 1 x daily - 7 x weekly - 1-2 sets - 10 reps - Standing Heel Raise with Support  - 1 x daily - 7 x weekly - 1-2 sets - 10 reps - Shoulder External Rotation and Scapular Retraction with Resistance  - 1 x daily - 7 x weekly - 2 sets - 10 reps - Sit to Stand with Armchair  - 1 x daily - 7 x weekly - 1 sets - 5 reps  Patient Education - Tips to reduce freezing episodes with standing or walking   ASSESSMENT:  CLINICAL IMPRESSION: Dywan reports he's not feeling his best day, upset over a comment his neighbors made.  He is able to increase his standing time for 1 round to 15 minutes.  Verbal cues for erect posture, longer stride length and forward gaze.  Improvements with static balance stability with UE reaching.  Requires mod UE assist with sit to stand from 23 inch seat height.  Gait belt and CGA/close supervision for safety.     OBJECTIVE IMPAIRMENTS: Abnormal gait, decreased balance, decreased ROM, decreased strength, decreased safety awareness, dizziness, impaired flexibility, impaired sensation, postural dysfunction, and pain.   ACTIVITY LIMITATIONS:  carrying, lifting, bending, standing, squatting, stairs, transfers, bed mobility, continence, bathing, and locomotion level  PARTICIPATION LIMITATIONS: shopping and community  activity  PERSONAL FACTORS: Age, Fitness, Time since onset of injury/illness/exacerbation, and 3+ comorbidities: Compression fx L1 2022, CAD, CKD (stage 4), neuropathy, retinopathy are also affecting patient's functional outcome.   REHAB POTENTIAL: Good  CLINICAL DECISION MAKING: Unstable/unpredictable  EVALUATION COMPLEXITY: High   GOALS: Goals reviewed with patient? Yes  SHORT TERM GOALS: Target date: 07/30/23 Improved TUG to < 25 sec to decrease fall risk.  Baseline: Goal status: IN PROGRESS 09/15/2023  2.  Able to perform 5XSTS without UE assist and controlled descent showing improved LE strength.  Baseline:  Goal status: IN PROGRESS 09/15/2023  3.  Patient will be able to stand safely for 2 min without UE assist Baseline:  Goal status:IN PROGRESS 09/15/2023  LONG TERM GOALS: Target date: 01/05/2024  Ind with advanced HEP Baseline:  Goal status: IN PROGRESS 09/15/2023  2.  Sit to stand from the recliner with PSFS score of 5 Baseline:  Goal status: revised   3.  Gait with RW from the bedroom to the sunroom 40-50 feet with PSFS of 3 Baseline:  Goal status:revised   4. Improved LE mobility needed for greater ease with putting on socks and shoes in sitting with PSFS of 5 Baseline:  Goal status: revised 5.  Patient will be able to stand long enough to brush teeth PSFS of 2 Baseline:  Goal status: revised   PLAN:  PT FREQUENCY: 2x week  PT DURATION: 8 weeks  PLANNED INTERVENTIONS: 97164- PT Re-evaluation, 97110-Therapeutic exercises, 97530- Therapeutic activity, 97112- Neuromuscular re-education, 97535- Self Care, [id number]- Manual therapy, [phone]- Gait training, [phone]- Aquatic Therapy, 97014- Electrical stimulation (unattended), Patient/Family education, Balance training, Stair training, Taping, Joint mobilization, Spinal mobilization, DME instructions, Cryotherapy, and Moist heat  PLAN FOR NEXT SESSION: restart Nu-Step;  sit to stand from foam pad; static standing with teal  power cord isometric trunk extension;  UE movements while standing for ADLs; gait in // bars working on standing erect, longer step lengths;  assess energy levels and endurance ; functional strengthening; no leg press  Darien Eden, PT 12/17/23 4:11 PM Phone: [phone] Fax: [phone]

## 2023-12-22 ENCOUNTER — Ambulatory Visit: Admitting: Physical Therapy

## 2023-12-22 DIAGNOSIS — R2689 Other abnormalities of gait and mobility: Secondary | ICD-10-CM

## 2023-12-22 DIAGNOSIS — M6281 Muscle weakness (generalized): Secondary | ICD-10-CM

## 2023-12-22 DIAGNOSIS — R262 Difficulty in walking, not elsewhere classified: Secondary | ICD-10-CM

## 2023-12-22 NOTE — Therapy (Signed)
 OUTPATIENT PHYSICAL THERAPY LOWER EXTREMITY TREATMENT  Patient Name: Jeffrey [name] MRN: [id number] DOB:06/24/1956, 68 y.o., male Today's Date: 12/22/2023    END OF SESSION:  PT End of Session - 12/22/23 1403     Visit Number 29    Date for PT Re-Evaluation 01/05/24    Authorization Type HTA    Progress Note Due on Visit 30    PT Start Time 1400    PT Stop Time 1440    PT Time Calculation (min) 40 min    Equipment Utilized During Treatment Gait belt    Activity Tolerance Patient tolerated treatment well                      Past Medical History:  Diagnosis Date   Cardiac arrest ([name]) 05/22/2016   Cataract    CHF (congestive heart failure) ([name])    CKD (chronic kidney disease) stage 3, GFR 30-59 ml/min ([name]) 05/28/2021   CKD (chronic kidney disease) stage 4, GFR 15-29 ml/min ([name]) 05/28/2021   Diabetes mellitus without complication ([name])    type 2   Diabetic retinopathy ([name])    Hyperlipidemia    Hypertension    Memory loss    mild   Myocardial infarct ([name]) 2105   Retinopathy    Both eyes   S/P primary angioplasty with coronary stent 05/22/2016   Vitamin B12 deficiency    Vitreous hemorrhage of left eye ([name])    proliferative diabetic retinopathy   Past Surgical History:  Procedure Laterality Date   ANGIOPLASTY     2015   COLONOSCOPY     multiple   EYE SURGERY     PARS PLANA VITRECTOMY Left 03/15/2020   Procedure: PARS PLANA VITRECTOMY WITH 25 GAUGE;  Surgeon: Linard Reno, MD;  Location: [facility] OR;  Service: Ophthalmology;  Laterality: Left;   PHOTOCOAGULATION WITH LASER Left 03/15/2020   Procedure: PHOTOCOAGULATION WITH LASER; INTRAVITREAL INJECTION OF AVASTIN ;  Surgeon: Linard Reno, MD;  Location: [facility] OR;  Service: Ophthalmology;  Laterality: Left;   REFRACTIVE SURGERY     UPPER GI ENDOSCOPY     several   VITRECTOMY     Patient Active Problem List   Diagnosis Date Noted   Actinic keratosis 11/30/2023   Vitamin D  deficiency 11/30/2023    Rhinitis 05/20/2023   Cervical spondylosis 08/26/2022   CKD (chronic kidney disease) stage 4, GFR 15-29 ml/min ([name]) 05/28/2021   Adjustment disorder 01/22/2021   Chronic back pain 10/22/2020   Peripheral vertigo 05/15/2020   Dermatitis 01/04/2020   Unintentional weight loss with loose stools 05/19/2019   Low vitamin B12 level 03/29/2019   Heme positive stool 03/14/2019   Normocytic anemia 03/14/2019   Chronic diastolic heart failure ([name]) 02/08/2019   Diabetic retinopathy ([name]) 09/08/2018   Physical debility 05/24/2018   Varicose veins of both lower extremities 03/01/2018   Fatigue due to exertion 10/14/2017   GERD (gastroesophageal reflux disease) 08/25/2017   CAD in native artery 04/14/2017   Hyperlipidemia associated with type 2 diabetes mellitus ([name]) 04/14/2017   Diabetic peripheral neuropathy associated with type 2 diabetes mellitus ([name]) 08/17/2016   [name] 05/02/2016   Hypertension associated with diabetes ([name]) 05/02/2016    PCP: Rodney Clamp, MD   REFERRING PROVIDER: Rodney Clamp, MD   REFERRING DIAG: R53.81 (ICD-10-CM) - Physical debility   THERAPY DIAG:  Muscle weakness (generalized)  Other abnormalities of gait and mobility  Difficulty in walking, not elsewhere classified  Rationale for  Evaluation and Treatment: Rehabilitation  ONSET DATE: 2022  SUBJECTIVE:   SUBJECTIVE STATEMENT: Arrives in transport chair. Interested in doing the Nu-step as a warm up before walking.     Brother: [name] identical twins  PERTINENT HISTORY: 3/11: evidence of lumbar compression deformity Compression fx L1 2022, CAD, CKD (stage 4), DM, cardiac arrest 2017, neuropathy, retinopathy Long history of gait abnormality beginning back in 2022. He has a history of CAD and believes that his debility began following a covid infection. In December odf 2022 he fell and suffered a compression fracture of L1.  PAIN:  Are you having pain?  No  PRECAUTIONS: Fall  RED FLAGS: None   WEIGHT BEARING RESTRICTIONS: No  FALLS:  Has patient fallen in last 6 months? No and with sit to stand falls back sometimes  LIVING ENVIRONMENT: Lives with: lives with their family Lives in: House/apartment Stairs: No Has following equipment at home: Single point cane, Environmental consultant - 2 wheeled, Environmental consultant - 4 wheeled, shower chair, Grab bars, and stand up walker also; also has shower chair at sink for shaving, brushing teeth etc, also has transporter.  OCCUPATION: retired  PLOF: Independent with household mobility with device  PATIENT GOALS: to walk without a walker  NEXT MD VISIT: 09/28/22  OBJECTIVE:  Note: Objective measures were completed at Evaluation unless otherwise noted.  DIAGNOSTIC FINDINGS: N/A  COGNITION: Overall cognitive status: Within functional limits for tasks assessed     SENSATION: Neuropathy in B feet   MUSCLE LENGTH: Marked HS R>L, hip flexor tightness   POSTURE: rounded shoulders and forward head  LOWER EXTREMITY ROM: WFL for tasks assessed  LOWER EXTREMITY MMT: *tested in hooklying  MMT Right eval Left eval 4/1   Hip flexion 4 4 4  Bil  Hip extension Able to bridge Able to bridge 4- Bil  Hip abduction 4-* 4-* 4- Bil  Hip adduction     Hip internal rotation     Hip external rotation     Knee flexion     Knee extension 4 4+ 4- Bil  Ankle dorsiflexion 4+ 4- 4- Bil  Ankle plantarflexion     Ankle inversion     Ankle eversion      (Blank rows = not tested)  3/20:  max UE assist to rise sit to stand; LE hip extension, knee extension grossly 3+ to 4-/5  FUNCTIONAL TESTS:  30 seconds chair stand test Timed up and go (TUG): 59.36 with 4WW Stance time: 45 sec patient reports unsteadiness and feels he goes backwards 07/27/2023 92 ft decreased cadence; decreased step length; absent heel strike  12/26: 5x STS: unable to rise without heavy UE use; attempted to use armrests for test but does not come  all the way up;  next trial with chair +cushion to touch at least one RW handle 48.2 sec:   TUG: cushion in seat +arm use and RW:  52.49 sec  08/13/23 attempted 5x STS but pt unable to complete: 1st rep does not come all the way up and then plops heavily into the transport wheelchair (he then recounts how this happened at home and how the chair tipped backwards) unable to complete reps Unable to complete TUG today, he does not have his 4 wheeled walker today and feels he is slower with the clinic RW  09/15/2023 TUG: 1:03 with rollator 5STS:48.55 sec heavy reliance on UE support; last repetition was a challenge  3/20: Heavy UE reliance with STS (unable to push up from [facility] armrests, pulls  up from // bars) 4/1: max UE assist with sit to stand  GAIT: Distance walked: 25 Assistive device utilized: Walker - 4 wheeled Level of assistance: Modified independence Comments: slow speed, decreased step and stride, decreased heel strike, forward trunk lean.  TRANSFERS: Sit to stand: Uses walker to pull up; stand to sit: does not fully back up to chair and does not reach back; Supine to sit: needs min A for log roll  (has some back pain with sit <>supine due to sitting upward from supine) also fear of falling from bed, sit to supine needs min A to lift legs on to mat table The Patient-Specific Functional Scale  Initial:  I am going to ask you to identify up to 3 important activities that you are unable to do or are having difficulty with as a result of this problem.  Today are there any activities that you are unable to do or having difficulty with because of this?  (Patient shown scale and patient rated each activity)  Follow up: When you first came in you had difficulty performing these activities.  Today do you still have difficulty?  Patient-Specific activity scoring scheme (Point to one number):  0 1 2 3 4 5 6 7 8 9  10 Unable                                                                                                           Able to perform To perform                                                                                                    activity at the same Activity         Level as before                                                                                                                       Injury or problem  Activity            Getting out of the recliner  Initial:       3                 2.                   Walking with RW 40 feet                                                                Initial:           1           3.                          Standing to brush teeth                                                           Initial:       0 (has to do sitting on shower chair)                 4. Putting on socks and shoes  while sitting                                                      initial 2-3  TODAY'S TREATMENT:  12/22/23: Nu-step L3 5 min (CGA to transfer) 50 spm on average Gait belt on for safety: 1st round 10 min, 2nd round 10 min Standing in // bars with trunk rotation to place cones on right/left  Standing in // bars with multi plane and weight shifting, head turns and UE reaching  Bil UE reaching overhead to challenge static balance 5x Sit to stand + foam cushion 2 sets of 5 with cues to push up from armrests on chair rather than pull from // bars and control descent Gait in // bars forward and backward: working on step length and armswing Gait with 5# KB carry 2 laps right/left (unable to turn with weight, needs 2 UE assist to turn) Seated dual cable rows 7# on each side 20x  12/17/23: Gait belt on for safety: 1st round 15 min, 2nd round 10 min Standing in // bars with high knees marching Standing in // bars with multi plane and weight shifting and UE reaching  Bil UE reaching overhead to challenge static balance 5x Pelvic press forward against heavy purple power cord  10 sec holds 8x Trunk extension against heavy purple power cord 10 sec holds 7x Sit to stand from transport chair with foam cushion with decreased UE support 2 sets of 5 (cues for push from [facility] rather than pull from // bars) Gait in // bars without holding on working on arm swing and longer step length, head up, erect posture    RPE 5/10           12/15/23: Gait belt on for safety: 1st round 12 min, 2nd round 12:08, 3rd round 8  min Standing in // bars with multi plane and weight shifting and UE reaching to give "high fives" and "low fives" Bil UE reaching overhead to challenge static balance (very challenging) 2 inch step ups 5x right/left Yellow band bil rows 10x Yellow band 2 steps walk back, forward (very challenging) Side stepping, backwards, marching, long steps with double, single arm support Sit to stand from transport chair with foam cushion with decreased UE support 2 sets of 5 (cues for push from [facility] rather than pull from // bars) Gait in // bars without holding on working on arm swing      RPE 5/10           12/08/23: Gait belt on for safety Standing in // bars with multi plane and weight shifting and UE reaching to give "high fives" and "low fives" Trunk extension isometric 15 sec holds 3x (medium purple power cord anchored by therapist)  side to side reaching to place cones Trunk rotation backwards to place cone  Side stepping, backwards, marching, long steps with double, single arm support Walking in // bars with 2# weight carry forward Sit to stand from transport chair with foam cushion with decreased UE support 10x (much better control) Gait in // bars without holding on working on arm swing                DATE:   12/01/23: Gait belt on for safety Standing in // bars with multi plane toe taps alternating Standing within // bars: teal 35# power cord around upper trunk (anchored by therapist) isometric hold of standing tall 30 sec x2 Static standing UE reaching to therapist  hands in multiple planes to simulate brushing teeth and performing other ADLS at the sink Neuromuscular re-ed dynamic balance with balloon taps single and double UE use 3 rounds of walking and standing in // bars: approx 6-8 min each round 2 inch forward step ups 1 sets of 5 (2nd set with single arm support) cues in // bars to encourage longer step lengths 4 laps Foot prop on 2 inch step with UE reaches multi planes and head turns  Pt able to reach down to put down WC foot rests and put feet on rests  PATIENT EDUCATION:  Education details: PT eval findings, anticipated POC, initial HEP, postural awareness, and transfer safety  Person educated: Patient and brother Education method: Explanation, Demonstration, and Handouts Education comprehension: verbalized understanding and returned demonstration  HOME EXERCISE PROGRAM: Access Code: [email or web address] URL: [email or web address] Date: 09/15/2023 Prepared by: Penelope Bowie  Exercises - Sit to Stand with Counter Support  - 3 x daily - 3-4 x weekly - 1 sets - 1-5 reps - Hooklying Clamshell with Resistance  - 2 x daily - 3-4 x weekly - 1 sets - 10 reps - Seated Long Arc Quad  - 1 x daily - 7 x weekly - 1-3 sets - 10 reps - 5 sec hold - Standing Hip Abduction with Counter Support  - 1 x daily - 7 x weekly - 1-2 sets - 10 reps - Standing March with Counter Support  - 1 x daily - 7 x weekly - 1-2 sets - 10 reps - Standing Heel Raise with Support  - 1 x daily - 7 x weekly - 1-2 sets - 10 reps - Shoulder External Rotation and Scapular Retraction with Resistance  - 1 x daily - 7 x weekly - 2 sets - 10 reps - Sit to Stand with Armchair  - 1 x  daily - 7 x weekly - 1 sets - 5 reps  Patient Education - Tips to reduce freezing episodes with standing or walking   ASSESSMENT:  CLINICAL IMPRESSION: Able to restart the NuStep for warm up today.    He does report some increased back pain in standing today and slightly more trunk flexion and  shorter shuffled steps. Unable to turn while holding a weight in one hand secondary to the need for bil UE assist.  Improving LE strength with sit to stand but still requires heavy UE assist.  Verbal cues provided for upright posture and safe transitions with transfers and sit to stand.     OBJECTIVE IMPAIRMENTS: Abnormal gait, decreased balance, decreased ROM, decreased strength, decreased safety awareness, dizziness, impaired flexibility, impaired sensation, postural dysfunction, and pain.   ACTIVITY LIMITATIONS: carrying, lifting, bending, standing, squatting, stairs, transfers, bed mobility, continence, bathing, and locomotion level  PARTICIPATION LIMITATIONS: shopping and community activity  PERSONAL FACTORS: Age, Fitness, Time since onset of injury/illness/exacerbation, and 3+ comorbidities: Compression fx L1 2022, CAD, CKD (stage 4), neuropathy, retinopathy are also affecting patient's functional outcome.   REHAB POTENTIAL: Good  CLINICAL DECISION MAKING: Unstable/unpredictable  EVALUATION COMPLEXITY: High   GOALS: Goals reviewed with patient? Yes  SHORT TERM GOALS: Target date: 07/30/23 Improved TUG to < 25 sec to decrease fall risk.  Baseline: Goal status: IN PROGRESS 09/15/2023  2.  Able to perform 5XSTS without UE assist and controlled descent showing improved LE strength.  Baseline:  Goal status: IN PROGRESS 09/15/2023  3.  Patient will be able to stand safely for 2 min without UE assist Baseline:  Goal status:IN PROGRESS 09/15/2023  LONG TERM GOALS: Target date: 01/05/2024  Ind with advanced HEP Baseline:  Goal status: IN PROGRESS 09/15/2023  2.  Sit to stand from the recliner with PSFS score of 5 Baseline:  Goal status: revised   3.  Gait with RW from the bedroom to the sunroom 40-50 feet with PSFS of 3 Baseline:  Goal status:revised   4. Improved LE mobility needed for greater ease with putting on socks and shoes in sitting with PSFS of 5 Baseline:  Goal  status: revised 5.  Patient will be able to stand long enough to brush teeth PSFS of 2 Baseline:  Goal status: revised   PLAN:  PT FREQUENCY: 2x week  PT DURATION: 8 weeks  PLANNED INTERVENTIONS: 97164- PT Re-evaluation, 97110-Therapeutic exercises, 97530- Therapeutic activity, 97112- Neuromuscular re-education, 97535- Self Care, 14782- Manual therapy, [phone]- Gait training, [phone]- Aquatic Therapy, 97014- Electrical stimulation (unattended), Patient/Family education, Balance training, Stair training, Taping, Joint mobilization, Spinal mobilization, DME instructions, Cryotherapy, and Moist heat  PLAN FOR NEXT SESSION:  30th visit progress note; Nu-Step; dual cable rows;  sit to stand from foam pad; static standing with teal power cord isometric trunk extension;  UE movements while standing for ADLs; gait in // bars working on standing erect, longer step lengths;  assess energy levels and endurance ; functional strengthening; no leg press  Darien Eden, PT 12/22/23 5:43 PM Phone: [phone] Fax: [phone]

## 2023-12-24 ENCOUNTER — Ambulatory Visit: Admitting: Physical Therapy

## 2023-12-24 DIAGNOSIS — R2689 Other abnormalities of gait and mobility: Secondary | ICD-10-CM

## 2023-12-24 DIAGNOSIS — M6281 Muscle weakness (generalized): Secondary | ICD-10-CM | POA: Diagnosis not present

## 2023-12-24 DIAGNOSIS — R262 Difficulty in walking, not elsewhere classified: Secondary | ICD-10-CM

## 2023-12-24 NOTE — Therapy (Signed)
 OUTPATIENT PHYSICAL THERAPY LOWER EXTREMITY TREATMENT  Patient Name: Jeffrey [name] MRN: [id number] DOB:[birth year], 68 y.o., male Today's Date: 12/24/2023    END OF SESSION:  PT End of Session - 12/24/23 1401     Visit Number 30    Date for PT Re-Evaluation 01/05/24    Authorization Type HTA    Progress Note Due on Visit 40    PT Start Time 1400    PT Stop Time 1440    PT Time Calculation (min) 40 min    Equipment Utilized During Treatment Gait belt    Activity Tolerance Patient tolerated treatment well               Progress Note Reporting Period 10/29/23 to 12/24/23  See note below for Objective Data and Assessment of Progress/Goals.           Past Medical History:  Diagnosis Date   Cardiac arrest ([name]) 05/22/2016   Cataract    CHF (congestive heart failure) ([name])    CKD (chronic kidney disease) stage 3, GFR 30-59 ml/min ([name]) 05/28/2021   CKD (chronic kidney disease) stage 4, GFR 15-29 ml/min ([name]) 05/28/2021   Diabetes mellitus without complication ([name])    type 2   Diabetic retinopathy ([name])    Hyperlipidemia    Hypertension    Memory loss    mild   Myocardial infarct ([name]) 2105   Retinopathy    Both eyes   S/P primary angioplasty with coronary stent 05/22/2016   Vitamin B12 deficiency    Vitreous hemorrhage of left eye ([name])    proliferative diabetic retinopathy   Past Surgical History:  Procedure Laterality Date   ANGIOPLASTY     2015   COLONOSCOPY     multiple   EYE SURGERY     PARS PLANA VITRECTOMY Left 03/15/2020   Procedure: PARS PLANA VITRECTOMY WITH 25 GAUGE;  Surgeon: Linard Reno, MD;  Location: [facility] OR;  Service: Ophthalmology;  Laterality: Left;   PHOTOCOAGULATION WITH LASER Left 03/15/2020   Procedure: PHOTOCOAGULATION WITH LASER; INTRAVITREAL INJECTION OF AVASTIN ;  Surgeon: Linard Reno, MD;  Location: [facility] OR;  Service: Ophthalmology;  Laterality: Left;   REFRACTIVE SURGERY     UPPER GI ENDOSCOPY     several   VITRECTOMY      Patient Active Problem List   Diagnosis Date Noted   Actinic keratosis 11/30/2023   Vitamin D  deficiency 11/30/2023   Rhinitis 05/20/2023   Cervical spondylosis 08/26/2022   CKD (chronic kidney disease) stage 4, GFR 15-29 ml/min ([name]) 05/28/2021   Adjustment disorder 01/22/2021   Chronic back pain 10/22/2020   Peripheral vertigo 05/15/2020   Dermatitis 01/04/2020   Unintentional weight loss with loose stools 05/19/2019   Low vitamin B12 level 03/29/2019   Heme positive stool 03/14/2019   Normocytic anemia 03/14/2019   Chronic diastolic heart failure ([name]) 02/08/2019   Diabetic retinopathy ([name]) 09/08/2018   Physical debility 05/24/2018   Varicose veins of both lower extremities 03/01/2018   Fatigue due to exertion 10/14/2017   GERD (gastroesophageal reflux disease) 08/25/2017   CAD in native artery 04/14/2017   Hyperlipidemia associated with type 2 diabetes mellitus ([name]) 04/14/2017   Diabetic peripheral neuropathy associated with type 2 diabetes mellitus ([name]) 08/17/2016   [name] 05/02/2016   Hypertension associated with diabetes ([name]) 05/02/2016    PCP: Rodney Clamp, MD   REFERRING PROVIDER: Rodney Clamp, MD   REFERRING DIAG: R53.81 (ICD-10-CM) - Physical debility   THERAPY DIAG:  Muscle weakness (generalized)  Other abnormalities of gait and mobility  Difficulty in walking, not elsewhere classified  Rationale for Evaluation and Treatment: Rehabilitation  ONSET DATE: 2022  SUBJECTIVE:   SUBJECTIVE STATEMENT: Arrives in transport chair. States from what he remembers he did alright after last time.     Brother: [name] identical twins  PERTINENT HISTORY: 3/11: evidence of lumbar compression deformity Compression fx L1 2022, CAD, CKD (stage 4), DM, cardiac arrest 2017, neuropathy, retinopathy Long history of gait abnormality beginning back in 2022. He has a history of CAD and believes that his debility began following a  covid infection. In December odf 2022 he fell and suffered a compression fracture of L1.  PAIN:  Are you having pain? Yes "mild" right low back 3/10  PRECAUTIONS: Fall  RED FLAGS: None   WEIGHT BEARING RESTRICTIONS: No  FALLS:  Has patient fallen in last 6 months? No and with sit to stand falls back sometimes  LIVING ENVIRONMENT: Lives with: lives with their family Lives in: House/apartment Stairs: No Has following equipment at home: Single point cane, Environmental consultant - 2 wheeled, Environmental consultant - 4 wheeled, shower chair, Grab bars, and stand up walker also; also has shower chair at sink for shaving, brushing teeth etc, also has transporter.  OCCUPATION: retired  PLOF: Independent with household mobility with device  PATIENT GOALS: to walk without a walker  NEXT MD VISIT: 09/28/22  OBJECTIVE:  Note: Objective measures were completed at Evaluation unless otherwise noted.  DIAGNOSTIC FINDINGS: N/A  COGNITION: Overall cognitive status: Within functional limits for tasks assessed     SENSATION: Neuropathy in B feet   MUSCLE LENGTH: Marked HS R>L, hip flexor tightness   POSTURE: rounded shoulders and forward head  LOWER EXTREMITY ROM: WFL for tasks assessed  LOWER EXTREMITY MMT: *tested in hooklying  MMT Right eval Left eval 4/1   Hip flexion 4 4 4  Bil  Hip extension Able to bridge Able to bridge 4- Bil  Hip abduction 4-* 4-* 4- Bil  Hip adduction     Hip internal rotation     Hip external rotation     Knee flexion     Knee extension 4 4+ 4- Bil  Ankle dorsiflexion 4+ 4- 4- Bil  Ankle plantarflexion     Ankle inversion     Ankle eversion      (Blank rows = not tested)  3/20:  max UE assist to rise sit to stand; LE hip extension, knee extension grossly 3+ to 4-/5  FUNCTIONAL TESTS:  30 seconds chair stand test Timed up and go (TUG): 59.36 with 4WW Stance time: 45 sec patient reports unsteadiness and feels he goes backwards 07/27/2023 92 ft decreased cadence;  decreased step length; absent heel strike  12/26: 5x STS: unable to rise without heavy UE use; attempted to use armrests for test but does not come all the way up;  next trial with chair +cushion to touch at least one RW handle 48.2 sec:   TUG: cushion in seat +arm use and RW:  52.49 sec  08/13/23 attempted 5x STS but pt unable to complete: 1st rep does not come all the way up and then plops heavily into the transport wheelchair (he then recounts how this happened at home and how the chair tipped backwards) unable to complete reps Unable to complete TUG today, he does not have his 4 wheeled walker today and feels he is slower with the clinic RW  09/15/2023 TUG: 1:03 with rollator 5STS:48.55 sec  heavy reliance on UE support; last repetition was a challenge  3/20: Heavy UE reliance with STS (unable to push up from [facility] armrests, pulls up from // bars) 4/1: max UE assist with sit to stand  GAIT: Distance walked: 25 Assistive device utilized: Walker - 4 wheeled Level of assistance: Modified independence Comments: slow speed, decreased step and stride, decreased heel strike, forward trunk lean.  TRANSFERS: Sit to stand: Uses walker to pull up; stand to sit: does not fully back up to chair and does not reach back; Supine to sit: needs min A for log roll  (has some back pain with sit <>supine due to sitting upward from supine) also fear of falling from bed, sit to supine needs min A to lift legs on to mat table The Patient-Specific Functional Scale  Initial:  I am going to ask you to identify up to 3 important activities that you are unable to do or are having difficulty with as a result of this problem.  Today are there any activities that you are unable to do or having difficulty with because of this?  (Patient shown scale and patient rated each activity)  Follow up: When you first came in you had difficulty performing these activities.  Today do you still have difficulty?  Patient-Specific  activity scoring scheme (Point to one number):  0 1 2 3 4 5 6 7 8 9  10 Unable                                                                                                          Able to perform To perform                                                                                                    activity at the same Activity         Level as before                                                                                                                       Injury or problem  Activity  Getting out of the recliner                                                                     Initial:       3                 2.                   Walking with RW 40 feet                                                                Initial:           1           3.                          Standing to brush teeth                                                           Initial:       0 (has to do sitting on shower chair)                 4. Putting on socks and shoes  while sitting                                                      initial 2-3  TODAY'S TREATMENT:  12/24/23: Nu-step L4 5 min (SBA to transfer) 46 spm on average while discussing status Gait belt on for safety: 1st round 10 min, 2nd round 10 min Marching in place 8x Standing hip extension 5x right/left  Bil UE forward reach and lateral reach 5x each right/left Sit to stand + foam cushion 2 sets of 5 with cues to push up from armrests on chair rather than pull from // bars and control descent Standing in // bars: UE reaches to circles on bars, stool multidirectional Gait in // bars forward and backward: working on step length and armswing Seated dual cable rows 7# on each side 20x; increased to 10# on each side 10x   12/22/23: Nu-step L3 5 min (CGA to transfer) 50 spm on average Gait belt on for safety: 1st round 10 min, 2nd round 10 min Standing in // bars with trunk rotation to place cones on right/left   Standing in // bars with multi plane and weight shifting, head turns and UE reaching  Bil UE reaching overhead to challenge static balance 5x Sit to stand + foam cushion 2 sets of 5 with cues to push up from armrests on chair rather than pull from //  bars and control descent Gait in // bars forward and backward: working on step length and armswing Gait with 5# KB carry 2 laps right/left (unable to turn with weight, needs 2 UE assist to turn) Seated dual cable rows 7# on each side 20x  12/17/23: Gait belt on for safety: 1st round 15 min, 2nd round 10 min Standing in // bars with high knees marching Standing in // bars with multi plane and weight shifting and UE reaching  Bil UE reaching overhead to challenge static balance 5x Pelvic press forward against heavy purple power cord 10 sec holds 8x Trunk extension against heavy purple power cord 10 sec holds 7x Sit to stand from transport chair with foam cushion with decreased UE support 2 sets of 5 (cues for push from [facility] rather than pull from // bars) Gait in // bars without holding on working on arm swing and longer step length, head up, erect posture    RPE 5/10           12/15/23: Gait belt on for safety: 1st round 12 min, 2nd round 12:08, 3rd round 8 min Standing in // bars with multi plane and weight shifting and UE reaching to give "high fives" and "low fives" Bil UE reaching overhead to challenge static balance (very challenging) 2 inch step ups 5x right/left Yellow band bil rows 10x Yellow band 2 steps walk back, forward (very challenging) Side stepping, backwards, marching, long steps with double, single arm support Sit to stand from transport chair with foam cushion with decreased UE support 2 sets of 5 (cues for push from [facility] rather than pull from // bars) Gait in // bars without holding on working on arm swing      RPE 5/10           12/08/23: Gait belt on for safety Standing in // bars with multi plane and weight shifting and UE  reaching to give "high fives" and "low fives" Trunk extension isometric 15 sec holds 3x (medium purple power cord anchored by therapist)  side to side reaching to place cones Trunk rotation backwards to place cone  Side stepping, backwards, marching, long steps with double, single arm support Walking in // bars with 2# weight carry forward Sit to stand from transport chair with foam cushion with decreased UE support 10x (much better control) Gait in // bars without holding on working on arm swing                 PATIENT EDUCATION:  Education details: PT eval findings, anticipated POC, initial HEP, postural awareness, and transfer safety  Person educated: Patient and brother Education method: Explanation, Demonstration, and Handouts Education comprehension: verbalized understanding and returned demonstration  HOME EXERCISE PROGRAM: Access Code: [email or web address] URL: [email or web address] Date: 09/15/2023 Prepared by: Penelope Bowie  Exercises - Sit to Stand with Counter Support  - 3 x daily - 3-4 x weekly - 1 sets - 1-5 reps - Hooklying Clamshell with Resistance  - 2 x daily - 3-4 x weekly - 1 sets - 10 reps - Seated Long Arc Quad  - 1 x daily - 7 x weekly - 1-3 sets - 10 reps - 5 sec hold - Standing Hip Abduction with Counter Support  - 1 x daily - 7 x weekly - 1-2 sets - 10 reps - Standing March with Counter Support  - 1 x daily - 7 x weekly - 1-2 sets - 10 reps - Standing Heel Raise with Support  -  1 x daily - 7 x weekly - 1-2 sets - 10 reps - Shoulder External Rotation and Scapular Retraction with Resistance  - 1 x daily - 7 x weekly - 2 sets - 10 reps - Sit to Stand with Armchair  - 1 x daily - 7 x weekly - 1 sets - 5 reps  Patient Education - Tips to reduce freezing episodes with standing or walking   ASSESSMENT:  CLINICAL IMPRESSION: Although progress is slower secondary to numerous co-morbidities, the patient is showing progress with ability to stand for longer  periods of time, gait endurance with decreased reliance on Ues and improved LE strength needed for sit to stand.  After drinking 1/2 of a small cup of water, Chevelle has a lot of coughing and states this happens often.  At the conclusion of therapy his brother states he was sick prior to the PT session from overdoing it with showering etc to get ready to come to therapy.      OBJECTIVE IMPAIRMENTS: Abnormal gait, decreased balance, decreased ROM, decreased strength, decreased safety awareness, dizziness, impaired flexibility, impaired sensation, postural dysfunction, and pain.   ACTIVITY LIMITATIONS: carrying, lifting, bending, standing, squatting, stairs, transfers, bed mobility, continence, bathing, and locomotion level  PARTICIPATION LIMITATIONS: shopping and community activity  PERSONAL FACTORS: Age, Fitness, Time since onset of injury/illness/exacerbation, and 3+ comorbidities: Compression fx L1 2022, CAD, CKD (stage 4), neuropathy, retinopathy are also affecting patient's functional outcome.   REHAB POTENTIAL: Good  CLINICAL DECISION MAKING: Unstable/unpredictable  EVALUATION COMPLEXITY: High   GOALS: Goals reviewed with patient? Yes  SHORT TERM GOALS: Target date: 07/30/23 Improved TUG to < 25 sec to decrease fall risk.  Baseline: Goal status: IN PROGRESS 09/15/2023  2.  Able to perform 5XSTS without UE assist and controlled descent showing improved LE strength.  Baseline:  Goal status: IN PROGRESS 09/15/2023  3.  Patient will be able to stand safely for 2 min without UE assist Baseline:  Goal status:IN PROGRESS 09/15/2023  LONG TERM GOALS: Target date: 01/05/2024  Ind with advanced HEP Baseline:  Goal status: IN PROGRESS 09/15/2023  2.  Sit to stand from the recliner with PSFS score of 5 Baseline:  Goal status: revised   3.  Gait with RW from the bedroom to the sunroom 40-50 feet with PSFS of 3 Baseline:  Goal status:revised   4. Improved LE mobility needed for greater  ease with putting on socks and shoes in sitting with PSFS of 5 Baseline:  Goal status: revised 5.  Patient will be able to stand long enough to brush teeth PSFS of 2 Baseline:  Goal status: revised   PLAN:  PT FREQUENCY: 2x week  PT DURATION: 8 weeks  PLANNED INTERVENTIONS: 97164- PT Re-evaluation, 97110-Therapeutic exercises, 97530- Therapeutic activity, 97112- Neuromuscular re-education, 97535- Self Care, [id number]- Manual therapy, [phone]- Gait training, [phone]- Aquatic Therapy, 97014- Electrical stimulation (unattended), Patient/Family education, Balance training, Stair training, Taping, Joint mobilization, Spinal mobilization, DME instructions, Cryotherapy, and Moist heat  PLAN FOR NEXT SESSION:  asked pt and his brother to do daily walking in the hallway, try standing to brush teeth (with [name] nearby) instead of sitting;  Nu-Step; dual cable rows;  sit to stand from foam pad; static standing with teal power cord isometric trunk extension;  UE movements while standing for ADLs; gait in // bars working on standing erect, longer step lengths;  assess energy levels and endurance ; functional strengthening; no leg press  Darien Eden, PT 12/24/23 5:23 PM Phone: [phone]  Fax: [phone]

## 2023-12-29 ENCOUNTER — Emergency Department (HOSPITAL_COMMUNITY)

## 2023-12-29 ENCOUNTER — Emergency Department (HOSPITAL_COMMUNITY)
Admission: EM | Admit: 2023-12-29 | Discharge: 2023-12-29 | Disposition: A | Discharge status: Home / Self Care | Attending: Emergency Medicine | Admitting: Emergency Medicine

## 2023-12-29 ENCOUNTER — Ambulatory Visit: Admitting: Physical Therapy

## 2023-12-29 DIAGNOSIS — R935 Abnormal findings on diagnostic imaging of other abdominal regions, including retroperitoneum: Secondary | ICD-10-CM | POA: Diagnosis not present

## 2023-12-29 DIAGNOSIS — R42 Dizziness and giddiness: Secondary | ICD-10-CM | POA: Insufficient documentation

## 2023-12-29 DIAGNOSIS — R531 Weakness: Secondary | ICD-10-CM | POA: Insufficient documentation

## 2023-12-29 DIAGNOSIS — Z7982 Long term (current) use of aspirin: Secondary | ICD-10-CM | POA: Insufficient documentation

## 2023-12-29 DIAGNOSIS — R450 Nervousness: Secondary | ICD-10-CM | POA: Diagnosis not present

## 2023-12-29 DIAGNOSIS — I130 Hypertensive heart and chronic kidney disease with heart failure and stage 1 through stage 4 chronic kidney disease, or unspecified chronic kidney disease: Secondary | ICD-10-CM | POA: Diagnosis not present

## 2023-12-29 DIAGNOSIS — I2510 Atherosclerotic heart disease of native coronary artery without angina pectoris: Secondary | ICD-10-CM | POA: Diagnosis not present

## 2023-12-29 DIAGNOSIS — R519 Headache, unspecified: Secondary | ICD-10-CM | POA: Diagnosis not present

## 2023-12-29 DIAGNOSIS — I700 Atherosclerosis of aorta: Secondary | ICD-10-CM | POA: Diagnosis not present

## 2023-12-29 DIAGNOSIS — I509 Heart failure, unspecified: Secondary | ICD-10-CM | POA: Diagnosis not present

## 2023-12-29 DIAGNOSIS — R55 Syncope and collapse: Secondary | ICD-10-CM | POA: Insufficient documentation

## 2023-12-29 DIAGNOSIS — Z794 Long term (current) use of insulin: Secondary | ICD-10-CM | POA: Diagnosis not present

## 2023-12-29 DIAGNOSIS — E1122 Type 2 diabetes mellitus with diabetic chronic kidney disease: Secondary | ICD-10-CM | POA: Insufficient documentation

## 2023-12-29 DIAGNOSIS — N189 Chronic kidney disease, unspecified: Secondary | ICD-10-CM | POA: Insufficient documentation

## 2023-12-29 DIAGNOSIS — R9082 White matter disease, unspecified: Secondary | ICD-10-CM | POA: Diagnosis not present

## 2023-12-29 DIAGNOSIS — G9389 Other specified disorders of brain: Secondary | ICD-10-CM | POA: Diagnosis not present

## 2023-12-29 DIAGNOSIS — R0602 Shortness of breath: Secondary | ICD-10-CM | POA: Diagnosis not present

## 2023-12-29 LAB — URINALYSIS, ROUTINE W REFLEX MICROSCOPIC
Bilirubin Urine: NEGATIVE
Glucose, UA: NEGATIVE mg/dL
Hgb urine dipstick: NEGATIVE
Ketones, ur: NEGATIVE mg/dL
Leukocytes,Ua: NEGATIVE
Nitrite: NEGATIVE
Protein, ur: NEGATIVE mg/dL
Specific Gravity, Urine: 1.011 (ref 1.005–1.030)
pH: 5 (ref 5.0–8.0)

## 2023-12-29 LAB — COMPREHENSIVE METABOLIC PANEL WITH GFR
ALT: 37 U/L (ref 0–44)
AST: 23 U/L (ref 15–41)
Albumin: 3.7 g/dL (ref 3.5–5.0)
Alkaline Phosphatase: 52 U/L (ref 38–126)
Anion gap: 11 (ref 5–15)
BUN: 72 mg/dL — ABNORMAL HIGH (ref 8–23)
CO2: 21 mmol/L — ABNORMAL LOW (ref 22–32)
Calcium: 8.6 mg/dL — ABNORMAL LOW (ref 8.9–10.3)
Chloride: 105 mmol/L (ref 98–111)
Creatinine, Ser: 2.59 mg/dL — ABNORMAL HIGH (ref 0.61–1.24)
GFR, Estimated: 26 mL/min — ABNORMAL LOW (ref >60)
Glucose, Bld: 172 mg/dL — ABNORMAL HIGH (ref 70–99)
Potassium: 4 mmol/L (ref 3.5–5.1)
Sodium: 137 mmol/L (ref 135–145)
Total Bilirubin: 0.8 mg/dL (ref 0.0–1.2)
Total Protein: 7 g/dL (ref 6.5–8.1)

## 2023-12-29 LAB — CBC WITH DIFFERENTIAL/PLATELET
Abs Immature Granulocytes: 0.04 10*3/uL (ref 0.00–0.07)
Basophils Absolute: 0.1 10*3/uL (ref 0.0–0.1)
Basophils Relative: 0 %
Eosinophils Absolute: 0.2 10*3/uL (ref 0.0–0.5)
Eosinophils Relative: 2 %
HCT: 45 % (ref 39.0–52.0)
Hemoglobin: 15.3 g/dL (ref 13.0–17.0)
Immature Granulocytes: 0 %
Lymphocytes Relative: 14 %
Lymphs Abs: 1.6 10*3/uL (ref 0.7–4.0)
MCH: 32.2 pg (ref 26.0–34.0)
MCHC: 34 g/dL (ref 30.0–36.0)
MCV: 94.7 fL (ref 80.0–100.0)
Monocytes Absolute: 1.1 10*3/uL — ABNORMAL HIGH (ref 0.1–1.0)
Monocytes Relative: 10 %
Neutro Abs: 8.5 10*3/uL — ABNORMAL HIGH (ref 1.7–7.7)
Neutrophils Relative %: 74 %
Platelets: 356 10*3/uL (ref 150–400)
RBC: 4.75 MIL/uL (ref 4.22–5.81)
RDW: 12.7 % (ref 11.5–15.5)
WBC: 11.5 10*3/uL — ABNORMAL HIGH (ref 4.0–10.5)
nRBC: 0 % (ref 0.0–0.2)

## 2023-12-29 LAB — D-DIMER, QUANTITATIVE: D-Dimer, Quant: 0.88 ug{FEU}/mL — ABNORMAL HIGH (ref 0.00–0.50)

## 2023-12-29 LAB — TROPONIN I (HIGH SENSITIVITY)
Troponin I (High Sensitivity): 9 ng/L (ref <18)
Troponin I (High Sensitivity): 12 ng/L (ref <18)

## 2023-12-29 LAB — CBG MONITORING, ED: Glucose-Capillary: 145 mg/dL — ABNORMAL HIGH (ref 70–99)

## 2023-12-29 MED ORDER — ONDANSETRON HCL 4 MG/2ML IJ SOLN
4.0000 mg | Freq: Once | INTRAMUSCULAR | Status: AC
  Administered 2023-12-29: 4 mg via INTRAVENOUS
  Filled 2023-12-29: qty 2

## 2023-12-29 MED ORDER — SODIUM CHLORIDE 0.9 % IV BOLUS
1000.0000 mL | Freq: Once | INTRAVENOUS | Status: AC
  Administered 2023-12-29: 1000 mL via INTRAVENOUS

## 2023-12-29 MED ORDER — IOHEXOL 350 MG/ML SOLN
58.0000 mL | Freq: Once | INTRAVENOUS | Status: AC | PRN
  Administered 2023-12-29: 58 mL via INTRAVENOUS

## 2023-12-29 NOTE — ED Provider Notes (Signed)
 [place] EMERGENCY DEPARTMENT AT [facility] Provider Note   CSN: [id number] Arrival date & time: 12/29/23  4098     History  Chief Complaint  Patient presents with   Dizziness    Jeffrey [name] is a 68 y.o. male past medical history significant for diabetes, hypertension, MI, CKD, and CHF presents today for an episode of weakness, dizziness, and lightheadedness while standing to brush his teeth.  Patient also reports some mild shortness of breath with exertion, nausea, and headache.  Patient denies vomiting, chest pain, fever, chills, urinary symptoms, diarrhea, cough, congestion, or vision changes.   Dizziness Associated symptoms: headaches, nausea, shortness of breath and weakness        Home Medications Prior to Admission medications   Medication Sig Start Date End Date Taking? Authorizing Provider  acetaminophen  (TYLENOL ) 325 MG tablet Take 325-650 mg by mouth every 6 (six) hours as needed for moderate pain.    [provider]  aspirin  EC 81 MG tablet Take 81 mg by mouth daily.    [provider]  atorvastatin  (LIPITOR ) 80 MG tablet TAKE 1 TABLET BY MOUTH EVERY DAY 10/28/23   Rodney Clamp, MD  azelastine  (ASTELIN ) 0.1 % nasal spray Place 2 sprays into both nostrils 2 (two) times daily. 02/25/23   Rodney Clamp, MD  blood glucose meter kit and supplies Dispense based on patient and insurance preference. Use up to four times daily as directed. (FOR ICD-10 E10.9, E11.9). 09/23/21   Rodney Clamp, MD  Blood Glucose Monitoring Suppl (ONETOUCH VERIO IQ SYSTEM) w/Device KIT 1 kit by Does not apply route 4 (four) times daily. 03/11/18   Rodney Clamp, MD  carvedilol  (COREG ) 3.125 MG tablet TAKE 1 TABLET BY MOUTH 2 TIMES DAILY WITH MEAL 10/28/23   Rodney Clamp, MD  Continuous Glucose Receiver (FREESTYLE LIBRE 3 READER) DEVI Use as directed 10/09/23   Rodney Clamp, MD  furosemide  (LASIX ) 40 MG tablet TAKE 1 TABLET BY MOUTH EVERY DAY 10/15/23    Parker, Caleb M, MD  glucose blood (ONETOUCH VERIO) test strip USE TO CHECK BLOOD SUGAR EVERY 3 HOURS AS NEEDED. 11/30/23   Rodney Clamp, MD  insulin  aspart (NOVOLOG ) 100 UNIT/ML injection USE 3X DAILY AS NEEDED FOR HIGH BLOOD SUGARS. 3 UNITS FOR SUGAR >200, 6 UNITS >300, 9 UNITS 400+ 05/20/23   Rodney Clamp, MD  insulin  glargine (LANTUS  SOLOSTAR) 100 UNIT/ML Solostar Pen INJECT 34-50 UNITS INTO THE SKIN DAILY. 11/11/23   Rodney Clamp, MD  Insulin  Pen Needle (PEN NEEDLES) 33G X 4 MM MISC 1 application  by Does not apply route in the morning, at noon, in the evening, and at bedtime. 03/05/23   Rodney Clamp, MD  Insulin  Syringe-Needle U-100 (B-D INS SYR HALF-UNIT .3CC/31G) 31G X 5/16" 0.3 ML MISC Use 6 times a day 05/20/23   Rodney Clamp, MD  ondansetron  (ZOFRAN ) 4 MG tablet Take 1 tablet (4 mg total) by mouth every 8 (eight) hours as needed for nausea or vomiting. 09/03/23   Rodney Clamp, MD  OneTouch Delica Lancets 33G MISC Use to check blood sugar 4 times per day 05/20/23   Rodney Clamp, MD  spironolactone  (ALDACTONE ) 25 MG tablet TAKE 1 TABLET (25 MG TOTAL) BY MOUTH DAILY. 11/26/23   Rodney Clamp, MD  triamcinolone  cream (KENALOG ) 0.1 % Apply 1 application topically 2 (two) times daily. 01/04/20   Rodney Clamp, MD      Allergies  Codeine, Farxiga  [dapagliflozin ], Gabapentin , Losartan , and Penicillins    Review of Systems   Review of Systems  Respiratory:  Positive for shortness of breath.   Gastrointestinal:  Positive for nausea.  Neurological:  Positive for dizziness, weakness, light-headedness and headaches.    Physical Exam Updated Vital Signs BP 109/66   Pulse 86   Temp 97.9 F (36.6 C) (Oral)   Resp 20   Ht 5\' 8"  (1.727 m)   Wt 76.7 kg   SpO2 95%   BMI 25.71 kg/m  Physical Exam Vitals and nursing note reviewed.  Constitutional:      General: He is not in acute distress.    Appearance: Normal appearance. He is well-developed. He is not ill-appearing.   HENT:     Head: Normocephalic and atraumatic.  Eyes:     Extraocular Movements: Extraocular movements intact.     Conjunctiva/sclera: Conjunctivae normal.  Cardiovascular:     Rate and Rhythm: Normal rate and regular rhythm.     Pulses: Normal pulses.     Heart sounds: Normal heart sounds. No murmur heard. Pulmonary:     Effort: Pulmonary effort is normal. No respiratory distress.     Breath sounds: Normal breath sounds.  Abdominal:     Palpations: Abdomen is soft.     Tenderness: There is no abdominal tenderness.  Musculoskeletal:        General: No swelling.     Cervical back: Neck supple.  Skin:    General: Skin is warm and dry.     Capillary Refill: Capillary refill takes less than 2 seconds.  Neurological:     General: No focal deficit present.     Mental Status: He is alert and oriented to person, place, and time.     Motor: No weakness.  Psychiatric:        Mood and Affect: Mood normal.     Comments: Patient mildly anxious on exam     ED Results / Procedures / Treatments   Labs (all labs ordered are listed, but only abnormal results are displayed) Labs Reviewed  COMPREHENSIVE METABOLIC PANEL WITH GFR - Abnormal; Notable for the following components:      Result Value   CO2 21 (*)    Glucose, Bld 172 (*)    BUN 72 (*)    Creatinine, Ser 2.59 (*)    Calcium  8.6 (*)    GFR, Estimated 26 (*)    All other components within normal limits  CBC WITH DIFFERENTIAL/PLATELET - Abnormal; Notable for the following components:   WBC 11.5 (*)    Neutro Abs 8.5 (*)    Monocytes Absolute 1.1 (*)    All other components within normal limits  D-DIMER, QUANTITATIVE - Abnormal; Notable for the following components:   D-Dimer, Quant 0.88 (*)    All other components within normal limits  CBG MONITORING, ED - Abnormal; Notable for the following components:   Glucose-Capillary 145 (*)    All other components within normal limits  URINALYSIS, ROUTINE W REFLEX MICROSCOPIC   TROPONIN I (HIGH SENSITIVITY)  TROPONIN I (HIGH SENSITIVITY)    EKG EKG Interpretation Date/Time:  Tuesday Dec 29 2023 08:14:56 EDT Ventricular Rate:  80 PR Interval:  192 QRS Duration:  102 QT Interval:  391 QTC Calculation: 451 R Axis:   -58  Text Interpretation: Sinus rhythm Ventricular premature complex Inferior infarct, old Probable anteroseptal infarct, old Interpretation limited secondary to artifact no stemi Confirmed by Russella Courts ([id number]) on 12/29/2023 8:17:37 AM  Radiology CT Angio Chest Pulmonary Embolism (PE) W or WO Contrast Result Date: 12/29/2023 CLINICAL DATA:  Pulmonary embolism (PE) suspected, low to intermediate prob, positive D-dimer. EXAM: CT ANGIOGRAPHY CHEST WITH CONTRAST TECHNIQUE: Multidetector CT imaging of the chest was performed using the standard protocol during bolus administration of intravenous contrast. Multiplanar CT image reconstructions and MIPs were obtained to evaluate the vascular anatomy. RADIATION DOSE REDUCTION: This exam was performed according to the departmental dose-optimization program which includes automated exposure control, adjustment of the mA and/or kV according to patient size and/or use of iterative reconstruction technique. CONTRAST:  58mL OMNIPAQUE  IOHEXOL  350 MG/ML SOLN COMPARISON:  CT scan chest from 04/21/2019. FINDINGS: Cardiovascular: No evidence of embolism to the proximal subsegmental pulmonary artery level. Mild cardiomegaly. No pericardial effusion. No aortic aneurysm. There are coronary artery calcifications, in keeping with coronary artery disease. There are also moderate peripheral atherosclerotic vascular calcifications of thoracic aorta and its major branches. Mediastinum/Nodes: Visualized thyroid  gland appears grossly unremarkable. No solid / cystic mediastinal masses. The esophagus is nondistended precluding optimal assessment. Note is made of small-to-moderate sliding hiatal hernia. No axillary, mediastinal or hilar  lymphadenopathy by size criteria. Lungs/Pleura: The central tracheo-bronchial tree is patent. There are dependent changes in bilateral lungs. No mass or consolidation. No pleural effusion or pneumothorax. No suspicious lung nodules. Upper Abdomen: Multiple scattered calcified granulomas noted in the spleen, which is normal in size. There are scattered diverticula in the splenic flexure of colon without diverticulitis. Remaining visualized upper abdominal viscera within normal limits. Musculoskeletal: Bilateral mild-to-moderate symmetric gynecomastia noted. The visualized soft tissues of the chest wall are otherwise grossly unremarkable. No suspicious osseous lesions. There are mild multilevel degenerative changes in the visualized spine. Review of the MIP images confirms the above findings. IMPRESSION: 1. No embolism to the proximal subsegmental pulmonary artery level. 2. No lung mass, consolidation, pleural effusion or pneumothorax. 3. Multiple other nonacute observations, as described above. Aortic Atherosclerosis (ICD10-I70.0). Electronically Signed   By: Beula Brunswick M.D.   On: 12/29/2023 11:21   CT HEAD WO CONTRAST Result Date: 12/29/2023 CLINICAL DATA:  New onset headache EXAM: CT HEAD WITHOUT CONTRAST TECHNIQUE: Contiguous axial images were obtained from the base of the skull through the vertex without intravenous contrast. RADIATION DOSE REDUCTION: This exam was performed according to the departmental dose-optimization program which includes automated exposure control, adjustment of the mA and/or kV according to patient size and/or use of iterative reconstruction technique. COMPARISON:  05/10/2020 FINDINGS: Brain: Ventriculomegaly with callosum angle narrowing and disproportionate subarachnoid spaces. Mild low-density in the cerebral white matter, usually chronic small vessel ischemia. No acute infarct, hemorrhage, obstructive hydrocephalus, mass, or collection. Vascular: No hyperdense vessel or  unexpected calcification. Skull: Normal. Negative for fracture or focal lesion. Sinuses/Orbits: No acute finding. IMPRESSION: Chronic ventriculomegaly with features seen in normal pressure hydrocephalus, correlate for associated symptomatology. Mild chronic white matter disease. No acute finding. Electronically Signed   By: Ronnette Coke M.D.   On: 12/29/2023 10:07   DG Chest Portable 1 View Result Date: 12/29/2023 CLINICAL DATA:  Shortness of breath. EXAM: PORTABLE CHEST 1 VIEW COMPARISON:  Chest radiograph dated 09/29/2023 FINDINGS: No focal consolidation, pleural effusion, or pneumothorax. Stable cardiac silhouette. Atherosclerotic calcification of the aorta. No acute osseous pathology. IMPRESSION: No active disease. Electronically Signed   By: Angus Bark M.D.   On: 12/29/2023 09:46    Procedures Procedures    Medications Ordered in ED Medications  ondansetron  (ZOFRAN ) injection 4 mg (4 mg Intravenous Given 12/29/23 0835)  sodium chloride  0.9 % bolus 1,000 mL (0 mLs Intravenous Stopped 12/29/23 1154)  iohexol  (OMNIPAQUE ) 350 MG/ML injection 58 mL (58 mLs Intravenous Contrast Given 12/29/23 1104)    ED Course/ Medical Decision Making/ A&P                                 Medical Decision Making Amount and/or Complexity of Data Reviewed Labs: ordered. Radiology: ordered.  Risk Prescription drug management.   This patient presents to the ED for concern of dizziness, this involves an extensive number of treatment options, and is a complaint that carries with it a high risk of complications and morbidity.  The differential diagnosis includes hypoglycemia, STEMI, NSTEMI, vasovagal syncope, electrolyte abnormality, PE, anemia, UTI   Co morbidities that complicate the patient evaluation  CHF, history of STEMI, CKD, diabetes   Additional history obtained:  Additional history obtained from family External records from outside source obtained and reviewed including family  medicine progress notes   Lab Tests:  I Ordered, and personally interpreted labs.  The pertinent results include: Mild leukocytosis at 11.5, D-dimer 0.88, elevated BUN and creatinine consistent with CKD   Imaging Studies ordered:  I ordered imaging studies including head CT Noncon, chest x-ray, CTA PE I independently visualized and interpreted imaging which showed Chronic ventriculomegaly with features seen in normal pressure hydrocephalus, correlate for associated symptomatology.  No acute findings.  CTA which showed no embolism, lung mass, consolidation, pleural effusion, or pneumothorax. I agree with the radiologist interpretation   Cardiac Monitoring: / EKG:  The patient was maintained on a cardiac monitor.  I personally viewed and interpreted the cardiac monitored which showed an underlying rhythm of: Sinus rhythm with PVC   Problem List / ED Course / Critical interventions / Medication management  I ordered medication including Zofran  for nausea, IVF, Reevaluation of the patient after these medicines showed that the patient improved I have reviewed the patients home medicines and have made adjustments as needed   Consultations Obtained:  Consulted radiology, Dr. Radparvar about PE study given that the patient's GFR is 26.  He was okay with moving forward with the study and suggest we continue to give the patient IV fluids once he returns from CT.  Test / Admission - Considered:  Considered for admission or further workup however patient's vital signs, physical exam, labs, imaging been reassuring.  Patient has been referred to neurology for further evaluation for possible normal pressure hydrocephalus.  Patient's symptoms could be due to this or possible vasovagal reaction.  Patient given return precautions.  I feel patient is safe for discharge at this time.        Final Clinical Impression(s) / ED Diagnoses Final diagnoses:  Dizziness  Near syncope    Rx / DC  Orders ED Discharge Orders          Ordered    Ambulatory referral to Neurology       Comments: An appointment is requested in approximately: 1 week   Pending              Merryl Abraham 12/29/23 1451    Russella Courts A, DO 12/31/23 [phone]

## 2023-12-29 NOTE — ED Triage Notes (Signed)
 Pt BIBEMS from home, standing to brush teeth became weak. Dizzy and lightheaded, SOB with exertion. No difficulty urinating, clear urine at bedside at home. Denies any pain in legs, notes physical therapy yesterday in the home. Hx of stage 4 CKD. Denies any pain. Slight headache.  Cardiac hx, 12 lead unremarkable 143 CBG 146/80 85 HR 97% O2 20G in R hand

## 2023-12-29 NOTE — ED Notes (Signed)
 Back from CT

## 2023-12-29 NOTE — Discharge Instructions (Addendum)
 Today you were seen for an episode of dizziness and possible near syncope.  You had a finding on your head CT which could be consistent with normal pressure hydrocephalus and requires follow-up with neurology.  Please return to the ED if you have sudden onset of double vision, uncontrollable vomiting, or worsening symptoms.  Thank you for letting us  treat you today. After reviewing your labs and imaging, I feel you are safe to go home. Please follow up with your PCP in the next several days and provide them with your records from this visit. Return to the Emergency Room if pain becomes severe or symptoms worsen.

## 2023-12-31 ENCOUNTER — Encounter: Payer: Self-pay | Admitting: Family Medicine

## 2023-12-31 ENCOUNTER — Ambulatory Visit (INDEPENDENT_AMBULATORY_CARE_PROVIDER_SITE_OTHER): Admitting: Family Medicine

## 2023-12-31 ENCOUNTER — Ambulatory Visit: Admitting: Physical Therapy

## 2023-12-31 VITALS — BP 112/67 | HR 71 | Temp 97.3°F | Ht 68.0 in | Wt 156.0 lb

## 2023-12-31 DIAGNOSIS — E1159 Type 2 diabetes mellitus with other circulatory complications: Secondary | ICD-10-CM

## 2023-12-31 DIAGNOSIS — F481 Depersonalization-derealization syndrome: Secondary | ICD-10-CM | POA: Diagnosis not present

## 2023-12-31 DIAGNOSIS — G919 Hydrocephalus, unspecified: Secondary | ICD-10-CM

## 2023-12-31 DIAGNOSIS — G4730 Sleep apnea, unspecified: Secondary | ICD-10-CM | POA: Insufficient documentation

## 2023-12-31 DIAGNOSIS — I152 Hypertension secondary to endocrine disorders: Secondary | ICD-10-CM | POA: Diagnosis not present

## 2023-12-31 HISTORY — DX: Sleep apnea, unspecified: G47.30

## 2023-12-31 NOTE — Assessment & Plan Note (Signed)
 Patient was diagnosed with OSA many years ago while in [place] .  He would like to avoid CPAP however does have frequent coughing episodes at night.  He would like to try nocturnal oxygen to see if this helps with the symptoms.  Will send prescription for this today however did discuss with patient that we will likely have to do further testing including potentially repeating sleep study.

## 2023-12-31 NOTE — Progress Notes (Signed)
   Jeffrey [name] is a 68 y.o. male who presents today for an office visit.  Assessment/Plan:  New/Acute Problems: Depersonalization  He has any recurrences of his "out of body" symptoms since his visit to the Emergency Department. Work up in the Emergency Department was notable for CT scan that showed chronic ventriculomegaly concerning for NPH. We did discuss having hime see neurology however he would like to hold off on this for now.  Unclear etiology for symptoms though may be related to sleep deprivation from underlying potential OSA.  See below problem for this.  Will obtain MRI for further evaluation also to further evaluate his concern for NPH on his CT scan.  Depending on results of MRI may need referral to see neurology.  We discussed reasons to return to care.  Chronic Problems Addressed Today: Sleep-disordered breathing Patient was diagnosed with OSA many years ago while in [place] .  He would like to avoid CPAP however does have frequent coughing episodes at night.  He would like to try nocturnal oxygen to see if this helps with the symptoms.  Will send prescription for this today however did discuss with patient that we will likely have to do further testing including potentially repeating sleep study.  Hypertension associated with diabetes ([name]) Blood pressure is at goal today.  Will recheck labs today per patient request.     Subjective:  HPI:  See Assessment / plan for status of chronic conditions. Patient here for Emergency Department follow up.  Per his Emergency Department note, he went to the ED 2 days ago with weakness, lightheadedness, and dizziness while standing to brush his teeth.  In the ED he also had some shortness of breath with exertion as well as nausea and headache.  On the ED he had extensive workup including labs which was notable for positive D-dimer.  His CTA was negative for pulmonary embolism.  He also had a CT head scan which showed chronic  ventriculomegaly.  The remainder of his labs including EKG and delta troponin were negative.  He was discharged home and referred to neurology for further evaluation for possible normal pressure hydrocephalus.  Patient tells me today that is symptoms were more like an "out of body experience." Patient denies feeling dizzy or like he was going to pass out. He felt like his body frozen. No convulsions. Over the last couple of days, symptoms have improving slowly. He is still feeling sluggish but is getting back to normal.  He does note that he is having ongoing issues with waking up frequently and having a hard time getting back to sleep.        Objective:  Physical Exam: BP 112/67   Pulse 71   Temp (!) 97.3 F (36.3 C) (Temporal)   Ht 5\' 8"  (1.727 m)   Wt 156 lb (70.8 kg)   SpO2 96%   BMI 23.72 kg/m   Gen: No acute distress, resting comfortably Neuro: Grossly normal, moves all extremities Psych: Normal affect and thought content  Time Spent: 50 minutes of total time was spent on the date of the encounter performing the following actions: chart review prior to seeing the patient including recent ED visit, obtaining history, performing a medically necessary exam, counseling on the treatment plan, placing orders, and documenting in our EHR.        Jinny Mounts. Daneil Dunker, MD 12/31/2023 3:31 PM

## 2023-12-31 NOTE — Assessment & Plan Note (Signed)
 Blood pressure is at goal today.  Will recheck labs today per patient request.

## 2023-12-31 NOTE — Patient Instructions (Signed)
 It was very nice to see you today!  We will check blood work today.  Will order an MRI.  Let us  know if any recurrent symptoms.  Return if symptoms worsen or fail to improve.   Take care, Dr Daneil Dunker  PLEASE NOTE:  If you had any lab tests, please let us  know if you have not heard back within a few days. You may see your results on mychart before we have a chance to review them but we will give you a call once they are reviewed by us .   If we ordered any referrals today, please let us  know if you have not heard from their office within the next week.   If you had any urgent prescriptions sent in today, please check with the pharmacy within an hour of our visit to make sure the prescription was transmitted appropriately.   Please try these tips to maintain a healthy lifestyle:  Eat at least 3 REAL meals and 1-2 snacks per day.  Aim for no more than 5 hours between eating.  If you eat breakfast, please do so within one hour of getting up.   Each meal should contain half fruits/vegetables, one quarter protein, and one quarter carbs (no bigger than a computer mouse)  Cut down on sweet beverages. This includes juice, soda, and sweet tea.   Drink at least 1 glass of water with each meal and aim for at least 8 glasses per day  Exercise at least 150 minutes every week.

## 2024-01-01 ENCOUNTER — Encounter: Payer: Self-pay | Admitting: Family Medicine

## 2024-01-01 ENCOUNTER — Ambulatory Visit: Payer: Self-pay | Admitting: Family Medicine

## 2024-01-01 ENCOUNTER — Other Ambulatory Visit: Payer: Self-pay | Admitting: *Deleted

## 2024-01-01 ENCOUNTER — Telehealth: Payer: Self-pay

## 2024-01-01 DIAGNOSIS — R9089 Other abnormal findings on diagnostic imaging of central nervous system: Secondary | ICD-10-CM

## 2024-01-01 DIAGNOSIS — G4730 Sleep apnea, unspecified: Secondary | ICD-10-CM

## 2024-01-01 LAB — COMPREHENSIVE METABOLIC PANEL WITH GFR
ALT: 33 U/L (ref 0–53)
AST: 22 U/L (ref 0–37)
Albumin: 4.3 g/dL (ref 3.5–5.2)
Alkaline Phosphatase: 60 U/L (ref 39–117)
BUN: 50 mg/dL — ABNORMAL HIGH (ref 6–23)
CO2: 27 meq/L (ref 19–32)
Calcium: 9.1 mg/dL (ref 8.4–10.5)
Chloride: 105 meq/L (ref 96–112)
Creatinine, Ser: 2.21 mg/dL — ABNORMAL HIGH (ref 0.40–1.50)
GFR: 29.73 mL/min — ABNORMAL LOW (ref >60.00)
Glucose, Bld: 107 mg/dL — ABNORMAL HIGH (ref 70–99)
Potassium: 4.1 meq/L (ref 3.5–5.1)
Sodium: 140 meq/L (ref 135–145)
Total Bilirubin: 0.5 mg/dL (ref 0.2–1.2)
Total Protein: 7.5 g/dL (ref 6.0–8.3)

## 2024-01-01 NOTE — Progress Notes (Signed)
 BUN and creatinine are back to his baseline.  We can recheck at next office visit.

## 2024-01-01 NOTE — Telephone Encounter (Signed)
 Please inform patient. We can can refer for sleep testing also if they wish.  Jeffrey [name]. Daneil Dunker, MD 01/01/2024 11:35 AM

## 2024-01-01 NOTE — Telephone Encounter (Signed)
 Patient agreed with referral  Referral placed

## 2024-01-01 NOTE — Telephone Encounter (Signed)
 Copied from CRM [id number]. Topic: Clinical - Prescription Issue >> Jan 01, 2024  8:47 AM Jeffrey [name] wrote: Reason for CRM: Terri from [facility] called stating they received an order for nighttime oxygen for the patient which they are unable to deliver at this time. Terri stated that patient's insurance will not cover nocturnal oxygen with an OSA diagnosis, but she stated that they would be able to do a CPAP machine if that is something the patient/provider is interested in. Callback number for Jeffrey [name] is [phone] Option 1.  Please see phone note from [facility] stating insurance not approving nocturnal O2, however will do a CPAP machine. Advise recommendations for patient

## 2024-01-05 ENCOUNTER — Ambulatory Visit: Admitting: Physical Therapy

## 2024-01-05 DIAGNOSIS — M6281 Muscle weakness (generalized): Secondary | ICD-10-CM

## 2024-01-05 DIAGNOSIS — R262 Difficulty in walking, not elsewhere classified: Secondary | ICD-10-CM

## 2024-01-05 DIAGNOSIS — R2689 Other abnormalities of gait and mobility: Secondary | ICD-10-CM

## 2024-01-05 NOTE — Therapy (Signed)
 OUTPATIENT PHYSICAL THERAPY LOWER EXTREMITY TREATMENT/RECERTIFICATION  Patient Name: Jeffrey [name] MRN: [id number] DOB:September 23, 1955, 68 y.o., male Today's Date: 01/05/2024    END OF SESSION:  PT End of Session - 01/05/24 1446     Visit Number 31    Date for PT Re-Evaluation 03/01/24    Authorization Type HTA    Progress Note Due on Visit 40    PT Start Time 1445    PT Stop Time 1530    PT Time Calculation (min) 45 min    Equipment Utilized During Treatment Gait belt                 Past Medical History:  Diagnosis Date   Cardiac arrest ([name]) 05/22/2016   Cataract    CHF (congestive heart failure) ([name])    CKD (chronic kidney disease) stage 3, GFR 30-59 ml/min ([name]) 05/28/2021   CKD (chronic kidney disease) stage 4, GFR 15-29 ml/min ([name]) 05/28/2021   Diabetes mellitus without complication ([name])    type 2   Diabetic retinopathy ([name])    Hyperlipidemia    Hypertension    Memory loss    mild   Myocardial infarct ([name]) 2105   Retinopathy    Both eyes   S/P primary angioplasty with coronary stent 05/22/2016   Sleep-disordered breathing 12/31/2023   Vitamin B12 deficiency    Vitreous hemorrhage of left eye ([name])    proliferative diabetic retinopathy   Past Surgical History:  Procedure Laterality Date   ANGIOPLASTY     2015   COLONOSCOPY     multiple   EYE SURGERY     PARS PLANA VITRECTOMY Left 03/15/2020   Procedure: PARS PLANA VITRECTOMY WITH 25 GAUGE;  Surgeon: Linard Reno, MD;  Location: [facility] OR;  Service: Ophthalmology;  Laterality: Left;   PHOTOCOAGULATION WITH LASER Left 03/15/2020   Procedure: PHOTOCOAGULATION WITH LASER; INTRAVITREAL INJECTION OF AVASTIN ;  Surgeon: Linard Reno, MD;  Location: [facility];  Service: Ophthalmology;  Laterality: Left;   REFRACTIVE SURGERY     UPPER GI ENDOSCOPY     several   VITRECTOMY     Patient Active Problem List   Diagnosis Date Noted   Sleep-disordered breathing 12/31/2023   Actinic keratosis 11/30/2023   Vitamin D   deficiency 11/30/2023   Rhinitis 05/20/2023   Cervical spondylosis 08/26/2022   CKD (chronic kidney disease) stage 4, GFR 15-29 ml/min ([name]) 05/28/2021   Adjustment disorder 01/22/2021   Chronic back pain 10/22/2020   Peripheral vertigo 05/15/2020   Dermatitis 01/04/2020   Unintentional weight loss with loose stools 05/19/2019   Low vitamin B12 level 03/29/2019   Heme positive stool 03/14/2019   Normocytic anemia 03/14/2019   Chronic diastolic heart failure ([name]) 02/08/2019   Diabetic retinopathy ([name]) 09/08/2018   Physical debility 05/24/2018   Varicose veins of both lower extremities 03/01/2018   Fatigue due to exertion 10/14/2017   GERD (gastroesophageal reflux disease) 08/25/2017   CAD in native artery 04/14/2017   Hyperlipidemia associated with type 2 diabetes mellitus ([name]) 04/14/2017   Diabetic peripheral neuropathy associated with type 2 diabetes mellitus ([name]) 08/17/2016   [name] 05/02/2016   Hypertension associated with diabetes ([name]) 05/02/2016    PCP: Rodney Clamp, MD   REFERRING PROVIDER: Rodney Clamp, MD   REFERRING DIAG: R53.81 (ICD-10-CM) - Physical debility   THERAPY DIAG:  Muscle weakness (generalized)  Other abnormalities of gait and mobility  Difficulty in walking, not elsewhere classified  Rationale for Evaluation and Treatment: Rehabilitation  ONSET DATE: 2022  SUBJECTIVE:   SUBJECTIVE STATEMENT: Arrives in transport chair.  Patient returns after missing last week secondary to ED visit.  He had extensive testing particularly for his heart which showed no change since cardiac stents 10 years ago.  He was told he has fluid on the brain and will have an MRI in early June.  He was able to stand to brush his teeth.    Brother: Nadean [name] Jeffrey twins  PERTINENT HISTORY: 3/11: evidence of lumbar compression deformity Compression fx L1 2022, CAD, CKD (stage 4), DM, cardiac arrest 2017, neuropathy, retinopathy Long  history of gait abnormality beginning back in 2022. He has a history of CAD and believes that his debility began following a covid infection. In December odf 2022 he fell and suffered a compression fracture of L1.  PAIN:  Are you having pain? no  PRECAUTIONS: Fall  RED FLAGS: None   WEIGHT BEARING RESTRICTIONS: No  FALLS:  Has patient fallen in last 6 months? No and with sit to stand falls back sometimes  LIVING ENVIRONMENT: Lives with: lives with their family Lives in: House/apartment Stairs: No Has following equipment at home: Single point cane, Environmental consultant - 2 wheeled, Environmental consultant - 4 wheeled, shower chair, Grab bars, and stand up walker also; also has shower chair at sink for shaving, brushing teeth etc, also has transporter.  OCCUPATION: retired  PLOF: Independent with household mobility with device  PATIENT GOALS: to walk without a walker  NEXT MD VISIT: 09/28/22  OBJECTIVE:  Note: Objective measures were completed at Evaluation unless otherwise noted.  DIAGNOSTIC FINDINGS: N/A  COGNITION: Overall cognitive status: Within functional limits for tasks assessed     SENSATION: Neuropathy in B feet   MUSCLE LENGTH: Marked HS R>L, hip flexor tightness   POSTURE: rounded shoulders and forward head  LOWER EXTREMITY ROM: WFL for tasks assessed  LOWER EXTREMITY MMT: *tested in hooklying  MMT Right eval Left eval 4/1  5/27  Hip flexion 4 4 4  Bil 4 Bil  Hip extension Able to bridge Able to bridge 4- Bil 4- Bil  Hip abduction 4-* 4-* 4- Bil 4- Bil  Hip adduction      Hip internal rotation      Hip external rotation      Knee flexion      Knee extension 4 4+ 4- Bil 4- Bil  Ankle dorsiflexion 4+ 4- 4- Bil 4- Bil  Ankle plantarflexion      Ankle inversion      Ankle eversion       (Blank rows = not tested)  3/20:  max UE assist to rise sit to stand; LE hip extension, knee extension grossly 3+ to 4-/5  FUNCTIONAL TESTS:  30 seconds chair stand test Timed up and go  (TUG): 59.36 with 4WW Stance time: 45 sec patient reports unsteadiness and feels he goes backwards 07/27/2023 92 ft decreased cadence; decreased step length; absent heel strike  12/26: 5x STS: unable to rise without heavy UE use; attempted to use armrests for test but does not come all the way up;  next trial with chair +cushion to touch at least one RW handle 48.2 sec:   TUG: cushion in seat +arm use and RW:  52.49 sec  08/13/23 attempted 5x STS but pt unable to complete: 1st rep does not come all the way up and then plops heavily into the transport wheelchair (he then recounts how this happened at home and how the chair tipped backwards) unable  to complete reps Unable to complete TUG today, he does not have his 4 wheeled walker today and feels he is slower with the clinic RW  09/15/2023 TUG: 1:03 with rollator 5STS:48.55 sec heavy reliance on UE support; last repetition was a challenge  3/20: Heavy UE reliance with STS (unable to push up from [facility] armrests, pulls up from // bars) 4/1: max UE assist with sit to stand  5/27:  5x STS no cushion: able to complete 4 reps 47.19 sec            5x STS with cushion 22.5 seat height 5x 43.09 sec able to push up from [facility] armrests rather than // bars (much less reliance on Ues to rise)  GAIT: Distance walked: 25 Assistive device utilized: Walker - 4 wheeled Level of assistance: Modified independence Comments: slow speed, decreased step and stride, decreased heel strike, forward trunk lean.  TRANSFERS: Sit to stand: Uses walker to pull up; stand to sit: does not fully back up to chair and does not reach back; Supine to sit: needs min A for log roll  (has some back pain with sit <>supine due to sitting upward from supine) also fear of falling from bed, sit to supine needs min A to lift legs on to mat table The Patient-Specific Functional Scale  Initial:  I am going to ask you to identify up to 3 important activities that you are unable to do or are  having difficulty with as a result of this problem.  Today are there any activities that you are unable to do or having difficulty with because of this?  (Patient shown scale and patient rated each activity)  Follow up: When you first came in you had difficulty performing these activities.  Today do you still have difficulty?  Patient-Specific activity scoring scheme (Point to one number):  0 1 2 3 4 5 6 7 8 9  10 Unable                                                                                                          Able to perform To perform                                                                                                    activity at the same Activity         Level as before  Injury or problem  Activity            Getting out of the recliner                                                                     Initial:       3                5/27:  4 2.                   Walking with RW 40 feet                                                                Initial:           1              3.                          Standing to brush teeth                                                           Initial:       0 (has to do sitting on shower chair)     5/27: 3            4. Putting on socks and shoes  while sitting                                                      initial 2-3  TODAY'S TREATMENT:  01/05/24: Status update following medical event last week Gait belt on for safety: 1st round 10 min, 2nd round 5 min 5x STS 2 trials: without cushion and with cushion Inside // bars with bil UE reaches overhead 5x Inside // bars with head turns 5x  Marching in place inside // bars 8x Gait in // bars forward and backward: working on step length and armswing 2 rounds Seated dual cable rows  10# on each side 10x   12/24/23: Nu-step L4 5 min (SBA to  transfer) 46 spm on average while discussing status Gait belt on for safety: 1st round 10 min, 2nd round 10 min Marching in place 8x Standing hip extension 5x right/left  Bil UE forward reach and lateral reach 5x each right/left Sit to stand + foam cushion 2 sets of 5 with cues to push up from armrests on chair rather than pull from // bars and control descent Standing in // bars: UE reaches to circles on bars, stool multidirectional Gait in // bars forward and backward: working on step length and armswing Seated dual  cable rows 7# on each side 20x; increased to 10# on each side 10x   12/22/23: Nu-step L3 5 min (CGA to transfer) 50 spm on average Gait belt on for safety: 1st round 10 min, 2nd round 10 min Standing in // bars with trunk rotation to place cones on right/left  Standing in // bars with multi plane and weight shifting, head turns and UE reaching  Bil UE reaching overhead to challenge static balance 5x Sit to stand + foam cushion 2 sets of 5 with cues to push up from armrests on chair rather than pull from // bars and control descent Gait in // bars forward and backward: working on step length and armswing Gait with 5# KB carry 2 laps right/left (unable to turn with weight, needs 2 UE assist to turn) Seated dual cable rows 7# on each side 20x  12/17/23: Gait belt on for safety: 1st round 15 min, 2nd round 10 min Standing in // bars with high knees marching Standing in // bars with multi plane and weight shifting and UE reaching  Bil UE reaching overhead to challenge static balance 5x Pelvic press forward against heavy purple power cord 10 sec holds 8x Trunk extension against heavy purple power cord 10 sec holds 7x Sit to stand from transport chair with foam cushion with decreased UE support 2 sets of 5 (cues for push from [facility] rather than pull from // bars) Gait in // bars without holding on working on arm swing and longer step length, head up, erect posture    RPE 5/10            PATIENT EDUCATION:  Education details: PT eval findings, anticipated POC, initial HEP, postural awareness, and transfer safety  Person educated: Patient and brother Education method: Explanation, Demonstration, and Handouts Education comprehension: verbalized understanding and returned demonstration  HOME EXERCISE PROGRAM: Access Code: [email or web address] URL: [email or web address] Date: 09/15/2023 Prepared by: Penelope Bowie  Exercises - Sit to Stand with Counter Support  - 3 x daily - 3-4 x weekly - 1 sets - 1-5 reps - Hooklying Clamshell with Resistance  - 2 x daily - 3-4 x weekly - 1 sets - 10 reps - Seated Long Arc Quad  - 1 x daily - 7 x weekly - 1-3 sets - 10 reps - 5 sec hold - Standing Hip Abduction with Counter Support  - 1 x daily - 7 x weekly - 1-2 sets - 10 reps - Standing March with Counter Support  - 1 x daily - 7 x weekly - 1-2 sets - 10 reps - Standing Heel Raise with Support  - 1 x daily - 7 x weekly - 1-2 sets - 10 reps - Shoulder External Rotation and Scapular Retraction with Resistance  - 1 x daily - 7 x weekly - 2 sets - 10 reps - Sit to Stand with Armchair  - 1 x daily - 7 x weekly - 1 sets - 5 reps  Patient Education - Tips to reduce freezing episodes with standing or walking   ASSESSMENT:  CLINICAL IMPRESSION: Antwon returns after a medical event and overnight stay in the ED.  No new findings with his heart and he will undergo brain imaging to assess fluid on the brain.  Overall progress has been slower secondary to numerous co-morbidities and variable medical status at times.  LE strength has improved with less UE reliance with standing.  He has begun standing to brush his teeth.  Improving LE strength also noted  with transitions from sit to stand.  He can push up from the armrests of the wheelchair instead of pulling himself up with his arms.  He is able to to be on his feet (standing or walking) for > 10 minutes at a time.  Without PT he would likely  decline in functional status and mobility, therefore he would benefit from PT in order to remain living at home with his brother and increase independence with grooming, dressing and walking.    OBJECTIVE IMPAIRMENTS: Abnormal gait, decreased balance, decreased ROM, decreased strength, decreased safety awareness, dizziness, impaired flexibility, impaired sensation, postural dysfunction, and pain.   ACTIVITY LIMITATIONS: carrying, lifting, bending, standing, squatting, stairs, transfers, bed mobility, continence, bathing, and locomotion level  PARTICIPATION LIMITATIONS: shopping and community activity  PERSONAL FACTORS: Age, Fitness, Time since onset of injury/illness/exacerbation, and 3+ comorbidities: Compression fx L1 2022, CAD, CKD (stage 4), neuropathy, retinopathy are also affecting patient's functional outcome.   REHAB POTENTIAL: Good  CLINICAL DECISION MAKING: Unstable/unpredictable  EVALUATION COMPLEXITY: High   GOALS: Goals reviewed with patient? Yes  SHORT TERM GOALS: Target date: 07/30/23 Improved TUG to < 25 sec to decrease fall risk.  Baseline: Goal status: deferred  2.  Able to perform 5XSTS without UE assist and controlled descent showing improved LE strength.  Baseline:  Goal status: deferred 3.  Patient will be able to stand safely for 2 min without UE assist Baseline:  Goal status: met 5/27  LONG TERM GOALS: Target date: 03/01/2024   Ind with advanced HEP Baseline:  Goal status: IN PROGRESS   2.  Sit to stand from the recliner with PSFS score of 5 Baseline:  Goal status: partially met   3.  Gait with RW from the bedroom to the sunroom 40-50 feet with PSFS of 3 Baseline:  Goal status: ongoing  4. Improved LE mobility needed for greater ease with putting on socks and shoes in sitting with PSFS of 5 Baseline:  Goal status: ongoing  5.  Patient will be able to stand long enough to brush teeth PSFS of 2 Baseline:  Goal status: met 5/27  6. Able to  rise from 22 inch seat height push from the armrests to use the new chairs in the living room (in July) New  7. Patient will be able to stand/walk for 13 minutes needed fro grooming and dressing tasks at home   PLAN:  PT FREQUENCY: 2x week  PT DURATION: 8 weeks  PLANNED INTERVENTIONS: 97164- PT Re-evaluation, 97110-Therapeutic exercises, 97530- Therapeutic activity, 97112- Neuromuscular re-education, 97535- Self Care, 16109- Manual therapy, [phone]- Gait training, [phone]- Aquatic Therapy, 97014- Electrical stimulation (unattended), Patient/Family education, Balance training, Stair training, Taping, Joint mobilization, Spinal mobilization, DME instructions, Cryotherapy, and Moist heat  PLAN FOR NEXT SESSION: Nu-Step; dual cable rows;  sit to stand from foam pad; static standing with teal power cord isometric trunk extension;  UE movements while standing for ADLs; gait in // bars working on standing erect, longer step lengths;  assess energy levels and endurance ; functional strengthening; no leg press  Darien Eden, PT 01/05/24 6:42 PM Phone: [phone] Fax: [phone]

## 2024-01-07 ENCOUNTER — Ambulatory Visit: Admitting: Physical Therapy

## 2024-01-07 DIAGNOSIS — R2689 Other abnormalities of gait and mobility: Secondary | ICD-10-CM

## 2024-01-07 DIAGNOSIS — M6281 Muscle weakness (generalized): Secondary | ICD-10-CM

## 2024-01-07 DIAGNOSIS — R262 Difficulty in walking, not elsewhere classified: Secondary | ICD-10-CM

## 2024-01-07 NOTE — Telephone Encounter (Signed)
 Copied from CRM [id number]. Topic: General - Other >> Jan 07, 2024 11:39 AM [place] A wrote: Reason for CRM: Dr.Bahandari the [facility] office called and patients brother would like for lab results to be faxed over to office [phone]

## 2024-01-07 NOTE — Therapy (Signed)
 OUTPATIENT PHYSICAL THERAPY LOWER EXTREMITY TREATMENT  Patient Name: Jeffrey [name] MRN: [id number] DOB:1955/10/21, 68 y.o., male Today's Date: 01/07/2024    END OF SESSION:  PT End of Session - 01/07/24 1451     Visit Number 32    Date for PT Re-Evaluation 03/01/24    Authorization Type HTA    Progress Note Due on Visit 40    PT Start Time 1448    PT Stop Time 1529    PT Time Calculation (min) 41 min    Equipment Utilized During Treatment Gait belt    Activity Tolerance Patient tolerated treatment well                 Past Medical History:  Diagnosis Date   Cardiac arrest ([name]) 05/22/2016   Cataract    CHF (congestive heart failure) ([name])    CKD (chronic kidney disease) stage 3, GFR 30-59 ml/min ([name]) 05/28/2021   CKD (chronic kidney disease) stage 4, GFR 15-29 ml/min ([name]) 05/28/2021   Diabetes mellitus without complication ([name])    type 2   Diabetic retinopathy ([name])    Hyperlipidemia    Hypertension    Memory loss    mild   Myocardial infarct ([name]) 2105   Retinopathy    Both eyes   S/P primary angioplasty with coronary stent 05/22/2016   Sleep-disordered breathing 12/31/2023   Vitamin B12 deficiency    Vitreous hemorrhage of left eye ([name])    proliferative diabetic retinopathy   Past Surgical History:  Procedure Laterality Date   ANGIOPLASTY     2015   COLONOSCOPY     multiple   EYE SURGERY     PARS PLANA VITRECTOMY Left 03/15/2020   Procedure: PARS PLANA VITRECTOMY WITH 25 GAUGE;  Surgeon: Linard Reno, MD;  Location: [facility] OR;  Service: Ophthalmology;  Laterality: Left;   PHOTOCOAGULATION WITH LASER Left 03/15/2020   Procedure: PHOTOCOAGULATION WITH LASER; INTRAVITREAL INJECTION OF AVASTIN ;  Surgeon: Linard Reno, MD;  Location: [facility] OR;  Service: Ophthalmology;  Laterality: Left;   REFRACTIVE SURGERY     UPPER GI ENDOSCOPY     several   VITRECTOMY     Patient Active Problem List   Diagnosis Date Noted   Sleep-disordered breathing 12/31/2023    Actinic keratosis 11/30/2023   Vitamin D  deficiency 11/30/2023   Rhinitis 05/20/2023   Cervical spondylosis 08/26/2022   CKD (chronic kidney disease) stage 4, GFR 15-29 ml/min ([name]) 05/28/2021   Adjustment disorder 01/22/2021   Chronic back pain 10/22/2020   Peripheral vertigo 05/15/2020   Dermatitis 01/04/2020   Unintentional weight loss with loose stools 05/19/2019   Low vitamin B12 level 03/29/2019   Heme positive stool 03/14/2019   Normocytic anemia 03/14/2019   Chronic diastolic heart failure ([name]) 02/08/2019   Diabetic retinopathy ([name]) 09/08/2018   Physical debility 05/24/2018   Varicose veins of both lower extremities 03/01/2018   Fatigue due to exertion 10/14/2017   GERD (gastroesophageal reflux disease) 08/25/2017   CAD in native artery 04/14/2017   Hyperlipidemia associated with type 2 diabetes mellitus ([name]) 04/14/2017   Diabetic peripheral neuropathy associated with type 2 diabetes mellitus ([name]) 08/17/2016   [name] 05/02/2016   Hypertension associated with diabetes ([name]) 05/02/2016    PCP: Rodney Clamp, MD   REFERRING PROVIDER: Rodney Clamp, MD   REFERRING DIAG: R53.81 (ICD-10-CM) - Physical debility   THERAPY DIAG:  Muscle weakness (generalized)  Other abnormalities of gait and mobility  Difficulty in walking, not  elsewhere classified  Rationale for Evaluation and Treatment: Rehabilitation  ONSET DATE: 2022  SUBJECTIVE:   SUBJECTIVE STATEMENT: Not excessively tired after last visit.  Did fine. Later in session he reports he's been nauseated today prior to coming in.  Arrives via transport chair Brother: [name] identical twins  PERTINENT HISTORY: 3/11: evidence of lumbar compression deformity Compression fx L1 2022, CAD, CKD (stage 4), DM, cardiac arrest 2017, neuropathy, retinopathy Long history of gait abnormality beginning back in 2022. He has a history of CAD and believes that his debility began following a  covid infection. In December odf 2022 he fell and suffered a compression fracture of L1.  PAIN:  Are you having pain? no  PRECAUTIONS: Fall  RED FLAGS: None   WEIGHT BEARING RESTRICTIONS: No  FALLS:  Has patient fallen in last 6 months? No and with sit to stand falls back sometimes  LIVING ENVIRONMENT: Lives with: lives with their family Lives in: House/apartment Stairs: No Has following equipment at home: Single point cane, Environmental consultant - 2 wheeled, Environmental consultant - 4 wheeled, shower chair, Grab bars, and stand up walker also; also has shower chair at sink for shaving, brushing teeth etc, also has transporter.  OCCUPATION: retired  PLOF: Independent with household mobility with device  PATIENT GOALS: to walk without a walker  NEXT MD VISIT: 09/28/22  OBJECTIVE:  Note: Objective measures were completed at Evaluation unless otherwise noted.  DIAGNOSTIC FINDINGS: N/A  COGNITION: Overall cognitive status: Within functional limits for tasks assessed     SENSATION: Neuropathy in B feet   MUSCLE LENGTH: Marked HS R>L, hip flexor tightness   POSTURE: rounded shoulders and forward head  LOWER EXTREMITY ROM: WFL for tasks assessed  LOWER EXTREMITY MMT: *tested in hooklying  MMT Right eval Left eval 4/1  5/27  Hip flexion 4 4 4  Bil 4 Bil  Hip extension Able to bridge Able to bridge 4- Bil 4- Bil  Hip abduction 4-* 4-* 4- Bil 4- Bil  Hip adduction      Hip internal rotation      Hip external rotation      Knee flexion      Knee extension 4 4+ 4- Bil 4- Bil  Ankle dorsiflexion 4+ 4- 4- Bil 4- Bil  Ankle plantarflexion      Ankle inversion      Ankle eversion       (Blank rows = not tested)  3/20:  max UE assist to rise sit to stand; LE hip extension, knee extension grossly 3+ to 4-/5  FUNCTIONAL TESTS:  30 seconds chair stand test Timed up and go (TUG): 59.36 with 4WW Stance time: 45 sec patient reports unsteadiness and feels he goes backwards 07/27/2023 92 ft  decreased cadence; decreased step length; absent heel strike  12/26: 5x STS: unable to rise without heavy UE use; attempted to use armrests for test but does not come all the way up;  next trial with chair +cushion to touch at least one RW handle 48.2 sec:   TUG: cushion in seat +arm use and RW:  52.49 sec  08/13/23 attempted 5x STS but pt unable to complete: 1st rep does not come all the way up and then plops heavily into the transport wheelchair (he then recounts how this happened at home and how the chair tipped backwards) unable to complete reps Unable to complete TUG today, he does not have his 4 wheeled walker today and feels he is slower with the clinic RW  09/15/2023  TUG: 1:03 with rollator 5STS:48.55 sec heavy reliance on UE support; last repetition was a challenge  3/20: Heavy UE reliance with STS (unable to push up from [facility] armrests, pulls up from // bars) 4/1: max UE assist with sit to stand  5/27:  5x STS no cushion: able to complete 4 reps 47.19 sec            5x STS with cushion 22.5 seat height 5x 43.09 sec able to push up from [facility] armrests rather than // bars (much less reliance on Ues to rise)  GAIT: Distance walked: 25 Assistive device utilized: Walker - 4 wheeled Level of assistance: Modified independence Comments: slow speed, decreased step and stride, decreased heel strike, forward trunk lean.  TRANSFERS: Sit to stand: Uses walker to pull up; stand to sit: does not fully back up to chair and does not reach back; Supine to sit: needs min A for log roll  (has some back pain with sit <>supine due to sitting upward from supine) also fear of falling from bed, sit to supine needs min A to lift legs on to mat table The Patient-Specific Functional Scale  Initial:  I am going to ask you to identify up to 3 important activities that you are unable to do or are having difficulty with as a result of this problem.  Today are there any activities that you are unable to do or having  difficulty with because of this?  (Patient shown scale and patient rated each activity)  Follow up: When you first came in you had difficulty performing these activities.  Today do you still have difficulty?  Patient-Specific activity scoring scheme (Point to one number):  0 1 2 3 4 5 6 7 8 9  10 Unable                                                                                                          Able to perform To perform                                                                                                    activity at the same Activity         Level as before  Injury or problem  Activity            Getting out of the recliner                                                                     Initial:       3                5/27:  4 2.                   Walking with RW 40 feet                                                                Initial:           1              3.                          Standing to brush teeth                                                           Initial:       0 (has to do sitting on shower chair)     5/27: 3            4. Putting on socks and shoes  while sitting                                                      initial 2-3  TODAY'S TREATMENT:  01/05/24: Nu-Step L2 5 min Gait belt on for safety: 1st round 10 min 5x STS  with cushion pushing up from W/C armrests Foot prop on balance beam with UE Reaches and head turns inside // bars Balance beam toe taps to balance beam inside //bars Gait in // bars forward and backward: working on step length and armswing 2 rounds Seated dual cable rows  10# on each side 10x   12/24/23: Nu-step L4 5 min (SBA to transfer) 46 spm on average while discussing status Gait belt on for safety: 1st round 10 min, 2nd round 10 min Marching in place 8x Standing hip extension 5x  right/left  Bil UE forward reach and lateral reach 5x each right/left Sit to stand + foam cushion 2 sets of 5 with cues to push up from armrests on chair rather than pull from // bars and control descent Standing in // bars: UE reaches to circles on bars, stool multidirectional Gait in // bars forward and backward: working on step length and armswing Seated dual cable rows 7# on each side 20x;  increased to 10# on each side 10x   12/22/23: Nu-step L3 5 min (CGA to transfer) 50 spm on average Gait belt on for safety: 1st round 10 min, 2nd round 10 min Standing in // bars with trunk rotation to place cones on right/left  Standing in // bars with multi plane and weight shifting, head turns and UE reaching  Bil UE reaching overhead to challenge static balance 5x Sit to stand + foam cushion 2 sets of 5 with cues to push up from armrests on chair rather than pull from // bars and control descent Gait in // bars forward and backward: working on step length and armswing Gait with 5# KB carry 2 laps right/left (unable to turn with weight, needs 2 UE assist to turn) Seated dual cable rows 7# on each side 20x  12/17/23: Gait belt on for safety: 1st round 15 min, 2nd round 10 min Standing in // bars with high knees marching Standing in // bars with multi plane and weight shifting and UE reaching  Bil UE reaching overhead to challenge static balance 5x Pelvic press forward against heavy purple power cord 10 sec holds 8x Trunk extension against heavy purple power cord 10 sec holds 7x Sit to stand from transport chair with foam cushion with decreased UE support 2 sets of 5 (cues for push from [facility] rather than pull from // bars) Gait in // bars without holding on working on arm swing and longer step length, head up, erect posture    RPE 5/10           PATIENT EDUCATION:  Education details: PT eval findings, anticipated POC, initial HEP, postural awareness, and transfer safety  Person educated: Patient and  brother Education method: Explanation, Demonstration, and Handouts Education comprehension: verbalized understanding and returned demonstration  HOME EXERCISE PROGRAM: Access Code: [email or web address] URL: [email or web address] Date: 09/15/2023 Prepared by: Penelope Bowie  Exercises - Sit to Stand with Counter Support  - 3 x daily - 3-4 x weekly - 1 sets - 1-5 reps - Hooklying Clamshell with Resistance  - 2 x daily - 3-4 x weekly - 1 sets - 10 reps - Seated Long Arc Quad  - 1 x daily - 7 x weekly - 1-3 sets - 10 reps - 5 sec hold - Standing Hip Abduction with Counter Support  - 1 x daily - 7 x weekly - 1-2 sets - 10 reps - Standing March with Counter Support  - 1 x daily - 7 x weekly - 1-2 sets - 10 reps - Standing Heel Raise with Support  - 1 x daily - 7 x weekly - 1-2 sets - 10 reps - Shoulder External Rotation and Scapular Retraction with Resistance  - 1 x daily - 7 x weekly - 2 sets - 10 reps - Sit to Stand with Armchair  - 1 x daily - 7 x weekly - 1 sets - 5 reps  Patient Education - Tips to reduce freezing episodes with standing or walking   ASSESSMENT:  CLINICAL IMPRESSION: Treatment focus on LE strength needed for sit to stand transfers and improving gait pattern with cues for increasing step length and upright posture.  He is able to perform stand pivot transfers on/off the Nu-Step with supervision only now (previously required mod assist from therapist).  During the session he reports nausea and states he has had this prior to coming and while at the hospital last week.  He also expresses worry about the upcoming MRI.  Therapist  monitoring response to all interventions and modifying treatment accordingly.     OBJECTIVE IMPAIRMENTS: Abnormal gait, decreased balance, decreased ROM, decreased strength, decreased safety awareness, dizziness, impaired flexibility, impaired sensation, postural dysfunction, and pain.   ACTIVITY LIMITATIONS: carrying, lifting, bending, standing,  squatting, stairs, transfers, bed mobility, continence, bathing, and locomotion level  PARTICIPATION LIMITATIONS: shopping and community activity  PERSONAL FACTORS: Age, Fitness, Time since onset of injury/illness/exacerbation, and 3+ comorbidities: Compression fx L1 2022, CAD, CKD (stage 4), neuropathy, retinopathy are also affecting patient's functional outcome.   REHAB POTENTIAL: Good  CLINICAL DECISION MAKING: Unstable/unpredictable  EVALUATION COMPLEXITY: High   GOALS: Goals reviewed with patient? Yes  SHORT TERM GOALS: Target date: 07/30/23 Improved TUG to < 25 sec to decrease fall risk.  Baseline: Goal status: deferred  2.  Able to perform 5XSTS without UE assist and controlled descent showing improved LE strength.  Baseline:  Goal status: deferred 3.  Patient will be able to stand safely for 2 min without UE assist Baseline:  Goal status: met 5/27  LONG TERM GOALS: Target date: 03/01/2024   Ind with advanced HEP Baseline:  Goal status: IN PROGRESS   2.  Sit to stand from the recliner with PSFS score of 5 Baseline:  Goal status: partially met   3.  Gait with RW from the bedroom to the sunroom 40-50 feet with PSFS of 3 Baseline:  Goal status: ongoing  4. Improved LE mobility needed for greater ease with putting on socks and shoes in sitting with PSFS of 5 Baseline:  Goal status: ongoing  5.  Patient will be able to stand long enough to brush teeth PSFS of 2 Baseline:  Goal status: met 5/27  6. Able to rise from 22 inch seat height push from the armrests to use the new chairs in the living room (in July) New  7. Patient will be able to stand/walk for 13 minutes needed fro grooming and dressing tasks at home   PLAN:  PT FREQUENCY: 2x week  PT DURATION: 8 weeks  PLANNED INTERVENTIONS: 97164- PT Re-evaluation, 97110-Therapeutic exercises, 97530- Therapeutic activity, 97112- Neuromuscular re-education, 97535- Self Care, 16109- Manual therapy, [phone]- Gait  training, [phone]- Aquatic Therapy, 97014- Electrical stimulation (unattended), Patient/Family education, Balance training, Stair training, Taping, Joint mobilization, Spinal mobilization, DME instructions, Cryotherapy, and Moist heat  PLAN FOR NEXT SESSION: MRI on brain next Friday;  Nu-Step; dual cable rows;  sit to stand from foam pad; static standing with teal power cord isometric trunk extension;  UE movements while standing for ADLs; gait in // bars working on standing erect, longer step lengths;  assess energy levels and endurance ; functional strengthening; no leg press  Darien Eden, PT 01/07/24 6:10 PM Phone: [phone] Fax: [phone]

## 2024-01-08 ENCOUNTER — Encounter: Payer: Self-pay | Admitting: Family Medicine

## 2024-01-08 NOTE — Telephone Encounter (Signed)
 Information faxed

## 2024-01-11 ENCOUNTER — Encounter: Payer: Self-pay | Admitting: Family Medicine

## 2024-01-11 NOTE — Telephone Encounter (Signed)
 Information was send to wrong chart

## 2024-01-11 NOTE — Telephone Encounter (Signed)
 Can we get more info? Ok to schedule appointment if needed.  Jeffrey [name]. Daneil Dunker, MD 01/11/2024 12:18 PM

## 2024-01-11 NOTE — Telephone Encounter (Signed)
**Note De-identified  [place] Obfuscation** Please advise 

## 2024-01-12 ENCOUNTER — Ambulatory Visit: Attending: Family Medicine | Admitting: Physical Therapy

## 2024-01-12 DIAGNOSIS — M6281 Muscle weakness (generalized): Secondary | ICD-10-CM | POA: Diagnosis not present

## 2024-01-12 DIAGNOSIS — R2689 Other abnormalities of gait and mobility: Secondary | ICD-10-CM | POA: Diagnosis not present

## 2024-01-12 DIAGNOSIS — R262 Difficulty in walking, not elsewhere classified: Secondary | ICD-10-CM | POA: Insufficient documentation

## 2024-01-12 NOTE — Therapy (Signed)
 OUTPATIENT PHYSICAL THERAPY LOWER EXTREMITY TREATMENT  Patient Name: Jeffrey [name] MRN: [id number] DOB:April 03, 1956, 68 y.o., male Today's Date: 01/12/2024    END OF SESSION:  PT End of Session - 01/12/24 1601     Visit Number 33    Date for PT Re-Evaluation 03/01/24    Authorization Type HTA    Progress Note Due on Visit 40    PT Start Time 1325    PT Stop Time 1408    PT Time Calculation (min) 43 min    Equipment Utilized During Treatment Gait belt    Activity Tolerance Patient tolerated treatment well                 Past Medical History:  Diagnosis Date   Cardiac arrest ([name]) 05/22/2016   Cataract    CHF (congestive heart failure) ([name])    CKD (chronic kidney disease) stage 3, GFR 30-59 ml/min ([name]) 05/28/2021   CKD (chronic kidney disease) stage 4, GFR 15-29 ml/min ([name]) 05/28/2021   Diabetes mellitus without complication ([name])    type 2   Diabetic retinopathy ([name])    Hyperlipidemia    Hypertension    Memory loss    mild   Myocardial infarct ([name]) 2105   Retinopathy    Both eyes   S/P primary angioplasty with coronary stent 05/22/2016   Sleep-disordered breathing 12/31/2023   Vitamin B12 deficiency    Vitreous hemorrhage of left eye ([name])    proliferative diabetic retinopathy   Past Surgical History:  Procedure Laterality Date   ANGIOPLASTY     2015   COLONOSCOPY     multiple   EYE SURGERY     PARS PLANA VITRECTOMY Left 03/15/2020   Procedure: PARS PLANA VITRECTOMY WITH 25 GAUGE;  Surgeon: Linard Reno, MD;  Location: [facility] OR;  Service: Ophthalmology;  Laterality: Left;   PHOTOCOAGULATION WITH LASER Left 03/15/2020   Procedure: PHOTOCOAGULATION WITH LASER; INTRAVITREAL INJECTION OF AVASTIN ;  Surgeon: Linard Reno, MD;  Location: [facility] OR;  Service: Ophthalmology;  Laterality: Left;   REFRACTIVE SURGERY     UPPER GI ENDOSCOPY     several   VITRECTOMY     Patient Active Problem List   Diagnosis Date Noted   Sleep-disordered breathing 12/31/2023    Actinic keratosis 11/30/2023   Vitamin D  deficiency 11/30/2023   Rhinitis 05/20/2023   Cervical spondylosis 08/26/2022   CKD (chronic kidney disease) stage 4, GFR 15-29 ml/min ([name]) 05/28/2021   Adjustment disorder 01/22/2021   Chronic back pain 10/22/2020   Peripheral vertigo 05/15/2020   Dermatitis 01/04/2020   Unintentional weight loss with loose stools 05/19/2019   Low vitamin B12 level 03/29/2019   Heme positive stool 03/14/2019   Normocytic anemia 03/14/2019   Chronic diastolic heart failure ([name]) 02/08/2019   Diabetic retinopathy ([name]) 09/08/2018   Physical debility 05/24/2018   Varicose veins of both lower extremities 03/01/2018   Fatigue due to exertion 10/14/2017   GERD (gastroesophageal reflux disease) 08/25/2017   CAD in native artery 04/14/2017   Hyperlipidemia associated with type 2 diabetes mellitus ([name]) 04/14/2017   Diabetic peripheral neuropathy associated with type 2 diabetes mellitus ([name]) 08/17/2016   [name] 05/02/2016   Hypertension associated with diabetes ([name]) 05/02/2016    PCP: Rodney Clamp, MD   REFERRING PROVIDER: Rodney Clamp, MD   REFERRING DIAG: R53.81 (ICD-10-CM) - Physical debility   THERAPY DIAG:  Muscle weakness (generalized)  Other abnormalities of gait and mobility  Difficulty in walking, not  elsewhere classified  Rationale for Evaluation and Treatment: Rehabilitation  ONSET DATE: 2022  SUBJECTIVE:   SUBJECTIVE STATEMENT: Not excessively tired after last visit.  Did fine. Later in session he reports he's been nauseated today prior to coming in.  Arrives via transport chair Brother: [name] identical twins  PERTINENT HISTORY: 3/11: evidence of lumbar compression deformity Compression fx L1 2022, CAD, CKD (stage 4), DM, cardiac arrest 2017, neuropathy, retinopathy Long history of gait abnormality beginning back in 2022. He has a history of CAD and believes that his debility began following a  covid infection. In December odf 2022 he fell and suffered a compression fracture of L1.  PAIN:  Are you having pain? no  PRECAUTIONS: Fall  RED FLAGS: None   WEIGHT BEARING RESTRICTIONS: No  FALLS:  Has patient fallen in last 6 months? No and with sit to stand falls back sometimes  LIVING ENVIRONMENT: Lives with: lives with their family Lives in: House/apartment Stairs: No Has following equipment at home: Single point cane, Environmental consultant - 2 wheeled, Environmental consultant - 4 wheeled, shower chair, Grab bars, and stand up walker also; also has shower chair at sink for shaving, brushing teeth etc, also has transporter.  OCCUPATION: retired  PLOF: Independent with household mobility with device  PATIENT GOALS: to walk without a walker  NEXT MD VISIT: 09/28/22  OBJECTIVE:  Note: Objective measures were completed at Evaluation unless otherwise noted.  DIAGNOSTIC FINDINGS: N/A  COGNITION: Overall cognitive status: Within functional limits for tasks assessed     SENSATION: Neuropathy in B feet   MUSCLE LENGTH: Marked HS R>L, hip flexor tightness   POSTURE: rounded shoulders and forward head  LOWER EXTREMITY ROM: WFL for tasks assessed  LOWER EXTREMITY MMT: *tested in hooklying  MMT Right eval Left eval 4/1  5/27  Hip flexion 4 4 4  Bil 4 Bil  Hip extension Able to bridge Able to bridge 4- Bil 4- Bil  Hip abduction 4-* 4-* 4- Bil 4- Bil  Hip adduction      Hip internal rotation      Hip external rotation      Knee flexion      Knee extension 4 4+ 4- Bil 4- Bil  Ankle dorsiflexion 4+ 4- 4- Bil 4- Bil  Ankle plantarflexion      Ankle inversion      Ankle eversion       (Blank rows = not tested)  3/20:  max UE assist to rise sit to stand; LE hip extension, knee extension grossly 3+ to 4-/5  FUNCTIONAL TESTS:  30 seconds chair stand test Timed up and go (TUG): 59.36 with 4WW Stance time: 45 sec patient reports unsteadiness and feels he goes backwards 07/27/2023 92 ft  decreased cadence; decreased step length; absent heel strike  12/26: 5x STS: unable to rise without heavy UE use; attempted to use armrests for test but does not come all the way up;  next trial with chair +cushion to touch at least one RW handle 48.2 sec:   TUG: cushion in seat +arm use and RW:  52.49 sec  08/13/23 attempted 5x STS but pt unable to complete: 1st rep does not come all the way up and then plops heavily into the transport wheelchair (he then recounts how this happened at home and how the chair tipped backwards) unable to complete reps Unable to complete TUG today, he does not have his 4 wheeled walker today and feels he is slower with the clinic RW  09/15/2023  TUG: 1:03 with rollator 5STS:48.55 sec heavy reliance on UE support; last repetition was a challenge  3/20: Heavy UE reliance with STS (unable to push up from [facility] armrests, pulls up from // bars) 4/1: max UE assist with sit to stand  5/27:  5x STS no cushion: able to complete 4 reps 47.19 sec            5x STS with cushion 22.5 seat height 5x 43.09 sec able to push up from [facility] armrests rather than // bars (much less reliance on Ues to rise)  GAIT: Distance walked: 25 Assistive device utilized: Walker - 4 wheeled Level of assistance: Modified independence Comments: slow speed, decreased step and stride, decreased heel strike, forward trunk lean.  TRANSFERS: Sit to stand: Uses walker to pull up; stand to sit: does not fully back up to chair and does not reach back; Supine to sit: needs min A for log roll  (has some back pain with sit <>supine due to sitting upward from supine) also fear of falling from bed, sit to supine needs min A to lift legs on to mat table The Patient-Specific Functional Scale  Initial:  I am going to ask you to identify up to 3 important activities that you are unable to do or are having difficulty with as a result of this problem.  Today are there any activities that you are unable to do or having  difficulty with because of this?  (Patient shown scale and patient rated each activity)  Follow up: When you first came in you had difficulty performing these activities.  Today do you still have difficulty?  Patient-Specific activity scoring scheme (Point to one number):  0 1 2 3 4 5 6 7 8 9  10 Unable                                                                                                          Able to perform To perform                                                                                                    activity at the same Activity         Level as before  Injury or problem  Activity            Getting out of the recliner                                                                     Initial:       3                5/27:  4 2.                   Walking with RW 40 feet                                                                Initial:           1              3.                          Standing to brush teeth                                                           Initial:       0 (has to do sitting on shower chair)     5/27: 3            4. Putting on socks and shoes  while sitting                                                      initial 2-3  TODAY'S TREATMENT:  01/12/24: Gait belt on for safety: 1st round 10 min, 2nd round 10 min 5x STS 3 rounds with cushion pushing up from W/C armrests and transfer hands to // bars Inside // bars: UE reaches single and bil reaches 5x each Inside // bars: reach down to touch green physioball with 2 hands Circle toe taps in rainbow formation inside //bars 3 rounds of 6 Gait in // bars forward and backward: working on step length and armswing 2 rounds Seated dual cable rows  10# on each side 10x  Nu-Step L3 Les only  5 min   01/05/24: Nu-Step L2 5 min Gait belt on for safety: 1st round 10 min 5x  STS  with cushion pushing up from W/C armrests Foot prop on balance beam with UE Reaches and head turns inside // bars Balance beam toe taps to balance beam inside //bars Gait in // bars forward and backward: working on step length and armswing 2 rounds Seated dual cable rows  10# on each side 10x   12/24/23: Nu-step L4 5 min (SBA to transfer) 46  spm on average while discussing status Gait belt on for safety: 1st round 10 min, 2nd round 10 min Marching in place 8x Standing hip extension 5x right/left  Bil UE forward reach and lateral reach 5x each right/left Sit to stand + foam cushion 2 sets of 5 with cues to push up from armrests on chair rather than pull from // bars and control descent Standing in // bars: UE reaches to circles on bars, stool multidirectional Gait in // bars forward and backward: working on step length and armswing Seated dual cable rows 7# on each side 20x; increased to 10# on each side 10x   12/22/23: Nu-step L3 5 min (CGA to transfer) 50 spm on average Gait belt on for safety: 1st round 10 min, 2nd round 10 min Standing in // bars with trunk rotation to place cones on right/left  Standing in // bars with multi plane and weight shifting, head turns and UE reaching  Bil UE reaching overhead to challenge static balance 5x Sit to stand + foam cushion 2 sets of 5 with cues to push up from armrests on chair rather than pull from // bars and control descent Gait in // bars forward and backward: working on step length and armswing Gait with 5# KB carry 2 laps right/left (unable to turn with weight, needs 2 UE assist to turn) Seated dual cable rows 7# on each side 20x PATIENT EDUCATION:  Education details: PT eval findings, anticipated POC, initial HEP, postural awareness, and transfer safety  Person educated: Patient and brother Education method: Explanation, Demonstration, and Handouts Education comprehension: verbalized understanding and returned demonstration  HOME  EXERCISE PROGRAM: Access Code: [email or web address] URL: [email or web address] Date: 09/15/2023 Prepared by: Penelope Bowie  Exercises - Sit to Stand with Counter Support  - 3 x daily - 3-4 x weekly - 1 sets - 1-5 reps - Hooklying Clamshell with Resistance  - 2 x daily - 3-4 x weekly - 1 sets - 10 reps - Seated Long Arc Quad  - 1 x daily - 7 x weekly - 1-3 sets - 10 reps - 5 sec hold - Standing Hip Abduction with Counter Support  - 1 x daily - 7 x weekly - 1-2 sets - 10 reps - Standing March with Counter Support  - 1 x daily - 7 x weekly - 1-2 sets - 10 reps - Standing Heel Raise with Support  - 1 x daily - 7 x weekly - 1-2 sets - 10 reps - Shoulder External Rotation and Scapular Retraction with Resistance  - 1 x daily - 7 x weekly - 2 sets - 10 reps - Sit to Stand with Armchair  - 1 x daily - 7 x weekly - 1 sets - 5 reps  Patient Education - Tips to reduce freezing episodes with standing or walking   ASSESSMENT:  CLINICAL IMPRESSION: Able to increase intensity for LE strength with legs -only on the Nu-Step.  In standing/walking exercises within the parallel bars, he continues to lean heavily on Ues.  Verbal cues to decrease forward lean on hands and use the inclusion of UE reaching to promote more weight bearing through his legs. Much improved sit to stand with less UE use using a 2 1/2 inch seat cushion (now has one to use in his transport chair).  CGA with gait belt for all standing exercises and transfers for safety.     OBJECTIVE IMPAIRMENTS: Abnormal gait, decreased balance, decreased ROM, decreased strength, decreased safety awareness, dizziness, impaired flexibility,  impaired sensation, postural dysfunction, and pain.   ACTIVITY LIMITATIONS: carrying, lifting, bending, standing, squatting, stairs, transfers, bed mobility, continence, bathing, and locomotion level  PARTICIPATION LIMITATIONS: shopping and community activity  PERSONAL FACTORS: Age, Fitness, Time since onset of  injury/illness/exacerbation, and 3+ comorbidities: Compression fx L1 2022, CAD, CKD (stage 4), neuropathy, retinopathy are also affecting patient's functional outcome.   REHAB POTENTIAL: Good  CLINICAL DECISION MAKING: Unstable/unpredictable  EVALUATION COMPLEXITY: High   GOALS: Goals reviewed with patient? Yes  SHORT TERM GOALS: Target date: 07/30/23 Improved TUG to < 25 sec to decrease fall risk.  Baseline: Goal status: deferred  2.  Able to perform 5XSTS without UE assist and controlled descent showing improved LE strength.  Baseline:  Goal status: deferred 3.  Patient will be able to stand safely for 2 min without UE assist Baseline:  Goal status: met 5/27  LONG TERM GOALS: Target date: 03/01/2024   Ind with advanced HEP Baseline:  Goal status: IN PROGRESS   2.  Sit to stand from the recliner with PSFS score of 5 Baseline:  Goal status: partially met   3.  Gait with RW from the bedroom to the sunroom 40-50 feet with PSFS of 3 Baseline:  Goal status: ongoing  4. Improved LE mobility needed for greater ease with putting on socks and shoes in sitting with PSFS of 5 Baseline:  Goal status: ongoing  5.  Patient will be able to stand long enough to brush teeth PSFS of 2 Baseline:  Goal status: met 5/27  6. Able to rise from 22 inch seat height push from the armrests to use the new chairs in the living room (in July) New  7. Patient will be able to stand/walk for 13 minutes needed fro grooming and dressing tasks at home   PLAN:  PT FREQUENCY: 2x week  PT DURATION: 8 weeks  PLANNED INTERVENTIONS: 97164- PT Re-evaluation, 97110-Therapeutic exercises, 97530- Therapeutic activity, 97112- Neuromuscular re-education, 97535- Self Care, 65784- Manual therapy, [phone]- Gait training, [phone]- Aquatic Therapy, 97014- Electrical stimulation (unattended), Patient/Family education, Balance training, Stair training, Taping, Joint mobilization, Spinal mobilization, DME  instructions, Cryotherapy, and Moist heat  PLAN FOR NEXT SESSION: MRI on brain on Friday to check for NPH;  Nu-Step legs only; dual cable rows;  sit to stand from foam pad; static standing with teal power cord isometric trunk extension;  UE movements while standing for ADLs; gait in // bars working on standing erect, longer step lengths;  assess energy levels and endurance ; functional strengthening; no leg press  Darien Eden, PT 01/12/24 4:25 PM Phone: [phone] Fax: [phone]

## 2024-01-14 ENCOUNTER — Ambulatory Visit: Admitting: Physical Therapy

## 2024-01-15 ENCOUNTER — Ambulatory Visit
Admission: RE | Admit: 2024-01-15 | Discharge: 2024-01-15 | Disposition: A | Discharge status: Home / Self Care | Source: Ambulatory Visit | Attending: Family Medicine | Admitting: Family Medicine

## 2024-01-15 DIAGNOSIS — R531 Weakness: Secondary | ICD-10-CM | POA: Diagnosis not present

## 2024-01-15 DIAGNOSIS — G919 Hydrocephalus, unspecified: Secondary | ICD-10-CM | POA: Diagnosis not present

## 2024-01-15 DIAGNOSIS — R262 Difficulty in walking, not elsewhere classified: Secondary | ICD-10-CM | POA: Diagnosis not present

## 2024-01-19 ENCOUNTER — Ambulatory Visit: Admitting: Physical Therapy

## 2024-01-21 ENCOUNTER — Ambulatory Visit: Admitting: Physical Therapy

## 2024-01-21 DIAGNOSIS — M6281 Muscle weakness (generalized): Secondary | ICD-10-CM | POA: Diagnosis not present

## 2024-01-21 DIAGNOSIS — R2689 Other abnormalities of gait and mobility: Secondary | ICD-10-CM

## 2024-01-21 DIAGNOSIS — R262 Difficulty in walking, not elsewhere classified: Secondary | ICD-10-CM

## 2024-01-21 NOTE — Therapy (Signed)
 OUTPATIENT PHYSICAL THERAPY LOWER EXTREMITY TREATMENT  Patient Name: Jeffrey [name] MRN: [id number] DOB:Jan 27, 1956, 68 y.o., male Today's Date: 01/21/2024    END OF SESSION:  PT End of Session - 01/21/24 1552     Visit Number 34    Date for PT Re-Evaluation 03/01/24    Authorization Type HTA    Progress Note Due on Visit 40    PT Start Time 1531    PT Stop Time 1614    PT Time Calculation (min) 43 min    Equipment Utilized During Treatment Gait belt    Activity Tolerance Patient tolerated treatment well              Past Medical History:  Diagnosis Date   Cardiac arrest ([name]) 05/22/2016   Cataract    CHF (congestive heart failure) ([name])    CKD (chronic kidney disease) stage 3, GFR 30-59 ml/min ([name]) 05/28/2021   CKD (chronic kidney disease) stage 4, GFR 15-29 ml/min ([name]) 05/28/2021   Diabetes mellitus without complication ([name])    type 2   Diabetic retinopathy ([name])    Hyperlipidemia    Hypertension    Memory loss    mild   Myocardial infarct ([name]) 2105   Retinopathy    Both eyes   S/P primary angioplasty with coronary stent 05/22/2016   Sleep-disordered breathing 12/31/2023   Vitamin B12 deficiency    Vitreous hemorrhage of left eye ([name])    proliferative diabetic retinopathy   Past Surgical History:  Procedure Laterality Date   ANGIOPLASTY     2015   COLONOSCOPY     multiple   EYE SURGERY     PARS PLANA VITRECTOMY Left 03/15/2020   Procedure: PARS PLANA VITRECTOMY WITH 25 GAUGE;  Surgeon: Linard Reno, MD;  Location: [facility] OR;  Service: Ophthalmology;  Laterality: Left;   PHOTOCOAGULATION WITH LASER Left 03/15/2020   Procedure: PHOTOCOAGULATION WITH LASER; INTRAVITREAL INJECTION OF AVASTIN ;  Surgeon: Linard Reno, MD;  Location: [facility] OR;  Service: Ophthalmology;  Laterality: Left;   REFRACTIVE SURGERY     UPPER GI ENDOSCOPY     several   VITRECTOMY     Patient Active Problem List   Diagnosis Date Noted   Sleep-disordered breathing 12/31/2023   Actinic  keratosis 11/30/2023   Vitamin D  deficiency 11/30/2023   Rhinitis 05/20/2023   Cervical spondylosis 08/26/2022   CKD (chronic kidney disease) stage 4, GFR 15-29 ml/min ([name]) 05/28/2021   Adjustment disorder 01/22/2021   Chronic back pain 10/22/2020   Peripheral vertigo 05/15/2020   Dermatitis 01/04/2020   Unintentional weight loss with loose stools 05/19/2019   Low vitamin B12 level 03/29/2019   Heme positive stool 03/14/2019   Normocytic anemia 03/14/2019   Chronic diastolic heart failure ([name]) 02/08/2019   Diabetic retinopathy ([name]) 09/08/2018   Physical debility 05/24/2018   Varicose veins of both lower extremities 03/01/2018   Fatigue due to exertion 10/14/2017   GERD (gastroesophageal reflux disease) 08/25/2017   CAD in native artery 04/14/2017   Hyperlipidemia associated with type 2 diabetes mellitus ([name]) 04/14/2017   Diabetic peripheral neuropathy associated with type 2 diabetes mellitus ([name]) 08/17/2016   [name] 05/02/2016   Hypertension associated with diabetes ([name]) 05/02/2016    PCP: Rodney Clamp, MD   REFERRING PROVIDER: Rodney Clamp, MD   REFERRING DIAG: R53.81 (ICD-10-CM) - Physical debility   THERAPY DIAG:  Muscle weakness (generalized)  Other abnormalities of gait and mobility  Difficulty in walking, not elsewhere classified  Rationale for Evaluation and Treatment: Rehabilitation  ONSET DATE: 2022  SUBJECTIVE:   SUBJECTIVE STATEMENT: Still recovering from shingles shot.  Awaiting results of MRI regarding NPH.  States he's not sleeping b/c he's worried about it.   States he has to wear 2 Depends undergarments    Arrives via transport chair Brother: [name] identical twins  PERTINENT HISTORY: 3/11: evidence of lumbar compression deformity Compression fx L1 2022, CAD, CKD (stage 4), DM, cardiac arrest 2017, neuropathy, retinopathy Long history of gait abnormality beginning back in 2022. He has a history of CAD  and believes that his debility began following a covid infection. In December odf 2022 he fell and suffered a compression fracture of L1.  PAIN:  Are you having pain? no  PRECAUTIONS: Fall  RED FLAGS: None   WEIGHT BEARING RESTRICTIONS: No  FALLS:  Has patient fallen in last 6 months? No and with sit to stand falls back sometimes  LIVING ENVIRONMENT: Lives with: lives with their family Lives in: House/apartment Stairs: No Has following equipment at home: Single point cane, Environmental consultant - 2 wheeled, Environmental consultant - 4 wheeled, shower chair, Grab bars, and stand up walker also; also has shower chair at sink for shaving, brushing teeth etc, also has transporter.  OCCUPATION: retired  PLOF: Independent with household mobility with device  PATIENT GOALS: to walk without a walker  NEXT MD VISIT: 09/28/22  OBJECTIVE:  Note: Objective measures were completed at Evaluation unless otherwise noted.  DIAGNOSTIC FINDINGS: N/A  COGNITION: Overall cognitive status: Within functional limits for tasks assessed     SENSATION: Neuropathy in B feet   MUSCLE LENGTH: Marked HS R>L, hip flexor tightness   POSTURE: rounded shoulders and forward head  LOWER EXTREMITY ROM: WFL for tasks assessed  LOWER EXTREMITY MMT: *tested in hooklying  MMT Right eval Left eval 4/1  5/27  Hip flexion 4 4 4  Bil 4 Bil  Hip extension Able to bridge Able to bridge 4- Bil 4- Bil  Hip abduction 4-* 4-* 4- Bil 4- Bil  Hip adduction      Hip internal rotation      Hip external rotation      Knee flexion      Knee extension 4 4+ 4- Bil 4- Bil  Ankle dorsiflexion 4+ 4- 4- Bil 4- Bil  Ankle plantarflexion      Ankle inversion      Ankle eversion       (Blank rows = not tested)  3/20:  max UE assist to rise sit to stand; LE hip extension, knee extension grossly 3+ to 4-/5  FUNCTIONAL TESTS:  30 seconds chair stand test Timed up and go (TUG): 59.36 with 4WW Stance time: 45 sec patient reports unsteadiness and  feels he goes backwards 07/27/2023 92 ft decreased cadence; decreased step length; absent heel strike  12/26: 5x STS: unable to rise without heavy UE use; attempted to use armrests for test but does not come all the way up;  next trial with chair +cushion to touch at least one RW handle 48.2 sec:   TUG: cushion in seat +arm use and RW:  52.49 sec  08/13/23 attempted 5x STS but pt unable to complete: 1st rep does not come all the way up and then plops heavily into the transport wheelchair (he then recounts how this happened at home and how the chair tipped backwards) unable to complete reps Unable to complete TUG today, he does not have his 4 wheeled walker today and feels  he is slower with the clinic RW  09/15/2023 TUG: 1:03 with rollator 5STS:48.55 sec heavy reliance on UE support; last repetition was a challenge  3/20: Heavy UE reliance with STS (unable to push up from [facility] armrests, pulls up from // bars) 4/1: max UE assist with sit to stand  5/27:  5x STS no cushion: able to complete 4 reps 47.19 sec            5x STS with cushion 22.5 seat height 5x 43.09 sec able to push up from [facility] armrests rather than // bars (much less reliance on Ues to rise)  GAIT: Distance walked: 25 Assistive device utilized: Walker - 4 wheeled Level of assistance: Modified independence Comments: slow speed, decreased step and stride, decreased heel strike, forward trunk lean.  TRANSFERS: Sit to stand: Uses walker to pull up; stand to sit: does not fully back up to chair and does not reach back; Supine to sit: needs min A for log roll  (has some back pain with sit <>supine due to sitting upward from supine) also fear of falling from bed, sit to supine needs min A to lift legs on to mat table The Patient-Specific Functional Scale  Initial:  I am going to ask you to identify up to 3 important activities that you are unable to do or are having difficulty with as a result of this problem.  Today are there any  activities that you are unable to do or having difficulty with because of this?  (Patient shown scale and patient rated each activity)  Follow up: When you first came in you had difficulty performing these activities.  Today do you still have difficulty?  Patient-Specific activity scoring scheme (Point to one number):  0 1 2 3 4 5 6 7 8 9  10 Unable                                                                                                          Able to perform To perform                                                                                                    activity at the same Activity         Level as before  Injury or problem  Activity            Getting out of the recliner                                                                     Initial:       3                5/27:  4 2.                   Walking with RW 40 feet                                                                Initial:           1              3.                          Standing to brush teeth                                                           Initial:       0 (has to do sitting on shower chair)     5/27: 3            4. Putting on socks and shoes  while sitting                                                      initial 2-3  TODAY'S TREATMENT:  01/21/24: Gait belt on for safety: 1st round 13 min, 2nd round 10 min 3x STS  with cushion pushing up from W/C armrests and transfer hands to // bars Inside // bars: UE reaches single side reaches 5x each Inside // bars: marching Inside // bars: forward and lateral step overs theraband line on the floor Circle toe taps in rainbow formation inside //bars 3 rounds of 6 Gait in // bars forward and backward: working on step length and armswing 2 rounds Seated dual cable  bil rows  10# on each side 10x  Seated LAQ 2# 10x2  right/left; bil lift 30 sec hold Sated march 2# 10x right/left  Seated 2# weights cross legs 3x each side  01/12/24: Gait belt on for safety: 1st round 10 min, 2nd round 10 min 5x STS 3 rounds with cushion pushing up from W/C armrests and transfer hands to // bars Inside // bars: UE reaches single and bil reaches 5x each Inside // bars: reach down to touch green physioball with 2 hands Circle toe taps in rainbow formation inside //bars  3 rounds of 6 Gait in // bars forward and backward: working on step length and armswing 2 rounds Seated dual cable rows  10# on each side 10x  Nu-Step L3 Les only  5 min   01/05/24: Nu-Step L2 5 min Gait belt on for safety: 1st round 10 min 5x STS  with cushion pushing up from W/C armrests Foot prop on balance beam with UE Reaches and head turns inside // bars Balance beam toe taps to balance beam inside //bars Gait in // bars forward and backward: working on step length and armswing 2 rounds Seated dual cable rows  10# on each side 10x   12/24/23: Nu-step L4 5 min (SBA to transfer) 46 spm on average while discussing status Gait belt on for safety: 1st round 10 min, 2nd round 10 min Marching in place 8x Standing hip extension 5x right/left  Bil UE forward reach and lateral reach 5x each right/left Sit to stand + foam cushion 2 sets of 5 with cues to push up from armrests on chair rather than pull from // bars and control descent Standing in // bars: UE reaches to circles on bars, stool multidirectional Gait in // bars forward and backward: working on step length and armswing Seated dual cable rows 7# on each side 20x; increased to 10# on each side 10x   12/22/23: Nu-step L3 5 min (CGA to transfer) 50 spm on average Gait belt on for safety: 1st round 10 min, 2nd round 10 min Standing in // bars with trunk rotation to place cones on right/left  Standing in // bars with multi plane and weight shifting, head turns and UE reaching  Bil UE reaching  overhead to challenge static balance 5x Sit to stand + foam cushion 2 sets of 5 with cues to push up from armrests on chair rather than pull from // bars and control descent Gait in // bars forward and backward: working on step length and armswing Gait with 5# KB carry 2 laps right/left (unable to turn with weight, needs 2 UE assist to turn) Seated dual cable rows 7# on each side 20x PATIENT EDUCATION:  Education details: PT eval findings, anticipated POC, initial HEP, postural awareness, and transfer safety  Person educated: Patient and brother Education method: Explanation, Demonstration, and Handouts Education comprehension: verbalized understanding and returned demonstration  HOME EXERCISE PROGRAM: Access Code: [email or web address] URL: [email or web address] Date: 09/15/2023 Prepared by: Penelope Bowie  Exercises - Sit to Stand with Counter Support  - 3 x daily - 3-4 x weekly - 1 sets - 1-5 reps - Hooklying Clamshell with Resistance  - 2 x daily - 3-4 x weekly - 1 sets - 10 reps - Seated Long Arc Quad  - 1 x daily - 7 x weekly - 1-3 sets - 10 reps - 5 sec hold - Standing Hip Abduction with Counter Support  - 1 x daily - 7 x weekly - 1-2 sets - 10 reps - Standing March with Counter Support  - 1 x daily - 7 x weekly - 1-2 sets - 10 reps - Standing Heel Raise with Support  - 1 x daily - 7 x weekly - 1-2 sets - 10 reps - Shoulder External Rotation and Scapular Retraction with Resistance  - 1 x daily - 7 x weekly - 2 sets - 10 reps - Sit to Stand with Armchair  - 1 x daily - 7 x weekly - 1 sets - 5 reps  Patient Education - Tips to reduce  freezing episodes with standing or walking   ASSESSMENT:  CLINICAL IMPRESSION: Caleel cancelled the last 2 sessions secondary to a bad reaction to the shingles shot.  He reports he is not sleeping well as he is worried about the results of his brain scan for possible NPH.  He reports general fatigue today but he is able to remain standing/walking  inside the parallel bars for 2 extended periods of time.  Verbal cues for upright posture, longer step lengths, arm swing and step height.  Turning for change of walking direction and with pivot transfers is slow and difficult for him to pick up his feet.  Moderate fatigue level with being on his feet but able to participate in seated UE and LE strengthening towards the end of session.    OBJECTIVE IMPAIRMENTS: Abnormal gait, decreased balance, decreased ROM, decreased strength, decreased safety awareness, dizziness, impaired flexibility, impaired sensation, postural dysfunction, and pain.   ACTIVITY LIMITATIONS: carrying, lifting, bending, standing, squatting, stairs, transfers, bed mobility, continence, bathing, and locomotion level  PARTICIPATION LIMITATIONS: shopping and community activity  PERSONAL FACTORS: Age, Fitness, Time since onset of injury/illness/exacerbation, and 3+ comorbidities: Compression fx L1 2022, CAD, CKD (stage 4), neuropathy, retinopathy are also affecting patient's functional outcome.   REHAB POTENTIAL: Good  CLINICAL DECISION MAKING: Unstable/unpredictable  EVALUATION COMPLEXITY: High   GOALS: Goals reviewed with patient? Yes  SHORT TERM GOALS: Target date: 07/30/23 Improved TUG to < 25 sec to decrease fall risk.  Baseline: Goal status: deferred  2.  Able to perform 5XSTS without UE assist and controlled descent showing improved LE strength.  Baseline:  Goal status: deferred 3.  Patient will be able to stand safely for 2 min without UE assist Baseline:  Goal status: met 5/27  LONG TERM GOALS: Target date: 03/01/2024   Ind with advanced HEP Baseline:  Goal status: IN PROGRESS   2.  Sit to stand from the recliner with PSFS score of 5 Baseline:  Goal status: partially met   3.  Gait with RW from the bedroom to the sunroom 40-50 feet with PSFS of 3 Baseline:  Goal status: ongoing  4. Improved LE mobility needed for greater ease with putting on socks  and shoes in sitting with PSFS of 5 Baseline:  Goal status: ongoing  5.  Patient will be able to stand long enough to brush teeth PSFS of 2 Baseline:  Goal status: met 5/27  6. Able to rise from 22 inch seat height push from the armrests to use the new chairs in the living room (in July) New  7. Patient will be able to stand/walk for 13 minutes needed fro grooming and dressing tasks at home   PLAN:  PT FREQUENCY: 2x week  PT DURATION: 8 weeks  PLANNED INTERVENTIONS: 97164- PT Re-evaluation, 97110-Therapeutic exercises, 97530- Therapeutic activity, 97112- Neuromuscular re-education, 97535- Self Care, 16109- Manual therapy, [phone]- Gait training, [phone]- Aquatic Therapy, 97014- Electrical stimulation (unattended), Patient/Family education, Balance training, Stair training, Taping, Joint mobilization, Spinal mobilization, DME instructions, Cryotherapy, and Moist heat  PLAN FOR NEXT SESSION: awaiting MRI results to check for NPH;  Nu-Step legs only; dual cable rows;  sit to stand from foam pad; static standing with teal power cord isometric trunk extension;  UE movements while standing for ADLs; gait in // bars working on standing erect, longer step lengths;  assess energy levels and endurance ; functional strengthening; no leg press  Darien Eden, PT 01/21/24 5:17 PM Phone: [phone] Fax: [phone]

## 2024-01-24 ENCOUNTER — Other Ambulatory Visit: Payer: Self-pay | Admitting: Family Medicine

## 2024-01-25 ENCOUNTER — Encounter: Payer: Self-pay | Admitting: Family Medicine

## 2024-01-26 ENCOUNTER — Ambulatory Visit: Admitting: Physical Therapy

## 2024-01-26 ENCOUNTER — Other Ambulatory Visit: Payer: Self-pay | Admitting: *Deleted

## 2024-01-26 ENCOUNTER — Encounter: Payer: Self-pay | Admitting: Physical Therapy

## 2024-01-26 DIAGNOSIS — M6281 Muscle weakness (generalized): Secondary | ICD-10-CM

## 2024-01-26 DIAGNOSIS — R262 Difficulty in walking, not elsewhere classified: Secondary | ICD-10-CM

## 2024-01-26 DIAGNOSIS — R2689 Other abnormalities of gait and mobility: Secondary | ICD-10-CM

## 2024-01-26 DIAGNOSIS — G8929 Other chronic pain: Secondary | ICD-10-CM

## 2024-01-26 DIAGNOSIS — H81399 Other peripheral vertigo, unspecified ear: Secondary | ICD-10-CM

## 2024-01-26 DIAGNOSIS — E11319 Type 2 diabetes mellitus with unspecified diabetic retinopathy without macular edema: Secondary | ICD-10-CM

## 2024-01-26 NOTE — Telephone Encounter (Signed)
 Spoke with patient, patient has question about referral  Informed patient referral placed the nurse form home health will contact them

## 2024-01-26 NOTE — Telephone Encounter (Signed)
 We can put in a referral to home health if they are ok with that.  Jinny Mounts. Daneil Dunker, MD 01/26/2024 7:30 AM

## 2024-01-26 NOTE — Telephone Encounter (Signed)
 See note

## 2024-01-26 NOTE — Telephone Encounter (Signed)
 Please see previous note. We can refer to home health.  Jinny Mounts. Daneil Dunker, MD 01/26/2024 11:56 AM

## 2024-01-26 NOTE — Therapy (Signed)
 OUTPATIENT PHYSICAL THERAPY LOWER EXTREMITY TREATMENT  Patient Name: Jeffrey [name] MRN: [id number] DOB:20-Sep-1955, 68 y.o., male Today's Date: 01/26/2024    END OF SESSION:  PT End of Session - 01/26/24 1145     Visit Number 35    Date for PT Re-Evaluation 03/01/24    Authorization Type HTA    Progress Note Due on Visit 40    PT Start Time 1055    PT Stop Time 1138    PT Time Calculation (min) 43 min    Equipment Utilized During Treatment Gait belt    Activity Tolerance Patient tolerated treatment well    Behavior During Therapy WFL for tasks assessed/performed               Past Medical History:  Diagnosis Date   Cardiac arrest ([name]) 05/22/2016   Cataract    CHF (congestive heart failure) ([name])    CKD (chronic kidney disease) stage 3, GFR 30-59 ml/min ([name]) 05/28/2021   CKD (chronic kidney disease) stage 4, GFR 15-29 ml/min ([name]) 05/28/2021   Diabetes mellitus without complication ([name])    type 2   Diabetic retinopathy ([name])    Hyperlipidemia    Hypertension    Memory loss    mild   Myocardial infarct ([name]) 2105   Retinopathy    Both eyes   S/P primary angioplasty with coronary stent 05/22/2016   Sleep-disordered breathing 12/31/2023   Vitamin B12 deficiency    Vitreous hemorrhage of left eye ([name])    proliferative diabetic retinopathy   Past Surgical History:  Procedure Laterality Date   ANGIOPLASTY     2015   COLONOSCOPY     multiple   EYE SURGERY     PARS PLANA VITRECTOMY Left 03/15/2020   Procedure: PARS PLANA VITRECTOMY WITH 25 GAUGE;  Surgeon: Linard Reno, MD;  Location: [facility] OR;  Service: Ophthalmology;  Laterality: Left;   PHOTOCOAGULATION WITH LASER Left 03/15/2020   Procedure: PHOTOCOAGULATION WITH LASER; INTRAVITREAL INJECTION OF AVASTIN ;  Surgeon: Linard Reno, MD;  Location: [facility] OR;  Service: Ophthalmology;  Laterality: Left;   REFRACTIVE SURGERY     UPPER GI ENDOSCOPY     several   VITRECTOMY     Patient Active Problem List   Diagnosis  Date Noted   Sleep-disordered breathing 12/31/2023   Actinic keratosis 11/30/2023   Vitamin D  deficiency 11/30/2023   Rhinitis 05/20/2023   Cervical spondylosis 08/26/2022   CKD (chronic kidney disease) stage 4, GFR 15-29 ml/min ([name]) 05/28/2021   Adjustment disorder 01/22/2021   Chronic back pain 10/22/2020   Peripheral vertigo 05/15/2020   Dermatitis 01/04/2020   Unintentional weight loss with loose stools 05/19/2019   Low vitamin B12 level 03/29/2019   Heme positive stool 03/14/2019   Normocytic anemia 03/14/2019   Chronic diastolic heart failure ([name]) 02/08/2019   Diabetic retinopathy ([name]) 09/08/2018   Physical debility 05/24/2018   Varicose veins of both lower extremities 03/01/2018   Fatigue due to exertion 10/14/2017   GERD (gastroesophageal reflux disease) 08/25/2017   CAD in native artery 04/14/2017   Hyperlipidemia associated with type 2 diabetes mellitus ([name]) 04/14/2017   Diabetic peripheral neuropathy associated with type 2 diabetes mellitus ([name]) 08/17/2016   [name] 05/02/2016   Hypertension associated with diabetes ([name]) 05/02/2016    PCP: Rodney Clamp, MD   REFERRING PROVIDER: Rodney Clamp, MD   REFERRING DIAG: R53.81 (ICD-10-CM) - Physical debility   THERAPY DIAG:  Muscle weakness (generalized)  Other abnormalities of  gait and mobility  Difficulty in walking, not elsewhere classified  Rationale for Evaluation and Treatment: Rehabilitation  ONSET DATE: 2022  SUBJECTIVE:   SUBJECTIVE STATEMENT: Patient reports he is more tired today. He is still waiting for results of his test.    Arrives via transport chair Brother: [name] identical twins  PERTINENT HISTORY: 3/11: evidence of lumbar compression deformity Compression fx L1 2022, CAD, CKD (stage 4), DM, cardiac arrest 2017, neuropathy, retinopathy Long history of gait abnormality beginning back in 2022. He has a history of CAD and believes that his debility  began following a covid infection. In December odf 2022 he fell and suffered a compression fracture of L1.  PAIN:  Are you having pain? no  PRECAUTIONS: Fall  RED FLAGS: None   WEIGHT BEARING RESTRICTIONS: No  FALLS:  Has patient fallen in last 6 months? No and with sit to stand falls back sometimes  LIVING ENVIRONMENT: Lives with: lives with their family Lives in: House/apartment Stairs: No Has following equipment at home: Single point cane, Environmental consultant - 2 wheeled, Environmental consultant - 4 wheeled, shower chair, Grab bars, and stand up walker also; also has shower chair at sink for shaving, brushing teeth etc, also has transporter.  OCCUPATION: retired  PLOF: Independent with household mobility with device  PATIENT GOALS: to walk without a walker  NEXT MD VISIT: 09/28/22  OBJECTIVE:  Note: Objective measures were completed at Evaluation unless otherwise noted.  DIAGNOSTIC FINDINGS: N/A  COGNITION: Overall cognitive status: Within functional limits for tasks assessed     SENSATION: Neuropathy in B feet   MUSCLE LENGTH: Marked HS R>L, hip flexor tightness   POSTURE: rounded shoulders and forward head  LOWER EXTREMITY ROM: WFL for tasks assessed  LOWER EXTREMITY MMT: *tested in hooklying  MMT Right eval Left eval 4/1  5/27  Hip flexion 4 4 4  Bil 4 Bil  Hip extension Able to bridge Able to bridge 4- Bil 4- Bil  Hip abduction 4-* 4-* 4- Bil 4- Bil  Hip adduction      Hip internal rotation      Hip external rotation      Knee flexion      Knee extension 4 4+ 4- Bil 4- Bil  Ankle dorsiflexion 4+ 4- 4- Bil 4- Bil  Ankle plantarflexion      Ankle inversion      Ankle eversion       (Blank rows = not tested)  3/20:  max UE assist to rise sit to stand; LE hip extension, knee extension grossly 3+ to 4-/5  FUNCTIONAL TESTS:  30 seconds chair stand test Timed up and go (TUG): 59.36 with 4WW Stance time: 45 sec patient reports unsteadiness and feels he goes  backwards 07/27/2023 92 ft decreased cadence; decreased step length; absent heel strike  12/26: 5x STS: unable to rise without heavy UE use; attempted to use armrests for test but does not come all the way up;  next trial with chair +cushion to touch at least one RW handle 48.2 sec:   TUG: cushion in seat +arm use and RW:  52.49 sec  08/13/23 attempted 5x STS but pt unable to complete: 1st rep does not come all the way up and then plops heavily into the transport wheelchair (he then recounts how this happened at home and how the chair tipped backwards) unable to complete reps Unable to complete TUG today, he does not have his 4 wheeled walker today and feels he is slower with the  clinic RW  09/15/2023 TUG: 1:03 with rollator 5STS:48.55 sec heavy reliance on UE support; last repetition was a challenge  3/20: Heavy UE reliance with STS (unable to push up from [facility] armrests, pulls up from // bars) 4/1: max UE assist with sit to stand  5/27:  5x STS no cushion: able to complete 4 reps 47.19 sec            5x STS with cushion 22.5 seat height 5x 43.09 sec able to push up from [facility] armrests rather than // bars (much less reliance on Ues to rise)  GAIT: Distance walked: 25 Assistive device utilized: Walker - 4 wheeled Level of assistance: Modified independence Comments: slow speed, decreased step and stride, decreased heel strike, forward trunk lean.  TRANSFERS: Sit to stand: Uses walker to pull up; stand to sit: does not fully back up to chair and does not reach back; Supine to sit: needs min A for log roll  (has some back pain with sit <>supine due to sitting upward from supine) also fear of falling from bed, sit to supine needs min A to lift legs on to mat table The Patient-Specific Functional Scale  Initial:  I am going to ask you to identify up to 3 important activities that you are unable to do or are having difficulty with as a result of this problem.  Today are there any activities that  you are unable to do or having difficulty with because of this?  (Patient shown scale and patient rated each activity)  Follow up: When you first came in you had difficulty performing these activities.  Today do you still have difficulty?  Patient-Specific activity scoring scheme (Point to one number):  0 1 2 3 4 5 6 7 8 9  10 Unable                                                                                                          Able to perform To perform                                                                                                    activity at the same Activity         Level as before  Injury or problem  Activity            Getting out of the recliner                                                                     Initial:       3                5/27:  4 2.                   Walking with RW 40 feet                                                                Initial:           1              3.                          Standing to brush teeth                                                           Initial:       0 (has to do sitting on shower chair)     5/27: 3            4. Putting on socks and shoes  while sitting                                                      initial 2-3  TODAY'S TREATMENT:  01/26/24: PT had to provide min A to assist patient in getting out of the car Gait training in // bars x 6 mins (working on step length & arm swing); side stepping in // bars x1 laps Seated LAQ 2# 10x2 right/left; bil lift 30 sec hold Sated march 2# 10x right/left  Seated biceps curl 10# x 10 bilateral  Seated dual cable  bil rows  10# on each side 10x  NuStep Level 3 2 mins LE only - PT present to discuss status. Patient verbalized not feeling well so stopped PT had to provide min A to assist patient in getting out of the  car     01/21/24: Gait belt on for safety: 1st round 13 min, 2nd round 10 min 3x STS  with cushion pushing up from W/C armrests and transfer hands to // bars Inside // bars: UE reaches single side reaches 5x each Inside // bars: marching Inside // bars: forward and lateral step overs theraband line on the floor Circle toe taps in rainbow formation inside //bars 3 rounds of 6 Gait  in // bars forward and backward: working on step length and armswing 2 rounds Seated dual cable  bil rows  10# on each side 10x  Seated LAQ 2# 10x2 right/left; bil lift 30 sec hold Sated march 2# 10x right/left  Seated 2# weights cross legs 3x each side  01/12/24: Gait belt on for safety: 1st round 10 min, 2nd round 10 min 5x STS 3 rounds with cushion pushing up from W/C armrests and transfer hands to // bars Inside // bars: UE reaches single and bil reaches 5x each Inside // bars: reach down to touch green physioball with 2 hands Circle toe taps in rainbow formation inside //bars 3 rounds of 6 Gait in // bars forward and backward: working on step length and armswing 2 rounds Seated dual cable rows  10# on each side 10x  Nu-Step L3 Les only  5 min     PATIENT EDUCATION:  Education details: PT eval findings, anticipated POC, initial HEP, postural awareness, and transfer safety  Person educated: Patient and brother Education method: Explanation, Demonstration, and Handouts Education comprehension: verbalized understanding and returned demonstration  HOME EXERCISE PROGRAM: Access Code: [email or web address] URL: [email or web address] Date: 09/15/2023 Prepared by: Penelope Bowie  Exercises - Sit to Stand with Counter Support  - 3 x daily - 3-4 x weekly - 1 sets - 1-5 reps - Hooklying Clamshell with Resistance  - 2 x daily - 3-4 x weekly - 1 sets - 10 reps - Seated Long Arc Quad  - 1 x daily - 7 x weekly - 1-3 sets - 10 reps - 5 sec hold - Standing Hip Abduction with Counter Support  - 1 x daily - 7 x  weekly - 1-2 sets - 10 reps - Standing March with Counter Support  - 1 x daily - 7 x weekly - 1-2 sets - 10 reps - Standing Heel Raise with Support  - 1 x daily - 7 x weekly - 1-2 sets - 10 reps - Shoulder External Rotation and Scapular Retraction with Resistance  - 1 x daily - 7 x weekly - 2 sets - 10 reps - Sit to Stand with Armchair  - 1 x daily - 7 x weekly - 1 sets - 5 reps  Patient Education - Tips to reduce freezing episodes with standing or walking   ASSESSMENT:  CLINICAL IMPRESSION: Truong presents to skilled therapy with increased fatigue today. He has not been sleeping well at night. PT provided min A to help patient complete car transfer. Patient needed frequent rest breaks due to fatigue. He tolerated all his normal exercises despite his increased fatigue. Educated patient on the benefit of going outside to get vitamin D  and change his environment. His brother will be having a surgery Monday, so he will have a transport service bringing him to and from therapy for a few weeks. Patient will benefit from skilled PT to address the below impairments and improve overall function.   OBJECTIVE IMPAIRMENTS: Abnormal gait, decreased balance, decreased ROM, decreased strength, decreased safety awareness, dizziness, impaired flexibility, impaired sensation, postural dysfunction, and pain.   ACTIVITY LIMITATIONS: carrying, lifting, bending, standing, squatting, stairs, transfers, bed mobility, continence, bathing, and locomotion level  PARTICIPATION LIMITATIONS: shopping and community activity  PERSONAL FACTORS: Age, Fitness, Time since onset of injury/illness/exacerbation, and 3+ comorbidities: Compression fx L1 2022, CAD, CKD (stage 4), neuropathy, retinopathy are also affecting patient's functional outcome.   REHAB POTENTIAL: Good  CLINICAL DECISION MAKING: Unstable/unpredictable  EVALUATION COMPLEXITY: High   GOALS: Goals  reviewed with patient? Yes  SHORT TERM GOALS: Target  date: 07/30/23 Improved TUG to < 25 sec to decrease fall risk.  Baseline: Goal status: deferred  2.  Able to perform 5XSTS without UE assist and controlled descent showing improved LE strength.  Baseline:  Goal status: deferred 3.  Patient will be able to stand safely for 2 min without UE assist Baseline:  Goal status: met 5/27  LONG TERM GOALS: Target date: 03/01/2024   Ind with advanced HEP Baseline:  Goal status: IN PROGRESS   2.  Sit to stand from the recliner with PSFS score of 5 Baseline:  Goal status: partially met   3.  Gait with RW from the bedroom to the sunroom 40-50 feet with PSFS of 3 Baseline:  Goal status: ongoing  4. Improved LE mobility needed for greater ease with putting on socks and shoes in sitting with PSFS of 5 Baseline:  Goal status: ongoing  5.  Patient will be able to stand long enough to brush teeth PSFS of 2 Baseline:  Goal status: met 5/27  6. Able to rise from 22 inch seat height push from the armrests to use the new chairs in the living room (in July) New  7. Patient will be able to stand/walk for 13 minutes needed fro grooming and dressing tasks at home   PLAN:  PT FREQUENCY: 2x week  PT DURATION: 8 weeks  PLANNED INTERVENTIONS: 97164- PT Re-evaluation, 97110-Therapeutic exercises, 97530- Therapeutic activity, 97112- Neuromuscular re-education, 97535- Self Care, 16109- Manual therapy, [phone]- Gait training, [phone]- Aquatic Therapy, 97014- Electrical stimulation (unattended), Patient/Family education, Balance training, Stair training, Taping, Joint mobilization, Spinal mobilization, DME instructions, Cryotherapy, and Moist heat  PLAN FOR NEXT SESSION: awaiting MRI results to check for NPH;  Nu-Step legs only; dual cable rows;  sit to stand from foam pad; static standing with teal power cord isometric trunk extension;  UE movements while standing for ADLs; gait in // bars working on standing erect, longer step lengths;  assess energy levels  and endurance ; functional strengthening; no leg press  Penelope Bowie, PT 01/26/24 11:46 AM

## 2024-01-27 ENCOUNTER — Encounter: Payer: Self-pay | Admitting: Family Medicine

## 2024-01-28 ENCOUNTER — Ambulatory Visit: Admitting: Physical Therapy

## 2024-01-29 ENCOUNTER — Encounter: Payer: Self-pay | Admitting: Family Medicine

## 2024-01-29 NOTE — Progress Notes (Signed)
 His MRI shows that he may have extra fluid in his central nervous system.  This is not an urgent concern but it would be a good idea for him to see a neurologist to discuss ways to remove the fluid which may help some with his weakness and instability.  Please place referral if they are agreeable.

## 2024-02-01 NOTE — Telephone Encounter (Signed)
 Spoke with patient on Friday, Home Health will call with information  Verbalized understanding

## 2024-02-01 NOTE — Telephone Encounter (Signed)
 A neurologist is a specialist who focuses on the nervous system such as the brain and spinal cord.  They are not specifically looking at the veins in the head however they will help us  determine if there is too much fluid.

## 2024-02-01 NOTE — Telephone Encounter (Signed)
**Note De-identified  [place] Obfuscation** Please advise 

## 2024-02-02 ENCOUNTER — Telehealth: Payer: Self-pay | Admitting: *Deleted

## 2024-02-02 ENCOUNTER — Ambulatory Visit: Admitting: Physical Therapy

## 2024-02-02 DIAGNOSIS — M6281 Muscle weakness (generalized): Secondary | ICD-10-CM | POA: Diagnosis not present

## 2024-02-02 DIAGNOSIS — R2689 Other abnormalities of gait and mobility: Secondary | ICD-10-CM

## 2024-02-02 DIAGNOSIS — R262 Difficulty in walking, not elsewhere classified: Secondary | ICD-10-CM

## 2024-02-02 NOTE — Therapy (Signed)
 OUTPATIENT PHYSICAL THERAPY LOWER EXTREMITY TREATMENT  Patient Name: Jeffrey [name] MRN: [id number] DOB:14-Aug-1955, 68 y.o., male Today's Date: 02/02/2024    END OF SESSION:  PT End of Session - 02/02/24 1450     Visit Number 36    Date for PT Re-Evaluation 03/01/24    Authorization Type HTA    Progress Note Due on Visit 40    PT Start Time 1446    PT Stop Time 1529    PT Time Calculation (min) 43 min    Equipment Utilized During Treatment Gait belt    Activity Tolerance Patient tolerated treatment well               Past Medical History:  Diagnosis Date   Cardiac arrest ([name]) 05/22/2016   Cataract    CHF (congestive heart failure) ([name])    CKD (chronic kidney disease) stage 3, GFR 30-59 ml/min ([name]) 05/28/2021   CKD (chronic kidney disease) stage 4, GFR 15-29 ml/min ([name]) 05/28/2021   Diabetes mellitus without complication ([name])    type 2   Diabetic retinopathy ([name])    Hyperlipidemia    Hypertension    Memory loss    mild   Myocardial infarct ([name]) 2105   Retinopathy    Both eyes   S/P primary angioplasty with coronary stent 05/22/2016   Sleep-disordered breathing 12/31/2023   Vitamin B12 deficiency    Vitreous hemorrhage of left eye ([name])    proliferative diabetic retinopathy   Past Surgical History:  Procedure Laterality Date   ANGIOPLASTY     2015   COLONOSCOPY     multiple   EYE SURGERY     PARS PLANA VITRECTOMY Left 03/15/2020   Procedure: PARS PLANA VITRECTOMY WITH 25 GAUGE;  Surgeon: Jarold Mayo, MD;  Location: [facility] OR;  Service: Ophthalmology;  Laterality: Left;   PHOTOCOAGULATION WITH LASER Left 03/15/2020   Procedure: PHOTOCOAGULATION WITH LASER; INTRAVITREAL INJECTION OF AVASTIN ;  Surgeon: Jarold Mayo, MD;  Location: [facility] OR;  Service: Ophthalmology;  Laterality: Left;   REFRACTIVE SURGERY     UPPER GI ENDOSCOPY     several   VITRECTOMY     Patient Active Problem List   Diagnosis Date Noted   Sleep-disordered breathing 12/31/2023    Actinic keratosis 11/30/2023   Vitamin D  deficiency 11/30/2023   Rhinitis 05/20/2023   Cervical spondylosis 08/26/2022   CKD (chronic kidney disease) stage 4, GFR 15-29 ml/min ([name]) 05/28/2021   Adjustment disorder 01/22/2021   Chronic back pain 10/22/2020   Peripheral vertigo 05/15/2020   Dermatitis 01/04/2020   Unintentional weight loss with loose stools 05/19/2019   Low vitamin B12 level 03/29/2019   Heme positive stool 03/14/2019   Normocytic anemia 03/14/2019   Chronic diastolic heart failure ([name]) 02/08/2019   Diabetic retinopathy ([name]) 09/08/2018   Physical debility 05/24/2018   Varicose veins of both lower extremities 03/01/2018   Fatigue due to exertion 10/14/2017   GERD (gastroesophageal reflux disease) 08/25/2017   CAD in native artery 04/14/2017   Hyperlipidemia associated with type 2 diabetes mellitus ([name]) 04/14/2017   Diabetic peripheral neuropathy associated with type 2 diabetes mellitus ([name]) 08/17/2016   [name] 05/02/2016   Hypertension associated with diabetes ([name]) 05/02/2016    PCP: Kennyth Worth HERO, MD   REFERRING PROVIDER: Kennyth Worth HERO, MD   REFERRING DIAG: R53.81 (ICD-10-CM) - Physical debility   THERAPY DIAG:  Muscle weakness (generalized)  Other abnormalities of gait and mobility  Difficulty in walking, not elsewhere classified  Rationale for Evaluation and Treatment: Rehabilitation  ONSET DATE: 2022  SUBJECTIVE:   SUBJECTIVE STATEMENT: Patient waiting on neuro consult (for NPH).  Jeffrey [name]'s surgery went OK but he can't lift for weeks.  Did fine getting here with new transportation.  States it's just different getting out of the [place].     Arrives via transport chair assisted by Jeffrey [name]  Brother: [name] identical twins  PERTINENT HISTORY: 3/11: evidence of lumbar compression deformity Compression fx L1 2022, CAD, CKD (stage 4), DM, cardiac arrest 2017, neuropathy, retinopathy Long history of gait  abnormality beginning back in 2022. He has a history of CAD and believes that his debility began following a covid infection. In December odf 2022 he fell and suffered a compression fracture of L1.  PAIN:  Are you having pain? no  PRECAUTIONS: Fall  RED FLAGS: None   WEIGHT BEARING RESTRICTIONS: No  FALLS:  Has patient fallen in last 6 months? No and with sit to stand falls back sometimes  LIVING ENVIRONMENT: Lives with: lives with their family Lives in: House/apartment Stairs: No Has following equipment at home: Single point cane, Environmental consultant - 2 wheeled, Environmental consultant - 4 wheeled, shower chair, Grab bars, and stand up walker also; also has shower chair at sink for shaving, brushing teeth etc, also has transporter.  OCCUPATION: retired  PLOF: Independent with household mobility with device  PATIENT GOALS: to walk without a walker  NEXT MD VISIT: 09/28/22  OBJECTIVE:  Note: Objective measures were completed at Evaluation unless otherwise noted.  DIAGNOSTIC FINDINGS: N/A  COGNITION: Overall cognitive status: Within functional limits for tasks assessed     SENSATION: Neuropathy in B feet   MUSCLE LENGTH: Marked HS R>L, hip flexor tightness   POSTURE: rounded shoulders and forward head  LOWER EXTREMITY ROM: WFL for tasks assessed  LOWER EXTREMITY MMT: *tested in hooklying  MMT Right eval Left eval 4/1  5/27  Hip flexion 4 4 4  Bil 4 Bil  Hip extension Able to bridge Able to bridge 4- Bil 4- Bil  Hip abduction 4-* 4-* 4- Bil 4- Bil  Hip adduction      Hip internal rotation      Hip external rotation      Knee flexion      Knee extension 4 4+ 4- Bil 4- Bil  Ankle dorsiflexion 4+ 4- 4- Bil 4- Bil  Ankle plantarflexion      Ankle inversion      Ankle eversion       (Blank rows = not tested)  3/20:  max UE assist to rise sit to stand; LE hip extension, knee extension grossly 3+ to 4-/5  FUNCTIONAL TESTS:  30 seconds chair stand test Timed up and go (TUG): 59.36 with  4WW Stance time: 45 sec patient reports unsteadiness and feels he goes backwards 07/27/2023 92 ft decreased cadence; decreased step length; absent heel strike  12/26: 5x STS: unable to rise without heavy UE use; attempted to use armrests for test but does not come all the way up;  next trial with chair +cushion to touch at least one RW handle 48.2 sec:   TUG: cushion in seat +arm use and RW:  52.49 sec  08/13/23 attempted 5x STS but pt unable to complete: 1st rep does not come all the way up and then plops heavily into the transport wheelchair (he then recounts how this happened at home and how the chair tipped backwards) unable to complete reps Unable to complete TUG today, he does  not have his 4 wheeled walker today and feels he is slower with the clinic RW  09/15/2023 TUG: 1:03 with rollator 5STS:48.55 sec heavy reliance on UE support; last repetition was a challenge  3/20: Heavy UE reliance with STS (unable to push up from [facility] armrests, pulls up from // bars) 4/1: max UE assist with sit to stand  5/27:  5x STS no cushion: able to complete 4 reps 47.19 sec            5x STS with cushion 22.5 seat height 5x 43.09 sec able to push up from [facility] armrests rather than // bars (much less reliance on Ues to rise)  GAIT: Distance walked: 25 Assistive device utilized: Walker - 4 wheeled Level of assistance: Modified independence Comments: slow speed, decreased step and stride, decreased heel strike, forward trunk lean.  TRANSFERS: Sit to stand: Uses walker to pull up; stand to sit: does not fully back up to chair and does not reach back; Supine to sit: needs min A for log roll  (has some back pain with sit <>supine due to sitting upward from supine) also fear of falling from bed, sit to supine needs min A to lift legs on to mat table The Patient-Specific Functional Scale  Initial:  I am going to ask you to identify up to 3 important activities that you are unable to do or are having difficulty  with as a result of this problem.  Today are there any activities that you are unable to do or having difficulty with because of this?  (Patient shown scale and patient rated each activity)  Follow up: When you first came in you had difficulty performing these activities.  Today do you still have difficulty?  Patient-Specific activity scoring scheme (Point to one number):  0 1 2 3 4 5 6 7 8 9  10 Unable                                                                                                          Able to perform To perform                                                                                                    activity at the same Activity         Level as before  Injury or problem  Activity            Getting out of the recliner                                                                     Initial:       3                5/27:  4 2.                   Walking with RW 40 feet                                                                Initial:           1              3.                          Standing to brush teeth                                                           Initial:       0 (has to do sitting on shower chair)     5/27: 3            4. Putting on socks and shoes  while sitting                                                      initial 2-3  TODAY'S TREATMENT:  02/02/24: Gait belt on for safety: 1st round 12 min, 2nd round 10 min 5x STS  2 sets with cushion pushing up from W/C armrests and transfer hands to // bars Inside // bars: UE reaches single side reaches 5x each Inside // bars: marching Circle toe taps in rainbow formation and trunk rotation /UE reaching inside // bars 2 rounds of 10 Gait in // bars forward and backward: working on step length and arm swing 2 rounds Gait on stepping stones to increase stride length Side  stepping in // bars bil UE Support needed Backwards walk in // bars bil UE support needed Nu-Step L5 5 min UE/LE  01/26/24: PT had to provide min A to assist patient in getting out of the car Gait training in // bars x 6 mins (working on step length & arm swing); side stepping in // bars x1 laps Seated LAQ 2# 10x2 right/left; bil lift 30 sec hold Sated march 2# 10x right/left  Seated biceps curl 10# x 10 bilateral  Seated dual cable  bil rows  10# on each  side 10x  NuStep Level 3 2 mins LE only - PT present to discuss status. Patient verbalized not feeling well so stopped PT had to provide min A to assist patient in getting out of the car     01/21/24: Gait belt on for safety: 1st round 13 min, 2nd round 10 min 3x STS  with cushion pushing up from W/C armrests and transfer hands to // bars Inside // bars: UE reaches single side reaches 5x each Inside // bars: marching Inside // bars: forward and lateral step overs theraband line on the floor Circle toe taps in rainbow formation inside //bars 3 rounds of 6 Gait in // bars forward and backward: working on step length and armswing 2 rounds Seated dual cable  bil rows  10# on each side 10x  Seated LAQ 2# 10x2 right/left; bil lift 30 sec hold Sated march 2# 10x right/left  Seated 2# weights cross legs 3x each side  01/12/24: Gait belt on for safety: 1st round 10 min, 2nd round 10 min 5x STS 3 rounds with cushion pushing up from W/C armrests and transfer hands to // bars Inside // bars: UE reaches single and bil reaches 5x each Inside // bars: reach down to touch green physioball with 2 hands Circle toe taps in rainbow formation inside //bars 3 rounds of 6 Gait in // bars forward and backward: working on step length and armswing 2 rounds Seated dual cable rows  10# on each side 10x  Nu-Step L3 Les only  5 min     PATIENT EDUCATION:  Education details: PT eval findings, anticipated POC, initial HEP, postural awareness, and transfer  safety  Person educated: Patient and brother Education method: Explanation, Demonstration, and Handouts Education comprehension: verbalized understanding and returned demonstration  HOME EXERCISE PROGRAM: Access Code: [email or web address] URL: [email or web address] Date: 09/15/2023 Prepared by: Kristeen Sar  Exercises - Sit to Stand with Counter Support  - 3 x daily - 3-4 x weekly - 1 sets - 1-5 reps - Hooklying Clamshell with Resistance  - 2 x daily - 3-4 x weekly - 1 sets - 10 reps - Seated Long Arc Quad  - 1 x daily - 7 x weekly - 1-3 sets - 10 reps - 5 sec hold - Standing Hip Abduction with Counter Support  - 1 x daily - 7 x weekly - 1-2 sets - 10 reps - Standing March with Counter Support  - 1 x daily - 7 x weekly - 1-2 sets - 10 reps - Standing Heel Raise with Support  - 1 x daily - 7 x weekly - 1-2 sets - 10 reps - Shoulder External Rotation and Scapular Retraction with Resistance  - 1 x daily - 7 x weekly - 2 sets - 10 reps - Sit to Stand with Armchair  - 1 x daily - 7 x weekly - 1 sets - 5 reps  Patient Education - Tips to reduce freezing episodes with standing or walking   ASSESSMENT:  CLINICAL IMPRESSION: Overall good stamina for standing ex's today with 2 rounds of 10-12 minutes on his feet.  He is able to reach overhead with both arms while standing statically but requires UE support with moving his feet. He has difficulty picking up his feet and taking longer steps which is consistent with NPH symptoms.  He is able to transfer with set up assistance only (W/C placed close for stand pivot transfer).  Therapist providing CGA for safety and providing verbal cues for gait pattern.  OBJECTIVE IMPAIRMENTS: Abnormal gait, decreased balance, decreased ROM, decreased strength, decreased safety awareness, dizziness, impaired flexibility, impaired sensation, postural dysfunction, and pain.   ACTIVITY LIMITATIONS: carrying, lifting, bending, standing, squatting, stairs,  transfers, bed mobility, continence, bathing, and locomotion level  PARTICIPATION LIMITATIONS: shopping and community activity  PERSONAL FACTORS: Age, Fitness, Time since onset of injury/illness/exacerbation, and 3+ comorbidities: Compression fx L1 2022, CAD, CKD (stage 4), neuropathy, retinopathy are also affecting patient's functional outcome.   REHAB POTENTIAL: Good  CLINICAL DECISION MAKING: Unstable/unpredictable  EVALUATION COMPLEXITY: High   GOALS: Goals reviewed with patient? Yes  SHORT TERM GOALS: Target date: 07/30/23 Improved TUG to < 25 sec to decrease fall risk.  Baseline: Goal status: deferred  2.  Able to perform 5XSTS without UE assist and controlled descent showing improved LE strength.  Baseline:  Goal status: deferred 3.  Patient will be able to stand safely for 2 min without UE assist Baseline:  Goal status: met 5/27  LONG TERM GOALS: Target date: 03/01/2024   Ind with advanced HEP Baseline:  Goal status: IN PROGRESS   2.  Sit to stand from the recliner with PSFS score of 5 Baseline:  Goal status: partially met   3.  Gait with RW from the bedroom to the sunroom 40-50 feet with PSFS of 3 Baseline:  Goal status: ongoing  4. Improved LE mobility needed for greater ease with putting on socks and shoes in sitting with PSFS of 5 Baseline:  Goal status: ongoing  5.  Patient will be able to stand long enough to brush teeth PSFS of 2 Baseline:  Goal status: met 5/27  6. Able to rise from 22 inch seat height push from the armrests to use the new chairs in the living room (in July) New  7. Patient will be able to stand/walk for 13 minutes needed fro grooming and dressing tasks at home   PLAN:  PT FREQUENCY: 2x week  PT DURATION: 8 weeks  PLANNED INTERVENTIONS: 97164- PT Re-evaluation, 97110-Therapeutic exercises, 97530- Therapeutic activity, 97112- Neuromuscular re-education, 97535- Self Care, [id number]- Manual therapy, [phone]- Gait training, [phone]-  Aquatic Therapy, 97014- Electrical stimulation (unattended), Patient/Family education, Balance training, Stair training, Taping, Joint mobilization, Spinal mobilization, DME instructions, Cryotherapy, and Moist heat  PLAN FOR NEXT SESSION: awaiting neuro consult;  Nu-Step; dual cable rows;  sit to stand from foam pad; static standing with teal power cord isometric trunk extension;  UE movements while standing for ADLs; gait in // bars working on standing erect, longer step lengths;  assess energy levels and endurance ; functional strengthening; no leg press  Glade Pesa, PT 02/02/24 6:03 PM Phone: [phone] Fax: [phone]

## 2024-02-02 NOTE — Telephone Encounter (Signed)
 Patient notified since  they are already receiving outpatient therapy and the order is just for a home health aide - we can do home health services of any kind while the patient is already participating in outpatient.patient verbalized understanding

## 2024-02-03 ENCOUNTER — Encounter: Payer: Self-pay | Admitting: Neurology

## 2024-02-04 ENCOUNTER — Encounter: Payer: Self-pay | Admitting: Physical Therapy

## 2024-02-04 ENCOUNTER — Ambulatory Visit: Admitting: Physical Therapy

## 2024-02-04 DIAGNOSIS — M6281 Muscle weakness (generalized): Secondary | ICD-10-CM | POA: Diagnosis not present

## 2024-02-04 DIAGNOSIS — R262 Difficulty in walking, not elsewhere classified: Secondary | ICD-10-CM

## 2024-02-04 DIAGNOSIS — R2689 Other abnormalities of gait and mobility: Secondary | ICD-10-CM

## 2024-02-04 NOTE — Therapy (Signed)
 OUTPATIENT PHYSICAL THERAPY LOWER EXTREMITY TREATMENT  Patient Name: Jeffrey [name] MRN: [id number] DOB:Sep 07, 1955, 68 y.o., male Today's Date: 02/04/2024    END OF SESSION:  PT End of Session - 02/04/24 1545     Visit Number 37    Date for PT Re-Evaluation 03/01/24    Authorization Type HTA    Progress Note Due on Visit 40    PT Start Time 1448    PT Stop Time 1527    PT Time Calculation (min) 39 min    Activity Tolerance Patient tolerated treatment well    Behavior During Therapy [facility] for tasks assessed/performed                Past Medical History:  Diagnosis Date   Cardiac arrest ([name]) 05/22/2016   Cataract    CHF (congestive heart failure) ([name])    CKD (chronic kidney disease) stage 3, GFR 30-59 ml/min ([name]) 05/28/2021   CKD (chronic kidney disease) stage 4, GFR 15-29 ml/min ([name]) 05/28/2021   Diabetes mellitus without complication ([name])    type 2   Diabetic retinopathy ([name])    Hyperlipidemia    Hypertension    Memory loss    mild   Myocardial infarct ([name]) 2105   Retinopathy    Both eyes   S/P primary angioplasty with coronary stent 05/22/2016   Sleep-disordered breathing 12/31/2023   Vitamin B12 deficiency    Vitreous hemorrhage of left eye ([name])    proliferative diabetic retinopathy   Past Surgical History:  Procedure Laterality Date   ANGIOPLASTY     2015   COLONOSCOPY     multiple   EYE SURGERY     PARS PLANA VITRECTOMY Left 03/15/2020   Procedure: PARS PLANA VITRECTOMY WITH 25 GAUGE;  Surgeon: Jarold Mayo, MD;  Location: [facility] OR;  Service: Ophthalmology;  Laterality: Left;   PHOTOCOAGULATION WITH LASER Left 03/15/2020   Procedure: PHOTOCOAGULATION WITH LASER; INTRAVITREAL INJECTION OF AVASTIN ;  Surgeon: Jarold Mayo, MD;  Location: [facility] OR;  Service: Ophthalmology;  Laterality: Left;   REFRACTIVE SURGERY     UPPER GI ENDOSCOPY     several   VITRECTOMY     Patient Active Problem List   Diagnosis Date Noted   Sleep-disordered breathing  12/31/2023   Actinic keratosis 11/30/2023   Vitamin D  deficiency 11/30/2023   Rhinitis 05/20/2023   Cervical spondylosis 08/26/2022   CKD (chronic kidney disease) stage 4, GFR 15-29 ml/min ([name]) 05/28/2021   Adjustment disorder 01/22/2021   Chronic back pain 10/22/2020   Peripheral vertigo 05/15/2020   Dermatitis 01/04/2020   Unintentional weight loss with loose stools 05/19/2019   Low vitamin B12 level 03/29/2019   Heme positive stool 03/14/2019   Normocytic anemia 03/14/2019   Chronic diastolic heart failure ([name]) 02/08/2019   Diabetic retinopathy ([name]) 09/08/2018   Physical debility 05/24/2018   Varicose veins of both lower extremities 03/01/2018   Fatigue due to exertion 10/14/2017   GERD (gastroesophageal reflux disease) 08/25/2017   CAD in native artery 04/14/2017   Hyperlipidemia associated with type 2 diabetes mellitus ([name]) 04/14/2017   Diabetic peripheral neuropathy associated with type 2 diabetes mellitus ([name]) 08/17/2016   [name] 05/02/2016   Hypertension associated with diabetes ([name]) 05/02/2016    PCP: Kennyth Worth HERO, MD   REFERRING PROVIDER: Kennyth Worth HERO, MD   REFERRING DIAG: R53.81 (ICD-10-CM) - Physical debility   THERAPY DIAG:  Muscle weakness (generalized)  Other abnormalities of gait and mobility  Difficulty in walking, not  elsewhere classified  Rationale for Evaluation and Treatment: Rehabilitation  ONSET DATE: 2022  SUBJECTIVE:   SUBJECTIVE STATEMENT: Patient reports he is doing good today. No new complaints   Arrives via transport chair assisted by [name]  Brother: [name] identical twins  PERTINENT HISTORY: 3/11: evidence of lumbar compression deformity Compression fx L1 2022, CAD, CKD (stage 4), DM, cardiac arrest 2017, neuropathy, retinopathy Long history of gait abnormality beginning back in 2022. He has a history of CAD and believes that his debility began following a covid infection. In December  odf 2022 he fell and suffered a compression fracture of L1.  PAIN:  Are you having pain? no  PRECAUTIONS: Fall  RED FLAGS: None   WEIGHT BEARING RESTRICTIONS: No  FALLS:  Has patient fallen in last 6 months? No and with sit to stand falls back sometimes  LIVING ENVIRONMENT: Lives with: lives with their family Lives in: House/apartment Stairs: No Has following equipment at home: Single point cane, Environmental consultant - 2 wheeled, Environmental consultant - 4 wheeled, shower chair, Grab bars, and stand up walker also; also has shower chair at sink for shaving, brushing teeth etc, also has transporter.  OCCUPATION: retired  PLOF: Independent with household mobility with device  PATIENT GOALS: to walk without a walker  NEXT MD VISIT: 09/28/22  OBJECTIVE:  Note: Objective measures were completed at Evaluation unless otherwise noted.  DIAGNOSTIC FINDINGS: N/A  COGNITION: Overall cognitive status: Within functional limits for tasks assessed     SENSATION: Neuropathy in B feet   MUSCLE LENGTH: Marked HS R>L, hip flexor tightness   POSTURE: rounded shoulders and forward head  LOWER EXTREMITY ROM: WFL for tasks assessed  LOWER EXTREMITY MMT: *tested in hooklying  MMT Right eval Left eval 4/1  5/27  Hip flexion 4 4 4  Bil 4 Bil  Hip extension Able to bridge Able to bridge 4- Bil 4- Bil  Hip abduction 4-* 4-* 4- Bil 4- Bil  Hip adduction      Hip internal rotation      Hip external rotation      Knee flexion      Knee extension 4 4+ 4- Bil 4- Bil  Ankle dorsiflexion 4+ 4- 4- Bil 4- Bil  Ankle plantarflexion      Ankle inversion      Ankle eversion       (Blank rows = not tested)  3/20:  max UE assist to rise sit to stand; LE hip extension, knee extension grossly 3+ to 4-/5  FUNCTIONAL TESTS:  30 seconds chair stand test Timed up and go (TUG): 59.36 with 4WW Stance time: 45 sec patient reports unsteadiness and feels he goes backwards 07/27/2023 92 ft decreased cadence; decreased step  length; absent heel strike  12/26: 5x STS: unable to rise without heavy UE use; attempted to use armrests for test but does not come all the way up;  next trial with chair +cushion to touch at least one RW handle 48.2 sec:   TUG: cushion in seat +arm use and RW:  52.49 sec  08/13/23 attempted 5x STS but pt unable to complete: 1st rep does not come all the way up and then plops heavily into the transport wheelchair (he then recounts how this happened at home and how the chair tipped backwards) unable to complete reps Unable to complete TUG today, he does not have his 4 wheeled walker today and feels he is slower with the clinic RW  09/15/2023 TUG: 1:03 with rollator 5STS:48.55 sec heavy  reliance on UE support; last repetition was a challenge  3/20: Heavy UE reliance with STS (unable to push up from [facility] armrests, pulls up from // bars) 4/1: max UE assist with sit to stand  5/27:  5x STS no cushion: able to complete 4 reps 47.19 sec            5x STS with cushion 22.5 seat height 5x 43.09 sec able to push up from [facility] armrests rather than // bars (much less reliance on Ues to rise)  GAIT: Distance walked: 25 Assistive device utilized: Walker - 4 wheeled Level of assistance: Modified independence Comments: slow speed, decreased step and stride, decreased heel strike, forward trunk lean.  TRANSFERS: Sit to stand: Uses walker to pull up; stand to sit: does not fully back up to chair and does not reach back; Supine to sit: needs min A for log roll  (has some back pain with sit <>supine due to sitting upward from supine) also fear of falling from bed, sit to supine needs min A to lift legs on to mat table The Patient-Specific Functional Scale  Initial:  I am going to ask you to identify up to 3 important activities that you are unable to do or are having difficulty with as a result of this problem.  Today are there any activities that you are unable to do or having difficulty with because of this?   (Patient shown scale and patient rated each activity)  Follow up: When you first came in you had difficulty performing these activities.  Today do you still have difficulty?  Patient-Specific activity scoring scheme (Point to one number):  0 1 2 3 4 5 6 7 8 9  10 Unable                                                                                                          Able to perform To perform                                                                                                    activity at the same Activity         Level as before  Injury or problem  Activity            Getting out of the recliner                                                                     Initial:       3                5/27:  4 2.                   Walking with RW 40 feet                                                                Initial:           1              3.                          Standing to brush teeth                                                           Initial:       0 (has to do sitting on shower chair)     5/27: 3            4. Putting on socks and shoes  while sitting                                                      initial 2-3  TODAY'S TREATMENT:  02/04/24: Nu-Step L5 5 min UE/LE Gait belt on for safety: 1st round 10 min Seated dual cable  bil rows  10# on each side  x 10 Seated biceps curls at cable 10# x 10 Side stepping in // bars bil UE Support needed Sit to stand from w/c + airex 2 x 5 with UE support Side stepping in // x 2 laps Inside // bars: UE reaches single side reaches 5x each     02/02/24: Gait belt on for safety: 1st round 12 min, 2nd round 10 min 5x STS  2 sets with cushion pushing up from W/C armrests and transfer hands to // bars Inside // bars: UE reaches single side reaches 5x each Inside // bars: marching Circle toe taps  in rainbow formation and trunk rotation /UE reaching inside // bars 2 rounds of 10 Gait in // bars forward and backward: working on step length and arm swing 2 rounds Gait on stepping stones to increase stride length Side stepping in // bars bil UE Support needed Backwards walk in // bars bil UE  support needed Nu-Step L5 5 min UE/LE  01/26/24: PT had to provide min A to assist patient in getting out of the car Gait training in // bars x 6 mins (working on step length & arm swing); side stepping in // bars x1 laps Seated LAQ 2# 10x2 right/left; bil lift 30 sec hold Sated march 2# 10x right/left  Seated biceps curl 10# x 10 bilateral  Seated dual cable  bil rows  10# on each side 10x  NuStep Level 3 2 mins LE only - PT present to discuss status. Patient verbalized not feeling well so stopped PT had to provide min A to assist patient in getting out of the car     01/21/24: Gait belt on for safety: 1st round 13 min, 2nd round 10 min 3x STS  with cushion pushing up from W/C armrests and transfer hands to // bars Inside // bars: UE reaches single side reaches 5x each Inside // bars: marching Inside // bars: forward and lateral step overs theraband line on the floor Circle toe taps in rainbow formation inside //bars 3 rounds of 6 Gait in // bars forward and backward: working on step length and armswing 2 rounds Seated dual cable  bil rows  10# on each side 10x  Seated LAQ 2# 10x2 right/left; bil lift 30 sec hold Sated march 2# 10x right/left  Seated 2# weights cross legs 3x each side    PATIENT EDUCATION:  Education details: PT eval findings, anticipated POC, initial HEP, postural awareness, and transfer safety  Person educated: Patient and brother Education method: Explanation, Demonstration, and Handouts Education comprehension: verbalized understanding and returned demonstration  HOME EXERCISE PROGRAM: Access Code: [email or web address] URL: [email or web address] Date:  09/15/2023 Prepared by: Kristeen Sar  Exercises - Sit to Stand with Counter Support  - 3 x daily - 3-4 x weekly - 1 sets - 1-5 reps - Hooklying Clamshell with Resistance  - 2 x daily - 3-4 x weekly - 1 sets - 10 reps - Seated Long Arc Quad  - 1 x daily - 7 x weekly - 1-3 sets - 10 reps - 5 sec hold - Standing Hip Abduction with Counter Support  - 1 x daily - 7 x weekly - 1-2 sets - 10 reps - Standing March with Counter Support  - 1 x daily - 7 x weekly - 1-2 sets - 10 reps - Standing Heel Raise with Support  - 1 x daily - 7 x weekly - 1-2 sets - 10 reps - Shoulder External Rotation and Scapular Retraction with Resistance  - 1 x daily - 7 x weekly - 2 sets - 10 reps - Sit to Stand with Armchair  - 1 x daily - 7 x weekly - 1 sets - 5 reps  Patient Education - Tips to reduce freezing episodes with standing or walking   ASSESSMENT:  CLINICAL IMPRESSION: Jeffrey [name] presents to therapy with no new complaints. He is still waiting for his appointment with her neurologist. Patient wanted to do the Nustep first today, so his standing tolerance with gait training was not as long today. He verbalized feeling like he was going to fall forward and his head did not feeling normal. Session was ended early due to patient fatigue. Patient will benefit from skilled PT to address the below impairments and improve overall function.    OBJECTIVE IMPAIRMENTS: Abnormal gait, decreased balance, decreased ROM, decreased strength, decreased safety awareness, dizziness, impaired flexibility, impaired sensation, postural dysfunction, and pain.   ACTIVITY  LIMITATIONS: carrying, lifting, bending, standing, squatting, stairs, transfers, bed mobility, continence, bathing, and locomotion level  PARTICIPATION LIMITATIONS: shopping and community activity  PERSONAL FACTORS: Age, Fitness, Time since onset of injury/illness/exacerbation, and 3+ comorbidities: Compression fx L1 2022, CAD, CKD (stage 4), neuropathy, retinopathy  are also affecting patient's functional outcome.   REHAB POTENTIAL: Good  CLINICAL DECISION MAKING: Unstable/unpredictable  EVALUATION COMPLEXITY: High   GOALS: Goals reviewed with patient? Yes  SHORT TERM GOALS: Target date: 07/30/23 Improved TUG to < 25 sec to decrease fall risk.  Baseline: Goal status: deferred  2.  Able to perform 5XSTS without UE assist and controlled descent showing improved LE strength.  Baseline:  Goal status: deferred 3.  Patient will be able to stand safely for 2 min without UE assist Baseline:  Goal status: met 5/27  LONG TERM GOALS: Target date: 03/01/2024   Ind with advanced HEP Baseline:  Goal status: IN PROGRESS   2.  Sit to stand from the recliner with PSFS score of 5 Baseline:  Goal status: partially met   3.  Gait with RW from the bedroom to the sunroom 40-50 feet with PSFS of 3 Baseline:  Goal status: ongoing  4. Improved LE mobility needed for greater ease with putting on socks and shoes in sitting with PSFS of 5 Baseline:  Goal status: ongoing  5.  Patient will be able to stand long enough to brush teeth PSFS of 2 Baseline:  Goal status: met 5/27  6. Able to rise from 22 inch seat height push from the armrests to use the new chairs in the living room (in July) New  7. Patient will be able to stand/walk for 13 minutes needed fro grooming and dressing tasks at home   PLAN:  PT FREQUENCY: 2x week  PT DURATION: 8 weeks  PLANNED INTERVENTIONS: 97164- PT Re-evaluation, 97110-Therapeutic exercises, 97530- Therapeutic activity, 97112- Neuromuscular re-education, 97535- Self Care, [id number]- Manual therapy, [phone]- Gait training, [phone]- Aquatic Therapy, 97014- Electrical stimulation (unattended), Patient/Family education, Balance training, Stair training, Taping, Joint mobilization, Spinal mobilization, DME instructions, Cryotherapy, and Moist heat  PLAN FOR NEXT SESSION: awaiting neuro consult;  Nu-Step; dual cable rows;  sit to  stand from foam pad; static standing with teal power cord isometric trunk extension;  UE movements while standing for ADLs; gait in // bars working on standing erect, longer step lengths;  assess energy levels and endurance ; functional strengthening; no leg press  Glade Pesa, PT 02/04/24 3:46 PM Phone: [phone] Fax: [phone]

## 2024-02-05 ENCOUNTER — Encounter: Admitting: Family Medicine

## 2024-02-09 ENCOUNTER — Ambulatory Visit: Attending: Family Medicine | Admitting: Physical Therapy

## 2024-02-09 DIAGNOSIS — R262 Difficulty in walking, not elsewhere classified: Secondary | ICD-10-CM | POA: Insufficient documentation

## 2024-02-09 DIAGNOSIS — M6281 Muscle weakness (generalized): Secondary | ICD-10-CM | POA: Insufficient documentation

## 2024-02-09 DIAGNOSIS — R2689 Other abnormalities of gait and mobility: Secondary | ICD-10-CM | POA: Diagnosis not present

## 2024-02-09 NOTE — Therapy (Signed)
 OUTPATIENT PHYSICAL THERAPY LOWER EXTREMITY TREATMENT  Patient Name: Januel Doolan MRN: [id number] DOB:Feb 07, 1956, 68 y.o., male Today's Date: 02/09/2024    END OF SESSION:  PT End of Session - 02/09/24 1431     Visit Number 38    Date for PT Re-Evaluation 03/01/24    Authorization Type HTA    Progress Note Due on Visit 40    PT Start Time 1435    PT Stop Time 1518    PT Time Calculation (min) 43 min    Equipment Utilized During Treatment Gait belt    Activity Tolerance Patient tolerated treatment well    Behavior During Therapy WFL for tasks assessed/performed                Past Medical History:  Diagnosis Date   Cardiac arrest ([name]) 05/22/2016   Cataract    CHF (congestive heart failure) ([name])    CKD (chronic kidney disease) stage 3, GFR 30-59 ml/min ([name]) 05/28/2021   CKD (chronic kidney disease) stage 4, GFR 15-29 ml/min ([name]) 05/28/2021   Diabetes mellitus without complication ([name])    type 2   Diabetic retinopathy ([name])    Hyperlipidemia    Hypertension    Memory loss    mild   Myocardial infarct ([name]) 2105   Retinopathy    Both eyes   S/P primary angioplasty with coronary stent 05/22/2016   Sleep-disordered breathing 12/31/2023   Vitamin B12 deficiency    Vitreous hemorrhage of left eye ([name])    proliferative diabetic retinopathy   Past Surgical History:  Procedure Laterality Date   ANGIOPLASTY     2015   COLONOSCOPY     multiple   EYE SURGERY     PARS PLANA VITRECTOMY Left 03/15/2020   Procedure: PARS PLANA VITRECTOMY WITH 25 GAUGE;  Surgeon: Jarold Mayo, MD;  Location: [facility] OR;  Service: Ophthalmology;  Laterality: Left;   PHOTOCOAGULATION WITH LASER Left 03/15/2020   Procedure: PHOTOCOAGULATION WITH LASER; INTRAVITREAL INJECTION OF AVASTIN ;  Surgeon: Jarold Mayo, MD;  Location: [facility] OR;  Service: Ophthalmology;  Laterality: Left;   REFRACTIVE SURGERY     UPPER GI ENDOSCOPY     several   VITRECTOMY     Patient Active Problem List    Diagnosis Date Noted   Sleep-disordered breathing 12/31/2023   Actinic keratosis 11/30/2023   Vitamin D  deficiency 11/30/2023   Rhinitis 05/20/2023   Cervical spondylosis 08/26/2022   CKD (chronic kidney disease) stage 4, GFR 15-29 ml/min ([name]) 05/28/2021   Adjustment disorder 01/22/2021   Chronic back pain 10/22/2020   Peripheral vertigo 05/15/2020   Dermatitis 01/04/2020   Unintentional weight loss with loose stools 05/19/2019   Low vitamin B12 level 03/29/2019   Heme positive stool 03/14/2019   Normocytic anemia 03/14/2019   Chronic diastolic heart failure ([name]) 02/08/2019   Diabetic retinopathy ([name]) 09/08/2018   Physical debility 05/24/2018   Varicose veins of both lower extremities 03/01/2018   Fatigue due to exertion 10/14/2017   GERD (gastroesophageal reflux disease) 08/25/2017   CAD in native artery 04/14/2017   Hyperlipidemia associated with type 2 diabetes mellitus ([name]) 04/14/2017   Diabetic peripheral neuropathy associated with type 2 diabetes mellitus ([name]) 08/17/2016   [name] 05/02/2016   Hypertension associated with diabetes ([name]) 05/02/2016    PCP: Kennyth Worth HERO, MD   REFERRING PROVIDER: Kennyth Worth HERO, MD   REFERRING DIAG: R53.81 (ICD-10-CM) - Physical debility   THERAPY DIAG:  Muscle weakness (generalized)  Other abnormalities  of gait and mobility  Difficulty in walking, not elsewhere classified  Rationale for Evaluation and Treatment: Rehabilitation  ONSET DATE: 2022  SUBJECTIVE:   SUBJECTIVE STATEMENT: Going to see neurologist in August, not sure of the date.  I'm nervous about that appt, I don't want surgery.      Arrives via transport chair assisted by [name]  Brother: [name] identical twins  PERTINENT HISTORY: 3/11: evidence of lumbar compression deformity Compression fx L1 2022, CAD, CKD (stage 4), DM, cardiac arrest 2017, neuropathy, retinopathy Long history of gait abnormality beginning back in  2022. He has a history of CAD and believes that his debility began following a covid infection. In December odf 2022 he fell and suffered a compression fracture of L1.  PAIN:  Are you having pain? no  PRECAUTIONS: Fall  RED FLAGS: None   WEIGHT BEARING RESTRICTIONS: No  FALLS:  Has patient fallen in last 6 months? No and with sit to stand falls back sometimes  LIVING ENVIRONMENT: Lives with: lives with their family Lives in: House/apartment Stairs: No Has following equipment at home: Single point cane, Environmental consultant - 2 wheeled, Environmental consultant - 4 wheeled, shower chair, Grab bars, and stand up walker also; also has shower chair at sink for shaving, brushing teeth etc, also has transporter.  OCCUPATION: retired  PLOF: Independent with household mobility with device  PATIENT GOALS: to walk without a walker  NEXT MD VISIT: 09/28/22  OBJECTIVE:  Note: Objective measures were completed at Evaluation unless otherwise noted.  DIAGNOSTIC FINDINGS: N/A  COGNITION: Overall cognitive status: Within functional limits for tasks assessed     SENSATION: Neuropathy in B feet   MUSCLE LENGTH: Marked HS R>L, hip flexor tightness   POSTURE: rounded shoulders and forward head  LOWER EXTREMITY ROM: WFL for tasks assessed  LOWER EXTREMITY MMT: *tested in hooklying  MMT Right eval Left eval 4/1  5/27  Hip flexion 4 4 4  Bil 4 Bil  Hip extension Able to bridge Able to bridge 4- Bil 4- Bil  Hip abduction 4-* 4-* 4- Bil 4- Bil  Hip adduction      Hip internal rotation      Hip external rotation      Knee flexion      Knee extension 4 4+ 4- Bil 4- Bil  Ankle dorsiflexion 4+ 4- 4- Bil 4- Bil  Ankle plantarflexion      Ankle inversion      Ankle eversion       (Blank rows = not tested)  3/20:  max UE assist to rise sit to stand; LE hip extension, knee extension grossly 3+ to 4-/5  FUNCTIONAL TESTS:  30 seconds chair stand test Timed up and go (TUG): 59.36 with 4WW Stance time: 45 sec  patient reports unsteadiness and feels he goes backwards 07/27/2023 92 ft decreased cadence; decreased step length; absent heel strike  12/26: 5x STS: unable to rise without heavy UE use; attempted to use armrests for test but does not come all the way up;  next trial with chair +cushion to touch at least one RW handle 48.2 sec:   TUG: cushion in seat +arm use and RW:  52.49 sec  08/13/23 attempted 5x STS but pt unable to complete: 1st rep does not come all the way up and then plops heavily into the transport wheelchair (he then recounts how this happened at home and how the chair tipped backwards) unable to complete reps Unable to complete TUG today, he does not have  his 4 wheeled walker today and feels he is slower with the clinic RW  09/15/2023 TUG: 1:03 with rollator 5STS:48.55 sec heavy reliance on UE support; last repetition was a challenge  3/20: Heavy UE reliance with STS (unable to push up from [facility] armrests, pulls up from // bars) 4/1: max UE assist with sit to stand  5/27:  5x STS no cushion: able to complete 4 reps 47.19 sec            5x STS with cushion 22.5 seat height 5x 43.09 sec able to push up from [facility] armrests rather than // bars (much less reliance on Ues to rise)  GAIT: Distance walked: 25 Assistive device utilized: Walker - 4 wheeled Level of assistance: Modified independence Comments: slow speed, decreased step and stride, decreased heel strike, forward trunk lean.  TRANSFERS: Sit to stand: Uses walker to pull up; stand to sit: does not fully back up to chair and does not reach back; Supine to sit: needs min A for log roll  (has some back pain with sit <>supine due to sitting upward from supine) also fear of falling from bed, sit to supine needs min A to lift legs on to mat table The Patient-Specific Functional Scale  Initial:  I am going to ask you to identify up to 3 important activities that you are unable to do or are having difficulty with as a result of this  problem.  Today are there any activities that you are unable to do or having difficulty with because of this?  (Patient shown scale and patient rated each activity)  Follow up: When you first came in you had difficulty performing these activities.  Today do you still have difficulty?  Patient-Specific activity scoring scheme (Point to one number):  0 1 2 3 4 5 6 7 8 9  10 Unable                                                                                                          Able to perform To perform                                                                                                    activity at the same Activity         Level as before  Injury or problem  Activity            Getting out of the recliner                                                                     Initial:       3                5/27:  4 2.                   Walking with RW 40 feet                                                                Initial:           1              3.                          Standing to brush teeth                                                           Initial:       0 (has to do sitting on shower chair)     5/27: 3            4. Putting on socks and shoes  while sitting                                                      initial 2-3  TODAY'S TREATMENT:  02/09/24: Gait belt on for safety: 1st round 10 min, 2nd round 8 min Dynamic warmup: head turns, UE reaching overhead, UE reaching low, LE taps forward and lateral Beach ball pass 2 min inside // bars (CGA on gait belt but no loss of balance requiring physical assist) Standing Trunk rotation with the beach ball 8x Seated dual cable  bil rows  10# on each side  x 10 Sit to stand from w/c + airex 2 x 5 with UE support Side stepping in // x 2 laps Backwards walk in // bars 2 laps High step walking in  // bars 2 laps Gait in // bars emphasizing longer steps, arm swing Nu-Step L3 5 min UE/LE RPE 4/10 (low)   02/04/24: Nu-Step L5 5 min UE/LE Gait belt on for safety: 1st round 10 min Seated dual cable  bil rows  10# on each side  x 10 Seated biceps curls at cable 10# x 10 Side stepping in // bars bil UE Support needed Sit to stand from w/c + airex 2 x 5 with UE support  Side stepping in // x 2 laps Inside // bars: UE reaches single side reaches 5x each     02/02/24: Gait belt on for safety: 1st round 12 min, 2nd round 10 min 5x STS  2 sets with cushion pushing up from W/C armrests and transfer hands to // bars Inside // bars: UE reaches single side reaches 5x each Inside // bars: marching Circle toe taps in rainbow formation and trunk rotation /UE reaching inside // bars 2 rounds of 10 Gait in // bars forward and backward: working on step length and arm swing 2 rounds Gait on stepping stones to increase stride length Side stepping in // bars bil UE Support needed Backwards walk in // bars bil UE support needed Nu-Step L5 5 min UE/LE  01/26/24: PT had to provide min A to assist patient in getting out of the car Gait training in // bars x 6 mins (working on step length & arm swing); side stepping in // bars x1 laps Seated LAQ 2# 10x2 right/left; bil lift 30 sec hold Sated march 2# 10x right/left  Seated biceps curl 10# x 10 bilateral  Seated dual cable  bil rows  10# on each side 10x  NuStep Level 3 2 mins LE only - PT present to discuss status. Patient verbalized not feeling well so stopped PT had to provide min A to assist patient in getting out of the car     01/21/24: Gait belt on for safety: 1st round 13 min, 2nd round 10 min 3x STS  with cushion pushing up from W/C armrests and transfer hands to // bars Inside // bars: UE reaches single side reaches 5x each Inside // bars: marching Inside // bars: forward and lateral step overs theraband line on the floor Circle  toe taps in rainbow formation inside //bars 3 rounds of 6 Gait in // bars forward and backward: working on step length and armswing 2 rounds Seated dual cable  bil rows  10# on each side 10x  Seated LAQ 2# 10x2 right/left; bil lift 30 sec hold Sated march 2# 10x right/left  Seated 2# weights cross legs 3x each side    PATIENT EDUCATION:  Education details: PT eval findings, anticipated POC, initial HEP, postural awareness, and transfer safety  Person educated: Patient and brother Education method: Explanation, Demonstration, and Handouts Education comprehension: verbalized understanding and returned demonstration  HOME EXERCISE PROGRAM: Access Code: [email or web address] URL: [email or web address] Date: 09/15/2023 Prepared by: Kristeen Sar  Exercises - Sit to Stand with Counter Support  - 3 x daily - 3-4 x weekly - 1 sets - 1-5 reps - Hooklying Clamshell with Resistance  - 2 x daily - 3-4 x weekly - 1 sets - 10 reps - Seated Long Arc Quad  - 1 x daily - 7 x weekly - 1-3 sets - 10 reps - 5 sec hold - Standing Hip Abduction with Counter Support  - 1 x daily - 7 x weekly - 1-2 sets - 10 reps - Standing March with Counter Support  - 1 x daily - 7 x weekly - 1-2 sets - 10 reps - Standing Heel Raise with Support  - 1 x daily - 7 x weekly - 1-2 sets - 10 reps - Shoulder External Rotation and Scapular Retraction with Resistance  - 1 x daily - 7 x weekly - 2 sets - 10 reps - Sit to Stand with Armchair  - 1 x daily - 7 x weekly - 1 sets - 5 reps  Patient Education - Tips to reduce freezing episodes with standing or walking   ASSESSMENT:  CLINICAL IMPRESSION: Able to perform static balance challenge (beach ball pass) without loss of balance. Good step lengths and toe clearance initially but lessened with fatigue.  Needs UE support with turns and LE movements but can stabilize for UE movements.  CGA for safety with all standing ex's and transfers.      OBJECTIVE IMPAIRMENTS: Abnormal  gait, decreased balance, decreased ROM, decreased strength, decreased safety awareness, dizziness, impaired flexibility, impaired sensation, postural dysfunction, and pain.   ACTIVITY LIMITATIONS: carrying, lifting, bending, standing, squatting, stairs, transfers, bed mobility, continence, bathing, and locomotion level  PARTICIPATION LIMITATIONS: shopping and community activity  PERSONAL FACTORS: Age, Fitness, Time since onset of injury/illness/exacerbation, and 3+ comorbidities: Compression fx L1 2022, CAD, CKD (stage 4), neuropathy, retinopathy are also affecting patient's functional outcome.   REHAB POTENTIAL: Good  CLINICAL DECISION MAKING: Unstable/unpredictable  EVALUATION COMPLEXITY: High   GOALS: Goals reviewed with patient? Yes  SHORT TERM GOALS: Target date: 07/30/23 Improved TUG to < 25 sec to decrease fall risk.  Baseline: Goal status: deferred  2.  Able to perform 5XSTS without UE assist and controlled descent showing improved LE strength.  Baseline:  Goal status: deferred 3.  Patient will be able to stand safely for 2 min without UE assist Baseline:  Goal status: met 5/27  LONG TERM GOALS: Target date: 03/01/2024   Ind with advanced HEP Baseline:  Goal status: IN PROGRESS   2.  Sit to stand from the recliner with PSFS score of 5 Baseline:  Goal status: partially met   3.  Gait with RW from the bedroom to the sunroom 40-50 feet with PSFS of 3 Baseline:  Goal status: ongoing  4. Improved LE mobility needed for greater ease with putting on socks and shoes in sitting with PSFS of 5 Baseline:  Goal status: ongoing  5.  Patient will be able to stand long enough to brush teeth PSFS of 2 Baseline:  Goal status: met 5/27  6. Able to rise from 22 inch seat height push from the armrests to use the new chairs in the living room (in July) New  7. Patient will be able to stand/walk for 13 minutes needed fro grooming and dressing tasks at home   PLAN:  PT  FREQUENCY: 2x week  PT DURATION: 8 weeks  PLANNED INTERVENTIONS: 97164- PT Re-evaluation, 97110-Therapeutic exercises, 97530- Therapeutic activity, 97112- Neuromuscular re-education, 97535- Self Care, [id number]- Manual therapy, [phone]- Gait training, [phone]- Aquatic Therapy, 97014- Electrical stimulation (unattended), Patient/Family education, Balance training, Stair training, Taping, Joint mobilization, Spinal mobilization, DME instructions, Cryotherapy, and Moist heat  PLAN FOR NEXT SESSION: find out appt date for neuro consult;  Nu-Step; dual cable rows;  sit to stand from foam pad; static standing with teal power cord isometric trunk extension;  UE movements while standing for ADLs; gait in // bars working on standing erect, longer step lengths;  assess energy levels and endurance ; functional strengthening; no leg press  Glade Pesa, PT 02/09/24 3:20 PM Phone: [phone] Fax: [phone]

## 2024-02-11 ENCOUNTER — Ambulatory Visit: Admitting: Physical Therapy

## 2024-02-11 DIAGNOSIS — R2689 Other abnormalities of gait and mobility: Secondary | ICD-10-CM

## 2024-02-11 DIAGNOSIS — M6281 Muscle weakness (generalized): Secondary | ICD-10-CM

## 2024-02-11 DIAGNOSIS — R262 Difficulty in walking, not elsewhere classified: Secondary | ICD-10-CM

## 2024-02-11 NOTE — Therapy (Signed)
 OUTPATIENT PHYSICAL THERAPY LOWER EXTREMITY TREATMENT  Patient Name: Jeffrey [name] MRN: [id number] DOB:06/10/1956, 68 y.o., male Today's Date: 02/11/2024    END OF SESSION:  PT End of Session - 02/11/24 1453     Visit Number 39    Date for PT Re-Evaluation 03/01/24    Authorization Type HTA    Progress Note Due on Visit 40    PT Start Time 1445    PT Stop Time 1529    PT Time Calculation (min) 44 min    Equipment Utilized During Treatment Gait belt    Activity Tolerance Patient tolerated treatment well                Past Medical History:  Diagnosis Date   Cardiac arrest ([name]) 05/22/2016   Cataract    CHF (congestive heart failure) ([name])    CKD (chronic kidney disease) stage 3, GFR 30-59 ml/min ([name]) 05/28/2021   CKD (chronic kidney disease) stage 4, GFR 15-29 ml/min ([name]) 05/28/2021   Diabetes mellitus without complication ([name])    type 2   Diabetic retinopathy ([name])    Hyperlipidemia    Hypertension    Memory loss    mild   Myocardial infarct ([name]) 2105   Retinopathy    Both eyes   S/P primary angioplasty with coronary stent 05/22/2016   Sleep-disordered breathing 12/31/2023   Vitamin B12 deficiency    Vitreous hemorrhage of left eye ([name])    proliferative diabetic retinopathy   Past Surgical History:  Procedure Laterality Date   ANGIOPLASTY     2015   COLONOSCOPY     multiple   EYE SURGERY     PARS PLANA VITRECTOMY Left 03/15/2020   Procedure: PARS PLANA VITRECTOMY WITH 25 GAUGE;  Surgeon: Jarold Mayo, MD;  Location: [facility] OR;  Service: Ophthalmology;  Laterality: Left;   PHOTOCOAGULATION WITH LASER Left 03/15/2020   Procedure: PHOTOCOAGULATION WITH LASER; INTRAVITREAL INJECTION OF AVASTIN ;  Surgeon: Jarold Mayo, MD;  Location: [facility] OR;  Service: Ophthalmology;  Laterality: Left;   REFRACTIVE SURGERY     UPPER GI ENDOSCOPY     several   VITRECTOMY     Patient Active Problem List   Diagnosis Date Noted   Sleep-disordered breathing 12/31/2023    Actinic keratosis 11/30/2023   Vitamin D  deficiency 11/30/2023   Rhinitis 05/20/2023   Cervical spondylosis 08/26/2022   CKD (chronic kidney disease) stage 4, GFR 15-29 ml/min ([name]) 05/28/2021   Adjustment disorder 01/22/2021   Chronic back pain 10/22/2020   Peripheral vertigo 05/15/2020   Dermatitis 01/04/2020   Unintentional weight loss with loose stools 05/19/2019   Low vitamin B12 level 03/29/2019   Heme positive stool 03/14/2019   Normocytic anemia 03/14/2019   Chronic diastolic heart failure ([name]) 02/08/2019   Diabetic retinopathy ([name]) 09/08/2018   Physical debility 05/24/2018   Varicose veins of both lower extremities 03/01/2018   Fatigue due to exertion 10/14/2017   GERD (gastroesophageal reflux disease) 08/25/2017   CAD in native artery 04/14/2017   Hyperlipidemia associated with type 2 diabetes mellitus ([name]) 04/14/2017   Diabetic peripheral neuropathy associated with type 2 diabetes mellitus ([name]) 08/17/2016   [name] 05/02/2016   Hypertension associated with diabetes ([name]) 05/02/2016    PCP: Kennyth Worth HERO, MD   REFERRING PROVIDER: Kennyth Worth HERO, MD   REFERRING DIAG: R53.81 (ICD-10-CM) - Physical debility   THERAPY DIAG:  Muscle weakness (generalized)  Other abnormalities of gait and mobility  Difficulty in walking, not elsewhere  classified  Rationale for Evaluation and Treatment: Rehabilitation  ONSET DATE: 2022  SUBJECTIVE:   SUBJECTIVE STATEMENT: Did fine after last visit. 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 He has a history of CAD and believes that his debility began  following a covid infection. In December odf 2022 he fell and suffered a compression fracture of L1.  PAIN:  Are you having pain? no  PRECAUTIONS: Fall  RED FLAGS: None   WEIGHT BEARING RESTRICTIONS: No  FALLS:  Has patient fallen in last 6 months? No and with sit to stand falls back sometimes  LIVING ENVIRONMENT: Lives with: lives with their family Lives in: House/apartment Stairs: No Has following equipment at home: Single point cane, Environmental consultant - 2 wheeled, Environmental consultant - 4 wheeled, shower chair, Grab bars, and stand up walker also; also has shower chair at sink for shaving, brushing teeth etc, also has transporter.  OCCUPATION: retired  PLOF: Independent with household mobility with device  PATIENT GOALS: to walk without a walker  NEXT MD VISIT: 09/28/22  OBJECTIVE:  Note: Objective measures were completed at Evaluation unless otherwise noted.  DIAGNOSTIC FINDINGS: N/A  COGNITION: Overall cognitive status: Within functional limits for tasks assessed     SENSATION: Neuropathy in B feet   MUSCLE LENGTH: Marked HS R>L, hip flexor tightness   POSTURE: rounded shoulders and forward head  LOWER EXTREMITY ROM: WFL for tasks assessed  LOWER EXTREMITY MMT: *tested in hooklying  MMT Right eval Left eval 4/1  5/27  Hip flexion 4 4 4  Bil 4 Bil  Hip extension Able to bridge Able to bridge 4- Bil 4- Bil  Hip abduction 4-* 4-* 4- Bil 4- Bil  Hip adduction      Hip internal rotation      Hip external rotation      Knee flexion      Knee extension 4 4+ 4- Bil 4- Bil  Ankle dorsiflexion 4+ 4- 4- Bil 4- Bil  Ankle plantarflexion      Ankle inversion      Ankle eversion       (Blank rows = not tested)  3/20:  max UE assist to rise sit to stand; LE hip extension, knee extension grossly 3+ to 4-/5  FUNCTIONAL TESTS:  30 seconds chair stand test Timed up and go (TUG): 59.36 with 4WW Stance time: 45 sec patient reports unsteadiness and feels he goes  backwards 07/27/2023 92 ft decreased cadence; decreased step length; absent heel strike  12/26: 5x STS: unable to rise without heavy UE use; attempted to use armrests for test but does not come all the way up;  next trial with chair +cushion to touch at least one RW handle 48.2 sec:   TUG: cushion in seat +arm use and RW:  52.49 sec  08/13/23 attempted 5x STS but pt unable to complete: 1st rep does not come all the way up and then plops heavily into the transport wheelchair (he then recounts how this happened at home and how the chair tipped backwards) unable to complete reps Unable to complete TUG today, he does not have his 4 wheeled walker today and feels he is slower with the clinic RW  09/15/2023 TUG: 1:03 with rollator 5STS:48.55 sec heavy reliance on UE support; last repetition was a challenge  3/20: Heavy UE reliance with STS (unable to push up from [facility] armrests, pulls up from // bars) 4/1: max UE assist with sit to stand  5/27:  5x STS no cushion: able to complete 4 reps 47.19 sec            5x STS with cushion 22.5 seat height 5x 43.09 sec able to push up from [facility] armrests rather than // bars (much less reliance on Ues to rise)  GAIT: Distance walked: 25 Assistive device utilized: Walker - 4 wheeled Level of assistance: Modified independence Comments: slow speed, decreased step and stride, decreased heel strike, forward trunk lean.  TRANSFERS: Sit to stand: Uses walker to pull up; stand to sit: does not fully back up to chair and does not reach back; Supine to sit: needs min A for log roll  (has some back pain with sit <>supine due to sitting upward from supine) also fear of falling from bed, sit to supine needs min A to lift legs on to mat table The Patient-Specific Functional Scale  Initial:  I am going to ask you to identify up to 3 important activities that you are unable to do or are having difficulty with as a result of this problem.  Today are there any activities that  you are unable to do or having difficulty with because of this?  (Patient shown scale and patient rated each activity)  Follow up: When you first came in you had difficulty performing these activities.  Today do you still have difficulty?  Patient-Specific activity scoring scheme (Point to one number):  0 1 2 3 4 5 6 7 8 9  10 Unable                                                                                                          Able to perform To perform                                                                                                    activity at the same Activity         Level as before  Injury or problem  Activity            Getting out of the recliner                                                                     Initial:       3                5/27:  4 2.                   Walking with RW 40 feet                                                                Initial:           1              3.                          Standing to brush teeth                                                           Initial:       0 (has to do sitting on shower chair)     5/27: 3            4. Putting on socks and shoes  while sitting                                                      initial 2-3  TODAY'S TREATMENT:  02/11/24: Nu-Step L3 10 min UE/LE Gait belt on for safety: 1st round 10 min, 2nd round 12 min Dynamic warmup: head turns, UE reaching overhead, UE reaching low, LE taps forward and lateral 6 laps in // bars with decreased reliance on Ues but  Beach ball pass 2 min inside // bars (CGA on gait belt but no loss of balance requiring physical assist) Standing in the // bars kicking beach ball (needs bil UE support) Sit to stand from w/c + airex 2 x 5 with UE support Side stepping in // x 2 laps Backwards walk in // bars 2 laps High step walking in  // bars 2 laps Gait in // bars emphasizing longer steps, arm swing Seated dual cable  bil rows  10# on each side  x 10 RPE 5/10  02/09/24: Gait belt on for safety: 1st round 10 min, 2nd round 8 min Dynamic warmup: head turns, UE reaching overhead, UE reaching low, LE taps forward and lateral Beach ball pass 2 min inside // bars (CGA on gait belt but no loss of  balance requiring physical assist) Standing Trunk rotation with the beach ball 8x Seated dual cable  bil rows  10# on each side  x 10 Sit to stand from w/c + airex 2 x 5 with UE support Side stepping in // x 2 laps Backwards walk in // bars 2 laps High step walking in // bars 2 laps Gait in // bars emphasizing longer steps, arm swing Nu-Step L3 5 min UE/LE RPE 4/10 (low)   02/04/24: Nu-Step L5 5 min UE/LE Gait belt on for safety: 1st round 10 min Seated dual cable  bil rows  10# on each side  x 10 Seated biceps curls at cable 10# x 10 Side stepping in // bars bil UE Support needed Sit to stand from w/c + airex 2 x 5 with UE support Side stepping in // x 2 laps Inside // bars: UE reaches single side reaches 5x each     02/02/24: Gait belt on for safety: 1st round 12 min, 2nd round 10 min 5x STS  2 sets with cushion pushing up from W/C armrests and transfer hands to // bars Inside // bars: UE reaches single side reaches 5x each Inside // bars: marching Circle toe taps in rainbow formation and trunk rotation /UE reaching inside // bars 2 rounds of 10 Gait in // bars forward and backward: working on step length and arm swing 2 rounds Gait on stepping stones to increase stride length Side stepping in // bars bil UE Support needed Backwards walk in // bars bil UE support needed Nu-Step L5 5 min UE/LE  01/26/24: PT had to provide min A to assist patient in getting out of the car Gait training in // bars x 6 mins (working on step length & arm swing); side stepping in // bars x1 laps Seated LAQ 2# 10x2 right/left; bil lift  30 sec hold Sated march 2# 10x right/left  Seated biceps curl 10# x 10 bilateral  Seated dual cable  bil rows  10# on each side 10x  NuStep Level 3 2 mins LE only - PT present to discuss status. Patient verbalized not feeling well so stopped PT had to provide min A to assist patient in getting out of the car  PATIENT EDUCATION:  Education details: PT eval findings, anticipated POC, initial HEP, postural awareness, and transfer safety  Person educated: Patient and brother Education method: Explanation, Demonstration, and Handouts Education comprehension: verbalized understanding and returned demonstration  HOME EXERCISE PROGRAM: Access Code: [email or web address] URL: [email or web address] Date: 09/15/2023 Prepared by: Kristeen Sar  Exercises - Sit to Stand with Counter Support  - 3 x daily - 3-4 x weekly - 1 sets - 1-5 reps - Hooklying Clamshell with Resistance  - 2 x daily - 3-4 x weekly - 1 sets - 10 reps - Seated Long Arc Quad  - 1 x daily - 7 x weekly - 1-3 sets - 10 reps - 5 sec hold - Standing Hip Abduction with Counter Support  - 1 x daily - 7 x weekly - 1-2 sets - 10 reps - Standing March with Counter Support  - 1 x daily - 7 x weekly - 1-2 sets - 10 reps - Standing Heel Raise with Support  - 1 x daily - 7 x weekly - 1-2 sets - 10 reps - Shoulder External Rotation and Scapular Retraction with Resistance  - 1 x daily - 7 x weekly - 2 sets - 10 reps - Sit to Stand with Armchair  - 1  x daily - 7 x weekly - 1 sets - 5 reps  Patient Education - Tips to reduce freezing episodes with standing or walking   ASSESSMENT:  CLINICAL IMPRESSION: Good energy level today with 2 rounds of prolonged standing.  Able to perform static standing balance with UE movements without loss of balance.  He does require moderate UE support with all LE movements which is consistent with NPH.  He has an upcoming neuro consult in August.  With verbal cues he is able to lift his feet higher and increase  step length although this diminishes with fatigue.  Transfers with set up assist only and close supervision.    OBJECTIVE IMPAIRMENTS: Abnormal gait, decreased balance, decreased ROM, decreased strength, decreased safety awareness, dizziness, impaired flexibility, impaired sensation, postural dysfunction, and pain.   ACTIVITY LIMITATIONS: carrying, lifting, bending, standing, squatting, stairs, transfers, bed mobility, continence, bathing, and locomotion level  PARTICIPATION LIMITATIONS: shopping and community activity  PERSONAL FACTORS: Age, Fitness, Time since onset of injury/illness/exacerbation, and 3+ comorbidities: Compression fx L1 2022, CAD, CKD (stage 4), neuropathy, retinopathy are also affecting patient's functional outcome.   REHAB POTENTIAL: Good  CLINICAL DECISION MAKING: Unstable/unpredictable  EVALUATION COMPLEXITY: High   GOALS: Goals reviewed with patient? Yes  SHORT TERM GOALS: Target date: 07/30/23 Improved TUG to < 25 sec to decrease fall risk.  Baseline: Goal status: deferred  2.  Able to perform 5XSTS without UE assist and controlled descent showing improved LE strength.  Baseline:  Goal status: deferred 3.  Patient will be able to stand safely for 2 min without UE assist Baseline:  Goal status: met 5/27  LONG TERM GOALS: Target date: 03/01/2024   Ind with advanced HEP Baseline:  Goal status: IN PROGRESS   2.  Sit to stand from the recliner with PSFS score of 5 Baseline:  Goal status: partially met   3.  Gait with RW from the bedroom to the sunroom 40-50 feet with PSFS of 3 Baseline:  Goal status: ongoing  4. Improved LE mobility needed for greater ease with putting on socks and shoes in sitting with PSFS of 5 Baseline:  Goal status: ongoing  5.  Patient will be able to stand long enough to brush teeth PSFS of 2 Baseline:  Goal status: met 5/27  6. Able to rise from 22 inch seat height push from the armrests to use the new chairs in the  living room (in July) New  7. Patient will be able to stand/walk for 13 minutes needed fro grooming and dressing tasks at home   PLAN:  PT FREQUENCY: 2x week  PT DURATION: 8 weeks  PLANNED INTERVENTIONS: 97164- PT Re-evaluation, 97110-Therapeutic exercises, 97530- Therapeutic activity, 97112- Neuromuscular re-education, 97535- Self Care, [id number]- Manual therapy, [phone]- Gait training, [phone]- Aquatic Therapy, 97014- Electrical stimulation (unattended), Patient/Family education, Balance training, Stair training, Taping, Joint mobilization, Spinal mobilization, DME instructions, Cryotherapy, and Moist heat  PLAN FOR NEXT SESSION: 40th visit progress note:  check PSFS ability to rise from the chairs in his living room and ability to stand for grooming at home; mid Aug neuro consult;  Nu-Step; dual cable rows;  sit to stand from foam pad; static standing with teal power cord isometric trunk extension;  UE movements while standing for ADLs; gait in // bars working on standing erect, longer step lengths;  assess energy levels and endurance ; functional strengthening; no leg press  Glade Pesa, PT 02/11/24 3:49 PM Phone: [phone] Fax: [phone]

## 2024-02-16 ENCOUNTER — Ambulatory Visit: Admitting: Physical Therapy

## 2024-02-18 ENCOUNTER — Ambulatory Visit: Admitting: Physical Therapy

## 2024-02-18 ENCOUNTER — Encounter: Payer: Self-pay | Admitting: Physical Therapy

## 2024-02-18 DIAGNOSIS — R2689 Other abnormalities of gait and mobility: Secondary | ICD-10-CM

## 2024-02-18 DIAGNOSIS — R262 Difficulty in walking, not elsewhere classified: Secondary | ICD-10-CM

## 2024-02-18 DIAGNOSIS — M6281 Muscle weakness (generalized): Secondary | ICD-10-CM | POA: Diagnosis not present

## 2024-02-18 NOTE — Therapy (Signed)
 OUTPATIENT PHYSICAL THERAPY LOWER EXTREMITY TREATMENT  Progress Note Reporting Period 12/24/2023 to 02/18/2024  See note below for Objective Data and Assessment of Progress/Goals.     Patient Name: Jeffrey [name] MRN: [id number] DOB:02-14-1956, 68 y.o., male Today's Date: 02/18/2024    END OF SESSION:  PT End of Session - 02/18/24 1539     Visit Number 40    Date for PT Re-Evaluation 03/01/24    Authorization Type HTA    Progress Note Due on Visit 50    PT Start Time 1448    PT Stop Time 1528    PT Time Calculation (min) 40 min    Equipment Utilized During Treatment Gait belt    Activity Tolerance Patient tolerated treatment well    Behavior During Therapy WFL for tasks assessed/performed                 Past Medical History:  Diagnosis Date   Cardiac arrest ([name]) 05/22/2016   Cataract    CHF (congestive heart failure) ([name])    CKD (chronic kidney disease) stage 3, GFR 30-59 ml/min ([name]) 05/28/2021   CKD (chronic kidney disease) stage 4, GFR 15-29 ml/min ([name]) 05/28/2021   Diabetes mellitus without complication ([name])    type 2   Diabetic retinopathy ([name])    Hyperlipidemia    Hypertension    Memory loss    mild   Myocardial infarct ([name]) 2105   Retinopathy    Both eyes   S/P primary angioplasty with coronary stent 05/22/2016   Sleep-disordered breathing 12/31/2023   Vitamin B12 deficiency    Vitreous hemorrhage of left eye ([name])    proliferative diabetic retinopathy   Past Surgical History:  Procedure Laterality Date   ANGIOPLASTY     2015   COLONOSCOPY     multiple   EYE SURGERY     PARS PLANA VITRECTOMY Left 03/15/2020   Procedure: PARS PLANA VITRECTOMY WITH 25 GAUGE;  Surgeon: Jarold Mayo, MD;  Location: [facility] OR;  Service: Ophthalmology;  Laterality: Left;   PHOTOCOAGULATION WITH LASER Left 03/15/2020   Procedure: PHOTOCOAGULATION WITH LASER; INTRAVITREAL INJECTION OF AVASTIN ;  Surgeon: Jarold Mayo, MD;  Location: [facility] OR;  Service: Ophthalmology;   Laterality: Left;   REFRACTIVE SURGERY     UPPER GI ENDOSCOPY     several   VITRECTOMY     Patient Active Problem List   Diagnosis Date Noted   Sleep-disordered breathing 12/31/2023   Actinic keratosis 11/30/2023   Vitamin D  deficiency 11/30/2023   Rhinitis 05/20/2023   Cervical spondylosis 08/26/2022   CKD (chronic kidney disease) stage 4, GFR 15-29 ml/min ([name]) 05/28/2021   Adjustment disorder 01/22/2021   Chronic back pain 10/22/2020   Peripheral vertigo 05/15/2020   Dermatitis 01/04/2020   Unintentional weight loss with loose stools 05/19/2019   Low vitamin B12 level 03/29/2019   Heme positive stool 03/14/2019   Normocytic anemia 03/14/2019   Chronic diastolic heart failure ([name]) 02/08/2019   Diabetic retinopathy ([name]) 09/08/2018   Physical debility 05/24/2018   Varicose veins of both lower extremities 03/01/2018   Fatigue due to exertion 10/14/2017   GERD (gastroesophageal reflux disease) 08/25/2017   CAD in native artery 04/14/2017   Hyperlipidemia associated with type 2 diabetes mellitus ([name]) 04/14/2017   Diabetic peripheral neuropathy associated with type 2 diabetes mellitus ([name]) 08/17/2016   [name] 05/02/2016   Hypertension associated with diabetes ([name]) 05/02/2016    PCP: Kennyth Worth HERO, MD   REFERRING PROVIDER: Kennyth,  Worth HERO, MD   REFERRING DIAG: R18.81 (ICD-10-CM) - Physical debility   THERAPY DIAG:  Muscle weakness (generalized)  Other abnormalities of gait and mobility  Difficulty in walking, not elsewhere classified  Rationale for Evaluation and Treatment: Rehabilitation  ONSET DATE: 2022  SUBJECTIVE:   SUBJECTIVE STATEMENT: Patient reports he is doing good today.    neurologist in mid August   Arrives via transport chair assisted by [name]  Brother: [name] identical twins  PERTINENT HISTORY: 3/11: evidence of lumbar compression deformity Compression fx L1 2022, CAD, CKD (stage 4), DM, cardiac arrest  2017, neuropathy, retinopathy Long history of gait abnormality beginning back in 2022. He has a history of CAD and believes that his debility began following a covid infection. In December odf 2022 he fell and suffered a compression fracture of L1.  PAIN:  Are you having pain? no  PRECAUTIONS: Fall  RED FLAGS: None   WEIGHT BEARING RESTRICTIONS: No  FALLS:  Has patient fallen in last 6 months? No and with sit to stand falls back sometimes  LIVING ENVIRONMENT: Lives with: lives with their family Lives in: House/apartment Stairs: No Has following equipment at home: Single point cane, Environmental consultant - 2 wheeled, Environmental consultant - 4 wheeled, shower chair, Grab bars, and stand up walker also; also has shower chair at sink for shaving, brushing teeth etc, also has transporter.  OCCUPATION: retired  PLOF: Independent with household mobility with device  PATIENT GOALS: to walk without a walker  NEXT MD VISIT: 09/28/22  OBJECTIVE:  Note: Objective measures were completed at Evaluation unless otherwise noted.  DIAGNOSTIC FINDINGS: N/A  COGNITION: Overall cognitive status: Within functional limits for tasks assessed     SENSATION: Neuropathy in B feet   MUSCLE LENGTH: Marked HS R>L, hip flexor tightness   POSTURE: rounded shoulders and forward head  LOWER EXTREMITY ROM: WFL for tasks assessed  LOWER EXTREMITY MMT: *tested in hooklying  MMT Right eval Left eval 4/1  5/27  Hip flexion 4 4 4  Bil 4 Bil  Hip extension Able to bridge Able to bridge 4- Bil 4- Bil  Hip abduction 4-* 4-* 4- Bil 4- Bil  Hip adduction      Hip internal rotation      Hip external rotation      Knee flexion      Knee extension 4 4+ 4- Bil 4- Bil  Ankle dorsiflexion 4+ 4- 4- Bil 4- Bil  Ankle plantarflexion      Ankle inversion      Ankle eversion       (Blank rows = not tested)  3/20:  max UE assist to rise sit to stand; LE hip extension, knee extension grossly 3+ to 4-/5  FUNCTIONAL TESTS:  30 seconds  chair stand test Timed up and go (TUG): 59.36 with 4WW Stance time: 45 sec patient reports unsteadiness and feels he goes backwards 07/27/2023 92 ft decreased cadence; decreased step length; absent heel strike  12/26: 5x STS: unable to rise without heavy UE use; attempted to use armrests for test but does not come all the way up;  next trial with chair +cushion to touch at least one RW handle 48.2 sec:   TUG: cushion in seat +arm use and RW:  52.49 sec  08/13/23 attempted 5x STS but pt unable to complete: 1st rep does not come all the way up and then plops heavily into the transport wheelchair (he then recounts how this happened at home and how the chair tipped backwards)  unable to complete reps Unable to complete TUG today, he does not have his 4 wheeled walker today and feels he is slower with the clinic RW  09/15/2023 TUG: 1:03 with rollator 5STS:48.55 sec heavy reliance on UE support; last repetition was a challenge  3/20: Heavy UE reliance with STS (unable to push up from [facility] armrests, pulls up from // bars) 4/1: max UE assist with sit to stand  5/27:  5x STS no cushion: able to complete 4 reps 47.19 sec            5x STS with cushion 22.5 seat height 5x 43.09 sec able to push up from [facility] armrests rather than // bars (much less reliance on Ues to rise)  GAIT: Distance walked: 25 Assistive device utilized: Walker - 4 wheeled Level of assistance: Modified independence Comments: slow speed, decreased step and stride, decreased heel strike, forward trunk lean.  TRANSFERS: Sit to stand: Uses walker to pull up; stand to sit: does not fully back up to chair and does not reach back; Supine to sit: needs min A for log roll  (has some back pain with sit <>supine due to sitting upward from supine) also fear of falling from bed, sit to supine needs min A to lift legs on to mat table The Patient-Specific Functional Scale  Initial:  I am going to ask you to identify up to 3 important  activities that you are unable to do or are having difficulty with as a result of this problem.  Today are there any activities that you are unable to do or having difficulty with because of this?  (Patient shown scale and patient rated each activity)  Follow up: When you first came in you had difficulty performing these activities.  Today do you still have difficulty?  Patient-Specific activity scoring scheme (Point to one number):  0 1 2 3 4 5 6 7 8 9  10 Unable                                                                                                          Able to perform To perform                                                                                                    activity at the same Activity         Level as before  Injury or problem  Activity            Getting out of the recliner                                                                     Initial:       3                5/27:  4 2.                   Walking with RW 40 feet                                                                Initial:           1              3.                          Standing to brush teeth                                                           Initial:       0 (has to do sitting on shower chair)     5/27: 3            4. Putting on socks and shoes  while sitting                                                      initial 2-3  TODAY'S TREATMENT:  02/18/24: Nu-Step L3 5 min UE/LE Leg Pres (seat 6) 50# 2 x 10 Single arm row at cable column 10# 2 x 10 bilateral  Gait training in // with gait belt x 6 laps 6 inch step taps x 20 Seated shoulder raise 4# DB x 10 bilateral  Hurdles in // bar x 2 laps      02/11/24: Nu-Step L3 10 min UE/LE Gait belt on for safety: 1st round 10 min, 2nd round 12 min Dynamic warmup: head turns, UE reaching overhead, UE  reaching low, LE taps forward and lateral 6 laps in // bars with decreased reliance on Ues but  Beach ball pass 2 min inside // bars (CGA on gait belt but no loss of balance requiring physical assist) Standing in the // bars kicking beach ball (needs bil UE support) Sit to stand from w/c + airex 2 x 5 with UE support Side stepping in // x 2 laps Backwards walk in // bars 2 laps High step walking in // bars 2 laps Gait in // bars emphasizing longer steps, arm  swing Seated dual cable  bil rows  10# on each side  x 10 RPE 5/10  02/09/24: Gait belt on for safety: 1st round 10 min, 2nd round 8 min Dynamic warmup: head turns, UE reaching overhead, UE reaching low, LE taps forward and lateral Beach ball pass 2 min inside // bars (CGA on gait belt but no loss of balance requiring physical assist) Standing Trunk rotation with the beach ball 8x Seated dual cable  bil rows  10# on each side  x 10 Sit to stand from w/c + airex 2 x 5 with UE support Side stepping in // x 2 laps Backwards walk in // bars 2 laps High step walking in // bars 2 laps Gait in // bars emphasizing longer steps, arm swing Nu-Step L3 5 min UE/LE RPE 4/10 (low)   02/04/24: Nu-Step L5 5 min UE/LE Gait belt on for safety: 1st round 10 min Seated dual cable  bil rows  10# on each side  x 10 Seated biceps curls at cable 10# x 10 Side stepping in // bars bil UE Support needed Sit to stand from w/c + airex 2 x 5 with UE support Side stepping in // x 2 laps Inside // bars: UE reaches single side reaches 5x each   PATIENT EDUCATION:  Education details: PT eval findings, anticipated POC, initial HEP, postural awareness, and transfer safety  Person educated: Patient and brother Education method: Explanation, Demonstration, and Handouts Education comprehension: verbalized understanding and returned demonstration  HOME EXERCISE PROGRAM: Access Code: [email or web address] URL: [email or web address] Date:  09/15/2023 Prepared by: Kristeen Sar  Exercises - Sit to Stand with Counter Support  - 3 x daily - 3-4 x weekly - 1 sets - 1-5 reps - Hooklying Clamshell with Resistance  - 2 x daily - 3-4 x weekly - 1 sets - 10 reps - Seated Long Arc Quad  - 1 x daily - 7 x weekly - 1-3 sets - 10 reps - 5 sec hold - Standing Hip Abduction with Counter Support  - 1 x daily - 7 x weekly - 1-2 sets - 10 reps - Standing March with Counter Support  - 1 x daily - 7 x weekly - 1-2 sets - 10 reps - Standing Heel Raise with Support  - 1 x daily - 7 x weekly - 1-2 sets - 10 reps - Shoulder External Rotation and Scapular Retraction with Resistance  - 1 x daily - 7 x weekly - 2 sets - 10 reps - Sit to Stand with Armchair  - 1 x daily - 7 x weekly - 1 sets - 5 reps  Patient Education - Tips to reduce freezing episodes with standing or walking   ASSESSMENT:  CLINICAL IMPRESSION: Incorporated more single leg balance activities today. Patient tolerated exercises well and verbalized feeling a challenge. Hurdles were a good exercise to emphasize improved toe clearance with ambulation. Patient required reminders for arm swing and upright posture when ambulating. He has an appointment scheduled with a neurologist to discuss options for the water on his brain but he could not remember when his appointment is. Patient will benefit from skilled PT to address the below impairments and improve overall function.    OBJECTIVE IMPAIRMENTS: Abnormal gait, decreased balance, decreased ROM, decreased strength, decreased safety awareness, dizziness, impaired flexibility, impaired sensation, postural dysfunction, and pain.   ACTIVITY LIMITATIONS: carrying, lifting, bending, standing, squatting, stairs, transfers, bed mobility, continence, bathing, and locomotion level  PARTICIPATION LIMITATIONS: shopping and community activity  PERSONAL FACTORS: Age, Fitness, Time since onset of injury/illness/exacerbation, and 3+ comorbidities:  Compression fx L1 2022, CAD, CKD (stage 4), neuropathy, retinopathy are also affecting patient's functional outcome.   REHAB POTENTIAL: Good  CLINICAL DECISION MAKING: Unstable/unpredictable  EVALUATION COMPLEXITY: High   GOALS: Goals reviewed with patient? Yes  SHORT TERM GOALS: Target date: 07/30/23 Improved TUG to < 25 sec to decrease fall risk.  Baseline: Goal status: deferred  2.  Able to perform 5XSTS without UE assist and controlled descent showing improved LE strength.  Baseline:  Goal status: deferred 3.  Patient will be able to stand safely for 2 min without UE assist Baseline:  Goal status: met 5/27  LONG TERM GOALS: Target date: 03/01/2024   Ind with advanced HEP Baseline:  Goal status: IN PROGRESS   2.  Sit to stand from the recliner with PSFS score of 5 Baseline:  Goal status: partially met   3.  Gait with RW from the bedroom to the sunroom 40-50 feet with PSFS of 3 Baseline:  Goal status: ongoing 02/18/2024  4. Improved LE mobility needed for greater ease with putting on socks and shoes in sitting with PSFS of 5 Baseline:  Goal status: ongoing 02/18/2024  5.  Patient will be able to stand long enough to brush teeth PSFS of 2 Baseline:  Goal status: met 5/27  6. Able to rise from 22 inch seat height push from the armrests to use the new chairs in the living room (in July) Goal status: ongoing 02/18/2024  7. Patient will be able to stand/walk for 13 minutes needed fro grooming and dressing tasks at home    Goal status: ongoing 02/18/2024  PLAN:  PT FREQUENCY: 2x week  PT DURATION: 8 weeks  PLANNED INTERVENTIONS: 97164- PT Re-evaluation, 97110-Therapeutic exercises, 97530- Therapeutic activity, 97112- Neuromuscular re-education, 97535- Self Care, [id number]- Manual therapy, [phone]- Gait training, [phone]- Aquatic Therapy, [phone]- Electrical stimulation (unattended), Patient/Family education, Balance training, Stair training, Taping, Joint mobilization, Spinal  mobilization, DME instructions, Cryotherapy, and Moist heat  PLAN FOR NEXT SESSION: assess response to treatment session (did leg press) mid Aug neuro consult;  Nu-Step; dual cable rows;  sit to stand from foam pad; static standing with teal power cord isometric trunk extension;  UE movements while standing for ADLs; gait in // bars working on standing erect, longer step lengths;  assess energy levels and endurance ; functional strengthening; no leg press  Kristeen Sar, PT 02/18/24 3:41 PM

## 2024-02-22 DIAGNOSIS — N184 Chronic kidney disease, stage 4 (severe): Secondary | ICD-10-CM | POA: Diagnosis not present

## 2024-02-23 ENCOUNTER — Ambulatory Visit: Admitting: Physical Therapy

## 2024-02-23 DIAGNOSIS — R2689 Other abnormalities of gait and mobility: Secondary | ICD-10-CM

## 2024-02-23 DIAGNOSIS — R262 Difficulty in walking, not elsewhere classified: Secondary | ICD-10-CM

## 2024-02-23 DIAGNOSIS — M6281 Muscle weakness (generalized): Secondary | ICD-10-CM | POA: Diagnosis not present

## 2024-02-23 NOTE — Therapy (Signed)
 OUTPATIENT PHYSICAL THERAPY LOWER EXTREMITY TREATMENT    Patient Name: Jeffrey [name] MRN: [id number] DOB:29-Jun-1956, 68 y.o., male Today's Date: 02/23/2024    END OF SESSION:  PT End of Session - 02/23/24 1447     Visit Number 41    Date for PT Re-Evaluation 03/01/24    Authorization Type HTA    Progress Note Due on Visit 50    PT Start Time 1446    PT Stop Time 1525    PT Time Calculation (min) 39 min    Equipment Utilized During Treatment Gait belt    Activity Tolerance Patient tolerated treatment well                 Past Medical History:  Diagnosis Date   Cardiac arrest ([name]) 05/22/2016   Cataract    CHF (congestive heart failure) ([name])    CKD (chronic kidney disease) stage 3, GFR 30-59 ml/min ([name]) 05/28/2021   CKD (chronic kidney disease) stage 4, GFR 15-29 ml/min ([name]) 05/28/2021   Diabetes mellitus without complication ([name])    type 2   Diabetic retinopathy ([name])    Hyperlipidemia    Hypertension    Memory loss    mild   Myocardial infarct ([name]) 2105   Retinopathy    Both eyes   S/P primary angioplasty with coronary stent 05/22/2016   Sleep-disordered breathing 12/31/2023   Vitamin B12 deficiency    Vitreous hemorrhage of left eye ([name])    proliferative diabetic retinopathy   Past Surgical History:  Procedure Laterality Date   ANGIOPLASTY     2015   COLONOSCOPY     multiple   EYE SURGERY     PARS PLANA VITRECTOMY Left 03/15/2020   Procedure: PARS PLANA VITRECTOMY WITH 25 GAUGE;  Surgeon: Jarold Mayo, MD;  Location: [facility] OR;  Service: Ophthalmology;  Laterality: Left;   PHOTOCOAGULATION WITH LASER Left 03/15/2020   Procedure: PHOTOCOAGULATION WITH LASER; INTRAVITREAL INJECTION OF AVASTIN ;  Surgeon: Jarold Mayo, MD;  Location: [facility] OR;  Service: Ophthalmology;  Laterality: Left;   REFRACTIVE SURGERY     UPPER GI ENDOSCOPY     several   VITRECTOMY     Patient Active Problem List   Diagnosis Date Noted   Sleep-disordered breathing 12/31/2023    Actinic keratosis 11/30/2023   Vitamin D  deficiency 11/30/2023   Rhinitis 05/20/2023   Cervical spondylosis 08/26/2022   CKD (chronic kidney disease) stage 4, GFR 15-29 ml/min ([name]) 05/28/2021   Adjustment disorder 01/22/2021   Chronic back pain 10/22/2020   Peripheral vertigo 05/15/2020   Dermatitis 01/04/2020   Unintentional weight loss with loose stools 05/19/2019   Low vitamin B12 level 03/29/2019   Heme positive stool 03/14/2019   Normocytic anemia 03/14/2019   Chronic diastolic heart failure ([name]) 02/08/2019   Diabetic retinopathy ([name]) 09/08/2018   Physical debility 05/24/2018   Varicose veins of both lower extremities 03/01/2018   Fatigue due to exertion 10/14/2017   GERD (gastroesophageal reflux disease) 08/25/2017   CAD in native artery 04/14/2017   Hyperlipidemia associated with type 2 diabetes mellitus ([name]) 04/14/2017   Diabetic peripheral neuropathy associated with type 2 diabetes mellitus ([name]) 08/17/2016   [name] 05/02/2016   Hypertension associated with diabetes ([name]) 05/02/2016    PCP: Kennyth Worth HERO, MD   REFERRING PROVIDER: Kennyth Worth HERO, MD   REFERRING DIAG: R53.81 (ICD-10-CM) - Physical debility   THERAPY DIAG:  Muscle weakness (generalized)  Other abnormalities of gait and mobility  Difficulty in  walking, not elsewhere classified  Rationale for Evaluation and Treatment: Rehabilitation  ONSET DATE: 2022  SUBJECTIVE:   SUBJECTIVE STATEMENT: Pt prefers to work on the walking in the // bars.      neurologist  August 28nd   Arrives via transport chair assisted by Marko  Brother: [name] identical twins  PERTINENT HISTORY: 3/11: evidence of lumbar compression deformity Compression fx L1 2022, CAD, CKD (stage 4), DM, cardiac arrest 2017, neuropathy, retinopathy Long history of gait abnormality beginning back in 2022. He has a history of CAD and believes that his debility began following a covid infection.  In December odf 2022 he fell and suffered a compression fracture of L1.  PAIN:  Are you having pain? no  PRECAUTIONS: Fall  RED FLAGS: None   WEIGHT BEARING RESTRICTIONS: No  FALLS:  Has patient fallen in last 6 months? No and with sit to stand falls back sometimes  LIVING ENVIRONMENT: Lives with: lives with their family Lives in: House/apartment Stairs: No Has following equipment at home: Single point cane, Environmental consultant - 2 wheeled, Environmental consultant - 4 wheeled, shower chair, Grab bars, and stand up walker also; also has shower chair at sink for shaving, brushing teeth etc, also has transporter.  OCCUPATION: retired  PLOF: Independent with household mobility with device  PATIENT GOALS: to walk without a walker  NEXT MD VISIT: 09/28/22  OBJECTIVE:  Note: Objective measures were completed at Evaluation unless otherwise noted.  DIAGNOSTIC FINDINGS: N/A  COGNITION: Overall cognitive status: Within functional limits for tasks assessed     SENSATION: Neuropathy in B feet   MUSCLE LENGTH: Marked HS R>L, hip flexor tightness   POSTURE: rounded shoulders and forward head  LOWER EXTREMITY ROM: WFL for tasks assessed  LOWER EXTREMITY MMT: *tested in hooklying  MMT Right eval Left eval 4/1  5/27  Hip flexion 4 4 4  Bil 4 Bil  Hip extension Able to bridge Able to bridge 4- Bil 4- Bil  Hip abduction 4-* 4-* 4- Bil 4- Bil  Hip adduction      Hip internal rotation      Hip external rotation      Knee flexion      Knee extension 4 4+ 4- Bil 4- Bil  Ankle dorsiflexion 4+ 4- 4- Bil 4- Bil  Ankle plantarflexion      Ankle inversion      Ankle eversion       (Blank rows = not tested)  3/20:  max UE assist to rise sit to stand; LE hip extension, knee extension grossly 3+ to 4-/5  FUNCTIONAL TESTS:  30 seconds chair stand test Timed up and go (TUG): 59.36 with 4WW Stance time: 45 sec patient reports unsteadiness and feels he goes backwards 07/27/2023 92 ft decreased cadence;  decreased step length; absent heel strike  12/26: 5x STS: unable to rise without heavy UE use; attempted to use armrests for test but does not come all the way up;  next trial with chair +cushion to touch at least one RW handle 48.2 sec:   TUG: cushion in seat +arm use and RW:  52.49 sec  08/13/23 attempted 5x STS but pt unable to complete: 1st rep does not come all the way up and then plops heavily into the transport wheelchair (he then recounts how this happened at home and how the chair tipped backwards) unable to complete reps Unable to complete TUG today, he does not have his 4 wheeled walker today and feels he is slower with  the clinic RW  09/15/2023 TUG: 1:03 with rollator 5STS:48.55 sec heavy reliance on UE support; last repetition was a challenge  3/20: Heavy UE reliance with STS (unable to push up from [facility] armrests, pulls up from // bars) 4/1: max UE assist with sit to stand  5/27:  5x STS no cushion: able to complete 4 reps 47.19 sec            5x STS with cushion 22.5 seat height 5x 43.09 sec able to push up from [facility] armrests rather than // bars (much less reliance on Ues to rise)  GAIT: Distance walked: 25 Assistive device utilized: Walker - 4 wheeled Level of assistance: Modified independence Comments: slow speed, decreased step and stride, decreased heel strike, forward trunk lean.  TRANSFERS: Sit to stand: Uses walker to pull up; stand to sit: does not fully back up to chair and does not reach back; Supine to sit: needs min A for log roll  (has some back pain with sit <>supine due to sitting upward from supine) also fear of falling from bed, sit to supine needs min A to lift legs on to mat table The Patient-Specific Functional Scale  Initial:  I am going to ask you to identify up to 3 important activities that you are unable to do or are having difficulty with as a result of this problem.  Today are there any activities that you are unable to do or having difficulty with  because of this?  (Patient shown scale and patient rated each activity)  Follow up: When you first came in you had difficulty performing these activities.  Today do you still have difficulty?  Patient-Specific activity scoring scheme (Point to one number):  0 1 2 3 4 5 6 7 8 9  10 Unable                                                                                                          Able to perform To perform                                                                                                    activity at the same Activity         Level as before  Injury or problem  Activity            Getting out of the recliner                                                                     Initial:       3                5/27:  4 2.                   Walking with RW 40 feet                                                                Initial:           1              3.                          Standing to brush teeth                                                           Initial:       0 (has to do sitting on shower chair)     5/27: 3            4. Putting on socks and shoes  while sitting                                                      initial 2-3  TODAY'S TREATMENT:  02/23/24: Nu-Step L3 8 min UE/LE Bil arm row at cable column 10# 20 x UE reaching in multi directions  Standing in // bars with hip flexion to blue band at knee level (external cue) 5x 2 sets (2nd set with single arm support) Backwards walking in // bars 2 laps Stepping up and over blue band at ankle level 5x right/left Gait training in // with gait belt x 6 laps Seated blue band clams 12x  02/18/24: Nu-Step L3 5 min UE/LE Leg Pres (seat 6) 50# 2 x 10 Single arm row at cable column 10# 2 x 10 bilateral  Gait training in // with gait belt x 6 laps 6 inch step taps x 20 Seated  shoulder raise 4# DB x 10 bilateral  Hurdles in // bar x 2 laps      02/11/24: Nu-Step L3 10 min UE/LE Gait belt on for safety: 1st round 10 min, 2nd round 12 min Dynamic warmup: head turns, UE reaching overhead, UE reaching low, LE taps forward and lateral 6 laps in // bars with decreased reliance  on Ues but  Beach ball pass 2 min inside // bars (CGA on gait belt but no loss of balance requiring physical assist) Standing in the // bars kicking beach ball (needs bil UE support) Sit to stand from w/c + airex 2 x 5 with UE support Side stepping in // x 2 laps Backwards walk in // bars 2 laps High step walking in // bars 2 laps Gait in // bars emphasizing longer steps, arm swing Seated dual cable  bil rows  10# on each side  x 10 RPE 5/10  02/09/24: Gait belt on for safety: 1st round 10 min, 2nd round 8 min Dynamic warmup: head turns, UE reaching overhead, UE reaching low, LE taps forward and lateral Beach ball pass 2 min inside // bars (CGA on gait belt but no loss of balance requiring physical assist) Standing Trunk rotation with the beach ball 8x Seated dual cable  bil rows  10# on each side  x 10 Sit to stand from w/c + airex 2 x 5 with UE support Side stepping in // x 2 laps Backwards walk in // bars 2 laps High step walking in // bars 2 laps Gait in // bars emphasizing longer steps, arm swing Nu-Step L3 5 min UE/LE RPE 4/10 (low)   02/04/24: Nu-Step L5 5 min UE/LE Gait belt on for safety: 1st round 10 min Seated dual cable  bil rows  10# on each side  x 10 Seated biceps curls at cable 10# x 10 Side stepping in // bars bil UE Support needed Sit to stand from w/c + airex 2 x 5 with UE support Side stepping in // x 2 laps Inside // bars: UE reaches single side reaches 5x each   PATIENT EDUCATION:  Education details: PT eval findings, anticipated POC, initial HEP, postural awareness, and transfer safety  Person educated: Patient and brother Education method:  Explanation, Demonstration, and Handouts Education comprehension: verbalized understanding and returned demonstration  HOME EXERCISE PROGRAM: Access Code: [email or web address] URL: [email or web address] Date: 09/15/2023 Prepared by: Kristeen Sar  Exercises - Sit to Stand with Counter Support  - 3 x daily - 3-4 x weekly - 1 sets - 1-5 reps - Hooklying Clamshell with Resistance  - 2 x daily - 3-4 x weekly - 1 sets - 10 reps - Seated Long Arc Quad  - 1 x daily - 7 x weekly - 1-3 sets - 10 reps - 5 sec hold - Standing Hip Abduction with Counter Support  - 1 x daily - 7 x weekly - 1-2 sets - 10 reps - Standing March with Counter Support  - 1 x daily - 7 x weekly - 1-2 sets - 10 reps - Standing Heel Raise with Support  - 1 x daily - 7 x weekly - 1-2 sets - 10 reps - Shoulder External Rotation and Scapular Retraction with Resistance  - 1 x daily - 7 x weekly - 2 sets - 10 reps - Sit to Stand with Armchair  - 1 x daily - 7 x weekly - 1 sets - 5 reps  Patient Education - Tips to reduce freezing episodes with standing or walking   ASSESSMENT:  CLINICAL IMPRESSION: Patient completed multi-planar reaching in standing to improve coordination and functional item retrieval for safe meal prep and grooming/dressing tasks.  He states he has been sitting for shaving, brushing his teeth and combing his hair since his brother had surgery for fear of falling and no one to help him up.  The band was a good external cue for foot clearance and step height.  He reports moderate fatigue at the end of session.        OBJECTIVE IMPAIRMENTS: Abnormal gait, decreased balance, decreased ROM, decreased strength, decreased safety awareness, dizziness, impaired flexibility, impaired sensation, postural dysfunction, and pain.   ACTIVITY LIMITATIONS: carrying, lifting, bending, standing, squatting, stairs, transfers, bed mobility, continence, bathing, and locomotion level  PARTICIPATION LIMITATIONS: shopping and  community activity  PERSONAL FACTORS: Age, Fitness, Time since onset of injury/illness/exacerbation, and 3+ comorbidities: Compression fx L1 2022, CAD, CKD (stage 4), neuropathy, retinopathy are also affecting patient's functional outcome.   REHAB POTENTIAL: Good  CLINICAL DECISION MAKING: Unstable/unpredictable  EVALUATION COMPLEXITY: High   GOALS: Goals reviewed with patient? Yes  SHORT TERM GOALS: Target date: 07/30/23 Improved TUG to < 25 sec to decrease fall risk.  Baseline: Goal status: deferred  2.  Able to perform 5XSTS without UE assist and controlled descent showing improved LE strength.  Baseline:  Goal status: deferred 3.  Patient will be able to stand safely for 2 min without UE assist Baseline:  Goal status: met 5/27  LONG TERM GOALS: Target date: 03/01/2024   Ind with advanced HEP Baseline:  Goal status: IN PROGRESS   2.  Sit to stand from the recliner with PSFS score of 5 Baseline:  Goal status: partially met   3.  Gait with RW from the bedroom to the sunroom 40-50 feet with PSFS of 3 Baseline:  Goal status: ongoing 02/18/2024  4. Improved LE mobility needed for greater ease with putting on socks and shoes in sitting with PSFS of 5 Baseline:  Goal status: ongoing 02/18/2024  5.  Patient will be able to stand long enough to brush teeth PSFS of 2 Baseline:  Goal status: met 5/27  6. Able to rise from 22 inch seat height push from the armrests to use the new chairs in the living room (in July) Goal status: ongoing 02/18/2024  7. Patient will be able to stand/walk for 13 minutes needed fro grooming and dressing tasks at home    Goal status: ongoing 02/18/2024  PLAN:  PT FREQUENCY: 2x week  PT DURATION: 8 weeks  PLANNED INTERVENTIONS: 97164- PT Re-evaluation, 97110-Therapeutic exercises, 97530- Therapeutic activity, 97112- Neuromuscular re-education, 97535- Self Care, [id number]- Manual therapy, [phone]- Gait training, [phone]- Aquatic Therapy, [phone]-  Electrical stimulation (unattended), Patient/Family education, Balance training, Stair training, Taping, Joint mobilization, Spinal mobilization, DME instructions, Cryotherapy, and Moist heat  PLAN FOR NEXT SESSION: end of Aug neuro consult;  Nu-Step; dual cable rows;  sit to stand from foam pad; static standing with teal power cord isometric trunk extension;  UE movements while standing for ADLs; gait in // bars working on standing erect, longer step lengths;  assess energy levels and endurance ; functional strengthening  Glade Pesa, PT 02/23/24 6:24 PM Phone: [phone] Fax: [phone]

## 2024-02-24 ENCOUNTER — Other Ambulatory Visit: Payer: Self-pay | Admitting: Family Medicine

## 2024-02-25 ENCOUNTER — Ambulatory Visit: Admitting: Physical Therapy

## 2024-03-01 ENCOUNTER — Ambulatory Visit: Admitting: Physical Therapy

## 2024-03-01 ENCOUNTER — Ambulatory Visit: Admitting: Family Medicine

## 2024-03-03 ENCOUNTER — Ambulatory Visit: Admitting: Physical Therapy

## 2024-03-03 DIAGNOSIS — M6281 Muscle weakness (generalized): Secondary | ICD-10-CM | POA: Diagnosis not present

## 2024-03-03 DIAGNOSIS — N184 Chronic kidney disease, stage 4 (severe): Secondary | ICD-10-CM | POA: Diagnosis not present

## 2024-03-03 DIAGNOSIS — D631 Anemia in chronic kidney disease: Secondary | ICD-10-CM | POA: Diagnosis not present

## 2024-03-03 DIAGNOSIS — E559 Vitamin D deficiency, unspecified: Secondary | ICD-10-CM | POA: Diagnosis not present

## 2024-03-03 DIAGNOSIS — E1122 Type 2 diabetes mellitus with diabetic chronic kidney disease: Secondary | ICD-10-CM | POA: Diagnosis not present

## 2024-03-03 DIAGNOSIS — R2689 Other abnormalities of gait and mobility: Secondary | ICD-10-CM

## 2024-03-03 DIAGNOSIS — E875 Hyperkalemia: Secondary | ICD-10-CM | POA: Diagnosis not present

## 2024-03-03 DIAGNOSIS — R262 Difficulty in walking, not elsewhere classified: Secondary | ICD-10-CM

## 2024-03-03 DIAGNOSIS — I129 Hypertensive chronic kidney disease with stage 1 through stage 4 chronic kidney disease, or unspecified chronic kidney disease: Secondary | ICD-10-CM | POA: Diagnosis not present

## 2024-03-03 DIAGNOSIS — I5030 Unspecified diastolic (congestive) heart failure: Secondary | ICD-10-CM | POA: Diagnosis not present

## 2024-03-03 NOTE — Therapy (Signed)
 OUTPATIENT PHYSICAL THERAPY LOWER EXTREMITY TREATMENT/RECERTIFICATION    Patient Name: Jeffrey [name] MRN: [id number] DOB:1955-09-05, 68 y.o., male Today's Date: 03/03/2024    END OF SESSION:  PT End of Session - 03/03/24 1404     Visit Number 42    Date for PT Re-Evaluation 04/28/24    Authorization Type HTA    Progress Note Due on Visit 50    PT Start Time 1401    PT Stop Time 1444    PT Time Calculation (min) 43 min    Equipment Utilized During Treatment Gait belt    Activity Tolerance Patient tolerated treatment well                 Past Medical History:  Diagnosis Date   Cardiac arrest ([name]) 05/22/2016   Cataract    CHF (congestive heart failure) ([name])    CKD (chronic kidney disease) stage 3, GFR 30-59 ml/min ([name]) 05/28/2021   CKD (chronic kidney disease) stage 4, GFR 15-29 ml/min ([name]) 05/28/2021   Diabetes mellitus without complication ([name])    type 2   Diabetic retinopathy ([name])    Hyperlipidemia    Hypertension    Memory loss    mild   Myocardial infarct ([name]) 2105   Retinopathy    Both eyes   S/P primary angioplasty with coronary stent 05/22/2016   Sleep-disordered breathing 12/31/2023   Vitamin B12 deficiency    Vitreous hemorrhage of left eye ([name])    proliferative diabetic retinopathy   Past Surgical History:  Procedure Laterality Date   ANGIOPLASTY     2015   COLONOSCOPY     multiple   EYE SURGERY     PARS PLANA VITRECTOMY Left 03/15/2020   Procedure: PARS PLANA VITRECTOMY WITH 25 GAUGE;  Surgeon: Jeffrey Mayo, MD;  Location: [facility] OR;  Service: Ophthalmology;  Laterality: Left;   PHOTOCOAGULATION WITH LASER Left 03/15/2020   Procedure: PHOTOCOAGULATION WITH LASER; INTRAVITREAL INJECTION OF AVASTIN ;  Surgeon: Jeffrey Mayo, MD;  Location: [facility] OR;  Service: Ophthalmology;  Laterality: Left;   REFRACTIVE SURGERY     UPPER GI ENDOSCOPY     several   VITRECTOMY     Patient Active Problem List   Diagnosis Date Noted   Sleep-disordered  breathing 12/31/2023   Actinic keratosis 11/30/2023   Vitamin D  deficiency 11/30/2023   Rhinitis 05/20/2023   Cervical spondylosis 08/26/2022   CKD (chronic kidney disease) stage 4, GFR 15-29 ml/min ([name]) 05/28/2021   Adjustment disorder 01/22/2021   Chronic back pain 10/22/2020   Peripheral vertigo 05/15/2020   Dermatitis 01/04/2020   Unintentional weight loss with loose stools 05/19/2019   Low vitamin B12 level 03/29/2019   Heme positive stool 03/14/2019   Normocytic anemia 03/14/2019   Chronic diastolic heart failure ([name]) 02/08/2019   Diabetic retinopathy ([name]) 09/08/2018   Physical debility 05/24/2018   Varicose veins of both lower extremities 03/01/2018   Fatigue due to exertion 10/14/2017   GERD (gastroesophageal reflux disease) 08/25/2017   CAD in native artery 04/14/2017   Hyperlipidemia associated with type 2 diabetes mellitus ([name]) 04/14/2017   Diabetic peripheral neuropathy associated with type 2 diabetes mellitus ([name]) 08/17/2016   [name] 05/02/2016   Hypertension associated with diabetes ([name]) 05/02/2016    PCP: Jeffrey Worth HERO, MD   REFERRING PROVIDER: Kennyth Worth HERO, MD   REFERRING DIAG: R53.81 (ICD-10-CM) - Physical debility   THERAPY DIAG:  Muscle weakness (generalized)  Other abnormalities of gait and mobility  Difficulty in  walking, not elsewhere classified  Rationale for Evaluation and Treatment: Rehabilitation  ONSET DATE: 2022  SUBJECTIVE:   SUBJECTIVE STATEMENT: Pt has missed appts secondary 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  States he's fatigued and feels like he's starting all over.  Had an appt this morning with the kidney doctor and he told me to stop looking things up on the computer about my kidneys and brain.      neurologist  August 28nd regarding NPH   Arrives via transport chair assisted by Jeffrey [name]  Brother: Jeffrey [name] identical twins  PERTINENT HISTORY: 3/11: evidence of lumbar compression  deformity Compression fx L1 2022, CAD, CKD (stage 4), DM, cardiac arrest 2017, neuropathy, retinopathy Long history of gait abnormality beginning back in 2022. He has a history of CAD and believes that his debility began following a covid infection. In December odf 2022 he fell and suffered a compression fracture of L1.  PAIN:  Are you having pain? no  PRECAUTIONS: Fall  RED FLAGS: None   WEIGHT BEARING RESTRICTIONS: No  FALLS:  Has patient fallen in last 6 months? No and with sit to stand falls back sometimes  LIVING ENVIRONMENT: Lives with: lives with their family Lives in: House/apartment Stairs: No Has following equipment at home: Single point cane, Environmental consultant - 2 wheeled, Environmental consultant - 4 wheeled, shower chair, Grab bars, and stand up walker also; also has shower chair at sink for shaving, brushing teeth etc, also has transporter.  OCCUPATION: retired  PLOF: Independent with household mobility with device  PATIENT GOALS: to walk without a walker  NEXT MD VISIT: 09/28/22  OBJECTIVE:  Note: Objective measures were completed at Evaluation unless otherwise noted.  DIAGNOSTIC FINDINGS: N/A  COGNITION: Overall cognitive status: Within functional limits for tasks assessed     SENSATION: Neuropathy in B feet   MUSCLE LENGTH: Marked HS R>L, hip flexor tightness   POSTURE: rounded shoulders and forward head  LOWER EXTREMITY ROM: WFL for tasks assessed  LOWER EXTREMITY MMT: *tested in hooklying  MMT Right eval Left eval 4/1  5/27  Hip flexion 4 4 4  Bil 4 Bil  Hip extension Able to bridge Able to bridge 4- Bil 4- Bil  Hip abduction 4-* 4-* 4- Bil 4- Bil  Hip adduction      Hip internal rotation      Hip external rotation      Knee flexion      Knee extension 4 4+ 4- Bil 4- Bil  Ankle dorsiflexion 4+ 4- 4- Bil 4- Bil  Ankle plantarflexion      Ankle inversion      Ankle eversion       (Blank rows = not tested)  3/20:  max UE assist to rise sit to stand; LE hip  extension, knee extension grossly 3+ to 4-/5  FUNCTIONAL TESTS:  30 seconds chair stand test Timed up and go (TUG): 59.36 with 4WW Stance time: 45 sec patient reports unsteadiness and feels he goes backwards 07/27/2023 92 ft decreased cadence; decreased step length; absent heel strike  12/26: 5x STS: unable to rise without heavy UE use; attempted to use armrests for test but does not come all the way up;  next trial with chair +cushion to touch at least one RW handle 48.2 sec:   TUG: cushion in seat +arm use and RW:  52.49 sec  08/13/23 attempted 5x STS but pt unable to complete: 1st rep does not come all the way up and then plops heavily into  the transport wheelchair (he then recounts how this happened at home and how the chair tipped backwards) unable to complete reps Unable to complete TUG today, he does not have his 4 wheeled walker today and feels he is slower with the clinic RW  09/15/2023 TUG: 1:03 with rollator 5STS:48.55 sec heavy reliance on UE support; last repetition was a challenge  3/20: Heavy UE reliance with STS (unable to push up from [facility] armrests, pulls up from // bars) 4/1: max UE assist with sit to stand  5/27:  5x STS no cushion: able to complete 4 reps 47.19 sec            5x STS with cushion 22.5 seat height 5x 43.09 sec able to push up from [facility] armrests rather than // bars (much less reliance on Ues to rise)  7/24: 5x STS with cushion 53.30 needs UE assist from [facility], 2nd trial 32.22 push from [facility] armrests  GAIT: Distance walked: 25 Assistive device utilized: Walker - 4 wheeled Level of assistance: Modified independence Comments: slow speed, decreased step and stride, decreased heel strike, forward trunk lean.  TRANSFERS: Sit to stand: Uses walker to pull up; stand to sit: does not fully back up to chair and does not reach back; Supine to sit: needs min A for log roll  (has some back pain with sit <>supine due to sitting upward from supine) also fear of falling  from bed, sit to supine needs min A to lift legs on to mat table The Patient-Specific Functional Scale  Initial:  I am going to ask you to identify up to 3 important activities that you are unable to do or are having difficulty with as a result of this problem.  Today are there any activities that you are unable to do or having difficulty with because of this?  (Patient shown scale and patient rated each activity)  Follow up: When you first came in you had difficulty performing these activities.  Today do you still have difficulty?  Patient-Specific activity scoring scheme (Point to one number):  0 1 2 3 4 5 6 7 8 9  10 Unable                                                                                                          Able to perform To perform                                                                                                    activity at the same Activity         Level as before  Injury or problem  Activity            Getting out of the recliner                                                                     Initial:       3                5/27:  4    7/24: 4-5 2.                   Walking with RW 40 feet                                                                Initial:           1                             7/24: 6-7 3.                          Standing to brush teeth                                                           Initial:       0 (has to do sitting on shower chair)     5/27: 3       7/24: 4-5 4. Putting on socks and shoes  while sitting                                                      initial 2-3              7/24:   5  TODAY'S TREATMENT:  03/03/24: 5x STS from foam cushion in W/C  2 trials PSFS Backwards walking in // bars 2 laps Sidestepping in // bars 4 laps Gait training in // with gait belt x 6 laps with emphasis on  longer step length and toe clearance, arm swing Seated rocker board UE reaching in multi directions  Nu-Step L3 4 min UE/LE 02/23/24: Nu-Step L3 8 min UE/LE Bil arm row at cable column 10# 20 x UE reaching in multi directions  Standing in // bars with hip flexion to blue band at knee level (external cue) 5x 2 sets (2nd set with single arm support) Backwards walking in // bars 2 laps Stepping up and over blue band at ankle level 5x right/left Gait training in // with gait belt x 6 laps Seated blue band clams 12x  02/18/24: Nu-Step L3 5 min UE/LE Leg Pres (seat  6) 50# 2 x 10 Single arm row at cable column 10# 2 x 10 bilateral  Gait training in // with gait belt x 6 laps 6 inch step taps x 20 Seated shoulder raise 4# DB x 10 bilateral  Hurdles in // bar x 2 laps      02/11/24: Nu-Step L3 10 min UE/LE Gait belt on for safety: 1st round 10 min, 2nd round 12 min Dynamic warmup: head turns, UE reaching overhead, UE reaching low, LE taps forward and lateral 6 laps in // bars with decreased reliance on Ues but  Beach ball pass 2 min inside // bars (CGA on gait belt but no loss of balance requiring physical assist) Standing in the // bars kicking beach ball (needs bil UE support) Sit to stand from w/c + airex 2 x 5 with UE support Side stepping in // x 2 laps Backwards walk in // bars 2 laps High step walking in // bars 2 laps Gait in // bars emphasizing longer steps, arm swing Seated dual cable  bil rows  10# on each side  x 10 RPE 5/10  PATIENT EDUCATION:  Education details: PT eval findings, anticipated POC, initial HEP, postural awareness, and transfer safety  Person educated: Patient and brother Education method: Explanation, Demonstration, and Handouts Education comprehension: verbalized understanding and returned demonstration  HOME EXERCISE PROGRAM: Access Code: [email or web address] URL: [email or web address] Date: 09/15/2023 Prepared by: Kristeen Sar  Exercises -  Sit to Stand with Counter Support  - 3 x daily - 3-4 x weekly - 1 sets - 1-5 reps - Hooklying Clamshell with Resistance  - 2 x daily - 3-4 x weekly - 1 sets - 10 reps - Seated Long Arc Quad  - 1 x daily - 7 x weekly - 1-3 sets - 10 reps - 5 sec hold - Standing Hip Abduction with Counter Support  - 1 x daily - 7 x weekly - 1-2 sets - 10 reps - Standing March with Counter Support  - 1 x daily - 7 x weekly - 1-2 sets - 10 reps - Standing Heel Raise with Support  - 1 x daily - 7 x weekly - 1-2 sets - 10 reps - Shoulder External Rotation and Scapular Retraction with Resistance  - 1 x daily - 7 x weekly - 2 sets - 10 reps - Sit to Stand with Armchair  - 1 x daily - 7 x weekly - 1 sets - 5 reps  Patient Education - Tips to reduce freezing episodes with standing or walking   ASSESSMENT:  CLINICAL IMPRESSION: The patient had to miss appts secondary to the elevator not working at his residence.  He reports he feels like he's starting all over with more fatigue today than prior visits.  He continues to demonstrate symptoms consistent with NPH including a magnetic gait (difficulty lifting his feet off the floor during gait) and some memory loss.  Altered gait makes him at high risk of falls and severely impacts his mobility.  Although progress is slow, he has shown improvement in function with higher PSFS ratings for standing to brush his teeth, ability to rise from the recliner, put on socks/shoes and walk in his home with the RW.  The patient's condition demonstrates that skilled care is necessary for the performance of a safe and effective program to maintain current condition and prevent deterioration.  Without PT the patient would have a decline in function.   Recommend a continuation of skilled PT.  OBJECTIVE IMPAIRMENTS: Abnormal gait, decreased balance, decreased ROM, decreased strength, decreased safety awareness, dizziness, impaired flexibility, impaired sensation, postural dysfunction, and  pain.   ACTIVITY LIMITATIONS: carrying, lifting, bending, standing, squatting, stairs, transfers, bed mobility, continence, bathing, and locomotion level  PARTICIPATION LIMITATIONS: shopping and community activity  PERSONAL FACTORS: Age, Fitness, Time since onset of injury/illness/exacerbation, and 3+ comorbidities: Compression fx L1 2022, CAD, CKD (stage 4), neuropathy, retinopathy are also affecting patient's functional outcome.   REHAB POTENTIAL: Good  CLINICAL DECISION MAKING: Unstable/unpredictable  EVALUATION COMPLEXITY: High   GOALS: Goals reviewed with patient? Yes  SHORT TERM GOALS: Target date: 07/30/23 Improved TUG to < 25 sec to decrease fall risk.  Baseline: Goal status: deferred  2.  Able to perform 5XSTS without UE assist and controlled descent showing improved LE strength.  Baseline:  Goal status: deferred 3.  Patient will be able to stand safely for 2 min without UE assist Baseline:  Goal status: met 5/27  LONG TERM GOALS: Target date: 04/28/2024  Ind with advanced HEP Baseline:  Goal status: IN PROGRESS   2.  Sit to stand from the recliner with PSFS score of 5 Baseline:  Goal status:  met 7/24  3.  Gait with RW from the bedroom to the sunroom 40-50 feet with PSFS of 3 Baseline:  Goal status: met 7/24  4. Improved LE mobility needed for greater ease with putting on socks and shoes in sitting with PSFS of 5 Baseline:  Goal status: met 7/24  5.  Patient will be able to stand long enough to brush teeth PSFS of 2 Baseline:  Goal status: met 5/27  6. Able to rise from 22 inch seat height push from the armrests to use the new chairs in the living room (in July) Goal status: met 7/24  7. Patient will be able to stand for 12 minutes needed for grooming and dressing tasks at home    Goal status: ongoing                       8. The patient will have improved LE strength with 5x STS test with UE assist improved to 25 sec NEW                       9.  The patient will be able to walk for 6 min with UE support with RW or // bars NEW  PLAN:  PT FREQUENCY: 2x week  PT DURATION: 8 weeks  PLANNED INTERVENTIONS: 97164- PT Re-evaluation, 97110-Therapeutic exercises, 97530- Therapeutic activity, 97112- Neuromuscular re-education, 97535- Self Care, [id number]- Manual therapy, [id number]- Gait training, [phone]- Aquatic Therapy, 97014- Electrical stimulation (unattended), Patient/Family education, Balance training, Stair training, Taping, Joint mobilization, Spinal mobilization, DME instructions, Cryotherapy, and Moist heat  PLAN FOR NEXT SESSION: end of Aug neuro consult;  Nu-Step; dual cable rows;  sit to stand from foam pad; static standing with teal power cord isometric trunk extension;  UE movements while standing for ADLs; gait in // bars working on standing erect, longer step lengths;  assess energy levels and endurance ; functional strengthening  Glade Pesa, PT 03/03/24 7:46 PM Phone: [phone] Fax: [phone]

## 2024-03-04 ENCOUNTER — Ambulatory Visit (HOSPITAL_BASED_OUTPATIENT_CLINIC_OR_DEPARTMENT_OTHER): Admitting: Cardiovascular Disease

## 2024-03-08 ENCOUNTER — Encounter: Payer: Self-pay | Admitting: Physical Therapy

## 2024-03-08 ENCOUNTER — Ambulatory Visit: Admitting: Physical Therapy

## 2024-03-08 DIAGNOSIS — M6281 Muscle weakness (generalized): Secondary | ICD-10-CM

## 2024-03-08 DIAGNOSIS — R2689 Other abnormalities of gait and mobility: Secondary | ICD-10-CM

## 2024-03-08 DIAGNOSIS — R262 Difficulty in walking, not elsewhere classified: Secondary | ICD-10-CM

## 2024-03-08 NOTE — Therapy (Signed)
 OUTPATIENT PHYSICAL THERAPY LOWER EXTREMITY TREATMENT    Patient Name: Jeffrey [name] MRN: [id number] DOB:Jun 15, 1956, 68 y.o., male Today's Date: 03/08/2024    END OF SESSION:  PT End of Session - 03/08/24 1709     Visit Number 43    Date for PT Re-Evaluation 04/28/24    Authorization Type HTA    Progress Note Due on Visit 50    PT Start Time 1450    PT Stop Time 1531    PT Time Calculation (min) 41 min    Activity Tolerance Patient tolerated treatment well    Behavior During Therapy [facility] for tasks assessed/performed                  Past Medical History:  Diagnosis Date   Cardiac arrest ([name]) 05/22/2016   Cataract    CHF (congestive heart failure) ([name])    CKD (chronic kidney disease) stage 3, GFR 30-59 ml/min ([name]) 05/28/2021   CKD (chronic kidney disease) stage 4, GFR 15-29 ml/min ([name]) 05/28/2021   Diabetes mellitus without complication ([name])    type 2   Diabetic retinopathy ([name])    Hyperlipidemia    Hypertension    Memory loss    mild   Myocardial infarct ([name]) 2105   Retinopathy    Both eyes   S/P primary angioplasty with coronary stent 05/22/2016   Sleep-disordered breathing 12/31/2023   Vitamin B12 deficiency    Vitreous hemorrhage of left eye ([name])    proliferative diabetic retinopathy   Past Surgical History:  Procedure Laterality Date   ANGIOPLASTY     2015   COLONOSCOPY     multiple   EYE SURGERY     PARS PLANA VITRECTOMY Left 03/15/2020   Procedure: PARS PLANA VITRECTOMY WITH 25 GAUGE;  Surgeon: Jarold Mayo, MD;  Location: [facility] OR;  Service: Ophthalmology;  Laterality: Left;   PHOTOCOAGULATION WITH LASER Left 03/15/2020   Procedure: PHOTOCOAGULATION WITH LASER; INTRAVITREAL INJECTION OF AVASTIN ;  Surgeon: Jarold Mayo, MD;  Location: [facility] OR;  Service: Ophthalmology;  Laterality: Left;   REFRACTIVE SURGERY     UPPER GI ENDOSCOPY     several   VITRECTOMY     Patient Active Problem List   Diagnosis Date Noted   Sleep-disordered  breathing 12/31/2023   Actinic keratosis 11/30/2023   Vitamin D  deficiency 11/30/2023   Rhinitis 05/20/2023   Cervical spondylosis 08/26/2022   CKD (chronic kidney disease) stage 4, GFR 15-29 ml/min ([name]) 05/28/2021   Adjustment disorder 01/22/2021   Chronic back pain 10/22/2020   Peripheral vertigo 05/15/2020   Dermatitis 01/04/2020   Unintentional weight loss with loose stools 05/19/2019   Low vitamin B12 level 03/29/2019   Heme positive stool 03/14/2019   Normocytic anemia 03/14/2019   Chronic diastolic heart failure ([name]) 02/08/2019   Diabetic retinopathy ([name]) 09/08/2018   Physical debility 05/24/2018   Varicose veins of both lower extremities 03/01/2018   Fatigue due to exertion 10/14/2017   GERD (gastroesophageal reflux disease) 08/25/2017   CAD in native artery 04/14/2017   Hyperlipidemia associated with type 2 diabetes mellitus ([name]) 04/14/2017   Diabetic peripheral neuropathy associated with type 2 diabetes mellitus ([name]) 08/17/2016   [name] 05/02/2016   Hypertension associated with diabetes ([name]) 05/02/2016    PCP: Kennyth Worth HERO, MD   REFERRING PROVIDER: Kennyth Worth HERO, MD   REFERRING DIAG: R53.81 (ICD-10-CM) - Physical debility   THERAPY DIAG:  Muscle weakness (generalized)  Other abnormalities of gait and mobility  Difficulty in walking, not elsewhere classified  Rationale for Evaluation and Treatment: Rehabilitation  ONSET DATE: 2022  SUBJECTIVE:   SUBJECTIVE STATEMENT: Patient reports he is doing okay today. He is a little tired    neurologist  August 28nd regarding NPH   Arrives via transport chair assisted by [name]  Brother: [name] identical twins  PERTINENT HISTORY: 3/11: evidence of lumbar compression deformity Compression fx L1 2022, CAD, CKD (stage 4), DM, cardiac arrest 2017, neuropathy, retinopathy Long history of gait abnormality beginning back in 2022. He has a history of CAD and believes that his  debility began following a covid infection. In December odf 2022 he fell and suffered a compression fracture of L1.  PAIN:  Are you having pain? no  PRECAUTIONS: Fall  RED FLAGS: None   WEIGHT BEARING RESTRICTIONS: No  FALLS:  Has patient fallen in last 6 months? No and with sit to stand falls back sometimes  LIVING ENVIRONMENT: Lives with: lives with their family Lives in: House/apartment Stairs: No Has following equipment at home: Single point cane, Environmental consultant - 2 wheeled, Environmental consultant - 4 wheeled, shower chair, Grab bars, and stand up walker also; also has shower chair at sink for shaving, brushing teeth etc, also has transporter.  OCCUPATION: retired  PLOF: Independent with household mobility with device  PATIENT GOALS: to walk without a walker  NEXT MD VISIT: 09/28/22  OBJECTIVE:  Note: Objective measures were completed at Evaluation unless otherwise noted.  DIAGNOSTIC FINDINGS: N/A  COGNITION: Overall cognitive status: Within functional limits for tasks assessed     SENSATION: Neuropathy in B feet   MUSCLE LENGTH: Marked HS R>L, hip flexor tightness   POSTURE: rounded shoulders and forward head  LOWER EXTREMITY ROM: WFL for tasks assessed  LOWER EXTREMITY MMT: *tested in hooklying  MMT Right eval Left eval 4/1  5/27  Hip flexion 4 4 4  Bil 4 Bil  Hip extension Able to bridge Able to bridge 4- Bil 4- Bil  Hip abduction 4-* 4-* 4- Bil 4- Bil  Hip adduction      Hip internal rotation      Hip external rotation      Knee flexion      Knee extension 4 4+ 4- Bil 4- Bil  Ankle dorsiflexion 4+ 4- 4- Bil 4- Bil  Ankle plantarflexion      Ankle inversion      Ankle eversion       (Blank rows = not tested)  3/20:  max UE assist to rise sit to stand; LE hip extension, knee extension grossly 3+ to 4-/5  FUNCTIONAL TESTS:  30 seconds chair stand test Timed up and go (TUG): 59.36 with 4WW Stance time: 45 sec patient reports unsteadiness and feels he goes  backwards 07/27/2023 92 ft decreased cadence; decreased step length; absent heel strike  12/26: 5x STS: unable to rise without heavy UE use; attempted to use armrests for test but does not come all the way up;  next trial with chair +cushion to touch at least one RW handle 48.2 sec:   TUG: cushion in seat +arm use and RW:  52.49 sec  08/13/23 attempted 5x STS but pt unable to complete: 1st rep does not come all the way up and then plops heavily into the transport wheelchair (he then recounts how this happened at home and how the chair tipped backwards) unable to complete reps Unable to complete TUG today, he does not have his 4 wheeled walker today and feels he  is slower with the clinic RW  09/15/2023 TUG: 1:03 with rollator 5STS:48.55 sec heavy reliance on UE support; last repetition was a challenge  3/20: Heavy UE reliance with STS (unable to push up from [facility] armrests, pulls up from // bars) 4/1: max UE assist with sit to stand  5/27:  5x STS no cushion: able to complete 4 reps 47.19 sec            5x STS with cushion 22.5 seat height 5x 43.09 sec able to push up from [facility] armrests rather than // bars (much less reliance on Ues to rise)  7/24: 5x STS with cushion 53.30 needs UE assist from [facility], 2nd trial 32.22 push from [facility] armrests  GAIT: Distance walked: 25 Assistive device utilized: Walker - 4 wheeled Level of assistance: Modified independence Comments: slow speed, decreased step and stride, decreased heel strike, forward trunk lean.  TRANSFERS: Sit to stand: Uses walker to pull up; stand to sit: does not fully back up to chair and does not reach back; Supine to sit: needs min A for log roll  (has some back pain with sit <>supine due to sitting upward from supine) also fear of falling from bed, sit to supine needs min A to lift legs on to mat table The Patient-Specific Functional Scale  Initial:  I am going to ask you to identify up to 3 important activities that you are unable to  do or are having difficulty with as a result of this problem.  Today are there any activities that you are unable to do or having difficulty with because of this?  (Patient shown scale and patient rated each activity)  Follow up: When you first came in you had difficulty performing these activities.  Today do you still have difficulty?  Patient-Specific activity scoring scheme (Point to one number):  0 1 2 3 4 5 6 7 8 9  10 Unable                                                                                                          Able to perform To perform                                                                                                    activity at the same Activity         Level as before  Injury or problem  Activity            Getting out of the recliner                                                                     Initial:       3                5/27:  4    7/24: 4-5 2.                   Walking with RW 40 feet                                                                Initial:           1                             7/24: 6-7 3.                          Standing to brush teeth                                                           Initial:       0 (has to do sitting on shower chair)     5/27: 3       7/24: 4-5 4. Putting on socks and shoes  while sitting                                                      initial 2-3              7/24:   5  TODAY'S TREATMENT:  03/08/24: Nu-Step L3 5 min UE/LE Seated triceps extension with 2# DB 2 x 10 Gait training in // with gait belt x 4 laps with emphasis on longer step length and upright posture. Patient attempted to turn with UE support he was able to complete 75% of turn before needing UE support Dual cable row column 10# 2 x 10 bilateral  Biceps curl at dual cable 10 2 x 10 Gait training in // with  gait belt x 2 laps with emphasis on longer step length and upright posture.  Patient started worrying about the mass on his brain so he wanted to sit down.   03/03/24: 5x STS from foam cushion in W/C  2 trials PSFS Backwards walking in // bars 2 laps Sidestepping in // bars 4 laps Gait training in // with gait belt x 6 laps with emphasis on longer  step length and toe clearance, arm swing Seated rocker board UE reaching in multi directions  Nu-Step L3 4 min UE/LE  02/23/24: Nu-Step L3 8 min UE/LE Bil arm row at cable column 10# 20 x UE reaching in multi directions  Standing in // bars with hip flexion to blue band at knee level (external cue) 5x 2 sets (2nd set with single arm support) Backwards walking in // bars 2 laps Stepping up and over blue band at ankle level 5x right/left Gait training in // with gait belt x 6 laps Seated blue band clams 12x  02/18/24: Nu-Step L3 5 min UE/LE Leg Pres (seat 6) 50# 2 x 10 Single arm row at cable column 10# 2 x 10 bilateral  Gait training in // with gait belt x 6 laps 6 inch step taps x 20 Seated shoulder raise 4# DB x 10 bilateral  Hurdles in // bar x 2 laps      02/11/24: Nu-Step L3 10 min UE/LE Gait belt on for safety: 1st round 10 min, 2nd round 12 min Dynamic warmup: head turns, UE reaching overhead, UE reaching low, LE taps forward and lateral 6 laps in // bars with decreased reliance on Ues but  Beach ball pass 2 min inside // bars (CGA on gait belt but no loss of balance requiring physical assist) Standing in the // bars kicking beach ball (needs bil UE support) Sit to stand from w/c + airex 2 x 5 with UE support Side stepping in // x 2 laps Backwards walk in // bars 2 laps High step walking in // bars 2 laps Gait in // bars emphasizing longer steps, arm swing Seated dual cable  bil rows  10# on each side  x 10 RPE 5/10  PATIENT EDUCATION:  Education details: PT eval findings, anticipated POC, initial HEP, postural  awareness, and transfer safety  Person educated: Patient and brother Education method: Explanation, Demonstration, and Handouts Education comprehension: verbalized understanding and returned demonstration  HOME EXERCISE PROGRAM: Access Code: [email or web address] URL: [email or web address] Date: 09/15/2023 Prepared by: Kristeen Sar  Exercises - Sit to Stand with Counter Support  - 3 x daily - 3-4 x weekly - 1 sets - 1-5 reps - Hooklying Clamshell with Resistance  - 2 x daily - 3-4 x weekly - 1 sets - 10 reps - Seated Long Arc Quad  - 1 x daily - 7 x weekly - 1-3 sets - 10 reps - 5 sec hold - Standing Hip Abduction with Counter Support  - 1 x daily - 7 x weekly - 1-2 sets - 10 reps - Standing March with Counter Support  - 1 x daily - 7 x weekly - 1-2 sets - 10 reps - Standing Heel Raise with Support  - 1 x daily - 7 x weekly - 1-2 sets - 10 reps - Shoulder External Rotation and Scapular Retraction with Resistance  - 1 x daily - 7 x weekly - 2 sets - 10 reps - Sit to Stand with Armchair  - 1 x daily - 7 x weekly - 1 sets - 5 reps  Patient Education - Tips to reduce freezing episodes with standing or walking   ASSESSMENT:  CLINICAL IMPRESSION: Patient presents with increased fatigue today. Today's treatment session focused on gait training and postural strengthening. Patient was very anxious about the mass on his brain. Educated patient on tactics to help redirect his thoughts when he his thinking about his health conditions. Patient required frequent verbal cues for  upright posture when walking in the // bars. He attempted to turn without using UE support and he was able to complete 75% of turn before requiring UE support. Patient tolerated treatment session well despite being tired. Patient will benefit from skilled PT to address the below impairments and improve overall function.       OBJECTIVE IMPAIRMENTS: Abnormal gait, decreased balance, decreased ROM, decreased strength,  decreased safety awareness, dizziness, impaired flexibility, impaired sensation, postural dysfunction, and pain.   ACTIVITY LIMITATIONS: carrying, lifting, bending, standing, squatting, stairs, transfers, bed mobility, continence, bathing, and locomotion level  PARTICIPATION LIMITATIONS: shopping and community activity  PERSONAL FACTORS: Age, Fitness, Time since onset of injury/illness/exacerbation, and 3+ comorbidities: Compression fx L1 2022, CAD, CKD (stage 4), neuropathy, retinopathy are also affecting patient's functional outcome.   REHAB POTENTIAL: Good  CLINICAL DECISION MAKING: Unstable/unpredictable  EVALUATION COMPLEXITY: High   GOALS: Goals reviewed with patient? Yes  SHORT TERM GOALS: Target date: 07/30/23 Improved TUG to < 25 sec to decrease fall risk.  Baseline: Goal status: deferred  2.  Able to perform 5XSTS without UE assist and controlled descent showing improved LE strength.  Baseline:  Goal status: deferred 3.  Patient will be able to stand safely for 2 min without UE assist Baseline:  Goal status: met 5/27  LONG TERM GOALS: Target date: 04/28/2024  Ind with advanced HEP Baseline:  Goal status: IN PROGRESS   2.  Sit to stand from the recliner with PSFS score of 5 Baseline:  Goal status:  met 7/24  3.  Gait with RW from the bedroom to the sunroom 40-50 feet with PSFS of 3 Baseline:  Goal status: met 7/24  4. Improved LE mobility needed for greater ease with putting on socks and shoes in sitting with PSFS of 5 Baseline:  Goal status: met 7/24  5.  Patient will be able to stand long enough to brush teeth PSFS of 2 Baseline:  Goal status: met 5/27  6. Able to rise from 22 inch seat height push from the armrests to use the new chairs in the living room (in July) Goal status: met 7/24  7. Patient will be able to stand for 12 minutes needed for grooming and dressing tasks at home    Goal status: ongoing                       8. The patient will  have improved LE strength with 5x STS test with UE assist improved to 25 sec NEW                       9. The patient will be able to walk for 6 min with UE support with RW or // bars NEW  PLAN:  PT FREQUENCY: 2x week  PT DURATION: 8 weeks  PLANNED INTERVENTIONS: 97164- PT Re-evaluation, 97110-Therapeutic exercises, 97530- Therapeutic activity, 97112- Neuromuscular re-education, 97535- Self Care, [id number]- Manual therapy, [id number]- Gait training, [phone]- Aquatic Therapy, 97014- Electrical stimulation (unattended), Patient/Family education, Balance training, Stair training, Taping, Joint mobilization, Spinal mobilization, DME instructions, Cryotherapy, and Moist heat  PLAN FOR NEXT SESSION: end of Aug neuro consult;  Nu-Step; dual cable rows;  sit to stand from foam pad; static standing with teal power cord isometric trunk extension;  UE movements while standing for ADLs; gait in // bars working on standing erect, longer step lengths;  assess energy levels and endurance ; functional strengthening     Kristeen Sar, PT  03/08/24 5:10 PM

## 2024-03-10 ENCOUNTER — Ambulatory Visit: Admitting: Physical Therapy

## 2024-03-10 DIAGNOSIS — M6281 Muscle weakness (generalized): Secondary | ICD-10-CM | POA: Diagnosis not present

## 2024-03-10 DIAGNOSIS — R262 Difficulty in walking, not elsewhere classified: Secondary | ICD-10-CM

## 2024-03-10 DIAGNOSIS — R2689 Other abnormalities of gait and mobility: Secondary | ICD-10-CM

## 2024-03-10 NOTE — Therapy (Signed)
 OUTPATIENT PHYSICAL THERAPY LOWER EXTREMITY TREATMENT    Patient Name: [name] MRN: [id number] DOB:[birth year], 68 y.o., male Today's Date: 03/10/2024    END OF SESSION:  PT End of Session - 03/10/24 1450     Visit Number 44    Date for PT Re-Evaluation 04/28/24    Authorization Type HTA    Progress Note Due on Visit 50    PT Start Time 1445    PT Stop Time 1530    PT Time Calculation (min) 45 min    Equipment Utilized During Treatment Gait belt    Activity Tolerance Patient tolerated treatment well                  Past Medical History:  Diagnosis Date   Cardiac arrest ([name]) 05/22/2016   Cataract    CHF (congestive heart failure) ([name])    CKD (chronic kidney disease) stage 3, GFR 30-59 ml/min ([name]) 05/28/2021   CKD (chronic kidney disease) stage 4, GFR 15-29 ml/min ([name]) 05/28/2021   Diabetes mellitus without complication ([name])    type 2   Diabetic retinopathy ([name])    Hyperlipidemia    Hypertension    Memory loss    mild   Myocardial infarct ([name]) 2105   Retinopathy    Both eyes   S/P primary angioplasty with coronary stent 05/22/2016   Sleep-disordered breathing 12/31/2023   Vitamin B12 deficiency    Vitreous hemorrhage of left eye ([name])    proliferative diabetic retinopathy   Past Surgical History:  Procedure Laterality Date   ANGIOPLASTY     2015   COLONOSCOPY     multiple   EYE SURGERY     PARS PLANA VITRECTOMY Left 03/15/2020   Procedure: PARS PLANA VITRECTOMY WITH 25 GAUGE;  Surgeon: [facility], MD;  [name];  Service: Ophthalmology;  Laterality: Left;   PHOTOCOAGULATION WITH LASER Left 03/15/2020   Procedure: PHOTOCOAGULATION WITH LASER; INTRAVITREAL INJECTION OF AVASTIN ;  Surgeon: [facility], MD;  Location: [name];  Service: Ophthalmology;  Laterality: Left;   REFRACTIVE SURGERY     UPPER GI ENDOSCOPY     several   VITRECTOMY     Patient Active Problem List   Diagnosis Date Noted   Sleep-disordered breathing  12/31/2023   Actinic keratosis 11/30/2023   Vitamin D  deficiency 11/30/2023   Rhinitis 05/20/2023   Cervical spondylosis 08/26/2022   CKD (chronic kidney disease) stage 4, GFR 15-29 ml/min ([name]) 05/28/2021   Adjustment disorder 01/22/2021   Chronic back pain 10/22/2020   Peripheral vertigo 05/15/2020   Dermatitis 01/04/2020   Unintentional weight loss with loose stools 05/19/2019   Low vitamin B12 level 03/29/2019   Heme positive stool 03/14/2019   Normocytic anemia 03/14/2019   Chronic diastolic heart failure ([name]) 02/08/2019   Diabetic retinopathy ([name]) 09/08/2018   Physical debility 05/24/2018   Varicose veins of both lower extremities 03/01/2018   Fatigue due to exertion 10/14/2017   GERD (gastroesophageal reflux disease) 08/25/2017   CAD in native artery 04/14/2017   Hyperlipidemia associated with type 2 diabetes mellitus ([name]) 04/14/2017   Diabetic peripheral neuropathy associated with type 2 diabetes mellitus ([name]) 08/17/2016   [name] 05/02/2016   Hypertension associated with diabetes ([name]) 05/02/2016    PCP: [facility] HERO, MD   REFERRING PROVIDER: Kennyth Worth HERO, MD   REFERRING DIAG: R53.81 (ICD-10-CM) - Physical debility   THERAPY DIAG:  Muscle weakness (generalized)  Other abnormalities of gait and mobility  Difficulty  in walking, not elsewhere classified  Rationale for Evaluation and Treatment: Rehabilitation  ONSET DATE: 2022  SUBJECTIVE:   SUBJECTIVE STATEMENT: No complaints.  Did fine after last time.  Patient requests to try walking without an assistive device.     neurologist  August 28nd regarding NPH   Arrives via transport chair assisted by [name]  Brother: [name] identical twins  PERTINENT HISTORY: 3/11: evidence of lumbar compression deformity Compression fx L1 2022, CAD, CKD (stage 4), DM, cardiac arrest 2017, neuropathy, retinopathy Long history of gait abnormality beginning back in 2022. He has a  history of CAD and believes that his debility began following a covid infection. In December odf 2022 he fell and suffered a compression fracture of L1.  PAIN:  Are you having pain? no  PRECAUTIONS: Fall  RED FLAGS: None   WEIGHT BEARING RESTRICTIONS: No  FALLS:  Has patient fallen in last 6 months? No and with sit to stand falls back sometimes  LIVING ENVIRONMENT: Lives with: lives with their family Lives in: House/apartment Stairs: No Has following equipment at home: Single point cane, Environmental consultant - 2 wheeled, Environmental consultant - 4 wheeled, shower chair, Grab bars, and stand up walker also; also has shower chair at sink for shaving, brushing teeth etc, also has transporter.  OCCUPATION: retired  PLOF: Independent with household mobility with device  PATIENT GOALS: to walk without a walker  NEXT MD VISIT: 09/28/22  OBJECTIVE:  Note: Objective measures were completed at Evaluation unless otherwise noted.  DIAGNOSTIC FINDINGS: N/A  COGNITION: Overall cognitive status: Within functional limits for tasks assessed     SENSATION: Neuropathy in B feet   MUSCLE LENGTH: Marked HS R>L, hip flexor tightness   POSTURE: rounded shoulders and forward head  LOWER EXTREMITY ROM: WFL for tasks assessed  LOWER EXTREMITY MMT: *tested in hooklying  MMT Right eval Left eval 4/1  5/27  Hip flexion 4 4 4  Bil 4 Bil  Hip extension Able to bridge Able to bridge 4- Bil 4- Bil  Hip abduction 4-* 4-* 4- Bil 4- Bil  Hip adduction      Hip internal rotation      Hip external rotation      Knee flexion      Knee extension 4 4+ 4- Bil 4- Bil  Ankle dorsiflexion 4+ 4- 4- Bil 4- Bil  Ankle plantarflexion      Ankle inversion      Ankle eversion       (Blank rows = not tested)  3/20:  max UE assist to rise sit to stand; LE hip extension, knee extension grossly 3+ to 4-/5  FUNCTIONAL TESTS:  30 seconds chair stand test Timed up and go (TUG): 59.36 with 4WW Stance time: 45 sec patient reports  unsteadiness and feels he goes backwards 07/27/2023 92 ft decreased cadence; decreased step length; absent heel strike  12/26: 5x STS: unable to rise without heavy UE use; attempted to use armrests for test but does not come all the way up;  next trial with chair +cushion to touch at least one RW handle 48.2 sec:   TUG: cushion in seat +arm use and RW:  52.49 sec  08/13/23 attempted 5x STS but pt unable to complete: 1st rep does not come all the way up and then plops heavily into the transport wheelchair (he then recounts how this happened at home and how the chair tipped backwards) unable to complete reps Unable to complete TUG today, he does not have his 4  wheeled walker today and feels he is slower with the clinic RW  09/15/2023 TUG: 1:03 with rollator 5STS:48.55 sec heavy reliance on UE support; last repetition was a challenge  3/20: Heavy UE reliance with STS (unable to push up from [facility] armrests, pulls up from // bars) 4/1: max UE assist with sit to stand  5/27:  5x STS no cushion: able to complete 4 reps 47.19 sec            5x STS with cushion 22.5 seat height 5x 43.09 sec able to push up from [facility] armrests rather than // bars (much less reliance on Ues to rise)  7/24: 5x STS with cushion 53.30 needs UE assist from [facility], 2nd trial 32.22 push from [facility] armrests  GAIT: Distance walked: 25 Assistive device utilized: Walker - 4 wheeled Level of assistance: Modified independence Comments: slow speed, decreased step and stride, decreased heel strike, forward trunk lean.  TRANSFERS: Sit to stand: Uses walker to pull up; stand to sit: does not fully back up to chair and does not reach back; Supine to sit: needs min A for log roll  (has some back pain with sit <>supine due to sitting upward from supine) also fear of falling from bed, sit to supine needs min A to lift legs on to mat table The Patient-Specific Functional Scale  Initial:  I am going to ask you to identify up to 3 important  activities that you are unable to do or are having difficulty with as a result of this problem.  Today are there any activities that you are unable to do or having difficulty with because of this?  (Patient shown scale and patient rated each activity)  Follow up: When you first came in you had difficulty performing these activities.  Today do you still have difficulty?  Patient-Specific activity scoring scheme (Point to one number):  0 1 2 3 4 5 6 7 8 9  10 Unable                                                                                                          Able to perform To perform                                                                                                    activity at the same Activity         Level as before  Injury or problem  Activity            Getting out of the recliner                                                                     Initial:       3                5/27:  4    7/24: 4-5 2.                   Walking with RW 40 feet                                                                Initial:           1                             7/24: 6-7 3.                          Standing to brush teeth                                                           Initial:       0 (has to do sitting on shower chair)     5/27: 3       7/24: 4-5 4. Putting on socks and shoes  while sitting                                                      initial 2-3              7/24:   5  TODAY'S TREATMENT:  03/10/24: Nu-Step L3 5 min UE/LE Sit to stand from [facility] + cushion 5x pushing from armrests Gait without assistive device/outside // bars 35 feet with WC following close behind and close mod assist Gait training in // with gait belt x 4 laps with emphasis on longer step length and upright posture. Able to make one turn without  UE support UE reaching in  multi directions; head turns, reaching with turning head, trunk rotation Biceps curl/row 10# at dual cable  2 x 10    03/08/24: Nu-Step L3 5 min UE/LE Seated triceps extension with 2# DB 2 x 10 Gait training in // with gait belt x 4 laps with emphasis on longer step length and upright posture. Patient attempted to turn with UE support he was able to complete 75% of turn before needing UE support Dual cable row column 10# 2 x  10 bilateral  Biceps curl at dual cable 10 2 x 10 Gait training in // with gait belt x 2 laps with emphasis on longer step length and upright posture.  Patient started worrying about the mass on his brain so he wanted to sit down.   03/03/24: 5x STS from foam cushion in W/C  2 trials PSFS Backwards walking in // bars 2 laps Sidestepping in // bars 4 laps Gait training in // with gait belt x 6 laps with emphasis on longer step length and toe clearance, arm swing Seated rocker board UE reaching in multi directions  Nu-Step L3 4 min UE/LE  02/23/24: Nu-Step L3 8 min UE/LE Bil arm row at cable column 10# 20 x UE reaching in multi directions  Standing in // bars with hip flexion to blue band at knee level (external cue) 5x 2 sets (2nd set with single arm support) Backwards walking in // bars 2 laps Stepping up and over blue band at ankle level 5x right/left Gait training in // with gait belt x 6 laps Seated blue band clams 12x  02/18/24: Nu-Step L3 5 min UE/LE Leg Pres (seat 6) 50# 2 x 10 Single arm row at cable column 10# 2 x 10 bilateral  Gait training in // with gait belt x 6 laps 6 inch step taps x 20 Seated shoulder raise 4# DB x 10 bilateral  Hurdles in // bar x 2 laps     PATIENT EDUCATION:  Education details: PT eval findings, anticipated POC, initial HEP, postural awareness, and transfer safety  Person educated: Patient and brother Education method: Explanation, Demonstration, and Handouts Education comprehension: verbalized understanding and  returned demonstration  HOME EXERCISE PROGRAM: Access Code: [email or web address] URL: [email or web address] Date: 09/15/2023 Prepared by: Kristeen Sar  Exercises - Sit to Stand with Counter Support  - 3 x daily - 3-4 x weekly - 1 sets - 1-5 reps - Hooklying Clamshell with Resistance  - 2 x daily - 3-4 x weekly - 1 sets - 10 reps - Seated Long Arc Quad  - 1 x daily - 7 x weekly - 1-3 sets - 10 reps - 5 sec hold - Standing Hip Abduction with Counter Support  - 1 x daily - 7 x weekly - 1-2 sets - 10 reps - Standing March with Counter Support  - 1 x daily - 7 x weekly - 1-2 sets - 10 reps - Standing Heel Raise with Support  - 1 x daily - 7 x weekly - 1-2 sets - 10 reps - Shoulder External Rotation and Scapular Retraction with Resistance  - 1 x daily - 7 x weekly - 2 sets - 10 reps - Sit to Stand with Armchair  - 1 x daily - 7 x weekly - 1 sets - 5 reps  Patient Education - Tips to reduce freezing episodes with standing or walking   ASSESSMENT:  CLINICAL IMPRESSION: Pt states, I'm really proud of myself!  Pt requests to try ambulating without assistive device, outside of the // bars.  Very close CGA on gait belt for safety as well as a 2nd person following closely with the WC.  He is able to ambulate 42 feet with mod cues for head forward, step length and arm swing.  He is very excited about this and feels very motivated.  Improved foot clearance and longer strides today (pt wearing white SAS shoes with socks rather than his usual diabetic shoes and compression socks.)  OBJECTIVE IMPAIRMENTS: Abnormal gait, decreased balance, decreased ROM, decreased strength, decreased safety awareness, dizziness, impaired flexibility, impaired sensation, postural dysfunction, and pain.   ACTIVITY LIMITATIONS: carrying, lifting, bending, standing, squatting, stairs, transfers, bed mobility, continence, bathing, and locomotion level  PARTICIPATION LIMITATIONS: shopping and community  activity  PERSONAL FACTORS: Age, Fitness, Time since onset of injury/illness/exacerbation, and 3+ comorbidities: Compression fx L1 2022, CAD, CKD (stage 4), neuropathy, retinopathy are also affecting patient's functional outcome.   REHAB POTENTIAL: Good  CLINICAL DECISION MAKING: Unstable/unpredictable  EVALUATION COMPLEXITY: High   GOALS: Goals reviewed with patient? Yes  SHORT TERM GOALS: Target date: 07/30/23 Improved TUG to < 25 sec to decrease fall risk.  Baseline: Goal status: deferred  2.  Able to perform 5XSTS without UE assist and controlled descent showing improved LE strength.  Baseline:  Goal status: deferred 3.  Patient will be able to stand safely for 2 min without UE assist Baseline:  Goal status: met 5/27  LONG TERM GOALS: Target date: 04/28/2024  Ind with advanced HEP Baseline:  Goal status: IN PROGRESS   2.  Sit to stand from the recliner with PSFS score of 5 Baseline:  Goal status:  met 7/24  3.  Gait with RW from the bedroom to the sunroom 40-50 feet with PSFS of 3 Baseline:  Goal status: met 7/24  4. Improved LE mobility needed for greater ease with putting on socks and shoes in sitting with PSFS of 5 Baseline:  Goal status: met 7/24  5.  Patient will be able to stand long enough to brush teeth PSFS of 2 Baseline:  Goal status: met 5/27  6. Able to rise from 22 inch seat height push from the armrests to use the [facility] chairs in the living room (in July) Goal status: met 7/24  7. Patient will be able to stand for 12 minutes needed for grooming and dressing tasks at home    Goal status: ongoing                       8. The patient will have improved LE strength with 5x STS test with UE assist improved to 25 sec Jeffrey                       9. The patient will be able to walk for 6 min with UE support with RW or // bars Jeffrey  PLAN:  PT FREQUENCY: 2x week  PT DURATION: 8 weeks  PLANNED INTERVENTIONS: 97164- PT Re-evaluation, 97110-Therapeutic  exercises, 97530- Therapeutic activity, 97112- Neuromuscular re-education, 97535- Self Care, [id number]- Manual therapy, [id number]- Gait training, [phone]- Aquatic Therapy, 97014- Electrical stimulation (unattended), Patient/Family education, Balance training, Stair training, Taping, Joint mobilization, Spinal mobilization, DME instructions, Cryotherapy, and Moist heat  PLAN FOR NEXT SESSION: end of Aug neuro consult;  Nu-Step; dual cable rows;  sit to stand from foam pad; static standing with teal power cord isometric trunk extension;  UE movements while standing for ADLs; gait in // bars working on standing erect, longer step lengths;  assess energy levels and endurance ; functional strengthening   Glade Pesa, PT 03/10/24 5:31 PM Phone: [phone] Fax: [phone]

## 2024-03-14 ENCOUNTER — Ambulatory Visit: Admitting: Physical Therapy

## 2024-03-15 NOTE — Progress Notes (Unsigned)
 Initial neurology clinic note  Reason for Evaluation: Consultation requested by Jeffrey Worth HERO, MD for an opinion regarding weakness, difficulty ambulating, abnormal MRI brain. My final recommendations will be communicated back to the requesting physician by way of shared medical record or letter to requesting physician via [place]  mail.  HPI: This is [name], a 68 y.o. right-handed male with a medical history of DM c/b retinopathy, HTN, HLD, CHF, MI, cardiac arrest, CKD, B12 deficiency, vit D deficiency, OSA who presents to neurology clinic with the chief complaint of difficulty with ambulation. The patient is accompanied by brother, Jeffrey [name].  Patient was not sure he is having symptoms when asked. His brother states that everything started on 12/29/23 when patient was in the bathroom and felt off, like an out of body experience. Brother took patient to the ED. Per ED notes, and confirmed with patient, he was having weakness and lightheadedness when standing. CTH in ED showed ventriculomegaly concerning for NPH. MRI brain on 01/15/24 was read by radiology as ventriculomegaly and callosal angle of 59 degrees, again concerning for NPH (Evan's index of 0.34, callosal angle by my calculation was ~93 degrees). Importantly, significant cerebral atrophy also seen, as seen in hydrocephalus ex vacuo.  Patient is in clinic in a wheelchair today. When asked about this, he walks with a walker at home and uses the transport chair for longer distances. Brother states he is noticed to not being able to move sometimes when he wants, or will shake when he tries to walk. This has been going on for more than 5 years. He thinks his legs can feel weak. Patient has been told he has neuropathy when he lived in [place] and was on gabapentin  (again longer than 5 years). He stopped this some time ago and does not endorse any pain, numbness, or tingling in his legs/feet. He denies any symptoms in his arms. He denies neck or back  pain. He has had one fall around 07/2023. He hit his nightstand and floor and fractured his back per patient. He saw Dr. Georgina in the past due to this and per notes had an L1 compression fracture.  He denies difficulty smelling. He and his brother denies REM sleep disorder. He denies tremor.   Patient does endorse memory problems. He cannot remember the times of his appointments and needs to ask frequently about his schedule. He has trouble remember his name. He does not drive. He is independent, but does get some help from brother at home (cooking, cleaning, helping him get in and out of shower, getting around the house).  Patient wears Depends because he will occasionally be wet, not knowing he needs to urinate. Sometimes at night he will know he needs to urinate, but will not go because he is afraid to fall. He denies difficulties with his bowel.  The patient denies symptoms suggestive of oculobulbar weakness including diplopia, ptosis, dysphagia, dysarthria/dysphonia, impaired mastication, facial weakness/droop.  He does endorse significant salvia production at night requiring a sleep bucket and keeping him up at night. This does not happen during the day. He feels tired during the day due to this. Per patient and brother, they are not aware of OSA diagnosis and is not on CPAP.  There are no neuromuscular respiratory weakness symptoms, particularly orthopnea>dyspnea.   Patient states he does not have much energy and cannot do much activity due to his heart failure since MI.  He does not report any constitutional symptoms like fever, night sweats, anorexia  or unintentional weight loss.  He is doing PT twice weekly.  He takes vitamin D .   EtOH use: 1 beer every 2 weeks  Restrictive diet? No Family history of neurologic disease? Mother with Alzheimer's dementia and a lot of strokes    MEDICATIONS:  Outpatient Encounter Medications as of 03/16/2024  Medication Sig Note   acetaminophen   (TYLENOL ) 325 MG tablet Take 325-650 mg by mouth every 6 (six) hours as needed for moderate pain.    aspirin  EC 81 MG tablet Take 81 mg by mouth daily.    atorvastatin  (LIPITOR ) 80 MG tablet TAKE 1 TABLET BY MOUTH EVERY DAY    azelastine  (ASTELIN ) 0.1 % nasal spray Place 2 sprays into both nostrils 2 (two) times daily. (Patient taking differently: Place 2 sprays into both nostrils as needed.)    Blood Glucose Monitoring Suppl (ONETOUCH VERIO IQ SYSTEM) w/Device KIT 1 kit by Does not apply route 4 (four) times daily.    carvedilol  (COREG ) 3.125 MG tablet TAKE 1 TABLET BY MOUTH 2 TIMES DAILY WITH MEAL    furosemide  (LASIX ) 40 MG tablet TAKE 1 TABLET BY MOUTH EVERY DAY    glucose blood (ONETOUCH VERIO) test strip USE TO CHECK BLOOD SUGAR EVERY 3 HOURS AS NEEDED.    insulin  aspart (NOVOLOG ) 100 UNIT/ML injection USE 3X DAILY AS NEEDED FOR HIGH BLOOD SUGARS. 3 UNITS FOR SUGAR >200, 6 UNITS >300, 9 UNITS 400+ (Patient taking differently: as needed. USE 3X DAILY AS NEEDED FOR HIGH BLOOD SUGARS. 3 UNITS FOR SUGAR >200, 6 UNITS >300, 9 UNITS 400+)    insulin  glargine (LANTUS  SOLOSTAR) 100 UNIT/ML Solostar Pen INJECT 34-50 UNITS INTO THE SKIN DAILY.    Insulin  Pen Needle (PEN NEEDLES) 33G X 4 MM MISC 1 application  by Does not apply route in the morning, at noon, in the evening, and at bedtime.    Insulin  Syringe-Needle U-100 (B-D INS SYR HALF-UNIT .3CC/31G) 31G X 5/16 0.3 ML MISC Use 6 times a day    ondansetron  (ZOFRAN ) 4 MG tablet Take 1 tablet (4 mg total) by mouth every 8 (eight) hours as needed for nausea or vomiting.    OneTouch Delica Lancets 33G MISC Use to check blood sugar 4 times per day    spironolactone  (ALDACTONE ) 25 MG tablet TAKE 1 TABLET (25 MG TOTAL) BY MOUTH DAILY. (Patient taking differently: Take 12.5 mg by mouth daily.)    blood glucose meter kit and supplies Dispense based on patient and insurance preference. Use up to four times daily as directed. (FOR ICD-10 E10.9, E11.9). (Patient  not taking: Reported on 03/16/2024)    Continuous Glucose Receiver (FREESTYLE LIBRE 3 READER) DEVI Use as directed (Patient not taking: Reported on 03/16/2024)    triamcinolone  cream (KENALOG ) 0.1 % Apply 1 application topically 2 (two) times daily. (Patient not taking: Reported on 03/16/2024) 02/29/2020: Hasnt started   No facility-administered encounter medications on file as of 03/16/2024.    PAST MEDICAL HISTORY: Past Medical History:  Diagnosis Date   Cardiac arrest ([name]) 05/22/2016   Cataract    CHF (congestive heart failure) ([name])    CKD (chronic kidney disease) stage 3, GFR 30-59 ml/min ([name]) 05/28/2021   CKD (chronic kidney disease) stage 4, GFR 15-29 ml/min ([name]) 05/28/2021   Diabetes mellitus without complication ([name])    type 2   Diabetic retinopathy ([name])    Hyperlipidemia    Hypertension    Memory loss    mild   Myocardial infarct ([name]) 2105   Retinopathy  Both eyes   S/P primary angioplasty with coronary stent 05/22/2016   Sleep-disordered breathing 12/31/2023   Vitamin B12 deficiency    Vitreous hemorrhage of left eye ([name])    proliferative diabetic retinopathy    PAST SURGICAL HISTORY: Past Surgical History:  Procedure Laterality Date   ANGIOPLASTY     2015   COLONOSCOPY     multiple   EYE SURGERY     PARS PLANA VITRECTOMY Left 03/15/2020   Procedure: PARS PLANA VITRECTOMY WITH 25 GAUGE;  Surgeon: Jarold Mayo, MD;  Location[facility] OR;  Service: Ophthalmology;  Laterality: Left;   PHOTOCOAGULATION WITH LASER Left 03/15/2020   Procedure: PHOTOCOAGULATION WITH LASER; INTRAVITREAL INJECTION OF AVASTIN ;  Surgeon: Jarold Mayo, MD;  Location: [facility] OR;  Service: Ophthalmology;  Laterality: Left;   REFRACTIVE SURGERY     UPPER GI ENDOSCOPY     several   VITRECTOMY      ALLERGIES: Allergies  Allergen Reactions   Codeine Nausea And Vomiting   Farxiga  [Dapagliflozin ]     Constipation   Gabapentin      Mood changes, depression    Losartan     Penicillins Rash    Did  it involve swelling of the face/tongue/throat, SOB, or low BP? no Did it involve sudden or severe rash/hives, skin peeling, or any reaction on the inside of your mouth or nose? yes Did you need to seek medical attention at a hospital or doctor's office? yes When did it last happen?    childhood   If all above answers are "NO", may proceed with cephalosporin use.     FAMILY HISTORY: Family History  Problem Relation Age of Onset   Alzheimer's disease Mother    Heart disease Father    Peripheral Artery Disease Father    Heart disease Brother    Colon polyps Neg Hx    Prostate cancer Neg Hx    Rectal cancer Neg Hx    Esophageal cancer Neg Hx     SOCIAL HISTORY: Social History   Tobacco Use   Smoking status: Never   Smokeless tobacco: Never  Vaping Use   Vaping status: Never Used  Substance Use Topics   Alcohol use: Not Currently    Comment: occ   Drug use: No   Social History   Social History Narrative   Are you right handed or left handed? Right   Are you currently employed ?    What is your current occupation? retired   Do you live at home alone?   Who lives with you? Twin    What type of home do you live in: 1 story or 2 story? one   Caffeine none      OBJECTIVE: PHYSICAL EXAM: BP 130/69   Pulse 80   Ht 5' 11 (1.803 m)   Wt 173 lb (78.5 kg)   SpO2 96%   BMI 24.13 kg/m   General: General appearance: Awake and alert. No distress. Cooperative with exam.  Skin: No obvious rash or jaundice. HEENT: Atraumatic. Anicteric. Lungs: Non-labored breathing on room air  Heart: Regular Extremities: No edema. Psych: Affect appropriate.  Neurological: Mental Status: Alert. Speech fluent. No pseudobulbar affect Cranial Nerves: CNII: No RAPD. Visual fields grossly intact. CNIII, IV, VI: PERRL. No nystagmus. EOMI. CN V: Facial sensation intact bilaterally to fine touch. CN VII: Facial muscles symmetric and strong. No ptosis at rest. CN VIII: Hearing grossly  intact bilaterally. CN IX: No hypophonia. CN X: Palate elevates symmetrically. CN XI: Full strength shoulder  shrug bilaterally. CN XII: Tongue protrusion full and midline. No atrophy or fasciculations. No significant dysarthria Motor: Tone is mildly increased, likely poor relaxation/paratonia. No tremor or other abnormal movements appreciated. Left intrinsic hand muscle atrophy.  Individual muscle group testing (MRC grade out of 5):  Movement     Neck flexion 5    Neck extension 5     Right Left   Shoulder abduction 5 5   Elbow flexion 5 5   Elbow extension 5 5   Finger abduction - FDI 5 4   Finger abduction - ADM 5 4   Finger extension 5 5   Finger distal flexion - 2/3 5 4    Finger distal flexion - 4/5 5 4    Thumb flexion - FPL 5 4+   Thumb abduction - APB 5 5-    Hip flexion 5 4+   Hip extension 5 5-   Hip adduction 5 5   Hip abduction 5 5   Knee extension 5 4+   Knee flexion 5 4+   Dorsiflexion 5 5-   Plantarflexion 5 5     Reflexes:  Right Left   Bicep 2+ 2+   Tricep 2+ 2+   BrRad 2+ 2+   Knee 0 0   Ankle 0 0    Pathological Reflexes: Babinski: mute response bilaterally Hoffman: absent bilaterally Troemner: absent bilaterally Sensation: Pinprick: Diminished in LUE forearm and hand and bilateral lower extremities to the knees Vibration: Absent in bilateral lower limbs to the knees Proprioception: Absent in bilateral great toes Coordination: Intact finger-to- nose-finger bilaterally. Very slow finger tapping, but no clear decrementing. Gait: Unable to rise from chair with arms crossed unassisted. Wide based, short, shuffling steps. Only able to take a few steps due to patient fear of falling.  Lab and Test Review: Internal labs: 12/31/23: CMP significant for glucose 107, BUN 50, Cr 2.21  12/29/23: CBC w/ differential: significant for WBC 11.5 (neutrophilic predominance)  11/26/23: B12: 481 Vit D: low at 26.66 Lipid panel: tChol 103, LDL 53, TG  92.0 TSH wnl HbA1c: 7.8  Imaging/Procedures: MRI cervical spine wo contrast (08/17/22): IMPRESSION: 1. Multilevel cervical spondylosis, most pronounced at the C3-4 level where there is moderate-severe left foraminal stenosis. 2. Moderate bilateral foraminal stenosis at C6-7. 3. No significant canal stenosis at any level.  CT head wo contrast (12/29/23): FINDINGS: Brain: Ventriculomegaly with callosum angle narrowing and disproportionate subarachnoid spaces. Mild low-density in the cerebral white matter, usually chronic small vessel ischemia. No acute infarct, hemorrhage, obstructive hydrocephalus, mass, or collection.   Vascular: No hyperdense vessel or unexpected calcification.   Skull: Normal. Negative for fracture or focal lesion.   Sinuses/Orbits: No acute finding.   IMPRESSION: Chronic ventriculomegaly with features seen in normal pressure hydrocephalus, correlate for associated symptomatology.   Mild chronic white matter disease.   No acute finding.  MRI brain wo contrast (01/15/24): Per my read:  Significant cerebral atrophy with large ventricles. Evans Index is 0.34 which may be consistent with ventriculomegaly vs hydrocephalus ex vacuo. Callosal angle per my measurement was closer to 93 degrees.  Radiology read: FINDINGS: Brain: Chronic ventricular enlargement is stable. The callosal angle measures 59 degrees.   Mild periventricular T2 hyperintensities are present bilaterally. No acute infarct or hemorrhage is present. Mild white matter changes extend into the brainstem.   Deep brain nuclei are within normal limits. No significant extraaxial fluid collection is present.   The internal auditory canals are within normal limits. Midline structures are within normal  limits.   Vascular: Flow is present in the major intracranial arteries.   Skull and upper cervical spine: The craniocervical junction is normal. Upper cervical spine is within normal limits. Marrow  signal is unremarkable.   Sinuses/Orbits: The paranasal sinuses and mastoid air cells are clear. The globes and orbits are within normal limits.   IMPRESSION: 1. Stable chronic ventricular enlargement. The callosal angle measures 59 degrees. This is consistent with normal pressure hydrocephalus. 2. Mild periventricular T2 hyperintensities bilaterally extend into the brainstem. This likely reflects the sequela of chronic microvascular ischemia. 3. No acute intracranial abnormality or significant interval change.  ASSESSMENT: Yates Weisgerber is a 68 y.o. male who presents for evaluation of difficulty with ambulation and abnormal MRI brain. He has a relevant medical history of DM c/b retinopathy, HTN, HLD, CHF, MI, cardiac arrest, CKD, B12 deficiency, vit D deficiency, OSA. His neurological examination is pertinent for left sided weakness, diminished sensation in left arm and bilateral lower limbs, hyporeflexia below the knees, and difficulty ambulating. There is potential bradykinesia as well. Available diagnostic data is significant for MRI brain showing significant cerebral atrophy and large ventricles.   This is a complex case. Patient's symptoms appear very chronic and not acute. Patient appears to have left sided deficits that may be due to cervical spine disease seen in 2024. Given the left hand atrophy, this appears to be chronic. His walking has been poor for over 5 years. He was previously diagnosed with diabetic neuropathy, which his examination supports. This could be contributing to poor ambulation due to poor sensation in his legs. There is possible bradykinesia on examination, so parkinsonism is possible, but this is not completely clear at this time.  In terms of the changes reported on his MRI brain, I disagree that the ventricles are enlarged due to normal pressure hydrocephalus (based on the MRI). The ventricles appear large to me due to significant atrophy and resultant  hydrocephalus ex vacuo. I explained my opinion to patient, and while I am not convinced his symptoms are due to NPH, I am willing to order LP with pre and post PT evaluation to work up NPH. This is an invasive procedure though, so patient would like to think about it, which is appropriate.  Patient does have cognitive changes that cerebral atrophy is likely contributing to, however, he may also have sleep apnea, which if untreated is a known cause of cognition changes that are potentially reversible. Patient is open to seeing sleep medicine as his sleep is poor.  PLAN: -Blood work: IFE, vit E, copper  -Sleep medicine referral for possible OSA -Continue PT -Discussed LP, patient to think about it -Discussed inspection of feet  -Return to clinic in 6 months  The impression above as well as the plan as outlined below were extensively discussed with the patient (in the company of brother) who voiced understanding. All questions were answered to their satisfaction.  The patient was counseled on pertinent fall precautions per the printed material provided today, and as noted under the Patient Instructions section below.  When available, results of the above investigations and possible further recommendations will be communicated to the patient via telephone/MyChart. Patient to call office if not contacted after expected testing turnaround time.   Total time spent reviewing records, interview, history/exam, documentation, and coordination of care on day of encounter:  75 min   Thank you for allowing me to participate in patient's care.  If I can answer any additional questions, I would be pleased to  do so.  Venetia Potters, MD   CC: Jeffrey Worth HERO, MD [address] [place] [address]  CC: Referring provider: Kennyth Worth HERO, MD [address] [place],  [place] [address]

## 2024-03-16 ENCOUNTER — Other Ambulatory Visit

## 2024-03-16 ENCOUNTER — Encounter: Payer: Self-pay | Admitting: Neurology

## 2024-03-16 ENCOUNTER — Ambulatory Visit: Attending: Family Medicine | Admitting: Physical Therapy

## 2024-03-16 ENCOUNTER — Ambulatory Visit: Admitting: Neurology

## 2024-03-16 VITALS — BP 130/69 | HR 80 | Ht 71.0 in | Wt 173.0 lb

## 2024-03-16 DIAGNOSIS — R2689 Other abnormalities of gait and mobility: Secondary | ICD-10-CM | POA: Insufficient documentation

## 2024-03-16 DIAGNOSIS — R269 Unspecified abnormalities of gait and mobility: Secondary | ICD-10-CM | POA: Diagnosis not present

## 2024-03-16 DIAGNOSIS — G629 Polyneuropathy, unspecified: Secondary | ICD-10-CM

## 2024-03-16 DIAGNOSIS — R262 Difficulty in walking, not elsewhere classified: Secondary | ICD-10-CM | POA: Insufficient documentation

## 2024-03-16 DIAGNOSIS — G4733 Obstructive sleep apnea (adult) (pediatric): Secondary | ICD-10-CM | POA: Diagnosis not present

## 2024-03-16 DIAGNOSIS — M47812 Spondylosis without myelopathy or radiculopathy, cervical region: Secondary | ICD-10-CM

## 2024-03-16 DIAGNOSIS — R258 Other abnormal involuntary movements: Secondary | ICD-10-CM

## 2024-03-16 DIAGNOSIS — Z72820 Sleep deprivation: Secondary | ICD-10-CM

## 2024-03-16 DIAGNOSIS — M6281 Muscle weakness (generalized): Secondary | ICD-10-CM | POA: Diagnosis not present

## 2024-03-16 NOTE — Patient Instructions (Addendum)
 I saw you today for difficulty walking. There was also question of normal pressure hydrocephalus on your MRI brain. I do not think the MRI brain shows this. I think your brain has shrinkage from chronic problems like heart disease, high blood pressure, diabetes, and kidney disease that explain the changes on MRI brain.  Your walking difficulties appear to me to be mostly due to neuropathy. Of course, I cannot be completely sure you do not have normal pressure hydrocephalus unless you get a lumbar puncture to check. We discussed this and that I'm not sure if you need it, but if you wanted it, I would order it. You wanted to think about it and decide. Let me know if you want it.  I will check labs today. I will be in touch when I have the results.  I will refer you to sleep medicine due to the question of sleep apnea.  I want you to continue to do physical therapy.  I will see you back in clinic in 6 months or sooner if needed. Please let me know if you have any questions or concerns in the meantime.  The physicians and staff at [facility] are committed to providing excellent care. You may receive a survey requesting feedback about your experience at our office. We strive to receive very good responses to the survey questions. If you feel that your experience would prevent you from giving the office a very good  response, please contact our office to try to remedy the situation. We may be reached at [phone]. Thank you for taking the time out of your busy day to complete the survey.  Venetia Potters, MD [facility]  Preventing Falls at [facility] are common, often dreaded events in the lives of older people. Aside from the obvious injuries and even death that may result, fall can cause wide-ranging consequences including loss of independence, mental decline, decreased activity and mobility. Younger people are also at risk of falling, especially those with chronic illnesses and  fatigue.  Ways to reduce risk for falling Examine diet and medications. Warm foods and alcohol dilate blood vessels, which can lead to dizziness when standing. Sleep aids, antidepressants and pain medications can also increase the likelihood of a fall.  Get a vision exam. Poor vision, cataracts and glaucoma increase the chances of falling.  Check foot gear. Shoes should fit snugly and have a sturdy, nonskid sole and a broad, low heel  Participate in a physician-approved exercise program to build and maintain muscle strength and improve balance and coordination. Programs that use ankle weights or stretch bands are excellent for muscle-strengthening. Water aerobics programs and low-impact Tai Chi programs have also been shown to improve balance and coordination.  Increase vitamin D  intake. Vitamin D  improves muscle strength and increases the amount of calcium  the body is able to absorb and deposit in bones.  How to prevent falls from common hazards Floors - Remove all loose wires, cords, and throw rugs. Minimize clutter. Make sure rugs are anchored and smooth. Keep furniture in its usual place.  Chairs -- Use chairs with straight backs, armrests and firm seats. Add firm cushions to existing pieces to add height.  Bathroom - Install grab bars and non-skid tape in the tub or shower. Use a bathtub transfer bench or a shower chair with a back support Use an elevated toilet seat and/or safety rails to assist standing from a low surface. Do not use towel racks or bathroom tissue holders to  help you stand.  Lighting - Make sure halls, stairways, and entrances are well-lit. Install a night light in your bathroom or hallway. Make sure there is a light switch at the top and bottom of the staircase. Turn lights on if you get up in the middle of the night. Make sure lamps or light switches are within reach of the bed if you have to get up during the night.  Kitchen - Install non-skid rubber mats near the  sink and stove. Clean spills immediately. Store frequently used utensils, pots, pans between waist and eye level. This helps prevent reaching and bending. Sit when getting things out of lower cupboards.  Living room/ Bedrooms - Place furniture with wide spaces in between, giving enough room to move around. Establish a route through the living room that gives you something to hold onto as you walk.  Stairs - Make sure treads, rails, and rugs are secure. Install a rail on both sides of the stairs. If stairs are a threat, it might be helpful to arrange most of your activities on the lower level to reduce the number of times you must climb the stairs.  Entrances and doorways - Install metal handles on the walls adjacent to the doorknobs of all doors to make it more secure as you travel through the doorway.  Tips for maintaining balance Keep at least one hand free at all times. Try using a backpack or fanny pack to hold things rather than carrying them in your hands. Never carry objects in both hands when walking as this interferes with keeping your balance.  Attempt to swing both arms from front to back while walking. This might require a conscious effort if Parkinson's disease has diminished your movement. It will, however, help you to maintain balance and posture, and reduce fatigue.  Consciously lift your feet off of the ground when walking. Shuffling and dragging of the feet is a common culprit in losing your balance.  When trying to navigate turns, use a U technique of facing forward and making a wide turn, rather than pivoting sharply.  Try to stand with your feet shoulder-length apart. When your feet are close together for any length of time, you increase your risk of losing your balance and falling.  Do one thing at a time. Don't try to walk and accomplish another task, such as reading or looking around. The decrease in your automatic reflexes complicates motor function, so the less  distraction, the better.  Do not wear rubber or gripping soled shoes, they might catch on the floor and cause tripping.  Move slowly when changing positions. Use deliberate, concentrated movements and, if needed, use a grab bar or walking aid. Count 15 seconds between each movement. For example, when rising from a seated position, wait 15 seconds after standing to begin walking.  If balance is a continuous problem, you might want to consider a walking aid such as a cane, walking stick, or walker. Once you've mastered walking with help, you might be ready to try it on your own again.

## 2024-03-16 NOTE — Therapy (Signed)
 OUTPATIENT PHYSICAL THERAPY LOWER EXTREMITY TREATMENT    Patient Name: Jeffrey [name] MRN: [id number] DOB:[birth year], 68 y.o., male Today's Date: 03/16/2024    END OF SESSION:  PT End of Session - 03/16/24 1404     Visit Number 45    Date for PT Re-Evaluation 04/28/24    Authorization Type HTA    Progress Note Due on Visit 50    PT Start Time 1400    PT Stop Time 1440    PT Time Calculation (min) 40 min    Equipment Utilized During Treatment Gait belt    Activity Tolerance Patient tolerated treatment well                  Past Medical History:  Diagnosis Date   Cardiac arrest ([name]) 05/22/2016   Cataract    CHF (congestive heart failure) ([name])    CKD (chronic kidney disease) stage 3, GFR 30-59 ml/min ([name]) 05/28/2021   CKD (chronic kidney disease) stage 4, GFR 15-29 ml/min ([name]) 05/28/2021   Diabetes mellitus without complication ([name])    type 2   Diabetic retinopathy ([name])    Hyperlipidemia    Hypertension    Memory loss    mild   Myocardial infarct ([name]) 2105   Retinopathy    Both eyes   S/P primary angioplasty with coronary stent 05/22/2016   Sleep-disordered breathing 12/31/2023   Vitamin B12 deficiency    Vitreous hemorrhage of left eye ([name])    proliferative diabetic retinopathy   Past Surgical History:  Procedure Laterality Date   ANGIOPLASTY     2015   COLONOSCOPY     multiple   EYE SURGERY     PARS PLANA VITRECTOMY Left 03/15/2020   Procedure: PARS PLANA VITRECTOMY WITH 25 GAUGE;  Surgeon: Jarold Mayo, MD;  Location: [facility] OR;  Service: Ophthalmology;  Laterality: Left;   PHOTOCOAGULATION WITH LASER Left 03/15/2020   Procedure: PHOTOCOAGULATION WITH LASER; INTRAVITREAL INJECTION OF AVASTIN ;  Surgeon: Jarold Mayo, MD;  Location: [facility] OR;  Service: Ophthalmology;  Laterality: Left;   REFRACTIVE SURGERY     UPPER GI ENDOSCOPY     several   VITRECTOMY     Patient Active Problem List   Diagnosis Date Noted   Sleep-disordered breathing 12/31/2023    Actinic keratosis 11/30/2023   Vitamin D  deficiency 11/30/2023   Rhinitis 05/20/2023   Cervical spondylosis 08/26/2022   CKD (chronic kidney disease) stage 4, GFR 15-29 ml/min ([name]) 05/28/2021   Adjustment disorder 01/22/2021   Chronic back pain 10/22/2020   Peripheral vertigo 05/15/2020   Dermatitis 01/04/2020   Unintentional weight loss with loose stools 05/19/2019   Low vitamin B12 level 03/29/2019   Heme positive stool 03/14/2019   Normocytic anemia 03/14/2019   Chronic diastolic heart failure ([name]) 02/08/2019   Diabetic retinopathy ([name]) 09/08/2018   Physical debility 05/24/2018   Varicose veins of both lower extremities 03/01/2018   Fatigue due to exertion 10/14/2017   GERD (gastroesophageal reflux disease) 08/25/2017   CAD in native artery 04/14/2017   Hyperlipidemia associated with type 2 diabetes mellitus ([name]) 04/14/2017   Diabetic peripheral neuropathy associated with type 2 diabetes mellitus ([name]) 08/17/2016   [name] 05/02/2016   Hypertension associated with diabetes ([name]) 05/02/2016    PCP: Kennyth Worth HERO, MD   REFERRING PROVIDER: Kennyth Worth HERO, MD   REFERRING DIAG: R53.81 (ICD-10-CM) - Physical debility   THERAPY DIAG:  Muscle weakness (generalized)  Other abnormalities of gait and mobility  Difficulty  in walking, not elsewhere classified  Rationale for Evaluation and Treatment: Rehabilitation  ONSET DATE: 2022  SUBJECTIVE:   SUBJECTIVE STATEMENT: Patient reports he had his consult with the neurologist (an earlier appt was offered due to a cancellation).  He reports the the doctor said his problems are the result of brain atrophy caused by his diabetes and multiple medical problems.  He said I have the brain of a 68 year old. There is no fluid that needs to be drained.  He could have a spinal tap procedure but the patient states he doesn't want to go through that. He was told he has neuropathy.     Arrives via  transport chair assisted by [name]  Brother: [name] identical twins  PERTINENT HISTORY: 3/11: evidence of lumbar compression deformity Compression fx L1 2022, CAD, CKD (stage 4), DM, cardiac arrest 2017, neuropathy, retinopathy Long history of gait abnormality beginning back in 2022. He has a history of CAD and believes that his debility began following a covid infection. In December odf 2022 he fell and suffered a compression fracture of L1.  PAIN:  Are you having pain? no  PRECAUTIONS: Fall  RED FLAGS: None   WEIGHT BEARING RESTRICTIONS: No  FALLS:  Has patient fallen in last 6 months? No and with sit to stand falls back sometimes  LIVING ENVIRONMENT: Lives with: lives with their family Lives in: House/apartment Stairs: No Has following equipment at home: Single point cane, Environmental consultant - 2 wheeled, Environmental consultant - 4 wheeled, shower chair, Grab bars, and stand up walker also; also has shower chair at sink for shaving, brushing teeth etc, also has transporter.  OCCUPATION: retired  PLOF: Independent with household mobility with device  PATIENT GOALS: to walk without a walker  NEXT MD VISIT: 09/28/22  OBJECTIVE:  Note: Objective measures were completed at Evaluation unless otherwise noted.  DIAGNOSTIC FINDINGS: N/A  COGNITION: Overall cognitive status: Within functional limits for tasks assessed     SENSATION: Neuropathy in B feet   MUSCLE LENGTH: Marked HS R>L, hip flexor tightness   POSTURE: rounded shoulders and forward head  LOWER EXTREMITY ROM: WFL for tasks assessed  LOWER EXTREMITY MMT: *tested in hooklying  MMT Right eval Left eval 4/1  5/27  Hip flexion 4 4 4  Bil 4 Bil  Hip extension Able to bridge Able to bridge 4- Bil 4- Bil  Hip abduction 4-* 4-* 4- Bil 4- Bil  Hip adduction      Hip internal rotation      Hip external rotation      Knee flexion      Knee extension 4 4+ 4- Bil 4- Bil  Ankle dorsiflexion 4+ 4- 4- Bil 4- Bil  Ankle plantarflexion       Ankle inversion      Ankle eversion       (Blank rows = not tested)  3/20:  max UE assist to rise sit to stand; LE hip extension, knee extension grossly 3+ to 4-/5  FUNCTIONAL TESTS:  30 seconds chair stand test Timed up and go (TUG): 59.36 with 4WW Stance time: 45 sec patient reports unsteadiness and feels he goes backwards 07/27/2023 92 ft decreased cadence; decreased step length; absent heel strike  12/26: 5x STS: unable to rise without heavy UE use; attempted to use armrests for test but does not come all the way up;  next trial with chair +cushion to touch at least one RW handle 48.2 sec:   TUG: cushion in seat +arm use  and RW:  52.49 sec  08/13/23 attempted 5x STS but pt unable to complete: 1st rep does not come all the way up and then plops heavily into the transport wheelchair (he then recounts how this happened at home and how the chair tipped backwards) unable to complete reps Unable to complete TUG today, he does not have his 4 wheeled walker today and feels he is slower with the clinic RW  09/15/2023 TUG: 1:03 with rollator 5STS:48.55 sec heavy reliance on UE support; last repetition was a challenge  3/20: Heavy UE reliance with STS (unable to push up from [facility] armrests, pulls up from // bars) 4/1: max UE assist with sit to stand  5/27:  5x STS no cushion: able to complete 4 reps 47.19 sec            5x STS with cushion 22.5 seat height 5x 43.09 sec able to push up from [facility] armrests rather than // bars (much less reliance on Ues to rise)  7/24: 5x STS with cushion 53.30 needs UE assist from [facility], 2nd trial 32.22 push from [facility] armrests  GAIT: Distance walked: 25 Assistive device utilized: Walker - 4 wheeled Level of assistance: Modified independence Comments: slow speed, decreased step and stride, decreased heel strike, forward trunk lean.  TRANSFERS: Sit to stand: Uses walker to pull up; stand to sit: does not fully back up to chair and does not reach back; Supine to  sit: needs min A for log roll  (has some back pain with sit <>supine due to sitting upward from supine) also fear of falling from bed, sit to supine needs min A to lift legs on to mat table The Patient-Specific Functional Scale  Initial:  I am going to ask you to identify up to 3 important activities that you are unable to do or are having difficulty with as a result of this problem.  Today are there any activities that you are unable to do or having difficulty with because of this?  (Patient shown scale and patient rated each activity)  Follow up: When you first came in you had difficulty performing these activities.  Today do you still have difficulty?  Patient-Specific activity scoring scheme (Point to one number):  0 1 2 3 4 5 6 7 8 9  10 Unable                                                                                                          Able to perform To perform  activity at the same Activity         Level as before                                                                                                                       Injury or problem  Activity            Getting out of the recliner                                                                     Initial:       3                5/27:  4    7/24: 4-5 2.                   Walking with RW 40 feet                                                                Initial:           1                             7/24: 6-7 3.                          Standing to brush teeth                                                           Initial:       0 (has to do sitting on shower chair)     5/27: 3       7/24: 4-5 4. Putting on socks and shoes  while sitting                                                      initial 2-3              7/24:   5  TODAY'S TREATMENT:  03/16/24: Nu-Step L5 5 min UE/LE Sit to stand from [facility] + cushion  5x  pushing from armrests (more difficulty than usual rising all the way up) Gait training in // with gait belt x 4 laps with emphasis on longer step length and upright posture. Able to make one turn without  UE support Standing in // bars: pushing peanut ball back and forth for weight shifting, to challenge balance and increased LE weight bearing Standing in // bars pushing peanut ball down the bars 1 lap Attempted gait outside the // bars however the patient is unable to come to full standing from the [facility] Lat pulls 10# at dual cable (higher anchor point)  2 x 10  03/10/24: Nu-Step L3 5 min UE/LE Sit to stand from [facility] + cushion 5x pushing from armrests Gait without assistive device/outside // bars 35 feet with WC following close behind and close mod assist Gait training in // with gait belt x 4 laps with emphasis on longer step length and upright posture. Able to make one turn without  UE support UE reaching in multi directions; head turns, reaching with turning head, trunk rotation Biceps curl/row 10# at dual cable  2 x 10    03/08/24: Nu-Step L3 5 min UE/LE Seated triceps extension with 2# DB 2 x 10 Gait training in // with gait belt x 4 laps with emphasis on longer step length and upright posture. Patient attempted to turn with UE support he was able to complete 75% of turn before needing UE support Dual cable row column 10# 2 x 10 bilateral  Biceps curl at dual cable 10 2 x 10 Gait training in // with gait belt x 2 laps with emphasis on longer step length and upright posture.  Patient started worrying about the mass on his brain so he wanted to sit down.   03/03/24: 5x STS from foam cushion in W/C  2 trials PSFS Backwards walking in // bars 2 laps Sidestepping in // bars 4 laps Gait training in // with gait belt x 6 laps with emphasis on longer step length and toe clearance, arm swing Seated rocker board UE reaching in multi directions  Nu-Step L3 4 min UE/LE  02/23/24: Nu-Step L3  8 min UE/LE Bil arm row at cable column 10# 20 x UE reaching in multi directions  Standing in // bars with hip flexion to blue band at knee level (external cue) 5x 2 sets (2nd set with single arm support) Backwards walking in // bars 2 laps Stepping up and over blue band at ankle level 5x right/left Gait training in // with gait belt x 6 laps Seated blue band clams 12x   PATIENT EDUCATION:  Education details: PT eval findings, anticipated POC, initial HEP, postural awareness, and transfer safety  Person educated: Patient and brother Education method: Explanation, Demonstration, and Handouts Education comprehension: verbalized understanding and returned demonstration  HOME EXERCISE PROGRAM: Access Code: [email or web address] URL: [email or web address] Date: 09/15/2023 Prepared by: Kristeen Sar  Exercises - Sit to Stand with Counter Support  - 3 x daily - 3-4 x weekly - 1 sets - 1-5 reps - Hooklying Clamshell with Resistance  - 2 x daily - 3-4 x weekly - 1 sets - 10 reps - Seated Long Arc Quad  - 1 x daily - 7 x weekly - 1-3 sets - 10 reps - 5 sec hold - Standing Hip Abduction with Counter Support  - 1 x daily - 7 x weekly - 1-2 sets - 10 reps - Standing March with Counter Support  - 1 x daily - 7 x  weekly - 1-2 sets - 10 reps - Standing Heel Raise with Support  - 1 x daily - 7 x weekly - 1-2 sets - 10 reps - Shoulder External Rotation and Scapular Retraction with Resistance  - 1 x daily - 7 x weekly - 2 sets - 10 reps - Sit to Stand with Armchair  - 1 x daily - 7 x weekly - 1 sets - 5 reps  Patient Education - Tips to reduce freezing episodes with standing or walking   ASSESSMENT:  CLINICAL IMPRESSION: Patient less focused today as he is processing what he was told by the neurologist this morning.  He has more difficulty today with coming to full standing and advancing his feet during gait and requires heavier UE reliance.  Patient completed weight shifting in with verbal cues  to reduce wide base of support and improve midline control for safe standing and retrieval of items in the bathroom for grooming.       OBJECTIVE IMPAIRMENTS: Abnormal gait, decreased balance, decreased ROM, decreased strength, decreased safety awareness, dizziness, impaired flexibility, impaired sensation, postural dysfunction, and pain.   ACTIVITY LIMITATIONS: carrying, lifting, bending, standing, squatting, stairs, transfers, bed mobility, continence, bathing, and locomotion level  PARTICIPATION LIMITATIONS: shopping and community activity  PERSONAL FACTORS: Age, Fitness, Time since onset of injury/illness/exacerbation, and 3+ comorbidities: Compression fx L1 2022, CAD, CKD (stage 4), neuropathy, retinopathy are also affecting patient's functional outcome.   REHAB POTENTIAL: Good  CLINICAL DECISION MAKING: Unstable/unpredictable  EVALUATION COMPLEXITY: High   GOALS: Goals reviewed with patient? Yes  SHORT TERM GOALS: Target date: 07/30/23 Improved TUG to < 25 sec to decrease fall risk.  Baseline: Goal status: deferred  2.  Able to perform 5XSTS without UE assist and controlled descent showing improved LE strength.  Baseline:  Goal status: deferred 3.  Patient will be able to stand safely for 2 min without UE assist Baseline:  Goal status: met 5/27  LONG TERM GOALS: Target date: 04/28/2024  Ind with advanced HEP Baseline:  Goal status: IN PROGRESS   2.  Sit to stand from the recliner with PSFS score of 5 Baseline:  Goal status:  met 7/24  3.  Gait with RW from the bedroom to the sunroom 40-50 feet with PSFS of 3 Baseline:  Goal status: met 7/24  4. Improved LE mobility needed for greater ease with putting on socks and shoes in sitting with PSFS of 5 Baseline:  Goal status: met 7/24  5.  Patient will be able to stand long enough to brush teeth PSFS of 2 Baseline:  Goal status: met 5/27  6. Able to rise from 22 inch seat height push from the armrests to use the  new chairs in the living room (in July) Goal status: met 7/24  7. Patient will be able to stand for 12 minutes needed for grooming and dressing tasks at home    Goal status: ongoing                       8. The patient will have improved LE strength with 5x STS test with UE assist improved to 25 sec NEW                       9. The patient will be able to walk for 6 min with UE support with RW or // bars NEW  PLAN:  PT FREQUENCY: 2x week  PT DURATION: 8 weeks  PLANNED  INTERVENTIONS: 97164- PT Re-evaluation, 97110-Therapeutic exercises, 97530- Therapeutic activity, 97112- Neuromuscular re-education, [phone]- Self Care, [id number]- Manual therapy, [phone]- Gait training, [phone]- Aquatic Therapy, [phone]- Electrical stimulation (unattended), Patient/Family education, Balance training, Stair training, Taping, Joint mobilization, Spinal mobilization, DME instructions, Cryotherapy, and Moist heat  PLAN FOR NEXT SESSION:  Nu-Step; dual cable rows;  sit to stand from foam pad; static standing with teal power cord isometric trunk extension;  UE movements while standing for ADLs; gait in // bars working on standing erect, longer step lengths;  assess energy levels and endurance ; functional strengthening   Glade Pesa, PT 03/16/24 5:58 PM Phone: [phone] Fax: [phone]

## 2024-03-19 ENCOUNTER — Encounter: Payer: Self-pay | Admitting: Neurology

## 2024-03-21 ENCOUNTER — Encounter: Payer: Self-pay | Admitting: Physical Therapy

## 2024-03-21 ENCOUNTER — Ambulatory Visit: Admitting: Physical Therapy

## 2024-03-21 DIAGNOSIS — M6281 Muscle weakness (generalized): Secondary | ICD-10-CM | POA: Diagnosis not present

## 2024-03-21 DIAGNOSIS — R262 Difficulty in walking, not elsewhere classified: Secondary | ICD-10-CM

## 2024-03-21 DIAGNOSIS — R2689 Other abnormalities of gait and mobility: Secondary | ICD-10-CM

## 2024-03-21 NOTE — Telephone Encounter (Signed)
 Please advise lab result and Vitamin E is not been collected.

## 2024-03-21 NOTE — Therapy (Signed)
 OUTPATIENT PHYSICAL THERAPY LOWER EXTREMITY TREATMENT    Patient Name: Jeffrey [name] MRN: [id number] DOB:1955-12-13, 68 y.o., male Today's Date: 03/21/2024    END OF SESSION:  PT End of Session - 03/21/24 1626     Visit Number 46    Date for PT Re-Evaluation 04/28/24    Authorization Type HTA    Progress Note Due on Visit 50    PT Start Time 1533    PT Stop Time 1615    PT Time Calculation (min) 42 min    Equipment Utilized During Treatment Gait belt    Activity Tolerance Patient tolerated treatment well    Behavior During Therapy WFL for tasks assessed/performed                   Past Medical History:  Diagnosis Date   Cardiac arrest ([name]) 05/22/2016   Cataract    CHF (congestive heart failure) ([name])    CKD (chronic kidney disease) stage 3, GFR 30-59 ml/min ([name]) 05/28/2021   CKD (chronic kidney disease) stage 4, GFR 15-29 ml/min ([name]) 05/28/2021   Diabetes mellitus without complication ([name])    type 2   Diabetic retinopathy ([name])    Hyperlipidemia    Hypertension    Memory loss    mild   Myocardial infarct ([name]) 2105   Retinopathy    Both eyes   S/P primary angioplasty with coronary stent 05/22/2016   Sleep-disordered breathing 12/31/2023   Vitamin B12 deficiency    Vitreous hemorrhage of left eye ([name])    proliferative diabetic retinopathy   Past Surgical History:  Procedure Laterality Date   ANGIOPLASTY     2015   COLONOSCOPY     multiple   EYE SURGERY     PARS PLANA VITRECTOMY Left 03/15/2020   Procedure: PARS PLANA VITRECTOMY WITH 25 GAUGE;  Surgeon: Jarold Mayo, MD;  Location: [facility] OR;  Service: Ophthalmology;  Laterality: Left;   PHOTOCOAGULATION WITH LASER Left 03/15/2020   Procedure: PHOTOCOAGULATION WITH LASER; INTRAVITREAL INJECTION OF AVASTIN ;  Surgeon: Jarold Mayo, MD;  Location: [facility] OR;  Service: Ophthalmology;  Laterality: Left;   REFRACTIVE SURGERY     UPPER GI ENDOSCOPY     several   VITRECTOMY     Patient Active Problem List    Diagnosis Date Noted   Sleep-disordered breathing 12/31/2023   Actinic keratosis 11/30/2023   Vitamin D  deficiency 11/30/2023   Rhinitis 05/20/2023   Cervical spondylosis 08/26/2022   CKD (chronic kidney disease) stage 4, GFR 15-29 ml/min ([name]) 05/28/2021   Adjustment disorder 01/22/2021   Chronic back pain 10/22/2020   Peripheral vertigo 05/15/2020   Dermatitis 01/04/2020   Unintentional weight loss with loose stools 05/19/2019   Low vitamin B12 level 03/29/2019   Heme positive stool 03/14/2019   Normocytic anemia 03/14/2019   Chronic diastolic heart failure ([name]) 02/08/2019   Diabetic retinopathy ([name]) 09/08/2018   Physical debility 05/24/2018   Varicose veins of both lower extremities 03/01/2018   Fatigue due to exertion 10/14/2017   GERD (gastroesophageal reflux disease) 08/25/2017   CAD in native artery 04/14/2017   Hyperlipidemia associated with type 2 diabetes mellitus ([name]) 04/14/2017   Diabetic peripheral neuropathy associated with type 2 diabetes mellitus ([name]) 08/17/2016   [name] 05/02/2016   Hypertension associated with diabetes ([name]) 05/02/2016    PCP: Kennyth Worth HERO, MD   REFERRING PROVIDER: Kennyth Worth HERO, MD   REFERRING DIAG: R53.81 (ICD-10-CM) - Physical debility   THERAPY DIAG:  Other  abnormalities of gait and mobility  Difficulty in walking, not elsewhere classified  Muscle weakness (generalized)  Rationale for Evaluation and Treatment: Rehabilitation  ONSET DATE: 2022  SUBJECTIVE:   SUBJECTIVE STATEMENT: Patient reports he is okay today. He feels sluggish and stiff.    Arrives via transport chair assisted by [name]  Brother: [name] identical twins  PERTINENT HISTORY: 3/11: evidence of lumbar compression deformity Compression fx L1 2022, CAD, CKD (stage 4), DM, cardiac arrest 2017, neuropathy, retinopathy Long history of gait abnormality beginning back in 2022. He has a history of CAD and believes that  his debility began following a covid infection. In December odf 2022 he fell and suffered a compression fracture of L1.  PAIN:  Are you having pain? no  PRECAUTIONS: Fall  RED FLAGS: None   WEIGHT BEARING RESTRICTIONS: No  FALLS:  Has patient fallen in last 6 months? No and with sit to stand falls back sometimes  LIVING ENVIRONMENT: Lives with: lives with their family Lives in: House/apartment Stairs: No Has following equipment at home: Single point cane, Environmental consultant - 2 wheeled, Environmental consultant - 4 wheeled, shower chair, Grab bars, and stand up walker also; also has shower chair at sink for shaving, brushing teeth etc, also has transporter.  OCCUPATION: retired  PLOF: Independent with household mobility with device  PATIENT GOALS: to walk without a walker  NEXT MD VISIT: 09/28/22  OBJECTIVE:  Note: Objective measures were completed at Evaluation unless otherwise noted.  DIAGNOSTIC FINDINGS: N/A  COGNITION: Overall cognitive status: Within functional limits for tasks assessed     SENSATION: Neuropathy in B feet   MUSCLE LENGTH: Marked HS R>L, hip flexor tightness   POSTURE: rounded shoulders and forward head  LOWER EXTREMITY ROM: WFL for tasks assessed  LOWER EXTREMITY MMT: *tested in hooklying  MMT Right eval Left eval 4/1  5/27  Hip flexion 4 4 4  Bil 4 Bil  Hip extension Able to bridge Able to bridge 4- Bil 4- Bil  Hip abduction 4-* 4-* 4- Bil 4- Bil  Hip adduction      Hip internal rotation      Hip external rotation      Knee flexion      Knee extension 4 4+ 4- Bil 4- Bil  Ankle dorsiflexion 4+ 4- 4- Bil 4- Bil  Ankle plantarflexion      Ankle inversion      Ankle eversion       (Blank rows = not tested)  3/20:  max UE assist to rise sit to stand; LE hip extension, knee extension grossly 3+ to 4-/5  FUNCTIONAL TESTS:  30 seconds chair stand test Timed up and go (TUG): 59.36 with 4WW Stance time: 45 sec patient reports unsteadiness and feels he goes  backwards 07/27/2023 92 ft decreased cadence; decreased step length; absent heel strike  12/26: 5x STS: unable to rise without heavy UE use; attempted to use armrests for test but does not come all the way up;  next trial with chair +cushion to touch at least one RW handle 48.2 sec:   TUG: cushion in seat +arm use and RW:  52.49 sec  08/13/23 attempted 5x STS but pt unable to complete: 1st rep does not come all the way up and then plops heavily into the transport wheelchair (he then recounts how this happened at home and how the chair tipped backwards) unable to complete reps Unable to complete TUG today, he does not have his 4 wheeled walker today and feels  he is slower with the clinic RW  09/15/2023 TUG: 1:03 with rollator 5STS:48.55 sec heavy reliance on UE support; last repetition was a challenge  3/20: Heavy UE reliance with STS (unable to push up from [facility] armrests, pulls up from // bars) 4/1: max UE assist with sit to stand  5/27:  5x STS no cushion: able to complete 4 reps 47.19 sec            5x STS with cushion 22.5 seat height 5x 43.09 sec able to push up from [facility] armrests rather than // bars (much less reliance on Ues to rise)  7/24: 5x STS with cushion 53.30 needs UE assist from [facility], 2nd trial 32.22 push from [facility] armrests  GAIT: Distance walked: 25 Assistive device utilized: Walker - 4 wheeled Level of assistance: Modified independence Comments: slow speed, decreased step and stride, decreased heel strike, forward trunk lean.  TRANSFERS: Sit to stand: Uses walker to pull up; stand to sit: does not fully back up to chair and does not reach back; Supine to sit: needs min A for log roll  (has some back pain with sit <>supine due to sitting upward from supine) also fear of falling from bed, sit to supine needs min A to lift legs on to mat table The Patient-Specific Functional Scale  Initial:  I am going to ask you to identify up to 3 important activities that you are unable to  do or are having difficulty with as a result of this problem.  Today are there any activities that you are unable to do or having difficulty with because of this?  (Patient shown scale and patient rated each activity)  Follow up: When you first came in you had difficulty performing these activities.  Today do you still have difficulty?  Patient-Specific activity scoring scheme (Point to one number):  0 1 2 3 4 5 6 7 8 9  10 Unable                                                                                                          Able to perform To perform                                                                                                    activity at the same Activity         Level as before  Injury or problem  Activity            Getting out of the recliner                                                                     Initial:       3                5/27:  4    7/24: 4-5 2.                   Walking with RW 40 feet                                                                Initial:           1                             7/24: 6-7 3.                          Standing to brush teeth                                                           Initial:       0 (has to do sitting on shower chair)     5/27: 3       7/24: 4-5 4. Putting on socks and shoes  while sitting                                                      initial 2-3              7/24:   5  TODAY'S TREATMENT:  03/21/24: Nu-Step L5 5 min UE/LE Gait training in // with gait belt x 5 laps with emphasis on longer step length and upright posture. (Used UE support throughout) Flat cone tap with 4 cones (PT verbalizing colors) in // bars x 1 min 30 sec Seated shoulder press 2# DB x 10 Seated chest press 2# DB x 10 Seated biceps curl 2# DB x 10 Seated row 10# x 10 bilateral  Lat pulls 10# at  dual cable (higher anchor point)  2 x 10    03/16/24: Nu-Step L5 5 min UE/LE Sit to stand from [facility] + cushion 5x pushing from armrests (more difficulty than usual rising all the way up) Gait training in // with gait belt x 4 laps with emphasis on longer step length and upright posture. Able to make one turn without  UE support Standing in // bars:  pushing peanut ball back and forth for weight shifting, to challenge balance and increased LE weight bearing Standing in // bars pushing peanut ball down the bars 1 lap Attempted gait outside the // bars however the patient is unable to come to full standing from the [facility] Lat pulls 10# at dual cable (higher anchor point)  2 x 10  03/10/24: Nu-Step L3 5 min UE/LE Sit to stand from [facility] + cushion 5x pushing from armrests Gait without assistive device/outside // bars 35 feet with WC following close behind and close mod assist Gait training in // with gait belt x 4 laps with emphasis on longer step length and upright posture. Able to make one turn without  UE support UE reaching in multi directions; head turns, reaching with turning head, trunk rotation Biceps curl/row 10# at dual cable  2 x 10    03/08/24: Nu-Step L3 5 min UE/LE Seated triceps extension with 2# DB 2 x 10 Gait training in // with gait belt x 4 laps with emphasis on longer step length and upright posture. Patient attempted to turn with UE support he was able to complete 75% of turn before needing UE support Dual cable row column 10# 2 x 10 bilateral  Biceps curl at dual cable 10 2 x 10 Gait training in // with gait belt x 2 laps with emphasis on longer step length and upright posture.  Patient started worrying about the mass on his brain so he wanted to sit down.     PATIENT EDUCATION:  Education details: PT eval findings, anticipated POC, initial HEP, postural awareness, and transfer safety  Person educated: Patient and brother Education method: Explanation, Demonstration, and  Handouts Education comprehension: verbalized understanding and returned demonstration  HOME EXERCISE PROGRAM: Access Code: [email or web address] URL: [email or web address] Date: 09/15/2023 Prepared by: Kristeen Sar  Exercises - Sit to Stand with Counter Support  - 3 x daily - 3-4 x weekly - 1 sets - 1-5 reps - Hooklying Clamshell with Resistance  - 2 x daily - 3-4 x weekly - 1 sets - 10 reps - Seated Long Arc Quad  - 1 x daily - 7 x weekly - 1-3 sets - 10 reps - 5 sec hold - Standing Hip Abduction with Counter Support  - 1 x daily - 7 x weekly - 1-2 sets - 10 reps - Standing March with Counter Support  - 1 x daily - 7 x weekly - 1-2 sets - 10 reps - Standing Heel Raise with Support  - 1 x daily - 7 x weekly - 1-2 sets - 10 reps - Shoulder External Rotation and Scapular Retraction with Resistance  - 1 x daily - 7 x weekly - 2 sets - 10 reps - Sit to Stand with Armchair  - 1 x daily - 7 x weekly - 1 sets - 5 reps  Patient Education - Tips to reduce freezing episodes with standing or walking   ASSESSMENT:  CLINICAL IMPRESSION: Patient reports he feels very sluggish today. My feet feel like they are cemented to the ground. During gait training today patient demonstrated poor upright posture while ambulating. PT provided tactile in verbal cues to encourage upright posture. Incorporated cognitive task today to encourage higher level processing. Overall, patient tolerated treatment session well. Required occasional standing rest breaks due to fatigue. Patient will benefit from skilled PT to address the below impairments and improve overall function.      OBJECTIVE IMPAIRMENTS: Abnormal gait, decreased balance, decreased ROM, decreased strength,  decreased safety awareness, dizziness, impaired flexibility, impaired sensation, postural dysfunction, and pain.   ACTIVITY LIMITATIONS: carrying, lifting, bending, standing, squatting, stairs, transfers, bed mobility, continence, bathing, and  locomotion level  PARTICIPATION LIMITATIONS: shopping and community activity  PERSONAL FACTORS: Age, Fitness, Time since onset of injury/illness/exacerbation, and 3+ comorbidities: Compression fx L1 2022, CAD, CKD (stage 4), neuropathy, retinopathy are also affecting patient's functional outcome.   REHAB POTENTIAL: Good  CLINICAL DECISION MAKING: Unstable/unpredictable  EVALUATION COMPLEXITY: High   GOALS: Goals reviewed with patient? Yes  SHORT TERM GOALS: Target date: 07/30/23 Improved TUG to < 25 sec to decrease fall risk.  Baseline: Goal status: deferred  2.  Able to perform 5XSTS without UE assist and controlled descent showing improved LE strength.  Baseline:  Goal status: deferred 3.  Patient will be able to stand safely for 2 min without UE assist Baseline:  Goal status: met 5/27  LONG TERM GOALS: Target date: 04/28/2024  Ind with advanced HEP Baseline:  Goal status: IN PROGRESS   2.  Sit to stand from the recliner with PSFS score of 5 Baseline:  Goal status:  met 7/24  3.  Gait with RW from the bedroom to the sunroom 40-50 feet with PSFS of 3 Baseline:  Goal status: met 7/24  4. Improved LE mobility needed for greater ease with putting on socks and shoes in sitting with PSFS of 5 Baseline:  Goal status: met 7/24  5.  Patient will be able to stand long enough to brush teeth PSFS of 2 Baseline:  Goal status: met 5/27  6. Able to rise from 22 inch seat height push from the armrests to use the new chairs in the living room (in July) Goal status: met 7/24  7. Patient will be able to stand for 12 minutes needed for grooming and dressing tasks at home    Goal status: ongoing                       8. The patient will have improved LE strength with 5x STS test with UE assist improved to 25 sec NEW                       9. The patient will be able to walk for 6 min with UE support with RW or // bars NEW  PLAN:  PT FREQUENCY: 2x week  PT DURATION: 8  weeks  PLANNED INTERVENTIONS: 97164- PT Re-evaluation, 97110-Therapeutic exercises, 97530- Therapeutic activity, 97112- Neuromuscular re-education, 97535- Self Care, [id number]- Manual therapy, 97116- Gait training, [phone]- Aquatic Therapy, 97014- Electrical stimulation (unattended), Patient/Family education, Balance training, Stair training, Taping, Joint mobilization, Spinal mobilization, DME instructions, Cryotherapy, and Moist heat  PLAN FOR NEXT SESSION:  Nu-Step; dual cable rows;  sit to stand from foam pad; static standing with teal power cord isometric trunk extension;  UE movements while standing for ADLs; gait in // bars working on standing erect, longer step lengths;  assess energy levels and endurance ; functional strengthening   Kristeen Sar, PT 03/21/24 4:30 PM

## 2024-03-21 NOTE — Therapy (Signed)
 OUTPATIENT PHYSICAL THERAPY LOWER EXTREMITY TREATMENT    Patient Name: Jeffrey [name] MRN: [id number] DOB:1955-12-13, 67 y.o., male Today's Date: 03/21/2024    END OF SESSION:  PT End of Session - 03/21/24 1626     Visit Number 46    Date for PT Re-Evaluation 04/28/24    Authorization Type HTA    Progress Note Due on Visit 50    PT Start Time 1533    PT Stop Time 1615    PT Time Calculation (min) 42 min    Equipment Utilized During Treatment Gait belt    Activity Tolerance Patient tolerated treatment well    Behavior During Therapy WFL for tasks assessed/performed                   Past Medical History:  Diagnosis Date   Cardiac arrest ([name]) 05/22/2016   Cataract    CHF (congestive heart failure) ([name])    CKD (chronic kidney disease) stage 3, GFR 30-59 ml/min ([name]) 05/28/2021   CKD (chronic kidney disease) stage 4, GFR 15-29 ml/min ([name]) 05/28/2021   Diabetes mellitus without complication ([name])    type 2   Diabetic retinopathy ([name])    Hyperlipidemia    Hypertension    Memory loss    mild   Myocardial infarct ([name]) 2105   Retinopathy    Both eyes   S/P primary angioplasty with coronary stent 05/22/2016   Sleep-disordered breathing 12/31/2023   Vitamin B12 deficiency    Vitreous hemorrhage of left eye ([name])    proliferative diabetic retinopathy   Past Surgical History:  Procedure Laterality Date   ANGIOPLASTY     2015   COLONOSCOPY     multiple   EYE SURGERY     PARS PLANA VITRECTOMY Left 03/15/2020   Procedure: PARS PLANA VITRECTOMY WITH 25 GAUGE;  Surgeon: Jarold Mayo, MD;  Location: [facility] OR;  Service: Ophthalmology;  Laterality: Left;   PHOTOCOAGULATION WITH LASER Left 03/15/2020   Procedure: PHOTOCOAGULATION WITH LASER; INTRAVITREAL INJECTION OF AVASTIN ;  Surgeon: Jarold Mayo, MD;  Location: [facility] OR;  Service: Ophthalmology;  Laterality: Left;   REFRACTIVE SURGERY     UPPER GI ENDOSCOPY     several   VITRECTOMY     Patient Active Problem List    Diagnosis Date Noted   Sleep-disordered breathing 12/31/2023   Actinic keratosis 11/30/2023   Vitamin D  deficiency 11/30/2023   Rhinitis 05/20/2023   Cervical spondylosis 08/26/2022   CKD (chronic kidney disease) stage 4, GFR 15-29 ml/min ([name]) 05/28/2021   Adjustment disorder 01/22/2021   Chronic back pain 10/22/2020   Peripheral vertigo 05/15/2020   Dermatitis 01/04/2020   Unintentional weight loss with loose stools 05/19/2019   Low vitamin B12 level 03/29/2019   Heme positive stool 03/14/2019   Normocytic anemia 03/14/2019   Chronic diastolic heart failure ([name]) 02/08/2019   Diabetic retinopathy ([name]) 09/08/2018   Physical debility 05/24/2018   Varicose veins of both lower extremities 03/01/2018   Fatigue due to exertion 10/14/2017   GERD (gastroesophageal reflux disease) 08/25/2017   CAD in native artery 04/14/2017   Hyperlipidemia associated with type 2 diabetes mellitus ([name]) 04/14/2017   Diabetic peripheral neuropathy associated with type 2 diabetes mellitus ([name]) 08/17/2016   [name] 05/02/2016   Hypertension associated with diabetes ([name]) 05/02/2016    PCP: Kennyth Worth HERO, MD   REFERRING PROVIDER: Kennyth Worth HERO, MD   REFERRING DIAG: R53.81 (ICD-10-CM) - Physical debility   THERAPY DIAG:  Other  abnormalities of gait and mobility  Difficulty in walking, not elsewhere classified  Muscle weakness (generalized)  Rationale for Evaluation and Treatment: Rehabilitation  ONSET DATE: 2022  SUBJECTIVE:   SUBJECTIVE STATEMENT: Patient reports he is okay today. He feels sluggish and stiff.    Arrives via transport chair assisted by [name]  Brother: [name] identical twins  PERTINENT HISTORY: 3/11: evidence of lumbar compression deformity Compression fx L1 2022, CAD, CKD (stage 4), DM, cardiac arrest 2017, neuropathy, retinopathy Long history of gait abnormality beginning back in 2022. He has a history of CAD and believes that  his debility began following a covid infection. In December odf 2022 he fell and suffered a compression fracture of L1.  PAIN:  Are you having pain? no  PRECAUTIONS: Fall  RED FLAGS: None   WEIGHT BEARING RESTRICTIONS: No  FALLS:  Has patient fallen in last 6 months? No and with sit to stand falls back sometimes  LIVING ENVIRONMENT: Lives with: lives with their family Lives in: House/apartment Stairs: No Has following equipment at home: Single point cane, Environmental consultant - 2 wheeled, Environmental consultant - 4 wheeled, shower chair, Grab bars, and stand up walker also; also has shower chair at sink for shaving, brushing teeth etc, also has transporter.  OCCUPATION: retired  PLOF: Independent with household mobility with device  PATIENT GOALS: to walk without a walker  NEXT MD VISIT: 09/28/22  OBJECTIVE:  Note: Objective measures were completed at Evaluation unless otherwise noted.  DIAGNOSTIC FINDINGS: N/A  COGNITION: Overall cognitive status: Within functional limits for tasks assessed     SENSATION: Neuropathy in B feet   MUSCLE LENGTH: Marked HS R>L, hip flexor tightness   POSTURE: rounded shoulders and forward head  LOWER EXTREMITY ROM: WFL for tasks assessed  LOWER EXTREMITY MMT: *tested in hooklying  MMT Right eval Left eval 4/1  5/27  Hip flexion 4 4 4  Bil 4 Bil  Hip extension Able to bridge Able to bridge 4- Bil 4- Bil  Hip abduction 4-* 4-* 4- Bil 4- Bil  Hip adduction      Hip internal rotation      Hip external rotation      Knee flexion      Knee extension 4 4+ 4- Bil 4- Bil  Ankle dorsiflexion 4+ 4- 4- Bil 4- Bil  Ankle plantarflexion      Ankle inversion      Ankle eversion       (Blank rows = not tested)  3/20:  max UE assist to rise sit to stand; LE hip extension, knee extension grossly 3+ to 4-/5  FUNCTIONAL TESTS:  30 seconds chair stand test Timed up and go (TUG): 59.36 with 4WW Stance time: 45 sec patient reports unsteadiness and feels he goes  backwards 07/27/2023 92 ft decreased cadence; decreased step length; absent heel strike  12/26: 5x STS: unable to rise without heavy UE use; attempted to use armrests for test but does not come all the way up;  next trial with chair +cushion to touch at least one RW handle 48.2 sec:   TUG: cushion in seat +arm use and RW:  52.49 sec  08/13/23 attempted 5x STS but pt unable to complete: 1st rep does not come all the way up and then plops heavily into the transport wheelchair (he then recounts how this happened at home and how the chair tipped backwards) unable to complete reps Unable to complete TUG today, he does not have his 4 wheeled walker today and feels  he is slower with the clinic RW  09/15/2023 TUG: 1:03 with rollator 5STS:48.55 sec heavy reliance on UE support; last repetition was a challenge  3/20: Heavy UE reliance with STS (unable to push up from [facility] armrests, pulls up from // bars) 4/1: max UE assist with sit to stand  5/27:  5x STS no cushion: able to complete 4 reps 47.19 sec            5x STS with cushion 22.5 seat height 5x 43.09 sec able to push up from [facility] armrests rather than // bars (much less reliance on Ues to rise)  7/24: 5x STS with cushion 53.30 needs UE assist from [facility], 2nd trial 32.22 push from [facility] armrests  GAIT: Distance walked: 25 Assistive device utilized: Walker - 4 wheeled Level of assistance: Modified independence Comments: slow speed, decreased step and stride, decreased heel strike, forward trunk lean.  TRANSFERS: Sit to stand: Uses walker to pull up; stand to sit: does not fully back up to chair and does not reach back; Supine to sit: needs min A for log roll  (has some back pain with sit <>supine due to sitting upward from supine) also fear of falling from bed, sit to supine needs min A to lift legs on to mat table The Patient-Specific Functional Scale  Initial:  I am going to ask you to identify up to 3 important activities that you are unable to  do or are having difficulty with as a result of this problem.  Today are there any activities that you are unable to do or having difficulty with because of this?  (Patient shown scale and patient rated each activity)  Follow up: When you first came in you had difficulty performing these activities.  Today do you still have difficulty?  Patient-Specific activity scoring scheme (Point to one number):  0 1 2 3 4 5 6 7 8 9  10 Unable                                                                                                          Able to perform To perform                                                                                                    activity at the same Activity         Level as before  Injury or problem  Activity            Getting out of the recliner                                                                     Initial:       3                5/27:  4    7/24: 4-5 2.                   Walking with RW 40 feet                                                                Initial:           1                             7/24: 6-7 3.                          Standing to brush teeth                                                           Initial:       0 (has to do sitting on shower chair)     5/27: 3       7/24: 4-5 4. Putting on socks and shoes  while sitting                                                      initial 2-3              7/24:   5  TODAY'S TREATMENT:  03/21/24: Nu-Step L5 5 min UE/LE Gait training in // with gait belt x 5 laps with emphasis on longer step length and upright posture. (Used UE support throughout) Flat cone tap with 4 cones (PT verbalizing colors) in // bars x 1 min 30 sec Seated shoulder press 2# DB x 10 Seated chest press 2# DB x 10 Seated biceps curl 2# DB x 10 Seated row 10# x 10 bilateral  Lat pulls 10# at  dual cable (higher anchor point)  2 x 10    03/16/24: Nu-Step L5 5 min UE/LE Sit to stand from [facility] + cushion 5x pushing from armrests (more difficulty than usual rising all the way up) Gait training in // with gait belt x 4 laps with emphasis on longer step length and upright posture. Able to make one turn without  UE support Standing in // bars:  pushing peanut ball back and forth for weight shifting, to challenge balance and increased LE weight bearing Standing in // bars pushing peanut ball down the bars 1 lap Attempted gait outside the // bars however the patient is unable to come to full standing from the [facility] Lat pulls 10# at dual cable (higher anchor point)  2 x 10  03/10/24: Nu-Step L3 5 min UE/LE Sit to stand from [facility] + cushion 5x pushing from armrests Gait without assistive device/outside // bars 35 feet with WC following close behind and close mod assist Gait training in // with gait belt x 4 laps with emphasis on longer step length and upright posture. Able to make one turn without  UE support UE reaching in multi directions; head turns, reaching with turning head, trunk rotation Biceps curl/row 10# at dual cable  2 x 10    03/08/24: Nu-Step L3 5 min UE/LE Seated triceps extension with 2# DB 2 x 10 Gait training in // with gait belt x 4 laps with emphasis on longer step length and upright posture. Patient attempted to turn with UE support he was able to complete 75% of turn before needing UE support Dual cable row column 10# 2 x 10 bilateral  Biceps curl at dual cable 10 2 x 10 Gait training in // with gait belt x 2 laps with emphasis on longer step length and upright posture.  Patient started worrying about the mass on his brain so he wanted to sit down.     PATIENT EDUCATION:  Education details: PT eval findings, anticipated POC, initial HEP, postural awareness, and transfer safety  Person educated: Patient and brother Education method: Explanation, Demonstration, and  Handouts Education comprehension: verbalized understanding and returned demonstration  HOME EXERCISE PROGRAM: Access Code: [email or web address] URL: [email or web address] Date: 09/15/2023 Prepared by: Kristeen Sar  Exercises - Sit to Stand with Counter Support  - 3 x daily - 3-4 x weekly - 1 sets - 1-5 reps - Hooklying Clamshell with Resistance  - 2 x daily - 3-4 x weekly - 1 sets - 10 reps - Seated Long Arc Quad  - 1 x daily - 7 x weekly - 1-3 sets - 10 reps - 5 sec hold - Standing Hip Abduction with Counter Support  - 1 x daily - 7 x weekly - 1-2 sets - 10 reps - Standing March with Counter Support  - 1 x daily - 7 x weekly - 1-2 sets - 10 reps - Standing Heel Raise with Support  - 1 x daily - 7 x weekly - 1-2 sets - 10 reps - Shoulder External Rotation and Scapular Retraction with Resistance  - 1 x daily - 7 x weekly - 2 sets - 10 reps - Sit to Stand with Armchair  - 1 x daily - 7 x weekly - 1 sets - 5 reps  Patient Education - Tips to reduce freezing episodes with standing or walking   ASSESSMENT:  CLINICAL IMPRESSION: Patient reports he feels very sluggish today. My feet feel like they are cemented to the ground. During gait training today patient demonstrated poor upright posture while ambulating. PT provided tactile in verbal cues to encourage upright posture. Incorporated cognitive task today to encourage higher level processing. Overall, patient tolerated treatment session well. Required occasional standing rest breaks due to fatigue. Patient will benefit from skilled PT to address the below impairments and improve overall function.      OBJECTIVE IMPAIRMENTS: Abnormal gait, decreased balance, decreased ROM, decreased strength,  decreased safety awareness, dizziness, impaired flexibility, impaired sensation, postural dysfunction, and pain.   ACTIVITY LIMITATIONS: carrying, lifting, bending, standing, squatting, stairs, transfers, bed mobility, continence, bathing, and  locomotion level  PARTICIPATION LIMITATIONS: shopping and community activity  PERSONAL FACTORS: Age, Fitness, Time since onset of injury/illness/exacerbation, and 3+ comorbidities: Compression fx L1 2022, CAD, CKD (stage 4), neuropathy, retinopathy are also affecting patient's functional outcome.   REHAB POTENTIAL: Good  CLINICAL DECISION MAKING: Unstable/unpredictable  EVALUATION COMPLEXITY: High   GOALS: Goals reviewed with patient? Yes  SHORT TERM GOALS: Target date: 07/30/23 Improved TUG to < 25 sec to decrease fall risk.  Baseline: Goal status: deferred  2.  Able to perform 5XSTS without UE assist and controlled descent showing improved LE strength.  Baseline:  Goal status: deferred 3.  Patient will be able to stand safely for 2 min without UE assist Baseline:  Goal status: met 5/27  LONG TERM GOALS: Target date: 04/28/2024  Ind with advanced HEP Baseline:  Goal status: IN PROGRESS   2.  Sit to stand from the recliner with PSFS score of 5 Baseline:  Goal status:  met 7/24  3.  Gait with RW from the bedroom to the sunroom 40-50 feet with PSFS of 3 Baseline:  Goal status: met 7/24  4. Improved LE mobility needed for greater ease with putting on socks and shoes in sitting with PSFS of 5 Baseline:  Goal status: met 7/24  5.  Patient will be able to stand long enough to brush teeth PSFS of 2 Baseline:  Goal status: met 5/27  6. Able to rise from 22 inch seat height push from the armrests to use the new chairs in the living room (in July) Goal status: met 7/24  7. Patient will be able to stand for 12 minutes needed for grooming and dressing tasks at home    Goal status: ongoing                       8. The patient will have improved LE strength with 5x STS test with UE assist improved to 25 sec NEW                       9. The patient will be able to walk for 6 min with UE support with RW or // bars NEW  PLAN:  PT FREQUENCY: 2x week  PT DURATION: 8  weeks  PLANNED INTERVENTIONS: 97164- PT Re-evaluation, 97110-Therapeutic exercises, 97530- Therapeutic activity, 97112- Neuromuscular re-education, 97535- Self Care, [id number]- Manual therapy, 97116- Gait training, [phone]- Aquatic Therapy, 97014- Electrical stimulation (unattended), Patient/Family education, Balance training, Stair training, Taping, Joint mobilization, Spinal mobilization, DME instructions, Cryotherapy, and Moist heat  PLAN FOR NEXT SESSION:  Nu-Step; dual cable rows;  sit to stand from foam pad; static standing with teal power cord isometric trunk extension;  UE movements while standing for ADLs; gait in // bars working on standing erect, longer step lengths;  assess energy levels and endurance ; functional strengthening   Kristeen Sar, PT 03/21/24 4:30 PM

## 2024-03-22 ENCOUNTER — Ambulatory Visit: Payer: Self-pay | Admitting: Neurology

## 2024-03-22 LAB — IMMUNOFIXATION ELECTROPHORESIS
IgG (Immunoglobin G), Serum: 1296 mg/dL (ref 600–1540)
IgM, Serum: 27 mg/dL — ABNORMAL LOW (ref 50–300)
Immunoglobulin A: 232 mg/dL (ref 70–320)

## 2024-03-22 LAB — COPPER, SERUM: Copper: 90 ug/dL (ref 70–175)

## 2024-03-23 ENCOUNTER — Encounter: Payer: Self-pay | Admitting: Neurology

## 2024-03-23 ENCOUNTER — Ambulatory Visit: Admitting: Physical Therapy

## 2024-03-24 ENCOUNTER — Encounter: Payer: Self-pay | Admitting: Neurology

## 2024-03-24 ENCOUNTER — Other Ambulatory Visit: Payer: Self-pay

## 2024-03-24 DIAGNOSIS — G9389 Other specified disorders of brain: Secondary | ICD-10-CM

## 2024-03-24 DIAGNOSIS — R269 Unspecified abnormalities of gait and mobility: Secondary | ICD-10-CM

## 2024-03-24 DIAGNOSIS — R4189 Other symptoms and signs involving cognitive functions and awareness: Secondary | ICD-10-CM

## 2024-03-28 ENCOUNTER — Telehealth: Payer: Self-pay

## 2024-03-28 DIAGNOSIS — G9389 Other specified disorders of brain: Secondary | ICD-10-CM

## 2024-03-28 DIAGNOSIS — R4189 Other symptoms and signs involving cognitive functions and awareness: Secondary | ICD-10-CM

## 2024-03-28 DIAGNOSIS — R269 Unspecified abnormalities of gait and mobility: Secondary | ICD-10-CM

## 2024-03-28 NOTE — Telephone Encounter (Signed)
 Called [employer] and There is no PA needed  Referral Call for in network is [phone]

## 2024-03-29 ENCOUNTER — Ambulatory Visit: Admitting: Physical Therapy

## 2024-03-29 DIAGNOSIS — R2689 Other abnormalities of gait and mobility: Secondary | ICD-10-CM

## 2024-03-29 DIAGNOSIS — M6281 Muscle weakness (generalized): Secondary | ICD-10-CM | POA: Diagnosis not present

## 2024-03-29 DIAGNOSIS — R262 Difficulty in walking, not elsewhere classified: Secondary | ICD-10-CM

## 2024-03-29 NOTE — Therapy (Signed)
 OUTPATIENT PHYSICAL THERAPY LOWER EXTREMITY TREATMENT    Patient Name: Jeffrey [name] MRN: [id number] DOB:1955/12/27, 68 y.o., male Today's Date: 03/29/2024    END OF SESSION:  PT End of Session - 03/29/24 1442     Visit Number 47    Date for PT Re-Evaluation 04/28/24    Authorization Type HTA    Progress Note Due on Visit 50    PT Start Time 1443    PT Stop Time 1525    PT Time Calculation (min) 42 min    Equipment Utilized During Treatment Gait belt    Activity Tolerance Patient tolerated treatment well                   Past Medical History:  Diagnosis Date   Cardiac arrest ([name]) 05/22/2016   Cataract    CHF (congestive heart failure) ([name])    CKD (chronic kidney disease) stage 3, GFR 30-59 ml/min ([name]) 05/28/2021   CKD (chronic kidney disease) stage 4, GFR 15-29 ml/min ([name]) 05/28/2021   Diabetes mellitus without complication ([name])    type 2   Diabetic retinopathy ([name])    Hyperlipidemia    Hypertension    Memory loss    mild   Myocardial infarct ([name]) 2105   Retinopathy    Both eyes   S/P primary angioplasty with coronary stent 05/22/2016   Sleep-disordered breathing 12/31/2023   Vitamin B12 deficiency    Vitreous hemorrhage of left eye ([name])    proliferative diabetic retinopathy   Past Surgical History:  Procedure Laterality Date   ANGIOPLASTY     2015   COLONOSCOPY     multiple   EYE SURGERY     PARS PLANA VITRECTOMY Left 03/15/2020   Procedure: PARS PLANA VITRECTOMY WITH 25 GAUGE;  Surgeon: Jarold Mayo, MD;  Location: [facility] OR;  Service: Ophthalmology;  Laterality: Left;   PHOTOCOAGULATION WITH LASER Left 03/15/2020   Procedure: PHOTOCOAGULATION WITH LASER; INTRAVITREAL INJECTION OF AVASTIN ;  Surgeon: Jarold Mayo, MD;  Location: [facility] OR;  Service: Ophthalmology;  Laterality: Left;   REFRACTIVE SURGERY     UPPER GI ENDOSCOPY     several   VITRECTOMY     Patient Active Problem List   Diagnosis Date Noted   Sleep-disordered breathing  12/31/2023   Actinic keratosis 11/30/2023   Vitamin D  deficiency 11/30/2023   Rhinitis 05/20/2023   Cervical spondylosis 08/26/2022   CKD (chronic kidney disease) stage 4, GFR 15-29 ml/min ([name]) 05/28/2021   Adjustment disorder 01/22/2021   Chronic back pain 10/22/2020   Peripheral vertigo 05/15/2020   Dermatitis 01/04/2020   Unintentional weight loss with loose stools 05/19/2019   Low vitamin B12 level 03/29/2019   Heme positive stool 03/14/2019   Normocytic anemia 03/14/2019   Chronic diastolic heart failure ([name]) 02/08/2019   Diabetic retinopathy ([name]) 09/08/2018   Physical debility 05/24/2018   Varicose veins of both lower extremities 03/01/2018   Fatigue due to exertion 10/14/2017   GERD (gastroesophageal reflux disease) 08/25/2017   CAD in native artery 04/14/2017   Hyperlipidemia associated with type 2 diabetes mellitus ([name]) 04/14/2017   Diabetic peripheral neuropathy associated with type 2 diabetes mellitus ([name]) 08/17/2016   [name] 05/02/2016   Hypertension associated with diabetes ([name]) 05/02/2016    PCP: Kennyth Worth HERO, MD   REFERRING PROVIDER: Kennyth Worth HERO, MD   REFERRING DIAG: R53.81 (ICD-10-CM) - Physical debility   THERAPY DIAG:  Other abnormalities of gait and mobility  Difficulty in walking, not  elsewhere classified  Muscle weakness (generalized)  Rationale for Evaluation and Treatment: Rehabilitation  ONSET DATE: 2022  SUBJECTIVE:   SUBJECTIVE STATEMENT: The neurologist says I need to keep doing therapy b/c I'm getting weaker.  I'm afraid of being bed bound. I wish there was a magic pill.  He (the doctor) says it's atrophy of the brain not fluid on the brain.  My back and knees bother me at night so I take Extra Strength Tylenol  so I can get some rest.     Arrives via transport chair assisted by [name]  Brother: [name] identical twins  PERTINENT HISTORY: 3/11: evidence of lumbar compression  deformity Compression fx L1 2022, CAD, CKD (stage 4), DM, cardiac arrest 2017, neuropathy, retinopathy Long history of gait abnormality beginning back in 2022. He has a history of CAD and believes that his debility began following a covid infection. In December odf 2022 he fell and suffered a compression fracture of L1.  PAIN:  Are you having pain? no  PRECAUTIONS: Fall  RED FLAGS: None   WEIGHT BEARING RESTRICTIONS: No  FALLS:  Has patient fallen in last 6 months? No and with sit to stand falls back sometimes  LIVING ENVIRONMENT: Lives with: lives with their family Lives in: House/apartment Stairs: No Has following equipment at home: Single point cane, Environmental consultant - 2 wheeled, Environmental consultant - 4 wheeled, shower chair, Grab bars, and stand up walker also; also has shower chair at sink for shaving, brushing teeth etc, also has transporter.  OCCUPATION: retired  PLOF: Independent with household mobility with device  PATIENT GOALS: to walk without a walker  NEXT MD VISIT: 09/28/22  OBJECTIVE:  Note: Objective measures were completed at Evaluation unless otherwise noted.  DIAGNOSTIC FINDINGS: N/A  COGNITION: Overall cognitive status: Within functional limits for tasks assessed     SENSATION: Neuropathy in B feet   MUSCLE LENGTH: Marked HS R>L, hip flexor tightness   POSTURE: rounded shoulders and forward head  LOWER EXTREMITY ROM: WFL for tasks assessed  LOWER EXTREMITY MMT: *tested in hooklying  MMT Right eval Left eval 4/1  5/27  Hip flexion 4 4 4  Bil 4 Bil  Hip extension Able to bridge Able to bridge 4- Bil 4- Bil  Hip abduction 4-* 4-* 4- Bil 4- Bil  Hip adduction      Hip internal rotation      Hip external rotation      Knee flexion      Knee extension 4 4+ 4- Bil 4- Bil  Ankle dorsiflexion 4+ 4- 4- Bil 4- Bil  Ankle plantarflexion      Ankle inversion      Ankle eversion       (Blank rows = not tested)  3/20:  max UE assist to rise sit to stand; LE hip  extension, knee extension grossly 3+ to 4-/5  FUNCTIONAL TESTS:  30 seconds chair stand test Timed up and go (TUG): 59.36 with 4WW Stance time: 45 sec patient reports unsteadiness and feels he goes backwards 07/27/2023 92 ft decreased cadence; decreased step length; absent heel strike  12/26: 5x STS: unable to rise without heavy UE use; attempted to use armrests for test but does not come all the way up;  next trial with chair +cushion to touch at least one RW handle 48.2 sec:   TUG: cushion in seat +arm use and RW:  52.49 sec  08/13/23 attempted 5x STS but pt unable to complete: 1st rep does not come all the way  up and then plops heavily into the transport wheelchair (he then recounts how this happened at home and how the chair tipped backwards) unable to complete reps Unable to complete TUG today, he does not have his 4 wheeled walker today and feels he is slower with the clinic RW  09/15/2023 TUG: 1:03 with rollator 5STS:48.55 sec heavy reliance on UE support; last repetition was a challenge  3/20: Heavy UE reliance with STS (unable to push up from [facility] armrests, pulls up from // bars) 4/1: max UE assist with sit to stand  5/27:  5x STS no cushion: able to complete 4 reps 47.19 sec            5x STS with cushion 22.5 seat height 5x 43.09 sec able to push up from [facility] armrests rather than // bars (much less reliance on Ues to rise)  7/24: 5x STS with cushion 53.30 needs UE assist from [facility], 2nd trial 32.22 push from [facility] armrests  GAIT: Distance walked: 25 Assistive device utilized: Walker - 4 wheeled Level of assistance: Modified independence Comments: slow speed, decreased step and stride, decreased heel strike, forward trunk lean.  TRANSFERS: Sit to stand: Uses walker to pull up; stand to sit: does not fully back up to chair and does not reach back; Supine to sit: needs min A for log roll  (has some back pain with sit <>supine due to sitting upward from supine) also fear of falling  from bed, sit to supine needs min A to lift legs on to mat table The Patient-Specific Functional Scale  Initial:  I am going to ask you to identify up to 3 important activities that you are unable to do or are having difficulty with as a result of this problem.  Today are there any activities that you are unable to do or having difficulty with because of this?  (Patient shown scale and patient rated each activity)  Follow up: When you first came in you had difficulty performing these activities.  Today do you still have difficulty?  Patient-Specific activity scoring scheme (Point to one number):  0 1 2 3 4 5 6 7 8 9  10 Unable                                                                                                          Able to perform To perform                                                                                                    activity at the same Activity         Level as before  Injury or problem  Activity            Getting out of the recliner                                                                     Initial:       3                5/27:  4    7/24: 4-5 2.                   Walking with RW 40 feet                                                                Initial:           1                             7/24: 6-7 3.                          Standing to brush teeth                                                           Initial:       0 (has to do sitting on shower chair)     5/27: 3       7/24: 4-5 4. Putting on socks and shoes  while sitting                                                      initial 2-3              7/24:   5  TODAY'S TREATMENT:  03/29/24: Nu-Step L5 5 min UE/LE while discussing status  Dynamic balance: Bean bag toss 12x to circle 6 feet away (needs UE support) reports low back pain following (relieved with  sitting rest break) Static standing with UE reaches in multi planes, head turns Gait training in // with gait belt x 5 laps with emphasis on longer step length and upright posture; able to take 3 steps without UE support but unable to make a full lap in // bars Seated 3# LAQ 10x Seated 3# hip flexion 10x Seated 6# resting on knee: heel and toe raises 10x right/left Lat pulls 10# at dual cable (higher anchor point)  2 x 10  03/21/24: Nu-Step L5 5 min UE/LE Gait training in // with gait belt x 5 laps with emphasis on longer step length and upright posture. (Used UE support throughout) Flat cone tap with  4 cones (PT verbalizing colors) in // bars x 1 min 30 sec Seated shoulder press 2# DB x 10 Seated chest press 2# DB x 10 Seated biceps curl 2# DB x 10 Seated row 10# x 10 bilateral  Lat pulls 10# at dual cable (higher anchor point)  2 x 10    03/16/24: Nu-Step L5 5 min UE/LE Sit to stand from [facility] + cushion 5x pushing from armrests (more difficulty than usual rising all the way up) Gait training in // with gait belt x 4 laps with emphasis on longer step length and upright posture. Able to make one turn without  UE support Standing in // bars: pushing peanut ball back and forth for weight shifting, to challenge balance and increased LE weight bearing Standing in // bars pushing peanut ball down the bars 1 lap Attempted gait outside the // bars however the patient is unable to come to full standing from the [facility] Lat pulls 10# at dual cable (higher anchor point)  2 x 10  03/10/24: Nu-Step L3 5 min UE/LE Sit to stand from [facility] + cushion 5x pushing from armrests Gait without assistive device/outside // bars 35 feet with WC following close behind and close mod assist Gait training in // with gait belt x 4 laps with emphasis on longer step length and upright posture. Able to make one turn without  UE support UE reaching in multi directions; head turns, reaching with turning head, trunk rotation Biceps  curl/row 10# at dual cable  2 x 10     PATIENT EDUCATION:  Education details: PT eval findings, anticipated POC, initial HEP, postural awareness, and transfer safety  Person educated: Patient and brother Education method: Explanation, Demonstration, and Handouts Education comprehension: verbalized understanding and returned demonstration  HOME EXERCISE PROGRAM: Access Code: [email or web address] URL: [email or web address] Date: 09/15/2023 Prepared by: Kristeen Sar  Exercises - Sit to Stand with Counter Support  - 3 x daily - 3-4 x weekly - 1 sets - 1-5 reps - Hooklying Clamshell with Resistance  - 2 x daily - 3-4 x weekly - 1 sets - 10 reps - Seated Long Arc Quad  - 1 x daily - 7 x weekly - 1-3 sets - 10 reps - 5 sec hold - Standing Hip Abduction with Counter Support  - 1 x daily - 7 x weekly - 1-2 sets - 10 reps - Standing March with Counter Support  - 1 x daily - 7 x weekly - 1-2 sets - 10 reps - Standing Heel Raise with Support  - 1 x daily - 7 x weekly - 1-2 sets - 10 reps - Shoulder External Rotation and Scapular Retraction with Resistance  - 1 x daily - 7 x weekly - 2 sets - 10 reps - Sit to Stand with Armchair  - 1 x daily - 7 x weekly - 1 sets - 5 reps  Patient Education - Tips to reduce freezing episodes with standing or walking   ASSESSMENT:  CLINICAL IMPRESSION: Complains of back pain today with standing, relieved with sitting.  Frequent verbal cues for erect posture in standing and heavier reliance on Ues today.  He reports hand fatigue/pain from leaning on arms in standing.  Close CGA with gait belt for safety with dynamic balance challenges and turns with walking.  Therapist monitoring response to all interventions and modifying treatment accordingly.       OBJECTIVE IMPAIRMENTS: Abnormal gait, decreased balance, decreased ROM, decreased strength, decreased safety awareness, dizziness, impaired flexibility, impaired  sensation, postural dysfunction, and pain.    ACTIVITY LIMITATIONS: carrying, lifting, bending, standing, squatting, stairs, transfers, bed mobility, continence, bathing, and locomotion level  PARTICIPATION LIMITATIONS: shopping and community activity  PERSONAL FACTORS: Age, Fitness, Time since onset of injury/illness/exacerbation, and 3+ comorbidities: Compression fx L1 2022, CAD, CKD (stage 4), neuropathy, retinopathy are also affecting patient's functional outcome.   REHAB POTENTIAL: Good  CLINICAL DECISION MAKING: Unstable/unpredictable  EVALUATION COMPLEXITY: High   GOALS: Goals reviewed with patient? Yes  SHORT TERM GOALS: Target date: 07/30/23 Improved TUG to < 25 sec to decrease fall risk.  Baseline: Goal status: deferred  2.  Able to perform 5XSTS without UE assist and controlled descent showing improved LE strength.  Baseline:  Goal status: deferred 3.  Patient will be able to stand safely for 2 min without UE assist Baseline:  Goal status: met 5/27  LONG TERM GOALS: Target date: 04/28/2024  Ind with advanced HEP Baseline:  Goal status: IN PROGRESS   2.  Sit to stand from the recliner with PSFS score of 5 Baseline:  Goal status:  met 7/24  3.  Gait with RW from the bedroom to the sunroom 40-50 feet with PSFS of 3 Baseline:  Goal status: met 7/24  4. Improved LE mobility needed for greater ease with putting on socks and shoes in sitting with PSFS of 5 Baseline:  Goal status: met 7/24  5.  Patient will be able to stand long enough to brush teeth PSFS of 2 Baseline:  Goal status: met 5/27  6. Able to rise from 22 inch seat height push from the armrests to use the new chairs in the living room (in July) Goal status: met 7/24  7. Patient will be able to stand for 12 minutes needed for grooming and dressing tasks at home    Goal status: ongoing                       8. The patient will have improved LE strength with 5x STS test with UE assist improved to 25 sec NEW                       9. The  patient will be able to walk for 6 min with UE support with RW or // bars NEW  PLAN:  PT FREQUENCY: 2x week  PT DURATION: 8 weeks  PLANNED INTERVENTIONS: 97164- PT Re-evaluation, 97110-Therapeutic exercises, 97530- Therapeutic activity, 97112- Neuromuscular re-education, 97535- Self Care, [id number]- Manual therapy, 97116- Gait training, [phone]- Aquatic Therapy, 97014- Electrical stimulation (unattended), Patient/Family education, Balance training, Stair training, Taping, Joint mobilization, Spinal mobilization, DME instructions, Cryotherapy, and Moist heat  PLAN FOR NEXT SESSION:  Nu-Step; dual cable rows;  sit to stand from foam pad; static standing with teal power cord isometric trunk extension;  UE movements while standing for ADLs; gait in // bars working on standing erect, longer step lengths;  assess energy levels and endurance ; functional strengthening  Glade Pesa, PT 03/29/24 3:40 PM Phone: [phone] Fax: [phone]

## 2024-03-30 ENCOUNTER — Encounter: Payer: Self-pay | Admitting: Neurology

## 2024-03-31 ENCOUNTER — Ambulatory Visit: Admitting: Physical Therapy

## 2024-03-31 DIAGNOSIS — R2689 Other abnormalities of gait and mobility: Secondary | ICD-10-CM

## 2024-03-31 DIAGNOSIS — R262 Difficulty in walking, not elsewhere classified: Secondary | ICD-10-CM

## 2024-03-31 DIAGNOSIS — M6281 Muscle weakness (generalized): Secondary | ICD-10-CM | POA: Diagnosis not present

## 2024-03-31 NOTE — Therapy (Signed)
 OUTPATIENT PHYSICAL THERAPY LOWER EXTREMITY TREATMENT    Patient Name: Jeffrey [name] MRN: [id number] DOB:[birth year], 68 y.o., male Today's Date: 03/31/2024    END OF SESSION:  PT End of Session - 03/31/24 1441     Visit Number 48    Date for PT Re-Evaluation 04/28/24    Authorization Type HTA    Progress Note Due on Visit 50    PT Start Time 1443    PT Stop Time 1523    PT Time Calculation (min) 40 min    Equipment Utilized During Treatment Gait belt    Activity Tolerance Patient tolerated treatment well                   Past Medical History:  Diagnosis Date   Cardiac arrest ([name]) 05/22/2016   Cataract    CHF (congestive heart failure) ([name])    CKD (chronic kidney disease) stage 3, GFR 30-59 ml/min ([name]) 05/28/2021   CKD (chronic kidney disease) stage 4, GFR 15-29 ml/min ([name]) 05/28/2021   Diabetes mellitus without complication ([name])    type 2   Diabetic retinopathy ([name])    Hyperlipidemia    Hypertension    Memory loss    mild   Myocardial infarct ([name]) 2105   Retinopathy    Both eyes   S/P primary angioplasty with coronary stent 05/22/2016   Sleep-disordered breathing 12/31/2023   Vitamin B12 deficiency    Vitreous hemorrhage of left eye ([name])    proliferative diabetic retinopathy   Past Surgical History:  Procedure Laterality Date   ANGIOPLASTY     2015   COLONOSCOPY     multiple   EYE SURGERY     PARS PLANA VITRECTOMY Left 03/15/2020   Procedure: PARS PLANA VITRECTOMY WITH 25 GAUGE;  Surgeon: Jarold Mayo, MD;  Location: [facility] OR;  Service: Ophthalmology;  Laterality: Left;   PHOTOCOAGULATION WITH LASER Left 03/15/2020   Procedure: PHOTOCOAGULATION WITH LASER; INTRAVITREAL INJECTION OF AVASTIN ;  Surgeon: Jarold Mayo, MD;  Location: [facility] OR;  Service: Ophthalmology;  Laterality: Left;   REFRACTIVE SURGERY     UPPER GI ENDOSCOPY     several   VITRECTOMY     Patient Active Problem List   Diagnosis Date Noted   Sleep-disordered breathing  12/31/2023   Actinic keratosis 11/30/2023   Vitamin D  deficiency 11/30/2023   Rhinitis 05/20/2023   Cervical spondylosis 08/26/2022   CKD (chronic kidney disease) stage 4, GFR 15-29 ml/min ([name]) 05/28/2021   Adjustment disorder 01/22/2021   Chronic back pain 10/22/2020   Peripheral vertigo 05/15/2020   Dermatitis 01/04/2020   Unintentional weight loss with loose stools 05/19/2019   Low vitamin B12 level 03/29/2019   Heme positive stool 03/14/2019   Normocytic anemia 03/14/2019   Chronic diastolic heart failure ([name]) 02/08/2019   Diabetic retinopathy ([name]) 09/08/2018   Physical debility 05/24/2018   Varicose veins of both lower extremities 03/01/2018   Fatigue due to exertion 10/14/2017   GERD (gastroesophageal reflux disease) 08/25/2017   CAD in native artery 04/14/2017   Hyperlipidemia associated with type 2 diabetes mellitus ([name]) 04/14/2017   Diabetic peripheral neuropathy associated with type 2 diabetes mellitus ([name]) 08/17/2016   [name] 05/02/2016   Hypertension associated with diabetes ([name]) 05/02/2016    PCP: Kennyth Worth HERO, MD   REFERRING PROVIDER: Kennyth Worth HERO, MD   REFERRING DIAG: R53.81 (ICD-10-CM) - Physical debility   THERAPY DIAG:  Other abnormalities of gait and mobility  Difficulty in walking, not  elsewhere classified  Muscle weakness (generalized)  Rationale for Evaluation and Treatment: Rehabilitation  ONSET DATE: 2022  SUBJECTIVE:   SUBJECTIVE STATEMENT: I don't really remember but fatigue was not too bad after last session.   Arrives via transport chair assisted by [name]  Brother: [name] identical twins  PERTINENT HISTORY: 3/11: evidence of lumbar compression deformity Compression fx L1 2022, CAD, CKD (stage 4), DM, cardiac arrest 2017, neuropathy, retinopathy Long history of gait abnormality beginning back in 2022. He has a history of CAD and believes that his debility began following a covid  infection. In December odf 2022 he fell and suffered a compression fracture of L1.  PAIN:  Are you having pain? no  PRECAUTIONS: Fall  RED FLAGS: None   WEIGHT BEARING RESTRICTIONS: No  FALLS:  Has patient fallen in last 6 months? No and with sit to stand falls back sometimes  LIVING ENVIRONMENT: Lives with: lives with their family Lives in: House/apartment Stairs: No Has following equipment at home: Single point cane, Environmental consultant - 2 wheeled, Environmental consultant - 4 wheeled, shower chair, Grab bars, and stand up walker also; also has shower chair at sink for shaving, brushing teeth etc, also has transporter.  OCCUPATION: retired  PLOF: Independent with household mobility with device  PATIENT GOALS: to walk without a walker  NEXT MD VISIT: 09/28/22  OBJECTIVE:  Note: Objective measures were completed at Evaluation unless otherwise noted.  DIAGNOSTIC FINDINGS: N/A  COGNITION: Overall cognitive status: Within functional limits for tasks assessed     SENSATION: Neuropathy in B feet   MUSCLE LENGTH: Marked HS R>L, hip flexor tightness   POSTURE: rounded shoulders and forward head  LOWER EXTREMITY ROM: WFL for tasks assessed  LOWER EXTREMITY MMT: *tested in hooklying  MMT Right eval Left eval 4/1  5/27  Hip flexion 4 4 4  Bil 4 Bil  Hip extension Able to bridge Able to bridge 4- Bil 4- Bil  Hip abduction 4-* 4-* 4- Bil 4- Bil  Hip adduction      Hip internal rotation      Hip external rotation      Knee flexion      Knee extension 4 4+ 4- Bil 4- Bil  Ankle dorsiflexion 4+ 4- 4- Bil 4- Bil  Ankle plantarflexion      Ankle inversion      Ankle eversion       (Blank rows = not tested)  3/20:  max UE assist to rise sit to stand; LE hip extension, knee extension grossly 3+ to 4-/5  FUNCTIONAL TESTS:  30 seconds chair stand test Timed up and go (TUG): 59.36 with 4WW Stance time: 45 sec patient reports unsteadiness and feels he goes backwards 07/27/2023 92 ft decreased  cadence; decreased step length; absent heel strike  12/26: 5x STS: unable to rise without heavy UE use; attempted to use armrests for test but does not come all the way up;  next trial with chair +cushion to touch at least one RW handle 48.2 sec:   TUG: cushion in seat +arm use and RW:  52.49 sec  08/13/23 attempted 5x STS but pt unable to complete: 1st rep does not come all the way up and then plops heavily into the transport wheelchair (he then recounts how this happened at home and how the chair tipped backwards) unable to complete reps Unable to complete TUG today, he does not have his 4 wheeled walker today and feels he is slower with the clinic RW  09/15/2023  TUG: 1:03 with rollator 5STS:48.55 sec heavy reliance on UE support; last repetition was a challenge  3/20: Heavy UE reliance with STS (unable to push up from [facility] armrests, pulls up from // bars) 4/1: max UE assist with sit to stand  5/27:  5x STS no cushion: able to complete 4 reps 47.19 sec            5x STS with cushion 22.5 seat height 5x 43.09 sec able to push up from [facility] armrests rather than // bars (much less reliance on Ues to rise)  7/24: 5x STS with cushion 53.30 needs UE assist from [facility], 2nd trial 32.22 push from [facility] armrests  GAIT: Distance walked: 25 Assistive device utilized: Walker - 4 wheeled Level of assistance: Modified independence Comments: slow speed, decreased step and stride, decreased heel strike, forward trunk lean.  TRANSFERS: Sit to stand: Uses walker to pull up; stand to sit: does not fully back up to chair and does not reach back; Supine to sit: needs min A for log roll  (has some back pain with sit <>supine due to sitting upward from supine) also fear of falling from bed, sit to supine needs min A to lift legs on to mat table The Patient-Specific Functional Scale  Initial:  I am going to ask you to identify up to 3 important activities that you are unable to do or are having difficulty with as a result  of this problem.  Today are there any activities that you are unable to do or having difficulty with because of this?  (Patient shown scale and patient rated each activity)  Follow up: When you first came in you had difficulty performing these activities.  Today do you still have difficulty?  Patient-Specific activity scoring scheme (Point to one number):  0 1 2 3 4 5 6 7 8 9  10 Unable                                                                                                          Able to perform To perform                                                                                                    activity at the same Activity         Level as before  Injury or problem  Activity            Getting out of the recliner                                                                     Initial:       3                5/27:  4    7/24: 4-5 2.                   Walking with RW 40 feet                                                                Initial:           1                             7/24: 6-7 3.                          Standing to brush teeth                                                           Initial:       0 (has to do sitting on shower chair)     5/27: 3       7/24: 4-5 4. Putting on socks and shoes  while sitting                                                      initial 2-3              7/24:   5  TODAY'S TREATMENT:  03/31/24: Nu-Step L5 5 min UE/LE while discussing status (47 spm) increased speed Sit to stand 5x pushing from armrests (cues for less reliance on Ues)  Dynamic balance: Static standing with UE reaches in multi planes, head turns Dynamic balance: holding beach ball with reaching up, down and side to side 5x each Dynamic balance: circle toe taps 2 rounds of 6 Gait training in // with gait belt x 12  laps with emphasis on longer  step length and upright posture, 5-6 steps without UE use on railing; goal: few steps as possible --5 today! Usually 7 or more   03/29/24: Nu-Step L5 5 min UE/LE while discussing status  Dynamic balance: Bean bag toss 12x to circle 6 feet away (needs UE support) reports low back pain following (relieved with sitting rest break) Static standing with UE reaches in multi planes, head turns Gait  training in // with gait belt x 5 laps with emphasis on longer step length and upright posture; able to take 3 steps without UE support but unable to make a full lap in // bars Seated 3# LAQ 10x Seated 3# hip flexion 10x Seated 6# resting on knee: heel and toe raises 10x right/left Lat pulls 10# at dual cable (higher anchor point)  2 x 10  03/21/24: Nu-Step L5 5 min UE/LE Gait training in // with gait belt x 5 laps with emphasis on longer step length and upright posture. (Used UE support throughout) Flat cone tap with 4 cones (PT verbalizing colors) in // bars x 1 min 30 sec Seated shoulder press 2# DB x 10 Seated chest press 2# DB x 10 Seated biceps curl 2# DB x 10 Seated row 10# x 10 bilateral  Lat pulls 10# at dual cable (higher anchor point)  2 x 10    03/16/24: Nu-Step L5 5 min UE/LE Sit to stand from [facility] + cushion 5x pushing from armrests (more difficulty than usual rising all the way up) Gait training in // with gait belt x 4 laps with emphasis on longer step length and upright posture. Able to make one turn without  UE support Standing in // bars: pushing peanut ball back and forth for weight shifting, to challenge balance and increased LE weight bearing Standing in // bars pushing peanut ball down the bars 1 lap Attempted gait outside the // bars however the patient is unable to come to full standing from the WC Lat pulls 10# at dual cable (higher anchor point)  2 x 10   PATIENT EDUCATION:  Education details: PT eval findings, anticipated POC, initial HEP, postural awareness, and transfer  safety  Person educated: Patient and brother Education method: Explanation, Demonstration, and Handouts Education comprehension: verbalized understanding and returned demonstration  HOME EXERCISE PROGRAM: Access Code: [email or web address] URL: [email or web address] Date: 09/15/2023 Prepared by: Kristeen Sar  Exercises - Sit to Stand with Counter Support  - 3 x daily - 3-4 x weekly - 1 sets - 1-5 reps - Hooklying Clamshell with Resistance  - 2 x daily - 3-4 x weekly - 1 sets - 10 reps - Seated Long Arc Quad  - 1 x daily - 7 x weekly - 1-3 sets - 10 reps - 5 sec hold - Standing Hip Abduction with Counter Support  - 1 x daily - 7 x weekly - 1-2 sets - 10 reps - Standing March with Counter Support  - 1 x daily - 7 x weekly - 1-2 sets - 10 reps - Standing Heel Raise with Support  - 1 x daily - 7 x weekly - 1-2 sets - 10 reps - Shoulder External Rotation and Scapular Retraction with Resistance  - 1 x daily - 7 x weekly - 2 sets - 10 reps - Sit to Stand with Armchair  - 1 x daily - 7 x weekly - 1 sets - 5 reps  Patient Education - Tips to reduce freezing episodes with standing or walking   ASSESSMENT:  CLINICAL IMPRESSION: Pt is emotional today regarding his brain atrophy diagnosis and worries about the future and becoming bedbound.  Therapist educating pt on the benefits exercise not only for strengthening and mobility but also for brain health from increased blood flow.  He is able to increase standing and walking time today with less UE reliance.  Rotational movements incorporated into functional exercises to challenge coordination and engage stabilizing muscles therefore improving  overall strength and dynamic control.       OBJECTIVE IMPAIRMENTS: Abnormal gait, decreased balance, decreased ROM, decreased strength, decreased safety awareness, dizziness, impaired flexibility, impaired sensation, postural dysfunction, and pain.   ACTIVITY LIMITATIONS: carrying, lifting, bending,  standing, squatting, stairs, transfers, bed mobility, continence, bathing, and locomotion level  PARTICIPATION LIMITATIONS: shopping and community activity  PERSONAL FACTORS: Age, Fitness, Time since onset of injury/illness/exacerbation, and 3+ comorbidities: Compression fx L1 2022, CAD, CKD (stage 4), neuropathy, retinopathy are also affecting patient's functional outcome.   REHAB POTENTIAL: Good  CLINICAL DECISION MAKING: Unstable/unpredictable  EVALUATION COMPLEXITY: High   GOALS: Goals reviewed with patient? Yes  SHORT TERM GOALS: Target date: 07/30/23 Improved TUG to < 25 sec to decrease fall risk.  Baseline: Goal status: deferred  2.  Able to perform 5XSTS without UE assist and controlled descent showing improved LE strength.  Baseline:  Goal status: deferred 3.  Patient will be able to stand safely for 2 min without UE assist Baseline:  Goal status: met 5/27  LONG TERM GOALS: Target date: 04/28/2024  Ind with advanced HEP Baseline:  Goal status: IN PROGRESS   2.  Sit to stand from the recliner with PSFS score of 5 Baseline:  Goal status:  met 7/24  3.  Gait with RW from the bedroom to the sunroom 40-50 feet with PSFS of 3 Baseline:  Goal status: met 7/24  4. Improved LE mobility needed for greater ease with putting on socks and shoes in sitting with PSFS of 5 Baseline:  Goal status: met 7/24  5.  Patient will be able to stand long enough to brush teeth PSFS of 2 Baseline:  Goal status: met 5/27  6. Able to rise from 22 inch seat height push from the armrests to use the new chairs in the living room (in July) Goal status: met 7/24  7. Patient will be able to stand for 12 minutes needed for grooming and dressing tasks at home    Goal status: ongoing                       8. The patient will have improved LE strength with 5x STS test with UE assist improved to 25 sec NEW                       9. The patient will be able to walk for 6 min with UE support  with RW or // bars NEW  PLAN:  PT FREQUENCY: 2x week  PT DURATION: 8 weeks  PLANNED INTERVENTIONS: 97164- PT Re-evaluation, 97110-Therapeutic exercises, 97530- Therapeutic activity, 97112- Neuromuscular re-education, 97535- Self Care, [id number]- Manual therapy, 97116- Gait training, [phone]- Aquatic Therapy, 97014- Electrical stimulation (unattended), Patient/Family education, Balance training, Stair training, Taping, Joint mobilization, Spinal mobilization, DME instructions, Cryotherapy, and Moist heat  PLAN FOR NEXT SESSION:  Nu-Step; dual cable rows;  sit to stand from foam pad; static standing with teal power cord isometric trunk extension;  UE movements while standing for ADLs; gait in // bars working on standing erect, longer step lengths;  assess energy levels and endurance ; functional strengthening; pt has a goal to lift/carry his cat  Glade Pesa, PT 03/31/24 4:48 PM Phone: [phone] Fax: [phone]

## 2024-04-03 ENCOUNTER — Other Ambulatory Visit: Payer: Self-pay | Admitting: Family Medicine

## 2024-04-04 ENCOUNTER — Ambulatory Visit: Admitting: Physical Therapy

## 2024-04-04 ENCOUNTER — Encounter: Payer: Self-pay | Admitting: Physical Therapy

## 2024-04-04 DIAGNOSIS — M6281 Muscle weakness (generalized): Secondary | ICD-10-CM

## 2024-04-04 DIAGNOSIS — R262 Difficulty in walking, not elsewhere classified: Secondary | ICD-10-CM

## 2024-04-04 DIAGNOSIS — R2689 Other abnormalities of gait and mobility: Secondary | ICD-10-CM

## 2024-04-04 NOTE — Therapy (Signed)
 OUTPATIENT PHYSICAL THERAPY LOWER EXTREMITY TREATMENT    Patient Name: Jeffrey [name] MRN: [id number] DOB:[birth year], 68 y.o., male Today's Date: 04/04/2024    END OF SESSION:  PT End of Session - 04/04/24 1532     Visit Number 49    Date for PT Re-Evaluation 04/28/24    Authorization Type HTA    PT Start Time 1530    PT Stop Time 1614    PT Time Calculation (min) 44 min    Equipment Utilized During Treatment Gait belt    Activity Tolerance Patient tolerated treatment well                   Past Medical History:  Diagnosis Date   Cardiac arrest ([name]) 05/22/2016   Cataract    CHF (congestive heart failure) ([name])    CKD (chronic kidney disease) stage 3, GFR 30-59 ml/min ([name]) 05/28/2021   CKD (chronic kidney disease) stage 4, GFR 15-29 ml/min ([name]) 05/28/2021   Diabetes mellitus without complication ([name])    type 2   Diabetic retinopathy ([name])    Hyperlipidemia    Hypertension    Memory loss    mild   Myocardial infarct ([name]) 2105   Retinopathy    Both eyes   S/P primary angioplasty with coronary stent 05/22/2016   Sleep-disordered breathing 12/31/2023   Vitamin B12 deficiency    Vitreous hemorrhage of left eye ([name])    proliferative diabetic retinopathy   Past Surgical History:  Procedure Laterality Date   ANGIOPLASTY     2015   COLONOSCOPY     multiple   EYE SURGERY     PARS PLANA VITRECTOMY Left 03/15/2020   Procedure: PARS PLANA VITRECTOMY WITH 25 GAUGE;  Surgeon: Jarold Mayo, MD;  Location: [facility] OR;  Service: Ophthalmology;  Laterality: Left;   PHOTOCOAGULATION WITH LASER Left 03/15/2020   Procedure: PHOTOCOAGULATION WITH LASER; INTRAVITREAL INJECTION OF AVASTIN ;  Surgeon: Jarold Mayo, MD;  Location: [facility] OR;  Service: Ophthalmology;  Laterality: Left;   REFRACTIVE SURGERY     UPPER GI ENDOSCOPY     several   VITRECTOMY     Patient Active Problem List   Diagnosis Date Noted   Sleep-disordered breathing 12/31/2023   Actinic keratosis  11/30/2023   Vitamin D  deficiency 11/30/2023   Rhinitis 05/20/2023   Cervical spondylosis 08/26/2022   CKD (chronic kidney disease) stage 4, GFR 15-29 ml/min ([name]) 05/28/2021   Adjustment disorder 01/22/2021   Chronic back pain 10/22/2020   Peripheral vertigo 05/15/2020   Dermatitis 01/04/2020   Unintentional weight loss with loose stools 05/19/2019   Low vitamin B12 level 03/29/2019   Heme positive stool 03/14/2019   Normocytic anemia 03/14/2019   Chronic diastolic heart failure ([name]) 02/08/2019   Diabetic retinopathy ([name]) 09/08/2018   Physical debility 05/24/2018   Varicose veins of both lower extremities 03/01/2018   Fatigue due to exertion 10/14/2017   GERD (gastroesophageal reflux disease) 08/25/2017   CAD in native artery 04/14/2017   Hyperlipidemia associated with type 2 diabetes mellitus ([name]) 04/14/2017   Diabetic peripheral neuropathy associated with type 2 diabetes mellitus ([name]) 08/17/2016   [name] 05/02/2016   Hypertension associated with diabetes ([name]) 05/02/2016    PCP: Kennyth Worth HERO, MD   REFERRING PROVIDER: Kennyth Worth HERO, MD   REFERRING DIAG: R53.81 (ICD-10-CM) - Physical debility   THERAPY DIAG:  Other abnormalities of gait and mobility  Difficulty in walking, not elsewhere classified  Muscle weakness (generalized)  Rationale for  Evaluation and Treatment: Rehabilitation  ONSET DATE: 2022  SUBJECTIVE:   SUBJECTIVE STATEMENT: I don't remember being overly tired after last time.  Denies pain.  I'm just tired today but the neurologist says that is to be expected.      Arrives via transport chair assisted by [name]  Brother: [name] identical twins  PERTINENT HISTORY: 3/11: evidence of lumbar compression deformity Compression fx L1 2022, CAD, CKD (stage 4), DM, cardiac arrest 2017, neuropathy, retinopathy Long history of gait abnormality beginning back in 2022. He has a history of CAD and believes that his  debility began following a covid infection. In December odf 2022 he fell and suffered a compression fracture of L1.  PAIN:  Are you having pain? no  PRECAUTIONS: Fall  RED FLAGS: None   WEIGHT BEARING RESTRICTIONS: No  FALLS:  Has patient fallen in last 6 months? No and with sit to stand falls back sometimes  LIVING ENVIRONMENT: Lives with: lives with their family Lives in: House/apartment Stairs: No Has following equipment at home: Single point cane, Environmental consultant - 2 wheeled, Environmental consultant - 4 wheeled, shower chair, Grab bars, and stand up walker also; also has shower chair at sink for shaving, brushing teeth etc, also has transporter.  OCCUPATION: retired  PLOF: Independent with household mobility with device  PATIENT GOALS: to walk without a walker  NEXT MD VISIT: 09/28/22  OBJECTIVE:  Note: Objective measures were completed at Evaluation unless otherwise noted.  DIAGNOSTIC FINDINGS: N/A  COGNITION: Overall cognitive status: Within functional limits for tasks assessed     SENSATION: Neuropathy in B feet   MUSCLE LENGTH: Marked HS R>L, hip flexor tightness   POSTURE: rounded shoulders and forward head  LOWER EXTREMITY ROM: WFL for tasks assessed  LOWER EXTREMITY MMT: *tested in hooklying  MMT Right eval Left eval 4/1  5/27  Hip flexion 4 4 4  Bil 4 Bil  Hip extension Able to bridge Able to bridge 4- Bil 4- Bil  Hip abduction 4-* 4-* 4- Bil 4- Bil  Hip adduction      Hip internal rotation      Hip external rotation      Knee flexion      Knee extension 4 4+ 4- Bil 4- Bil  Ankle dorsiflexion 4+ 4- 4- Bil 4- Bil  Ankle plantarflexion      Ankle inversion      Ankle eversion       (Blank rows = not tested)  3/20:  max UE assist to rise sit to stand; LE hip extension, knee extension grossly 3+ to 4-/5  FUNCTIONAL TESTS:  30 seconds chair stand test Timed up and go (TUG): 59.36 with 4WW Stance time: 45 sec patient reports unsteadiness and feels he goes  backwards 07/27/2023 92 ft decreased cadence; decreased step length; absent heel strike  12/26: 5x STS: unable to rise without heavy UE use; attempted to use armrests for test but does not come all the way up;  next trial with chair +cushion to touch at least one RW handle 48.2 sec:   TUG: cushion in seat +arm use and RW:  52.49 sec  08/13/23 attempted 5x STS but pt unable to complete: 1st rep does not come all the way up and then plops heavily into the transport wheelchair (he then recounts how this happened at home and how the chair tipped backwards) unable to complete reps Unable to complete TUG today, he does not have his 4 wheeled walker today and feels he is  slower with the clinic RW  09/15/2023 TUG: 1:03 with rollator 5STS:48.55 sec heavy reliance on UE support; last repetition was a challenge  3/20: Heavy UE reliance with STS (unable to push up from [facility] armrests, pulls up from // bars) 4/1: max UE assist with sit to stand  5/27:  5x STS no cushion: able to complete 4 reps 47.19 sec            5x STS with cushion 22.5 seat height 5x 43.09 sec able to push up from [facility] armrests rather than // bars (much less reliance on Ues to rise)  7/24: 5x STS with cushion 53.30 needs UE assist from [facility], 2nd trial 32.22 push from [facility] armrests  GAIT: Distance walked: 25 Assistive device utilized: Walker - 4 wheeled Level of assistance: Modified independence Comments: slow speed, decreased step and stride, decreased heel strike, forward trunk lean.  TRANSFERS: Sit to stand: Uses walker to pull up; stand to sit: does not fully back up to chair and does not reach back; Supine to sit: needs min A for log roll  (has some back pain with sit <>supine due to sitting upward from supine) also fear of falling from bed, sit to supine needs min A to lift legs on to mat table The Patient-Specific Functional Scale  Initial:  I am going to ask you to identify up to 3 important activities that you are unable to  do or are having difficulty with as a result of this problem.  Today are there any activities that you are unable to do or having difficulty with because of this?  (Patient shown scale and patient rated each activity)  Follow up: When you first came in you had difficulty performing these activities.  Today do you still have difficulty?  Patient-Specific activity scoring scheme (Point to one number):  0 1 2 3 4 5 6 7 8 9  10 Unable                                                                                                          Able to perform To perform                                                                                                    activity at the same Activity         Level as before  Injury or problem  Activity            Getting out of the recliner                                                                     Initial:       3                5/27:  4    7/24: 4-5 2.                   Walking with RW 40 feet                                                                Initial:           1                             7/24: 6-7 3.                          Standing to brush teeth                                                           Initial:       0 (has to do sitting on shower chair)     5/27: 3       7/24: 4-5 4. Putting on socks and shoes  while sitting                                                      initial 2-3              7/24:   5  TODAY'S TREATMENT:  04/04/24: Nu-Step L5 7 min UE/LE while discussing status (40 spm, slower but longer duration)  Lat pulls 10# at dual cable (higher anchor point)  2 x 10 2 inch forward step up 5x right/left Weight shifting side to side without UE support 2 min 3x 15 second bouts of dancing in // bars to an [employer] without holding on  Sit to stand 5x pushing from armrests (cues for less reliance  on Ues)  Dynamic balance: Static standing with UE reaches in multi planes, head turns Gait training in // with gait belt x 12  laps with emphasis on longer step length and upright posture, able to do several laps without UE use on railing but needs UE support for turning around  03/31/24: Nu-Step L5 5 min UE/LE while discussing status (47 spm) increased speed Sit to stand 5x pushing from armrests (cues  for less reliance on Ues)  Dynamic balance: Static standing with UE reaches in multi planes, head turns Dynamic balance: holding beach ball with reaching up, down and side to side 5x each Dynamic balance: circle toe taps 2 rounds of 6 Gait training in // with gait belt x 12  laps with emphasis on longer step length and upright posture, 5-6 steps without UE use on railing; goal: few steps as possible --5 today! Usually 7 or more   03/29/24: Nu-Step L5 5 min UE/LE while discussing status  Dynamic balance: Bean bag toss 12x to circle 6 feet away (needs UE support) reports low back pain following (relieved with sitting rest break) Static standing with UE reaches in multi planes, head turns Gait training in // with gait belt x 5 laps with emphasis on longer step length and upright posture; able to take 3 steps without UE support but unable to make a full lap in // bars Seated 3# LAQ 10x Seated 3# hip flexion 10x Seated 6# resting on knee: heel and toe raises 10x right/left Lat pulls 10# at dual cable (higher anchor point)  2 x 10  03/21/24: Nu-Step L5 5 min UE/LE Gait training in // with gait belt x 5 laps with emphasis on longer step length and upright posture. (Used UE support throughout) Flat cone tap with 4 cones (PT verbalizing colors) in // bars x 1 min 30 sec Seated shoulder press 2# DB x 10 Seated chest press 2# DB x 10 Seated biceps curl 2# DB x 10 Seated row 10# x 10 bilateral  Lat pulls 10# at dual cable (higher anchor point)  2 x 10    03/16/24: Nu-Step L5 5 min UE/LE Sit to  stand from [facility] + cushion 5x pushing from armrests (more difficulty than usual rising all the way up) Gait training in // with gait belt x 4 laps with emphasis on longer step length and upright posture. Able to make one turn without  UE support Standing in // bars: pushing peanut ball back and forth for weight shifting, to challenge balance and increased LE weight bearing Standing in // bars pushing peanut ball down the bars 1 lap Attempted gait outside the // bars however the patient is unable to come to full standing from the WC Lat pulls 10# at dual cable (higher anchor point)  2 x 10   PATIENT EDUCATION:  Education details: PT eval findings, anticipated POC, initial HEP, postural awareness, and transfer safety  Person educated: Patient and brother Education method: Explanation, Demonstration, and Handouts Education comprehension: verbalized understanding and returned demonstration  HOME EXERCISE PROGRAM: Access Code: [email or web address] URL: [email or web address] Date: 09/15/2023 Prepared by: Kristeen Sar  Exercises - Sit to Stand with Counter Support  - 3 x daily - 3-4 x weekly - 1 sets - 1-5 reps - Hooklying Clamshell with Resistance  - 2 x daily - 3-4 x weekly - 1 sets - 10 reps - Seated Long Arc Quad  - 1 x daily - 7 x weekly - 1-3 sets - 10 reps - 5 sec hold - Standing Hip Abduction with Counter Support  - 1 x daily - 7 x weekly - 1-2 sets - 10 reps - Standing March with Counter Support  - 1 x daily - 7 x weekly - 1-2 sets - 10 reps - Standing Heel Raise with Support  - 1 x daily - 7 x weekly - 1-2 sets - 10 reps - Shoulder External Rotation and Scapular Retraction with  Resistance  - 1 x daily - 7 x weekly - 2 sets - 10 reps - Sit to Stand with Armchair  - 1 x daily - 7 x weekly - 1 sets - 5 reps  Patient Education - Tips to reduce freezing episodes with standing or walking   ASSESSMENT:  CLINICAL IMPRESSION: Improved steps with gait today with less of the LE  heaviness/quick sand sensation.  Verbal cues for forward gaze and for erect trunk in standing.  Able to do short increments of dancing without UE support today but with CGA on gait belt for safety.  Patient completed weight shifting in multiple directions with verbal cues to reduce wide base of support and improve midline control for safe standing with dressing and retrieval of items from upper shelves.      OBJECTIVE IMPAIRMENTS: Abnormal gait, decreased balance, decreased ROM, decreased strength, decreased safety awareness, dizziness, impaired flexibility, impaired sensation, postural dysfunction, and pain.   ACTIVITY LIMITATIONS: carrying, lifting, bending, standing, squatting, stairs, transfers, bed mobility, continence, bathing, and locomotion level  PARTICIPATION LIMITATIONS: shopping and community activity  PERSONAL FACTORS: Age, Fitness, Time since onset of injury/illness/exacerbation, and 3+ comorbidities: Compression fx L1 2022, CAD, CKD (stage 4), neuropathy, retinopathy are also affecting patient's functional outcome.   REHAB POTENTIAL: Good  CLINICAL DECISION MAKING: Unstable/unpredictable  EVALUATION COMPLEXITY: High   GOALS: Goals reviewed with patient? Yes  SHORT TERM GOALS: Target date: 07/30/23 Improved TUG to < 25 sec to decrease fall risk.  Baseline: Goal status: deferred  2.  Able to perform 5XSTS without UE assist and controlled descent showing improved LE strength.  Baseline:  Goal status: deferred 3.  Patient will be able to stand safely for 2 min without UE assist Baseline:  Goal status: met 5/27  LONG TERM GOALS: Target date: 04/28/2024  Ind with advanced HEP Baseline:  Goal status: IN PROGRESS   2.  Sit to stand from the recliner with PSFS score of 5 Baseline:  Goal status:  met 7/24  3.  Gait with RW from the bedroom to the sunroom 40-50 feet with PSFS of 3 Baseline:  Goal status: met 7/24  4. Improved LE mobility needed for greater ease  with putting on socks and shoes in sitting with PSFS of 5 Baseline:  Goal status: met 7/24  5.  Patient will be able to stand long enough to brush teeth PSFS of 2 Baseline:  Goal status: met 5/27  6. Able to rise from 22 inch seat height push from the armrests to use the new chairs in the living room (in July) Goal status: met 7/24  7. Patient will be able to stand for 12 minutes needed for grooming and dressing tasks at home    Goal status: ongoing                       8. The patient will have improved LE strength with 5x STS test with UE assist improved to 25 sec NEW                       9. The patient will be able to walk for 6 min with UE support with RW or // bars                           ongoing  PLAN:  PT FREQUENCY: 2x week  PT DURATION: 8 weeks  PLANNED INTERVENTIONS: 02835- PT  Re-evaluation, 97110-Therapeutic exercises, 97530- Therapeutic activity, [id number]- Neuromuscular re-education, [phone]- Self Care, [id number]- Manual therapy, [phone]- Gait training, [phone]- Aquatic Therapy, [phone]- Electrical stimulation (unattended), Patient/Family education, Balance training, Stair training, Taping, Joint mobilization, Spinal mobilization, DME instructions, Cryotherapy, and Moist heat  PLAN FOR NEXT SESSION: 50th visit progress note; 5x STS with UE support from W/C armrests rather than // bars;  Nu-Step; dual cable rows;  sit to stand from foam pad; static standing with teal power cord isometric trunk extension;  UE movements while standing for ADLs; gait in // bars working on standing erect, longer step lengths;  assess energy levels and endurance ; functional strengthening; pt has a goal to lift/carry his cat   Glade Pesa, PT 04/04/24 4:21 PM Phone: [phone] Fax: [phone]

## 2024-04-06 ENCOUNTER — Encounter: Payer: Self-pay | Admitting: Physical Therapy

## 2024-04-06 ENCOUNTER — Ambulatory Visit: Admitting: Physical Therapy

## 2024-04-06 DIAGNOSIS — R2689 Other abnormalities of gait and mobility: Secondary | ICD-10-CM

## 2024-04-06 DIAGNOSIS — M6281 Muscle weakness (generalized): Secondary | ICD-10-CM | POA: Diagnosis not present

## 2024-04-06 DIAGNOSIS — R262 Difficulty in walking, not elsewhere classified: Secondary | ICD-10-CM

## 2024-04-06 NOTE — Therapy (Signed)
 OUTPATIENT PHYSICAL THERAPY LOWER EXTREMITY TREATMENT  Progress Note Reporting Period 02/23/2024 to 04/06/2024  See note below for Objective Data and Assessment of Progress/Goals.     Patient Name: Jeffrey [name] MRN: [id number] DOB:Aug 08, 1956, 68 y.o., male Today's Date: 04/06/2024    END OF SESSION:  PT End of Session - 04/06/24 1653     Visit Number 50    Date for PT Re-Evaluation 04/28/24    Authorization Type HTA    Progress Note Due on Visit 60    PT Start Time 1531    PT Stop Time 1610    PT Time Calculation (min) 39 min    Equipment Utilized During Treatment Gait belt    Activity Tolerance Patient tolerated treatment well    Behavior During Therapy WFL for tasks assessed/performed                    Past Medical History:  Diagnosis Date   Cardiac arrest ([name]) 05/22/2016   Cataract    CHF (congestive heart failure) ([name])    CKD (chronic kidney disease) stage 3, GFR 30-59 ml/min ([name]) 05/28/2021   CKD (chronic kidney disease) stage 4, GFR 15-29 ml/min ([name]) 05/28/2021   Diabetes mellitus without complication ([name])    type 2   Diabetic retinopathy ([name])    Hyperlipidemia    Hypertension    Memory loss    mild   Myocardial infarct ([name]) 2105   Retinopathy    Both eyes   S/P primary angioplasty with coronary stent 05/22/2016   Sleep-disordered breathing 12/31/2023   Vitamin B12 deficiency    Vitreous hemorrhage of left eye ([name])    proliferative diabetic retinopathy   Past Surgical History:  Procedure Laterality Date   ANGIOPLASTY     2015   COLONOSCOPY     multiple   EYE SURGERY     PARS PLANA VITRECTOMY Left 03/15/2020   Procedure: PARS PLANA VITRECTOMY WITH 25 GAUGE;  Surgeon: Jarold Mayo, MD;  Location: [facility] OR;  Service: Ophthalmology;  Laterality: Left;   PHOTOCOAGULATION WITH LASER Left 03/15/2020   Procedure: PHOTOCOAGULATION WITH LASER; INTRAVITREAL INJECTION OF AVASTIN ;  Surgeon: Jarold Mayo, MD;  Location: [facility] OR;  Service:  Ophthalmology;  Laterality: Left;   REFRACTIVE SURGERY     UPPER GI ENDOSCOPY     several   VITRECTOMY     Patient Active Problem List   Diagnosis Date Noted   Sleep-disordered breathing 12/31/2023   Actinic keratosis 11/30/2023   Vitamin D  deficiency 11/30/2023   Rhinitis 05/20/2023   Cervical spondylosis 08/26/2022   CKD (chronic kidney disease) stage 4, GFR 15-29 ml/min ([name]) 05/28/2021   Adjustment disorder 01/22/2021   Chronic back pain 10/22/2020   Peripheral vertigo 05/15/2020   Dermatitis 01/04/2020   Unintentional weight loss with loose stools 05/19/2019   Low vitamin B12 level 03/29/2019   Heme positive stool 03/14/2019   Normocytic anemia 03/14/2019   Chronic diastolic heart failure ([name]) 02/08/2019   Diabetic retinopathy ([name]) 09/08/2018   Physical debility 05/24/2018   Varicose veins of both lower extremities 03/01/2018   Fatigue due to exertion 10/14/2017   GERD (gastroesophageal reflux disease) 08/25/2017   CAD in native artery 04/14/2017   Hyperlipidemia associated with type 2 diabetes mellitus ([name]) 04/14/2017   Diabetic peripheral neuropathy associated with type 2 diabetes mellitus ([name]) 08/17/2016   [name] 05/02/2016   Hypertension associated with diabetes ([name]) 05/02/2016    PCP: Kennyth Worth HERO, MD  REFERRING PROVIDER: Kennyth Worth HERO, MD   REFERRING DIAG: [phone] (ICD-10-CM) - Physical debility   THERAPY DIAG:  Other abnormalities of gait and mobility  Difficulty in walking, not elsewhere classified  Muscle weakness (generalized)  Rationale for Evaluation and Treatment: Rehabilitation  ONSET DATE: 2022  SUBJECTIVE:   SUBJECTIVE STATEMENT: I don't remember if I was tired after last session. I am not having any pain    Arrives via transport chair assisted by [name]  Brother: [name] identical twins  PERTINENT HISTORY: 3/11: evidence of lumbar compression deformity Compression fx L1 2022, CAD, CKD (stage  4), DM, cardiac arrest 2017, neuropathy, retinopathy Long history of gait abnormality beginning back in 2022. He has a history of CAD and believes that his debility began following a covid infection. In December odf 2022 he fell and suffered a compression fracture of L1.  PAIN:  Are you having pain? no  PRECAUTIONS: Fall  RED FLAGS: None   WEIGHT BEARING RESTRICTIONS: No  FALLS:  Has patient fallen in last 6 months? No and with sit to stand falls back sometimes  LIVING ENVIRONMENT: Lives with: lives with their family Lives in: House/apartment Stairs: No Has following equipment at home: Single point cane, Environmental consultant - 2 wheeled, Environmental consultant - 4 wheeled, shower chair, Grab bars, and stand up walker also; also has shower chair at sink for shaving, brushing teeth etc, also has transporter.  OCCUPATION: retired  PLOF: Independent with household mobility with device  PATIENT GOALS: to walk without a walker  NEXT MD VISIT: 09/28/22  OBJECTIVE:  Note: Objective measures were completed at Evaluation unless otherwise noted.  DIAGNOSTIC FINDINGS: N/A  COGNITION: Overall cognitive status: Within functional limits for tasks assessed     SENSATION: Neuropathy in B feet   MUSCLE LENGTH: Marked HS R>L, hip flexor tightness   POSTURE: rounded shoulders and forward head  LOWER EXTREMITY ROM: WFL for tasks assessed  LOWER EXTREMITY MMT: *tested in hooklying  MMT Right eval Left eval 4/1  5/27  Hip flexion 4 4 4  Bil 4 Bil  Hip extension Able to bridge Able to bridge 4- Bil 4- Bil  Hip abduction 4-* 4-* 4- Bil 4- Bil  Hip adduction      Hip internal rotation      Hip external rotation      Knee flexion      Knee extension 4 4+ 4- Bil 4- Bil  Ankle dorsiflexion 4+ 4- 4- Bil 4- Bil  Ankle plantarflexion      Ankle inversion      Ankle eversion       (Blank rows = not tested)  3/20:  max UE assist to rise sit to stand; LE hip extension, knee extension grossly 3+ to  4-/5  FUNCTIONAL TESTS:  30 seconds chair stand test Timed up and go (TUG): 59.36 with 4WW Stance time: 45 sec patient reports unsteadiness and feels he goes backwards 07/27/2023 92 ft decreased cadence; decreased step length; absent heel strike  12/26: 5x STS: unable to rise without heavy UE use; attempted to use armrests for test but does not come all the way up;  next trial with chair +cushion to touch at least one RW handle 48.2 sec:   TUG: cushion in seat +arm use and RW:  52.49 sec  08/13/23 attempted 5x STS but pt unable to complete: 1st rep does not come all the way up and then plops heavily into the transport wheelchair (he then recounts how this happened at home  and how the chair tipped backwards) unable to complete reps Unable to complete TUG today, he does not have his 4 wheeled walker today and feels he is slower with the clinic RW  09/15/2023 TUG: 1:03 with rollator 5STS:48.55 sec heavy reliance on UE support; last repetition was a challenge  3/20: Heavy UE reliance with STS (unable to push up from [facility] armrests, pulls up from // bars) 4/1: max UE assist with sit to stand  5/27:  5x STS no cushion: able to complete 4 reps 47.19 sec            5x STS with cushion 22.5 seat height 5x 43.09 sec able to push up from [facility] armrests rather than // bars (much less reliance on Ues to rise)  7/24: 5x STS with cushion 53.30 needs UE assist from [facility], 2nd trial 32.22 push from [facility] armrests  04/06/2024 5STS: 25.38 sec with UE support pushing from armrest (w/c + airex pad)  GAIT: Distance walked: 25 Assistive device utilized: Walker - 4 wheeled Level of assistance: Modified independence Comments: slow speed, decreased step and stride, decreased heel strike, forward trunk lean.  TRANSFERS: Sit to stand: Uses walker to pull up; stand to sit: does not fully back up to chair and does not reach back; Supine to sit: needs min A for log roll  (has some back pain with sit <>supine due to  sitting upward from supine) also fear of falling from bed, sit to supine needs min A to lift legs on to mat table The Patient-Specific Functional Scale  Initial:  I am going to ask you to identify up to 3 important activities that you are unable to do or are having difficulty with as a result of this problem.  Today are there any activities that you are unable to do or having difficulty with because of this?  (Patient shown scale and patient rated each activity)  Follow up: When you first came in you had difficulty performing these activities.  Today do you still have difficulty?  Patient-Specific activity scoring scheme (Point to one number):  0 1 2 3 4 5 6 7 8 9  10 Unable                                                                                                          Able to perform To perform                                                                                                    activity at the same Activity         Level as before  Injury or problem  Activity            Getting out of the recliner                                                                  Initial:                    5/27:  4    7/24: 4-5   8/27: 6  2.                   Walking with RW 40 feet                                                              Initial:1                                 7/24: 6-7    8/27: 6  3.                    Standing to brush teeth                                                           Initial:       0 (has to do sitting on shower chair)     5/27: 3       7/24: 4-5   8/27: 0(sits in shower chair)  4. Putting on socks and shoes  while sitting                                                      initial 2-3              7/24:   5   8/27: 0(his brother helps him)  TODAY'S TREATMENT:  04/06/24: 5STS: 25.38 sec with UE support pushing form armrest (w/c +  airex pad) Goals assessment & see PSFS assessment above Gait training in // with gait belt x 10/11  laps with emphasis on longer step length and upright posture, able to do several laps without UE use on railing but needs UE support for turning around. Required seated rest break and them ambulated  Seated biceps curl at dual cable 10# x 10 Patient was fatigued at this point and requested to leave     04/04/24: Nu-Step L5 7 min UE/LE while discussing status (40 spm, slower but longer duration)  Lat pulls 10# at dual cable (higher anchor point)  2 x 10 2 inch forward step up 5x right/left Weight shifting side to side without UE support 2 min 3x 15 second bouts of dancing in //  bars to an [employer] without holding on  Sit to stand 5x pushing from armrests (cues for less reliance on Ues)  Dynamic balance: Static standing with UE reaches in multi planes, head turns Gait training in // with gait belt x 12  laps with emphasis on longer step length and upright posture, able to do several laps without UE use on railing but needs UE support for turning around  03/31/24: Nu-Step L5 5 min UE/LE while discussing status (47 spm) increased speed Sit to stand 5x pushing from armrests (cues for less reliance on Ues)  Dynamic balance: Static standing with UE reaches in multi planes, head turns Dynamic balance: holding beach ball with reaching up, down and side to side 5x each Dynamic balance: circle toe taps 2 rounds of 6 Gait training in // with gait belt x 12  laps with emphasis on longer step length and upright posture, 5-6 steps without UE use on railing; goal: few steps as possible --5 today! Usually 7 or more   03/29/24: Nu-Step L5 5 min UE/LE while discussing status  Dynamic balance: Bean bag toss 12x to circle 6 feet away (needs UE support) reports low back pain following (relieved with sitting rest break) Static standing with UE reaches in multi planes, head turns Gait training in // with gait  belt x 5 laps with emphasis on longer step length and upright posture; able to take 3 steps without UE support but unable to make a full lap in // bars Seated 3# LAQ 10x Seated 3# hip flexion 10x Seated 6# resting on knee: heel and toe raises 10x right/left Lat pulls 10# at dual cable (higher anchor point)  2 x 10  03/21/24: Nu-Step L5 5 min UE/LE Gait training in // with gait belt x 5 laps with emphasis on longer step length and upright posture. (Used UE support throughout) Flat cone tap with 4 cones (PT verbalizing colors) in // bars x 1 min 30 sec Seated shoulder press 2# DB x 10 Seated chest press 2# DB x 10 Seated biceps curl 2# DB x 10 Seated row 10# x 10 bilateral  Lat pulls 10# at dual cable (higher anchor point)  2 x 10     PATIENT EDUCATION:  Education details: PT eval findings, anticipated POC, initial HEP, postural awareness, and transfer safety  Person educated: Patient and brother Education method: Explanation, Demonstration, and Handouts Education comprehension: verbalized understanding and returned demonstration  HOME EXERCISE PROGRAM: Access Code: [email or web address] URL: [email or web address] Date: 09/15/2023 Prepared by: Kristeen Sar  Exercises - Sit to Stand with Counter Support  - 3 x daily - 3-4 x weekly - 1 sets - 1-5 reps - Hooklying Clamshell with Resistance  - 2 x daily - 3-4 x weekly - 1 sets - 10 reps - Seated Long Arc Quad  - 1 x daily - 7 x weekly - 1-3 sets - 10 reps - 5 sec hold - Standing Hip Abduction with Counter Support  - 1 x daily - 7 x weekly - 1-2 sets - 10 reps - Standing March with Counter Support  - 1 x daily - 7 x weekly - 1-2 sets - 10 reps - Standing Heel Raise with Support  - 1 x daily - 7 x weekly - 1-2 sets - 10 reps - Shoulder External Rotation and Scapular Retraction with Resistance  - 1 x daily - 7 x weekly - 2 sets - 10 reps - Sit to Stand with Armchair  - 1 x  daily - 7 x weekly - 1 sets - 5 reps  Patient Education -  Tips to reduce freezing episodes with standing or walking   ASSESSMENT:  CLINICAL IMPRESSION: 50th visit progress not completed today. Patient improved on 5STS test by 7 seconds compared to previous administration. Patient still heavily relies on UE support to perform sit to stands. Based on PSFS patient ranked getting out of the recliner and walking with his walker better than previously. For ADLs he still sits in his chair and required assistance from his brother. With gait training today patient was able to make multiple laps without UE support he just required verbal cues for arm swing. Patient is making slow and steady progress with skilled therapy.    OBJECTIVE IMPAIRMENTS: Abnormal gait, decreased balance, decreased ROM, decreased strength, decreased safety awareness, dizziness, impaired flexibility, impaired sensation, postural dysfunction, and pain.   ACTIVITY LIMITATIONS: carrying, lifting, bending, standing, squatting, stairs, transfers, bed mobility, continence, bathing, and locomotion level  PARTICIPATION LIMITATIONS: shopping and community activity  PERSONAL FACTORS: Age, Fitness, Time since onset of injury/illness/exacerbation, and 3+ comorbidities: Compression fx L1 2022, CAD, CKD (stage 4), neuropathy, retinopathy are also affecting patient's functional outcome.   REHAB POTENTIAL: Good  CLINICAL DECISION MAKING: Unstable/unpredictable  EVALUATION COMPLEXITY: High   GOALS: Goals reviewed with patient? Yes  SHORT TERM GOALS: Target date: 07/30/23 Improved TUG to < 25 sec to decrease fall risk.  Baseline: Goal status: deferred  2.  Able to perform 5XSTS without UE assist and controlled descent showing improved LE strength.  Baseline:  Goal status: deferred 3.  Patient will be able to stand safely for 2 min without UE assist Baseline:  Goal status: met 5/27  LONG TERM GOALS: Target date: 04/28/2024  Ind with advanced HEP Baseline:  Goal status: IN PROGRESS    2.  Sit to stand from the recliner with PSFS score of 5 Baseline:  Goal status:  met 7/24  3.  Gait with RW from the bedroom to the sunroom 40-50 feet with PSFS of 3 Baseline:  Goal status: met 7/24  4. Improved LE mobility needed for greater ease with putting on socks and shoes in sitting with PSFS of 5 Baseline:  Goal status: met 7/24  5.  Patient will be able to stand long enough to brush teeth PSFS of 2 Baseline:  Goal status: met 5/27  6. Able to rise from 22 inch seat height push from the armrests to use the new chairs in the living room (in July) Goal status: met 7/24  7. Patient will be able to stand for 12 minutes needed for grooming and dressing tasks at home    Goal status: ongoing 04/06/2024                      8. The patient will have improved LE strength with 5x STS test with UE assist improved to 25 sec MET 8/27/225                       9. The patient will be able to walk for 6 min with UE support with RW or // bars                           Ongoing 04/06/2024  PLAN:  PT FREQUENCY: 2x week  PT DURATION: 8 weeks  PLANNED INTERVENTIONS: 97164- PT Re-evaluation, 97110-Therapeutic exercises, 97530- Therapeutic activity, 97112- Neuromuscular re-education, 97535-  Self Care, [id number]- Manual therapy, [phone]- Gait training, [phone]- Aquatic Therapy, [phone]- Electrical stimulation (unattended), Patient/Family education, Balance training, Stair training, Taping, Joint mobilization, Spinal mobilization, DME instructions, Cryotherapy, and Moist heat  PLAN FOR NEXT SESSION:Nu-Step; dual cable rows;  sit to stand from foam pad; static standing with teal power cord isometric trunk extension;  UE movements while standing for ADLs; gait in // bars working on standing erect, longer step lengths;  assess energy levels and endurance ; functional strengthening; pt has a goal to lift/carry his cat   Kristeen Sar, PT 04/06/24 4:54 PM

## 2024-04-07 ENCOUNTER — Encounter: Payer: Self-pay | Admitting: Neurology

## 2024-04-07 ENCOUNTER — Other Ambulatory Visit: Payer: Self-pay

## 2024-04-07 ENCOUNTER — Ambulatory Visit: Admitting: Neurology

## 2024-04-07 DIAGNOSIS — R4189 Other symptoms and signs involving cognitive functions and awareness: Secondary | ICD-10-CM

## 2024-04-07 DIAGNOSIS — M47812 Spondylosis without myelopathy or radiculopathy, cervical region: Secondary | ICD-10-CM

## 2024-04-07 DIAGNOSIS — G4733 Obstructive sleep apnea (adult) (pediatric): Secondary | ICD-10-CM

## 2024-04-07 DIAGNOSIS — Z72820 Sleep deprivation: Secondary | ICD-10-CM

## 2024-04-07 DIAGNOSIS — G9389 Other specified disorders of brain: Secondary | ICD-10-CM

## 2024-04-07 DIAGNOSIS — G629 Polyneuropathy, unspecified: Secondary | ICD-10-CM

## 2024-04-07 DIAGNOSIS — R269 Unspecified abnormalities of gait and mobility: Secondary | ICD-10-CM

## 2024-04-08 DIAGNOSIS — H25813 Combined forms of age-related cataract, bilateral: Secondary | ICD-10-CM | POA: Diagnosis not present

## 2024-04-08 DIAGNOSIS — E113593 Type 2 diabetes mellitus with proliferative diabetic retinopathy without macular edema, bilateral: Secondary | ICD-10-CM | POA: Diagnosis not present

## 2024-04-08 DIAGNOSIS — H524 Presbyopia: Secondary | ICD-10-CM | POA: Diagnosis not present

## 2024-04-08 LAB — HM DIABETES EYE EXAM

## 2024-04-12 ENCOUNTER — Other Ambulatory Visit: Payer: Self-pay | Admitting: Family Medicine

## 2024-04-12 ENCOUNTER — Ambulatory Visit: Admitting: Physical Therapy

## 2024-04-14 ENCOUNTER — Ambulatory Visit: Attending: Family Medicine | Admitting: Physical Therapy

## 2024-04-14 DIAGNOSIS — M6281 Muscle weakness (generalized): Secondary | ICD-10-CM | POA: Diagnosis not present

## 2024-04-14 DIAGNOSIS — R2689 Other abnormalities of gait and mobility: Secondary | ICD-10-CM | POA: Insufficient documentation

## 2024-04-14 DIAGNOSIS — R262 Difficulty in walking, not elsewhere classified: Secondary | ICD-10-CM | POA: Insufficient documentation

## 2024-04-14 NOTE — Therapy (Signed)
 OUTPATIENT PHYSICAL THERAPY LOWER EXTREMITY TREATMENT    Patient Name: Jeffrey [name] MRN: [id number] DOB:1956/05/19, 68 y.o., male Today's Date: 04/14/2024    END OF SESSION:  PT End of Session - 04/14/24 1351     Visit Number 51    Date for PT Re-Evaluation 04/28/24    Authorization Type HTA    Progress Note Due on Visit 60    PT Start Time 1400    PT Stop Time 1440    PT Time Calculation (min) 40 min    Activity Tolerance Patient tolerated treatment well                    Past Medical History:  Diagnosis Date   Cardiac arrest ([name]) 05/22/2016   Cataract    CHF (congestive heart failure) ([name])    CKD (chronic kidney disease) stage 3, GFR 30-59 ml/min ([name]) 05/28/2021   CKD (chronic kidney disease) stage 4, GFR 15-29 ml/min ([name]) 05/28/2021   Diabetes mellitus without complication ([name])    type 2   Diabetic retinopathy ([name])    Hyperlipidemia    Hypertension    Memory loss    mild   Myocardial infarct ([name]) 2105   Retinopathy    Both eyes   S/P primary angioplasty with coronary stent 05/22/2016   Sleep-disordered breathing 12/31/2023   Vitamin B12 deficiency    Vitreous hemorrhage of left eye ([name])    proliferative diabetic retinopathy   Past Surgical History:  Procedure Laterality Date   ANGIOPLASTY     2015   COLONOSCOPY     multiple   EYE SURGERY     PARS PLANA VITRECTOMY Left 03/15/2020   Procedure: PARS PLANA VITRECTOMY WITH 25 GAUGE;  Surgeon: Jarold Mayo, MD;  Location: [facility] OR;  Service: Ophthalmology;  Laterality: Left;   PHOTOCOAGULATION WITH LASER Left 03/15/2020   Procedure: PHOTOCOAGULATION WITH LASER; INTRAVITREAL INJECTION OF AVASTIN ;  Surgeon: Jarold Mayo, MD;  Location: [facility] OR;  Service: Ophthalmology;  Laterality: Left;   REFRACTIVE SURGERY     UPPER GI ENDOSCOPY     several   VITRECTOMY     Patient Active Problem List   Diagnosis Date Noted   Sleep-disordered breathing 12/31/2023   Actinic keratosis 11/30/2023   Vitamin D   deficiency 11/30/2023   Rhinitis 05/20/2023   Cervical spondylosis 08/26/2022   CKD (chronic kidney disease) stage 4, GFR 15-29 ml/min ([name]) 05/28/2021   Adjustment disorder 01/22/2021   Chronic back pain 10/22/2020   Peripheral vertigo 05/15/2020   Dermatitis 01/04/2020   Unintentional weight loss with loose stools 05/19/2019   Low vitamin B12 level 03/29/2019   Heme positive stool 03/14/2019   Normocytic anemia 03/14/2019   Chronic diastolic heart failure ([name]) 02/08/2019   Diabetic retinopathy ([name]) 09/08/2018   Physical debility 05/24/2018   Varicose veins of both lower extremities 03/01/2018   Fatigue due to exertion 10/14/2017   GERD (gastroesophageal reflux disease) 08/25/2017   CAD in native artery 04/14/2017   Hyperlipidemia associated with type 2 diabetes mellitus ([name]) 04/14/2017   Diabetic peripheral neuropathy associated with type 2 diabetes mellitus ([name]) 08/17/2016   [name] 05/02/2016   Hypertension associated with diabetes ([name]) 05/02/2016    PCP: Kennyth Worth HERO, MD   REFERRING PROVIDER: Kennyth Worth HERO, MD   REFERRING DIAG: R53.81 (ICD-10-CM) - Physical debility   THERAPY DIAG:  Other abnormalities of gait and mobility  Difficulty in walking, not elsewhere classified  Muscle weakness (generalized)  Rationale  for Evaluation and Treatment: Rehabilitation  ONSET DATE: 2022  SUBJECTIVE:   SUBJECTIVE STATEMENT: Haven't been up to much really.  Did alright after last time. No pain anywhere. Missed Tuesday b/c I had no energy for a day and a half.     Arrives via transport chair assisted by [name]  Brother: [name] identical twins  PERTINENT HISTORY: 3/11: evidence of lumbar compression deformity Compression fx L1 2022, CAD, CKD (stage 4), DM, cardiac arrest 2017, neuropathy, retinopathy Long history of gait abnormality beginning back in 2022. He has a history of CAD and believes that his debility began following a covid  infection. In December odf 2022 he fell and suffered a compression fracture of L1.  PAIN:  Are you having pain? no  PRECAUTIONS: Fall  RED FLAGS: None   WEIGHT BEARING RESTRICTIONS: No  FALLS:  Has patient fallen in last 6 months? No and with sit to stand falls back sometimes  LIVING ENVIRONMENT: Lives with: lives with their family Lives in: House/apartment Stairs: No Has following equipment at home: Single point cane, Environmental consultant - 2 wheeled, Environmental consultant - 4 wheeled, shower chair, Grab bars, and stand up walker also; also has shower chair at sink for shaving, brushing teeth etc, also has transporter.  OCCUPATION: retired  PLOF: Independent with household mobility with device  PATIENT GOALS: to walk without a walker  NEXT MD VISIT: 09/28/22  OBJECTIVE:  Note: Objective measures were completed at Evaluation unless otherwise noted.  DIAGNOSTIC FINDINGS: N/A  COGNITION: Overall cognitive status: Within functional limits for tasks assessed     SENSATION: Neuropathy in B feet   MUSCLE LENGTH: Marked HS R>L, hip flexor tightness   POSTURE: rounded shoulders and forward head  LOWER EXTREMITY ROM: WFL for tasks assessed  LOWER EXTREMITY MMT:  MMT Right eval Left eval 4/1  5/27 9/4  Hip flexion 4 4 4  Bil 4 Bil 4 Bil  Hip extension Able to bridge Able to bridge 4- Bil 4- Bil 4- Bil  Hip abduction 4-* 4-* 4- Bil 4- Bil 4- Bil  Hip adduction       Hip internal rotation       Hip external rotation       Knee flexion       Knee extension 4 4+ 4- Bil 4- Bil 4- Bil  Ankle dorsiflexion 4+ 4- 4- Bil 4- Bil 4- Bil  Ankle plantarflexion       Ankle inversion       Ankle eversion        (Blank rows = not tested)  3/20:  max UE assist to rise sit to stand; LE hip extension, knee extension grossly 3+ to 4-/5  FUNCTIONAL TESTS:  30 seconds chair stand test Timed up and go (TUG): 59.36 with 4WW Stance time: 45 sec patient reports unsteadiness and feels he goes  backwards 07/27/2023 92 ft decreased cadence; decreased step length; absent heel strike  12/26: 5x STS: unable to rise without heavy UE use; attempted to use armrests for test but does not come all the way up;  next trial with chair +cushion to touch at least one RW handle 48.2 sec:   TUG: cushion in seat +arm use and RW:  52.49 sec  08/13/23 attempted 5x STS but pt unable to complete: 1st rep does not come all the way up and then plops heavily into the transport wheelchair (he then recounts how this happened at home and how the chair tipped backwards) unable to complete reps Unable  to complete TUG today, he does not have his 4 wheeled walker today and feels he is slower with the clinic RW  09/15/2023 TUG: 1:03 with rollator 5STS:48.55 sec heavy reliance on UE support; last repetition was a challenge  3/20: Heavy UE reliance with STS (unable to push up from [facility] armrests, pulls up from // bars) 4/1: max UE assist with sit to stand  5/27:  5x STS no cushion: able to complete 4 reps 47.19 sec            5x STS with cushion 22.5 seat height 5x 43.09 sec able to push up from [facility] armrests rather than // bars (much less reliance on Ues to rise)  7/24: 5x STS with cushion 53.30 needs UE assist from [facility], 2nd trial 32.22 push from [facility] armrests  04/06/2024 5STS: 25.38 sec with UE support pushing from armrest (w/c + airex pad)  GAIT: Distance walked: 25 Assistive device utilized: Walker - 4 wheeled Level of assistance: Modified independence Comments: slow speed, decreased step and stride, decreased heel strike, forward trunk lean.  TRANSFERS: Sit to stand: Uses walker to pull up; stand to sit: does not fully back up to chair and does not reach back; Supine to sit: needs min A for log roll  (has some back pain with sit <>supine due to sitting upward from supine) also fear of falling from bed, sit to supine needs min A to lift legs on to mat table The Patient-Specific Functional Scale  Initial:  I  am going to ask you to identify up to 3 important activities that you are unable to do or are having difficulty with as a result of this problem.  Today are there any activities that you are unable to do or having difficulty with because of this?  (Patient shown scale and patient rated each activity)  Follow up: When you first came in you had difficulty performing these activities.  Today do you still have difficulty?  Patient-Specific activity scoring scheme (Point to one number):  0 1 2 3 4 5 6 7 8 9  10 Unable                                                                                                          Able to perform To perform                                                                                                    activity at the same Activity         Level as before  Injury or problem  Activity            Getting out of the recliner                                                                  Initial:                    5/27:  4    7/24: 4-5   8/27: 6  2.                   Walking with RW 40 feet                                                              Initial:1                                 7/24: 6-7    8/27: 6  3.                    Standing to brush teeth                                                           Initial:       0 (has to do sitting on shower chair)     5/27: 3       7/24: 4-5   8/27: 0(sits in shower chair)  4. Putting on socks and shoes  while sitting                                                      initial 2-3              7/24:   5   8/27: 0(his brother helps him)  TODAY'S TREATMENT:  04/14/24: Nu-Step L5 7 min UE/LE while discussing status (37 spm, slower but longer duration) Sit to stand pushing from transport chair Step over red band line on floor for increased step length 5x right/left  4 inch forward step up 5x  right/left Foot prop on 4 inch step with head turns, UE reaching in multi planes Dynamic balance: Static standing with UE reaches in multi planes, head turns Gait training in // with gait belt x 12  laps with emphasis on longer step length and upright posture, able to do several laps without UE use on railing but needs UE support for turning around Backwards walking in // bars 3 laps needs bil UE support Lat pulls 10# at dual cable (higher anchor point)  2 x 10  04/06/24: 5STS: 25.38 sec with UE support pushing form armrest (w/c +  airex pad) Goals assessment & see PSFS assessment above Gait training in // with gait belt x 10/11  laps with emphasis on longer step length and upright posture, able to do several laps without UE use on railing but needs UE support for turning around. Required seated rest break and them ambulated  Seated biceps curl at dual cable 10# x 10 Patient was fatigued at this point and requested to leave     04/04/24: Nu-Step L5 7 min UE/LE while discussing status (40 spm, slower but longer duration)  Lat pulls 10# at dual cable (higher anchor point)  2 x 10 2 inch forward step up 5x right/left Weight shifting side to side without UE support 2 min 3x 15 second bouts of dancing in // bars to an [employer] without holding on  Sit to stand 5x pushing from armrests (cues for less reliance on Ues)  Dynamic balance: Static standing with UE reaches in multi planes, head turns Gait training in // with gait belt x 12  laps with emphasis on longer step length and upright posture, able to do several laps without UE use on railing but needs UE support for turning around  03/31/24: Nu-Step L5 5 min UE/LE while discussing status (47 spm) increased speed Sit to stand 5x pushing from armrests (cues for less reliance on Ues)  Dynamic balance: Static standing with UE reaches in multi planes, head turns Dynamic balance: holding beach ball with reaching up, down and side to side 5x  each Dynamic balance: circle toe taps 2 rounds of 6 Gait training in // with gait belt x 12  laps with emphasis on longer step length and upright posture, 5-6 steps without UE use on railing; goal: few steps as possible --5 today! Usually 7 or more      PATIENT EDUCATION:  Education details: PT eval findings, anticipated POC, initial HEP, postural awareness, and transfer safety  Person educated: Patient and brother Education method: Explanation, Demonstration, and Handouts Education comprehension: verbalized understanding and returned demonstration  HOME EXERCISE PROGRAM: Access Code: [email or web address] URL: [email or web address] Date: 09/15/2023 Prepared by: Kristeen Sar  Exercises - Sit to Stand with Counter Support  - 3 x daily - 3-4 x weekly - 1 sets - 1-5 reps - Hooklying Clamshell with Resistance  - 2 x daily - 3-4 x weekly - 1 sets - 10 reps - Seated Long Arc Quad  - 1 x daily - 7 x weekly - 1-3 sets - 10 reps - 5 sec hold - Standing Hip Abduction with Counter Support  - 1 x daily - 7 x weekly - 1-2 sets - 10 reps - Standing March with Counter Support  - 1 x daily - 7 x weekly - 1-2 sets - 10 reps - Standing Heel Raise with Support  - 1 x daily - 7 x weekly - 1-2 sets - 10 reps - Shoulder External Rotation and Scapular Retraction with Resistance  - 1 x daily - 7 x weekly - 2 sets - 10 reps - Sit to Stand with Armchair  - 1 x daily - 7 x weekly - 1 sets - 5 reps  Patient Education - Tips to reduce freezing episodes with standing or walking   ASSESSMENT:  CLINICAL IMPRESSION: Improved step clearance today and decreased reliance on UE support with forwards walking.  He still requires heavy UE use with staggered foot positions, step ups and sit to stand.  Verbal cues for erect posture and forward gaze.  CGA for  safety with dynamic balance challenges.     OBJECTIVE IMPAIRMENTS: Abnormal gait, decreased balance, decreased ROM, decreased strength, decreased safety  awareness, dizziness, impaired flexibility, impaired sensation, postural dysfunction, and pain.   ACTIVITY LIMITATIONS: carrying, lifting, bending, standing, squatting, stairs, transfers, bed mobility, continence, bathing, and locomotion level  PARTICIPATION LIMITATIONS: shopping and community activity  PERSONAL FACTORS: Age, Fitness, Time since onset of injury/illness/exacerbation, and 3+ comorbidities: Compression fx L1 2022, CAD, CKD (stage 4), neuropathy, retinopathy are also affecting patient's functional outcome.   REHAB POTENTIAL: Good  CLINICAL DECISION MAKING: Unstable/unpredictable  EVALUATION COMPLEXITY: High   GOALS: Goals reviewed with patient? Yes  SHORT TERM GOALS: Target date: 07/30/23 Improved TUG to < 25 sec to decrease fall risk.  Baseline: Goal status: deferred  2.  Able to perform 5XSTS without UE assist and controlled descent showing improved LE strength.  Baseline:  Goal status: deferred 3.  Patient will be able to stand safely for 2 min without UE assist Baseline:  Goal status: met 5/27  LONG TERM GOALS: Target date: 04/28/2024  Ind with advanced HEP Baseline:  Goal status: IN PROGRESS   2.  Sit to stand from the recliner with PSFS score of 5 Baseline:  Goal status:  met 7/24  3.  Gait with RW from the bedroom to the sunroom 40-50 feet with PSFS of 3 Baseline:  Goal status: met 7/24  4. Improved LE mobility needed for greater ease with putting on socks and shoes in sitting with PSFS of 5 Baseline:  Goal status: met 7/24  5.  Patient will be able to stand long enough to brush teeth PSFS of 2 Baseline:  Goal status: met 5/27  6. Able to rise from 22 inch seat height push from the armrests to use the new chairs in the living room (in July) Goal status: met 7/24  7. Patient will be able to stand for 12 minutes needed for grooming and dressing tasks at home    Goal status: ongoing 04/06/2024                      8. The patient will have  improved LE strength with 5x STS test with UE assist improved to 25 sec MET 8/27/225                       9. The patient will be able to walk for 6 min with UE support with RW or // bars                           Ongoing 04/06/2024  PLAN:  PT FREQUENCY: 2x week  PT DURATION: 8 weeks  PLANNED INTERVENTIONS: 97164- PT Re-evaluation, 97110-Therapeutic exercises, 97530- Therapeutic activity, 97112- Neuromuscular re-education, 97535- Self Care, [id number]- Manual therapy, [phone]- Gait training, [phone]- Aquatic Therapy, 97014- Electrical stimulation (unattended), Patient/Family education, Balance training, Stair training, Taping, Joint mobilization, Spinal mobilization, DME instructions, Cryotherapy, and Moist heat  PLAN FOR NEXT SESSION:Nu-Step; dual cable rows;  sit to stand from foam pad; static standing with teal power cord isometric trunk extension;  UE movements while standing for ADLs; gait in // bars working on standing erect, longer step lengths;  assess energy levels and endurance ; functional strengthening; pt has a goal to lift/carry his cat  Glade Pesa, PT 04/14/24 6:05 PM Phone: [phone] Fax: [phone]

## 2024-04-18 ENCOUNTER — Ambulatory Visit (INDEPENDENT_AMBULATORY_CARE_PROVIDER_SITE_OTHER): Admitting: Family Medicine

## 2024-04-18 ENCOUNTER — Encounter: Payer: Self-pay | Admitting: Neurology

## 2024-04-18 ENCOUNTER — Encounter: Payer: Self-pay | Admitting: Family Medicine

## 2024-04-18 DIAGNOSIS — G919 Hydrocephalus, unspecified: Secondary | ICD-10-CM

## 2024-04-18 DIAGNOSIS — E1142 Type 2 diabetes mellitus with diabetic polyneuropathy: Secondary | ICD-10-CM

## 2024-04-18 DIAGNOSIS — Z794 Long term (current) use of insulin: Secondary | ICD-10-CM

## 2024-04-18 NOTE — Assessment & Plan Note (Signed)
 Advised him to come in soon for A1c.

## 2024-04-18 NOTE — Progress Notes (Signed)
   Jeffrey [name] is a 68 y.o. male who presents today for a telephone visit.  Assessment/Plan:  New/Acute Problems: Hydrocephalus Had lengthy discussion with patient and his brother today regarding recent workup including MRI and visit with neurology.  They are considering pursuing lumbar puncture at this time to definitively rule out NPH.  I believe this is reasonable.  They will contact neurology soon to discuss setting this up  Diarrhea Discussed limitations of phone visit and inability perform physical exam.  May be reaction to recent flu vaccine.  They can use over-the-counter meds as needed.  They will let us  know if not improving.  Chronic Problems Addressed Today: No problem-specific Assessment & Plan notes found for this encounter.     Subjective:  HPI:  Patient was scheduled for an office visit today to discuss recent visit with neurology. I last saw the patient about 3 months ago for Emergency Department follow up. Patient was not able to come into the office today however his brother was present who subsequently called his brother on the phone.  Video was not able to be performed.   Patient was not able to make it to the office today due to issues with diarrhea since getting flu vaccine a few days ago.  He did see neurology a month ago.  They reviewed his recent imaging.  Neurology did not feel strongly that his symptoms were due to NPH that thought it was more likely due to peripheral neuropathy.  They had discussed pursuing lumbar puncture to further evaluate however neurology did not strongly recommend this.  They are considering pursuing this.  Still having quite a bit of issues with ambulation and cognitive function.       Objective/Observations   NAD  Telephone Visit   I connected with Jeffrey [name] on 04/18/24 at 10:00 AM EDT via telephone and verified that I am speaking with the correct person using two identifiers. I discussed the limitations of evaluation and  management by telemedicine and the availability of in person appointments. The patient expressed understanding and agreed to proceed.   Patient location: Home Provider location: [place] [employer] Persons participating in the virtual visit: Myself, patient, and patient's brother.   A total of 30 minutes were spent on medical discussion.      Worth HERO. Kennyth, MD 04/18/2024 10:32 AM

## 2024-04-19 ENCOUNTER — Telehealth: Payer: Self-pay | Admitting: Neurology

## 2024-04-19 ENCOUNTER — Ambulatory Visit: Admitting: Physical Therapy

## 2024-04-19 DIAGNOSIS — R2689 Other abnormalities of gait and mobility: Secondary | ICD-10-CM

## 2024-04-19 DIAGNOSIS — M6281 Muscle weakness (generalized): Secondary | ICD-10-CM

## 2024-04-19 DIAGNOSIS — R262 Difficulty in walking, not elsewhere classified: Secondary | ICD-10-CM

## 2024-04-19 NOTE — Telephone Encounter (Signed)
 Pt called and left a long detailed message on the voicemail- I sent to renee's voicemail

## 2024-04-19 NOTE — Therapy (Signed)
 OUTPATIENT PHYSICAL THERAPY LOWER EXTREMITY TREATMENT    Patient Name: Jeffrey [name] MRN: [id number] DOB:[birth year], 68 y.o., male Today's Date: 04/19/2024    END OF SESSION:  PT End of Session - 04/19/24 1359     Visit Number 52    Date for PT Re-Evaluation 04/28/24    Authorization Type HTA    Progress Note Due on Visit 60    PT Start Time 1400    PT Stop Time 1440    PT Time Calculation (min) 40 min    Equipment Utilized During Treatment Gait belt    Activity Tolerance Patient tolerated treatment well                    Past Medical History:  Diagnosis Date   Cardiac arrest ([name]) 05/22/2016   Cataract    CHF (congestive heart failure) ([name])    CKD (chronic kidney disease) stage 3, GFR 30-59 ml/min ([name]) 05/28/2021   CKD (chronic kidney disease) stage 4, GFR 15-29 ml/min ([name]) 05/28/2021   Diabetes mellitus without complication ([name])    type 2   Diabetic retinopathy ([name])    Hyperlipidemia    Hypertension    Memory loss    mild   Myocardial infarct ([name]) 2105   Retinopathy    Both eyes   S/P primary angioplasty with coronary stent 05/22/2016   Sleep-disordered breathing 12/31/2023   Vitamin B12 deficiency    Vitreous hemorrhage of left eye ([name])    proliferative diabetic retinopathy   Past Surgical History:  Procedure Laterality Date   ANGIOPLASTY     2015   COLONOSCOPY     multiple   EYE SURGERY     PARS PLANA VITRECTOMY Left 03/15/2020   Procedure: PARS PLANA VITRECTOMY WITH 25 GAUGE;  Surgeon: Jarold Mayo, MD;  Location: [facility] OR;  Service: Ophthalmology;  Laterality: Left;   PHOTOCOAGULATION WITH LASER Left 03/15/2020   Procedure: PHOTOCOAGULATION WITH LASER; INTRAVITREAL INJECTION OF AVASTIN ;  Surgeon: Jarold Mayo, MD;  Location: [facility] OR;  Service: Ophthalmology;  Laterality: Left;   REFRACTIVE SURGERY     UPPER GI ENDOSCOPY     several   VITRECTOMY     Patient Active Problem List   Diagnosis Date Noted   Sleep-disordered breathing  12/31/2023   Actinic keratosis 11/30/2023   Vitamin D  deficiency 11/30/2023   Rhinitis 05/20/2023   Cervical spondylosis 08/26/2022   CKD (chronic kidney disease) stage 4, GFR 15-29 ml/min ([name]) 05/28/2021   Adjustment disorder 01/22/2021   Chronic back pain 10/22/2020   Peripheral vertigo 05/15/2020   Dermatitis 01/04/2020   Unintentional weight loss with loose stools 05/19/2019   Low vitamin B12 level 03/29/2019   Heme positive stool 03/14/2019   Normocytic anemia 03/14/2019   Chronic diastolic heart failure ([name]) 02/08/2019   Diabetic retinopathy ([name]) 09/08/2018   Physical debility 05/24/2018   Varicose veins of both lower extremities 03/01/2018   Fatigue due to exertion 10/14/2017   GERD (gastroesophageal reflux disease) 08/25/2017   CAD in native artery 04/14/2017   Hyperlipidemia associated with type 2 diabetes mellitus ([name]) 04/14/2017   Diabetic peripheral neuropathy associated with type 2 diabetes mellitus ([name]) 08/17/2016   [name] 05/02/2016   Hypertension associated with diabetes ([name]) 05/02/2016    PCP: Kennyth Worth HERO, MD   REFERRING PROVIDER: Kennyth Worth HERO, MD   REFERRING DIAG: R53.81 (ICD-10-CM) - Physical debility   THERAPY DIAG:  Other abnormalities of gait and mobility  Difficulty in walking,  not elsewhere classified  Muscle weakness (generalized)  Rationale for Evaluation and Treatment: Rehabilitation  ONSET DATE: 2022  SUBJECTIVE:   SUBJECTIVE STATEMENT: I don't really remember how I did after last time.  I was sick to my stomach.  I am going to have the spinal tap to eliminate any doubt.  My primary Dr. Kennyth said Why wouldn't you?  Waiting to find out when it will be.     Arrives via transport chair assisted by [name]  Brother: [name] identical twins  PERTINENT HISTORY: 3/11: evidence of lumbar compression deformity Compression fx L1 2022, CAD, CKD (stage 4), DM, cardiac arrest 2017, neuropathy,  retinopathy Long history of gait abnormality beginning back in 2022. He has a history of CAD and believes that his debility began following a covid infection. In December odf 2022 he fell and suffered a compression fracture of L1.  PAIN:  Are you having pain? no  PRECAUTIONS: Fall  RED FLAGS: None   WEIGHT BEARING RESTRICTIONS: No  FALLS:  Has patient fallen in last 6 months? No and with sit to stand falls back sometimes  LIVING ENVIRONMENT: Lives with: lives with their family Lives in: House/apartment Stairs: No Has following equipment at home: Single point cane, Environmental consultant - 2 wheeled, Environmental consultant - 4 wheeled, shower chair, Grab bars, and stand up walker also; also has shower chair at sink for shaving, brushing teeth etc, also has transporter.  OCCUPATION: retired  PLOF: Independent with household mobility with device  PATIENT GOALS: to walk without a walker  NEXT MD VISIT: 09/28/22  OBJECTIVE:  Note: Objective measures were completed at Evaluation unless otherwise noted.  DIAGNOSTIC FINDINGS: N/A  COGNITION: Overall cognitive status: Within functional limits for tasks assessed     SENSATION: Neuropathy in B feet   MUSCLE LENGTH: Marked HS R>L, hip flexor tightness   POSTURE: rounded shoulders and forward head  LOWER EXTREMITY ROM: WFL for tasks assessed  LOWER EXTREMITY MMT:  MMT Right eval Left eval 4/1  5/27 9/4  Hip flexion 4 4 4  Bil 4 Bil 4 Bil  Hip extension Able to bridge Able to bridge 4- Bil 4- Bil 4- Bil  Hip abduction 4-* 4-* 4- Bil 4- Bil 4- Bil  Hip adduction       Hip internal rotation       Hip external rotation       Knee flexion       Knee extension 4 4+ 4- Bil 4- Bil 4- Bil  Ankle dorsiflexion 4+ 4- 4- Bil 4- Bil 4- Bil  Ankle plantarflexion       Ankle inversion       Ankle eversion        (Blank rows = not tested)  3/20:  max UE assist to rise sit to stand; LE hip extension, knee extension grossly 3+ to 4-/5  FUNCTIONAL TESTS:  30  seconds chair stand test Timed up and go (TUG): 59.36 with 4WW Stance time: 45 sec patient reports unsteadiness and feels he goes backwards 07/27/2023 92 ft decreased cadence; decreased step length; absent heel strike  12/26: 5x STS: unable to rise without heavy UE use; attempted to use armrests for test but does not come all the way up;  next trial with chair +cushion to touch at least one RW handle 48.2 sec:   TUG: cushion in seat +arm use and RW:  52.49 sec  08/13/23 attempted 5x STS but pt unable to complete: 1st rep does not come all the  way up and then plops heavily into the transport wheelchair (he then recounts how this happened at home and how the chair tipped backwards) unable to complete reps Unable to complete TUG today, he does not have his 4 wheeled walker today and feels he is slower with the clinic RW  09/15/2023 TUG: 1:03 with rollator 5STS:48.55 sec heavy reliance on UE support; last repetition was a challenge  3/20: Heavy UE reliance with STS (unable to push up from [facility] armrests, pulls up from // bars) 4/1: max UE assist with sit to stand  5/27:  5x STS no cushion: able to complete 4 reps 47.19 sec            5x STS with cushion 22.5 seat height 5x 43.09 sec able to push up from [facility] armrests rather than // bars (much less reliance on Ues to rise)  7/24: 5x STS with cushion 53.30 needs UE assist from [facility], 2nd trial 32.22 push from [facility] armrests  04/06/2024 5STS: 25.38 sec with UE support pushing from armrest (w/c + airex pad)  GAIT: Distance walked: 25 Assistive device utilized: Walker - 4 wheeled Level of assistance: Modified independence Comments: slow speed, decreased step and stride, decreased heel strike, forward trunk lean.  TRANSFERS: Sit to stand: Uses walker to pull up; stand to sit: does not fully back up to chair and does not reach back; Supine to sit: needs min A for log roll  (has some back pain with sit <>supine due to sitting upward from supine) also  fear of falling from bed, sit to supine needs min A to lift legs on to mat table The Patient-Specific Functional Scale  Initial:  I am going to ask you to identify up to 3 important activities that you are unable to do or are having difficulty with as a result of this problem.  Today are there any activities that you are unable to do or having difficulty with because of this?  (Patient shown scale and patient rated each activity)  Follow up: When you first came in you had difficulty performing these activities.  Today do you still have difficulty?  Patient-Specific activity scoring scheme (Point to one number):  0 1 2 3 4 5 6 7 8 9  10 Unable                                                                                                          Able to perform To perform                                                                                                    activity at  the same Activity         Level as before                                                                                                                       Injury or problem  Activity            Getting out of the recliner                                                                  Initial:                    5/27:  4    7/24: 4-5   8/27: 6  2.                   Walking with RW 40 feet                                                              Initial:1                                 7/24: 6-7    8/27: 6  3.                    Standing to brush teeth                                                           Initial:       0 (has to do sitting on shower chair)     5/27: 3       7/24: 4-5   8/27: 0(sits in shower chair)  4. Putting on socks and shoes  while sitting                                                      initial 2-3              7/24:   5   8/27: 0(his brother helps him)  TODAY'S TREATMENT:  04/19/24: Nu-Step L5 7 min UE/LE while discussing status; last 2 minutes  challenged pt to keep at 50  spm  Sit to stand pushing from transport chair 5x 5# Kettlebell rows to simulate picking up his cat 10x right and left without UE support Walk in // bars with single side 5# KB hold 4 laps to simulate carrying a shopping bag Dynamic balance: Static standing with UE reaches in multi planes, trunk rotation, high stepping Gait training in // with gait belt x 10  laps with emphasis on longer step length and upright posture, able to do several laps without UE use on railing but needs UE support for turning around Backwards walking in // bars 2 laps needs bil UE support Lat pulls 10# at dual cable (higher anchor point)  2 x 10  04/14/24: Nu-Step L5 7 min UE/LE while discussing status (37 spm, slower but longer duration) Sit to stand pushing from transport chair Step over red band line on floor for increased step length 5x right/left  4 inch forward step up 5x right/left Foot prop on 4 inch step with head turns, UE reaching in multi planes Dynamic balance: Static standing with UE reaches in multi planes, head turns Gait training in // with gait belt x 12  laps with emphasis on longer step length and upright posture, able to do several laps without UE use on railing but needs UE support for turning around Backwards walking in // bars 3 laps needs bil UE support Lat pulls 10# at dual cable (higher anchor point)  2 x 10  04/06/24: 5STS: 25.38 sec with UE support pushing form armrest (w/c + airex pad) Goals assessment & see PSFS assessment above Gait training in // with gait belt x 10/11  laps with emphasis on longer step length and upright posture, able to do several laps without UE use on railing but needs UE support for turning around. Required seated rest break and them ambulated  Seated biceps curl at dual cable 10# x 10 Patient was fatigued at this point and requested to leave     04/04/24: Nu-Step L5 7 min UE/LE while discussing status (40 spm, slower but longer duration)  Lat pulls 10# at  dual cable (higher anchor point)  2 x 10 2 inch forward step up 5x right/left Weight shifting side to side without UE support 2 min 3x 15 second bouts of dancing in // bars to an [employer] without holding on  Sit to stand 5x pushing from armrests (cues for less reliance on Ues)  Dynamic balance: Static standing with UE reaches in multi planes, head turns Gait training in // with gait belt x 12  laps with emphasis on longer step length and upright posture, able to do several laps without UE use on railing but needs UE support for turning around   PATIENT EDUCATION:  Education details: PT eval findings, anticipated POC, initial HEP, postural awareness, and transfer safety  Person educated: Patient and brother Education method: Explanation, Demonstration, and Handouts Education comprehension: verbalized understanding and returned demonstration  HOME EXERCISE PROGRAM: Access Code: [email or web address] URL: [email or web address] Date: 09/15/2023 Prepared by: Kristeen Sar  Exercises - Sit to Stand with Counter Support  - 3 x daily - 3-4 x weekly - 1 sets - 1-5 reps - Hooklying Clamshell with Resistance  - 2 x daily - 3-4 x weekly - 1 sets - 10 reps - Seated Long Arc Quad  - 1 x daily - 7 x weekly - 1-3 sets - 10 reps - 5 sec hold - Standing Hip Abduction with  Counter Support  - 1 x daily - 7 x weekly - 1-2 sets - 10 reps - Standing March with Counter Support  - 1 x daily - 7 x weekly - 1-2 sets - 10 reps - Standing Heel Raise with Support  - 1 x daily - 7 x weekly - 1-2 sets - 10 reps - Shoulder External Rotation and Scapular Retraction with Resistance  - 1 x daily - 7 x weekly - 2 sets - 10 reps - Sit to Stand with Armchair  - 1 x daily - 7 x weekly - 1 sets - 5 reps  Patient Education - Tips to reduce freezing episodes with standing or walking   ASSESSMENT:  CLINICAL IMPRESSION: [name] reports he feels disoriented from doing ex's at the opposite end of the parallel bars from  where we usually do his therapy.  Verbal cues for increased speed in the final 2 minutes of the Nu-Step for spinal paraspinal muscle conditioning.  He initially reports the standing rows to simulate picking up his cat seem easy but he fatigues rather quickly in < 10 reps.  Decreased foot clearance with 5# object carry but able to take 5 steps without UE hold.  Therapist monitoring response and modifying treatment accordingly.     OBJECTIVE IMPAIRMENTS: Abnormal gait, decreased balance, decreased ROM, decreased strength, decreased safety awareness, dizziness, impaired flexibility, impaired sensation, postural dysfunction, and pain.   ACTIVITY LIMITATIONS: carrying, lifting, bending, standing, squatting, stairs, transfers, bed mobility, continence, bathing, and locomotion level  PARTICIPATION LIMITATIONS: shopping and community activity  PERSONAL FACTORS: Age, Fitness, Time since onset of injury/illness/exacerbation, and 3+ comorbidities: Compression fx L1 2022, CAD, CKD (stage 4), neuropathy, retinopathy are also affecting patient's functional outcome.   REHAB POTENTIAL: Good  CLINICAL DECISION MAKING: Unstable/unpredictable  EVALUATION COMPLEXITY: High   GOALS: Goals reviewed with patient? Yes  SHORT TERM GOALS: Target date: 07/30/23 Improved TUG to < 25 sec to decrease fall risk.  Baseline: Goal status: deferred  2.  Able to perform 5XSTS without UE assist and controlled descent showing improved LE strength.  Baseline:  Goal status: deferred 3.  Patient will be able to stand safely for 2 min without UE assist Baseline:  Goal status: met 5/27  LONG TERM GOALS: Target date: 04/28/2024  Ind with advanced HEP Baseline:  Goal status: IN PROGRESS   2.  Sit to stand from the recliner with PSFS score of 5 Baseline:  Goal status:  met 7/24  3.  Gait with RW from the bedroom to the sunroom 40-50 feet with PSFS of 3 Baseline:  Goal status: met 7/24  4. Improved LE mobility needed  for greater ease with putting on socks and shoes in sitting with PSFS of 5 Baseline:  Goal status: met 7/24  5.  Patient will be able to stand long enough to brush teeth PSFS of 2 Baseline:  Goal status: met 5/27  6. Able to rise from 22 inch seat height push from the armrests to use the new chairs in the living room (in July) Goal status: met 7/24  7. Patient will be able to stand for 12 minutes needed for grooming and dressing tasks at home    Goal status: ongoing 04/06/2024                      8. The patient will have improved LE strength with 5x STS test with UE assist improved to 25 sec MET 8/27/225  9. The patient will be able to walk for 6 min with UE support with RW or // bars                           Ongoing 04/06/2024  PLAN:  PT FREQUENCY: 2x week  PT DURATION: 8 weeks  PLANNED INTERVENTIONS: 97164- PT Re-evaluation, 97110-Therapeutic exercises, 97530- Therapeutic activity, 97112- Neuromuscular re-education, 97535- Self Care, [id number]- Manual therapy, [phone]- Gait training, [phone]- Aquatic Therapy, 97014- Electrical stimulation (unattended), Patient/Family education, Balance training, Stair training, Taping, Joint mobilization, Spinal mobilization, DME instructions, Cryotherapy, and Moist heat  PLAN FOR NEXT SESSION:Nu-Step; dual cable rows;  sit to stand from foam pad; static standing with teal power cord isometric trunk extension;  UE movements while standing for ADLs; gait in // bars working on standing erect, longer step lengths;  assess energy levels and endurance ; functional strengthening; pt has a goal to lift/carry his cat  Glade Pesa, PT 04/19/24 5:50 PM Phone: [phone] Fax: [phone]

## 2024-04-19 NOTE — Telephone Encounter (Signed)
**Note De-identified  [place] Obfuscation** Please advise 

## 2024-04-19 NOTE — Telephone Encounter (Signed)
 Pt cld to discuss Lumbar Puncture, pls call bk

## 2024-04-19 NOTE — Telephone Encounter (Signed)
 Called pt and I am checking on LP

## 2024-04-20 NOTE — Progress Notes (Signed)
 Prior authorization faxed to [employer]. Pending determination.

## 2024-04-20 NOTE — Telephone Encounter (Signed)
 It is okay for them to wait until he gets back in the office.

## 2024-04-21 ENCOUNTER — Ambulatory Visit: Admitting: Physical Therapy

## 2024-04-21 ENCOUNTER — Encounter: Payer: Self-pay | Admitting: Physical Therapy

## 2024-04-21 DIAGNOSIS — M6281 Muscle weakness (generalized): Secondary | ICD-10-CM

## 2024-04-21 DIAGNOSIS — R2689 Other abnormalities of gait and mobility: Secondary | ICD-10-CM | POA: Diagnosis not present

## 2024-04-21 DIAGNOSIS — R262 Difficulty in walking, not elsewhere classified: Secondary | ICD-10-CM

## 2024-04-21 NOTE — Therapy (Signed)
 OUTPATIENT PHYSICAL THERAPY LOWER EXTREMITY TREATMENT/ RE-CERTIFICATION    Patient Name: Jeffrey [name] MRN: [id number] DOB:[birth year], 68 y.o., male Today's Date: 04/21/2024    END OF SESSION:  PT End of Session - 04/21/24 1502     Visit Number 53    Date for PT Re-Evaluation 06/16/24    Authorization Type HTA    Progress Note Due on Visit 60    PT Start Time 1400    PT Stop Time 1444    PT Time Calculation (min) 44 min    Equipment Utilized During Treatment Gait belt    Activity Tolerance Patient tolerated treatment well    Behavior During Therapy WFL for tasks assessed/performed                     Past Medical History:  Diagnosis Date   Cardiac arrest ([name]) 05/22/2016   Cataract    CHF (congestive heart failure) ([name])    CKD (chronic kidney disease) stage 3, GFR 30-59 ml/min ([name]) 05/28/2021   CKD (chronic kidney disease) stage 4, GFR 15-29 ml/min ([name]) 05/28/2021   Diabetes mellitus without complication ([name])    type 2   Diabetic retinopathy ([name])    Hyperlipidemia    Hypertension    Memory loss    mild   Myocardial infarct ([name]) 2105   Retinopathy    Both eyes   S/P primary angioplasty with coronary stent 05/22/2016   Sleep-disordered breathing 12/31/2023   Vitamin B12 deficiency    Vitreous hemorrhage of left eye ([name])    proliferative diabetic retinopathy   Past Surgical History:  Procedure Laterality Date   ANGIOPLASTY     2015   COLONOSCOPY     multiple   EYE SURGERY     PARS PLANA VITRECTOMY Left 03/15/2020   Procedure: PARS PLANA VITRECTOMY WITH 25 GAUGE;  Surgeon: Jarold Mayo, MD;  Location: [facility] OR;  Service: Ophthalmology;  Laterality: Left;   PHOTOCOAGULATION WITH LASER Left 03/15/2020   Procedure: PHOTOCOAGULATION WITH LASER; INTRAVITREAL INJECTION OF AVASTIN ;  Surgeon: Jarold Mayo, MD;  Location: [facility] OR;  Service: Ophthalmology;  Laterality: Left;   REFRACTIVE SURGERY     UPPER GI ENDOSCOPY     several   VITRECTOMY      Patient Active Problem List   Diagnosis Date Noted   Sleep-disordered breathing 12/31/2023   Actinic keratosis 11/30/2023   Vitamin D  deficiency 11/30/2023   Rhinitis 05/20/2023   Cervical spondylosis 08/26/2022   CKD (chronic kidney disease) stage 4, GFR 15-29 ml/min ([name]) 05/28/2021   Adjustment disorder 01/22/2021   Chronic back pain 10/22/2020   Peripheral vertigo 05/15/2020   Dermatitis 01/04/2020   Unintentional weight loss with loose stools 05/19/2019   Low vitamin B12 level 03/29/2019   Heme positive stool 03/14/2019   Normocytic anemia 03/14/2019   Chronic diastolic heart failure ([name]) 02/08/2019   Diabetic retinopathy ([name]) 09/08/2018   Physical debility 05/24/2018   Varicose veins of both lower extremities 03/01/2018   Fatigue due to exertion 10/14/2017   GERD (gastroesophageal reflux disease) 08/25/2017   CAD in native artery 04/14/2017   Hyperlipidemia associated with type 2 diabetes mellitus ([name]) 04/14/2017   Diabetic peripheral neuropathy associated with type 2 diabetes mellitus ([name]) 08/17/2016   [name] 05/02/2016   Hypertension associated with diabetes ([name]) 05/02/2016    PCP: Kennyth Worth HERO, MD   REFERRING PROVIDER: Kennyth Worth HERO, MD   REFERRING DIAG: R53.81 (ICD-10-CM) - Physical debility   THERAPY  DIAG:  Other abnormalities of gait and mobility  Difficulty in walking, not elsewhere classified  Muscle weakness (generalized)  Rationale for Evaluation and Treatment: Rehabilitation  ONSET DATE: 2022  SUBJECTIVE:   SUBJECTIVE STATEMENT: Patient reports he is doing okay today. No new complaints    Arrives via transport chair assisted by [name]  Brother: [name] identical twins  PERTINENT HISTORY: 3/11: evidence of lumbar compression deformity Compression fx L1 2022, CAD, CKD (stage 4), DM, cardiac arrest 2017, neuropathy, retinopathy Long history of gait abnormality beginning back in 2022. He has a history  of CAD and believes that his debility began following a covid infection. In December odf 2022 he fell and suffered a compression fracture of L1.  PAIN:  Are you having pain? no  PRECAUTIONS: Fall  RED FLAGS: None   WEIGHT BEARING RESTRICTIONS: No  FALLS:  Has patient fallen in last 6 months? No and with sit to stand falls back sometimes  LIVING ENVIRONMENT: Lives with: lives with their family Lives in: House/apartment Stairs: No Has following equipment at home: Single point cane, Environmental consultant - 2 wheeled, Environmental consultant - 4 wheeled, shower chair, Grab bars, and stand up walker also; also has shower chair at sink for shaving, brushing teeth etc, also has transporter.  OCCUPATION: retired  PLOF: Independent with household mobility with device  PATIENT GOALS: to walk without a walker  NEXT MD VISIT: 09/28/22  OBJECTIVE:  Note: Objective measures were completed at Evaluation unless otherwise noted.  DIAGNOSTIC FINDINGS: N/A  COGNITION: Overall cognitive status: Within functional limits for tasks assessed     SENSATION: Neuropathy in B feet   MUSCLE LENGTH: Marked HS R>L, hip flexor tightness   POSTURE: rounded shoulders and forward head  LOWER EXTREMITY ROM: WFL for tasks assessed  LOWER EXTREMITY MMT:  MMT Right eval Left eval 4/1  5/27 9/4  Hip flexion 4 4 4  Bil 4 Bil 4 Bil  Hip extension Able to bridge Able to bridge 4- Bil 4- Bil 4- Bil  Hip abduction 4-* 4-* 4- Bil 4- Bil 4- Bil  Hip adduction       Hip internal rotation       Hip external rotation       Knee flexion       Knee extension 4 4+ 4- Bil 4- Bil 4- Bil  Ankle dorsiflexion 4+ 4- 4- Bil 4- Bil 4- Bil  Ankle plantarflexion       Ankle inversion       Ankle eversion        (Blank rows = not tested)  3/20:  max UE assist to rise sit to stand; LE hip extension, knee extension grossly 3+ to 4-/5  FUNCTIONAL TESTS:  30 seconds chair stand test Timed up and go (TUG): 59.36 with 4WW Stance time: 45 sec  patient reports unsteadiness and feels he goes backwards 07/27/2023 92 ft decreased cadence; decreased step length; absent heel strike  12/26: 5x STS: unable to rise without heavy UE use; attempted to use armrests for test but does not come all the way up;  next trial with chair +cushion to touch at least one RW handle 48.2 sec:   TUG: cushion in seat +arm use and RW:  52.49 sec  08/13/23 attempted 5x STS but pt unable to complete: 1st rep does not come all the way up and then plops heavily into the transport wheelchair (he then recounts how this happened at home and how the chair tipped backwards) unable to complete  reps Unable to complete TUG today, he does not have his 4 wheeled walker today and feels he is slower with the clinic RW  09/15/2023 TUG: 1:03 with rollator 5STS:48.55 sec heavy reliance on UE support; last repetition was a challenge  3/20: Heavy UE reliance with STS (unable to push up from [facility] armrests, pulls up from // bars) 4/1: max UE assist with sit to stand  5/27:  5x STS no cushion: able to complete 4 reps 47.19 sec            5x STS with cushion 22.5 seat height 5x 43.09 sec able to push up from [facility] armrests rather than // bars (much less reliance on Ues to rise)  7/24: 5x STS with cushion 53.30 needs UE assist from [facility], 2nd trial 32.22 push from [facility] armrests  04/06/2024 5STS: 25.38 sec with UE support pushing from armrest (w/c + airex pad)  04/21/2024 5STS:23.54 sec max UE support (last rep was challenging)  GAIT: Distance walked: 25 Assistive device utilized: Walker - 4 wheeled Level of assistance: Modified independence Comments: slow speed, decreased step and stride, decreased heel strike, forward trunk lean.  TRANSFERS: Sit to stand: Uses walker to pull up; stand to sit: does not fully back up to chair and does not reach back; Supine to sit: needs min A for log roll  (has some back pain with sit <>supine due to sitting upward from supine) also fear of falling  from bed, sit to supine needs min A to lift legs on to mat table The Patient-Specific Functional Scale  Initial:  I am going to ask you to identify up to 3 important activities that you are unable to do or are having difficulty with as a result of this problem.  Today are there any activities that you are unable to do or having difficulty with because of this?  (Patient shown scale and patient rated each activity)  Follow up: When you first came in you had difficulty performing these activities.  Today do you still have difficulty?  Patient-Specific activity scoring scheme (Point to one number):  0 1 2 3 4 5 6 7 8 9  10 Unable                                                                                                          Able to perform To perform                                                                                                    activity at the same Activity         Level as before  Injury or problem  Activity            Getting out of the recliner                                                                  Initial:                    5/27:  4    7/24: 4-5   8/27: 6      9/11: 5  2.                   Walking with RW 40 feet                                                              Initial:1                                 7/24: 6-7    8/27: 6      9/11:6  3.                    Standing to brush teeth                                                           Initial:       0 (has to do sitting on shower chair)     5/27: 3       7/24: 4-5   8/27: 0(sits in shower chair)    9/11:6(able to stand but he gets fatigued. He keeps a shower chair behind him)  4. Putting on socks and shoes  while sitting                                                      initial 2-3              7/24:   5   8/27: 0(his brother helps him)    9/11: 0 (His brother helps  him)  TODAY'S TREATMENT:  04/21/24: Nu-Step L5 6 min UE/LE while discussing status Goal Assessment & PSFS Update (see above)  5STS: 23.54 sec max UE support (last rep was challenging) Gait training in // with gait belt x 2  laps with emphasis on longer step length and upright posture, able to do several laps without UE use on railing but needs UE support for turning around  Walk in // bars with single side 5# KB hold 2 laps to simulate carrying a shopping bag Seated biceps curl at dual cable 10# x 10 Seated row at dual able 10# x 10 Seated pallof press wit  green tb x 10 each direction  Seated shoulder abduction with green TB x 10     04/19/24: Nu-Step L5 7 min UE/LE while discussing status; last 2 minutes challenged pt to keep at 50 spm  Sit to stand pushing from transport chair 5x 5# Kettlebell rows to simulate picking up his cat 10x right and left without UE support Walk in // bars with single side 5# KB hold 4 laps to simulate carrying a shopping bag Dynamic balance: Static standing with UE reaches in multi planes, trunk rotation, high stepping Gait training in // with gait belt x 10  laps with emphasis on longer step length and upright posture, able to do several laps without UE use on railing but needs UE support for turning around Backwards walking in // bars 2 laps needs bil UE support Lat pulls 10# at dual cable (higher anchor point)  2 x 10  04/14/24: Nu-Step L5 7 min UE/LE while discussing status (37 spm, slower but longer duration) Sit to stand pushing from transport chair Step over red band line on floor for increased step length 5x right/left  4 inch forward step up 5x right/left Foot prop on 4 inch step with head turns, UE reaching in multi planes Dynamic balance: Static standing with UE reaches in multi planes, head turns Gait training in // with gait belt x 12  laps with emphasis on longer step length and upright posture, able to do several laps without UE use on railing  but needs UE support for turning around Backwards walking in // bars 3 laps needs bil UE support Lat pulls 10# at dual cable (higher anchor point)  2 x 10  04/06/24: 5STS: 25.38 sec with UE support pushing form armrest (w/c + airex pad) Goals assessment & see PSFS assessment above Gait training in // with gait belt x 10/11  laps with emphasis on longer step length and upright posture, able to do several laps without UE use on railing but needs UE support for turning around. Required seated rest break and them ambulated  Seated biceps curl at dual cable 10# x 10 Patient was fatigued at this point and requested to leave     PATIENT EDUCATION:  Education details: PT eval findings, anticipated POC, initial HEP, postural awareness, and transfer safety  Person educated: Patient and brother Education method: Explanation, Demonstration, and Handouts Education comprehension: verbalized understanding and returned demonstration  HOME EXERCISE PROGRAM: Access Code: [email or web address] URL: [email or web address] Date: 09/15/2023 Prepared by: Kristeen Sar  Exercises - Sit to Stand with Counter Support  - 3 x daily - 3-4 x weekly - 1 sets - 1-5 reps - Hooklying Clamshell with Resistance  - 2 x daily - 3-4 x weekly - 1 sets - 10 reps - Seated Long Arc Quad  - 1 x daily - 7 x weekly - 1-3 sets - 10 reps - 5 sec hold - Standing Hip Abduction with Counter Support  - 1 x daily - 7 x weekly - 1-2 sets - 10 reps - Standing March with Counter Support  - 1 x daily - 7 x weekly - 1-2 sets - 10 reps - Standing Heel Raise with Support  - 1 x daily - 7 x weekly - 1-2 sets - 10 reps - Shoulder External Rotation and Scapular Retraction with Resistance  - 1 x daily - 7 x weekly - 2 sets - 10 reps - Sit to Stand with Armchair  - 1 x daily - 7 x weekly -  1 sets - 5 reps  Patient Education - Tips to reduce freezing episodes with standing or walking   ASSESSMENT:  CLINICAL IMPRESSION: Re-certification  completed today. Mamie's mobility varies in between each treatment session. Some sessions he is able to take good steps with a good cadence/ step length, and other session he has increased episodes of freezing. Since last re-assessment he reports he has been trying to stand more while brushing his teeth. He keeps a shower chair behind him just incase he has to sit. After 2 minutes he normally has to sit down due to fatigue. His main goal is to be able to walk freely and without any assistance devices. PT frequently educates patient to re frame his thinking and see his walker has a device that is giving him independence and promoting safety. PT provides close guarding when patient ambulates in // bars for safety. Today, when walking with the weight he was an episode of freezing and almost lost his balance anteriorly. Patient benefits from skilled PT to manage chronic endurance deficits from associated illness. Without skilled therapy patient's functional independence will decline.      OBJECTIVE IMPAIRMENTS: Abnormal gait, decreased balance, decreased ROM, decreased strength, decreased safety awareness, dizziness, impaired flexibility, impaired sensation, postural dysfunction, and pain.   ACTIVITY LIMITATIONS: carrying, lifting, bending, standing, squatting, stairs, transfers, bed mobility, continence, bathing, and locomotion level  PARTICIPATION LIMITATIONS: shopping and community activity  PERSONAL FACTORS: Age, Fitness, Time since onset of injury/illness/exacerbation, and 3+ comorbidities: Compression fx L1 2022, CAD, CKD (stage 4), neuropathy, retinopathy are also affecting patient's functional outcome.   REHAB POTENTIAL: Good  CLINICAL DECISION MAKING: Unstable/unpredictable  EVALUATION COMPLEXITY: High   GOALS: Goals reviewed with patient? Yes  SHORT TERM GOALS: Target date: 07/30/23 Improved TUG to < 25 sec to decrease fall risk.  Baseline: Goal status: deferred  2.  Able to  perform 5XSTS without UE assist and controlled descent showing improved LE strength.  Baseline:  Goal status: deferred 3.  Patient will be able to stand safely for 2 min without UE assist Baseline:  Goal status: met 5/27  LONG TERM GOALS: Target date: 06/16/2024  Ind with advanced HEP Baseline:  Goal status: IN PROGRESS   2.  Sit to stand from the recliner with PSFS score of 5 Baseline:  Goal status:  met 7/24  3.  Gait with RW from the bedroom to the sunroom 40-50 feet with PSFS of 3 Baseline:  Goal status: met 7/24  4. Improved LE mobility needed for greater ease with putting on socks and shoes in sitting with PSFS of 5 Baseline:  Goal status: met 7/24  5.  Patient will be able to stand long enough to brush teeth PSFS of 2 Baseline:  Goal status: met 5/27  6. Able to rise from 22 inch seat height push from the armrests to use the new chairs in the living room (in July) Goal status: met 7/24  7. Patient will be able to stand for 12 minutes needed for grooming and dressing tasks at home    Goal status: ongoing 04/06/2024                      8. The patient will have improved LE strength with 5x STS test with UE assist improved to 25 sec MET 8/27/225                       9. The patient will be able  to walk for 6 min with UE support with RW or // bars                           Ongoing 04/06/2024  PLAN:  PT FREQUENCY: 2x week  PT DURATION: 8 weeks  PLANNED INTERVENTIONS: 97164- PT Re-evaluation, 97110-Therapeutic exercises, 97530- Therapeutic activity, 97112- Neuromuscular re-education, 97535- Self Care, [id number]- Manual therapy, [phone]- Gait training, [phone]- Aquatic Therapy, [phone]- Electrical stimulation (unattended), Patient/Family education, Balance training, Stair training, Taping, Joint mobilization, Spinal mobilization, DME instructions, Cryotherapy, and Moist heat  PLAN FOR NEXT SESSION:Nu-Step; dual cable rows;  sit to stand from foam pad; static standing with teal  power cord isometric trunk extension;  UE movements while standing for ADLs; gait in // bars working on standing erect, longer step lengths;  assess energy levels and endurance ; functional strengthening; pt has a goal to lift/carry his cat  Kristeen Sar, PT 04/21/24 3:03 PM

## 2024-04-23 ENCOUNTER — Other Ambulatory Visit: Payer: Self-pay | Admitting: Family Medicine

## 2024-04-24 ENCOUNTER — Other Ambulatory Visit: Payer: Self-pay | Admitting: Family Medicine

## 2024-04-26 ENCOUNTER — Ambulatory Visit: Admitting: Physical Therapy

## 2024-04-26 ENCOUNTER — Encounter: Payer: Self-pay | Admitting: Physical Therapy

## 2024-04-26 DIAGNOSIS — R262 Difficulty in walking, not elsewhere classified: Secondary | ICD-10-CM

## 2024-04-26 DIAGNOSIS — R2689 Other abnormalities of gait and mobility: Secondary | ICD-10-CM

## 2024-04-26 DIAGNOSIS — M6281 Muscle weakness (generalized): Secondary | ICD-10-CM

## 2024-04-26 NOTE — Therapy (Signed)
 OUTPATIENT PHYSICAL THERAPY LOWER EXTREMITY TREATMENT    Patient Name: Jeffrey [name] MRN: [id number] DOB:1955/11/22, 68 y.o., male Today's Date: 04/26/2024    END OF SESSION:  PT End of Session - 04/26/24 1358     Visit Number 54    Date for PT Re-Evaluation 06/16/24    Authorization Type HTA    Progress Note Due on Visit 60    PT Start Time 1400    PT Stop Time 1443    PT Time Calculation (min) 43 min    Equipment Utilized During Treatment Gait belt    Activity Tolerance Patient tolerated treatment well                     Past Medical History:  Diagnosis Date   Cardiac arrest ([name]) 05/22/2016   Cataract    CHF (congestive heart failure) ([name])    CKD (chronic kidney disease) stage 3, GFR 30-59 ml/min ([name]) 05/28/2021   CKD (chronic kidney disease) stage 4, GFR 15-29 ml/min ([name]) 05/28/2021   Diabetes mellitus without complication ([name])    type 2   Diabetic retinopathy ([name])    Hyperlipidemia    Hypertension    Memory loss    mild   Myocardial infarct ([name]) 2105   Retinopathy    Both eyes   S/P primary angioplasty with coronary stent 05/22/2016   Sleep-disordered breathing 12/31/2023   Vitamin B12 deficiency    Vitreous hemorrhage of left eye ([name])    proliferative diabetic retinopathy   Past Surgical History:  Procedure Laterality Date   ANGIOPLASTY     2015   COLONOSCOPY     multiple   EYE SURGERY     PARS PLANA VITRECTOMY Left 03/15/2020   Procedure: PARS PLANA VITRECTOMY WITH 25 GAUGE;  Surgeon: Jarold Mayo, MD;  Location: [facility] OR;  Service: Ophthalmology;  Laterality: Left;   PHOTOCOAGULATION WITH LASER Left 03/15/2020   Procedure: PHOTOCOAGULATION WITH LASER; INTRAVITREAL INJECTION OF AVASTIN ;  Surgeon: Jarold Mayo, MD;  Location: [facility] OR;  Service: Ophthalmology;  Laterality: Left;   REFRACTIVE SURGERY     UPPER GI ENDOSCOPY     several   VITRECTOMY     Patient Active Problem List   Diagnosis Date Noted   Sleep-disordered breathing  12/31/2023   Actinic keratosis 11/30/2023   Vitamin D  deficiency 11/30/2023   Rhinitis 05/20/2023   Cervical spondylosis 08/26/2022   CKD (chronic kidney disease) stage 4, GFR 15-29 ml/min ([name]) 05/28/2021   Adjustment disorder 01/22/2021   Chronic back pain 10/22/2020   Peripheral vertigo 05/15/2020   Dermatitis 01/04/2020   Unintentional weight loss with loose stools 05/19/2019   Low vitamin B12 level 03/29/2019   Heme positive stool 03/14/2019   Normocytic anemia 03/14/2019   Chronic diastolic heart failure ([name]) 02/08/2019   Diabetic retinopathy ([name]) 09/08/2018   Physical debility 05/24/2018   Varicose veins of both lower extremities 03/01/2018   Fatigue due to exertion 10/14/2017   GERD (gastroesophageal reflux disease) 08/25/2017   CAD in native artery 04/14/2017   Hyperlipidemia associated with type 2 diabetes mellitus ([name]) 04/14/2017   Diabetic peripheral neuropathy associated with type 2 diabetes mellitus ([name]) 08/17/2016   [name] 05/02/2016   Hypertension associated with diabetes ([name]) 05/02/2016    PCP: Kennyth Worth HERO, MD   REFERRING PROVIDER: Kennyth Worth HERO, MD   REFERRING DIAG: R53.81 (ICD-10-CM) - Physical debility   THERAPY DIAG:  Other abnormalities of gait and mobility  Difficulty in  walking, not elsewhere classified  Muscle weakness (generalized)  Rationale for Evaluation and Treatment: Rehabilitation  ONSET DATE: 2022  SUBJECTIVE:   SUBJECTIVE STATEMENT: Nothing new to report.  Hasn't heard when spinal tap will be yet.   Arrives via transport chair assisted by [name]  Brother: [name] identical twins  PERTINENT HISTORY: 3/11: evidence of lumbar compression deformity Compression fx L1 2022, CAD, CKD (stage 4), DM, cardiac arrest 2017, neuropathy, retinopathy Long history of gait abnormality beginning back in 2022. He has a history of CAD and believes that his debility began following a covid infection. In  December odf 2022 he fell and suffered a compression fracture of L1.  PAIN:  Are you having pain? no  PRECAUTIONS: Fall  RED FLAGS: None   WEIGHT BEARING RESTRICTIONS: No  FALLS:  Has patient fallen in last 6 months? No and with sit to stand falls back sometimes  LIVING ENVIRONMENT: Lives with: lives with their family Lives in: House/apartment Stairs: No Has following equipment at home: Single point cane, Environmental consultant - 2 wheeled, Environmental consultant - 4 wheeled, shower chair, Grab bars, and stand up walker also; also has shower chair at sink for shaving, brushing teeth etc, also has transporter.  OCCUPATION: retired  PLOF: Independent with household mobility with device  PATIENT GOALS: to walk without a walker  NEXT MD VISIT: 09/28/22  OBJECTIVE:  Note: Objective measures were completed at Evaluation unless otherwise noted.  DIAGNOSTIC FINDINGS: N/A  COGNITION: Overall cognitive status: Within functional limits for tasks assessed     SENSATION: Neuropathy in B feet   MUSCLE LENGTH: Marked HS R>L, hip flexor tightness   POSTURE: rounded shoulders and forward head  LOWER EXTREMITY ROM: WFL for tasks assessed  LOWER EXTREMITY MMT:  MMT Right eval Left eval 4/1  5/27 9/4  Hip flexion 4 4 4  Bil 4 Bil 4 Bil  Hip extension Able to bridge Able to bridge 4- Bil 4- Bil 4- Bil  Hip abduction 4-* 4-* 4- Bil 4- Bil 4- Bil  Hip adduction       Hip internal rotation       Hip external rotation       Knee flexion       Knee extension 4 4+ 4- Bil 4- Bil 4- Bil  Ankle dorsiflexion 4+ 4- 4- Bil 4- Bil 4- Bil  Ankle plantarflexion       Ankle inversion       Ankle eversion        (Blank rows = not tested)  3/20:  max UE assist to rise sit to stand; LE hip extension, knee extension grossly 3+ to 4-/5  FUNCTIONAL TESTS:  30 seconds chair stand test Timed up and go (TUG): 59.36 with 4WW Stance time: 45 sec patient reports unsteadiness and feels he goes backwards 07/27/2023 92 ft  decreased cadence; decreased step length; absent heel strike  12/26: 5x STS: unable to rise without heavy UE use; attempted to use armrests for test but does not come all the way up;  next trial with chair +cushion to touch at least one RW handle 48.2 sec:   TUG: cushion in seat +arm use and RW:  52.49 sec  08/13/23 attempted 5x STS but pt unable to complete: 1st rep does not come all the way up and then plops heavily into the transport wheelchair (he then recounts how this happened at home and how the chair tipped backwards) unable to complete reps Unable to complete TUG today, he does not  have his 4 wheeled walker today and feels he is slower with the clinic RW  09/15/2023 TUG: 1:03 with rollator 5STS:48.55 sec heavy reliance on UE support; last repetition was a challenge  3/20: Heavy UE reliance with STS (unable to push up from [facility] armrests, pulls up from // bars) 4/1: max UE assist with sit to stand  5/27:  5x STS no cushion: able to complete 4 reps 47.19 sec            5x STS with cushion 22.5 seat height 5x 43.09 sec able to push up from [facility] armrests rather than // bars (much less reliance on Ues to rise)  7/24: 5x STS with cushion 53.30 needs UE assist from [facility], 2nd trial 32.22 push from [facility] armrests  04/06/2024 5STS: 25.38 sec with UE support pushing from armrest (w/c + airex pad)  04/21/2024 5STS:23.54 sec max UE support (last rep was challenging)  GAIT: Distance walked: 25 Assistive device utilized: Walker - 4 wheeled Level of assistance: Modified independence Comments: slow speed, decreased step and stride, decreased heel strike, forward trunk lean.  TRANSFERS: Sit to stand: Uses walker to pull up; stand to sit: does not fully back up to chair and does not reach back; Supine to sit: needs min A for log roll  (has some back pain with sit <>supine due to sitting upward from supine) also fear of falling from bed, sit to supine needs min A to lift legs on to mat table The  Patient-Specific Functional Scale  Initial:  I am going to ask you to identify up to 3 important activities that you are unable to do or are having difficulty with as a result of this problem.  Today are there any activities that you are unable to do or having difficulty with because of this?  (Patient shown scale and patient rated each activity)  Follow up: When you first came in you had difficulty performing these activities.  Today do you still have difficulty?  Patient-Specific activity scoring scheme (Point to one number):  0 1 2 3 4 5 6 7 8 9  10 Unable                                                                                                          Able to perform To perform                                                                                                    activity at the same Activity         Level as before  Injury or problem  Activity            Getting out of the recliner                                                                  Initial:                    5/27:  4    7/24: 4-5   8/27: 6      9/11: 5  2.                   Walking with RW 40 feet                                                              Initial:1                                 7/24: 6-7    8/27: 6      9/11:6  3.                    Standing to brush teeth                                                           Initial:       0 (has to do sitting on shower chair)     5/27: 3       7/24: 4-5   8/27: 0(sits in shower chair)    9/11:6(able to stand but he gets fatigued. He keeps a shower chair behind him)  4. Putting on socks and shoes  while sitting                                                      initial 2-3              7/24:   5   8/27: 0(his brother helps him)    9/11: 0 (His brother helps him)  TODAY'S TREATMENT:  04/26/24: Nu-Step L5 6 min UE/LE while  discussing status (cues to keep SPM above 45) Sit to stand pushing from transport chair 5x 5# Kettlebell rows to simulate picking up his cat 10x right and left without UE support Walk in // bars with single side 5# KB hold 4 laps to simulate carrying a shopping bag Dynamic balance: Static standing with UE reaches in multi planes, trunk rotation, high stepping Blue band trunk extension 5 sec holds 10x (anchored by therapist) Blue band at waist level anchored on // bars with resisted walk forward against band 10x Gait training in // with gait  belt x 10  laps with emphasis on longer step length and upright posture, able to do several laps without UE use on railing but needs UE support for turning around Backwards walking in // bars 2 laps needs bil UE support Lat pulls 10# at dual cable (higher anchor point)  2 x 10 LAQ 4# 12x; increased to 6# 10x right/left    04/21/24: Nu-Step L5 6 min UE/LE while discussing status Goal Assessment & PSFS Update (see above)  5STS: 23.54 sec max UE support (last rep was challenging) Gait training in // with gait belt x 2  laps with emphasis on longer step length and upright posture, able to do several laps without UE use on railing but needs UE support for turning around  Walk in // bars with single side 5# KB hold 2 laps to simulate carrying a shopping bag Seated biceps curl at dual cable 10# x 10 Seated row at dual able 10# x 10 Seated pallof press wit green tb x 10 each direction  Seated shoulder abduction with green TB x 10     04/19/24: Nu-Step L5 7 min UE/LE while discussing status; last 2 minutes challenged pt to keep at 50 spm  Sit to stand pushing from transport chair 5x 5# Kettlebell rows to simulate picking up his cat 10x right and left without UE support Walk in // bars with single side 5# KB hold 4 laps to simulate carrying a shopping bag Dynamic balance: Static standing with UE reaches in multi planes, trunk rotation, high stepping Gait  training in // with gait belt x 10  laps with emphasis on longer step length and upright posture, able to do several laps without UE use on railing but needs UE support for turning around Backwards walking in // bars 2 laps needs bil UE support Lat pulls 10# at dual cable (higher anchor point)  2 x 10    PATIENT EDUCATION:  Education details: PT eval findings, anticipated POC, initial HEP, postural awareness, and transfer safety  Person educated: Patient and brother Education method: Explanation, Demonstration, and Handouts Education comprehension: verbalized understanding and returned demonstration  HOME EXERCISE PROGRAM: Access Code: [email or web address] URL: [email or web address] Date: 09/15/2023 Prepared by: Kristeen Sar  Exercises - Sit to Stand with Counter Support  - 3 x daily - 3-4 x weekly - 1 sets - 1-5 reps - Hooklying Clamshell with Resistance  - 2 x daily - 3-4 x weekly - 1 sets - 10 reps - Seated Long Arc Quad  - 1 x daily - 7 x weekly - 1-3 sets - 10 reps - 5 sec hold - Standing Hip Abduction with Counter Support  - 1 x daily - 7 x weekly - 1-2 sets - 10 reps - Standing March with Counter Support  - 1 x daily - 7 x weekly - 1-2 sets - 10 reps - Standing Heel Raise with Support  - 1 x daily - 7 x weekly - 1-2 sets - 10 reps - Shoulder External Rotation and Scapular Retraction with Resistance  - 1 x daily - 7 x weekly - 2 sets - 10 reps - Sit to Stand with Armchair  - 1 x daily - 7 x weekly - 1 sets - 5 reps  Patient Education - Tips to reduce freezing episodes with standing or walking   ASSESSMENT:  CLINICAL IMPRESSION: The patient reports he feels exhausted at the end of the session but states he likes pushing himself and felt today was a  good session.  He has difficulty with the hip hinge maneuver despite verbal cues (tends to flex at the spine causing back pain).  He fatigues quickly with sit to stand with heavy reliance on Ues on the 5th repetition.  Therapist  monitoring fatigue level and pain as well as for CGA for safety when transferring and ambulating.     OBJECTIVE IMPAIRMENTS: Abnormal gait, decreased balance, decreased ROM, decreased strength, decreased safety awareness, dizziness, impaired flexibility, impaired sensation, postural dysfunction, and pain.   ACTIVITY LIMITATIONS: carrying, lifting, bending, standing, squatting, stairs, transfers, bed mobility, continence, bathing, and locomotion level  PARTICIPATION LIMITATIONS: shopping and community activity  PERSONAL FACTORS: Age, Fitness, Time since onset of injury/illness/exacerbation, and 3+ comorbidities: Compression fx L1 2022, CAD, CKD (stage 4), neuropathy, retinopathy are also affecting patient's functional outcome.   REHAB POTENTIAL: Good  CLINICAL DECISION MAKING: Unstable/unpredictable  EVALUATION COMPLEXITY: High   GOALS: Goals reviewed with patient? Yes  SHORT TERM GOALS: Target date: 07/30/23 Improved TUG to < 25 sec to decrease fall risk.  Baseline: Goal status: deferred  2.  Able to perform 5XSTS without UE assist and controlled descent showing improved LE strength.  Baseline:  Goal status: deferred 3.  Patient will be able to stand safely for 2 min without UE assist Baseline:  Goal status: met 5/27  LONG TERM GOALS: Target date: 06/16/2024  Ind with advanced HEP Baseline:  Goal status: IN PROGRESS   2.  Sit to stand from the recliner with PSFS score of 5 Baseline:  Goal status:  met 7/24  3.  Gait with RW from the bedroom to the sunroom 40-50 feet with PSFS of 3 Baseline:  Goal status: met 7/24  4. Improved LE mobility needed for greater ease with putting on socks and shoes in sitting with PSFS of 5 Baseline:  Goal status: met 7/24  5.  Patient will be able to stand long enough to brush teeth PSFS of 2 Baseline:  Goal status: met 5/27  6. Able to rise from 22 inch seat height push from the armrests to use the new chairs in the living room (in  July) Goal status: met 7/24  7. Patient will be able to stand for 12 minutes needed for grooming and dressing tasks at home    Goal status: ongoing 04/06/2024                      8. The patient will have improved LE strength with 5x STS test with UE assist improved to 25 sec MET 8/27/225                       9. The patient will be able to walk for 6 min with UE support with RW or // bars                           Ongoing 04/06/2024  PLAN:  PT FREQUENCY: 2x week  PT DURATION: 8 weeks  PLANNED INTERVENTIONS: 97164- PT Re-evaluation, 97110-Therapeutic exercises, 97530- Therapeutic activity, 97112- Neuromuscular re-education, 97535- Self Care, [id number]- Manual therapy, 97116- Gait training, [phone]- Aquatic Therapy, 97014- Electrical stimulation (unattended), Patient/Family education, Balance training, Stair training, Taping, Joint mobilization, Spinal mobilization, DME instructions, Cryotherapy, and Moist heat  PLAN FOR NEXT SESSION:Nu-Step; dual cable rows;  sit to stand from foam pad; static standing with teal power cord isometric trunk extension;  UE movements while standing for ADLs; gait in //  bars working on standing erect, longer step lengths;  assess energy levels and endurance ; functional strengthening; pt has a goal to lift/carry his cat  Glade Pesa, PT 04/26/24 5:05 PM Phone: [phone] Fax: [phone]

## 2024-04-27 ENCOUNTER — Telehealth: Payer: Self-pay

## 2024-04-27 ENCOUNTER — Other Ambulatory Visit: Payer: Self-pay

## 2024-04-27 DIAGNOSIS — R269 Unspecified abnormalities of gait and mobility: Secondary | ICD-10-CM

## 2024-04-27 NOTE — Telephone Encounter (Signed)
 Called pt and give number to call [facility] for schedule of LP. Per LaKisha Cobb. He understood will call.

## 2024-04-27 NOTE — Telephone Encounter (Signed)
 Patient has been approval for LP Authorization Number [id number]

## 2024-04-28 ENCOUNTER — Other Ambulatory Visit: Payer: Self-pay | Admitting: Radiology

## 2024-04-28 ENCOUNTER — Ambulatory Visit: Admitting: Physical Therapy

## 2024-04-28 DIAGNOSIS — R2689 Other abnormalities of gait and mobility: Secondary | ICD-10-CM

## 2024-04-28 DIAGNOSIS — R262 Difficulty in walking, not elsewhere classified: Secondary | ICD-10-CM

## 2024-04-28 DIAGNOSIS — M6281 Muscle weakness (generalized): Secondary | ICD-10-CM

## 2024-04-28 NOTE — Therapy (Signed)
 OUTPATIENT PHYSICAL THERAPY LOWER EXTREMITY TREATMENT    Patient Name: Jeffrey [name] MRN: [id number] DOB:Mar 05, 1956, 68 y.o., male Today's Date: 04/28/2024    END OF SESSION:  PT End of Session - 04/28/24 1403     Visit Number 55    Date for Recertification  06/16/24    Authorization Type HTA    Progress Note Due on Visit 60    PT Start Time 1401    PT Stop Time 1443    PT Time Calculation (min) 42 min    Equipment Utilized During Treatment Gait belt    Activity Tolerance Patient tolerated treatment well                     Past Medical History:  Diagnosis Date   Cardiac arrest ([name]) 05/22/2016   Cataract    CHF (congestive heart failure) ([name])    CKD (chronic kidney disease) stage 3, GFR 30-59 ml/min ([name]) 05/28/2021   CKD (chronic kidney disease) stage 4, GFR 15-29 ml/min ([name]) 05/28/2021   Diabetes mellitus without complication ([name])    type 2   Diabetic retinopathy ([name])    Hyperlipidemia    Hypertension    Memory loss    mild   Myocardial infarct ([name]) 2105   Retinopathy    Both eyes   S/P primary angioplasty with coronary stent 05/22/2016   Sleep-disordered breathing 12/31/2023   Vitamin B12 deficiency    Vitreous hemorrhage of left eye ([name])    proliferative diabetic retinopathy   Past Surgical History:  Procedure Laterality Date   ANGIOPLASTY     2015   COLONOSCOPY     multiple   EYE SURGERY     PARS PLANA VITRECTOMY Left 03/15/2020   Procedure: PARS PLANA VITRECTOMY WITH 25 GAUGE;  Surgeon: Jarold Mayo, MD;  Location: [facility] OR;  Service: Ophthalmology;  Laterality: Left;   PHOTOCOAGULATION WITH LASER Left 03/15/2020   Procedure: PHOTOCOAGULATION WITH LASER; INTRAVITREAL INJECTION OF AVASTIN ;  Surgeon: Jarold Mayo, MD;  Location: [facility] OR;  Service: Ophthalmology;  Laterality: Left;   REFRACTIVE SURGERY     UPPER GI ENDOSCOPY     several   VITRECTOMY     Patient Active Problem List   Diagnosis Date Noted   Sleep-disordered breathing  12/31/2023   Actinic keratosis 11/30/2023   Vitamin D  deficiency 11/30/2023   Rhinitis 05/20/2023   Cervical spondylosis 08/26/2022   CKD (chronic kidney disease) stage 4, GFR 15-29 ml/min ([name]) 05/28/2021   Adjustment disorder 01/22/2021   Chronic back pain 10/22/2020   Peripheral vertigo 05/15/2020   Dermatitis 01/04/2020   Unintentional weight loss with loose stools 05/19/2019   Low vitamin B12 level 03/29/2019   Heme positive stool 03/14/2019   Normocytic anemia 03/14/2019   Chronic diastolic heart failure ([name]) 02/08/2019   Diabetic retinopathy ([name]) 09/08/2018   Physical debility 05/24/2018   Varicose veins of both lower extremities 03/01/2018   Fatigue due to exertion 10/14/2017   GERD (gastroesophageal reflux disease) 08/25/2017   CAD in native artery 04/14/2017   Hyperlipidemia associated with type 2 diabetes mellitus ([name]) 04/14/2017   Diabetic peripheral neuropathy associated with type 2 diabetes mellitus ([name]) 08/17/2016   [name] 05/02/2016   Hypertension associated with diabetes ([name]) 05/02/2016    PCP: Kennyth Worth HERO, MD   REFERRING PROVIDER: Kennyth Worth HERO, MD   REFERRING DIAG: R53.81 (ICD-10-CM) - Physical debility   THERAPY DIAG:  Other abnormalities of gait and mobility  Difficulty in  walking, not elsewhere classified  Muscle weakness (generalized)  Rationale for Evaluation and Treatment: Rehabilitation  ONSET DATE: 2022  SUBJECTIVE:   SUBJECTIVE STATEMENT: Having spinal tap procedure next Friday. Went home after last time and had a nap.     Arrives via transport chair assisted by [name]  Brother: [name] identical twins  PERTINENT HISTORY: 3/11: evidence of lumbar compression deformity Compression fx L1 2022, CAD, CKD (stage 4), DM, cardiac arrest 2017, neuropathy, retinopathy Long history of gait abnormality beginning back in 2022. He has a history of CAD and believes that his debility began following a covid  infection. In December odf 2022 he fell and suffered a compression fracture of L1.  PAIN:  Are you having pain? no  PRECAUTIONS: Fall  RED FLAGS: None   WEIGHT BEARING RESTRICTIONS: No  FALLS:  Has patient fallen in last 6 months? No and with sit to stand falls back sometimes  LIVING ENVIRONMENT: Lives with: lives with their family Lives in: House/apartment Stairs: No Has following equipment at home: Single point cane, Environmental consultant - 2 wheeled, Environmental consultant - 4 wheeled, shower chair, Grab bars, and stand up walker also; also has shower chair at sink for shaving, brushing teeth etc, also has transporter.  OCCUPATION: retired  PLOF: Independent with household mobility with device  PATIENT GOALS: to walk without a walker  NEXT MD VISIT: 09/28/22  OBJECTIVE:  Note: Objective measures were completed at Evaluation unless otherwise noted.  DIAGNOSTIC FINDINGS: N/A  COGNITION: Overall cognitive status: Within functional limits for tasks assessed     SENSATION: Neuropathy in B feet   MUSCLE LENGTH: Marked HS R>L, hip flexor tightness   POSTURE: rounded shoulders and forward head  LOWER EXTREMITY ROM: WFL for tasks assessed  LOWER EXTREMITY MMT:  MMT Right eval Left eval 4/1  5/27 9/4  Hip flexion 4 4 4  Bil 4 Bil 4 Bil  Hip extension Able to bridge Able to bridge 4- Bil 4- Bil 4- Bil  Hip abduction 4-* 4-* 4- Bil 4- Bil 4- Bil  Hip adduction       Hip internal rotation       Hip external rotation       Knee flexion       Knee extension 4 4+ 4- Bil 4- Bil 4- Bil  Ankle dorsiflexion 4+ 4- 4- Bil 4- Bil 4- Bil  Ankle plantarflexion       Ankle inversion       Ankle eversion        (Blank rows = not tested)  3/20:  max UE assist to rise sit to stand; LE hip extension, knee extension grossly 3+ to 4-/5  FUNCTIONAL TESTS:  30 seconds chair stand test Timed up and go (TUG): 59.36 with 4WW Stance time: 45 sec patient reports unsteadiness and feels he goes  backwards 07/27/2023 92 ft decreased cadence; decreased step length; absent heel strike  12/26: 5x STS: unable to rise without heavy UE use; attempted to use armrests for test but does not come all the way up;  next trial with chair +cushion to touch at least one RW handle 48.2 sec:   TUG: cushion in seat +arm use and RW:  52.49 sec  08/13/23 attempted 5x STS but pt unable to complete: 1st rep does not come all the way up and then plops heavily into the transport wheelchair (he then recounts how this happened at home and how the chair tipped backwards) unable to complete reps Unable to complete TUG  today, he does not have his 4 wheeled walker today and feels he is slower with the clinic RW  09/15/2023 TUG: 1:03 with rollator 5STS:48.55 sec heavy reliance on UE support; last repetition was a challenge  3/20: Heavy UE reliance with STS (unable to push up from [facility] armrests, pulls up from // bars) 4/1: max UE assist with sit to stand  5/27:  5x STS no cushion: able to complete 4 reps 47.19 sec            5x STS with cushion 22.5 seat height 5x 43.09 sec able to push up from [facility] armrests rather than // bars (much less reliance on Ues to rise)  7/24: 5x STS with cushion 53.30 needs UE assist from [facility], 2nd trial 32.22 push from [facility] armrests  04/06/2024 5STS: 25.38 sec with UE support pushing from armrest (w/c + airex pad)  04/21/2024 5STS:23.54 sec max UE support (last rep was challenging)  GAIT: Distance walked: 25 Assistive device utilized: Walker - 4 wheeled Level of assistance: Modified independence Comments: slow speed, decreased step and stride, decreased heel strike, forward trunk lean.  TRANSFERS: Sit to stand: Uses walker to pull up; stand to sit: does not fully back up to chair and does not reach back; Supine to sit: needs min A for log roll  (has some back pain with sit <>supine due to sitting upward from supine) also fear of falling from bed, sit to supine needs min A to lift legs  on to mat table The Patient-Specific Functional Scale  Initial:  I am going to ask you to identify up to 3 important activities that you are unable to do or are having difficulty with as a result of this problem.  Today are there any activities that you are unable to do or having difficulty with because of this?  (Patient shown scale and patient rated each activity)  Follow up: When you first came in you had difficulty performing these activities.  Today do you still have difficulty?  Patient-Specific activity scoring scheme (Point to one number):  0 1 2 3 4 5 6 7 8 9  10 Unable                                                                                                          Able to perform To perform                                                                                                    activity at the same Activity         Level as before  Injury or problem  Activity            Getting out of the recliner                                                                  Initial:                    5/27:  4    7/24: 4-5   8/27: 6      9/11: 5  2.                   Walking with RW 40 feet                                                              Initial:1                                 7/24: 6-7    8/27: 6      9/11:6  3.                    Standing to brush teeth                                                           Initial:       0 (has to do sitting on shower chair)     5/27: 3       7/24: 4-5   8/27: 0(sits in shower chair)    9/11:6(able to stand but he gets fatigued. He keeps a shower chair behind him)  4. Putting on socks and shoes  while sitting                                                      initial 2-3              7/24:   5   8/27: 0(his brother helps him)    9/11: 0 (His brother helps him)  TODAY'S TREATMENT:  04/28/24: Nu-Step L5 6  min UE/LE while discussing status (cues to keep SPM above 45) Sit to stand pushing from transport chair 6x 5# Kettlebell 2 hands  dead lift to knee level  picking up his cat 5x without UE support (cue for forward gaze improves balance stability-tends to tip forward when looking down) Dynamic balance: bean bag toss to coordinating color no UE support Gait training in // with gait belt x 10  laps with emphasis on longer step length and upright posture, able to do several laps without UE use on railing but needs UE support for turning around Backwards  walking in // bars 2 laps needs bil UE support High step walking with single arm support only 2 laps Lat pulls 10# at dual cable (higher anchor point)  2 x 10 LAQ  7# 2 rounds 10x right/left Seated march with 7# ankle weights 10x (difficult)  Sesated 7# hip abduction/external rotation 10x right/left (difficult)  04/26/24: Nu-Step L5 6 min UE/LE while discussing status (cues to keep SPM above 45) Sit to stand pushing from transport chair 5x 5# Kettlebell rows to simulate picking up his cat 10x right and left without UE support Walk in // bars with single side 5# KB hold 4 laps to simulate carrying a shopping bag Dynamic balance: Static standing with UE reaches in multi planes, trunk rotation, high stepping Blue band trunk extension 5 sec holds 10x (anchored by therapist) Blue band at waist level anchored on // bars with resisted walk forward against band 10x Gait training in // with gait belt x 10  laps with emphasis on longer step length and upright posture, able to do several laps without UE use on railing but needs UE support for turning around Backwards walking in // bars 2 laps needs bil UE support Lat pulls 10# at dual cable (higher anchor point)  2 x 10 LAQ 4# 12x; increased to 6# 10x right/left    04/21/24: Nu-Step L5 6 min UE/LE while discussing status Goal Assessment & PSFS Update (see above)  5STS: 23.54 sec max UE support (last rep  was challenging) Gait training in // with gait belt x 2  laps with emphasis on longer step length and upright posture, able to do several laps without UE use on railing but needs UE support for turning around  Walk in // bars with single side 5# KB hold 2 laps to simulate carrying a shopping bag Seated biceps curl at dual cable 10# x 10 Seated row at dual able 10# x 10 Seated pallof press wit green tb x 10 each direction  Seated shoulder abduction with green TB x 10     04/19/24: Nu-Step L5 7 min UE/LE while discussing status; last 2 minutes challenged pt to keep at 50 spm  Sit to stand pushing from transport chair 5x 5# Kettlebell rows to simulate picking up his cat 10x right and left without UE support Walk in // bars with single side 5# KB hold 4 laps to simulate carrying a shopping bag Dynamic balance: Static standing with UE reaches in multi planes, trunk rotation, high stepping Gait training in // with gait belt x 10  laps with emphasis on longer step length and upright posture, able to do several laps without UE use on railing but needs UE support for turning around Backwards walking in // bars 2 laps needs bil UE support Lat pulls 10# at dual cable (higher anchor point)  2 x 10    PATIENT EDUCATION:  Education details: PT eval findings, anticipated POC, initial HEP, postural awareness, and transfer safety  Person educated: Patient and brother Education method: Explanation, Demonstration, and Handouts Education comprehension: verbalized understanding and returned demonstration  HOME EXERCISE PROGRAM: Access Code: [email or web address] URL: [email or web address] Date: 09/15/2023 Prepared by: Kristeen Sar  Exercises - Sit to Stand with Counter Support  - 3 x daily - 3-4 x weekly - 1 sets - 1-5 reps - Hooklying Clamshell with Resistance  - 2 x daily - 3-4 x weekly - 1 sets - 10 reps - Seated Long Arc Quad  - 1 x daily - 7 x  weekly - 1-3 sets - 10 reps - 5 sec hold - Standing  Hip Abduction with Counter Support  - 1 x daily - 7 x weekly - 1-2 sets - 10 reps - Standing March with Counter Support  - 1 x daily - 7 x weekly - 1-2 sets - 10 reps - Standing Heel Raise with Support  - 1 x daily - 7 x weekly - 1-2 sets - 10 reps - Shoulder External Rotation and Scapular Retraction with Resistance  - 1 x daily - 7 x weekly - 2 sets - 10 reps - Sit to Stand with Armchair  - 1 x daily - 7 x weekly - 1 sets - 5 reps  Patient Education - Tips to reduce freezing episodes with standing or walking   ASSESSMENT:  CLINICAL IMPRESSION: Patient with improved step clearance today with gait.  Improved static balance and reactionary balance with bean bag toss.  He is initially very challenged with forward hip hinge with a weight with too much anterior weight shift however with cue for forward gaze his stability is much improved.  Less UE reliance in standing.  Therapist providing CGA for safety in standing, with gait and with transfers.       OBJECTIVE IMPAIRMENTS: Abnormal gait, decreased balance, decreased ROM, decreased strength, decreased safety awareness, dizziness, impaired flexibility, impaired sensation, postural dysfunction, and pain.   ACTIVITY LIMITATIONS: carrying, lifting, bending, standing, squatting, stairs, transfers, bed mobility, continence, bathing, and locomotion level  PARTICIPATION LIMITATIONS: shopping and community activity  PERSONAL FACTORS: Age, Fitness, Time since onset of injury/illness/exacerbation, and 3+ comorbidities: Compression fx L1 2022, CAD, CKD (stage 4), neuropathy, retinopathy are also affecting patient's functional outcome.   REHAB POTENTIAL: Good  CLINICAL DECISION MAKING: Unstable/unpredictable  EVALUATION COMPLEXITY: High   GOALS: Goals reviewed with patient? Yes  SHORT TERM GOALS: Target date: 07/30/23 Improved TUG to < 25 sec to decrease fall risk.  Baseline: Goal status: deferred  2.  Able to perform 5XSTS without UE assist  and controlled descent showing improved LE strength.  Baseline:  Goal status: deferred 3.  Patient will be able to stand safely for 2 min without UE assist Baseline:  Goal status: met 5/27  LONG TERM GOALS: Target date: 06/16/2024  Ind with advanced HEP Baseline:  Goal status: IN PROGRESS   2.  Sit to stand from the recliner with PSFS score of 5 Baseline:  Goal status:  met 7/24  3.  Gait with RW from the bedroom to the sunroom 40-50 feet with PSFS of 3 Baseline:  Goal status: met 7/24  4. Improved LE mobility needed for greater ease with putting on socks and shoes in sitting with PSFS of 5 Baseline:  Goal status: met 7/24  5.  Patient will be able to stand long enough to brush teeth PSFS of 2 Baseline:  Goal status: met 5/27  6. Able to rise from 22 inch seat height push from the armrests to use the new chairs in the living room (in July) Goal status: met 7/24  7. Patient will be able to stand for 12 minutes needed for grooming and dressing tasks at home    Goal status: ongoing 04/06/2024                      8. The patient will have improved LE strength with 5x STS test with UE assist improved to 25 sec MET 8/27/225  9. The patient will be able to walk for 6 min with UE support with RW or // bars                           Ongoing 04/06/2024  PLAN:  PT FREQUENCY: 2x week  PT DURATION: 8 weeks  PLANNED INTERVENTIONS: [id number]- PT Re-evaluation, 97110-Therapeutic exercises, 97530- Therapeutic activity, 97112- Neuromuscular re-education, 97535- Self Care, [id number]- Manual therapy, [phone]- Gait training, [phone]- Aquatic Therapy, [phone]- Electrical stimulation (unattended), Patient/Family education, Balance training, Stair training, Taping, Joint mobilization, Spinal mobilization, DME instructions, Cryotherapy, and Moist heat  PLAN FOR NEXT SESSION:having spinal tap next Friday 8 am; Nu-Step; dual cable rows;  sit to stand from foam pad; static standing with teal  power cord isometric trunk extension;  UE movements while standing for ADLs; gait in // bars working on standing erect, longer step lengths;  assess energy levels and endurance ; functional strengthening; pt has a goal to lift/carry his cat   Glade Pesa, PT 04/28/24 9:33 PM Phone: [phone] Fax: [phone]

## 2024-04-29 ENCOUNTER — Inpatient Hospital Stay (HOSPITAL_COMMUNITY): Admission: RE | Admit: 2024-04-29 | Source: Ambulatory Visit

## 2024-05-03 ENCOUNTER — Ambulatory Visit: Admitting: Physical Therapy

## 2024-05-03 DIAGNOSIS — R2689 Other abnormalities of gait and mobility: Secondary | ICD-10-CM | POA: Diagnosis not present

## 2024-05-03 DIAGNOSIS — R262 Difficulty in walking, not elsewhere classified: Secondary | ICD-10-CM

## 2024-05-03 DIAGNOSIS — M6281 Muscle weakness (generalized): Secondary | ICD-10-CM

## 2024-05-03 NOTE — Therapy (Signed)
 OUTPATIENT PHYSICAL THERAPY LOWER EXTREMITY TREATMENT    Patient Name: Jeffrey [name] MRN: [id number] DOB:05-18-1956, 68 y.o., male Today's Date: 05/03/2024    END OF SESSION:  PT End of Session - 05/03/24 1403     Visit Number 56    Date for Recertification  06/16/24    Authorization Type HTA    Progress Note Due on Visit 60    PT Start Time 1400    PT Stop Time 1442    PT Time Calculation (min) 42 min    Equipment Utilized During Treatment Gait belt    Activity Tolerance Patient tolerated treatment well                     Past Medical History:  Diagnosis Date   Cardiac arrest ([name]) 05/22/2016   Cataract    CHF (congestive heart failure) ([name])    CKD (chronic kidney disease) stage 3, GFR 30-59 ml/min ([name]) 05/28/2021   CKD (chronic kidney disease) stage 4, GFR 15-29 ml/min ([name]) 05/28/2021   Diabetes mellitus without complication ([name])    type 2   Diabetic retinopathy ([name])    Hyperlipidemia    Hypertension    Memory loss    mild   Myocardial infarct ([name]) 2105   Retinopathy    Both eyes   S/P primary angioplasty with coronary stent 05/22/2016   Sleep-disordered breathing 12/31/2023   Vitamin B12 deficiency    Vitreous hemorrhage of left eye ([name])    proliferative diabetic retinopathy   Past Surgical History:  Procedure Laterality Date   ANGIOPLASTY     2015   COLONOSCOPY     multiple   EYE SURGERY     PARS PLANA VITRECTOMY Left 03/15/2020   Procedure: PARS PLANA VITRECTOMY WITH 25 GAUGE;  Surgeon: Jarold Mayo, MD;  Location: [facility] OR;  Service: Ophthalmology;  Laterality: Left;   PHOTOCOAGULATION WITH LASER Left 03/15/2020   Procedure: PHOTOCOAGULATION WITH LASER; INTRAVITREAL INJECTION OF AVASTIN ;  Surgeon: Jarold Mayo, MD;  Location: [facility] OR;  Service: Ophthalmology;  Laterality: Left;   REFRACTIVE SURGERY     UPPER GI ENDOSCOPY     several   VITRECTOMY     Patient Active Problem List   Diagnosis Date Noted   Sleep-disordered breathing  12/31/2023   Actinic keratosis 11/30/2023   Vitamin D  deficiency 11/30/2023   Rhinitis 05/20/2023   Cervical spondylosis 08/26/2022   CKD (chronic kidney disease) stage 4, GFR 15-29 ml/min ([name]) 05/28/2021   Adjustment disorder 01/22/2021   Chronic back pain 10/22/2020   Peripheral vertigo 05/15/2020   Dermatitis 01/04/2020   Unintentional weight loss with loose stools 05/19/2019   Low vitamin B12 level 03/29/2019   Heme positive stool 03/14/2019   Normocytic anemia 03/14/2019   Chronic diastolic heart failure ([name]) 02/08/2019   Diabetic retinopathy ([name]) 09/08/2018   Physical debility 05/24/2018   Varicose veins of both lower extremities 03/01/2018   Fatigue due to exertion 10/14/2017   GERD (gastroesophageal reflux disease) 08/25/2017   CAD in native artery 04/14/2017   Hyperlipidemia associated with type 2 diabetes mellitus ([name]) 04/14/2017   Diabetic peripheral neuropathy associated with type 2 diabetes mellitus ([name]) 08/17/2016   [name] 05/02/2016   Hypertension associated with diabetes ([name]) 05/02/2016    PCP: Kennyth Worth HERO, MD   REFERRING PROVIDER: Kennyth Worth HERO, MD   REFERRING DIAG: R53.81 (ICD-10-CM) - Physical debility   THERAPY DIAG:  Other abnormalities of gait and mobility  Difficulty in  walking, not elsewhere classified  Muscle weakness (generalized)  Rationale for Evaluation and Treatment: Rehabilitation  ONSET DATE: 2022  SUBJECTIVE:   SUBJECTIVE STATEMENT: Reports last visit was good. Having spinal tap procedure this Friday.    Arrives via transport chair assisted by [name]  Brother: [name] identical twins  PERTINENT HISTORY: 3/11: evidence of lumbar compression deformity Compression fx L1 2022, CAD, CKD (stage 4), DM, cardiac arrest 2017, neuropathy, retinopathy Long history of gait abnormality beginning back in 2022. He has a history of CAD and believes that his debility began following a covid infection.  In December odf 2022 he fell and suffered a compression fracture of L1.  PAIN:  Are you having pain? no  PRECAUTIONS: Fall  RED FLAGS: None   WEIGHT BEARING RESTRICTIONS: No  FALLS:  Has patient fallen in last 6 months? No and with sit to stand falls back sometimes  LIVING ENVIRONMENT: Lives with: lives with their family Lives in: House/apartment Stairs: No Has following equipment at home: Single point cane, Environmental consultant - 2 wheeled, Environmental consultant - 4 wheeled, shower chair, Grab bars, and stand up walker also; also has shower chair at sink for shaving, brushing teeth etc, also has transporter.  OCCUPATION: retired  PLOF: Independent with household mobility with device  PATIENT GOALS: to walk without a walker  NEXT MD VISIT: 09/28/22  OBJECTIVE:  Note: Objective measures were completed at Evaluation unless otherwise noted.  DIAGNOSTIC FINDINGS: N/A  COGNITION: Overall cognitive status: Within functional limits for tasks assessed     SENSATION: Neuropathy in B feet   MUSCLE LENGTH: Marked HS R>L, hip flexor tightness   POSTURE: rounded shoulders and forward head  LOWER EXTREMITY ROM: WFL for tasks assessed  LOWER EXTREMITY MMT:  MMT Right eval Left eval 4/1  5/27 9/4  Hip flexion 4 4 4  Bil 4 Bil 4 Bil  Hip extension Able to bridge Able to bridge 4- Bil 4- Bil 4- Bil  Hip abduction 4-* 4-* 4- Bil 4- Bil 4- Bil  Hip adduction       Hip internal rotation       Hip external rotation       Knee flexion       Knee extension 4 4+ 4- Bil 4- Bil 4- Bil  Ankle dorsiflexion 4+ 4- 4- Bil 4- Bil 4- Bil  Ankle plantarflexion       Ankle inversion       Ankle eversion        (Blank rows = not tested)  3/20:  max UE assist to rise sit to stand; LE hip extension, knee extension grossly 3+ to 4-/5  FUNCTIONAL TESTS:  30 seconds chair stand test Timed up and go (TUG): 59.36 with 4WW Stance time: 45 sec patient reports unsteadiness and feels he goes backwards 07/27/2023 92  ft decreased cadence; decreased step length; absent heel strike  12/26: 5x STS: unable to rise without heavy UE use; attempted to use armrests for test but does not come all the way up;  next trial with chair +cushion to touch at least one RW handle 48.2 sec:   TUG: cushion in seat +arm use and RW:  52.49 sec  08/13/23 attempted 5x STS but pt unable to complete: 1st rep does not come all the way up and then plops heavily into the transport wheelchair (he then recounts how this happened at home and how the chair tipped backwards) unable to complete reps Unable to complete TUG today, he does not have  his 4 wheeled walker today and feels he is slower with the clinic RW  09/15/2023 TUG: 1:03 with rollator 5STS:48.55 sec heavy reliance on UE support; last repetition was a challenge  3/20: Heavy UE reliance with STS (unable to push up from [facility] armrests, pulls up from // bars) 4/1: max UE assist with sit to stand  5/27:  5x STS no cushion: able to complete 4 reps 47.19 sec            5x STS with cushion 22.5 seat height 5x 43.09 sec able to push up from [facility] armrests rather than // bars (much less reliance on Ues to rise)  7/24: 5x STS with cushion 53.30 needs UE assist from [facility], 2nd trial 32.22 push from [facility] armrests  04/06/2024 5STS: 25.38 sec with UE support pushing from armrest (w/c + airex pad)  04/21/2024 5STS:23.54 sec max UE support (last rep was challenging)  GAIT: Distance walked: 25 Assistive device utilized: Walker - 4 wheeled Level of assistance: Modified independence Comments: slow speed, decreased step and stride, decreased heel strike, forward trunk lean.  TRANSFERS: Sit to stand: Uses walker to pull up; stand to sit: does not fully back up to chair and does not reach back; Supine to sit: needs min A for log roll  (has some back pain with sit <>supine due to sitting upward from supine) also fear of falling from bed, sit to supine needs min A to lift legs on to mat table The  Patient-Specific Functional Scale  Initial:  I am going to ask you to identify up to 3 important activities that you are unable to do or are having difficulty with as a result of this problem.  Today are there any activities that you are unable to do or having difficulty with because of this?  (Patient shown scale and patient rated each activity)  Follow up: When you first came in you had difficulty performing these activities.  Today do you still have difficulty?  Patient-Specific activity scoring scheme (Point to one number):  0 1 2 3 4 5 6 7 8 9  10 Unable                                                                                                          Able to perform To perform                                                                                                    activity at the same Activity         Level as before  Injury or problem  Activity            Getting out of the recliner                                                                  Initial:                    5/27:  4    7/24: 4-5   8/27: 6      9/11: 5  2.                   Walking with RW 40 feet                                                              Initial:1                                 7/24: 6-7    8/27: 6      9/11:6  3.                    Standing to brush teeth                                                           Initial:       0 (has to do sitting on shower chair)     5/27: 3       7/24: 4-5   8/27: 0(sits in shower chair)    9/11:6(able to stand but he gets fatigued. He keeps a shower chair behind him)  4. Putting on socks and shoes  while sitting                                                      initial 2-3              7/24:   5   8/27: 0(his brother helps him)    9/11: 0 (His brother helps him)  TODAY'S TREATMENT:  05/03/24: Nu-Step L5 6 min UE/LE while  discussing status (cues to keep SPM above 45-50 especially for last minute) Leg press seat 8 50# bil 2x10 Dynamic balance: no UE support with head turns, trunk rotation, UE reaches Gait with Hand-held assist 14.2 feet with close follow of wheelchair  Gait training in // with gait belt x 10  laps with emphasis on longer step length and upright posture, able to do several laps without UE use on railing but needs UE support for turning around Lat pulls 10# at dual cable (higher anchor point)  2 x 10  04/28/24: Nu-Step L5  6 min UE/LE while discussing status (cues to keep SPM above 45) Sit to stand pushing from transport chair 6x 5# Kettlebell 2 hands  dead lift to knee level  picking up his cat 5x without UE support (cue for forward gaze improves balance stability-tends to tip forward when looking down) Dynamic balance: bean bag toss to coordinating color no UE support Gait training in // with gait belt x 10  laps with emphasis on longer step length and upright posture, able to do several laps without UE use on railing but needs UE support for turning around Backwards walking in // bars 2 laps needs bil UE support High step walking with single arm support only 2 laps Lat pulls 10# at dual cable (higher anchor point)  2 x 10 LAQ  7# 2 rounds 10x right/left Seated march with 7# ankle weights 10x (difficult)  Sesated 7# hip abduction/external rotation 10x right/left (difficult)  04/26/24: Nu-Step L5 6 min UE/LE while discussing status (cues to keep SPM above 45) Sit to stand pushing from transport chair 5x 5# Kettlebell rows to simulate picking up his cat 10x right and left without UE support Walk in // bars with single side 5# KB hold 4 laps to simulate carrying a shopping bag Dynamic balance: Static standing with UE reaches in multi planes, trunk rotation, high stepping Blue band trunk extension 5 sec holds 10x (anchored by therapist) Blue band at waist level anchored on // bars with resisted  walk forward against band 10x Gait training in // with gait belt x 10  laps with emphasis on longer step length and upright posture, able to do several laps without UE use on railing but needs UE support for turning around Backwards walking in // bars 2 laps needs bil UE support Lat pulls 10# at dual cable (higher anchor point)  2 x 10 LAQ 4# 12x; increased to 6# 10x right/left    04/21/24: Nu-Step L5 6 min UE/LE while discussing status Goal Assessment & PSFS Update (see above)  5STS: 23.54 sec max UE support (last rep was challenging) Gait training in // with gait belt x 2  laps with emphasis on longer step length and upright posture, able to do several laps without UE use on railing but needs UE support for turning around  Walk in // bars with single side 5# KB hold 2 laps to simulate carrying a shopping bag Seated biceps curl at dual cable 10# x 10 Seated row at dual able 10# x 10 Seated pallof press wit green tb x 10 each direction  Seated shoulder abduction with green TB x 10     04/19/24: Nu-Step L5 7 min UE/LE while discussing status; last 2 minutes challenged pt to keep at 50 spm  Sit to stand pushing from transport chair 5x 5# Kettlebell rows to simulate picking up his cat 10x right and left without UE support Walk in // bars with single side 5# KB hold 4 laps to simulate carrying a shopping bag Dynamic balance: Static standing with UE reaches in multi planes, trunk rotation, high stepping Gait training in // with gait belt x 10  laps with emphasis on longer step length and upright posture, able to do several laps without UE use on railing but needs UE support for turning around Backwards walking in // bars 2 laps needs bil UE support Lat pulls 10# at dual cable (higher anchor point)  2 x 10    PATIENT EDUCATION:  Education details: PT eval  findings, anticipated POC, initial HEP, postural awareness, and transfer safety  Person educated: Patient and brother Education  method: Explanation, Demonstration, and Handouts Education comprehension: verbalized understanding and returned demonstration  HOME EXERCISE PROGRAM: Access Code: [email or web address] URL: [email or web address] Date: 09/15/2023 Prepared by: Kristeen Sar  Exercises - Sit to Stand with Counter Support  - 3 x daily - 3-4 x weekly - 1 sets - 1-5 reps - Hooklying Clamshell with Resistance  - 2 x daily - 3-4 x weekly - 1 sets - 10 reps - Seated Long Arc Quad  - 1 x daily - 7 x weekly - 1-3 sets - 10 reps - 5 sec hold - Standing Hip Abduction with Counter Support  - 1 x daily - 7 x weekly - 1-2 sets - 10 reps - Standing March with Counter Support  - 1 x daily - 7 x weekly - 1-2 sets - 10 reps - Standing Heel Raise with Support  - 1 x daily - 7 x weekly - 1-2 sets - 10 reps - Shoulder External Rotation and Scapular Retraction with Resistance  - 1 x daily - 7 x weekly - 2 sets - 10 reps - Sit to Stand with Armchair  - 1 x daily - 7 x weekly - 1 sets - 5 reps  Patient Education - Tips to reduce freezing episodes with standing or walking   ASSESSMENT:  CLINICAL IMPRESSION: Good response to the leg press for LE strengthening.  He does need set up assistance to lift Les onto the platform.   Decreased gait speed and foot clearance with gait outside parallel bars.  Close CGA for safety during ambulation and transfers.  Verbal cues for erect posture and forward gaze during standing.     OBJECTIVE IMPAIRMENTS: Abnormal gait, decreased balance, decreased ROM, decreased strength, decreased safety awareness, dizziness, impaired flexibility, impaired sensation, postural dysfunction, and pain.   ACTIVITY LIMITATIONS: carrying, lifting, bending, standing, squatting, stairs, transfers, bed mobility, continence, bathing, and locomotion level  PARTICIPATION LIMITATIONS: shopping and community activity  PERSONAL FACTORS: Age, Fitness, Time since onset of injury/illness/exacerbation, and 3+ comorbidities:  Compression fx L1 2022, CAD, CKD (stage 4), neuropathy, retinopathy are also affecting patient's functional outcome.   REHAB POTENTIAL: Good  CLINICAL DECISION MAKING: Unstable/unpredictable  EVALUATION COMPLEXITY: High   GOALS: Goals reviewed with patient? Yes  SHORT TERM GOALS: Target date: 07/30/23 Improved TUG to < 25 sec to decrease fall risk.  Baseline: Goal status: deferred  2.  Able to perform 5XSTS without UE assist and controlled descent showing improved LE strength.  Baseline:  Goal status: deferred 3.  Patient will be able to stand safely for 2 min without UE assist Baseline:  Goal status: met 5/27  LONG TERM GOALS: Target date: 06/16/2024  Ind with advanced HEP Baseline:  Goal status: IN PROGRESS   2.  Sit to stand from the recliner with PSFS score of 5 Baseline:  Goal status:  met 7/24  3.  Gait with RW from the bedroom to the sunroom 40-50 feet with PSFS of 3 Baseline:  Goal status: met 7/24  4. Improved LE mobility needed for greater ease with putting on socks and shoes in sitting with PSFS of 5 Baseline:  Goal status: met 7/24  5.  Patient will be able to stand long enough to brush teeth PSFS of 2 Baseline:  Goal status: met 5/27  6. Able to rise from 22 inch seat height push from the armrests to use the new chairs in the  living room (in July) Goal status: met 7/24  7. Patient will be able to stand for 12 minutes needed for grooming and dressing tasks at home    Goal status: ongoing 04/06/2024                      8. The patient will have improved LE strength with 5x STS test with UE assist improved to 25 sec MET 8/27/225                       9. The patient will be able to walk for 6 min with UE support with RW or // bars                           Ongoing 04/06/2024  PLAN:  PT FREQUENCY: 2x week  PT DURATION: 8 weeks  PLANNED INTERVENTIONS: 97164- PT Re-evaluation, 97110-Therapeutic exercises, 97530- Therapeutic activity, 97112-  Neuromuscular re-education, 97535- Self Care, [id number]- Manual therapy, [phone]- Gait training, [phone]- Aquatic Therapy, 97014- Electrical stimulation (unattended), Patient/Family education, Balance training, Stair training, Taping, Joint mobilization, Spinal mobilization, DME instructions, Cryotherapy, and Moist heat  PLAN FOR NEXT SESSION:having spinal tap next Friday 8 am; try gait with RW in clinic for endurance;  Nu-Step; dual cable rows;  sit to stand from foam pad; static standing with teal power cord isometric trunk extension;  UE movements while standing for ADLs; gait in // bars working on standing erect, longer step lengths;  assess energy levels and endurance ; functional strengthening; pt has a goal to lift/carry his cat  Glade Pesa, PT 05/03/24 8:34 PM Phone: [phone] Fax: [phone]

## 2024-05-04 DIAGNOSIS — H3582 Retinal ischemia: Secondary | ICD-10-CM | POA: Diagnosis not present

## 2024-05-04 DIAGNOSIS — E113593 Type 2 diabetes mellitus with proliferative diabetic retinopathy without macular edema, bilateral: Secondary | ICD-10-CM | POA: Diagnosis not present

## 2024-05-04 DIAGNOSIS — H31093 Other chorioretinal scars, bilateral: Secondary | ICD-10-CM | POA: Diagnosis not present

## 2024-05-04 DIAGNOSIS — H2513 Age-related nuclear cataract, bilateral: Secondary | ICD-10-CM | POA: Diagnosis not present

## 2024-05-05 ENCOUNTER — Ambulatory Visit: Admitting: Physical Therapy

## 2024-05-05 DIAGNOSIS — R2689 Other abnormalities of gait and mobility: Secondary | ICD-10-CM | POA: Diagnosis not present

## 2024-05-05 DIAGNOSIS — M6281 Muscle weakness (generalized): Secondary | ICD-10-CM

## 2024-05-05 DIAGNOSIS — R262 Difficulty in walking, not elsewhere classified: Secondary | ICD-10-CM

## 2024-05-05 NOTE — Therapy (Signed)
 OUTPATIENT PHYSICAL THERAPY LOWER EXTREMITY TREATMENT    Patient Name: Jeffrey [name] MRN: [id number] DOB:August 09, 1956, 68 y.o., male Today's Date: 05/05/2024    END OF SESSION:  PT End of Session - 05/05/24 1356     Visit Number 57    Date for Recertification  06/16/24    Authorization Type HTA    Progress Note Due on Visit 60    PT Start Time 1400    PT Stop Time 1443    PT Time Calculation (min) 43 min    Equipment Utilized During Treatment Gait belt    Activity Tolerance Patient tolerated treatment well                     Past Medical History:  Diagnosis Date   Cardiac arrest ([name]) 05/22/2016   Cataract    CHF (congestive heart failure) ([name])    CKD (chronic kidney disease) stage 3, GFR 30-59 ml/min ([name]) 05/28/2021   CKD (chronic kidney disease) stage 4, GFR 15-29 ml/min ([name]) 05/28/2021   Diabetes mellitus without complication ([name])    type 2   Diabetic retinopathy ([name])    Hyperlipidemia    Hypertension    Memory loss    mild   Myocardial infarct ([name]) 2105   Retinopathy    Both eyes   S/P primary angioplasty with coronary stent 05/22/2016   Sleep-disordered breathing 12/31/2023   Vitamin B12 deficiency    Vitreous hemorrhage of left eye ([name])    proliferative diabetic retinopathy   Past Surgical History:  Procedure Laterality Date   ANGIOPLASTY     2015   COLONOSCOPY     multiple   EYE SURGERY     PARS PLANA VITRECTOMY Left 03/15/2020   Procedure: PARS PLANA VITRECTOMY WITH 25 GAUGE;  Surgeon: Jarold Mayo, MD;  Location: [facility] OR;  Service: Ophthalmology;  Laterality: Left;   PHOTOCOAGULATION WITH LASER Left 03/15/2020   Procedure: PHOTOCOAGULATION WITH LASER; INTRAVITREAL INJECTION OF AVASTIN ;  Surgeon: Jarold Mayo, MD;  Location: [facility] OR;  Service: Ophthalmology;  Laterality: Left;   REFRACTIVE SURGERY     UPPER GI ENDOSCOPY     several   VITRECTOMY     Patient Active Problem List   Diagnosis Date Noted   Sleep-disordered breathing  12/31/2023   Actinic keratosis 11/30/2023   Vitamin D  deficiency 11/30/2023   Rhinitis 05/20/2023   Cervical spondylosis 08/26/2022   CKD (chronic kidney disease) stage 4, GFR 15-29 ml/min ([name]) 05/28/2021   Adjustment disorder 01/22/2021   Chronic back pain 10/22/2020   Peripheral vertigo 05/15/2020   Dermatitis 01/04/2020   Unintentional weight loss with loose stools 05/19/2019   Low vitamin B12 level 03/29/2019   Heme positive stool 03/14/2019   Normocytic anemia 03/14/2019   Chronic diastolic heart failure ([name]) 02/08/2019   Diabetic retinopathy ([name]) 09/08/2018   Physical debility 05/24/2018   Varicose veins of both lower extremities 03/01/2018   Fatigue due to exertion 10/14/2017   GERD (gastroesophageal reflux disease) 08/25/2017   CAD in native artery 04/14/2017   Hyperlipidemia associated with type 2 diabetes mellitus ([name]) 04/14/2017   Diabetic peripheral neuropathy associated with type 2 diabetes mellitus ([name]) 08/17/2016   [name] 05/02/2016   Hypertension associated with diabetes ([name]) 05/02/2016    PCP: Kennyth Worth HERO, MD   REFERRING PROVIDER: Kennyth Worth HERO, MD   REFERRING DIAG: R53.81 (ICD-10-CM) - Physical debility   THERAPY DIAG:  Other abnormalities of gait and mobility  Difficulty in  walking, not elsewhere classified  Muscle weakness (generalized)  Rationale for Evaluation and Treatment: Rehabilitation  ONSET DATE: 2022  SUBJECTIVE:   SUBJECTIVE STATEMENT: Patient arrives with 4 WW today instead of transport wheelchair.  His transportation reports he did well getting into the clinic with the walker today.  Having spinal tap procedure tomorrow.   transportion by [name]  Brother: [name] identical twins  PERTINENT HISTORY: 3/11: evidence of lumbar compression deformity Compression fx L1 2022, CAD, CKD (stage 4), DM, cardiac arrest 2017, neuropathy, retinopathy Long history of gait abnormality beginning back in  2022. He has a history of CAD and believes that his debility began following a covid infection. In December odf 2022 he fell and suffered a compression fracture of L1.  PAIN:  Are you having pain? no  PRECAUTIONS: Fall  RED FLAGS: None   WEIGHT BEARING RESTRICTIONS: No  FALLS:  Has patient fallen in last 6 months? No and with sit to stand falls back sometimes  LIVING ENVIRONMENT: Lives with: lives with their family Lives in: House/apartment Stairs: No Has following equipment at home: Single point cane, Environmental consultant - 2 wheeled, Environmental consultant - 4 wheeled, shower chair, Grab bars, and stand up walker also; also has shower chair at sink for shaving, brushing teeth etc, also has transporter.  OCCUPATION: retired  PLOF: Independent with household mobility with device  PATIENT GOALS: to walk without a walker  NEXT MD VISIT: 09/28/22  OBJECTIVE:  Note: Objective measures were completed at Evaluation unless otherwise noted.  DIAGNOSTIC FINDINGS: N/A  COGNITION: Overall cognitive status: Within functional limits for tasks assessed     SENSATION: Neuropathy in B feet   MUSCLE LENGTH: Marked HS R>L, hip flexor tightness   POSTURE: rounded shoulders and forward head  LOWER EXTREMITY ROM: WFL for tasks assessed  LOWER EXTREMITY MMT:  MMT Right eval Left eval 4/1  5/27 9/4  Hip flexion 4 4 4  Bil 4 Bil 4 Bil  Hip extension Able to bridge Able to bridge 4- Bil 4- Bil 4- Bil  Hip abduction 4-* 4-* 4- Bil 4- Bil 4- Bil  Hip adduction       Hip internal rotation       Hip external rotation       Knee flexion       Knee extension 4 4+ 4- Bil 4- Bil 4- Bil  Ankle dorsiflexion 4+ 4- 4- Bil 4- Bil 4- Bil  Ankle plantarflexion       Ankle inversion       Ankle eversion        (Blank rows = not tested)  3/20:  max UE assist to rise sit to stand; LE hip extension, knee extension grossly 3+ to 4-/5  FUNCTIONAL TESTS:  30 seconds chair stand test Timed up and go (TUG): 59.36 with  4WW Stance time: 45 sec patient reports unsteadiness and feels he goes backwards 07/27/2023 92 ft decreased cadence; decreased step length; absent heel strike  12/26: 5x STS: unable to rise without heavy UE use; attempted to use armrests for test but does not come all the way up;  next trial with chair +cushion to touch at least one RW handle 48.2 sec:   TUG: cushion in seat +arm use and RW:  52.49 sec  08/13/23 attempted 5x STS but pt unable to complete: 1st rep does not come all the way up and then plops heavily into the transport wheelchair (he then recounts how this happened at home and how the chair  tipped backwards) unable to complete reps Unable to complete TUG today, he does not have his 4 wheeled walker today and feels he is slower with the clinic RW  09/15/2023 TUG: 1:03 with rollator 5STS:48.55 sec heavy reliance on UE support; last repetition was a challenge  3/20: Heavy UE reliance with STS (unable to push up from [facility] armrests, pulls up from // bars) 4/1: max UE assist with sit to stand  5/27:  5x STS no cushion: able to complete 4 reps 47.19 sec            5x STS with cushion 22.5 seat height 5x 43.09 sec able to push up from [facility] armrests rather than // bars (much less reliance on Ues to rise)  7/24: 5x STS with cushion 53.30 needs UE assist from [facility], 2nd trial 32.22 push from [facility] armrests  04/06/2024 5STS: 25.38 sec with UE support pushing from armrest (w/c + airex pad)  04/21/2024 5STS:23.54 sec max UE support (last rep was challenging)  GAIT: Distance walked: 25 Assistive device utilized: Walker - 4 wheeled Level of assistance: Modified independence Comments: slow speed, decreased step and stride, decreased heel strike, forward trunk lean.  TRANSFERS: Sit to stand: Uses walker to pull up; stand to sit: does not fully back up to chair and does not reach back; Supine to sit: needs min A for log roll  (has some back pain with sit <>supine due to sitting upward from  supine) also fear of falling from bed, sit to supine needs min A to lift legs on to mat table The Patient-Specific Functional Scale  Initial:  I am going to ask you to identify up to 3 important activities that you are unable to do or are having difficulty with as a result of this problem.  Today are there any activities that you are unable to do or having difficulty with because of this?  (Patient shown scale and patient rated each activity)  Follow up: When you first came in you had difficulty performing these activities.  Today do you still have difficulty?  Patient-Specific activity scoring scheme (Point to one number):  0 1 2 3 4 5 6 7 8 9  10 Unable                                                                                                          Able to perform To perform                                                                                                    activity at the same Activity  Level as before                                                                                                                       Injury or problem  Activity            Getting out of the recliner                                                                  Initial:                    5/27:  4    7/24: 4-5   8/27: 6      9/11: 5  2.                   Walking with RW 40 feet                                                              Initial:1                                 7/24: 6-7    8/27: 6      9/11:6  3.                    Standing to brush teeth                                                           Initial:       0 (has to do sitting on shower chair)     5/27: 3       7/24: 4-5   8/27: 0(sits in shower chair)    9/11:6(able to stand but he gets fatigued. He keeps a shower chair behind him)  4. Putting on socks and shoes  while sitting                                                      initial 2-3              7/24:   5   8/27: 0(his  brother helps him)     9/11: 0 (His brother helps him)  TODAY'S TREATMENT:  05/05/24: Nu-Step L4 7 min UE/LE while discussing status average 57-59 SPM  Gait with 4 WW: 1st round 89 feet in 3 min; 2nd round 207 feet in 8:18 minutes RPE (Rating of Perceived Exertion):   7  /10 LAQ 7# 10x2 Seated 7# hip flexion 7x (fatigues quickly) Seated 2# overhead press 10x right/left  Chair sit ups 10x (difficult on last few reps)  Seated 5# kettlebell hip to hip core 10x  Gait with 4 WW out of clinic and min assist down handicap ramp; supervision and verbal cues for transfer into the car  05/03/24: Nu-Step L5 6 min UE/LE while discussing status (cues to keep SPM above 45-50 especially for last minute) Leg press seat 8 50# bil 2x10 Dynamic balance: no UE support with head turns, trunk rotation, UE reaches Gait with Hand-held assist 14.2 feet with close follow of wheelchair  Gait training in // with gait belt x 10  laps with emphasis on longer step length and upright posture, able to do several laps without UE use on railing but needs UE support for turning around Lat pulls 10# at dual cable (higher anchor point)  2 x 10  04/28/24: Nu-Step L5 6 min UE/LE while discussing status (cues to keep SPM above 45) Sit to stand pushing from transport chair 6x 5# Kettlebell 2 hands  dead lift to knee level  picking up his cat 5x without UE support (cue for forward gaze improves balance stability-tends to tip forward when looking down) Dynamic balance: bean bag toss to coordinating color no UE support Gait training in // with gait belt x 10  laps with emphasis on longer step length and upright posture, able to do several laps without UE use on railing but needs UE support for turning around Backwards walking in // bars 2 laps needs bil UE support High step walking with single arm support only 2 laps Lat pulls 10# at dual cable (higher anchor point)  2 x 10 LAQ  7# 2 rounds 10x right/left Seated march with 7# ankle weights 10x (difficult)   Sesated 7# hip abduction/external rotation 10x right/left (difficult)  04/26/24: Nu-Step L5 6 min UE/LE while discussing status (cues to keep SPM above 45) Sit to stand pushing from transport chair 5x 5# Kettlebell rows to simulate picking up his cat 10x right and left without UE support Walk in // bars with single side 5# KB hold 4 laps to simulate carrying a shopping bag Dynamic balance: Static standing with UE reaches in multi planes, trunk rotation, high stepping Blue band trunk extension 5 sec holds 10x (anchored by therapist) Blue band at waist level anchored on // bars with resisted walk forward against band 10x Gait training in // with gait belt x 10  laps with emphasis on longer step length and upright posture, able to do several laps without UE use on railing but needs UE support for turning around Backwards walking in // bars 2 laps needs bil UE support Lat pulls 10# at dual cable (higher anchor point)  2 x 10 LAQ 4# 12x; increased to 6# 10x right/left    04/21/24: Nu-Step L5 6 min UE/LE while discussing status Goal Assessment & PSFS Update (see above)  5STS: 23.54 sec max UE support (last rep was challenging) Gait training in // with gait belt x 2  laps with emphasis on longer step length and upright posture, able to  do several laps without UE use on railing but needs UE support for turning around  Walk in // bars with single side 5# KB hold 2 laps to simulate carrying a shopping bag Seated biceps curl at dual cable 10# x 10 Seated row at dual able 10# x 10 Seated pallof press wit green tb x 10 each direction  Seated shoulder abduction with green TB x 10    PATIENT EDUCATION:  Education details: PT eval findings, anticipated POC, initial HEP, postural awareness, and transfer safety  Person educated: Patient and brother Education method: Explanation, Demonstration, and Handouts Education comprehension: verbalized understanding and returned demonstration  HOME EXERCISE  PROGRAM: Access Code: [email or web address] URL: [email or web address] Date: 09/15/2023 Prepared by: Kristeen Sar  Exercises - Sit to Stand with Counter Support  - 3 x daily - 3-4 x weekly - 1 sets - 1-5 reps - Hooklying Clamshell with Resistance  - 2 x daily - 3-4 x weekly - 1 sets - 10 reps - Seated Long Arc Quad  - 1 x daily - 7 x weekly - 1-3 sets - 10 reps - 5 sec hold - Standing Hip Abduction with Counter Support  - 1 x daily - 7 x weekly - 1-2 sets - 10 reps - Standing March with Counter Support  - 1 x daily - 7 x weekly - 1-2 sets - 10 reps - Standing Heel Raise with Support  - 1 x daily - 7 x weekly - 1-2 sets - 10 reps - Shoulder External Rotation and Scapular Retraction with Resistance  - 1 x daily - 7 x weekly - 2 sets - 10 reps - Sit to Stand with Armchair  - 1 x daily - 7 x weekly - 1 sets - 5 reps  Patient Education - Tips to reduce freezing episodes with standing or walking   ASSESSMENT:  CLINICAL IMPRESSION: Good response to gait with 4 WW in the clinic with cues provided to avoid pushing the walker too far forward and to increase step length and clearance.  He is moderately fatigued after walking 8 minutes and reports he is exhausted after session with transferring into the car.  We discussed using the transport chair when he returns home.  He would benefit from further strengthening especially Les to avoid over reliance of UE weight bearing on the walker.    OBJECTIVE IMPAIRMENTS: Abnormal gait, decreased balance, decreased ROM, decreased strength, decreased safety awareness, dizziness, impaired flexibility, impaired sensation, postural dysfunction, and pain.   ACTIVITY LIMITATIONS: carrying, lifting, bending, standing, squatting, stairs, transfers, bed mobility, continence, bathing, and locomotion level  PARTICIPATION LIMITATIONS: shopping and community activity  PERSONAL FACTORS: Age, Fitness, Time since onset of injury/illness/exacerbation, and 3+  comorbidities: Compression fx L1 2022, CAD, CKD (stage 4), neuropathy, retinopathy are also affecting patient's functional outcome.   REHAB POTENTIAL: Good  CLINICAL DECISION MAKING: Unstable/unpredictable  EVALUATION COMPLEXITY: High   GOALS: Goals reviewed with patient? Yes  SHORT TERM GOALS: Target date: 07/30/23 Improved TUG to < 25 sec to decrease fall risk.  Baseline: Goal status: deferred  2.  Able to perform 5XSTS without UE assist and controlled descent showing improved LE strength.  Baseline:  Goal status: deferred 3.  Patient will be able to stand safely for 2 min without UE assist Baseline:  Goal status: met 5/27  LONG TERM GOALS: Target date: 06/16/2024  Ind with advanced HEP Baseline:  Goal status: IN PROGRESS   2.  Sit to stand from the recliner with PSFS score  of 5 Baseline:  Goal status:  met 7/24  3.  Gait with RW from the bedroom to the sunroom 40-50 feet with PSFS of 3 Baseline:  Goal status: met 7/24  4. Improved LE mobility needed for greater ease with putting on socks and shoes in sitting with PSFS of 5 Baseline:  Goal status: met 7/24  5.  Patient will be able to stand long enough to brush teeth PSFS of 2 Baseline:  Goal status: met 5/27  6. Able to rise from 22 inch seat height push from the armrests to use the new chairs in the living room (in July) Goal status: met 7/24  7. Patient will be able to stand for 12 minutes needed for grooming and dressing tasks at home    Goal status: ongoing 04/06/2024                      8. The patient will have improved LE strength with 5x STS test with UE assist improved to 25 sec MET 8/27/225                       9. The patient will be able to walk for 6 min with UE support with RW or // bars                           Ongoing 04/06/2024  PLAN:  PT FREQUENCY: 2x week  PT DURATION: 8 weeks  PLANNED INTERVENTIONS: 97164- PT Re-evaluation, 97110-Therapeutic exercises, 97530- Therapeutic activity,  97112- Neuromuscular re-education, 97535- Self Care, 02859- Manual therapy, [phone]- Gait training, [phone]- Aquatic Therapy, 97014- Electrical stimulation (unattended), Patient/Family education, Balance training, Stair training, Taping, Joint mobilization, Spinal mobilization, DME instructions, Cryotherapy, and Moist heat  PLAN FOR NEXT SESSION:having spinal tap next Friday 8 am; gait with RW in clinic for endurance;  Nu-Step; dual cable rows;  sit to stand from foam pad; static standing with teal power cord isometric trunk extension;  UE movements while standing for ADLs; gait in // bars working on standing erect, longer step lengths;  assess energy levels and endurance ; functional strengthening; pt has a goal to lift/carry his cat  Glade Pesa, PT 05/05/24 10:57 PM Phone: [phone] Fax: [phone]

## 2024-05-06 ENCOUNTER — Ambulatory Visit (HOSPITAL_COMMUNITY)
Admission: RE | Admit: 2024-05-06 | Discharge: 2024-05-06 | Disposition: A | Discharge status: Home / Self Care | Source: Ambulatory Visit | Attending: Family Medicine | Admitting: Family Medicine

## 2024-05-06 ENCOUNTER — Ambulatory Visit (HOSPITAL_COMMUNITY)
Admission: RE | Admit: 2024-05-06 | Discharge: 2024-05-06 | Disposition: A | Discharge status: Home / Self Care | Source: Ambulatory Visit | Attending: Neurology | Admitting: Neurology

## 2024-05-06 ENCOUNTER — Other Ambulatory Visit: Payer: Self-pay

## 2024-05-06 DIAGNOSIS — R4189 Other symptoms and signs involving cognitive functions and awareness: Secondary | ICD-10-CM | POA: Insufficient documentation

## 2024-05-06 DIAGNOSIS — R269 Unspecified abnormalities of gait and mobility: Secondary | ICD-10-CM | POA: Diagnosis not present

## 2024-05-06 DIAGNOSIS — G9389 Other specified disorders of brain: Secondary | ICD-10-CM | POA: Diagnosis not present

## 2024-05-06 DIAGNOSIS — G912 (Idiopathic) normal pressure hydrocephalus: Secondary | ICD-10-CM | POA: Insufficient documentation

## 2024-05-06 LAB — CSF CELL COUNT WITH DIFFERENTIAL
RBC Count, CSF: 1 /mm3 — ABNORMAL HIGH
Tube #: 3
WBC, CSF: 1 /mm3 (ref 0–5)

## 2024-05-06 LAB — GLUCOSE, CAPILLARY
Glucose-Capillary: 195 mg/dL — ABNORMAL HIGH (ref 70–99)
Glucose-Capillary: 164 mg/dL — ABNORMAL HIGH (ref 70–99)

## 2024-05-06 LAB — PROTEIN AND GLUCOSE, CSF
Glucose, CSF: 79 mg/dL — ABNORMAL HIGH (ref 40–70)
Total  Protein, CSF: 52 mg/dL — ABNORMAL HIGH (ref 15–45)

## 2024-05-06 MED ORDER — LIDOCAINE 1 % OPTIME INJ - NO CHARGE
5.0000 mL | Freq: Once | INTRAMUSCULAR | Status: AC
  Administered 2024-05-06: 3 mL via INTRADERMAL
  Filled 2024-05-06: qty 6

## 2024-05-06 MED ORDER — ACETAMINOPHEN 325 MG PO TABS: 650.0000 mg | ORAL_TABLET | ORAL | Status: DC | PRN

## 2024-05-06 NOTE — Discharge Instructions (Signed)
Myelogram and Lumbar Puncture Discharge Instructions  Go home and rest quietly for the next 24 hours.  It is important to lie flat for the next 24 hours.  Get up only to go to the restroom.  You may lie in the bed or on a couch on your back, your stomach, your left side or your right side.  You may have one pillow under your head.  You may have pillows between your knees while you are on your side or under your knees while you are on your back.  DO NOT drive today.  Recline the seat as far back as it will go, while still wearing your seat belt, on the way home.  You may get up to go to the bathroom as needed.  You may sit up for 10 minutes to eat.  You may resume your normal diet and medications unless otherwise indicated.  The incidence of headache, nausea, or vomiting is about 5% (one in 20 patients).  If you develop a headache, lie flat and drink plenty of fluids until the headache goes away.  Caffeinated beverages may be helpful.  If you develop severe nausea and vomiting or a headache that does not go away with flat bed rest, call [phone].  You may resume normal activities after your 24 hours of bed rest is over; however, do not exert yourself strongly or do any heavy lifting tomorrow.  Call your physician for a follow-up appointment.  The results of your myelogram will be sent directly to your physician by the following day.  Discharge instructions have been explained to the patient.  The patient, or the person responsible for the patient, fully understands these instructions.   

## 2024-05-06 NOTE — Procedures (Signed)
 PROCEDURE SUMMARY:  Successful fluoroscopic guided lumbar puncture. No immediate complications.  Pt tolerated well.   EBL = none  Please see full dictation in imaging section of Epic for procedure details.

## 2024-05-09 ENCOUNTER — Telehealth: Payer: Self-pay | Admitting: Neurology

## 2024-05-09 ENCOUNTER — Encounter (HOSPITAL_BASED_OUTPATIENT_CLINIC_OR_DEPARTMENT_OTHER): Payer: Self-pay | Admitting: Cardiovascular Disease

## 2024-05-09 ENCOUNTER — Other Ambulatory Visit: Payer: Self-pay

## 2024-05-09 DIAGNOSIS — G912 (Idiopathic) normal pressure hydrocephalus: Secondary | ICD-10-CM

## 2024-05-09 NOTE — Telephone Encounter (Signed)
 I called patient about lumbar puncture. He thinks and his brother felt he was walking better and thinking more clearly. Brother mentions that the day after the LP, patient was able to get up on his own. Brother has seen a big improvement.  PT notes showed improvement in timed up and go, balance tests, mini-mental status.  This does seem consistent with normal pressure hydrocephalus. I discussed that treatment could include drain placed by neurosurgery. Patient would like referral to discuss his options.   All questions were answered.  Venetia Potters, MD [facility] Neurology

## 2024-05-10 ENCOUNTER — Ambulatory Visit: Admitting: Physical Therapy

## 2024-05-10 DIAGNOSIS — R262 Difficulty in walking, not elsewhere classified: Secondary | ICD-10-CM

## 2024-05-10 DIAGNOSIS — M6281 Muscle weakness (generalized): Secondary | ICD-10-CM

## 2024-05-10 DIAGNOSIS — R2689 Other abnormalities of gait and mobility: Secondary | ICD-10-CM

## 2024-05-10 NOTE — Therapy (Signed)
 OUTPATIENT PHYSICAL THERAPY LOWER EXTREMITY TREATMENT    Patient Name: Jeffrey [name] MRN: [id number] DOB:12-Feb-1956, 68 y.o., male Today's Date: 05/10/2024    END OF SESSION:  PT End of Session - 05/10/24 1354     Visit Number 58    Date for Recertification  06/16/24    Authorization Type HTA    Progress Note Due on Visit 60    PT Start Time 1400    PT Stop Time 1442    PT Time Calculation (min) 42 min    Equipment Utilized During Treatment Gait belt    Activity Tolerance Patient tolerated treatment well                     Past Medical History:  Diagnosis Date   Cardiac arrest ([name]) 05/22/2016   Cataract    CHF (congestive heart failure) ([name])    CKD (chronic kidney disease) stage 3, GFR 30-59 ml/min ([name]) 05/28/2021   CKD (chronic kidney disease) stage 4, GFR 15-29 ml/min ([name]) 05/28/2021   Diabetes mellitus without complication ([name])    type 2   Diabetic retinopathy ([name])    Hyperlipidemia    Hypertension    Memory loss    mild   Myocardial infarct ([name]) 2105   Retinopathy    Both eyes   S/P primary angioplasty with coronary stent 05/22/2016   Sleep-disordered breathing 12/31/2023   Vitamin B12 deficiency    Vitreous hemorrhage of left eye ([name])    proliferative diabetic retinopathy   Past Surgical History:  Procedure Laterality Date   ANGIOPLASTY     2015   COLONOSCOPY     multiple   EYE SURGERY     PARS PLANA VITRECTOMY Left 03/15/2020   Procedure: PARS PLANA VITRECTOMY WITH 25 GAUGE;  Surgeon: Jarold Mayo, MD;  Location: [facility];  Service: Ophthalmology;  Laterality: Left;   PHOTOCOAGULATION WITH LASER Left 03/15/2020   Procedure: PHOTOCOAGULATION WITH LASER; INTRAVITREAL INJECTION OF AVASTIN ;  Surgeon: Jarold Mayo, MD;  Location: [facility] OR;  Service: Ophthalmology;  Laterality: Left;   REFRACTIVE SURGERY     UPPER GI ENDOSCOPY     several   VITRECTOMY     Patient Active Problem List   Diagnosis Date Noted   Sleep-disordered breathing  12/31/2023   Actinic keratosis 11/30/2023   Vitamin D  deficiency 11/30/2023   Rhinitis 05/20/2023   Cervical spondylosis 08/26/2022   CKD (chronic kidney disease) stage 4, GFR 15-29 ml/min ([name]) 05/28/2021   Adjustment disorder 01/22/2021   Chronic back pain 10/22/2020   Peripheral vertigo 05/15/2020   Dermatitis 01/04/2020   Unintentional weight loss with loose stools 05/19/2019   Low vitamin B12 level 03/29/2019   Heme positive stool 03/14/2019   Normocytic anemia 03/14/2019   Chronic diastolic heart failure ([name]) 02/08/2019   Diabetic retinopathy ([name]) 09/08/2018   Physical debility 05/24/2018   Varicose veins of both lower extremities 03/01/2018   Fatigue due to exertion 10/14/2017   GERD (gastroesophageal reflux disease) 08/25/2017   CAD in native artery 04/14/2017   Hyperlipidemia associated with type 2 diabetes mellitus ([name]) 04/14/2017   Diabetic peripheral neuropathy associated with type 2 diabetes mellitus ([name]) 08/17/2016   [name] 05/02/2016   Hypertension associated with diabetes ([name]) 05/02/2016    PCP: Kennyth Worth HERO, MD   REFERRING PROVIDER: Kennyth Worth HERO, MD   REFERRING DIAG: R53.81 (ICD-10-CM) - Physical debility   THERAPY DIAG:  Other abnormalities of gait and mobility  Difficulty in  walking, not elsewhere classified  Muscle weakness (generalized)  Rationale for Evaluation and Treatment: Rehabilitation  ONSET DATE: 2022  SUBJECTIVE:   SUBJECTIVE STATEMENT: Had the spinal tap and is going to move forward with brain surgery in October.  PT evaluated me before and afterwards.  Afterwards I was able to walk faster.  I was going to bring the walker but I have too much on my mind.    Arrives via transport chair.     transportion by [name]  Brother: [name] identical twins  PERTINENT HISTORY: 3/11: evidence of lumbar compression deformity Compression fx L1 2022, CAD, CKD (stage 4), DM, cardiac arrest 2017,  neuropathy, retinopathy Long history of gait abnormality beginning back in 2022. He has a history of CAD and believes that his debility began following a covid infection. In December odf 2022 he fell and suffered a compression fracture of L1.  PAIN:  Are you having pain? no  PRECAUTIONS: Fall  RED FLAGS: None   WEIGHT BEARING RESTRICTIONS: No  FALLS:  Has patient fallen in last 6 months? No and with sit to stand falls back sometimes  LIVING ENVIRONMENT: Lives with: lives with their family Lives in: House/apartment Stairs: No Has following equipment at home: Single point cane, Environmental consultant - 2 wheeled, Environmental consultant - 4 wheeled, shower chair, Grab bars, and stand up walker also; also has shower chair at sink for shaving, brushing teeth etc, also has transporter.  OCCUPATION: retired  PLOF: Independent with household mobility with device  PATIENT GOALS: to walk without a walker  NEXT MD VISIT: 09/28/22  OBJECTIVE:  Note: Objective measures were completed at Evaluation unless otherwise noted.  DIAGNOSTIC FINDINGS: N/A  COGNITION: Overall cognitive status: Within functional limits for tasks assessed     SENSATION: Neuropathy in B feet   MUSCLE LENGTH: Marked HS R>L, hip flexor tightness   POSTURE: rounded shoulders and forward head  LOWER EXTREMITY ROM: WFL for tasks assessed  LOWER EXTREMITY MMT:  MMT Right eval Left eval 4/1  5/27 9/4  Hip flexion 4 4 4  Bil 4 Bil 4 Bil  Hip extension Able to bridge Able to bridge 4- Bil 4- Bil 4- Bil  Hip abduction 4-* 4-* 4- Bil 4- Bil 4- Bil  Hip adduction       Hip internal rotation       Hip external rotation       Knee flexion       Knee extension 4 4+ 4- Bil 4- Bil 4- Bil  Ankle dorsiflexion 4+ 4- 4- Bil 4- Bil 4- Bil  Ankle plantarflexion       Ankle inversion       Ankle eversion        (Blank rows = not tested)  3/20:  max UE assist to rise sit to stand; LE hip extension, knee extension grossly 3+ to 4-/5  FUNCTIONAL  TESTS:  30 seconds chair stand test Timed up and go (TUG): 59.36 with 4WW Stance time: 45 sec patient reports unsteadiness and feels he goes backwards 07/27/2023 92 ft decreased cadence; decreased step length; absent heel strike  12/26: 5x STS: unable to rise without heavy UE use; attempted to use armrests for test but does not come all the way up;  next trial with chair +cushion to touch at least one RW handle 48.2 sec:   TUG: cushion in seat +arm use and RW:  52.49 sec  08/13/23 attempted 5x STS but pt unable to complete: 1st rep does not come  all the way up and then plops heavily into the transport wheelchair (he then recounts how this happened at home and how the chair tipped backwards) unable to complete reps Unable to complete TUG today, he does not have his 4 wheeled walker today and feels he is slower with the clinic RW  09/15/2023 TUG: 1:03 with rollator 5STS:48.55 sec heavy reliance on UE support; last repetition was a challenge  3/20: Heavy UE reliance with STS (unable to push up from [facility] armrests, pulls up from // bars) 4/1: max UE assist with sit to stand  5/27:  5x STS no cushion: able to complete 4 reps 47.19 sec            5x STS with cushion 22.5 seat height 5x 43.09 sec able to push up from [facility] armrests rather than // bars (much less reliance on Ues to rise)  7/24: 5x STS with cushion 53.30 needs UE assist from [facility], 2nd trial 32.22 push from [facility] armrests  04/06/2024 5STS: 25.38 sec with UE support pushing from armrest (w/c + airex pad)  04/21/2024 5STS:23.54 sec max UE support (last rep was challenging)  GAIT: Distance walked: 25 Assistive device utilized: Walker - 4 wheeled Level of assistance: Modified independence Comments: slow speed, decreased step and stride, decreased heel strike, forward trunk lean.  TRANSFERS: Sit to stand: Uses walker to pull up; stand to sit: does not fully back up to chair and does not reach back; Supine to sit: needs min A for log roll   (has some back pain with sit <>supine due to sitting upward from supine) also fear of falling from bed, sit to supine needs min A to lift legs on to mat table The Patient-Specific Functional Scale  Initial:  I am going to ask you to identify up to 3 important activities that you are unable to do or are having difficulty with as a result of this problem.  Today are there any activities that you are unable to do or having difficulty with because of this?  (Patient shown scale and patient rated each activity)  Follow up: When you first came in you had difficulty performing these activities.  Today do you still have difficulty?  Patient-Specific activity scoring scheme (Point to one number):  0 1 2 3 4 5 6 7 8 9  10 Unable                                                                                                          Able to perform To perform  activity at the same Activity         Level as before                                                                                                                       Injury or problem  Activity            Getting out of the recliner                                                                  Initial:                    5/27:  4    7/24: 4-5   8/27: 6      9/11: 5  2.                   Walking with RW 40 feet                                                              Initial:1                                 7/24: 6-7    8/27: 6      9/11:6  3.                    Standing to brush teeth                                                           Initial:       0 (has to do sitting on shower chair)     5/27: 3       7/24: 4-5   8/27: 0(sits in shower chair)    9/11:6(able to stand but he gets fatigued. He keeps a shower chair behind him)  4. Putting on socks and shoes  while sitting                                                      initial  2-3  7/24:   5   8/27: 0(his brother helps him)    9/11: 0 (His brother helps him)  TODAY'S TREATMENT:  05/10/24: Nu-Step L5 7 min UE/LE while discussing status  Leg press seat 8 55# bil 2x12 Lat pulls 10# at dual cable (higher anchor point)  2 x 10 Dynamic balance: Static standing with UE reaches in multi planes, trunk rotation, head movements Gait training in // with gait belt x  8  laps 2 rounds with emphasis on longer step length and upright posture 05/05/24: Nu-Step L4 7 min UE/LE while discussing status average 57-59 SPM  Gait with 4 WW: 1st round 89 feet in 3 min; 2nd round 207 feet in 8:18 minutes RPE (Rating of Perceived Exertion):   7  /10 LAQ 7# 10x2 Seated 7# hip flexion 7x (fatigues quickly) Seated 2# overhead press 10x right/left  Chair sit ups 10x (difficult on last few reps)  Seated 5# kettlebell hip to hip core 10x  Gait with 4 WW out of clinic and min assist down handicap ramp; supervision and verbal cues for transfer into the car  05/03/24: Nu-Step L5 6 min UE/LE while discussing status (cues to keep SPM above 45-50 especially for last minute) Leg press seat 8 50# bil 2x10 Dynamic balance: no UE support with head turns, trunk rotation, UE reaches Gait with Hand-held assist 14.2 feet with close follow of wheelchair  Gait training in // with gait belt x 10  laps with emphasis on longer step length and upright posture, able to do several laps without UE use on railing but needs UE support for turning around Lat pulls 10# at dual cable (higher anchor point)  2 x 10  04/28/24: Nu-Step L5 6 min UE/LE while discussing status (cues to keep SPM above 45) Sit to stand pushing from transport chair 6x 5# Kettlebell 2 hands  dead lift to knee level  picking up his cat 5x without UE support (cue for forward gaze improves balance stability-tends to tip forward when looking down) Dynamic balance: bean bag toss to coordinating color no UE support Gait training in // with  gait belt x 10  laps with emphasis on longer step length and upright posture, able to do several laps without UE use on railing but needs UE support for turning around Backwards walking in // bars 2 laps needs bil UE support High step walking with single arm support only 2 laps Lat pulls 10# at dual cable (higher anchor point)  2 x 10 LAQ  7# 2 rounds 10x right/left Seated march with 7# ankle weights 10x (difficult)  Sesated 7# hip abduction/external rotation 10x right/left (difficult)  04/26/24: Nu-Step L5 6 min UE/LE while discussing status (cues to keep SPM above 45) Sit to stand pushing from transport chair 5x 5# Kettlebell rows to simulate picking up his cat 10x right and left without UE support Walk in // bars with single side 5# KB hold 4 laps to simulate carrying a shopping bag Dynamic balance: Static standing with UE reaches in multi planes, trunk rotation, high stepping Blue band trunk extension 5 sec holds 10x (anchored by therapist) Blue band at waist level anchored on // bars with resisted walk forward against band 10x Gait training in // with gait belt x 10  laps with emphasis on longer step length and upright posture, able to do several laps without UE use on railing but needs UE support for turning around Backwards walking in // bars 2 laps needs bil UE  support Lat pulls 10# at dual cable (higher anchor point)  2 x 10 LAQ 4# 12x; increased to 6# 10x right/left   PATIENT EDUCATION:  Education details: PT eval findings, anticipated POC, initial HEP, postural awareness, and transfer safety  Person educated: Patient and brother Education method: Explanation, Demonstration, and Handouts Education comprehension: verbalized understanding and returned demonstration  HOME EXERCISE PROGRAM: Access Code: [email or web address] URL: [email or web address] Date: 09/15/2023 Prepared by: Kristeen Sar  Exercises - Sit to Stand with Counter Support  - 3 x daily - 3-4 x weekly - 1 sets  - 1-5 reps - Hooklying Clamshell with Resistance  - 2 x daily - 3-4 x weekly - 1 sets - 10 reps - Seated Long Arc Quad  - 1 x daily - 7 x weekly - 1-3 sets - 10 reps - 5 sec hold - Standing Hip Abduction with Counter Support  - 1 x daily - 7 x weekly - 1-2 sets - 10 reps - Standing March with Counter Support  - 1 x daily - 7 x weekly - 1-2 sets - 10 reps - Standing Heel Raise with Support  - 1 x daily - 7 x weekly - 1-2 sets - 10 reps - Shoulder External Rotation and Scapular Retraction with Resistance  - 1 x daily - 7 x weekly - 2 sets - 10 reps - Sit to Stand with Armchair  - 1 x daily - 7 x weekly - 1 sets - 5 reps  Patient Education - Tips to reduce freezing episodes with standing or walking   ASSESSMENT:  CLINICAL IMPRESSION: Able to increase resistance on the leg press as well as repetition number for strengthening Les needed for greater ease with sit to stand.  Verbal cues needed for longer step lengths.  Some difficulty initiating movement with walking and reduced foot clearance causing his toes to catch the floor periodically.  CGA for safety with transfers and gait.      OBJECTIVE IMPAIRMENTS: Abnormal gait, decreased balance, decreased ROM, decreased strength, decreased safety awareness, dizziness, impaired flexibility, impaired sensation, postural dysfunction, and pain.   ACTIVITY LIMITATIONS: carrying, lifting, bending, standing, squatting, stairs, transfers, bed mobility, continence, bathing, and locomotion level  PARTICIPATION LIMITATIONS: shopping and community activity  PERSONAL FACTORS: Age, Fitness, Time since onset of injury/illness/exacerbation, and 3+ comorbidities: Compression fx L1 2022, CAD, CKD (stage 4), neuropathy, retinopathy are also affecting patient's functional outcome.   REHAB POTENTIAL: Good  CLINICAL DECISION MAKING: Unstable/unpredictable  EVALUATION COMPLEXITY: High   GOALS: Goals reviewed with patient? Yes  SHORT TERM GOALS: Target date:  07/30/23 Improved TUG to < 25 sec to decrease fall risk.  Baseline: Goal status: deferred  2.  Able to perform 5XSTS without UE assist and controlled descent showing improved LE strength.  Baseline:  Goal status: deferred 3.  Patient will be able to stand safely for 2 min without UE assist Baseline:  Goal status: met 5/27  LONG TERM GOALS: Target date: 06/16/2024  Ind with advanced HEP Baseline:  Goal status: IN PROGRESS   2.  Sit to stand from the recliner with PSFS score of 5 Baseline:  Goal status:  met 7/24  3.  Gait with RW from the bedroom to the sunroom 40-50 feet with PSFS of 3 Baseline:  Goal status: met 7/24  4. Improved LE mobility needed for greater ease with putting on socks and shoes in sitting with PSFS of 5 Baseline:  Goal status: met 7/24  5.  Patient will be able to  stand long enough to brush teeth PSFS of 2 Baseline:  Goal status: met 5/27  6. Able to rise from 22 inch seat height push from the armrests to use the new chairs in the living room (in July) Goal status: met 7/24  7. Patient will be able to stand for 12 minutes needed for grooming and dressing tasks at home    Goal status: ongoing 04/06/2024                      8. The patient will have improved LE strength with 5x STS test with UE assist improved to 25 sec MET 8/27/225                       9. The patient will be able to walk for 6 min with UE support with RW or // bars                           Ongoing 04/06/2024  PLAN:  PT FREQUENCY: 2x week  PT DURATION: 8 weeks  PLANNED INTERVENTIONS: 97164- PT Re-evaluation, 97110-Therapeutic exercises, 97530- Therapeutic activity, 97112- Neuromuscular re-education, 97535- Self Care, [id number]- Manual therapy, [id number]- Gait training, [phone]- Aquatic Therapy, 97014- Electrical stimulation (unattended), Patient/Family education, Balance training, Stair training, Taping, Joint mobilization, Spinal mobilization, DME instructions, Cryotherapy, and Moist  heat  PLAN FOR NEXT SESSION: upcoming surgery for shunt placement for NPH;  gait with RW in clinic for endurance;  Nu-Step; dual cable rows;  sit to stand from foam pad; static standing with teal power cord isometric trunk extension;  UE movements while standing for ADLs; gait in // bars working on standing erect, longer step lengths;  assess energy levels and endurance ; functional strengthening; pt has a goal to lift/carry his cat  Glade Pesa, PT 05/10/24 8:59 PM Phone: [phone] Fax: [phone]

## 2024-05-12 ENCOUNTER — Ambulatory Visit: Attending: Family Medicine | Admitting: Physical Therapy

## 2024-05-12 DIAGNOSIS — R2689 Other abnormalities of gait and mobility: Secondary | ICD-10-CM | POA: Diagnosis not present

## 2024-05-12 DIAGNOSIS — M6281 Muscle weakness (generalized): Secondary | ICD-10-CM | POA: Diagnosis not present

## 2024-05-12 DIAGNOSIS — R262 Difficulty in walking, not elsewhere classified: Secondary | ICD-10-CM | POA: Insufficient documentation

## 2024-05-12 NOTE — Therapy (Signed)
 OUTPATIENT PHYSICAL THERAPY LOWER EXTREMITY TREATMENT    Patient Name: Jeffrey [name] MRN: [id number] DOB:12-05-1955, 68 y.o., male Today's Date: 05/12/2024    END OF SESSION:  PT End of Session - 05/12/24 1356     Visit Number 59    Date for Recertification  06/16/24    Authorization Type HTA    Progress Note Due on Visit 60    PT Start Time 1400    PT Stop Time 1442    PT Time Calculation (min) 42 min    Equipment Utilized During Treatment Gait belt    Activity Tolerance Patient tolerated treatment well                     Past Medical History:  Diagnosis Date   Cardiac arrest ([name]) 05/22/2016   Cataract    CHF (congestive heart failure) ([name])    CKD (chronic kidney disease) stage 3, GFR 30-59 ml/min ([name]) 05/28/2021   CKD (chronic kidney disease) stage 4, GFR 15-29 ml/min ([name]) 05/28/2021   Diabetes mellitus without complication ([name])    type 2   Diabetic retinopathy ([name])    Hyperlipidemia    Hypertension    Memory loss    mild   Myocardial infarct ([name]) 2105   Retinopathy    Both eyes   S/P primary angioplasty with coronary stent 05/22/2016   Sleep-disordered breathing 12/31/2023   Vitamin B12 deficiency    Vitreous hemorrhage of left eye ([name])    proliferative diabetic retinopathy   Past Surgical History:  Procedure Laterality Date   ANGIOPLASTY     2015   COLONOSCOPY     multiple   EYE SURGERY     PARS PLANA VITRECTOMY Left 03/15/2020   Procedure: PARS PLANA VITRECTOMY WITH 25 GAUGE;  Surgeon: Jarold Mayo, MD;  Location: [facility] OR;  Service: Ophthalmology;  Laterality: Left;   PHOTOCOAGULATION WITH LASER Left 03/15/2020   Procedure: PHOTOCOAGULATION WITH LASER; INTRAVITREAL INJECTION OF AVASTIN ;  Surgeon: Jarold Mayo, MD;  Location: [facility] OR;  Service: Ophthalmology;  Laterality: Left;   REFRACTIVE SURGERY     UPPER GI ENDOSCOPY     several   VITRECTOMY     Patient Active Problem List   Diagnosis Date Noted   Sleep-disordered breathing  12/31/2023   Actinic keratosis 11/30/2023   Vitamin D  deficiency 11/30/2023   Rhinitis 05/20/2023   Cervical spondylosis 08/26/2022   CKD (chronic kidney disease) stage 4, GFR 15-29 ml/min ([name]) 05/28/2021   Adjustment disorder 01/22/2021   Chronic back pain 10/22/2020   Peripheral vertigo 05/15/2020   Dermatitis 01/04/2020   Unintentional weight loss with loose stools 05/19/2019   Low vitamin B12 level 03/29/2019   Heme positive stool 03/14/2019   Normocytic anemia 03/14/2019   Chronic diastolic heart failure ([name]) 02/08/2019   Diabetic retinopathy ([name]) 09/08/2018   Physical debility 05/24/2018   Varicose veins of both lower extremities 03/01/2018   Fatigue due to exertion 10/14/2017   GERD (gastroesophageal reflux disease) 08/25/2017   CAD in native artery 04/14/2017   Hyperlipidemia associated with type 2 diabetes mellitus ([name]) 04/14/2017   Diabetic peripheral neuropathy associated with type 2 diabetes mellitus ([name]) 08/17/2016   [name] 05/02/2016   Hypertension associated with diabetes ([name]) 05/02/2016    PCP: [place] HERO, MD   REFERRING PROVIDER: [place] HERO, MD   REFERRING DIAG: R53.81 (ICD-10-CM) - Physical debility   THERAPY DIAG:  Other abnormalities of gait and mobility  Difficulty in  walking, not elsewhere classified  Muscle weakness (generalized)  Rationale for Evaluation and Treatment: Rehabilitation  ONSET DATE: 2022  SUBJECTIVE:   SUBJECTIVE STATEMENT: Arrives via transport chair with his brother [name].  His surgical consult is next Friday 10/10.   Brother: [name] identical twins  PERTINENT HISTORY: 3/11: evidence of lumbar compression deformity Compression fx L1 2022, CAD, CKD (stage 4), DM, cardiac arrest 2017, neuropathy, retinopathy Long history of gait abnormality beginning back in 2022. He has a history of CAD and believes that his debility began following a covid infection. In December odf 2022 he  fell and suffered a compression fracture of L1.  PAIN:  Are you having pain? no  PRECAUTIONS: Fall  RED FLAGS: None   WEIGHT BEARING RESTRICTIONS: No  FALLS:  Has patient fallen in last 6 months? No and with sit to stand falls back sometimes  LIVING ENVIRONMENT: Lives with: lives with their family Lives in: House/apartment Stairs: No Has following equipment at home: Single point cane, Environmental consultant - 2 wheeled, Environmental consultant - 4 wheeled, shower chair, Grab bars, and stand up walker also; also has shower chair at sink for shaving, brushing teeth etc, also has transporter.  OCCUPATION: retired  PLOF: Independent with household mobility with device  PATIENT GOALS: to walk without a walker  NEXT MD VISIT: 09/28/22  OBJECTIVE:  Note: Objective measures were completed at Evaluation unless otherwise noted.  DIAGNOSTIC FINDINGS: N/A  COGNITION: Overall cognitive status: Within functional limits for tasks assessed     SENSATION: Neuropathy in B feet   MUSCLE LENGTH: Marked HS R>L, hip flexor tightness   POSTURE: rounded shoulders and forward head  LOWER EXTREMITY ROM: WFL for tasks assessed  LOWER EXTREMITY MMT:  MMT Right eval Left eval 4/1  5/27 9/4  Hip flexion 4 4 4  Bil 4 Bil 4 Bil  Hip extension Able to bridge Able to bridge 4- Bil 4- Bil 4- Bil  Hip abduction 4-* 4-* 4- Bil 4- Bil 4- Bil  Hip adduction       Hip internal rotation       Hip external rotation       Knee flexion       Knee extension 4 4+ 4- Bil 4- Bil 4- Bil  Ankle dorsiflexion 4+ 4- 4- Bil 4- Bil 4- Bil  Ankle plantarflexion       Ankle inversion       Ankle eversion        (Blank rows = not tested)  3/20:  max UE assist to rise sit to stand; LE hip extension, knee extension grossly 3+ to 4-/5  FUNCTIONAL TESTS:  30 seconds chair stand test Timed up and go (TUG): 59.36 with 4WW Stance time: 45 sec patient reports unsteadiness and feels he goes backwards 07/27/2023 92 ft decreased cadence;  decreased step length; absent heel strike  12/26: 5x STS: unable to rise without heavy UE use; attempted to use armrests for test but does not come all the way up;  next trial with chair +cushion to touch at least one RW handle 48.2 sec:   TUG: cushion in seat +arm use and RW:  52.49 sec  08/13/23 attempted 5x STS but pt unable to complete: 1st rep does not come all the way up and then plops heavily into the transport wheelchair (he then recounts how this happened at home and how the chair tipped backwards) unable to complete reps Unable to complete TUG today, he does not have his 4 wheeled walker  today and feels he is slower with the clinic RW  09/15/2023 TUG: 1:03 with rollator 5STS:48.55 sec heavy reliance on UE support; last repetition was a challenge  3/20: Heavy UE reliance with STS (unable to push up from [facility] armrests, pulls up from // bars) 4/1: max UE assist with sit to stand  5/27:  5x STS no cushion: able to complete 4 reps 47.19 sec            5x STS with cushion 22.5 seat height 5x 43.09 sec able to push up from [facility] armrests rather than // bars (much less reliance on Ues to rise)  7/24: 5x STS with cushion 53.30 needs UE assist from [facility], 2nd trial 32.22 push from [facility] armrests  04/06/2024 5STS: 25.38 sec with UE support pushing from armrest (w/c + airex pad)  04/21/2024 5STS:23.54 sec max UE support (last rep was challenging)  GAIT: Distance walked: 25 Assistive device utilized: Walker - 4 wheeled Level of assistance: Modified independence Comments: slow speed, decreased step and stride, decreased heel strike, forward trunk lean.  TRANSFERS: Sit to stand: Uses walker to pull up; stand to sit: does not fully back up to chair and does not reach back; Supine to sit: needs min A for log roll  (has some back pain with sit <>supine due to sitting upward from supine) also fear of falling from bed, sit to supine needs min A to lift legs on to mat table The Patient-Specific Functional  Scale  Initial:  I am going to ask you to identify up to 3 important activities that you are unable to do or are having difficulty with as a result of this problem.  Today are there any activities that you are unable to do or having difficulty with because of this?  (Patient shown scale and patient rated each activity)  Follow up: When you first came in you had difficulty performing these activities.  Today do you still have difficulty?  Patient-Specific activity scoring scheme (Point to one number):  0 1 2 3 4 5 6 7 8 9  10 Unable                                                                                                          Able to perform To perform                                                                                                    activity at the same Activity         Level as before  Injury or problem  Activity            Getting out of the recliner                                                                  Initial:                    5/27:  4    7/24: 4-5   8/27: 6      9/11: 5  2.                   Walking with RW 40 feet                                                              Initial:1                                 7/24: 6-7    8/27: 6      9/11:6  3.                    Standing to brush teeth                                                           Initial:       0 (has to do sitting on shower chair)     5/27: 3       7/24: 4-5   8/27: 0(sits in shower chair)    9/11:6(able to stand but he gets fatigued. He keeps a shower chair behind him)  4. Putting on socks and shoes  while sitting                                                      initial 2-3              7/24:   5   8/27: 0(his brother helps him)    9/11: 0 (His brother helps him)  TODAY'S TREATMENT:  05/12/24: Nu-Step L5 6 min UE/LE while discussing status  Leg press seat  8 55# bil x12; single leg 30# 12x verbal cues to control eccentrically Lat pulls 10# at dual cable (higher anchor point)  2 x 10 Dynamic balance: Static standing with UE reaches in multi planes, trunk rotation, head movements Gait training in // with gait belt x  8  laps with emphasis on longer step length and upright posture  05/10/24: Nu-Step L5 7 min UE/LE while discussing status  Leg press seat 8 55# bil 2x12 Lat pulls 10# at dual cable (higher anchor point)  2 x  10 Dynamic balance: Static standing with UE reaches in multi planes, trunk rotation, head movements Gait training in // with gait belt x  8  laps 2 rounds with emphasis on longer step length and upright posture 05/05/24: Nu-Step L4 7 min UE/LE while discussing status average 57-59 SPM  Gait with 4 WW: 1st round 89 feet in 3 min; 2nd round 207 feet in 8:18 minutes RPE (Rating of Perceived Exertion):   7  /10 LAQ 7# 10x2 Seated 7# hip flexion 7x (fatigues quickly) Seated 2# overhead press 10x right/left  Chair sit ups 10x (difficult on last few reps)  Seated 5# kettlebell hip to hip core 10x  Gait with 4 WW out of clinic and min assist down handicap ramp; supervision and verbal cues for transfer into the car  05/03/24: Nu-Step L5 6 min UE/LE while discussing status (cues to keep SPM above 45-50 especially for last minute) Leg press seat 8 50# bil 2x10 Dynamic balance: no UE support with head turns, trunk rotation, UE reaches Gait with Hand-held assist 14.2 feet with close follow of wheelchair  Gait training in // with gait belt x 10  laps with emphasis on longer step length and upright posture, able to do several laps without UE use on railing but needs UE support for turning around Lat pulls 10# at dual cable (higher anchor point)  2 x 10  04/28/24: Nu-Step L5 6 min UE/LE while discussing status (cues to keep SPM above 45) Sit to stand pushing from transport chair 6x 5# Kettlebell 2 hands  dead lift to knee level  picking up  his cat 5x without UE support (cue for forward gaze improves balance stability-tends to tip forward when looking down) Dynamic balance: bean bag toss to coordinating color no UE support Gait training in // with gait belt x 10  laps with emphasis on longer step length and upright posture, able to do several laps without UE use on railing but needs UE support for turning around Backwards walking in // bars 2 laps needs bil UE support High step walking with single arm support only 2 laps Lat pulls 10# at dual cable (higher anchor point)  2 x 10 LAQ  7# 2 rounds 10x right/left Seated march with 7# ankle weights 10x (difficult)  Sesated 7# hip abduction/external rotation 10x right/left (difficult)  PATIENT EDUCATION:  Education details: PT eval findings, anticipated POC, initial HEP, postural awareness, and transfer safety  Person educated: Patient and brother Education method: Explanation, Demonstration, and Handouts Education comprehension: verbalized understanding and returned demonstration  HOME EXERCISE PROGRAM: Access Code: [email or web address] URL: [email or web address] Date: 09/15/2023 Prepared by: Kristeen Sar  Exercises - Sit to Stand with Counter Support  - 3 x daily - 3-4 x weekly - 1 sets - 1-5 reps - Hooklying Clamshell with Resistance  - 2 x daily - 3-4 x weekly - 1 sets - 10 reps - Seated Long Arc Quad  - 1 x daily - 7 x weekly - 1-3 sets - 10 reps - 5 sec hold - Standing Hip Abduction with Counter Support  - 1 x daily - 7 x weekly - 1-2 sets - 10 reps - Standing March with Counter Support  - 1 x daily - 7 x weekly - 1-2 sets - 10 reps - Standing Heel Raise with Support  - 1 x daily - 7 x weekly - 1-2 sets - 10 reps - Shoulder External Rotation and Scapular Retraction with Resistance  - 1 x daily -  7 x weekly - 2 sets - 10 reps - Sit to Stand with Armchair  - 1 x daily - 7 x weekly - 1 sets - 5 reps  Patient Education - Tips to reduce freezing episodes with standing or  walking   ASSESSMENT:  CLINICAL IMPRESSION: CGA for transfers and cues to push up from the wheelchair armrests.  He does use the footplate to rise for transferring on/off the leg press.  Verbal cues for full knee extension and to control the eccentric return on the leg press. He reports moderate fatigue end of session.  Gait pattern of shuffled steps, difficulty turning and pauses in walking characteristic of NPH.  Patient is considering surgical intervention and will see the neurosurgeon for consult next week.     OBJECTIVE IMPAIRMENTS: Abnormal gait, decreased balance, decreased ROM, decreased strength, decreased safety awareness, dizziness, impaired flexibility, impaired sensation, postural dysfunction, and pain.   ACTIVITY LIMITATIONS: carrying, lifting, bending, standing, squatting, stairs, transfers, bed mobility, continence, bathing, and locomotion level  PARTICIPATION LIMITATIONS: shopping and community activity  PERSONAL FACTORS: Age, Fitness, Time since onset of injury/illness/exacerbation, and 3+ comorbidities: Compression fx L1 2022, CAD, CKD (stage 4), neuropathy, retinopathy are also affecting patient's functional outcome.   REHAB POTENTIAL: Good  CLINICAL DECISION MAKING: Unstable/unpredictable  EVALUATION COMPLEXITY: High   GOALS: Goals reviewed with patient? Yes  SHORT TERM GOALS: Target date: 07/30/23 Improved TUG to < 25 sec to decrease fall risk.  Baseline: Goal status: deferred  2.  Able to perform 5XSTS without UE assist and controlled descent showing improved LE strength.  Baseline:  Goal status: deferred 3.  Patient will be able to stand safely for 2 min without UE assist Baseline:  Goal status: met 5/27  LONG TERM GOALS: Target date: 06/16/2024  Ind with advanced HEP Baseline:  Goal status: IN PROGRESS   2.  Sit to stand from the recliner with PSFS score of 5 Baseline:  Goal status:  met 7/24  3.  Gait with RW from the bedroom to the sunroom  40-50 feet with PSFS of 3 Baseline:  Goal status: met 7/24  4. Improved LE mobility needed for greater ease with putting on socks and shoes in sitting with PSFS of 5 Baseline:  Goal status: met 7/24  5.  Patient will be able to stand long enough to brush teeth PSFS of 2 Baseline:  Goal status: met 5/27  6. Able to rise from 22 inch seat height push from the armrests to use the new chairs in the living room (in July) Goal status: met 7/24  7. Patient will be able to stand for 12 minutes needed for grooming and dressing tasks at home    Goal status: ongoing 04/06/2024                      8. The patient will have improved LE strength with 5x STS test with UE assist improved to 25 sec MET 8/27/225                       9. The patient will be able to walk for 6 min with UE support with RW or // bars                           Ongoing 04/06/2024  PLAN:  PT FREQUENCY: 2x week  PT DURATION: 8 weeks  PLANNED INTERVENTIONS: [id number]- PT Re-evaluation, 97110-Therapeutic exercises, 97530-  Therapeutic activity, [id number]- Neuromuscular re-education, [phone]- Self Care, [id number]- Manual therapy, [phone]- Gait training, [phone]- Aquatic Therapy, [phone]- Electrical stimulation (unattended), Patient/Family education, Balance training, Stair training, Taping, Joint mobilization, Spinal mobilization, DME instructions, Cryotherapy, and Moist heat  PLAN FOR NEXT SESSION: 60th visit progress note; consult next Friday 10/10 regarding upcoming surgery for shunt placement for NPH;  gait with RW in clinic for endurance;  Nu-Step; dual cable rows;  sit to stand from foam pad; static standing with teal power cord isometric trunk extension;  UE movements while standing for ADLs; gait in // bars working on standing erect, longer step lengths;  assess energy levels and endurance ; functional strengthening  Glade Pesa, PT 05/12/24 9:45 PM Phone: [phone] Fax:  [phone]

## 2024-05-16 ENCOUNTER — Telehealth: Payer: Self-pay | Admitting: Physical Therapy

## 2024-05-16 NOTE — Telephone Encounter (Signed)
 Returned patient phone calls and numerous messages regarding a conversation last visit and  if there was anything needed to take to his doctor's appt.  Discussed if he has a surgical procedure we would discharge from PT at that time and his doctor would send a new PT referral for evaluation after surgery.

## 2024-05-17 ENCOUNTER — Ambulatory Visit: Admitting: Physical Therapy

## 2024-05-17 ENCOUNTER — Encounter: Payer: Self-pay | Admitting: Physical Therapy

## 2024-05-17 DIAGNOSIS — R2689 Other abnormalities of gait and mobility: Secondary | ICD-10-CM | POA: Diagnosis not present

## 2024-05-17 DIAGNOSIS — R262 Difficulty in walking, not elsewhere classified: Secondary | ICD-10-CM

## 2024-05-17 DIAGNOSIS — M6281 Muscle weakness (generalized): Secondary | ICD-10-CM

## 2024-05-17 NOTE — Therapy (Addendum)
 OUTPATIENT PHYSICAL THERAPY LOWER EXTREMITY TREATMENT/ DISCHARGE NOTE   Progress Note Reporting Period 04/14/2024 to 05/17/2024  See note below for Objective Data and Assessment of Progress/Goals.     Patient Name: Jeffrey [name] MRN: [id number] DOB:28-Aug-1955, 68 y.o., male Today's Date: 05/17/2024    END OF SESSION:  PT End of Session - 05/17/24 1505     Visit Number 60    Date for Recertification  06/16/24    Authorization Type HTA    Progress Note Due on Visit 70    PT Start Time 1400    PT Stop Time 1445    PT Time Calculation (min) 45 min    Equipment Utilized During Treatment Gait belt    Activity Tolerance Patient tolerated treatment well    Behavior During Therapy WFL for tasks assessed/performed                      Past Medical History:  Diagnosis Date   Cardiac arrest ([name]) 05/22/2016   Cataract    CHF (congestive heart failure) ([name])    CKD (chronic kidney disease) stage 3, GFR 30-59 ml/min ([name]) 05/28/2021   CKD (chronic kidney disease) stage 4, GFR 15-29 ml/min ([name]) 05/28/2021   Diabetes mellitus without complication ([name])    type 2   Diabetic retinopathy ([name])    Hyperlipidemia    Hypertension    Memory loss    mild   Myocardial infarct ([name]) 2105   Retinopathy    Both eyes   S/P primary angioplasty with coronary stent 05/22/2016   Sleep-disordered breathing 12/31/2023   Vitamin B12 deficiency    Vitreous hemorrhage of left eye ([name])    proliferative diabetic retinopathy   Past Surgical History:  Procedure Laterality Date   ANGIOPLASTY     2015   COLONOSCOPY     multiple   EYE SURGERY     PARS PLANA VITRECTOMY Left 03/15/2020   Procedure: PARS PLANA VITRECTOMY WITH 25 GAUGE;  Surgeon: Jarold Mayo, MD;  Location: [facility] OR;  Service: Ophthalmology;  Laterality: Left;   PHOTOCOAGULATION WITH LASER Left 03/15/2020   Procedure: PHOTOCOAGULATION WITH LASER; INTRAVITREAL INJECTION OF AVASTIN ;  Surgeon: Jarold Mayo, MD;  Location: [facility]  OR;  Service: Ophthalmology;  Laterality: Left;   REFRACTIVE SURGERY     UPPER GI ENDOSCOPY     several   VITRECTOMY     Patient Active Problem List   Diagnosis Date Noted   Sleep-disordered breathing 12/31/2023   Actinic keratosis 11/30/2023   Vitamin D  deficiency 11/30/2023   Rhinitis 05/20/2023   Cervical spondylosis 08/26/2022   CKD (chronic kidney disease) stage 4, GFR 15-29 ml/min ([name]) 05/28/2021   Adjustment disorder 01/22/2021   Chronic back pain 10/22/2020   Peripheral vertigo 05/15/2020   Dermatitis 01/04/2020   Unintentional weight loss with loose stools 05/19/2019   Low vitamin B12 level 03/29/2019   Heme positive stool 03/14/2019   Normocytic anemia 03/14/2019   Chronic diastolic heart failure ([name]) 02/08/2019   Diabetic retinopathy ([name]) 09/08/2018   Physical debility 05/24/2018   Varicose veins of both lower extremities 03/01/2018   Fatigue due to exertion 10/14/2017   GERD (gastroesophageal reflux disease) 08/25/2017   CAD in native artery 04/14/2017   Hyperlipidemia associated with type 2 diabetes mellitus ([name]) 04/14/2017   Diabetic peripheral neuropathy associated with type 2 diabetes mellitus ([name]) 08/17/2016   [name] 05/02/2016   Hypertension associated with diabetes ([name]) 05/02/2016    PCP: Kennyth,  Worth HERO, MD   REFERRING PROVIDER: Kennyth Worth HERO, MD   REFERRING DIAG: [phone] (ICD-10-CM) - Physical debility   THERAPY DIAG:  Other abnormalities of gait and mobility  Difficulty in walking, not elsewhere classified  Muscle weakness (generalized)  Rationale for Evaluation and Treatment: Rehabilitation  ONSET DATE: 2022  SUBJECTIVE:   SUBJECTIVE STATEMENT: Patient reports he is doing good today. He is a little anxious about his appointment on Friday.  Brother: [name] identical twins  PERTINENT HISTORY: 3/11: evidence of lumbar compression deformity Compression fx L1 2022, CAD, CKD (stage 4), DM, cardiac  arrest 2017, neuropathy, retinopathy Long history of gait abnormality beginning back in 2022. He has a history of CAD and believes that his debility began following a covid infection. In December odf 2022 he fell and suffered a compression fracture of L1.  PAIN:  Are you having pain? no  PRECAUTIONS: Fall  RED FLAGS: None   WEIGHT BEARING RESTRICTIONS: No  FALLS:  Has patient fallen in last 6 months? No and with sit to stand falls back sometimes  LIVING ENVIRONMENT: Lives with: lives with their family Lives in: House/apartment Stairs: No Has following equipment at home: Single point cane, Environmental consultant - 2 wheeled, Environmental consultant - 4 wheeled, shower chair, Grab bars, and stand up walker also; also has shower chair at sink for shaving, brushing teeth etc, also has transporter.  OCCUPATION: retired  PLOF: Independent with household mobility with device  PATIENT GOALS: to walk without a walker  NEXT MD VISIT: 09/28/22  OBJECTIVE:  Note: Objective measures were completed at Evaluation unless otherwise noted.  DIAGNOSTIC FINDINGS: N/A  COGNITION: Overall cognitive status: Within functional limits for tasks assessed     SENSATION: Neuropathy in B feet   MUSCLE LENGTH: Marked HS R>L, hip flexor tightness   POSTURE: rounded shoulders and forward head  LOWER EXTREMITY ROM: WFL for tasks assessed  LOWER EXTREMITY MMT:  MMT Right eval Left eval 4/1  5/27 9/4  Hip flexion 4 4 4  Bil 4 Bil 4 Bil  Hip extension Able to bridge Able to bridge 4- Bil 4- Bil 4- Bil  Hip abduction 4-* 4-* 4- Bil 4- Bil 4- Bil  Hip adduction       Hip internal rotation       Hip external rotation       Knee flexion       Knee extension 4 4+ 4- Bil 4- Bil 4- Bil  Ankle dorsiflexion 4+ 4- 4- Bil 4- Bil 4- Bil  Ankle plantarflexion       Ankle inversion       Ankle eversion        (Blank rows = not tested)  3/20:  max UE assist to rise sit to stand; LE hip extension, knee extension grossly 3+ to  4-/5  FUNCTIONAL TESTS:  30 seconds chair stand test Timed up and go (TUG): 59.36 with 4WW Stance time: 45 sec patient reports unsteadiness and feels he goes backwards 07/27/2023 92 ft decreased cadence; decreased step length; absent heel strike  12/26: 5x STS: unable to rise without heavy UE use; attempted to use armrests for test but does not come all the way up;  next trial with chair +cushion to touch at least one RW handle 48.2 sec:   TUG: cushion in seat +arm use and RW:  52.49 sec  08/13/23 attempted 5x STS but pt unable to complete: 1st rep does not come all the way up and then plops heavily into  the transport wheelchair (he then recounts how this happened at home and how the chair tipped backwards) unable to complete reps Unable to complete TUG today, he does not have his 4 wheeled walker today and feels he is slower with the clinic RW  09/15/2023 TUG: 1:03 with rollator 5STS:48.55 sec heavy reliance on UE support; last repetition was a challenge  3/20: Heavy UE reliance with STS (unable to push up from [facility] armrests, pulls up from // bars) 4/1: max UE assist with sit to stand  5/27:  5x STS no cushion: able to complete 4 reps 47.19 sec            5x STS with cushion 22.5 seat height 5x 43.09 sec able to push up from [facility] armrests rather than // bars (much less reliance on Ues to rise)  7/24: 5x STS with cushion 53.30 needs UE assist from [facility], 2nd trial 32.22 push from [facility] armrests  04/06/2024 5STS: 25.38 sec with UE support pushing from armrest (w/c + airex pad)  04/21/2024 5STS:23.54 sec max UE support (last rep was challenging)  GAIT: Distance walked: 25 Assistive device utilized: Walker - 4 wheeled Level of assistance: Modified independence Comments: slow speed, decreased step and stride, decreased heel strike, forward trunk lean.  TRANSFERS: Sit to stand: Uses walker to pull up; stand to sit: does not fully back up to chair and does not reach back; Supine to sit: needs  min A for log roll  (has some back pain with sit <>supine due to sitting upward from supine) also fear of falling from bed, sit to supine needs min A to lift legs on to mat table The Patient-Specific Functional Scale  Initial:  I am going to ask you to identify up to 3 important activities that you are unable to do or are having difficulty with as a result of this problem.  Today are there any activities that you are unable to do or having difficulty with because of this?  (Patient shown scale and patient rated each activity)  Follow up: When you first came in you had difficulty performing these activities.  Today do you still have difficulty?  Patient-Specific activity scoring scheme (Point to one number):  0 1 2 3 4 5 6 7 8 9  10 Unable                                                                                                          Able to perform To perform  activity at the same Activity         Level as before                                                                                                                       Injury or problem  Activity            Getting out of the recliner                                                                  Initial:                    5/27:  4    7/24: 4-5   8/27: 6      9/11: 5  2.                   Walking with RW 40 feet                                                              Initial:1                                 7/24: 6-7    8/27: 6      9/11:6  3.                    Standing to brush teeth                                                           Initial:       0 (has to do sitting on shower chair)     5/27: 3       7/24: 4-5   8/27: 0(sits in shower chair)    9/11:6(able to stand but he gets fatigued. He keeps a shower chair behind him)  4. Putting on socks and shoes  while sitting                                                       initial 2-3  7/24:   5   8/27: 0(his brother helps him)    9/11: 0 (His brother helps him)  TODAY'S TREATMENT:  05/17/24: Nu-Step L5 6 min UE/LE while discussing status  Leg press seat 8 55# bil x10; Seated in front of dual cable : biceps curls 7# 2 x 10 then seated row 7# 2 x 10 Step up + weight shift on airex x 8 bilateral in // bars Standing on airex + head turns (up/down & side to side) x 5 each Marching on airex in // bars x 5 bilateral (patient felt like his feet were glued to the floor) Gait training in // with gait belt x  5 laps with emphasis on longer step length and upright posture    05/12/24: Nu-Step L5 6 min UE/LE while discussing status  Leg press seat 8 55# bil x12; single leg 30# 12x verbal cues to control eccentrically Lat pulls 10# at dual cable (higher anchor point)  2 x 10 Dynamic balance: Static standing with UE reaches in multi planes, trunk rotation, head movements Gait training in // with gait belt x  8  laps with emphasis on longer step length and upright posture  05/10/24: Nu-Step L5 7 min UE/LE while discussing status  Leg press seat 8 55# bil 2x12 Lat pulls 10# at dual cable (higher anchor point)  2 x 10 Dynamic balance: Static standing with UE reaches in multi planes, trunk rotation, head movements Gait training in // with gait belt x  8  laps 2 rounds with emphasis on longer step length and upright posture 05/05/24: Nu-Step L4 7 min UE/LE while discussing status average 57-59 SPM  Gait with 4 WW: 1st round 89 feet in 3 min; 2nd round 207 feet in 8:18 minutes RPE (Rating of Perceived Exertion):   7  /10 LAQ 7# 10x2 Seated 7# hip flexion 7x (fatigues quickly) Seated 2# overhead press 10x right/left  Chair sit ups 10x (difficult on last few reps)  Seated 5# kettlebell hip to hip core 10x  Gait with 4 WW out of clinic and min assist down handicap ramp; supervision and verbal cues for transfer into the car   PATIENT EDUCATION:   Education details: PT eval findings, anticipated POC, initial HEP, postural awareness, and transfer safety  Person educated: Patient and brother Education method: Explanation, Demonstration, and Handouts Education comprehension: verbalized understanding and returned demonstration  HOME EXERCISE PROGRAM: Access Code: [email or web address] URL: [email or web address] Date: 09/15/2023 Prepared by: Kristeen Sar  Exercises - Sit to Stand with Counter Support  - 3 x daily - 3-4 x weekly - 1 sets - 1-5 reps - Hooklying Clamshell with Resistance  - 2 x daily - 3-4 x weekly - 1 sets - 10 reps - Seated Long Arc Quad  - 1 x daily - 7 x weekly - 1-3 sets - 10 reps - 5 sec hold - Standing Hip Abduction with Counter Support  - 1 x daily - 7 x weekly - 1-2 sets - 10 reps - Standing March with Counter Support  - 1 x daily - 7 x weekly - 1-2 sets - 10 reps - Standing Heel Raise with Support  - 1 x daily - 7 x weekly - 1-2 sets - 10 reps - Shoulder External Rotation and Scapular Retraction with Resistance  - 1 x daily - 7 x weekly - 2 sets - 10 reps - Sit to Stand with Armchair  - 1 x daily - 7 x weekly - 1 sets -  5 reps  Patient Education - Tips to reduce freezing episodes with standing or walking   ASSESSMENT:  CLINICAL IMPRESSION: Patient presents with increased anxiousness over his upcoming MD appointment. PT provided encouragement and education on ways to re frame his thinking when he gets nervous. Patient was a little more fatigue today and verbalized feeling exhausted at end of treatment session. Noted decreased toe clearance and shuffling steps with transfers and ambulation. Patient continues to benefit from skilled therapy to prevent decline and maintain independence.     OBJECTIVE IMPAIRMENTS: Abnormal gait, decreased balance, decreased ROM, decreased strength, decreased safety awareness, dizziness, impaired flexibility, impaired sensation, postural dysfunction, and pain.   ACTIVITY  LIMITATIONS: carrying, lifting, bending, standing, squatting, stairs, transfers, bed mobility, continence, bathing, and locomotion level  PARTICIPATION LIMITATIONS: shopping and community activity  PERSONAL FACTORS: Age, Fitness, Time since onset of injury/illness/exacerbation, and 3+ comorbidities: Compression fx L1 2022, CAD, CKD (stage 4), neuropathy, retinopathy are also affecting patient's functional outcome.   REHAB POTENTIAL: Good  CLINICAL DECISION MAKING: Unstable/unpredictable  EVALUATION COMPLEXITY: High   GOALS: Goals reviewed with patient? Yes  SHORT TERM GOALS: Target date: 07/30/23 Improved TUG to < 25 sec to decrease fall risk.  Baseline: Goal status: deferred  2.  Able to perform 5XSTS without UE assist and controlled descent showing improved LE strength.  Baseline:  Goal status: deferred 3.  Patient will be able to stand safely for 2 min without UE assist Baseline:  Goal status: met 5/27  LONG TERM GOALS: Target date: 06/16/2024  Ind with advanced HEP Baseline:  Goal status: IN PROGRESS   2.  Sit to stand from the recliner with PSFS score of 5 Baseline:  Goal status:  met 7/24  3.  Gait with RW from the bedroom to the sunroom 40-50 feet with PSFS of 3 Baseline:  Goal status: met 7/24  4. Improved LE mobility needed for greater ease with putting on socks and shoes in sitting with PSFS of 5 Baseline:  Goal status: met 7/24  5.  Patient will be able to stand long enough to brush teeth PSFS of 2 Baseline:  Goal status: met 5/27  6. Able to rise from 22 inch seat height push from the armrests to use the new chairs in the living room (in July) Goal status: met 7/24  7. Patient will be able to stand for 12 minutes needed for grooming and dressing tasks at home    Goal status: ongoing 04/06/2024                      8. The patient will have improved LE strength with 5x STS test with UE assist improved to 25 sec MET 8/27/225                       9.  The patient will be able to walk for 6 min with UE support with RW or // bars                           Ongoing 04/06/2024  PLAN:  PT FREQUENCY: 2x week  PT DURATION: 8 weeks  PLANNED INTERVENTIONS: 97164- PT Re-evaluation, 97110-Therapeutic exercises, 97530- Therapeutic activity, 97112- Neuromuscular re-education, 97535- Self Care, [id number]- Manual therapy, [id number]- Gait training, [phone]- Aquatic Therapy, 97014- Electrical stimulation (unattended), Patient/Family education, Balance training, Stair training, Taping, Joint mobilization, Spinal mobilization, DME instructions, Cryotherapy, and Moist heat  PLAN FOR NEXT  SESSION: consult next Friday 10/10 regarding upcoming surgery for shunt placement for NPH;  gait with RW in clinic for endurance;  Nu-Step; dual cable rows;  sit to stand from foam pad; static standing with teal power cord isometric trunk extension;  UE movements while standing for ADLs; gait in // bars working on standing erect, longer step lengths;  assess energy levels and endurance ; functional strengthening  Glade Pesa, PT 05/17/24 3:07 PM Phone: [phone] Fax: [phone]  PHYSICAL THERAPY DISCHARGE SUMMARY  Visits from Start of Care: 60  Current functional level related to goals / functional outcomes: Patient is being discharge due to having brain surgery.    Patient agrees to discharge. Patient goals were partially met. Patient is being discharged due to a change in medical status.

## 2024-05-19 ENCOUNTER — Ambulatory Visit: Admitting: Physical Therapy

## 2024-05-20 ENCOUNTER — Other Ambulatory Visit: Payer: Self-pay | Admitting: *Deleted

## 2024-05-20 ENCOUNTER — Ambulatory Visit: Admitting: Neurosurgery

## 2024-05-20 ENCOUNTER — Encounter: Payer: Self-pay | Admitting: Neurosurgery

## 2024-05-20 VITALS — BP 139/78 | HR 73 | Temp 98.1°F | Ht 67.0 in | Wt 160.2 lb

## 2024-05-20 DIAGNOSIS — G912 (Idiopathic) normal pressure hydrocephalus: Secondary | ICD-10-CM | POA: Diagnosis not present

## 2024-05-20 MED ORDER — INSULIN ASPART 100 UNIT/ML IJ SOLN: INTRAMUSCULAR | 1 refills | Status: AC

## 2024-05-20 NOTE — Patient Instructions (Signed)
 Please see below for information in regards to your upcoming surgery:   Planned surgery:  Right Ventriculoperitoneal Shunt   Surgery date:  06/06/2024 pending insurance (Will contact if insurance approval is past). at [employer] Entrance A -  [address] [place], [place] [address]- you will find out your arrival time the business day before.    Pre-op appointment at [facility] Pre-admit Testing: you will receive a call with a date/time for this appointment. If you are scheduled for an in person appointment, Pre-admit Testing is located at Entrance A. You will enter and check in at patient registration. During this appointment, they will advise you which medications you can take the morning of surgery, and which medications you will need to hold for surgery. Labs (such as blood work, EKG) may be done at your pre-op appointment. You are not required to fast for these labs. Should you need to change your pre-op appointment, please call Pre-admit testing at  [phone].  Blood Thinners Aspirin  81mg :  OK to stay on aspirin  81mg   Common restrictions after surgery: Please avoid lifting weight > 5lbs . Do not wash your hair until your staples/ stitches are removed.    How to contact us :  If you have any questions/concerns before or after surgery, you can reach us  at [phone], or you can send a mychart message. We can be reached by phone or mychart 8am-4pm, Monday-Friday.   Registered Nurse/Surgery scheduler:  Tinnie HERO RN-BSN Medical Assistants: Jesslyn DEL CMA, Avelina CMA.  Doctors: Dino Sable MD, Nancyann Burns MD

## 2024-05-20 NOTE — Progress Notes (Signed)
 Assessment : 68 year old gentleman with a history of chronic kidney disease, hypertension, insulin -dependent diabetes who lives with his twin brother.  In 2015 they moved here from [place] and about 3 to 4 years ago, he started having progressive gait imbalance.  This has progressively gotten worse and he is now almost wheelchair-bound.  In addition to that he has had progressive memory loss and it is short-term memory that has mainly been affected.  Lastly, he has become completely dependent upon diapers and is very incontinent.  Patient is very teary-eyed and saw Dr. Leigh for evaluation who recommended a lumbar puncture based upon his symptoms.  He had this done and demonstrated an opening pressure of 17 cm and 4 vials of CSF were taken off.  Patient was accompanied by his brother today.  Plan : I reviewed the imaging results with him and his brother and shared with him that he does have ventriculomegaly in the junction to cerebral atrophy.  I told him that the results of the lumbar puncture is very telling: He says that he was a completely [place] man: He felt that he could walk better, his cognition had improved and he did not only do the patient noticed this, his twin brother noticed this as well.  This lasted for a few days but was most pronounced for the first 24 hours after which things slowly decline.  I told him that I would recommend a ventriculoperitoneal shunt for this picture of normal pressure hydrocephalus.  I explained to them the risk of hemorrhage and infection.  We talked about the perioperative phase and that he would go home the next day.  We talked about the valve setting and that we would progressively dilate down on an as-needed basis depending on his clinical improvement.  I told him that he should expect about 80 to 90% improvement and that this would happen over several weeks.  I answered all their questions to the satisfaction and they would like to proceed with  the surgery.   Social History   Socioeconomic History   Marital status: Single    Spouse name: Not on file   Number of children: Not on file   Years of education: Not on file   Highest education level: Not on file  Occupational History   Not on file  Tobacco Use   Smoking status: Never   Smokeless tobacco: Never  Vaping Use   Vaping status: Never Used  Substance and Sexual Activity   Alcohol use: Not Currently    Comment: occ   Drug use: No   Sexual activity: Not Currently  Other Topics Concern   Not on file  Social History Narrative   Are you right handed or left handed? Right   Are you currently employed ?    What is your current occupation? retired   Do you live at home alone?   Who lives with you? Twin    What type of home do you live in: 1 story or 2 story? one   Caffeine none    Social Drivers of Corporate investment banker Strain: Not on file  Food Insecurity: No Food Insecurity (02/06/2020)   Hunger Vital Sign    Worried About Running Out of Food in the Last Year: Never true    Ran Out of Food in the Last Year: Never true  Transportation Needs: No Transportation Needs (02/06/2020)   PRAPARE - Administrator, Civil Service (Medical): No  Lack of Transportation (Non-Medical): No  Physical Activity: Not on file  Stress: Not on file  Social Connections: Not on file  Intimate Partner Violence: Not on file    Family History  Problem Relation Age of Onset   Alzheimer's disease Mother    Heart disease Father    Peripheral Artery Disease Father    Heart disease Brother    Colon polyps Neg Hx    Prostate cancer Neg Hx    Rectal cancer Neg Hx    Esophageal cancer Neg Hx     Allergies  Allergen Reactions   Codeine Nausea And Vomiting   Farxiga  [Dapagliflozin ]     Constipation   Gabapentin      Mood changes, depression    Losartan     Penicillins Rash    Did it involve swelling of the face/tongue/throat, SOB, or low BP? no Did it involve  sudden or severe rash/hives, skin peeling, or any reaction on the inside of your mouth or nose? yes Did you need to seek medical attention at a hospital or doctor's office? yes When did it last happen?    childhood   If all above answers are "NO", may proceed with cephalosporin use.     Past Medical History:  Diagnosis Date   Cardiac arrest ([name]) 05/22/2016   Cataract    CHF (congestive heart failure) ([name])    CKD (chronic kidney disease) stage 3, GFR 30-59 ml/min ([name]) 05/28/2021   CKD (chronic kidney disease) stage 4, GFR 15-29 ml/min ([name]) 05/28/2021   Diabetes mellitus without complication ([name])    type 2   Diabetic retinopathy ([name])    Hyperlipidemia    Hypertension    Memory loss    mild   Myocardial infarct ([name]) 2105   Retinopathy    Both eyes   S/P primary angioplasty with coronary stent 05/22/2016   Sleep-disordered breathing 12/31/2023   Vitamin B12 deficiency    Vitreous hemorrhage of left eye ([name])    proliferative diabetic retinopathy    Past Surgical History:  Procedure Laterality Date   ANGIOPLASTY     2015   COLONOSCOPY     multiple   EYE SURGERY     PARS PLANA VITRECTOMY Left 03/15/2020   Procedure: PARS PLANA VITRECTOMY WITH 25 GAUGE;  Surgeon: Jarold Mayo, MD;  Location: [facility] OR;  Service: Ophthalmology;  Laterality: Left;   PHOTOCOAGULATION WITH LASER Left 03/15/2020   Procedure: PHOTOCOAGULATION WITH LASER; INTRAVITREAL INJECTION OF AVASTIN ;  Surgeon: Jarold Mayo, MD;  Location: [facility] OR;  Service: Ophthalmology;  Laterality: Left;   REFRACTIVE SURGERY     UPPER GI ENDOSCOPY     several   VITRECTOMY       Physical Exam HENT:     Head: Normocephalic.     Nose: Nose normal.  Eyes:     Pupils: Pupils are equal, round, and reactive to light.  Cardiovascular:     Rate and Rhythm: Normal rate.  Pulmonary:     Effort: Pulmonary effort is normal.  Abdominal:     General: Abdomen is flat.  Musculoskeletal:     Cervical back: Normal range of  motion.  Neurological:     Mental Status: He is alert.     Cranial Nerves: Cranial nerves 2-12 are intact.     Sensory: Sensation is intact.     Motor: Motor function is intact.     Coordination: Coordination abnormal.     Comments: Not ambulated        Results  for orders placed or performed during the hospital encounter of 01/15/24  MR Brain Wo Contrast   Narrative   CLINICAL DATA:  Hydrocephalus.  Difficulty walking and weakness.  EXAM: MRI HEAD WITHOUT CONTRAST  TECHNIQUE: Multiplanar, multiecho pulse sequences of the brain and surrounding structures were obtained without intravenous contrast.  COMPARISON:  CT head without contrast 12/29/2023 and 05/10/2020.  FINDINGS: Brain: Chronic ventricular enlargement is stable. The callosal angle measures 59 degrees.  Mild periventricular T2 hyperintensities are present bilaterally. No acute infarct or hemorrhage is present. Mild white matter changes extend into the brainstem.  Deep brain nuclei are within normal limits. No significant extraaxial fluid collection is present.  The internal auditory canals are within normal limits. Midline structures are within normal limits.  Vascular: Flow is present in the major intracranial arteries.  Skull and upper cervical spine: The craniocervical junction is normal. Upper cervical spine is within normal limits. Marrow signal is unremarkable.  Sinuses/Orbits: The paranasal sinuses and mastoid air cells are clear. The globes and orbits are within normal limits.  IMPRESSION: 1. Stable chronic ventricular enlargement. The callosal angle measures 59 degrees. This is consistent with normal pressure hydrocephalus. 2. Mild periventricular T2 hyperintensities bilaterally extend into the brainstem. This likely reflects the sequela of chronic microvascular ischemia. 3. No acute intracranial abnormality or significant interval change.   Electronically Signed   By: Lonni Necessary  M.D.   On: 01/28/2024 20:30   Results for orders placed or performed during the hospital encounter of 12/29/23  CT HEAD WO CONTRAST   Narrative   CLINICAL DATA:  New onset headache  EXAM: CT HEAD WITHOUT CONTRAST  TECHNIQUE: Contiguous axial images were obtained from the base of the skull through the vertex without intravenous contrast.  RADIATION DOSE REDUCTION: This exam was performed according to the departmental dose-optimization program which includes automated exposure control, adjustment of the mA and/or kV according to patient size and/or use of iterative reconstruction technique.  COMPARISON:  05/10/2020  FINDINGS: Brain: Ventriculomegaly with callosum angle narrowing and disproportionate subarachnoid spaces. Mild low-density in the cerebral white matter, usually chronic small vessel ischemia. No acute infarct, hemorrhage, obstructive hydrocephalus, mass, or collection.  Vascular: No hyperdense vessel or unexpected calcification.  Skull: Normal. Negative for fracture or focal lesion.  Sinuses/Orbits: No acute finding.  IMPRESSION: Chronic ventriculomegaly with features seen in normal pressure hydrocephalus, correlate for associated symptomatology.  Mild chronic white matter disease.  No acute finding.   Electronically Signed   By: Dorn Roulette M.D.   On: 12/29/2023 10:07    *Note: Due to a large number of results and/or encounters for the requested time period, some results have not been displayed. A complete set of results can be found in Results Review.

## 2024-05-23 ENCOUNTER — Ambulatory Visit (INDEPENDENT_AMBULATORY_CARE_PROVIDER_SITE_OTHER): Admitting: Family Medicine

## 2024-05-23 ENCOUNTER — Encounter: Payer: Self-pay | Admitting: Family Medicine

## 2024-05-23 VITALS — BP 140/82 | HR 78 | Temp 97.6°F | Ht 67.0 in | Wt 168.5 lb

## 2024-05-23 DIAGNOSIS — E1142 Type 2 diabetes mellitus with diabetic polyneuropathy: Secondary | ICD-10-CM

## 2024-05-23 DIAGNOSIS — E113513 Type 2 diabetes mellitus with proliferative diabetic retinopathy with macular edema, bilateral: Secondary | ICD-10-CM | POA: Diagnosis not present

## 2024-05-23 DIAGNOSIS — I152 Hypertension secondary to endocrine disorders: Secondary | ICD-10-CM | POA: Diagnosis not present

## 2024-05-23 DIAGNOSIS — G912 (Idiopathic) normal pressure hydrocephalus: Secondary | ICD-10-CM | POA: Diagnosis not present

## 2024-05-23 DIAGNOSIS — Z794 Long term (current) use of insulin: Secondary | ICD-10-CM

## 2024-05-23 DIAGNOSIS — E1159 Type 2 diabetes mellitus with other circulatory complications: Secondary | ICD-10-CM

## 2024-05-23 LAB — POCT GLYCOSYLATED HEMOGLOBIN (HGB A1C): Hemoglobin A1C: 7.2 % — AB (ref 4.0–5.6)

## 2024-05-23 NOTE — Assessment & Plan Note (Signed)
 A1c 7.2.  Congratulated patient on A1c improvement.  He is currently on Lantus  44 units daily.  Uses NovoLog  as needed for high sugars though typically does not need to use this.  He is doing a great job with style interventions.  Recheck A1c in 3 months.

## 2024-05-23 NOTE — Assessment & Plan Note (Signed)
 Very mildly elevated today though typically well-controlled on Coreg  3.125 mg twice daily.  He will monitor at home and let us  know if persistently elevated.

## 2024-05-23 NOTE — Progress Notes (Signed)
   Jeffrey [name] is a 68 y.o. male who presents today for an office visit.  Assessment/Plan:  Chronic Problems Addressed Today: [name] A1c 7.2.  Congratulated patient on A1c improvement.  He is currently on Lantus  44 units daily.  Uses NovoLog  as needed for high sugars though typically does not need to use this.  He is doing a great job with style interventions.  Recheck A1c in 3 months.  Hypertension associated with diabetes ([name]) Very mildly elevated today though typically well-controlled on Coreg  3.125 mg twice daily.  He will monitor at home and let us  know if persistently elevated.  NPH (normal pressure hydrocephalus) ([name]) Continue management per surgery and neurosurgery.  Planning on having VP shunt placed soon.     Subjective:  HPI:  See assessment / plan for status of chronic conditions.  Patient is here today for follow-up.  Last saw him a few months ago.  Since our last visit he has been working on diet and exercise.  His brother's been helping him with managing his Leukos at home.  His average sugars been in the 150s.  Since our last visit he also underwent lumbar puncture as a diagnostic evaluation for possible NPH.  He felt subjective improvement afterwards however since then he has returned to his baseline.  He is planning on having a VP shunt placed in a few weeks.         Objective:  Physical Exam: BP (!) 140/82   Pulse 78   Temp 97.6 F (36.4 C) (Temporal)   Ht 5' 7 (1.702 m)   Wt 168 lb 8 oz (76.4 kg)   SpO2 98%   BMI 26.39 kg/m   Gen: No acute distress, resting comfortably CV: Regular rate and rhythm with no murmurs appreciated Pulm: Normal work of breathing, clear to auscultation bilaterally with no crackles, wheezes, or rhonchi Neuro: Grossly normal, moves all extremities Psych: Normal affect and thought content      Jeffrey [name] M. Kennyth, MD 05/23/2024 9:39 AM

## 2024-05-23 NOTE — Patient Instructions (Signed)
 It was very nice to see you today!  VISIT SUMMARY: During your visit, we discussed your blood sugar control, lumbar puncture results, and other health concerns. Your diabetes management is improving, and we have scheduled a shunt placement for your hydrocephalus. We also addressed your seborrheic keratosis and planned for its removal.  YOUR PLAN: HYDROCEPHALUS: You have memory and mobility issues due to hydrocephalus. A lumbar puncture provided temporary relief. -Shunt placement is scheduled for June 06, 2024. -You will stay in the hospital for one night post-surgery, with the possibility of a longer stay if complications arise. -Avoid lifting more than five pounds post-operatively. -Schedule a follow-up with the neurosurgeon post-surgery for potential shunt adjustment.  TYPE 2 DIABETES MELLITUS WITH DIABETIC POLYNEUROPATHY: Your diabetes management is improving, with a decrease in hemoglobin A1c to 7.2%. -Continue the current insulin  regimen with 44 units of long-acting insulin . -Monitor blood glucose levels regularly, including nighttime checks. -Maintain dietary modifications to support weight loss and glycemic control. -Follow up in three months for diabetes management.  Return in about 3 months (around 08/23/2024).   Take care, Dr Kennyth  PLEASE NOTE:  If you had any lab tests, please let us  know if you have not heard back within a few days. You may see your results on mychart before we have a chance to review them but we will give you a call once they are reviewed by us .   If we ordered any referrals today, please let us  know if you have not heard from their office within the next week.   If you had any urgent prescriptions sent in today, please check with the pharmacy within an hour of our visit to make sure the prescription was transmitted appropriately.   Please try these tips to maintain a healthy lifestyle:  Eat at least 3 REAL meals and 1-2 snacks per day.  Aim for no  more than 5 hours between eating.  If you eat breakfast, please do so within one hour of getting up.   Each meal should contain half fruits/vegetables, one quarter protein, and one quarter carbs (no bigger than a computer mouse)  Cut down on sweet beverages. This includes juice, soda, and sweet tea.   Drink at least 1 glass of water with each meal and aim for at least 8 glasses per day  Exercise at least 150 minutes every week.

## 2024-05-23 NOTE — Assessment & Plan Note (Signed)
 Continue management per surgery and neurosurgery.  Planning on having VP shunt placed soon.

## 2024-05-24 ENCOUNTER — Ambulatory Visit: Admitting: Physical Therapy

## 2024-05-24 ENCOUNTER — Other Ambulatory Visit: Payer: Self-pay

## 2024-05-24 DIAGNOSIS — G912 (Idiopathic) normal pressure hydrocephalus: Secondary | ICD-10-CM

## 2024-05-24 DIAGNOSIS — Z049 Encounter for examination and observation for unspecified reason: Secondary | ICD-10-CM

## 2024-05-26 ENCOUNTER — Encounter: Admitting: Physical Therapy

## 2024-05-27 ENCOUNTER — Other Ambulatory Visit: Payer: Self-pay | Admitting: Family Medicine

## 2024-05-30 ENCOUNTER — Telehealth: Payer: Self-pay

## 2024-05-30 NOTE — Telephone Encounter (Signed)
 Patient's brother asked if the patient will be able to stay two nights at the hospital after his surgery. They are concerned about [name]'s diabetes, CKD and other chronic health conditions and feel most comfortable staying 2 nights.   I told Jeffrey [name], the patient's brother that I would relay this to Dr. Janjua but this may be determined by how he is doing after surgery.   He would like me to call him after Dr. Janjua gives his input to the situation.

## 2024-05-31 ENCOUNTER — Encounter: Admitting: Physical Therapy

## 2024-05-31 ENCOUNTER — Encounter (HOSPITAL_COMMUNITY): Payer: Self-pay

## 2024-05-31 ENCOUNTER — Telehealth (HOSPITAL_BASED_OUTPATIENT_CLINIC_OR_DEPARTMENT_OTHER): Payer: Self-pay | Admitting: *Deleted

## 2024-05-31 NOTE — Pre-Procedure Instructions (Signed)
 Surgical Instructions   Your procedure is scheduled on June 06, 2024. Report to [facility] Main Entrance A at 5:30 A.M., then check in with the Admitting office. Any questions or running late day of surgery: call [phone]  Questions prior to your surgery date: call [phone], Monday-Friday, 8am-4pm. If you experience any cold or flu symptoms such as cough, fever, chills, shortness of breath, etc. between now and your scheduled surgery, please notify us  at the above number.     Remember:  Do not eat after midnight the night before your surgery   You may drink clear liquids until 4:30 AM the morning of your surgery.   Clear liquids allowed are: Water, Non-Citrus Juices (without pulp), Carbonated Beverages, Clear Tea (no milk, honey, etc.), Black Coffee Only (NO MILK, CREAM OR POWDERED CREAMER of any kind), and Gatorade.    Take these medicines the morning of surgery with A SIP OF WATER: aspirin  EC  atorvastatin  (LIPITOR )  carvedilol  (COREG )  pantoprazole  (PROTONIX )    May take these medicines IF NEEDED: acetaminophen  (TYLENOL )  carboxymethylcellulose (REFRESH TEARS) eye drops   One week prior to surgery, STOP taking any Aleve, Naproxen, Ibuprofen, Motrin, Advil, Goody's, BC's, all herbal medications, fish oil, and non-prescription vitamins.   WHAT DO I DO ABOUT MY DIABETES MEDICATION?   THE NIGHT BEFORE SURGERY, take 22 units of insulin  glargine (LANTUS  SOLOSTAR).      DO NOT take any bedtime doses of your insulin  aspart (NOVOLOG ).  Morning of surgery, if your CBG is greater than 220 mg/dL, you may take  of your sliding scale insulin  aspart (NOVOLOG ).   HOW TO MANAGE YOUR DIABETES BEFORE AND AFTER SURGERY  Why is it important to control my blood sugar before and after surgery? Improving blood sugar levels before and after surgery helps healing and can limit problems. A way of improving blood sugar control is eating a healthy diet by:  Eating less sugar and  carbohydrates  Increasing activity/exercise  Talking with your doctor about reaching your blood sugar goals High blood sugars (greater than 180 mg/dL) can raise your risk of infections and slow your recovery, so you will need to focus on controlling your diabetes during the weeks before surgery. Make sure that the doctor who takes care of your diabetes knows about your planned surgery including the date and location.  How do I manage my blood sugar before surgery? Check your blood sugar at least 4 times a day, starting 2 days before surgery, to make sure that the level is not too high or low.  Check your blood sugar the morning of your surgery when you wake up and every 2 hours until you get to the Short Stay unit.  If your blood sugar is less than 70 mg/dL, you will need to treat for low blood sugar: Do not take insulin . Treat a low blood sugar (less than 70 mg/dL) with  cup of clear juice (cranberry or apple), 4 glucose tablets, OR glucose gel. Recheck blood sugar in 15 minutes after treatment (to make sure it is greater than 70 mg/dL). If your blood sugar is not greater than 70 mg/dL on recheck, call [phone] for further instructions. Report your blood sugar to the short stay nurse when you get to Short Stay.  If you are admitted to the hospital after surgery: Your blood sugar will be checked by the staff and you will probably be given insulin  after surgery (instead of oral diabetes medicines) to make sure you have good blood  sugar levels. The goal for blood sugar control after surgery is 80-180 mg/dL.             Do NOT Smoke (Tobacco/Vaping) for 24 hours prior to your procedure.  If you use a CPAP at night, you may bring your mask/headgear for your overnight stay.   You will be asked to remove any contacts, glasses, piercing's, hearing aid's, dentures/partials prior to surgery. Please bring cases for these items if needed.    Patients discharged the day of surgery will not be  allowed to drive home, and someone needs to stay with them for 24 hours.  SURGICAL WAITING ROOM VISITATION Patients may have no more than 2 support people in the waiting area - these visitors may rotate.   Pre-op nurse will coordinate an appropriate time for 1 ADULT support person, who may not rotate, to accompany patient in pre-op.  Children under the age of 68 must have an adult with them who is not the patient and must remain in the main waiting area with an adult.  If the patient needs to stay at the hospital during part of their recovery, the visitor guidelines for inpatient rooms apply.  Please refer to the [facility] website for the visitor guidelines for any additional information.   If you received a COVID test during your pre-op visit  it is requested that you wear a mask when out in public, stay away from anyone that may not be feeling well and notify your surgeon if you develop symptoms. If you have been in contact with anyone that has tested positive in the last 10 days please notify you surgeon.      Pre-operative CHG Bathing Instructions   You can play a key role in reducing the risk of infection after surgery. Your skin needs to be as free of germs as possible. You can reduce the number of germs on your skin by washing with CHG (chlorhexidine  gluconate) soap before surgery. CHG is an antiseptic soap that kills germs and continues to kill germs even after washing.   DO NOT use if you have an allergy to chlorhexidine /CHG or antibacterial soaps. If your skin becomes reddened or irritated, stop using the CHG and notify one of our RNs at [phone].              TAKE A SHOWER THE NIGHT BEFORE SURGERY   Please keep in mind the following:  DO NOT shave, including legs and underarms, 48 hours prior to surgery.   You may shave your face before/day of surgery.  Place clean sheets on your bed the night before surgery Use a clean washcloth (not used since being washed) for  shower. DO NOT sleep with pet's night before surgery.  CHG Shower Instructions:  Wash your face and private area with normal soap. If you choose to wash your hair, wash first with your normal shampoo.  After you use shampoo/soap, rinse your hair and body thoroughly to remove shampoo/soap residue.  Turn the water OFF and apply half the bottle of CHG soap to a CLEAN washcloth.  Apply CHG soap ONLY FROM YOUR NECK DOWN TO YOUR TOES (washing for 3-5 minutes)  DO NOT use CHG soap on face, private areas, open wounds, or sores.  Pay special attention to the area where your surgery is being performed.  If you are having back surgery, having someone wash your back for you may be helpful. Wait 2 minutes after CHG soap is applied, then you may  rinse off the CHG soap.  Pat dry with a clean towel  Put on clean pajamas    Additional instructions for the day of surgery: If you choose, you may shower the morning of surgery with an antibacterial soap.  DO NOT APPLY any lotions, deodorants, cologne, or perfumes.   Do not wear jewelry or makeup Do not wear nail polish, gel polish, artificial nails, or any other type of covering on natural nails (fingers and toes) Do not bring valuables to the hospital. [facility] is not responsible for valuables/personal belongings. Put on clean/comfortable clothes.  Please brush your teeth.  Ask your nurse before applying any prescription medications to the skin.

## 2024-05-31 NOTE — Telephone Encounter (Signed)
   Pre-operative Risk Assessment    Patient Name: Jeffrey [name]  DOB: 03-15-1956 MRN: [id number]   Date of last office visit: 02/17/23 DR. [place] Date of next office visit: NONE   Request for Surgical Clearance    Procedure:  INSERTION OF VENTRICULOPERITONEAL SHUNT  Date of Surgery:  Clearance 06/06/24                                Surgeon:  DR. DINO SABLE Surgeon's Group or Practice Name:  [facility] Phone number:  [phone] Fax number:  [phone]   Type of Clearance Requested:   - Medical ; PER FORM PT CAN REMAIN ON ASA   Type of Anesthesia:  General    Additional requests/questions:    Bonney Niels Jest   05/31/2024, 2:38 PM

## 2024-05-31 NOTE — Telephone Encounter (Signed)
 Pt has appt with Aline Door, [facility] 06/01/24 @ 3:10 at [facility].

## 2024-05-31 NOTE — Progress Notes (Unsigned)
 Cardiology Office Note:    Date:  06/01/2024   ID:  Jeffrey [name], DOB [birth year], MRN [id number]  PCP:  Kennyth Worth HERO, MD  Cardiologist:  Annabella Scarce, MD     Referring MD: Kennyth Worth HERO, MD   Chief Complaint: pre-op evaluation  History of Present Illness:    Jeffrey [name] is a 68 y.o. male with a history of CAD with  MI and cardiac arrest in 11/2013 s/p PCI to LAD, chronic HFpEF, mild aortic insufficiency, hypertension, hyperlipidemia, type 2 diabetes mellitus with peripheral neuropathy, CKD stage IV,  and vitreous hemorrhage in 2016 s/p multiple laser procedures who is followed by Dr. Scarce and presents today for pre-op evaluation for ventricular-peritoneal shunt insertion.  Patient was first seen by Dr. Scarce in 05/2016 to establish care after moving to the area from [place]. He had a MI and cardiac arrest in 11/2013 and underwent PCI of LAD of that time. Cardiac PET stress trest in 10/2015 showed a a moderate sized, moderate intensity antero-apical infarct with mild ischemia. Myoview  in 09/2017 showed a prior apical infarct but no evidence of ischemia. Echo in 09/2017 showed LVEF of 45-50% with mild hypokinesis of the apical myocardium and grade 1 diastolic dysfunction.  He has a history of syncope in the past requiring a decrease in his antihypertensive medications. He was seen in the ED in 05/2019 after a syncopal episode that occurred in the setting of intravascular volume depletion. Echo in 07/2019 showed LVEF of 50-55% with mild increase in the septal wall thickness, normal RV function, mild AI, and mild dilatation of the aortic root measuring 39 mm.   He was last seen by Dr. Scarce in 02/2023 at which time he reported not feeling great. His Nephrologist had recently stopped his Spironolactone  due to progression of his CKD to stage IV but he felt like his whole body changed with this and had no injury so he restarted it on his own. He was overall doing well from a cardiac  standpoint. He was continued on Spironolactone  per his preference and Lasix  was decreased to PRN use instead.   Since last visit, he was diagnosed with normal pressure hydrocephalus and Neurosurgery has recommended ventricular-peritoneal shunt insertion. He presents today for pre-op evaluation.  He is here with his brother.  He is doing well from a cardiac standpoint and  has no cardiac complaints today.  He is very eager to have his surgery done as he has been struggling with the symptoms (mainly balance issues and incontinence) for several years now.  He states his balance improved directly after having the lumbar puncture in 04/2024 and he was able to walk much quicker. However, symptoms quickly returned due to buildup of pressure which is why he is now having a shunt placed. Neurosurgery is very optimistic that this will resolved his problems.  His activity is a limited due to his balance issues.  However, he is able to complete right around 4 METS of physical activity. He is able to do his activities of daily living, walk inside and walk around 1 block on level ground with his walker),  walk up a slight hill, and do light housework without any issues.  He states his functional status/ activity level  is unchanged form when he had his last Myoview  in 2019 which showed no evidence of ischemia.   Of note, he states his Nephrologist increased his Lasix  back to 40mg  daily and decreased his Spironolactone  to 12.5mg  daily since last visit and  this seems ot be working well.   ROS: No chest pain, shortness of breath, orthopnea, PND, lower extremity edema, palpitations, lightheadedness, dizziness, syncope.     EKGs/Labs/Other Studies Reviewed:    The following studies were reviewed:  Myoview  10/02/2017: The left ventricular ejection fraction is mildly decreased (45-54%). Nuclear stress EF: 48%. There was no ST segment deviation noted during stress. Defect 1: There is a small defect of severe severity  present in the apical inferior, apical lateral and apex location. This is a low risk study.   Abnormal, low risk stress nuclear study with prior apical infarct; no ischemia; EF 48 with mild global hypokinesis; mild LVE. _______________  Echocardiogram 07/22/2019: Impressions: 1. Left ventricular ejection fraction, by visual estimation, is 50 to  55%. The left ventricle has low normal function. Left ventricular septal  wall thickness was mildly increased. There is no left ventricular  hypertrophy.   2. The left ventricle demonstrates regional wall motion abnormalities.   3. Normal GLS -18 Inferior basal and distal septal hypokinesis.   4. Global right ventricle has normal systolic function.The right  ventricular size is normal. No increase in right ventricular wall  thickness.   5. Left atrial size was mildly dilated.   6. Right atrial size was normal.   7. The mitral valve is normal in structure. Trivial mitral valve  regurgitation. No evidence of mitral stenosis.   8. The tricuspid valve is normal in structure. Tricuspid valve  regurgitation is not demonstrated.   9. The aortic valve is tricuspid. Aortic valve regurgitation is mild.  Mild to moderate aortic valve sclerosis/calcification without any evidence  of aortic stenosis.  10. The pulmonic valve was normal in structure. Pulmonic valve  regurgitation is not visualized.  11. Aortic dilatation noted.  12. There is mild dilatation of the aortic root measuring 39 mm.  13. The inferior vena cava is normal in size with greater than 50%  respiratory variability, suggesting right atrial pressure of 3 mmHg.    EKG:  EKG ordered today.   EKG Interpretation Date/Time:  Wednesday June 01 2024 15:41:27 EDT Ventricular Rate:  90 PR Interval:  174 QRS Duration:  76 QT Interval:  360 QTC Calculation: 440 R Axis:   -77  Text Interpretation: Normal sinus rhythm Left axis deviation Low voltage QRS Inferior infarct , age  undetermined Anterolateral infarct , age undetermined  No significant changes since last tracing Confirmed by Jadine Patient [phone]) on 06/01/2024 3:53:59 PM    Recent Labs: 09/03/2023: Magnesium  2.2 11/26/2023: TSH 4.10 12/31/2023: ALT 33 06/01/2024: BUN 43; Creatinine, Ser 2.13; Hemoglobin 16.0; Platelets 426; Potassium 4.5; Sodium 134  Recent Lipid Panel    Component Value Date/Time   CHOL 103 11/26/2023 0811   CHOL 116 01/06/2019 0819   TRIG 92.0 11/26/2023 0811   HDL 31.60 (L) 11/26/2023 0811   HDL 30 (L) 01/06/2019 0819   CHOLHDL 3 11/26/2023 0811   VLDL 18.4 11/26/2023 0811   LDLCALC 53 11/26/2023 0811   LDLCALC 58 01/06/2019 0819    Physical Exam:    Vital Signs: BP 115/74   Pulse 88   Ht 5' 8 (1.727 m)   Wt 157 lb (71.2 kg)   SpO2 94%   BMI 23.87 kg/m     Wt Readings from Last 3 Encounters:  06/01/24 157 lb (71.2 kg)  06/01/24 168 lb 6.9 oz (76.4 kg)  05/23/24 168 lb 8 oz (76.4 kg)     General: 68 y.o. male in no acute distress.  HEENT: Normocephalic and atraumatic. Sclera clear.  Neck: Supple. No carotid bruits. No JVD. Heart: RRR. Distinct S1 and S2. No murmurs, gallops, or rubs.  Lungs: No increased work of breathing. Clear to ausculation bilaterally. No wheezes, rhonchi, or rales.  Extremities: No lower extremity edema.  Skin: Warm and dry. Neuro: No focal deficits. Psych: Normal affect. Responds appropriately.   Assessment:    1. Preop cardiovascular exam   2. Coronary artery disease involving native coronary artery of native heart without angina pectoris   3. Chronic heart failure with preserved ejection fraction (HFpEF) ([name])   4. Aortic valve insufficiency, etiology of cardiac valve disease unspecified   5. Primary hypertension   6. Hyperlipidemia, unspecified hyperlipidemia type   7. Type 2 diabetes mellitus with complication, with long-term current use of insulin  ([name])   8. Chronic kidney disease (CKD), stage IV (severe) ([name])     Plan:     Pre-Op Evaluation Patient has upcoming ventricular-peritoneal shunt insertion planned for treatment of normal pressure hydrocephalus. Revised Cardiac Risk Index = 4 (CAD with prior MI, CHF, CKD with creatinine >1.5, and insulin  dependent DM) indicating he has a 10% risk of a major cardiac event perioperatively.  However, he is well optimized from a cardiac standpoint. His activity is limited by his balance issues from the normal pressure hydrocephalus, but he is able to complete right around 4.0 METS of physical activity without anginal symptoms.  He states his activity level and functional status is unchanged from when he had the Myoview  in 2019 which was low risk. He understands that he is at a higher risk of adverse cardiac events given his medical history/ comorbidities and is willing and eager to proceed with surgery. Based on ACC/AHA guidelines, patient would be at acceptable risk for the planned procedure without further cardiovascular testing.   CAD History of MI and cardiac arrest in 2015 in [place] s/p PCI to LAD at that time. He has had multiple stress tests since that time. Last Myoview  in 2019 showed a prior apical infarct but no evidence of ischemia.  - No chest pain.  - Continue Aspirin  81mg  daily and Lipitor  80mg  daily.   Chronic HFpEF Last Echo in 07/2019 showed LVEF of 50-55% with mild increase in the septal wall thickness, normal RV function, mild AI, and mild dilatation of the aortic root measuring 39 mm.  - Euvolemic on exam.  - Continue Lasix  40mg  daily.  - Continue Spironolactone  12.5mg  daily.    Mild Aortic Insufficiency Noted on Echo in 05/2019.  - Will repeat Echo for routine screening. This does not have to be done prior to upcoming surgery.   Hypertension BP well controlled.  - Continue current medications: Coreg  3.125mg  twice daily and Spironolactone  12.5mg  daily.   Hyperlipidemia Lipid panel in 11/2023: Total Cholesterol 103, Triglycerides 92, HDL 31.6, LDL 53.  -  Continue Lipitor  80mg  daily.   Type 2 Diabetes Mellitus Hemoglobin A1 7.2% on 05/23/2024.  - On Insulin .  - Management per PCP.  CKD Stage IV Baseline creatinine around 2.2 to 2.6. - Followed by Nephrology.  Disposition: Follow up in 6 months.    Signed, Aline FORBES Door, PA-C  06/01/2024 9:59 PM    [facility]

## 2024-05-31 NOTE — Telephone Encounter (Signed)
 Left voicemail for patients brother to discuss Dr. Catarino response and discuss Anesthesiology request for cardiac clearance.   I will also explain that I am waiting on an Ok from Dr. Rosslyn regarding home health referral.

## 2024-05-31 NOTE — Telephone Encounter (Signed)
 Patient is seeing cards on 05/31/2024. I discussed clearance concerns with Patient's Brother.

## 2024-05-31 NOTE — Telephone Encounter (Signed)
   Name: Jeffrey [name]  DOB: Feb 18, 1956  MRN: [id number]  Primary Cardiologist: Annabella Scarce, MD  Chart reviewed as part of pre-operative protocol coverage. Because of Governor [name]'s past medical history and time since last visit, he will require a follow-up in-office visit in order to better assess preoperative cardiovascular risk.  Pre-op covering staff: - Please schedule appointment and call patient to inform them. If patient already had an upcoming appointment within acceptable timeframe, please add pre-op clearance to the appointment notes so provider is aware. - Please contact requesting surgeon's office via preferred method (i.e, phone, fax) to inform them of need for appointment prior to surgery.   [name] [name], NP  05/31/2024, 4:28 PM

## 2024-06-01 ENCOUNTER — Encounter (HOSPITAL_COMMUNITY): Payer: Self-pay

## 2024-06-01 ENCOUNTER — Ambulatory Visit: Attending: Student | Admitting: Student

## 2024-06-01 ENCOUNTER — Encounter: Payer: Self-pay | Admitting: Student

## 2024-06-01 ENCOUNTER — Other Ambulatory Visit: Payer: Self-pay

## 2024-06-01 ENCOUNTER — Encounter (HOSPITAL_COMMUNITY)
Admission: RE | Admit: 2024-06-01 | Discharge: 2024-06-01 | Disposition: A | Discharge status: Home / Self Care | Source: Ambulatory Visit | Attending: Neurosurgery | Admitting: Neurosurgery

## 2024-06-01 VITALS — BP 115/74 | HR 88 | Ht 68.0 in | Wt 157.0 lb

## 2024-06-01 VITALS — BP 138/72 | HR 76 | Temp 98.0°F | Ht 67.0 in | Wt 168.4 lb

## 2024-06-01 DIAGNOSIS — I509 Heart failure, unspecified: Secondary | ICD-10-CM | POA: Diagnosis not present

## 2024-06-01 DIAGNOSIS — E1122 Type 2 diabetes mellitus with diabetic chronic kidney disease: Secondary | ICD-10-CM | POA: Diagnosis not present

## 2024-06-01 DIAGNOSIS — I10 Essential (primary) hypertension: Secondary | ICD-10-CM

## 2024-06-01 DIAGNOSIS — Z01812 Encounter for preprocedural laboratory examination: Secondary | ICD-10-CM | POA: Diagnosis not present

## 2024-06-01 DIAGNOSIS — I5032 Chronic diastolic (congestive) heart failure: Secondary | ICD-10-CM | POA: Diagnosis not present

## 2024-06-01 DIAGNOSIS — E118 Type 2 diabetes mellitus with unspecified complications: Secondary | ICD-10-CM

## 2024-06-01 DIAGNOSIS — Z01818 Encounter for other preprocedural examination: Secondary | ICD-10-CM

## 2024-06-01 DIAGNOSIS — I130 Hypertensive heart and chronic kidney disease with heart failure and stage 1 through stage 4 chronic kidney disease, or unspecified chronic kidney disease: Secondary | ICD-10-CM | POA: Insufficient documentation

## 2024-06-01 DIAGNOSIS — E11319 Type 2 diabetes mellitus with unspecified diabetic retinopathy without macular edema: Secondary | ICD-10-CM | POA: Insufficient documentation

## 2024-06-01 DIAGNOSIS — I252 Old myocardial infarction: Secondary | ICD-10-CM | POA: Diagnosis not present

## 2024-06-01 DIAGNOSIS — N184 Chronic kidney disease, stage 4 (severe): Secondary | ICD-10-CM | POA: Insufficient documentation

## 2024-06-01 DIAGNOSIS — G912 (Idiopathic) normal pressure hydrocephalus: Secondary | ICD-10-CM | POA: Diagnosis not present

## 2024-06-01 DIAGNOSIS — Z01810 Encounter for preprocedural cardiovascular examination: Secondary | ICD-10-CM

## 2024-06-01 DIAGNOSIS — E785 Hyperlipidemia, unspecified: Secondary | ICD-10-CM | POA: Insufficient documentation

## 2024-06-01 DIAGNOSIS — I351 Nonrheumatic aortic (valve) insufficiency: Secondary | ICD-10-CM | POA: Diagnosis not present

## 2024-06-01 DIAGNOSIS — E1142 Type 2 diabetes mellitus with diabetic polyneuropathy: Secondary | ICD-10-CM | POA: Diagnosis not present

## 2024-06-01 DIAGNOSIS — Z794 Long term (current) use of insulin: Secondary | ICD-10-CM | POA: Insufficient documentation

## 2024-06-01 DIAGNOSIS — Z955 Presence of coronary angioplasty implant and graft: Secondary | ICD-10-CM | POA: Diagnosis not present

## 2024-06-01 DIAGNOSIS — I2510 Atherosclerotic heart disease of native coronary artery without angina pectoris: Secondary | ICD-10-CM | POA: Diagnosis not present

## 2024-06-01 HISTORY — DX: Atherosclerotic heart disease of native coronary artery without angina pectoris: I25.10

## 2024-06-01 LAB — BASIC METABOLIC PANEL WITH GFR
Anion gap: 8 (ref 5–15)
BUN: 43 mg/dL — ABNORMAL HIGH (ref 8–23)
CO2: 25 mmol/L (ref 22–32)
Calcium: 8.9 mg/dL (ref 8.9–10.3)
Chloride: 101 mmol/L (ref 98–111)
Creatinine, Ser: 2.13 mg/dL — ABNORMAL HIGH (ref 0.61–1.24)
GFR, Estimated: 33 mL/min — ABNORMAL LOW (ref >60)
Glucose, Bld: 120 mg/dL — ABNORMAL HIGH (ref 70–99)
Potassium: 4.5 mmol/L (ref 3.5–5.1)
Sodium: 134 mmol/L — ABNORMAL LOW (ref 135–145)

## 2024-06-01 LAB — CBC
HCT: 47.6 % (ref 39.0–52.0)
Hemoglobin: 16 g/dL (ref 13.0–17.0)
MCH: 31.9 pg (ref 26.0–34.0)
MCHC: 33.6 g/dL (ref 30.0–36.0)
MCV: 94.8 fL (ref 80.0–100.0)
Platelets: 426 K/uL — ABNORMAL HIGH (ref 150–400)
RBC: 5.02 MIL/uL (ref 4.22–5.81)
RDW: 12.9 % (ref 11.5–15.5)
WBC: 11.6 K/uL — ABNORMAL HIGH (ref 4.0–10.5)
nRBC: 0 % (ref 0.0–0.2)

## 2024-06-01 LAB — GLUCOSE, CAPILLARY: Glucose-Capillary: 155 mg/dL — ABNORMAL HIGH (ref 70–99)

## 2024-06-01 MED ORDER — SPIRONOLACTONE 25 MG PO TABS: 12.5000 mg | ORAL_TABLET | Freq: Every morning | ORAL | Status: DC

## 2024-06-01 NOTE — Progress Notes (Signed)
 PCP - Dr. Worth Kitty Cardiologist - Dr. Annabella Scarce, LOV 02/17/2023, clearance pending  PPM/ICD - denies Device Orders - na Rep Notified - na  Chest x-ray - 12/29/2023 EKG - 12/29/2023 Stress Test -  10/02/2017 ECHO - 07/22/2019 Cardiac Cath -   Sleep Study - denies CPAP - na  Type II diabetic. A1C 7.2 on 05/23/2024. Blood sugar 155 at PAT appointment Fasting Blood Sugar : 60-80 Checks Blood Sugar : 6 times a day  Last dose of GLP1 agonist-  denies GLP1 instructions: na  Blood Thinner Instructions: denies Aspirin  Instructions: continue  ERAS Protcol - Clears until 0530  Anesthesia review: Yes. HTN, CKD, DM, CHF, MI.   Patient denies shortness of breath, fever, cough and chest pain at PAT appointment   All instructions explained to the patient, with a verbal understanding of the material. Patient agrees to go over the instructions while at home for a better understanding. Patient also instructed to self quarantine after being tested for COVID-19. The opportunity to ask questions was provided.

## 2024-06-01 NOTE — Patient Instructions (Signed)
 Thank you for choosing [facility]!     Medication Instructions:  No medication changes were made during today's visit.   *If you need a refill on your cardiac medications before your next appointment, please call your pharmacy*   Lab Work: No labs were ordered during today's visit.  If you have labs (blood work) drawn today and your tests are completely normal, you will receive your results only by: MyChart Message (if you have MyChart) OR A paper copy in the mail If you have any lab test that is abnormal or we need to change your treatment, we will call you to review the results.   Testing/Procedures: Your physician has requested that you have an echocardiogram. Echocardiography is a painless test that uses sound waves to create images of your heart. It provides your doctor with information about the size and shape of your heart and how well your heart's chambers and valves are working. This procedure takes approximately one hour. There are no restrictions for this procedure. Please do NOT wear cologne, perfume, aftershave, or lotions (deodorant is allowed). Please arrive 15 minutes prior to your appointment time.  Please note: We ask at that you not bring children with you during ultrasound (echo/ vascular) testing. Due to room size and safety concerns, children are not allowed in the ultrasound rooms during exams. Our front office staff cannot provide observation of children in our lobby area while testing is being conducted. An adult accompanying a patient to their appointment will only be allowed in the ultrasound room at the discretion of the ultrasound technician under special circumstances. We apologize for any inconvenience.   Your next appointment:   6 month(s)   Provider:   Aline Door, PA-C           Follow-Up: At [facility], you and your health needs are our priority.  As part of our continuing mission to provide you with exceptional heart care,  we have created designated Provider Care Teams.  These Care Teams include your primary Cardiologist (physician) and Advanced Practice Providers (APPs -  Physician Assistants and Nurse Practitioners) who all work together to provide you with the care you need, when you need it. We recommend signing up for the patient portal called MyChart.  Sign up information is provided on this After Visit Summary.  MyChart is used to connect with patients for Virtual Visits (Telemedicine).  Patients are able to view lab/test results, encounter notes, upcoming appointments, etc.  Non-urgent messages can be sent to your provider as well.   To learn more about what you can do with MyChart, go to [email or web address].

## 2024-06-02 ENCOUNTER — Encounter: Admitting: Physical Therapy

## 2024-06-02 ENCOUNTER — Other Ambulatory Visit: Payer: Self-pay

## 2024-06-02 ENCOUNTER — Telehealth: Payer: Self-pay

## 2024-06-02 DIAGNOSIS — G912 (Idiopathic) normal pressure hydrocephalus: Secondary | ICD-10-CM

## 2024-06-02 NOTE — Progress Notes (Signed)
 Anesthesia Chart Review:  Case: [id number] Date/Time: 06/06/24 1303   Procedures:      SHUNT INSERTION VENTRICULAR-PERITONEAL (Right)     COMPUTER-ASSISTED NAVIGATION, FOR CRANIAL PROCEDURE (Right) - COMPUTER-ASSISTED NAVIGATION, FOR CRANIAL PROCEDURE   Anesthesia type: General   Diagnosis: NPH (normal pressure hydrocephalus) ([name]) [G91.2]   Pre-op diagnosis: G91.2 NPH   [facility] ROOM [facility] / [facility]   Surgeons: Rosslyn Dino HERO, MD       DISCUSSION: Patient is a 68 year old male scheduled for the above procedure. Head CT on 12/29/2023 for episode of lightheadedness and dizziness showed ventriculomegaly with callosum angle narrowing and disproportionate subarachnoid spaces. Subsequent MRI findings consistent with normal pressure hydrocephalus. He had also noted some progressive gait instability, so referred to neurology. S/p LP on 05/06/2024 and noted to have improvement with mobility and cognition, so referred for above procedure.    History includes never smoker, HTN, HLD, CAD (MI/cardiac arrests 11/23/2013, s/p PCI LAD), CHF, DM2 (with retinopathy), CKD (Stage IV), sleep disordered breathing, vitreal hemorrhage (2016, s/p laser surgery, Plavix discontinued), memory loss, normal pressure hydrocephalus.  He last had a cardiology evaluation on 06/01/2024 with Goodrich, Callie, PA-C for preoperative evaluation. She wrote, Patient has upcoming ventricular-peritoneal shunt insertion planned for treatment of normal pressure hydrocephalus. Revised Cardiac Risk Index = 4 (CAD with prior MI, CHF, CKD with creatinine >1.5, and insulin  dependent DM) indicating he has a 10% risk of a major cardiac event perioperatively.  However, he is well optimized from a cardiac standpoint. His activity is limited by his balance issues from the normal pressure hydrocephalus, but he is able to complete right around 4.0 METS of physical activity without anginal symptoms.  He states his activity level and functional status is  unchanged from when he had the Myoview  in 2019 which was low risk. He understands that he is at a higher risk of adverse cardiac events given his medical history/ comorbidities and is willing and eager to proceed with surgery. Based on ACC/AHA guidelines, patient would be at acceptable risk for the planned procedure without further cardiovascular testing. Per PAT notes, he is continuing ASA.    A1c 7.2% on 05/23/2024.  Anesthesia team to evaluate on the day of surgery.   VS: BP 138/72   Pulse 76   Temp 36.7 C   Ht 5' 7 (1.702 m)   Wt 76.4 kg   SpO2 98%   BMI 26.38 kg/m    PROVIDERS: Kennyth Worth HERO, MD is PCP. Last visit 05/23/2024. He is aware of surgery plans.  Raford Riggs, MD is cardiologist Leigh Ditch, MD is neurologist Dolan Casco, MD is nephrologist   LABS: Labs reviewed: Acceptable for surgery.  Creatinine 2.13, but appears consistent with prior results from May 2025 and known CKD.  A1c 7.2% on 05/23/2024. (all labs ordered are listed, but only abnormal results are displayed)  Labs Reviewed  GLUCOSE, CAPILLARY - Abnormal; Notable for the following components:      Result Value   Glucose-Capillary 155 (*)    All other components within normal limits  BASIC METABOLIC PANEL WITH GFR - Abnormal; Notable for the following components:   Sodium 134 (*)    Glucose, Bld 120 (*)    BUN 43 (*)    Creatinine, Ser 2.13 (*)    GFR, Estimated 33 (*)    All other components within normal limits  CBC - Abnormal; Notable for the following components:   WBC 11.6 (*)    Platelets 426 (*)  All other components within normal limits     IMAGES: MRI brain 01/15/2024: IMPRESSION: 1. Stable chronic ventricular enlargement. The callosal angle measures 59 degrees. This is consistent with normal pressure hydrocephalus. 2. Mild periventricular T2 hyperintensities bilaterally extend into the brainstem. This likely reflects the sequela of chronic microvascular ischemia. 3.  No acute intracranial abnormality or significant interval change.   CTA Chest 12/29/2023: IMPRESSION: 1. No embolism to the proximal subsegmental pulmonary artery level. 2. No lung mass, consolidation, pleural effusion or pneumothorax. 3. Multiple other nonacute observations, as described above. - Aortic Atherosclerosis (ICD10-I70.0).   PCXR 12/29/2023: FINDINGS: No focal consolidation, pleural effusion, or pneumothorax. Stable cardiac silhouette. Atherosclerotic calcification of the aorta. No acute osseous pathology. IMPRESSION: No active disease.   US  Renal 02/17/2023: IMPRESSION: 1. Increased cortical echogenicity as can be seen with chronic medical renal disease. 2. No hydronephrosis.    EKG: 06/01/2024: Normal sinus rhythm Left axis deviation Low voltage QRS Inferior infarct , age undetermined Anterolateral infarct , age undetermined No significant changes since last tracing Confirmed by Jadine Patient [phone]) on 06/01/2024 3:53:59 PM   CV: Echo 07/22/2019: IMPRESSIONS   1. Left ventricular ejection fraction, by visual estimation, is 50 to  55%. The left ventricle has low normal function. Left ventricular septal  wall thickness was mildly increased. There is no left ventricular  hypertrophy.   2. The left ventricle demonstrates regional wall motion abnormalities.   3. Normal GLS -18 Inferior basal and distal septal hypokinesis.   4. Global right ventricle has normal systolic function.The right  ventricular size is normal. No increase in right ventricular wall  thickness.   5. Left atrial size was mildly dilated.   6. Right atrial size was normal.   7. The mitral valve is normal in structure. Trivial mitral valve  regurgitation. No evidence of mitral stenosis.   8. The tricuspid valve is normal in structure. Tricuspid valve  regurgitation is not demonstrated.   9. The aortic valve is tricuspid. Aortic valve regurgitation is mild.  Mild to moderate aortic valve  sclerosis/calcification without any evidence  of aortic stenosis.  10. The pulmonic valve was normal in structure. Pulmonic valve  regurgitation is not visualized.  11. Aortic dilatation noted.  12. There is mild dilatation of the aortic root measuring 39 mm.  13. The inferior vena cava is normal in size with greater than 50%  respiratory variability, suggesting right atrial pressure of 3 mmHg.  - In comparison to the previous echocardiogram(s): 09/28/17 EF 45-50%.    Nuclear stress test 10/02/2017: The left ventricular ejection fraction is mildly decreased (45-54%). Nuclear stress EF: 48%. There was no ST segment deviation noted during stress. Defect 1: There is a small defect of severe severity present in the apical inferior, apical lateral and apex location. This is a low risk study.   Abnormal, low risk stress nuclear study with prior apical infarct; no ischemia; EF 48 with mild global hypokinesis; mild LVE.   Past Medical History:  Diagnosis Date   Cardiac arrest ([name]) 05/22/2016   Cataract    CHF (congestive heart failure) ([name])    CKD (chronic kidney disease) stage 3, GFR 30-59 ml/min ([name]) 05/28/2021   CKD (chronic kidney disease) stage 4, GFR 15-29 ml/min ([name]) 05/28/2021   Coronary artery disease    LAD PCI   Diabetes mellitus without complication ([name])    type 2   Diabetic retinopathy ([name])    Hyperlipidemia    Hypertension    Memory loss  mild   Myocardial infarct ([name]) 2105   Retinopathy    Both eyes   S/P primary angioplasty with coronary stent 05/22/2016   Sleep-disordered breathing 12/31/2023   Vitamin B12 deficiency    Vitreous hemorrhage of left eye ([name])    proliferative diabetic retinopathy    Past Surgical History:  Procedure Laterality Date   ANGIOPLASTY     2015   COLONOSCOPY     multiple   EYE SURGERY     PARS PLANA VITRECTOMY Left 03/15/2020   Procedure: PARS PLANA VITRECTOMY WITH 25 GAUGE;  Surgeon: Jarold Mayo, MD;  Location: [facility] OR;   Service: Ophthalmology;  Laterality: Left;   PHOTOCOAGULATION WITH LASER Left 03/15/2020   Procedure: PHOTOCOAGULATION WITH LASER; INTRAVITREAL INJECTION OF AVASTIN ;  Surgeon: Jarold Mayo, MD;  Location: [facility] OR;  Service: Ophthalmology;  Laterality: Left;   REFRACTIVE SURGERY     UPPER GI ENDOSCOPY     several   VITRECTOMY      MEDICATIONS:  acetaminophen  (TYLENOL ) 325 MG tablet   aspirin  EC 81 MG tablet   atorvastatin  (LIPITOR ) 80 MG tablet   blood glucose meter kit and supplies   Blood Glucose Monitoring Suppl (ONETOUCH VERIO IQ SYSTEM) w/Device KIT   carboxymethylcellulose (REFRESH TEARS) 0.5 % SOLN   carvedilol  (COREG ) 3.125 MG tablet   Cholecalciferol (VITAMIN D -3) 125 MCG (5000 UT) TABS   cyanocobalamin  (VITAMIN B12) 1000 MCG tablet   furosemide  (LASIX ) 40 MG tablet   glucose blood (ONETOUCH VERIO) test strip   insulin  aspart (NOVOLOG ) 100 UNIT/ML injection   insulin  glargine (LANTUS  SOLOSTAR) 100 UNIT/ML Solostar Pen   Insulin  Pen Needle (PEN NEEDLES) 33G X 4 MM MISC   Insulin  Syringe-Needle U-100 (B-D INS SYR HALF-UNIT .3CC/31G) 31G X 5/16 0.3 ML MISC   OneTouch Delica Lancets 33G MISC   pantoprazole  (PROTONIX ) 40 MG tablet   spironolactone  (ALDACTONE ) 25 MG tablet   triamcinolone  cream (KENALOG ) 0.1 %   No current facility-administered medications for this encounter.    Isaiah Ruder, PA-C Surgical Short Stay/Anesthesiology [facility] Phone [phone] [facility] Phone [phone] 06/02/2024 10:54 PM

## 2024-06-02 NOTE — Anesthesia Preprocedure Evaluation (Addendum)
 Anesthesia Evaluation  Patient identified by MRN, date of birth, ID band Patient awake    Reviewed: Allergy & Precautions, Patient's Chart, lab work & pertinent test results  Airway Mallampati: II  TM Distance: >3 FB Neck ROM: Full    Dental  (+) Caps, Dental Advisory Given   Pulmonary    breath sounds clear to auscultation       Cardiovascular hypertension, Pt. on home beta blockers and Pt. on medications + CAD, + Past MI and +CHF   Rhythm:Regular Rate:Normal  Echo:   1. Left ventricular ejection fraction, by visual estimation, is 50 to  55%. The left ventricle has low normal function. Left ventricular septal  wall thickness was mildly increased. There is no left ventricular  hypertrophy.   2. The left ventricle demonstrates regional wall motion abnormalities.   3. Normal GLS -18 Inferior basal and distal septal hypokinesis.   4. Global right ventricle has normal systolic function.The right  ventricular size is normal. No increase in right ventricular wall  thickness.   5. Left atrial size was mildly dilated.   6. Right atrial size was normal.   7. The mitral valve is normal in structure. Trivial mitral valve  regurgitation. No evidence of mitral stenosis.   8. The tricuspid valve is normal in structure. Tricuspid valve  regurgitation is not demonstrated.   9. The aortic valve is tricuspid. Aortic valve regurgitation is mild.  Mild to moderate aortic valve sclerosis/calcification without any evidence  of aortic stenosis.  10. The pulmonic valve was normal in structure. Pulmonic valve  regurgitation is not visualized.  11. Aortic dilatation noted.  12. There is mild dilatation of the aortic root measuring 39 mm.  13. The inferior vena cava is normal in size with greater than 50%  respiratory variability, suggesting right atrial pressure of 3 mmHg.     Neuro/Psych  PSYCHIATRIC DISORDERS       Neuromuscular disease     GI/Hepatic Neg liver ROS,GERD  Medicated,,  Endo/Other  diabetes    Renal/GU Renal disease     Musculoskeletal   Abdominal   Peds  Hematology  (+) Blood dyscrasia, anemia   Anesthesia Other Findings   Reproductive/Obstetrics                              Anesthesia Physical Anesthesia Plan  ASA: 3  Anesthesia Plan: General   Post-op Pain Management: Tylenol  PO (pre-op)* and Toradol IV (intra-op)*   Induction: Intravenous  PONV Risk Score and Plan: 3 and Ondansetron , Dexamethasone  and Midazolam   Airway Management Planned: Oral ETT  Additional Equipment: None  Intra-op Plan:   Post-operative Plan: Extubation in OR  Informed Consent: I have reviewed the patients History and Physical, chart, labs and discussed the procedure including the risks, benefits and alternatives for the proposed anesthesia with the patient or authorized representative who has indicated his/her understanding and acceptance.     Dental advisory given  Plan Discussed with: CRNA  Anesthesia Plan Comments: (PAT note written 06/02/2024 by Allison Zelenak, PA-C.  )         Anesthesia Quick Evaluation

## 2024-06-02 NOTE — Telephone Encounter (Signed)
 Referral for home health sent to [facility] care as requested per patients brother.

## 2024-06-03 ENCOUNTER — Ambulatory Visit: Admitting: Neurosurgery

## 2024-06-03 DIAGNOSIS — G912 (Idiopathic) normal pressure hydrocephalus: Secondary | ICD-10-CM | POA: Diagnosis not present

## 2024-06-03 NOTE — Progress Notes (Signed)
 Surgery time was changed to 1320.  Pt is to arrive to Entrance A at 1120.  Brother Elsie was made aware of the change.

## 2024-06-03 NOTE — Progress Notes (Signed)
 Virtual Visit via Telephone Note  I connected with Jeffrey [name] on 06/03/24 at  7:20 AM EDT by telephone and verified that I am speaking with the correct person using two identifiers.  Location: Patient: Home Provider: Clinic   I discussed the limitations, risks, security and privacy concerns of performing an evaluation and management service by telephone and the availability of in person appointments. I also discussed with the patient that there may be a patient responsible charge related to this service. The patient expressed understanding and agreed to proceed.  68 year old gentleman with a picture of normal pressure hydrocephalus for which we recommended a ventriculoperitoneal shunt.  Had a conversation with him and his brother by phone today and went over the operation and the perioperative course again with them.  I told him that he is going to go home the next day and we have arranged for home health.  I gave him an explanation for why early ambulation is better than staying in the hospital.  We talked about the risks and benefits and he wants to proceed with the surgery on Monday.    I discussed the assessment and treatment plan with the patient. The patient was provided an opportunity to ask questions and all were answered. The patient agreed with the plan and demonstrated an understanding of the instructions.   The patient was advised to call back or seek an in-person evaluation if the symptoms worsen or if the condition fails to improve as anticipated.  I provided 10 minutes of non-face-to-face time during this encounter.   Tashima Scarpulla, MD

## 2024-06-06 ENCOUNTER — Inpatient Hospital Stay (HOSPITAL_COMMUNITY): Payer: Self-pay | Admitting: Vascular Surgery

## 2024-06-06 ENCOUNTER — Inpatient Hospital Stay (HOSPITAL_COMMUNITY)

## 2024-06-06 ENCOUNTER — Inpatient Hospital Stay (HOSPITAL_COMMUNITY)
Admission: RE | Admit: 2024-06-06 | Discharge: 2024-06-07 | DRG: 032 | Disposition: A | Discharge status: Home / Self Care | Attending: Neurosurgery | Admitting: Neurosurgery

## 2024-06-06 ENCOUNTER — Ambulatory Visit (HOSPITAL_BASED_OUTPATIENT_CLINIC_OR_DEPARTMENT_OTHER): Admitting: Cardiovascular Disease

## 2024-06-06 ENCOUNTER — Inpatient Hospital Stay (HOSPITAL_COMMUNITY): Payer: Self-pay | Admitting: Anesthesiology

## 2024-06-06 ENCOUNTER — Encounter (HOSPITAL_COMMUNITY)
Admission: RE | Disposition: A | Discharge status: Home / Self Care | Payer: Self-pay | Source: Home / Self Care | Attending: Neurosurgery

## 2024-06-06 ENCOUNTER — Encounter (HOSPITAL_COMMUNITY): Payer: Self-pay | Admitting: Neurosurgery

## 2024-06-06 DIAGNOSIS — Z79899 Other long term (current) drug therapy: Secondary | ICD-10-CM

## 2024-06-06 DIAGNOSIS — Z794 Long term (current) use of insulin: Secondary | ICD-10-CM | POA: Diagnosis not present

## 2024-06-06 DIAGNOSIS — Z885 Allergy status to narcotic agent status: Secondary | ICD-10-CM | POA: Diagnosis not present

## 2024-06-06 DIAGNOSIS — Z4682 Encounter for fitting and adjustment of non-vascular catheter: Secondary | ICD-10-CM | POA: Diagnosis not present

## 2024-06-06 DIAGNOSIS — E1122 Type 2 diabetes mellitus with diabetic chronic kidney disease: Secondary | ICD-10-CM | POA: Diagnosis not present

## 2024-06-06 DIAGNOSIS — Z880 Allergy status to penicillin: Secondary | ICD-10-CM | POA: Diagnosis not present

## 2024-06-06 DIAGNOSIS — Z888 Allergy status to other drugs, medicaments and biological substances status: Secondary | ICD-10-CM | POA: Diagnosis not present

## 2024-06-06 DIAGNOSIS — I2510 Atherosclerotic heart disease of native coronary artery without angina pectoris: Secondary | ICD-10-CM

## 2024-06-06 DIAGNOSIS — R0989 Other specified symptoms and signs involving the circulatory and respiratory systems: Secondary | ICD-10-CM | POA: Diagnosis not present

## 2024-06-06 DIAGNOSIS — Z8674 Personal history of sudden cardiac arrest: Secondary | ICD-10-CM

## 2024-06-06 DIAGNOSIS — G912 (Idiopathic) normal pressure hydrocephalus: Secondary | ICD-10-CM

## 2024-06-06 DIAGNOSIS — G9389 Other specified disorders of brain: Secondary | ICD-10-CM | POA: Diagnosis not present

## 2024-06-06 DIAGNOSIS — E113599 Type 2 diabetes mellitus with proliferative diabetic retinopathy without macular edema, unspecified eye: Secondary | ICD-10-CM | POA: Diagnosis present

## 2024-06-06 DIAGNOSIS — E785 Hyperlipidemia, unspecified: Secondary | ICD-10-CM | POA: Diagnosis present

## 2024-06-06 DIAGNOSIS — I252 Old myocardial infarction: Secondary | ICD-10-CM | POA: Diagnosis not present

## 2024-06-06 DIAGNOSIS — Z955 Presence of coronary angioplasty implant and graft: Secondary | ICD-10-CM

## 2024-06-06 DIAGNOSIS — I509 Heart failure, unspecified: Secondary | ICD-10-CM | POA: Diagnosis not present

## 2024-06-06 DIAGNOSIS — Z7982 Long term (current) use of aspirin: Secondary | ICD-10-CM | POA: Diagnosis not present

## 2024-06-06 DIAGNOSIS — Z9889 Other specified postprocedural states: Secondary | ICD-10-CM | POA: Diagnosis not present

## 2024-06-06 DIAGNOSIS — Z820 Family history of epilepsy and other diseases of the nervous system: Secondary | ICD-10-CM

## 2024-06-06 DIAGNOSIS — N184 Chronic kidney disease, stage 4 (severe): Secondary | ICD-10-CM | POA: Diagnosis not present

## 2024-06-06 DIAGNOSIS — I129 Hypertensive chronic kidney disease with stage 1 through stage 4 chronic kidney disease, or unspecified chronic kidney disease: Secondary | ICD-10-CM | POA: Diagnosis present

## 2024-06-06 DIAGNOSIS — E1142 Type 2 diabetes mellitus with diabetic polyneuropathy: Secondary | ICD-10-CM

## 2024-06-06 DIAGNOSIS — Z8249 Family history of ischemic heart disease and other diseases of the circulatory system: Secondary | ICD-10-CM

## 2024-06-06 DIAGNOSIS — I110 Hypertensive heart disease with heart failure: Secondary | ICD-10-CM | POA: Diagnosis not present

## 2024-06-06 DIAGNOSIS — Z982 Presence of cerebrospinal fluid drainage device: Secondary | ICD-10-CM | POA: Diagnosis not present

## 2024-06-06 DIAGNOSIS — I517 Cardiomegaly: Secondary | ICD-10-CM | POA: Diagnosis not present

## 2024-06-06 HISTORY — PX: VENTRICULOPERITONEAL SHUNT: SHX204

## 2024-06-06 LAB — GLUCOSE, CAPILLARY
Glucose-Capillary: 108 mg/dL — ABNORMAL HIGH (ref 70–99)
Glucose-Capillary: 112 mg/dL — ABNORMAL HIGH (ref 70–99)
Glucose-Capillary: 109 mg/dL — ABNORMAL HIGH (ref 70–99)
Glucose-Capillary: 126 mg/dL — ABNORMAL HIGH (ref 70–99)
Glucose-Capillary: 183 mg/dL — ABNORMAL HIGH (ref 70–99)

## 2024-06-06 SURGERY — SHUNT INSERTION VENTRICULAR-PERITONEAL
Anesthesia: General | Laterality: Right

## 2024-06-06 MED ORDER — BUTALBITAL-APAP-CAFFEINE 50-325-40 MG PO TABS: 2.0000 | ORAL_TABLET | ORAL | Status: DC | PRN

## 2024-06-06 MED ORDER — FENTANYL CITRATE (PF) 100 MCG/2ML IJ SOLN
INTRAMUSCULAR | Status: DC | PRN
  Administered 2024-06-06 (×2): 50 ug via INTRAVENOUS

## 2024-06-06 MED ORDER — ONDANSETRON HCL 4 MG/2ML IJ SOLN
INTRAMUSCULAR | Status: AC
  Filled 2024-06-06: qty 2

## 2024-06-06 MED ORDER — CEFAZOLIN SODIUM-DEXTROSE 2-4 GM/100ML-% IV SOLN
INTRAVENOUS | Status: AC
  Filled 2024-06-06: qty 100

## 2024-06-06 MED ORDER — PHENYLEPHRINE 80 MCG/ML (10ML) SYRINGE FOR IV PUSH (FOR BLOOD PRESSURE SUPPORT)
PREFILLED_SYRINGE | INTRAVENOUS | Status: DC | PRN
  Administered 2024-06-06: 80 ug via INTRAVENOUS
  Administered 2024-06-06: 160 ug via INTRAVENOUS

## 2024-06-06 MED ORDER — LABETALOL HCL 5 MG/ML IV SOLN: 10.0000 mg | INTRAVENOUS | Status: DC | PRN

## 2024-06-06 MED ORDER — ASPIRIN 81 MG PO TBEC
81.0000 mg | DELAYED_RELEASE_TABLET | Freq: Every morning | ORAL | Status: DC
  Administered 2024-06-07: 81 mg via ORAL
  Filled 2024-06-06: qty 1

## 2024-06-06 MED ORDER — ONDANSETRON HCL 4 MG PO TABS: 4.0000 mg | ORAL_TABLET | ORAL | Status: DC | PRN

## 2024-06-06 MED ORDER — LIDOCAINE 2% (20 MG/ML) 5 ML SYRINGE
INTRAMUSCULAR | Status: AC
  Filled 2024-06-06: qty 5

## 2024-06-06 MED ORDER — SUGAMMADEX SODIUM 200 MG/2ML IV SOLN
INTRAVENOUS | Status: DC | PRN
  Administered 2024-06-06: 200 mg via INTRAVENOUS

## 2024-06-06 MED ORDER — DROPERIDOL 2.5 MG/ML IJ SOLN: 0.6250 mg | Freq: Once | INTRAMUSCULAR | Status: DC | PRN

## 2024-06-06 MED ORDER — CHLORHEXIDINE GLUCONATE 0.12 % MT SOLN: 15.0000 mL | Freq: Once | OROMUCOSAL | Status: AC

## 2024-06-06 MED ORDER — CEFAZOLIN SODIUM-DEXTROSE 2-4 GM/100ML-% IV SOLN
2.0000 g | Freq: Three times a day (TID) | INTRAVENOUS | Status: AC
  Administered 2024-06-06 – 2024-06-07 (×3): 2 g via INTRAVENOUS
  Filled 2024-06-06 (×3): qty 100

## 2024-06-06 MED ORDER — PROPOFOL 10 MG/ML IV BOLUS
INTRAVENOUS | Status: AC
Start: 2024-06-06 — End: 2024-06-06
  Filled 2024-06-06: qty 20

## 2024-06-06 MED ORDER — CARVEDILOL 3.125 MG PO TABS
3.1250 mg | ORAL_TABLET | Freq: Two times a day (BID) | ORAL | Status: DC
  Administered 2024-06-06 – 2024-06-07 (×2): 3.125 mg via ORAL
  Filled 2024-06-06 (×2): qty 1

## 2024-06-06 MED ORDER — OXYCODONE HCL 5 MG PO TABS: 5.0000 mg | ORAL_TABLET | Freq: Once | ORAL | Status: DC | PRN

## 2024-06-06 MED ORDER — OXYCODONE HCL 5 MG/5ML PO SOLN: 5.0000 mg | Freq: Once | ORAL | Status: DC | PRN

## 2024-06-06 MED ORDER — ROCURONIUM BROMIDE 10 MG/ML (PF) SYRINGE
PREFILLED_SYRINGE | INTRAVENOUS | Status: AC
  Filled 2024-06-06: qty 10

## 2024-06-06 MED ORDER — ROCURONIUM BROMIDE 10 MG/ML (PF) SYRINGE
PREFILLED_SYRINGE | INTRAVENOUS | Status: DC | PRN
  Administered 2024-06-06: 50 mg via INTRAVENOUS

## 2024-06-06 MED ORDER — LIDOCAINE-EPINEPHRINE 1 %-1:100000 IJ SOLN
INTRAMUSCULAR | Status: DC | PRN
  Administered 2024-06-06: 10 mL

## 2024-06-06 MED ORDER — CHLORHEXIDINE GLUCONATE 0.12 % MT SOLN
OROMUCOSAL | Status: AC
  Administered 2024-06-06: 15 mL via OROMUCOSAL
  Filled 2024-06-06: qty 15

## 2024-06-06 MED ORDER — PHENYLEPHRINE HCL-NACL 20-0.9 MG/250ML-% IV SOLN
INTRAVENOUS | Status: DC | PRN
  Administered 2024-06-06: 20 ug/min via INTRAVENOUS

## 2024-06-06 MED ORDER — VITAMIN D 25 MCG (1000 UNIT) PO TABS
5000.0000 [IU] | ORAL_TABLET | Freq: Every morning | ORAL | Status: DC
  Administered 2024-06-07: 5000 [IU] via ORAL
  Filled 2024-06-06: qty 5

## 2024-06-06 MED ORDER — PANTOPRAZOLE SODIUM 40 MG IV SOLR
40.0000 mg | Freq: Every day | INTRAVENOUS | Status: DC
  Administered 2024-06-06: 40 mg via INTRAVENOUS
  Filled 2024-06-06: qty 10

## 2024-06-06 MED ORDER — FENTANYL CITRATE (PF) 100 MCG/2ML IJ SOLN: 25.0000 ug | INTRAMUSCULAR | Status: DC | PRN

## 2024-06-06 MED ORDER — FUROSEMIDE 40 MG PO TABS
40.0000 mg | ORAL_TABLET | Freq: Every day | ORAL | Status: DC
  Administered 2024-06-06 – 2024-06-07 (×2): 40 mg via ORAL
  Filled 2024-06-06 (×2): qty 1

## 2024-06-06 MED ORDER — VITAMIN B-12 1000 MCG PO TABS
1000.0000 ug | ORAL_TABLET | Freq: Every morning | ORAL | Status: DC
  Administered 2024-06-07: 1000 ug via ORAL
  Filled 2024-06-06: qty 1

## 2024-06-06 MED ORDER — LIDOCAINE 2% (20 MG/ML) 5 ML SYRINGE
INTRAMUSCULAR | Status: DC | PRN
  Administered 2024-06-06: 40 mg via INTRAVENOUS

## 2024-06-06 MED ORDER — HYDROCODONE-ACETAMINOPHEN 5-325 MG PO TABS
1.0000 | ORAL_TABLET | Freq: Four times a day (QID) | ORAL | Status: DC | PRN
  Administered 2024-06-06 – 2024-06-07 (×2): 1 via ORAL
  Filled 2024-06-06 (×2): qty 1

## 2024-06-06 MED ORDER — CEFAZOLIN SODIUM-DEXTROSE 2-4 GM/100ML-% IV SOLN
2.0000 g | Freq: Once | INTRAVENOUS | Status: AC
  Administered 2024-06-06: 2 g via INTRAVENOUS

## 2024-06-06 MED ORDER — PHENYLEPHRINE 80 MCG/ML (10ML) SYRINGE FOR IV PUSH (FOR BLOOD PRESSURE SUPPORT)
PREFILLED_SYRINGE | INTRAVENOUS | Status: AC
  Filled 2024-06-06: qty 10

## 2024-06-06 MED ORDER — PROPOFOL 10 MG/ML IV BOLUS
INTRAVENOUS | Status: DC | PRN
  Administered 2024-06-06: 140 mg via INTRAVENOUS

## 2024-06-06 MED ORDER — INSULIN ASPART 100 UNIT/ML IJ SOLN
0.0000 [IU] | Freq: Three times a day (TID) | INTRAMUSCULAR | Status: DC
  Administered 2024-06-06: 2 [IU] via SUBCUTANEOUS
  Administered 2024-06-07: 5 [IU] via SUBCUTANEOUS
  Administered 2024-06-07: 3 [IU] via SUBCUTANEOUS

## 2024-06-06 MED ORDER — PANTOPRAZOLE SODIUM 40 MG PO TBEC: 40.0000 mg | DELAYED_RELEASE_TABLET | Freq: Every day | ORAL | Status: DC

## 2024-06-06 MED ORDER — INSULIN ASPART 100 UNIT/ML IJ SOLN: 0.0000 [IU] | INTRAMUSCULAR | Status: DC | PRN

## 2024-06-06 MED ORDER — BACLOFEN 10 MG PO TABS
10.0000 mg | ORAL_TABLET | Freq: Two times a day (BID) | ORAL | Status: DC
  Administered 2024-06-06 – 2024-06-07 (×2): 10 mg via ORAL
  Filled 2024-06-06 (×2): qty 1

## 2024-06-06 MED ORDER — POTASSIUM CHLORIDE IN NACL 20-0.9 MEQ/L-% IV SOLN
INTRAVENOUS | Status: DC
  Filled 2024-06-06: qty 1000

## 2024-06-06 MED ORDER — ORAL CARE MOUTH RINSE: 15.0000 mL | Freq: Once | OROMUCOSAL | Status: AC

## 2024-06-06 MED ORDER — SODIUM CHLORIDE 0.9 % IV SOLN: INTRAVENOUS | Status: DC | PRN

## 2024-06-06 MED ORDER — 0.9 % SODIUM CHLORIDE (POUR BTL) OPTIME
TOPICAL | Status: DC | PRN
  Administered 2024-06-06: 1000 mL

## 2024-06-06 MED ORDER — LIDOCAINE-EPINEPHRINE 1 %-1:100000 IJ SOLN
INTRAMUSCULAR | Status: AC
  Filled 2024-06-06: qty 1

## 2024-06-06 MED ORDER — FENTANYL CITRATE (PF) 50 MCG/ML IJ SOSY: 25.0000 ug | PREFILLED_SYRINGE | INTRAMUSCULAR | Status: DC | PRN

## 2024-06-06 MED ORDER — REMIFENTANIL HCL 2 MG IV SOLR
INTRAVENOUS | Status: AC
  Filled 2024-06-06: qty 2000

## 2024-06-06 MED ORDER — ACETAMINOPHEN 650 MG RE SUPP: 650.0000 mg | RECTAL | Status: DC | PRN

## 2024-06-06 MED ORDER — LACTATED RINGERS IV SOLN: INTRAVENOUS | Status: DC

## 2024-06-06 MED ORDER — BACITRACIN ZINC 500 UNIT/GM EX OINT
TOPICAL_OINTMENT | CUTANEOUS | Status: DC | PRN
  Administered 2024-06-06: 1 via TOPICAL

## 2024-06-06 MED ORDER — ONDANSETRON HCL 4 MG/2ML IJ SOLN
INTRAMUSCULAR | Status: DC | PRN
Start: 2024-06-06 — End: 2024-06-06
  Administered 2024-06-06: 4 mg via INTRAVENOUS

## 2024-06-06 MED ORDER — SODIUM CHLORIDE 0.9 % IV SOLN
INTRAVENOUS | Status: DC | PRN
  Administered 2024-06-06: .2 ug/kg/min via INTRAVENOUS

## 2024-06-06 MED ORDER — POLYVINYL ALCOHOL 1.4 % OP SOLN: 1.0000 [drp] | Freq: Three times a day (TID) | OPHTHALMIC | Status: DC | PRN

## 2024-06-06 MED ORDER — DEXAMETHASONE SOD PHOSPHATE PF 10 MG/ML IJ SOLN
INTRAMUSCULAR | Status: DC | PRN
  Administered 2024-06-06: 10 mg via INTRAVENOUS

## 2024-06-06 MED ORDER — ACETAMINOPHEN 325 MG PO TABS
650.0000 mg | ORAL_TABLET | ORAL | Status: DC | PRN
  Administered 2024-06-06 – 2024-06-07 (×2): 650 mg via ORAL
  Filled 2024-06-06 (×2): qty 2

## 2024-06-06 MED ORDER — FENTANYL CITRATE (PF) 100 MCG/2ML IJ SOLN
INTRAMUSCULAR | Status: AC
  Filled 2024-06-06: qty 2

## 2024-06-06 MED ORDER — ACETAMINOPHEN 10 MG/ML IV SOLN: 1000.0000 mg | Freq: Once | INTRAVENOUS | Status: DC | PRN

## 2024-06-06 MED ORDER — SPIRONOLACTONE 12.5 MG HALF TABLET
12.5000 mg | ORAL_TABLET | Freq: Every morning | ORAL | Status: DC
  Administered 2024-06-07: 12.5 mg via ORAL
  Filled 2024-06-06: qty 1

## 2024-06-06 MED ORDER — ACETAMINOPHEN 325 MG PO TABS: 325.0000 mg | ORAL_TABLET | Freq: Four times a day (QID) | ORAL | Status: DC | PRN

## 2024-06-06 MED ORDER — BACITRACIN ZINC 500 UNIT/GM EX OINT
TOPICAL_OINTMENT | CUTANEOUS | Status: AC
  Filled 2024-06-06: qty 28.35

## 2024-06-06 MED ORDER — ATORVASTATIN CALCIUM 80 MG PO TABS
80.0000 mg | ORAL_TABLET | Freq: Every day | ORAL | Status: DC
  Administered 2024-06-07: 80 mg via ORAL
  Filled 2024-06-06: qty 1

## 2024-06-06 MED ORDER — INSULIN ASPART 100 UNIT/ML IJ SOLN: 0.0000 [IU] | Freq: Every day | INTRAMUSCULAR | Status: DC

## 2024-06-06 MED ORDER — ONDANSETRON HCL 4 MG/2ML IJ SOLN: 4.0000 mg | INTRAMUSCULAR | Status: DC | PRN

## 2024-06-06 MED ORDER — PROMETHAZINE HCL 12.5 MG PO TABS: 12.5000 mg | ORAL_TABLET | ORAL | Status: DC | PRN

## 2024-06-06 SURGICAL SUPPLY — 57 items
BAG COUNTER SPONGE SURGICOUNT (BAG) ×1 IMPLANT
BENZOIN TINCTURE PRP APPL 2/3 (GAUZE/BANDAGES/DRESSINGS) ×3 IMPLANT
BLADE SURG 11 STRL SS (BLADE) ×1 IMPLANT
BUR ACORN 6.0 PRECISION (BURR) ×1 IMPLANT
CANISTER SUCTION 3000ML PPV (SUCTIONS) ×1 IMPLANT
CHLORAPREP W/TINT 26 (MISCELLANEOUS) ×2 IMPLANT
CLAMP SUTURE YELLOW 5 PAIRS (MISCELLANEOUS) IMPLANT
CNTNR URN SCR LID CUP LEK RST (MISCELLANEOUS) ×2 IMPLANT
COMB HAIR BLACK 7 SU STRL (MISCELLANEOUS) ×1 IMPLANT
DERMABOND ADVANCED .7 DNX12 (GAUZE/BANDAGES/DRESSINGS) IMPLANT
DRAPE HALF SHEET 40X57 (DRAPES) ×2 IMPLANT
DRAPE IMP U-DRAPE 54X76 (DRAPES) ×1 IMPLANT
DRAPE INCISE IOBAN 85X60 (DRAPES) ×1 IMPLANT
DRAPE SURG 17X23 STRL (DRAPES) ×4 IMPLANT
DRAPE SURG ORHT 6 SPLT 77X108 (DRAPES) ×2 IMPLANT
DRAPE UTILITY 15X26 TOWEL STRL (DRAPES) ×2 IMPLANT
DRSG TEGADERM 4X4.75 (GAUZE/BANDAGES/DRESSINGS) ×1 IMPLANT
DRSG TELFA 3X8 NADH STRL (GAUZE/BANDAGES/DRESSINGS) ×1 IMPLANT
ELECTRODE REM PT RTRN 9FT ADLT (ELECTROSURGICAL) ×1 IMPLANT
FEE COVERAGE SUPPORT O-ARM (MISCELLANEOUS) ×1 IMPLANT
FORCEPS BIPOLAR SPETZLER 8 1.0 (NEUROSURGERY SUPPLIES) ×1 IMPLANT
GAUZE 4X4 16PLY ~~LOC~~+RFID DBL (SPONGE) IMPLANT
GAUZE SPONGE 4X4 12PLY STRL (GAUZE/BANDAGES/DRESSINGS) IMPLANT
GLOVE BIOGEL M SZ8.5 STRL (GLOVE) ×2 IMPLANT
GLOVE BIOGEL PI IND STRL 8.5 (GLOVE) ×1 IMPLANT
GOWN STRL REUS W/ TWL LRG LVL3 (GOWN DISPOSABLE) IMPLANT
GOWN STRL REUS W/ TWL XL LVL3 (GOWN DISPOSABLE) IMPLANT
GOWN STRL SURGICAL XL XLNG (GOWN DISPOSABLE) ×1 IMPLANT
KIT BASIN OR (CUSTOM PROCEDURE TRAY) ×1 IMPLANT
KIT TURNOVER KIT B (KITS) ×1 IMPLANT
MARKER SKIN DUAL TIP RULER LAB (MISCELLANEOUS) IMPLANT
MARKER SPHERE PSV REFLC NDI (MISCELLANEOUS) IMPLANT
NDL HYPO 18GX1.5 BLUNT FILL (NEEDLE) ×1 IMPLANT
NDL HYPO 22X1.5 SAFETY MO (MISCELLANEOUS) ×1 IMPLANT
NEEDLE HYPO 18GX1.5 BLUNT FILL (NEEDLE) ×1 IMPLANT
NEEDLE HYPO 22X1.5 SAFETY MO (MISCELLANEOUS) ×1 IMPLANT
PACK LAMINECTOMY NEURO (CUSTOM PROCEDURE TRAY) ×1 IMPLANT
PASSER CATH 65CM DISP (NEUROSURGERY SUPPLIES) ×1 IMPLANT
PENCIL BUTTON HOLSTER BLD 10FT (ELECTRODE) ×1 IMPLANT
POINTER NAVIGATION AXIEM (INSTRUMENTS) IMPLANT
POINTER TRACER AXIEM (INSTRUMENTS) IMPLANT
SOL PREP POV-IOD 4OZ 10% (MISCELLANEOUS) ×1 IMPLANT
SOLN 0.9% NACL POUR BTL 1000ML (IV SOLUTION) ×1 IMPLANT
SOLN STERILE WATER BTL 1000 ML (IV SOLUTION) ×1 IMPLANT
SPIKE FLUID TRANSFER (MISCELLANEOUS) ×1 IMPLANT
STAPLER SKIN PROX 35W (STAPLE) ×1 IMPLANT
STYLET 2 COIL SINGLE (INSTRUMENTS) IMPLANT
SUT PROLENE 5 0 RB 2 (SUTURE) IMPLANT
SUT SILK 0 100YDS SPOOL (SUTURE) ×1 IMPLANT
SUT SILK 4 0 TIES 17X18 (SUTURE) IMPLANT
SUT VIC AB 0 CT2 8-18 (SUTURE) ×1 IMPLANT
SYR 3ML LL SCALE MARK (SYRINGE) ×1 IMPLANT
SYR CONTROL 10ML LL (SYRINGE) ×1 IMPLANT
TOWEL GREEN STERILE (TOWEL DISPOSABLE) ×1 IMPLANT
TOWEL GREEN STERILE FF (TOWEL DISPOSABLE) ×1 IMPLANT
TRACKER ENT PATIENT (MISCELLANEOUS) IMPLANT
VALVE PROGRAM RICKHAM MICRO (Valve) IMPLANT

## 2024-06-06 NOTE — Op Note (Signed)
 DATE OF SURGERY: 06/06/2024  ATTENDING SURGEON: Dino Sable, MD  PREOPERATIVE DIAGNOSIS: Normal Pressure Hydrocephalus  POSTOPERATIVE DIAGNOSIS: Normal Pressure Hydrocephalus    PROCEDURE PERFORMED:  1. Insertion of right parietal ventriculoperitoneal shunt (Hakim) - set at 150 mmHg.   2.  ANESTHESIA: General endotracheal anesthesia.    ESTIMATED BLOOD LOSS, URINE OUTPUT, AND CRYSTALLOIDS:   See anesthesia chart.    COMPLICATIONS: None.    SPECIMENS: None.    DRAINS: None.    PREOPERATIVE COURSE:   The patient is a 68 year old gentleman with NPH, who wanted to pursue ventriculoperitoneal shunt placement. Options were discussed with the patient doing nothing versus doing this procedure. We had extensive discussions about this procedure and these were documented in the outpatient clinic notes. The risks discussed of surgery included, but not limited to infection, hemorrhage, stroke, paralysis, blindness, speech impairment, coma, seizures, DVT, PE, cardiopulmonary complications, and death amongst others. CSF leak as well as need for reoperation were discussed with him as well. After all his questions were answered to his satisfaction extensively, he requested for us  to proceed.    DESCRIPTION OF PROCEDURE: The patient was brought to the operating room and after general endotracheal anesthesia was ensued, the patient was placed in supine position with the head turned to the left on a foam donut. Hereafter, an incision was marked behind the right ear two finger breaths above the mid-lambdoid lineas well as 3 fingerbreadths below her rib cage. The operative tract was outlined with sterile 1000 drapes and the patient was prepped and draped in usual sterile fashion using chlorhexidine  prep x2 and was allowed to dry. Hereafter, he was draped in usual sterile fashion after which abdominal incision was made and the external abdominal fascia was identified. This was tacked up and using blunt  dissection, the abdominal muscular fibers were separated and posterior abdominal fascia was identified. This was pulled up and cut exposing the peritoneum. This was pulled up and gently picked up on both sides allowing the underlying omentum to fall away and using a Metzenbaum scissor, a small opening was made and the omental fat could be identified. Using a Valsalva maneuver, mobility of the omental flap was confirmed and a Penfield #4 could easily be maneuvered in this space and for identification purposes left behind. Hereafter, an incision was made behind the right ear and the subgaleal space was identified. Using a shunt passer from proximal to distal, a 30-inch silk catheter was passed and the shunt passer was removed. Once this had been performed, hemostasis was obtained, copious irrigation with bacitracin saline was performed and using an acorn drill bit, a right parietal bur hole was made and the dura was exposed. After the dura was opened and cauterized and the pia was cauterized as well, access was gained to the right lateral ventricle with the ventricular catheter using the softpass technique once CSF was seen at the end of the catheter. Hereafter, the previously programmed Hakim Codman valve, which was manipulated only using forceps and had been flushed with bacitracin saline solution, was passed from proximal to distal and the Rickham reservoir was then connected to the ventricular catheter and secured with a 3-0 silk and the entire system was pulled taut. Egress of CSF was seen at the distal end and under direct visualization, it was inserted into the peritoneal cavity after which a pursestring suture was placed around the peritoneum and the posterior abdominal fascia. After this, the external abdominal fascia was closed using 0 Vicryl pop-offs. Copious irrigation  was performed with bacitracin saline and the fat and the skin were closed in separate layers. In the interim, the cranial incision had been  covered with a bacitracin-soaked sponge and was closed in separate layers. At the end of the procedure all counts were complete.

## 2024-06-06 NOTE — Anesthesia Procedure Notes (Signed)
 Procedure Name: Intubation Date/Time: 06/06/2024 12:49 PM  Performed by: Emmitt Millman, CRNAPre-anesthesia Checklist: Patient identified, Emergency Drugs available, Suction available and Patient being monitored Patient Re-evaluated:Patient Re-evaluated prior to induction Oxygen Delivery Method: Circle system utilized Preoxygenation: Pre-oxygenation with 100% oxygen Induction Type: IV induction Ventilation: Mask ventilation without difficulty Laryngoscope Size: Mac and 3 Grade View: Grade I Tube type: Oral Number of attempts: 1 Airway Equipment and Method: Stylet Placement Confirmation: ETT inserted through vocal cords under direct vision, positive ETCO2 and breath sounds checked- equal and bilateral Secured at: 23 cm Tube secured with: Tape Dental Injury: Teeth and Oropharynx as per pre-operative assessment

## 2024-06-06 NOTE — H&P (Signed)
 68yo gentleman with Nph  Past Medical History:  Diagnosis Date   Cardiac arrest ([name]) 05/22/2016   Cataract    CHF (congestive heart failure) ([name])    CKD (chronic kidney disease) stage 3, GFR 30-59 ml/min ([name]) 05/28/2021   CKD (chronic kidney disease) stage 4, GFR 15-29 ml/min ([name]) 05/28/2021   Coronary artery disease    LAD PCI   Diabetes mellitus without complication ([name])    type 2   Diabetic retinopathy ([name])    Hyperlipidemia    Hypertension    Memory loss    mild   Myocardial infarct ([name]) 2105   Retinopathy    Both eyes   S/P primary angioplasty with coronary stent 05/22/2016   Sleep-disordered breathing 12/31/2023   Vitamin B12 deficiency    Vitreous hemorrhage of left eye ([name])    proliferative diabetic retinopathy   Past Surgical History:  Procedure Laterality Date   ANGIOPLASTY     2015   COLONOSCOPY     multiple   EYE SURGERY     PARS PLANA VITRECTOMY Left 03/15/2020   Procedure: PARS PLANA VITRECTOMY WITH 25 GAUGE;  Surgeon: Jarold Mayo, MD;  Location: [facility] OR;  Service: Ophthalmology;  Laterality: Left;   PHOTOCOAGULATION WITH LASER Left 03/15/2020   Procedure: PHOTOCOAGULATION WITH LASER; INTRAVITREAL INJECTION OF AVASTIN ;  Surgeon: Jarold Mayo, MD;  Location: [facility] OR;  Service: Ophthalmology;  Laterality: Left;   REFRACTIVE SURGERY     UPPER GI ENDOSCOPY     several   VITRECTOMY     Social History   Socioeconomic History   Marital status: Single    Spouse name: Not on file   Number of children: Not on file   Years of education: Not on file   Highest education level: Not on file  Occupational History   Not on file  Tobacco Use   Smoking status: Never   Smokeless tobacco: Never  Vaping Use   Vaping status: Never Used  Substance and Sexual Activity   Alcohol use: Not Currently    Comment: occ   Drug use: No   Sexual activity: Not Currently  Other Topics Concern   Not on file  Social History Narrative   Are you right handed or left  handed? Right   Are you currently employed ?    What is your current occupation? retired   Do you live at home alone?   Who lives with you? Twin    What type of home do you live in: 1 story or 2 story? one   Caffeine none    Social Drivers of Corporate Investment Banker Strain: Not on file  Food Insecurity: No Food Insecurity (02/06/2020)   Hunger Vital Sign    Worried About Running Out of Food in the Last Year: Never true    Ran Out of Food in the Last Year: Never true  Transportation Needs: No Transportation Needs (02/06/2020)   PRAPARE - Administrator, Civil Service (Medical): No    Lack of Transportation (Non-Medical): No  Physical Activity: Not on file  Stress: Not on file  Social Connections: Not on file  Intimate Partner Violence: Not on file   Family History  Problem Relation Age of Onset   Alzheimer's disease Mother    Heart disease Father    Peripheral Artery Disease Father    Heart disease Brother    Colon polyps Neg Hx    Prostate cancer Neg Hx    Rectal cancer  Neg Hx    Esophageal cancer Neg Hx    Allergies  Allergen Reactions   Codeine Nausea And Vomiting   Farxiga  [Dapagliflozin ]     Constipation   Gabapentin      Mood changes, depression    Losartan     Penicillins Rash    Did it involve swelling of the face/tongue/throat, SOB, or low BP? no Did it involve sudden or severe rash/hives, skin peeling, or any reaction on the inside of your mouth or nose? yes Did you need to seek medical attention at a hospital or doctor's office? yes When did it last happen?    childhood   If all above answers are "NO", may proceed with cephalosporin use.    Scheduled Meds:  chlorhexidine   15 mL Mouth/Throat Once   Or   mouth rinse  15 mL Mouth Rinse Once   chlorhexidine        Continuous Infusions:  ceFAZolin     ceFAZolin     lactated ringers      PRN Meds:.ceFAZolin, chlorhexidine , insulin  aspart Vitals:   06/06/24 1105  BP: 126/64  Pulse: 71   Resp: 18  Temp: 98.2 F (36.8 C)  SpO2: 98%   Physical Exam HENT:     Head: Normocephalic.     Nose: Nose normal.  Eyes:     Pupils: Pupils are equal, round, and reactive to light.  Cardiovascular:     Rate and Rhythm: Normal rate.  Pulmonary:     Effort: Pulmonary effort is normal.  Abdominal:     General: Abdomen is flat.  Musculoskeletal:     Cervical back: Normal range of motion.  Neurological:     Mental Status: He is alert.   ASSESSMENT: NPH  PLAN: RIGHT VENTRICULOPERITONEAL SHUNT PLACEMENT

## 2024-06-06 NOTE — Progress Notes (Signed)
 Arrived from PACU via bed. A&O x 4. Brother at bedside. MD verbal order for regular diet. NS with 20 K+ started at 50 as ordered. Xray complete. Blood sugar checked. Voiced no complaints.

## 2024-06-06 NOTE — Transfer of Care (Signed)
 Immediate Anesthesia Transfer of Care Note  Patient: Jeffrey [name]  Procedure(s) Performed: SHUNT INSERTION VENTRICULAR-PERITONEAL (Right)  Patient Location: PACU  Anesthesia Type:General  Level of Consciousness: awake and drowsy  Airway & Oxygen Therapy: Patient Spontanous Breathing and Patient connected to face mask oxygen  Post-op Assessment: Report given to RN, Post -op Vital signs reviewed and stable, and Patient moving all extremities  Post vital signs: Reviewed and stable  Last Vitals:  Vitals Value Taken Time  BP 129/66 06/06/24 13:45  Temp    Pulse 75 06/06/24 13:50  Resp 24 06/06/24 13:50  SpO2 96 % 06/06/24 13:50  Vitals shown include unfiled device data.  Last Pain:  Vitals:   06/06/24 1219  TempSrc:   PainSc: 0-No pain         Complications: There were no known notable events for this encounter.

## 2024-06-06 NOTE — Anesthesia Postprocedure Evaluation (Signed)
 Anesthesia Post Note  Patient: Jeffrey [name]  Procedure(s) Performed: SHUNT INSERTION VENTRICULAR-PERITONEAL (Right)     Patient location during evaluation: PACU Anesthesia Type: General Level of consciousness: awake and alert Pain management: pain level controlled Vital Signs Assessment: post-procedure vital signs reviewed and stable Respiratory status: spontaneous breathing, nonlabored ventilation, respiratory function stable and patient connected to nasal cannula oxygen Cardiovascular status: blood pressure returned to baseline and stable Postop Assessment: no apparent nausea or vomiting Anesthetic complications: no   There were no known notable events for this encounter.  Last Vitals:  Vitals:   06/06/24 1645 06/06/24 1706  BP: 131/66 132/68  Pulse: 98 87  Resp: 20 18  Temp:  (!) 36.3 C  SpO2: 91% 96%    Last Pain:  Vitals:   06/06/24 1706  TempSrc: Oral  PainSc:                  [name] D [name]

## 2024-06-07 ENCOUNTER — Encounter: Admitting: Physical Therapy

## 2024-06-07 ENCOUNTER — Other Ambulatory Visit: Payer: Self-pay

## 2024-06-07 ENCOUNTER — Inpatient Hospital Stay (HOSPITAL_COMMUNITY)

## 2024-06-07 ENCOUNTER — Telehealth: Payer: Self-pay

## 2024-06-07 ENCOUNTER — Encounter (HOSPITAL_COMMUNITY): Payer: Self-pay | Admitting: Neurosurgery

## 2024-06-07 ENCOUNTER — Other Ambulatory Visit (HOSPITAL_COMMUNITY): Payer: Self-pay

## 2024-06-07 DIAGNOSIS — G912 (Idiopathic) normal pressure hydrocephalus: Secondary | ICD-10-CM | POA: Diagnosis not present

## 2024-06-07 DIAGNOSIS — G9389 Other specified disorders of brain: Secondary | ICD-10-CM | POA: Diagnosis not present

## 2024-06-07 DIAGNOSIS — Z982 Presence of cerebrospinal fluid drainage device: Secondary | ICD-10-CM | POA: Diagnosis not present

## 2024-06-07 LAB — GLUCOSE, CAPILLARY
Glucose-Capillary: 212 mg/dL — ABNORMAL HIGH (ref 70–99)
Glucose-Capillary: 180 mg/dL — ABNORMAL HIGH (ref 70–99)
Glucose-Capillary: 235 mg/dL — ABNORMAL HIGH (ref 70–99)

## 2024-06-07 MED ORDER — PANTOPRAZOLE SODIUM 40 MG PO TBEC: 40.0000 mg | DELAYED_RELEASE_TABLET | Freq: Every day | ORAL | Status: DC

## 2024-06-07 MED ORDER — BUTALBITAL-APAP-CAFFEINE 50-325-40 MG PO TABS: 1.0000 | ORAL_TABLET | Freq: Four times a day (QID) | ORAL | 0 refills | Status: DC | PRN

## 2024-06-07 MED ORDER — BUTALBITAL-APAP-CAFFEINE 50-325-40 MG PO TABS
ORAL_TABLET | ORAL | 0 refills | Status: AC
  Filled 2024-06-07: qty 14, 2d supply, fill #0

## 2024-06-07 MED ORDER — BACLOFEN 10 MG PO TABS
10.0000 mg | ORAL_TABLET | Freq: Three times a day (TID) | ORAL | 0 refills | Status: AC | PRN
  Filled 2024-06-07: qty 30, 10d supply, fill #0

## 2024-06-07 NOTE — Progress Notes (Signed)
 Discharge instructions, RX's and follow up appts provided to patient, reviewed by [facility] nurse.   Jyden Kromer, Cena Helling, RN

## 2024-06-07 NOTE — TOC Transition Note (Signed)
 Transition of Care [facility]) - Discharge Note   Patient Details  Name: Jeffrey [name] MRN: [id number] Date of Birth: 1955/12/08  Transition of [facility] CM/SW Contact:  Andrez JULIANNA George, RN Phone Number: 06/07/2024, 11:26 AM   Clinical Narrative:     Pt is discharging home with self care. No needs per IP Care management .   Final next level of care: Home/Self Care Barriers to Discharge: No Barriers Identified   Patient Goals and CMS Choice            Discharge Placement                       Discharge Plan and Services Additional resources added to the After Visit Summary for                                       Social Drivers of Health (SDOH) Interventions SDOH Screenings   Food Insecurity: No Food Insecurity (06/07/2024)  Housing: Low Risk  (06/07/2024)  Transportation Needs: No Transportation Needs (06/07/2024)  Utilities: Not At Risk (06/07/2024)  Depression (PHQ2-9): Low Risk  (05/23/2024)  Social Connections: Unknown (06/07/2024)  Tobacco Use: Low Risk  (06/06/2024)     Readmission Risk Interventions     No data to display

## 2024-06-07 NOTE — Plan of Care (Signed)
  Problem: Education: Goal: Knowledge of General Education information will improve Description: Including pain rating scale, medication(s)/side effects and non-pharmacologic comfort measures Outcome: Adequate for Discharge   Problem: Health Behavior/Discharge Planning: Goal: Ability to manage health-related needs will improve Outcome: Adequate for Discharge   Problem: Clinical Measurements: Goal: Ability to maintain clinical measurements within normal limits will improve Outcome: Adequate for Discharge Goal: Will remain free from infection Outcome: Adequate for Discharge Goal: Diagnostic test results will improve Outcome: Adequate for Discharge Goal: Respiratory complications will improve Outcome: Adequate for Discharge Goal: Cardiovascular complication will be avoided Outcome: Adequate for Discharge   Problem: Activity: Goal: Risk for activity intolerance will decrease Outcome: Adequate for Discharge   Problem: Nutrition: Goal: Adequate nutrition will be maintained Outcome: Adequate for Discharge   Problem: Coping: Goal: Level of anxiety will decrease Outcome: Adequate for Discharge   Problem: Elimination: Goal: Will not experience complications related to bowel motility Outcome: Adequate for Discharge Goal: Will not experience complications related to urinary retention Outcome: Adequate for Discharge   Problem: Pain Managment: Goal: General experience of comfort will improve and/or be controlled Outcome: Adequate for Discharge   Problem: Safety: Goal: Ability to remain free from injury will improve Outcome: Adequate for Discharge   Problem: Skin Integrity: Goal: Risk for impaired skin integrity will decrease Outcome: Adequate for Discharge   Problem: Education: Goal: Ability to describe self-care measures that may prevent or decrease complications (Diabetes Survival Skills Education) will improve Outcome: Adequate for Discharge Goal: Individualized Educational  Video(s) Outcome: Adequate for Discharge   Problem: Health Behavior/Discharge Planning: Goal: Ability to identify and utilize available resources and services will improve Outcome: Adequate for Discharge Goal: Ability to manage health-related needs will improve Outcome: Adequate for Discharge   Problem: Fluid Volume: Goal: Ability to maintain a balanced intake and output will improve Outcome: Adequate for Discharge   Problem: Metabolic: Goal: Ability to maintain appropriate glucose levels will improve Outcome: Adequate for Discharge   Problem: Nutritional: Goal: Maintenance of adequate nutrition will improve Outcome: Adequate for Discharge Goal: Progress toward achieving an optimal weight will improve Outcome: Adequate for Discharge   Problem: Skin Integrity: Goal: Risk for impaired skin integrity will decrease Outcome: Adequate for Discharge   Problem: Tissue Perfusion: Goal: Adequacy of tissue perfusion will improve Outcome: Adequate for Discharge   Problem: Tissue Perfusion: Goal: Adequacy of tissue perfusion will improve Outcome: Adequate for Discharge

## 2024-06-07 NOTE — Progress Notes (Signed)
 Patient's Brother called to report a episode of vomiting after returning home from the hospital.   They had just got into the house and Jeffrey [name] threw up. He wanted to know if this was a cause for concern.   I told him that I let Dr. Rosslyn know and advised them to keep in contact with us  about any more episodes.

## 2024-06-07 NOTE — Plan of Care (Signed)

## 2024-06-07 NOTE — Discharge Summary (Signed)
 Physician Discharge Summary  Patient ID: Jeffrey [name] MRN: [id number] DOB/AGE: November 04, 1955 68 y.o.  Admit date: 06/06/2024 Discharge date: 06/07/2024  Admission Diagnoses:  Discharge Diagnoses:  Principal Problem:   NPH (normal pressure hydrocephalus) [facility])   Discharged Condition: good  Hospital Course: Patient was admitted overnight for s/p right ventricular shunt placement on 10/27.  Patient has been stable with no new neurological events, vital stable.  Patient was deemed safe and appropriate to be discharged.  Patient instructed to keep incision dry and to follow-up with Dr. Janjua in clinic  Treatments: Sent home with prescriptions for Fioricet and baclofen  Discharge Exam: Blood pressure 128/66, pulse 74, temperature 97.8 F (36.6 C), temperature source Oral, resp. rate 18, height 5' 8 (1.727 m), weight 71.2 kg, SpO2 93%. General appearance: alert and cooperative Head: Normocephalic, without obvious abnormality Resp: clear to auscultation bilaterally Cardio: regular rate and rhythm, S1, S2 normal, no murmur, click, rub or gallop Extremities: extremities normal, atraumatic, no cyanosis or edema Neurologic: Alert and oriented X 3, normal strength and tone. Normal symmetric reflexes. Normal coordination and gait  Disposition: Discharge disposition: 01-Home or Self Care       Discharge Instructions     Call MD for:  redness, tenderness, or signs of infection (pain, swelling, redness, odor or green/yellow discharge around incision site)   Complete by: As directed    Call MD for:  severe uncontrolled pain   Complete by: As directed    Call MD for:  temperature >100.4   Complete by: As directed    Diet - low sodium heart healthy   Complete by: As directed    Discharge instructions   Complete by: As directed    Please follow up with Dr. Janjua as scheduled   Increase activity slowly   Complete by: As directed    No dressing needed   Complete by: As directed     Keep incisions dry      Allergies as of 06/07/2024       Reactions   Codeine Nausea And Vomiting   Farxiga  [dapagliflozin ]    Constipation   Gabapentin     Mood changes, depression    Losartan     Penicillins Rash   Did it involve swelling of the face/tongue/throat, SOB, or low BP? no Did it involve sudden or severe rash/hives, skin peeling, or any reaction on the inside of your mouth or nose? yes Did you need to seek medical attention at a hospital or doctor's office? yes When did it last happen?    childhood   If all above answers are "NO", may proceed with cephalosporin use.        Medication List     TAKE these medications    acetaminophen  325 MG tablet Commonly known as: TYLENOL  Take 325-650 mg by mouth every 6 (six) hours as needed for moderate pain.   aspirin  EC 81 MG tablet Take 81 mg by mouth in the morning.   atorvastatin  80 MG tablet Commonly known as: LIPITOR  TAKE 1 TABLET BY MOUTH EVERY DAY   baclofen 10 MG tablet Commonly known as: LIORESAL Take 1 tablet (10 mg total) by mouth 3 (three) times daily as needed for muscle spasms.   blood glucose meter kit and supplies Dispense based on patient and insurance preference. Use up to four times daily as directed. (FOR ICD-10 E10.9, E11.9).   butalbital-acetaminophen -caffeine 50-325-40 MG tablet Commonly known as: FIORICET Take 1-2 tablets by mouth every 6 (six) hours as needed for headaches (  moderate pain).   carvedilol  3.125 MG tablet Commonly known as: COREG  TAKE 1 TABLET BY MOUTH 2 TIMES DAILY WITH MEAL   cyanocobalamin  1000 MCG tablet Commonly known as: VITAMIN B12 Take 1,000 mcg by mouth in the morning.   furosemide  40 MG tablet Commonly known as: LASIX  TAKE 1 TABLET BY MOUTH EVERY DAY   insulin  aspart 100 UNIT/ML injection Commonly known as: NovoLOG  USE 3X DAILY AS NEEDED FOR HIGH BLOOD SUGARS. 3 UNITS FOR SUGAR >200, 6 UNITS >300, 9 UNITS 400+ What changed:  how much to take how to take  this when to take this   Insulin  Syringe-Needle U-100 31G X 5/16 0.3 ML Misc Commonly known as: B-D INS SYR HALF-UNIT .3CC/31G Use 6 times a day   Lantus  SoloStar 100 UNIT/ML Solostar Pen Generic drug: insulin  glargine INJECT 34-50 UNITS INTO THE SKIN DAILY. What changed:  how much to take when to take this   OneTouch Delica Lancets 33G Misc Use to check blood sugar 4 times per day   OneTouch Verio IQ System w/Device Kit 1 kit by Does not apply route 4 (four) times daily.   OneTouch Verio test strip Generic drug: glucose blood USE TO CHECK BLOOD SUGAR 4 TIMES A DAY   pantoprazole  40 MG tablet Commonly known as: PROTONIX  TAKE 1 TABLET BY MOUTH EVERY DAY   Pen Needles 33G X 4 MM Misc 1 application  by Does not apply route in the morning, at noon, in the evening, and at bedtime.   Refresh Tears 0.5 % Soln Generic drug: carboxymethylcellulose Place 1 drop into both eyes 3 (three) times daily as needed (dry/irritated eyes).   spironolactone  25 MG tablet Commonly known as: ALDACTONE  Take 0.5 tablets (12.5 mg total) by mouth in the morning.   triamcinolone  cream 0.1 % Commonly known as: KENALOG  Apply 1 application topically 2 (two) times daily. What changed:  when to take this reasons to take this   Vitamin D -3 125 MCG (5000 UT) Tabs Take 5,000 Units by mouth in the morning.               Discharge Care Instructions  (From admission, onward)           Start     Ordered   06/07/24 0000  No dressing needed       Comments: Keep incisions dry   06/07/24 1123             Signed: Karna DELENA Geralds 06/07/2024, 12:49 PM

## 2024-06-08 NOTE — Telephone Encounter (Signed)
 Called patient's brother to notify them of to contact us  if they have neuro changes with nausea and vomiting.

## 2024-06-09 ENCOUNTER — Encounter: Admitting: Physical Therapy

## 2024-06-09 ENCOUNTER — Telehealth: Payer: Self-pay

## 2024-06-09 NOTE — Telephone Encounter (Signed)
 Patient's brother called to report post-surgical constipation.  He is reporting that he is passing gas. No abdominal hardness.  I advised them to get some Magnesium  Citrate over the counter, encourage fluids, and to encourage mobility as much as possible.

## 2024-06-14 ENCOUNTER — Telehealth: Payer: Self-pay

## 2024-06-14 NOTE — Telephone Encounter (Signed)
 Patient's brother called to see if they could review imaging with Doctor Rosslyn of a before and after the shunt insertion.   I told them that I was not sure if what they want to see would show up on the imaging but they could discuss this with Dr. Janjua at his appointment.

## 2024-06-17 ENCOUNTER — Telehealth: Payer: Self-pay

## 2024-06-17 ENCOUNTER — Encounter: Admitting: Neurosurgery

## 2024-06-17 ENCOUNTER — Encounter: Payer: Self-pay | Admitting: Neurosurgery

## 2024-06-17 ENCOUNTER — Ambulatory Visit (INDEPENDENT_AMBULATORY_CARE_PROVIDER_SITE_OTHER): Admitting: Neurosurgery

## 2024-06-17 VITALS — BP 137/81 | HR 70 | Temp 97.9°F | Ht 67.0 in | Wt 165.0 lb

## 2024-06-17 DIAGNOSIS — G912 (Idiopathic) normal pressure hydrocephalus: Secondary | ICD-10-CM

## 2024-06-17 DIAGNOSIS — Z4889 Encounter for other specified surgical aftercare: Secondary | ICD-10-CM

## 2024-06-17 DIAGNOSIS — Z982 Presence of cerebrospinal fluid drainage device: Secondary | ICD-10-CM

## 2024-06-17 NOTE — Progress Notes (Signed)
 67 year old gentleman with a past medical history significant for normal pressure hydrocephalus.  He underwent placement of a right-sided ventriculoperitoneal shunt programmed at 150.  He returns today with his brother and states that he is doing much better: He is able to walk better, get in and out of bed better, and get in and out of his chair is better.  Cognitively he feels like he has far more energy than he used to.  I am really happy to hear how well he is doing.  We are going to remove his staples today and I will see him back in a few weeks.  I explained to him that we will continue to monitor his progress and if there is a lack of progression, we can always dial  the valve down.

## 2024-06-17 NOTE — Telephone Encounter (Addendum)
 Patient 's brother called to ask if his brother can get the Covid vaccine or if they should wait a certain period of time.   He mentions that they are going to the dentist soon and wanted to know if there were any contraindications this soon after surgery.   I will reach out to Dr. Janjua and call them when I have an answer.

## 2024-06-17 NOTE — Progress Notes (Signed)
 Patient tolerated staple removal well. CMA Avelina Farr and I performed staple removal.   Skin was prepped with Betadine before removal.   Abdominal and head incisions are well healing with no evidence of erythemia or drainage. They are well approximated other than an approximately 2cm section where bottom layer of skin had fused but top layers needed to be pulled together to promote optimal wound healing.    He experienced tenderness in the abdominal region when staples were removed. A steri strip was placed over one section of the incision to promote closure of upper section of incision. Head staple removal was more comfortable for the patient.   Patient educated that they can leave the steri-strip on until it falls off and to avoid pulling off.   Patient understands education regarding showering but avoiding scrubbing of incisions to mediate infection.   I advised patient and his brother to call if they have any questions.   Tinnie Anasia Agro RN-BSN [facility]  Neurosurgery  Clinic RN

## 2024-06-20 ENCOUNTER — Other Ambulatory Visit: Payer: Self-pay

## 2024-06-20 DIAGNOSIS — G912 (Idiopathic) normal pressure hydrocephalus: Secondary | ICD-10-CM

## 2024-06-21 NOTE — Addendum Note (Signed)
 Addended by: Kasch Borquez on: 06/21/2024 09:51 AM   Modules accepted: Orders

## 2024-06-22 NOTE — Therapy (Signed)
OUTPATIENT PHYSICAL THERAPY LOWER EXTREMITY EVALUATION   Patient Name: Jeffrey [name] MRN: [id number] DOB:Dec 18, 1955, 69 y.o., male Today's Date: 06/23/2024  END OF SESSION:  PT End of Session - 06/23/24 1709     Visit Number 1    Date for Recertification  08/18/24    Authorization Type HTA    PT Start Time 1446    PT Stop Time 1526    PT Time Calculation (min) 40 min    Activity Tolerance Patient tolerated treatment well    Behavior During Therapy [facility] for tasks assessed/performed          Past Medical History:  Diagnosis Date   Cardiac arrest ([name]) 05/22/2016   Cataract    CHF (congestive heart failure) ([name])    CKD (chronic kidney disease) stage 3, GFR 30-59 ml/min ([name]) 05/28/2021   CKD (chronic kidney disease) stage 4, GFR 15-29 ml/min ([name]) 05/28/2021   Coronary artery disease    LAD PCI   Diabetes mellitus without complication ([name])    type 2   Diabetic retinopathy ([name])    Hyperlipidemia    Hypertension    Memory loss    mild   Myocardial infarct ([name]) 2105   Retinopathy    Both eyes   S/P primary angioplasty with coronary stent 05/22/2016   Sleep-disordered breathing 12/31/2023   Vitamin B12 deficiency    Vitreous hemorrhage of left eye ([name])    proliferative diabetic retinopathy   Past Surgical History:  Procedure Laterality Date   ANGIOPLASTY     2015   COLONOSCOPY     multiple   EYE SURGERY     PARS PLANA VITRECTOMY Left 03/15/2020   Procedure: PARS PLANA VITRECTOMY WITH 25 GAUGE;  Surgeon: Jarold Mayo, MD;  Location: [facility] OR;  Service: Ophthalmology;  Laterality: Left;   PHOTOCOAGULATION WITH LASER Left 03/15/2020   Procedure: PHOTOCOAGULATION WITH LASER; INTRAVITREAL INJECTION OF AVASTIN ;  Surgeon: Jarold Mayo, MD;  Location: [facility] OR;  Service: Ophthalmology;  Laterality: Left;   REFRACTIVE SURGERY     UPPER GI ENDOSCOPY     several   VENTRICULOPERITONEAL SHUNT Right 06/06/2024   Procedure: SHUNT INSERTION VENTRICULAR-PERITONEAL;  Surgeon:  Rosslyn Dino HERO, MD;  Location: [facility] OR;  Service: Neurosurgery;  Laterality: Right;   VITRECTOMY     Patient Active Problem List   Diagnosis Date Noted   NPH (normal pressure hydrocephalus) ([name]) 05/23/2024   Sleep-disordered breathing 12/31/2023   Actinic keratosis 11/30/2023   Vitamin D  deficiency 11/30/2023   Rhinitis 05/20/2023   Cervical spondylosis 08/26/2022   CKD (chronic kidney disease) stage 4, GFR 15-29 ml/min ([name]) 05/28/2021   Adjustment disorder 01/22/2021   Chronic back pain 10/22/2020   Peripheral vertigo 05/15/2020   Dermatitis 01/04/2020   Unintentional weight loss with loose stools 05/19/2019   Low vitamin B12 level 03/29/2019   Heme positive stool 03/14/2019   Normocytic anemia 03/14/2019   Chronic diastolic heart failure ([name]) 02/08/2019   Diabetic retinopathy ([name]) 09/08/2018   Physical debility 05/24/2018   Varicose veins of both lower extremities 03/01/2018   Fatigue due to exertion 10/14/2017   GERD (gastroesophageal reflux disease) 08/25/2017   CAD in native artery 04/14/2017   Hyperlipidemia associated with type 2 diabetes mellitus ([name]) 04/14/2017   Diabetic peripheral neuropathy associated with type 2 diabetes mellitus ([name]) 08/17/2016   [name] 05/02/2016   Hypertension associated with diabetes ([name]) 05/02/2016    PCP: Kennyth Worth HERO, MD   REFERRING PROVIDER: Rosslyn Dino HERO,  MD  REFERRING DIAG: G91.2 (ICD-10-CM) - NPH (normal pressure hydrocephalus) ([name])  THERAPY DIAG:  Muscle weakness (generalized) - Plan: PT plan of care cert/re-cert  Other abnormalities of gait and mobility - Plan: PT plan of care cert/re-cert  Difficulty in walking, not elsewhere classified - Plan: PT plan of care cert/re-cert  Rationale for Evaluation and Treatment: Rehabilitation  ONSET DATE: 06/06/2024  SUBJECTIVE:   SUBJECTIVE STATEMENT: Patients presents after having a entriculoperitoneal shunt placements. His brother  reports it is easier for him to get out of bed, he walking is smoother, and . Yesterday he showered independently with his shower chair. He negotiated the ledge to get out of the shower independently and with confidence. He presents today ambulating with his walker. He also has had improvements in his memory.  If his improvements plateau/ get stagnant surgeon will let more fluid out of his ventricles. This has to be done in increments.   From 11/07 MD Note: 68 year old gentleman with a past medical history significant for normal pressure hydrocephalus.  He underwent placement of a right-sided ventriculoperitoneal shunt programmed at 150. He returns today with his brother and states that he is doing much better: He is able to walk better, get in and out of bed better, and get in and out of his chair is better.  Cognitively he feels like he has far more energy than he used to.      PERTINENT HISTORY: evidence of lumbar compression deformity Compression fx L1 2022, CAD, CKD (stage 4), DM, cardiac arrest 2017, neuropathy, retinopathy Long history of gait abnormality beginning back in 2022. He has a history of CAD and believes that his debility began following a covid infection. In December odf 2022 he fell and suffered a compression fracture of L1.  PAIN:  Are you having pain? No and He has tubing in his abdomen that is sometimes uncomfortable. They move around which is normal.  PRECAUTIONS: Other: 5lb lifting restriction until cleared by surgeon   RED FLAGS: None   WEIGHT BEARING RESTRICTIONS: No  FALLS:  Has patient fallen in last 6 months? No  LIVING ENVIRONMENT: Lives with: lives with their family Lives in: House/apartment Stairs: No Has following equipment at home: Single point cane, Environmental Consultant - 2 wheeled, Environmental Consultant - 4 wheeled, shower chair, Grab bars, and and stand up walker also; also has shower chair at sink for shaving, brushing teeth etc, also has transporter.  OCCUPATION:  Retired  PLOF: Independent with household mobility with device, Requires assistive device for independence, Needs assistance with ADLs, and Brother assists with dressing; patient showers independently with shower chair  PATIENT GOALS: To be more independent; to not us  the walker  NEXT MD VISIT: July 12, 2024  OBJECTIVE:  Note: Objective measures were completed at Evaluation unless otherwise noted.  DIAGNOSTIC FINDINGS: None  PATIENT SURVEYS:  ABC scale: The Activities-Specific Balance Confidence (ABC) Scale 0% 10 20 30  40 50 60 70 80 90 100% No confidence<->completely confident  "How confident are you that you will not lose your balance or become unsteady when you . . .   Date tested 06/23/2024  Walk around the house 80%  2. Walk up or down stairs 0%  3. Bend over and pick up a slipper from in front of a closet floor 0%  4. Reach for a small can off a shelf at eye level 0%  5. Stand on tip toes and reach for something above your head 0%  6. Stand on a chair and  reach for something 0%  7. Sweep the floor 0%  8. Walk outside the house to a car parked in the driveway 50%  9. Get into or out of a car 80%  10. Walk across a parking lot to the mall 20%  11. Walk up or down a ramp 30%  12. Walk in a crowded mall where people rapidly walk past you 80%  13. Are bumped into by people as you walk through the mall 30%  14. Step onto or off of an escalator while you are holding onto the railing 0%  15. Step onto or off an escalator while holding onto parcels such that you cannot hold onto the railing 0%  16. Walk outside on icy sidewalks 0%  Total: 370/16 23.12%     COGNITION: Overall cognitive status: History of cognitive impairments - at baseline     SENSATION: Neuropathy    POSTURE: rounded shoulders and forward head   LOWER EXTREMITY ROM:WFL for tasks assessed    LOWER EXTREMITY MMT:  MMT Right eval Left eval  Hip flexion    Hip extension    Hip abduction    Hip  adduction    Hip internal rotation    Hip external rotation    Knee flexion    Knee extension    Ankle dorsiflexion    Ankle plantarflexion    Ankle inversion    Ankle eversion     (Blank rows = not tested)    FUNCTIONAL TESTS:  5 times sit to stand: 20.81 sec with UE support Timed up and go (TUG): 23.05 sec with rollator 3 minute walk test: 135ft with rollator; needed a seated rest break after 2:15 sec  GAIT: Distance walked: 137ft Assistive device utilized: Environmental Consultant - 4 wheeled Level of assistance: Modified independence Comments: decreased cadence                                                                                                                                 TREATMENT DATE:  06/23/2024 Initial Evaluation & HEP created    PATIENT EDUCATION:  Education details: PT eval findings, anticipated POC, progress with PT, and initial HEP  Person educated: Patient Education method: Explanation, Demonstration, and Handouts Education comprehension: verbalized understanding, returned demonstration, and needs further education  HOME EXERCISE PROGRAM: Access Code: [email or web address] URL: [email or web address] Date: 06/23/2024 Prepared by: Kristeen Sar  Exercises - Seated March  - 1 x daily - 7 x weekly - 1-2 sets - 10 reps - Seated Long Arc Quad  - 1 x daily - 7 x weekly - 1-2 sets - 10 reps - Heel Raises with Counter Support  - 1 x daily - 7 x weekly - 1-2 sets - 10 reps - Standing March with Counter Support  - 1 x daily - 7 x weekly - 1-2 sets - 10 reps - Standing Hip Abduction with Counter Support  - 1 x daily - 7 x  weekly - 1-2 sets - 10 reps - Sit to Stand with Armchair  - 1 x daily - 7 x weekly - 1-2 sets - 5 reps  ASSESSMENT:  CLINICAL IMPRESSION: Patient is a 68 y.o. male who was seen today for physical therapy evaluation and treatment for normal pressure hydrocephalus. Rudolph is well known in this clinic from previous plan of care. On 10/27 he underwent  surgery to place a ventriculoperitoneal shunt. His brother, [name], was present to discuss improvements noted since having the shunt place. His brother verbalized improvements in his bed mobility, posture, balance, and walking ability. We was able to walk for 2 minutes with his rollator today which is much improved compared to his previous standard of care. Based on evaluation noted muscle weakness, poor endurance, and increased fall risk. Patient is highly motivated and wants to maintain independence. Patient will benefit from skilled PT to address the below impairments and improve overall function.   OBJECTIVE IMPAIRMENTS: Abnormal gait, decreased activity tolerance, decreased balance, decreased endurance, decreased mobility, difficulty walking, decreased ROM, decreased strength, increased muscle spasms, impaired flexibility, postural dysfunction, and pain.   ACTIVITY LIMITATIONS: carrying, lifting, bending, standing, squatting, transfers, bed mobility, bathing, toileting, dressing, reach over head, hygiene/grooming, and locomotion level  PARTICIPATION LIMITATIONS: cleaning, laundry, shopping, and community activity  PERSONAL FACTORS: Age, Fitness, Time since onset of injury/illness/exacerbation, and 3+ comorbidities: Compression fx L1 2022, CAD, CKD (stage 4), neuropathy, retinopathy are also affecting patient's functional outcome.   REHAB POTENTIAL: Good  CLINICAL DECISION MAKING: Evolving/moderate complexity  EVALUATION COMPLEXITY: High   GOALS: Goals reviewed with patient? Yes  SHORT TERM GOALS: Target date: 07/21/2024 Patient will be independent with initial HEP. Baseline:  Goal status: INITIAL  2.  Patient will be able to participate in a without needed a seated rest break due to improved cardiovascular endurance. Baseline:  Goal status: INITIAL    LONG TERM GOALS: Target date: 08/18/2024 Patient will demonstrate independence in advanced HEP. Baseline:  Goal status:  INITIAL  2.  Patient will be able to participate in a 6 MWT to improve community negotiation and functional mobility. Baseline:  Goal status: INITIAL  3.  Patient will be able to stand to brush his teeth and wash his face due to improved standing tolerance. Baseline:  Goal status: INITIAL   4.  Patient will perform 5STS in < or = to 16 sec due to improved LE strength and functional mobility. Baseline: 20.81 sec Goal status: INITIAL  5.  Patient will perform TUG in < or = to 18sec to decrease falls risk. Baseline: 23.05 Goal status: INITIAL  6.  Patient will demonstrate improved LE mobility needed for greater ease with putting on socks and shoes in sitting . Baseline:  Goal status: INITIAL   PLAN:  PT FREQUENCY: 2x/week  PT DURATION: 8 weeks  PLANNED INTERVENTIONS: 97164- PT Re-evaluation, 97110-Therapeutic exercises, 97530- Therapeutic activity, 97112- Neuromuscular re-education, 97535- Self Care, [id number]- Manual therapy, [phone]- Gait training, [phone]- Canalith repositioning, [id number]- Aquatic Therapy, [phone]- Electrical stimulation (unattended), [phone]- Electrical stimulation (manual), [id number]- Vasopneumatic device, [id number]- Ultrasound, 79439 (1-2 muscles), 20561 (3+ muscles)- Dry Needling, Patient/Family education, Balance training, Stair training, Taping, Joint mobilization, Joint manipulation, Spinal manipulation, Spinal mobilization, Vestibular training, Cryotherapy, and Moist heat  PLAN FOR NEXT SESSION: 5lb lifting restriction until cleared by surgeon; standing/ walking tolerance; assess strength; balance   Kristeen Sar, PT, DPT 06/23/24 5:12 PM [facility] [address] [place], [place] [address] Phone # [phone] Fax  [phone]   

## 2024-06-23 ENCOUNTER — Ambulatory Visit: Attending: Neurosurgery | Admitting: Physical Therapy

## 2024-06-23 ENCOUNTER — Other Ambulatory Visit: Payer: Self-pay

## 2024-06-23 ENCOUNTER — Encounter: Payer: Self-pay | Admitting: Physical Therapy

## 2024-06-23 DIAGNOSIS — R2689 Other abnormalities of gait and mobility: Secondary | ICD-10-CM | POA: Insufficient documentation

## 2024-06-23 DIAGNOSIS — G912 (Idiopathic) normal pressure hydrocephalus: Secondary | ICD-10-CM | POA: Diagnosis not present

## 2024-06-23 DIAGNOSIS — R262 Difficulty in walking, not elsewhere classified: Secondary | ICD-10-CM | POA: Diagnosis not present

## 2024-06-23 DIAGNOSIS — M6281 Muscle weakness (generalized): Secondary | ICD-10-CM | POA: Insufficient documentation

## 2024-06-27 ENCOUNTER — Other Ambulatory Visit: Payer: Self-pay

## 2024-06-27 ENCOUNTER — Telehealth: Payer: Self-pay

## 2024-06-27 NOTE — Telephone Encounter (Signed)
 Patient's brother called and asked for his brother to be seen tomorrow. I told them that I could have them come in so that I could look at the surgical wound on the abdomen that they are concerned about and have Dr. Janjua take a look at it for further directions.    The medial side of the surgical wound on the abdomen is fused together but not as well as the lateral side. The medial side of the incision a steri-strip was placed to promote fusion of this area.   Plan is to inspect area then have Dr. Rosslyn address patient concerns regarding site.

## 2024-06-28 ENCOUNTER — Ambulatory Visit

## 2024-06-28 VITALS — BP 139/83 | HR 78 | Temp 97.7°F | Ht 67.0 in | Wt 165.0 lb

## 2024-06-28 DIAGNOSIS — Z5189 Encounter for other specified aftercare: Secondary | ICD-10-CM

## 2024-06-28 DIAGNOSIS — Z4889 Encounter for other specified surgical aftercare: Secondary | ICD-10-CM

## 2024-06-28 NOTE — Progress Notes (Addendum)
 Patient's brother called yesterday to report issue with wound healing of abdominal incision. He stated that one side of the incision was not closed all the way.   I added them on for clinic as a nurse visit to assess the wound and report any unexpected findings to Dr. Janjua.   Wound appears well healing with no erythrema, drainage or open sections.   On the outside lateral section of the abdominal incision the wound still had some scabbing. I explained to them that this area is taking longer to heal but it is not unexpected for where he is at his surgery recovery.   Incision is well approximated despite area of scabbing, there is a small amount of swelling around this area that I explained would get better with time.    They know to call if there is any erythrema, drainage, or the patient develops a fever post operatively.   They will see Dr. Janjua on 07/12/2024  for routine post-op as scheduled.   Tinnie Abdi Husak RN-BSN [name]  Neurosurgery  Clinic RN

## 2024-07-02 ENCOUNTER — Other Ambulatory Visit: Payer: Self-pay | Admitting: Family Medicine

## 2024-07-05 ENCOUNTER — Ambulatory Visit: Admitting: Physical Therapy

## 2024-07-05 ENCOUNTER — Encounter: Payer: Self-pay | Admitting: Physical Therapy

## 2024-07-05 DIAGNOSIS — R262 Difficulty in walking, not elsewhere classified: Secondary | ICD-10-CM

## 2024-07-05 DIAGNOSIS — M6281 Muscle weakness (generalized): Secondary | ICD-10-CM

## 2024-07-05 DIAGNOSIS — R2689 Other abnormalities of gait and mobility: Secondary | ICD-10-CM

## 2024-07-05 NOTE — Therapy (Signed)
 OUTPATIENT PHYSICAL THERAPY LOWER EXTREMITY PROGRESS NOTE   Patient Name: Jeffrey [name] MRN: [id number] DOB:10/11/1955, 68 y.o., male Today's Date: 07/05/2024  END OF SESSION:  PT End of Session - 07/05/24 1525     Visit Number 2    Date for Recertification  08/18/24    Authorization Type HTA    PT Start Time 1530    PT Stop Time 1615    PT Time Calculation (min) 45 min    Equipment Utilized During Treatment Gait belt          Past Medical History:  Diagnosis Date   Cardiac arrest ([name]) 05/22/2016   Cataract    CHF (congestive heart failure) ([name])    CKD (chronic kidney disease) stage 3, GFR 30-59 ml/min ([name]) 05/28/2021   CKD (chronic kidney disease) stage 4, GFR 15-29 ml/min ([name]) 05/28/2021   Coronary artery disease    LAD PCI   Diabetes mellitus without complication ([name])    type 2   Diabetic retinopathy ([name])    Hyperlipidemia    Hypertension    Memory loss    mild   Myocardial infarct ([name]) 2105   Retinopathy    Both eyes   S/P primary angioplasty with coronary stent 05/22/2016   Sleep-disordered breathing 12/31/2023   Vitamin B12 deficiency    Vitreous hemorrhage of left eye ([name])    proliferative diabetic retinopathy   Past Surgical History:  Procedure Laterality Date   ANGIOPLASTY     2015   COLONOSCOPY     multiple   EYE SURGERY     PARS PLANA VITRECTOMY Left 03/15/2020   Procedure: PARS PLANA VITRECTOMY WITH 25 GAUGE;  Surgeon: Jarold Mayo, MD;  Location: [facility] OR;  Service: Ophthalmology;  Laterality: Left;   PHOTOCOAGULATION WITH LASER Left 03/15/2020   Procedure: PHOTOCOAGULATION WITH LASER; INTRAVITREAL INJECTION OF AVASTIN ;  Surgeon: Jarold Mayo, MD;  Location: [facility] OR;  Service: Ophthalmology;  Laterality: Left;   REFRACTIVE SURGERY     UPPER GI ENDOSCOPY     several   VENTRICULOPERITONEAL SHUNT Right 06/06/2024   Procedure: SHUNT INSERTION VENTRICULAR-PERITONEAL;  Surgeon: Rosslyn Dino HERO, MD;  Location: [facility] OR;  Service: Neurosurgery;   Laterality: Right;   VITRECTOMY     Patient Active Problem List   Diagnosis Date Noted   NPH (normal pressure hydrocephalus) ([name]) 05/23/2024   Sleep-disordered breathing 12/31/2023   Actinic keratosis 11/30/2023   Vitamin D  deficiency 11/30/2023   Rhinitis 05/20/2023   Cervical spondylosis 08/26/2022   CKD (chronic kidney disease) stage 4, GFR 15-29 ml/min ([name]) 05/28/2021   Adjustment disorder 01/22/2021   Chronic back pain 10/22/2020   Peripheral vertigo 05/15/2020   Dermatitis 01/04/2020   Unintentional weight loss with loose stools 05/19/2019   Low vitamin B12 level 03/29/2019   Heme positive stool 03/14/2019   Normocytic anemia 03/14/2019   Chronic diastolic heart failure ([name]) 02/08/2019   Diabetic retinopathy ([name]) 09/08/2018   Physical debility 05/24/2018   Varicose veins of both lower extremities 03/01/2018   Fatigue due to exertion 10/14/2017   GERD (gastroesophageal reflux disease) 08/25/2017   CAD in native artery 04/14/2017   Hyperlipidemia associated with type 2 diabetes mellitus ([name]) 04/14/2017   Diabetic peripheral neuropathy associated with type 2 diabetes mellitus ([name]) 08/17/2016   [name] 05/02/2016   Hypertension associated with diabetes ([name]) 05/02/2016    PCP: Kennyth Worth HERO, MD   REFERRING PROVIDER: Rosslyn Dino HERO, MD  REFERRING DIAG: G91.2 (ICD-10-CM) - NPH (normal  pressure hydrocephalus) ([name])  THERAPY DIAG:  Muscle weakness (generalized)  Other abnormalities of gait and mobility  Difficulty in walking, not elsewhere classified  Rationale for Evaluation and Treatment: Rehabilitation  ONSET DATE: 06/06/2024  SUBJECTIVE:   SUBJECTIVE STATEMENT: Today I'm not as good.  Jeffrey [name] reports Jeffrey [name] doesn't need help getting the leg in/out of bed.  Sore where the stitches were and I can feel that tub.  Twice walked from the bedroom to sunroom with no device.  Some days better than others.     Eval: Patients  presents after having a entriculoperitoneal shunt placements. His brother reports it is easier for him to get out of bed, he walking is smoother, and . Yesterday he showered independently with his shower chair. He negotiated the ledge to get out of the shower independently and with confidence. He presents today ambulating with his walker. He also has had improvements in his memory.  If his improvements plateau/ get stagnant surgeon will let more fluid out of his ventricles. This has to be done in increments.   From 11/07 MD Note: 68 year old gentleman with a past medical history significant for normal pressure hydrocephalus.  He underwent placement of a right-sided ventriculoperitoneal shunt programmed at 150. He returns today with his brother and states that he is doing much better: He is able to walk better, get in and out of bed better, and get in and out of his chair is better.  Cognitively he feels like he has far more energy than he used to.      PERTINENT HISTORY: evidence of lumbar compression deformity Compression fx L1 2022, CAD, CKD (stage 4), DM, cardiac arrest 2017, neuropathy, retinopathy Long history of gait abnormality beginning back in 2022. He has a history of CAD and believes that his debility began following a covid infection. In December odf 2022 he fell and suffered a compression fracture of L1.  PAIN:  Are you having pain? No and He has tubing in his abdomen that is sometimes uncomfortable. They move around which is normal.  PRECAUTIONS: Other: 5lb lifting restriction until cleared by surgeon   RED FLAGS: None   WEIGHT BEARING RESTRICTIONS: No  FALLS:  Has patient fallen in last 6 months? No  LIVING ENVIRONMENT: Lives with: lives with their family Lives in: House/apartment Stairs: No Has following equipment at home: Single point cane, Environmental Consultant - 2 wheeled, Environmental Consultant - 4 wheeled, shower chair, Grab bars, and and stand up walker also; also has shower chair at sink for  shaving, brushing teeth etc, also has transporter.  OCCUPATION: Retired  PLOF: Independent with household mobility with device, Requires assistive device for independence, Needs assistance with ADLs, and Brother assists with dressing; patient showers independently with shower chair  PATIENT GOALS: To be more independent; to not us  the walker  NEXT MD VISIT: July 12, 2024  OBJECTIVE:  Note: Objective measures were completed at Evaluation unless otherwise noted.  DIAGNOSTIC FINDINGS: None  PATIENT SURVEYS:  ABC scale: The Activities-Specific Balance Confidence (ABC) Scale 0% 10 20 30  40 50 60 70 80 90 100% No confidence<->completely confident  "How confident are you that you will not lose your balance or become unsteady when you . . .   Date tested 06/23/2024  Walk around the house 80%  2. Walk up or down stairs 0%  3. Bend over and pick up a slipper from in front of a closet floor 0%  4. Reach for a small can off a shelf at eye level  0%  5. Stand on tip toes and reach for something above your head 0%  6. Stand on a chair and reach for something 0%  7. Sweep the floor 0%  8. Walk outside the house to a car parked in the driveway 50%  9. Get into or out of a car 80%  10. Walk across a parking lot to the mall 20%  11. Walk up or down a ramp 30%  12. Walk in a crowded mall where people rapidly walk past you 80%  13. Are bumped into by people as you walk through the mall 30%  14. Step onto or off of an escalator while you are holding onto the railing 0%  15. Step onto or off an escalator while holding onto parcels such that you cannot hold onto the railing 0%  16. Walk outside on icy sidewalks 0%  Total: 370/16 23.12%     COGNITION: Overall cognitive status: History of cognitive impairments - at baseline     SENSATION: Neuropathy    POSTURE: rounded shoulders and forward head   LOWER EXTREMITY ROM:WFL for tasks assessed    LOWER EXTREMITY MMT:  MMT Right eval  Left eval  Hip flexion    Hip extension    Hip abduction    Hip adduction    Hip internal rotation    Hip external rotation    Knee flexion    Knee extension    Ankle dorsiflexion    Ankle plantarflexion    Ankle inversion    Ankle eversion     (Blank rows = not tested)    FUNCTIONAL TESTS:  5 times sit to stand: 20.81 sec with UE support Timed up and go (TUG): 23.05 sec with rollator 3 minute walk test: 158ft with rollator; needed a seated rest break after 2:15 sec  GAIT: Distance walked: 167ft Assistive device utilized: Environmental Consultant - 4 wheeled Level of assistance: Modified independence Comments: decreased cadence                                                                                                                                 TREATMENT DATE:  11/25: Nu-Step seat 9, arms 10 L 5 while discussing status and response to treatment Gait with RW 60 feet with upright posture and good toe clearance In // bars: gait without UE assist with turns without UE assist In // bars: stepping over canes/dowels on floor (light UE assist needed) In // bars: 4 inch step ups 7x bil UE assist needed Sit to stand 5x with min to mod UE assist needed Sit to stand from chair + foam pad NO UE assist needed 5x Gait without assistive device with close CGA 40 feet (pt's brother followed with RW for safety) RPE (Rating of Perceived Exertion):    7 /10    06/23/2024 Initial Evaluation & HEP created    PATIENT EDUCATION:  Education details: PT eval findings, anticipated POC,  progress with PT, and initial HEP  Person educated: Patient Education method: Explanation, Demonstration, and Handouts Education comprehension: verbalized understanding, returned demonstration, and needs further education  HOME EXERCISE PROGRAM: Access Code: [email or web address] URL: [email or web address] Date: 06/23/2024 Prepared by: Kristeen Sar  Exercises - Seated March  - 1 x daily - 7 x weekly - 1-2 sets  - 10 reps - Seated Long Arc Quad  - 1 x daily - 7 x weekly - 1-2 sets - 10 reps - Heel Raises with Counter Support  - 1 x daily - 7 x weekly - 1-2 sets - 10 reps - Standing March with Counter Support  - 1 x daily - 7 x weekly - 1-2 sets - 10 reps - Standing Hip Abduction with Counter Support  - 1 x daily - 7 x weekly - 1-2 sets - 10 reps - Sit to Stand with Armchair  - 1 x daily - 7 x weekly - 1-2 sets - 5 reps  ASSESSMENT:  CLINICAL IMPRESSION: Much improved speed of gait, toe and foot clearance and general mobility compared to pre-procedure status.  He is able to pick his feet up higher to negotiate low obstacles and within the parallel bars he is able to make turns without holding the bars.  Much less UE reliance overall with standing ex's. Also of note, is improved LE strength with rising sit to stand.  From a slightly higher chair height he is able to rise sit to stand without UE assist.  Therapist monitoring response to all interventions and modifying treatment accordingly as well as providing close supervision and CGA for safety.    Eval;Patient is a 68 y.o. male who was seen today for physical therapy evaluation and treatment for normal pressure hydrocephalus. Endy is well known in this clinic from previous plan of care. On 10/27 he underwent surgery to place a ventriculoperitoneal shunt. His brother, Jeffrey [name], was present to discuss improvements noted since having the shunt place. His brother verbalized improvements in his bed mobility, posture, balance, and walking ability. We was able to walk for 2 minutes with his rollator today which is much improved compared to his previous standard of care. Based on evaluation noted muscle weakness, poor endurance, and increased fall risk. Patient is highly motivated and wants to maintain independence. Patient will benefit from skilled PT to address the below impairments and improve overall function.   OBJECTIVE IMPAIRMENTS: Abnormal gait, decreased  activity tolerance, decreased balance, decreased endurance, decreased mobility, difficulty walking, decreased ROM, decreased strength, increased muscle spasms, impaired flexibility, postural dysfunction, and pain.   ACTIVITY LIMITATIONS: carrying, lifting, bending, standing, squatting, transfers, bed mobility, bathing, toileting, dressing, reach over head, hygiene/grooming, and locomotion level  PARTICIPATION LIMITATIONS: cleaning, laundry, shopping, and community activity  PERSONAL FACTORS: Age, Fitness, Time since onset of injury/illness/exacerbation, and 3+ comorbidities: Compression fx L1 2022, CAD, CKD (stage 4), neuropathy, retinopathy are also affecting patient's functional outcome.   REHAB POTENTIAL: Good  CLINICAL DECISION MAKING: Evolving/moderate complexity  EVALUATION COMPLEXITY: High   GOALS: Goals reviewed with patient? Yes  SHORT TERM GOALS: Target date: 07/21/2024 Patient will be independent with initial HEP. Baseline:  Goal status: INITIAL  2.  Patient will be able to participate in a without needed a seated rest break due to improved cardiovascular endurance. Baseline:  Goal status: INITIAL    LONG TERM GOALS: Target date: 08/18/2024 Patient will demonstrate independence in advanced HEP. Baseline:  Goal status: INITIAL  2.  Patient will be able to participate in  a 6 MWT to improve community negotiation and functional mobility. Baseline:  Goal status: INITIAL  3.  Patient will be able to stand to brush his teeth and wash his face due to improved standing tolerance. Baseline:  Goal status: INITIAL   4.  Patient will perform 5STS in < or = to 16 sec due to improved LE strength and functional mobility. Baseline: 20.81 sec Goal status: INITIAL  5.  Patient will perform TUG in < or = to 18sec to decrease falls risk. Baseline: 23.05 Goal status: INITIAL  6.  Patient will demonstrate improved LE mobility needed for greater ease with putting on socks and  shoes in sitting . Baseline:  Goal status: INITIAL   PLAN:  PT FREQUENCY: 2x/week  PT DURATION: 8 weeks  PLANNED INTERVENTIONS: [id number]- PT Re-evaluation, 97110-Therapeutic exercises, 97530- Therapeutic activity, 97112- Neuromuscular re-education, 97535- Self Care, [id number]- Manual therapy, [phone]- Gait training, [phone]- Canalith repositioning, [id number]- Aquatic Therapy, [phone]- Electrical stimulation (unattended), [phone]- Electrical stimulation (manual), [id number]- Vasopneumatic device, [id number]- Ultrasound, 79439 (1-2 muscles), 20561 (3+ muscles)- Dry Needling, Patient/Family education, Balance training, Stair training, Taping, Joint mobilization, Joint manipulation, Spinal manipulation, Spinal mobilization, Vestibular training, Cryotherapy, and Moist heat  PLAN FOR NEXT SESSION: 5lb lifting restriction until cleared by surgeon 12/2 next visit; standing/ walking tolerance; assess strength; balance; may try gait with cane; sit to stand   Glade Pesa, PT 07/05/24 5:32 PM Phone: [phone] Fax: [phone]  [facility] [address] [place], [place] [address] Phone # [phone] Fax [phone]

## 2024-07-12 ENCOUNTER — Ambulatory Visit: Admitting: Neurosurgery

## 2024-07-12 ENCOUNTER — Encounter: Payer: Self-pay | Admitting: Neurosurgery

## 2024-07-12 VITALS — BP 132/74 | HR 76 | Temp 97.5°F | Ht 67.0 in | Wt 172.0 lb

## 2024-07-12 DIAGNOSIS — Z982 Presence of cerebrospinal fluid drainage device: Secondary | ICD-10-CM

## 2024-07-12 DIAGNOSIS — G912 (Idiopathic) normal pressure hydrocephalus: Secondary | ICD-10-CM

## 2024-07-12 DIAGNOSIS — Z4889 Encounter for other specified surgical aftercare: Secondary | ICD-10-CM

## 2024-07-12 NOTE — Progress Notes (Signed)
 68yo gentleman with NPH.  We have him set at 150.  Since the shunt placement he has been walking better and is brushing his teeth and ambulating but feels that lately his urinary problems have gotten slightly worse.  I dialed the valve down to a setting of 140 and I will see him back in 2 weeks.

## 2024-07-14 ENCOUNTER — Ambulatory Visit: Attending: Neurosurgery | Admitting: Physical Therapy

## 2024-07-14 ENCOUNTER — Encounter: Payer: Self-pay | Admitting: Physical Therapy

## 2024-07-14 DIAGNOSIS — R262 Difficulty in walking, not elsewhere classified: Secondary | ICD-10-CM | POA: Insufficient documentation

## 2024-07-14 DIAGNOSIS — M6281 Muscle weakness (generalized): Secondary | ICD-10-CM | POA: Insufficient documentation

## 2024-07-14 DIAGNOSIS — R2689 Other abnormalities of gait and mobility: Secondary | ICD-10-CM | POA: Insufficient documentation

## 2024-07-14 NOTE — Therapy (Signed)
 OUTPATIENT PHYSICAL THERAPY LOWER EXTREMITY PROGRESS NOTE   Patient Name: Jeffrey [name] MRN: [id number] DOB:1956-06-25, 68 y.o., male Today's Date: 07/14/2024  END OF SESSION:  PT End of Session - 07/14/24 1617     Visit Number 3    Date for Recertification  08/18/24    Authorization Type HTA    Progress Note Due on Visit 70    PT Start Time 1617    PT Stop Time 1700    PT Time Calculation (min) 43 min    Equipment Utilized During Treatment Gait belt    Activity Tolerance Patient tolerated treatment well          Past Medical History:  Diagnosis Date   Cardiac arrest ([name]) 05/22/2016   Cataract    CHF (congestive heart failure) ([name])    CKD (chronic kidney disease) stage 3, GFR 30-59 ml/min ([name]) 05/28/2021   CKD (chronic kidney disease) stage 4, GFR 15-29 ml/min ([name]) 05/28/2021   Coronary artery disease    LAD PCI   Diabetes mellitus without complication ([name])    type 2   Diabetic retinopathy ([name])    Hyperlipidemia    Hypertension    Memory loss    mild   Myocardial infarct ([name]) 2105   Retinopathy    Both eyes   S/P primary angioplasty with coronary stent 05/22/2016   Sleep-disordered breathing 12/31/2023   Vitamin B12 deficiency    Vitreous hemorrhage of left eye ([name])    proliferative diabetic retinopathy   Past Surgical History:  Procedure Laterality Date   ANGIOPLASTY     2015   COLONOSCOPY     multiple   EYE SURGERY     PARS PLANA VITRECTOMY Left 03/15/2020   Procedure: PARS PLANA VITRECTOMY WITH 25 GAUGE;  Surgeon: Jarold Mayo, MD;  Location: [facility] OR;  Service: Ophthalmology;  Laterality: Left;   PHOTOCOAGULATION WITH LASER Left 03/15/2020   Procedure: PHOTOCOAGULATION WITH LASER; INTRAVITREAL INJECTION OF AVASTIN ;  Surgeon: Jarold Mayo, MD;  Location: [facility] OR;  Service: Ophthalmology;  Laterality: Left;   REFRACTIVE SURGERY     UPPER GI ENDOSCOPY     several   VENTRICULOPERITONEAL SHUNT Right 06/06/2024   Procedure: SHUNT INSERTION  VENTRICULAR-PERITONEAL;  Surgeon: Rosslyn Dino HERO, MD;  Location: [facility] OR;  Service: Neurosurgery;  Laterality: Right;   VITRECTOMY     Patient Active Problem List   Diagnosis Date Noted   NPH (normal pressure hydrocephalus) ([name]) 05/23/2024   Sleep-disordered breathing 12/31/2023   Actinic keratosis 11/30/2023   Vitamin D  deficiency 11/30/2023   Rhinitis 05/20/2023   Cervical spondylosis 08/26/2022   CKD (chronic kidney disease) stage 4, GFR 15-29 ml/min ([name]) 05/28/2021   Adjustment disorder 01/22/2021   Chronic back pain 10/22/2020   Peripheral vertigo 05/15/2020   Dermatitis 01/04/2020   Unintentional weight loss with loose stools 05/19/2019   Low vitamin B12 level 03/29/2019   Heme positive stool 03/14/2019   Normocytic anemia 03/14/2019   Chronic diastolic heart failure ([name]) 02/08/2019   Diabetic retinopathy ([name]) 09/08/2018   Physical debility 05/24/2018   Varicose veins of both lower extremities 03/01/2018   Fatigue due to exertion 10/14/2017   GERD (gastroesophageal reflux disease) 08/25/2017   CAD in native artery 04/14/2017   Hyperlipidemia associated with type 2 diabetes mellitus ([name]) 04/14/2017   Diabetic peripheral neuropathy associated with type 2 diabetes mellitus ([name]) 08/17/2016   [name] 05/02/2016   Hypertension associated with diabetes ([name]) 05/02/2016    PCP: Parker, Caleb  CHRISTELLA, MD   REFERRING PROVIDER: Rosslyn Dino CHRISTELLA, MD  REFERRING DIAG: G91.2 (ICD-10-CM) - NPH (normal pressure hydrocephalus) ([name])  THERAPY DIAG:  Muscle weakness (generalized)  Other abnormalities of gait and mobility  Difficulty in walking, not elsewhere classified  Rationale for Evaluation and Treatment: Rehabilitation  ONSET DATE: 06/06/2024  SUBJECTIVE:   SUBJECTIVE STATEMENT: Drained more fluid off 2 days ago.  The doctor emphasized PT. The doctor says the walker will be history!  5# lifting limit continues.  Did OK following last session.   Standing to brush teeth now.  Side of head is tender from the tubing.   Eval: Patients presents after having a entriculoperitoneal shunt placements. His brother reports it is easier for him to get out of bed, he walking is smoother, and . Yesterday he showered independently with his shower chair. He negotiated the ledge to get out of the shower independently and with confidence. He presents today ambulating with his walker. He also has had improvements in his memory.  If his improvements plateau/ get stagnant surgeon will let more fluid out of his ventricles. This has to be done in increments.   From 11/07 MD Note: 68 year old gentleman with a past medical history significant for normal pressure hydrocephalus.  He underwent placement of a right-sided ventriculoperitoneal shunt programmed at 150. He returns today with his brother and states that he is doing much better: He is able to walk better, get in and out of bed better, and get in and out of his chair is better.  Cognitively he feels like he has far more energy than he used to.      PERTINENT HISTORY: evidence of lumbar compression deformity Compression fx L1 2022, CAD, CKD (stage 4), DM, cardiac arrest 2017, neuropathy, retinopathy Long history of gait abnormality beginning back in 2022. He has a history of CAD and believes that his debility began following a covid infection. In December odf 2022 he fell and suffered a compression fracture of L1.  PAIN:  Are you having pain? No and He has tubing in his abdomen that is sometimes uncomfortable. They move around which is normal.  PRECAUTIONS: Other: 5lb lifting restriction until cleared by surgeon   RED FLAGS: None   WEIGHT BEARING RESTRICTIONS: No  FALLS:  Has patient fallen in last 6 months? No  LIVING ENVIRONMENT: Lives with: lives with their family Lives in: House/apartment Stairs: No Has following equipment at home: Single point cane, Environmental Consultant - 2 wheeled, Environmental Consultant - 4 wheeled,  shower chair, Grab bars, and and stand up walker also; also has shower chair at sink for shaving, brushing teeth etc, also has transporter.  OCCUPATION: Retired  PLOF: Independent with household mobility with device, Requires assistive device for independence, Needs assistance with ADLs, and Brother assists with dressing; patient showers independently with shower chair  PATIENT GOALS: To be more independent; to not us  the walker  NEXT MD VISIT: July 12, 2024  OBJECTIVE:  Note: Objective measures were completed at Evaluation unless otherwise noted.  DIAGNOSTIC FINDINGS: None  PATIENT SURVEYS:  ABC scale: The Activities-Specific Balance Confidence (ABC) Scale 0% 10 20 30  40 50 60 70 80 90 100% No confidence<->completely confident  "How confident are you that you will not lose your balance or become unsteady when you . . .   Date tested 06/23/2024  Walk around the house 80%  2. Walk up or down stairs 0%  3. Bend over and pick up a slipper from in front of a closet  floor 0%  4. Reach for a small can off a shelf at eye level 0%  5. Stand on tip toes and reach for something above your head 0%  6. Stand on a chair and reach for something 0%  7. Sweep the floor 0%  8. Walk outside the house to a car parked in the driveway 50%  9. Get into or out of a car 80%  10. Walk across a parking lot to the mall 20%  11. Walk up or down a ramp 30%  12. Walk in a crowded mall where people rapidly walk past you 80%  13. Are bumped into by people as you walk through the mall 30%  14. Step onto or off of an escalator while you are holding onto the railing 0%  15. Step onto or off an escalator while holding onto parcels such that you cannot hold onto the railing 0%  16. Walk outside on icy sidewalks 0%  Total: 370/16 23.12%     COGNITION: Overall cognitive status: History of cognitive impairments - at baseline     SENSATION: Neuropathy    POSTURE: rounded shoulders and forward  head   LOWER EXTREMITY ROM:WFL for tasks assessed    LOWER EXTREMITY MMT:  MMT Right eval Left eval  Hip flexion    Hip extension    Hip abduction    Hip adduction    Hip internal rotation    Hip external rotation    Knee flexion    Knee extension    Ankle dorsiflexion    Ankle plantarflexion    Ankle inversion    Ankle eversion     (Blank rows = not tested)    FUNCTIONAL TESTS:  5 times sit to stand: 20.81 sec with UE support Timed up and go (TUG): 23.05 sec with rollator 3 minute walk test: 161ft with rollator; needed a seated rest break after 2:15 sec  GAIT: Distance walked: 157ft Assistive device utilized: Environmental Consultant - 4 wheeled Level of assistance: Modified independence Comments: decreased cadence                                                                                                                                 TREATMENT DATE:  07/14/24: Nu-Step seat 9, arms 10 L 5 while discussing status and response to treatment Pt able to stand unsupported to remove and later put on his coat Gait without assistive device 100 feet with close follow with wheelchair and CGA with gait belt (2-3 standing rest breaks) min verbal cues for increased arm swing Sit to stand with cues to use armrests to rise and to guide sitting down no physical assist needed Seated LAQ 5# 10x Seated hip flexion 5# 10x Seated heel and toe raises 10x Gait to waiting room no device 20 feet CGA  11/25: Nu-Step seat 9, arms 10 L 5 while discussing status and response to treatment Gait with RW 60 feet with  upright posture and good toe clearance In // bars: gait without UE assist with turns without UE assist In // bars: stepping over canes/dowels on floor (light UE assist needed) In // bars: 4 inch step ups 7x bil UE assist needed Sit to stand 5x with min to mod UE assist needed Sit to stand from chair + foam pad NO UE assist needed 5x Gait without assistive device with close CGA 40 feet (pt's  brother followed with RW for safety) RPE (Rating of Perceived Exertion):    7 /10    06/23/2024 Initial Evaluation & HEP created    PATIENT EDUCATION:  Education details: PT eval findings, anticipated POC, progress with PT, and initial HEP  Person educated: Patient Education method: Explanation, Demonstration, and Handouts Education comprehension: verbalized understanding, returned demonstration, and needs further education  HOME EXERCISE PROGRAM: Access Code: [email or web address] URL: [email or web address] Date: 06/23/2024 Prepared by: Kristeen Sar  Exercises - Seated March  - 1 x daily - 7 x weekly - 1-2 sets - 10 reps - Seated Long Arc Quad  - 1 x daily - 7 x weekly - 1-2 sets - 10 reps - Heel Raises with Counter Support  - 1 x daily - 7 x weekly - 1-2 sets - 10 reps - Standing March with Counter Support  - 1 x daily - 7 x weekly - 1-2 sets - 10 reps - Standing Hip Abduction with Counter Support  - 1 x daily - 7 x weekly - 1-2 sets - 10 reps - Sit to Stand with Armchair  - 1 x daily - 7 x weekly - 1-2 sets - 5 reps  ASSESSMENT:  CLINICAL IMPRESSION: [name] continues have much improved swing through gait and toe clearance as well as more erect posture since his procedure.  He is able to stand without UE support to remove and put back on his coat.  He continues to have decreased stamina and conditioning for prolonged standing and walking but again much improved since his procedure.  Therapist providing cues for sit to stand safety (arms on armrests) and providing close CGA during gait for safety.     Eval;Patient is a 68 y.o. male who was seen today for physical therapy evaluation and treatment for normal pressure hydrocephalus. Shaarav is well known in this clinic from previous plan of care. On 10/27 he underwent surgery to place a ventriculoperitoneal shunt. His brother, [name], was present to discuss improvements noted since having the shunt place. His brother verbalized  improvements in his bed mobility, posture, balance, and walking ability. We was able to walk for 2 minutes with his rollator today which is much improved compared to his previous standard of care. Based on evaluation noted muscle weakness, poor endurance, and increased fall risk. Patient is highly motivated and wants to maintain independence. Patient will benefit from skilled PT to address the below impairments and improve overall function.   OBJECTIVE IMPAIRMENTS: Abnormal gait, decreased activity tolerance, decreased balance, decreased endurance, decreased mobility, difficulty walking, decreased ROM, decreased strength, increased muscle spasms, impaired flexibility, postural dysfunction, and pain.   ACTIVITY LIMITATIONS: carrying, lifting, bending, standing, squatting, transfers, bed mobility, bathing, toileting, dressing, reach over head, hygiene/grooming, and locomotion level  PARTICIPATION LIMITATIONS: cleaning, laundry, shopping, and community activity  PERSONAL FACTORS: Age, Fitness, Time since onset of injury/illness/exacerbation, and 3+ comorbidities: Compression fx L1 2022, CAD, CKD (stage 4), neuropathy, retinopathy are also affecting patient's functional outcome.   REHAB POTENTIAL: Good  CLINICAL DECISION MAKING: Evolving/moderate complexity  EVALUATION  COMPLEXITY: High   GOALS: Goals reviewed with patient? Yes  SHORT TERM GOALS: Target date: 07/21/2024 Patient will be independent with initial HEP. Baseline:  Goal status: INITIAL  2.  Patient will be able to participate in a without needed a seated rest break due to improved cardiovascular endurance. Baseline:  Goal status: met 12/4    LONG TERM GOALS: Target date: 08/18/2024 Patient will demonstrate independence in advanced HEP. Baseline:  Goal status: INITIAL  2.  Patient will be able to participate in a 6 MWT to improve community negotiation and functional mobility. Baseline:  Goal status: INITIAL  3.   Patient will be able to stand to brush his teeth and wash his face due to improved standing tolerance. Baseline:  Goal status: INITIAL   4.  Patient will perform 5STS in < or = to 16 sec due to improved LE strength and functional mobility. Baseline: 20.81 sec Goal status: INITIAL  5.  Patient will perform TUG in < or = to 18sec to decrease falls risk. Baseline: 23.05 Goal status: INITIAL  6.  Patient will demonstrate improved LE mobility needed for greater ease with putting on socks and shoes in sitting . Baseline:  Goal status: INITIAL   PLAN:  PT FREQUENCY: 2x/week  PT DURATION: 8 weeks  PLANNED INTERVENTIONS: [id number]- PT Re-evaluation, 97110-Therapeutic exercises, 97530- Therapeutic activity, 97112- Neuromuscular re-education, 97535- Self Care, [id number]- Manual therapy, [phone]- Gait training, [phone]- Canalith repositioning, [id number]- Aquatic Therapy, [phone]- Electrical stimulation (unattended), [phone]- Electrical stimulation (manual), [id number]- Vasopneumatic device, [id number]- Ultrasound, 79439 (1-2 muscles), [id number] (3+ muscles)- Dry Needling, Patient/Family education, Balance training, Stair training, Taping, Joint mobilization, Joint manipulation, Spinal manipulation, Spinal mobilization, Vestibular training, Cryotherapy, and Moist heat  PLAN FOR NEXT SESSION: 5lb lifting restriction continues; standing/ walking;  gait without the RW have 2nd person follow with WC in case of overfatigue;  strength; balance;  sit to stand   Glade Pesa, PT 07/14/24 9:05 PM Phone: [phone] Fax: [phone]  [facility] [address] [place], [place] [address] Phone # [phone] Fax [phone]

## 2024-07-19 ENCOUNTER — Ambulatory Visit: Admitting: Physical Therapy

## 2024-07-19 ENCOUNTER — Encounter: Payer: Self-pay | Admitting: Physical Therapy

## 2024-07-19 DIAGNOSIS — M6281 Muscle weakness (generalized): Secondary | ICD-10-CM | POA: Diagnosis not present

## 2024-07-19 DIAGNOSIS — R2689 Other abnormalities of gait and mobility: Secondary | ICD-10-CM

## 2024-07-19 DIAGNOSIS — R262 Difficulty in walking, not elsewhere classified: Secondary | ICD-10-CM

## 2024-07-19 NOTE — Therapy (Signed)
 OUTPATIENT PHYSICAL THERAPY LOWER EXTREMITY PROGRESS NOTE   Patient Name: Jeffrey [name] MRN: [id number] DOB:[birth year], 68 y.o., male Today's Date: 07/19/2024  END OF SESSION:  PT End of Session - 07/19/24 1610     Visit Number 4    Date for Recertification  08/18/24    Authorization Type HTA    PT Start Time 1520    PT Stop Time 1600    PT Time Calculation (min) 40 min    Equipment Utilized During Treatment Gait belt    Activity Tolerance Patient tolerated treatment well    Behavior During Therapy WFL for tasks assessed/performed           Past Medical History:  Diagnosis Date   Cardiac arrest ([name]) 05/22/2016   Cataract    CHF (congestive heart failure) ([name])    CKD (chronic kidney disease) stage 3, GFR 30-59 ml/min ([name]) 05/28/2021   CKD (chronic kidney disease) stage 4, GFR 15-29 ml/min ([name]) 05/28/2021   Coronary artery disease    LAD PCI   Diabetes mellitus without complication ([name])    type 2   Diabetic retinopathy ([name])    Hyperlipidemia    Hypertension    Memory loss    mild   Myocardial infarct ([name]) 2105   Retinopathy    Both eyes   S/P primary angioplasty with coronary stent 05/22/2016   Sleep-disordered breathing 12/31/2023   Vitamin B12 deficiency    Vitreous hemorrhage of left eye ([name])    proliferative diabetic retinopathy   Past Surgical History:  Procedure Laterality Date   ANGIOPLASTY     2015   COLONOSCOPY     multiple   EYE SURGERY     PARS PLANA VITRECTOMY Left 03/15/2020   Procedure: PARS PLANA VITRECTOMY WITH 25 GAUGE;  Surgeon: Jarold Mayo, MD;  Location: [facility] OR;  Service: Ophthalmology;  Laterality: Left;   PHOTOCOAGULATION WITH LASER Left 03/15/2020   Procedure: PHOTOCOAGULATION WITH LASER; INTRAVITREAL INJECTION OF AVASTIN ;  Surgeon: Jarold Mayo, MD;  Location: [facility] OR;  Service: Ophthalmology;  Laterality: Left;   REFRACTIVE SURGERY     UPPER GI ENDOSCOPY     several   VENTRICULOPERITONEAL SHUNT Right 06/06/2024    Procedure: SHUNT INSERTION VENTRICULAR-PERITONEAL;  Surgeon: Rosslyn Dino HERO, MD;  Location: [facility] OR;  Service: Neurosurgery;  Laterality: Right;   VITRECTOMY     Patient Active Problem List   Diagnosis Date Noted   NPH (normal pressure hydrocephalus) ([name]) 05/23/2024   Sleep-disordered breathing 12/31/2023   Actinic keratosis 11/30/2023   Vitamin D  deficiency 11/30/2023   Rhinitis 05/20/2023   Cervical spondylosis 08/26/2022   CKD (chronic kidney disease) stage 4, GFR 15-29 ml/min ([name]) 05/28/2021   Adjustment disorder 01/22/2021   Chronic back pain 10/22/2020   Peripheral vertigo 05/15/2020   Dermatitis 01/04/2020   Unintentional weight loss with loose stools 05/19/2019   Low vitamin B12 level 03/29/2019   Heme positive stool 03/14/2019   Normocytic anemia 03/14/2019   Chronic diastolic heart failure ([name]) 02/08/2019   Diabetic retinopathy ([name]) 09/08/2018   Physical debility 05/24/2018   Varicose veins of both lower extremities 03/01/2018   Fatigue due to exertion 10/14/2017   GERD (gastroesophageal reflux disease) 08/25/2017   CAD in native artery 04/14/2017   Hyperlipidemia associated with type 2 diabetes mellitus ([name]) 04/14/2017   Diabetic peripheral neuropathy associated with type 2 diabetes mellitus ([name]) 08/17/2016   [name] 05/02/2016   Hypertension associated with diabetes ([name]) 05/02/2016    PCP:  Kennyth Worth HERO, MD   REFERRING PROVIDER: Rosslyn Dino HERO, MD  REFERRING DIAG: G91.2 (ICD-10-CM) - NPH (normal pressure hydrocephalus) [facility])  THERAPY DIAG:  Muscle weakness (generalized)  Other abnormalities of gait and mobility  Difficulty in walking, not elsewhere classified  Rationale for Evaluation and Treatment: Rehabilitation  ONSET DATE: 06/06/2024  SUBJECTIVE:   SUBJECTIVE STATEMENT: I am drained today. My head has been bothering me it feels like pressure.  Eval: Patients presents after having a entriculoperitoneal  shunt placements. His brother reports it is easier for him to get out of bed, he walking is smoother, and . Yesterday he showered independently with his shower chair. He negotiated the ledge to get out of the shower independently and with confidence. He presents today ambulating with his walker. He also has had improvements in his memory.  If his improvements plateau/ get stagnant surgeon will let more fluid out of his ventricles. This has to be done in increments.   From 11/07 MD Note: 68 year old gentleman with a past medical history significant for normal pressure hydrocephalus.  He underwent placement of a right-sided ventriculoperitoneal shunt programmed at 150. He returns today with his brother and states that he is doing much better: He is able to walk better, get in and out of bed better, and get in and out of his chair is better.  Cognitively he feels like he has far more energy than he used to.      PERTINENT HISTORY: evidence of lumbar compression deformity Compression fx L1 2022, CAD, CKD (stage 4), DM, cardiac arrest 2017, neuropathy, retinopathy Long history of gait abnormality beginning back in 2022. He has a history of CAD and believes that his debility began following a covid infection. In December odf 2022 he fell and suffered a compression fracture of L1.  PAIN:  Are you having pain? No and He has tubing in his abdomen that is sometimes uncomfortable. They move around which is normal.  PRECAUTIONS: Other: 5lb lifting restriction until cleared by surgeon   RED FLAGS: None   WEIGHT BEARING RESTRICTIONS: No  FALLS:  Has patient fallen in last 6 months? No  LIVING ENVIRONMENT: Lives with: lives with their family Lives in: House/apartment Stairs: No Has following equipment at home: Single point cane, Environmental Consultant - 2 wheeled, Environmental Consultant - 4 wheeled, shower chair, Grab bars, and and stand up walker also; also has shower chair at sink for shaving, brushing teeth etc, also has  transporter.  OCCUPATION: Retired  PLOF: Independent with household mobility with device, Requires assistive device for independence, Needs assistance with ADLs, and Brother assists with dressing; patient showers independently with shower chair  PATIENT GOALS: To be more independent; to not us  the walker  NEXT MD VISIT: July 12, 2024  OBJECTIVE:  Note: Objective measures were completed at Evaluation unless otherwise noted.  DIAGNOSTIC FINDINGS: None  PATIENT SURVEYS:  ABC scale: The Activities-Specific Balance Confidence (ABC) Scale 0% 10 20 30  40 50 60 70 80 90 100% No confidence<->completely confident  "How confident are you that you will not lose your balance or become unsteady when you . . .   Date tested 06/23/2024  Walk around the house 80%  2. Walk up or down stairs 0%  3. Bend over and pick up a slipper from in front of a closet floor 0%  4. Reach for a small can off a shelf at eye level 0%  5. Stand on tip toes and reach for something above your head 0%  6. Stand on a chair and reach for something 0%  7. Sweep the floor 0%  8. Walk outside the house to a car parked in the driveway 50%  9. Get into or out of a car 80%  10. Walk across a parking lot to the mall 20%  11. Walk up or down a ramp 30%  12. Walk in a crowded mall where people rapidly walk past you 80%  13. Are bumped into by people as you walk through the mall 30%  14. Step onto or off of an escalator while you are holding onto the railing 0%  15. Step onto or off an escalator while holding onto parcels such that you cannot hold onto the railing 0%  16. Walk outside on icy sidewalks 0%  Total: 370/16 23.12%     COGNITION: Overall cognitive status: History of cognitive impairments - at baseline     SENSATION: Neuropathy    POSTURE: rounded shoulders and forward head   LOWER EXTREMITY ROM:WFL for tasks assessed    LOWER EXTREMITY MMT:  MMT Right eval Left eval  Hip flexion    Hip  extension    Hip abduction    Hip adduction    Hip internal rotation    Hip external rotation    Knee flexion    Knee extension    Ankle dorsiflexion    Ankle plantarflexion    Ankle inversion    Ankle eversion     (Blank rows = not tested)    FUNCTIONAL TESTS:  5 times sit to stand: 20.81 sec with UE support Timed up and go (TUG): 23.05 sec with rollator 3 minute walk test: 124ft with rollator; needed a seated rest break after 2:15 sec  GAIT: Distance walked: 132ft Assistive device utilized: Environmental Consultant - 4 wheeled Level of assistance: Modified independence Comments: decreased cadence                                                                                                                                 TREATMENT DATE:  07/19/24: Pt able to stand unsupported to remove and later put on his coat Nu-Step seat 9, arms 10 L 5 while discussing status and response to treatment; 6 mins Gait without assistive device 108 feet with close follow with wheelchair and CGA with gait belt (2 standing rest breaks) min verbal cues for increased arm swing x 2 laps Seated LAQ 3# 10x Seated hip flexion 3# 10x Seated heel and toe raises 10x Seated biceps curl 2# DB x 10    07/14/24: Nu-Step seat 9, arms 10 L 5 while discussing status and response to treatment Pt able to stand unsupported to remove and later put on his coat Gait without assistive device 100 feet with close follow with wheelchair and CGA with gait belt (2-3 standing rest breaks) min verbal cues for increased arm swing Sit to stand with cues to use armrests to rise and  to guide sitting down no physical assist needed Seated LAQ 5# 10x Seated hip flexion 5# 10x Seated heel and toe raises 10x Gait to waiting room no device 20 feet CGA  11/25: Nu-Step seat 9, arms 10 L 5 while discussing status and response to treatment Gait with RW 60 feet with upright posture and good toe clearance In // bars: gait without UE assist with  turns without UE assist In // bars: stepping over canes/dowels on floor (light UE assist needed) In // bars: 4 inch step ups 7x bil UE assist needed Sit to stand 5x with min to mod UE assist needed Sit to stand from chair + foam pad NO UE assist needed 5x Gait without assistive device with close CGA 40 feet (pt's brother followed with RW for safety) RPE (Rating of Perceived Exertion):    7 /10  06/23/2024 Initial Evaluation & HEP created    PATIENT EDUCATION:  Education details: PT eval findings, anticipated POC, progress with PT, and initial HEP  Person educated: Patient Education method: Explanation, Demonstration, and Handouts Education comprehension: verbalized understanding, returned demonstration, and needs further education  HOME EXERCISE PROGRAM: Access Code: [email or web address] URL: [email or web address] Date: 06/23/2024 Prepared by: Kristeen Sar  Exercises - Seated March  - 1 x daily - 7 x weekly - 1-2 sets - 10 reps - Seated Long Arc Quad  - 1 x daily - 7 x weekly - 1-2 sets - 10 reps - Heel Raises with Counter Support  - 1 x daily - 7 x weekly - 1-2 sets - 10 reps - Standing March with Counter Support  - 1 x daily - 7 x weekly - 1-2 sets - 10 reps - Standing Hip Abduction with Counter Support  - 1 x daily - 7 x weekly - 1-2 sets - 10 reps - Sit to Stand with Armchair  - 1 x daily - 7 x weekly - 1-2 sets - 5 reps  ASSESSMENT:  CLINICAL IMPRESSION: Patient presents with increased fatigue today. He has been having increased pressure in his head since his MD drained more fluid. His brother [name] also reported that his glucose levels has been challenging keep steady. Encouraged them to send surgeon a message about these concerns and his changes in mood. They will follow up about this. Jerimy was able to walk 100 feet x 2 today. He required verbal cues for arm swing. He had two instances of instability, but this was when there were moving objects/people around him. He  verbalized just needing to focus. Due to patient's fatigue exercise intensity was kept lower today. His next MD appointment is in 2 weeks.    Eval;Patient is a 68 y.o. male who was seen today for physical therapy evaluation and treatment for normal pressure hydrocephalus. Ziah is well known in this clinic from previous plan of care. On 10/27 he underwent surgery to place a ventriculoperitoneal shunt. His brother, [name], was present to discuss improvements noted since having the shunt place. His brother verbalized improvements in his bed mobility, posture, balance, and walking ability. We was able to walk for 2 minutes with his rollator today which is much improved compared to his previous standard of care. Based on evaluation noted muscle weakness, poor endurance, and increased fall risk. Patient is highly motivated and wants to maintain independence. Patient will benefit from skilled PT to address the below impairments and improve overall function.   OBJECTIVE IMPAIRMENTS: Abnormal gait, decreased activity tolerance, decreased balance, decreased endurance, decreased mobility, difficulty walking,  decreased ROM, decreased strength, increased muscle spasms, impaired flexibility, postural dysfunction, and pain.   ACTIVITY LIMITATIONS: carrying, lifting, bending, standing, squatting, transfers, bed mobility, bathing, toileting, dressing, reach over head, hygiene/grooming, and locomotion level  PARTICIPATION LIMITATIONS: cleaning, laundry, shopping, and community activity  PERSONAL FACTORS: Age, Fitness, Time since onset of injury/illness/exacerbation, and 3+ comorbidities: Compression fx L1 2022, CAD, CKD (stage 4), neuropathy, retinopathy are also affecting patient's functional outcome.   REHAB POTENTIAL: Good  CLINICAL DECISION MAKING: Evolving/moderate complexity  EVALUATION COMPLEXITY: High   GOALS: Goals reviewed with patient? Yes  SHORT TERM GOALS: Target date: 07/21/2024 Patient will  be independent with initial HEP. Baseline:  Goal status: INITIAL  2.  Patient will be able to participate in a without needed a seated rest break due to improved cardiovascular endurance. Baseline:  Goal status: met 12/4    LONG TERM GOALS: Target date: 08/18/2024 Patient will demonstrate independence in advanced HEP. Baseline:  Goal status: INITIAL  2.  Patient will be able to participate in a 6 MWT to improve community negotiation and functional mobility. Baseline:  Goal status: INITIAL  3.  Patient will be able to stand to brush his teeth and wash his face due to improved standing tolerance. Baseline:  Goal status: INITIAL   4.  Patient will perform 5STS in < or = to 16 sec due to improved LE strength and functional mobility. Baseline: 20.81 sec Goal status: INITIAL  5.  Patient will perform TUG in < or = to 18sec to decrease falls risk. Baseline: 23.05 Goal status: INITIAL  6.  Patient will demonstrate improved LE mobility needed for greater ease with putting on socks and shoes in sitting . Baseline:  Goal status: INITIAL   PLAN:  PT FREQUENCY: 2x/week  PT DURATION: 8 weeks  PLANNED INTERVENTIONS: 97164- PT Re-evaluation, 97110-Therapeutic exercises, 97530- Therapeutic activity, 97112- Neuromuscular re-education, 97535- Self Care, [id number]- Manual therapy, [phone]- Gait training, [phone]- Canalith repositioning, [phone]- Aquatic Therapy, [phone]- Electrical stimulation (unattended), [phone]- Electrical stimulation (manual), [id number]- Vasopneumatic device, [id number]- Ultrasound, 79439 (1-2 muscles), 20561 (3+ muscles)- Dry Needling, Patient/Family education, Balance training, Stair training, Taping, Joint mobilization, Joint manipulation, Spinal manipulation, Spinal mobilization, Vestibular training, Cryotherapy, and Moist heat  PLAN FOR NEXT SESSION: 5lb lifting restriction continues; ask about mood and sugar levels and if they followed up with the MD about it. standing/ walking;   gait without the RW have 2nd person follow with WC in case of overfatigue;  strength; balance;  sit to stand    Kristeen Sar, PT, DPT 07/19/24 4:12 PM [facility] [address] [place], [place] [address] Phone # [phone] Fax [phone]

## 2024-07-20 ENCOUNTER — Telehealth: Payer: Self-pay

## 2024-07-20 NOTE — Telephone Encounter (Signed)
 Patient's brother called to ask if they needed to come in after Mohsen has an Xray. I informed them that they only need to come in if he has an MRI due to the magnets.   While on the phone, the patient tells me he is having headaches since his last shunt adjustment and it is keeping him up at night.   No other symptoms were discussed.   I asked them to come in on Friday to see Dr. Janjua for a potential shunt adjustment.

## 2024-07-21 ENCOUNTER — Ambulatory Visit: Admitting: Physical Therapy

## 2024-07-22 ENCOUNTER — Encounter: Payer: Self-pay | Admitting: Neurosurgery

## 2024-07-22 ENCOUNTER — Ambulatory Visit: Admitting: Neurosurgery

## 2024-07-22 VITALS — BP 112/76 | HR 89 | Temp 97.6°F | Ht 67.0 in | Wt 172.0 lb

## 2024-07-22 DIAGNOSIS — Z982 Presence of cerebrospinal fluid drainage device: Secondary | ICD-10-CM

## 2024-07-22 DIAGNOSIS — G912 (Idiopathic) normal pressure hydrocephalus: Secondary | ICD-10-CM

## 2024-07-22 MED ORDER — BACLOFEN 10 MG PO TABS: 10.0000 mg | ORAL_TABLET | Freq: Two times a day (BID) | ORAL | 1 refills | Status: AC

## 2024-07-22 NOTE — Progress Notes (Signed)
 68 year old gentleman with a history of normal pressure hydrocephalus in whom we placed a right-sided ventriculoperitoneal shunt.  At his last clinic visit with me, we dialed the valve to a setting of 140.  He returns today and says that he is having a few symptoms: First of all he is having pain on top of his head and pointed at the parietal boss significantly higher than where the incision is.  Additionally, he is having toothache as well.  He is also having abdominal discomfort and all night long he is wiggling around it.  Unfortunately he is not taking any of the baclofen  that we sent him home with and in fact, his brother threw them away thinking that these were opioids.  I think that the pain on top of his head likely is not related to the incision and as he can pinpoint it to that location.  I told him that I would like to treat him with some baclofen  and told him to take 10 mg at night only for the first week and warned him about the sedative effects.  After that, we will go up to 10 in the morning and 10 at night.  He is scheduled to come and see me next Friday and will let me know how he is doing.  For now, we will leave the valve at the setting of 140 and told him that if we keep dialing the valve down, the risk of hemorrhage will increase.  I will see him back next week.

## 2024-07-25 ENCOUNTER — Telehealth: Payer: Self-pay

## 2024-07-25 NOTE — Telephone Encounter (Signed)
 Patient's brother called in to report that he is not tolerating the Baclofen .   Patient was having uncontrolled diarrhea, decreased appetite and feels more fatigued.  They stopped the medication due to these symptoms. They wanted Dr. Janjua to be aware.   They asked if the shunt could be adjusted on Friday. I said that they would need to discuss this with Dr. Janjua on Friday. They asked if it is safe to increase the shunt setting back to 150 and if this increases the risk of hemorrhage that Dr. Rosslyn discussed with them at the last appointment. Again I told them that these questions are out of my scope and that they would need to talk to Dr. Janjua on Friday.

## 2024-07-26 ENCOUNTER — Ambulatory Visit: Admitting: Physical Therapy

## 2024-07-26 ENCOUNTER — Encounter: Admitting: Neurosurgery

## 2024-07-26 ENCOUNTER — Other Ambulatory Visit: Payer: Self-pay | Admitting: Family Medicine

## 2024-07-28 ENCOUNTER — Ambulatory Visit: Admitting: Physical Therapy

## 2024-07-29 ENCOUNTER — Encounter: Payer: Self-pay | Admitting: Neurosurgery

## 2024-07-29 ENCOUNTER — Ambulatory Visit: Admitting: Neurosurgery

## 2024-07-29 VITALS — BP 126/76 | HR 82 | Temp 97.7°F | Ht 67.0 in | Wt 172.0 lb

## 2024-07-29 DIAGNOSIS — Z982 Presence of cerebrospinal fluid drainage device: Secondary | ICD-10-CM

## 2024-07-29 DIAGNOSIS — Z4889 Encounter for other specified surgical aftercare: Secondary | ICD-10-CM

## 2024-07-29 DIAGNOSIS — G912 (Idiopathic) normal pressure hydrocephalus: Secondary | ICD-10-CM

## 2024-07-29 NOTE — Progress Notes (Signed)
 68 year old gentleman with normal pressure hydrocephalus.  At his last visit with me I gave him some baclofen  for the abdominal discomfort and the pain on the right parietal boss and this did not sit well with him: He had loose stools, felt very fatigued and therefore stopped it.  He feels much better now.  He is going to have his wisdom teeth removed because that is hurting him a lot.  We decided to see each other back in a month and we will leave the valve at the setting of 140 for now.

## 2024-08-02 ENCOUNTER — Encounter: Payer: Self-pay | Admitting: Physical Therapy

## 2024-08-02 ENCOUNTER — Ambulatory Visit: Admitting: Physical Therapy

## 2024-08-02 DIAGNOSIS — M6281 Muscle weakness (generalized): Secondary | ICD-10-CM

## 2024-08-02 DIAGNOSIS — R262 Difficulty in walking, not elsewhere classified: Secondary | ICD-10-CM

## 2024-08-02 DIAGNOSIS — R2689 Other abnormalities of gait and mobility: Secondary | ICD-10-CM

## 2024-08-02 NOTE — Therapy (Signed)
 " OUTPATIENT PHYSICAL THERAPY LOWER EXTREMITY PROGRESS NOTE   Patient Name: Jeffrey [name] MRN: [id number] DOB:[birth year], 68 y.o., male Today's Date: 08/02/2024  END OF SESSION:  PT End of Session - 08/02/24 1604     Visit Number 5    Date for Recertification  08/18/24    Authorization Type HTA    PT Start Time 1535    PT Stop Time 1558    PT Time Calculation (min) 23 min    Activity Tolerance Patient tolerated treatment well    Behavior During Therapy [facility] for tasks assessed/performed            Past Medical History:  Diagnosis Date   Cardiac arrest ([name]) 05/22/2016   Cataract    CHF (congestive heart failure) ([name])    CKD (chronic kidney disease) stage 3, GFR 30-59 ml/min ([name]) 05/28/2021   CKD (chronic kidney disease) stage 4, GFR 15-29 ml/min ([name]) 05/28/2021   Coronary artery disease    LAD PCI   Diabetes mellitus without complication ([name])    type 2   Diabetic retinopathy ([name])    Hyperlipidemia    Hypertension    Memory loss    mild   Myocardial infarct ([name]) 2105   Retinopathy    Both eyes   S/P primary angioplasty with coronary stent 05/22/2016   Sleep-disordered breathing 12/31/2023   Vitamin B12 deficiency    Vitreous hemorrhage of left eye ([name])    proliferative diabetic retinopathy   Past Surgical History:  Procedure Laterality Date   ANGIOPLASTY     2015   COLONOSCOPY     multiple   EYE SURGERY     PARS PLANA VITRECTOMY Left 03/15/2020   Procedure: PARS PLANA VITRECTOMY WITH 25 GAUGE;  Surgeon: Jarold Mayo, MD;  Location: [facility] OR;  Service: Ophthalmology;  Laterality: Left;   PHOTOCOAGULATION WITH LASER Left 03/15/2020   Procedure: PHOTOCOAGULATION WITH LASER; INTRAVITREAL INJECTION OF AVASTIN ;  Surgeon: Jarold Mayo, MD;  Location: [facility] OR;  Service: Ophthalmology;  Laterality: Left;   REFRACTIVE SURGERY     UPPER GI ENDOSCOPY     several   VENTRICULOPERITONEAL SHUNT Right 06/06/2024   Procedure: SHUNT INSERTION VENTRICULAR-PERITONEAL;   Surgeon: Rosslyn Dino HERO, MD;  Location: [facility] OR;  Service: Neurosurgery;  Laterality: Right;   VITRECTOMY     Patient Active Problem List   Diagnosis Date Noted   NPH (normal pressure hydrocephalus) ([name]) 05/23/2024   Sleep-disordered breathing 12/31/2023   Actinic keratosis 11/30/2023   Vitamin D  deficiency 11/30/2023   Rhinitis 05/20/2023   Cervical spondylosis 08/26/2022   CKD (chronic kidney disease) stage 4, GFR 15-29 ml/min ([name]) 05/28/2021   Adjustment disorder 01/22/2021   Chronic back pain 10/22/2020   Peripheral vertigo 05/15/2020   Dermatitis 01/04/2020   Unintentional weight loss with loose stools 05/19/2019   Low vitamin B12 level 03/29/2019   Heme positive stool 03/14/2019   Normocytic anemia 03/14/2019   Chronic diastolic heart failure ([name]) 02/08/2019   Diabetic retinopathy ([name]) 09/08/2018   Physical debility 05/24/2018   Varicose veins of both lower extremities 03/01/2018   Fatigue due to exertion 10/14/2017   GERD (gastroesophageal reflux disease) 08/25/2017   CAD in native artery 04/14/2017   Hyperlipidemia associated with type 2 diabetes mellitus ([name]) 04/14/2017   Diabetic peripheral neuropathy associated with type 2 diabetes mellitus ([name]) 08/17/2016   [name] 05/02/2016   Hypertension associated with diabetes ([name]) 05/02/2016    PCP: Kennyth Worth HERO, MD   REFERRING  PROVIDER: Rosslyn Dino HERO, MD  REFERRING DIAG: G91.2 (ICD-10-CM) - NPH (normal pressure hydrocephalus) ([name])  THERAPY DIAG:  Muscle weakness (generalized)  Other abnormalities of gait and mobility  Difficulty in walking, not elsewhere classified  Rationale for Evaluation and Treatment: Rehabilitation  ONSET DATE: 06/06/2024  SUBJECTIVE:   SUBJECTIVE STATEMENT: Patient reports he feels exhausted today. He has not been moving around at home. Out of commission due to muscle relaxers.  Eval: Patients presents after having a entriculoperitoneal shunt  placements. His brother reports it is easier for him to get out of bed, he walking is smoother, and . Yesterday he showered independently with his shower chair. He negotiated the ledge to get out of the shower independently and with confidence. He presents today ambulating with his walker. He also has had improvements in his memory.  If his improvements plateau/ get stagnant surgeon will let more fluid out of his ventricles. This has to be done in increments.   From 11/07 MD Note: 68 year old gentleman with a past medical history significant for normal pressure hydrocephalus.  He underwent placement of a right-sided ventriculoperitoneal shunt programmed at 150. He returns today with his brother and states that he is doing much better: He is able to walk better, get in and out of bed better, and get in and out of his chair is better.  Cognitively he feels like he has far more energy than he used to.      PERTINENT HISTORY: evidence of lumbar compression deformity Compression fx L1 2022, CAD, CKD (stage 4), DM, cardiac arrest 2017, neuropathy, retinopathy Long history of gait abnormality beginning back in 2022. He has a history of CAD and believes that his debility began following a covid infection. In December odf 2022 he fell and suffered a compression fracture of L1.  PAIN:  Are you having pain? No and He has tubing in his abdomen that is sometimes uncomfortable. They move around which is normal.  PRECAUTIONS: Other: 5lb lifting restriction until cleared by surgeon   RED FLAGS: None   WEIGHT BEARING RESTRICTIONS: No  FALLS:  Has patient fallen in last 6 months? No  LIVING ENVIRONMENT: Lives with: lives with their family Lives in: House/apartment Stairs: No Has following equipment at home: Single point cane, Environmental Consultant - 2 wheeled, Environmental Consultant - 4 wheeled, shower chair, Grab bars, and and stand up walker also; also has shower chair at sink for shaving, brushing teeth etc, also has  transporter.  OCCUPATION: Retired  PLOF: Independent with household mobility with device, Requires assistive device for independence, Needs assistance with ADLs, and Brother assists with dressing; patient showers independently with shower chair  PATIENT GOALS: To be more independent; to not us  the walker  NEXT MD VISIT: July 12, 2024  OBJECTIVE:  Note: Objective measures were completed at Evaluation unless otherwise noted.  DIAGNOSTIC FINDINGS: None  PATIENT SURVEYS:  ABC scale: The Activities-Specific Balance Confidence (ABC) Scale 0% 10 20 30  40 50 60 70 80 90 100% No confidence<->completely confident  How confident are you that you will not lose your balance or become unsteady when you . . .   Date tested 06/23/2024  Walk around the house 80%  2. Walk up or down stairs 0%  3. Bend over and pick up a slipper from in front of a closet floor 0%  4. Reach for a small can off a shelf at eye level 0%  5. Stand on tip toes and reach for something above your head 0%  6. Stand on a chair and reach for something 0%  7. Sweep the floor 0%  8. Walk outside the house to a car parked in the driveway 50%  9. Get into or out of a car 80%  10. Walk across a parking lot to the mall 20%  11. Walk up or down a ramp 30%  12. Walk in a crowded mall where people rapidly walk past you 80%  13. Are bumped into by people as you walk through the mall 30%  14. Step onto or off of an escalator while you are holding onto the railing 0%  15. Step onto or off an escalator while holding onto parcels such that you cannot hold onto the railing 0%  16. Walk outside on icy sidewalks 0%  Total: 370/16 23.12%     COGNITION: Overall cognitive status: History of cognitive impairments - at baseline     SENSATION: Neuropathy    POSTURE: rounded shoulders and forward head   LOWER EXTREMITY ROM:WFL for tasks assessed    LOWER EXTREMITY MMT:  MMT Right eval Left eval  Hip flexion    Hip  extension    Hip abduction    Hip adduction    Hip internal rotation    Hip external rotation    Knee flexion    Knee extension    Ankle dorsiflexion    Ankle plantarflexion    Ankle inversion    Ankle eversion     (Blank rows = not tested)    FUNCTIONAL TESTS:  5 times sit to stand: 20.81 sec with UE support Timed up and go (TUG): 23.05 sec with rollator 3 minute walk test: 158ft with rollator; needed a seated rest break after 2:15 sec  GAIT: Distance walked: 119ft Assistive device utilized: Environmental Consultant - 4 wheeled Level of assistance: Modified independence Comments: decreased cadence                                                                                                                                 TREATMENT DATE:  08/02/24: Nu-Step seat 9, arms 10 L 3 while discussing status and response to treatment; 7 mins Gait with assistive device x 1 lap around cancer gym then one lap without assistive device with close wheelchair follow Patient was very fatigued after this and requested to be done    07/19/24: Pt able to stand unsupported to remove and later put on his coat Nu-Step seat 9, arms 10 L 5 while discussing status and response to treatment; 6 mins Gait without assistive device 108 feet with close follow with wheelchair and CGA with gait belt (2 standing rest breaks) min verbal cues for increased arm swing x 2 laps Seated LAQ 3# 10x Seated hip flexion 3# 10x Seated heel and toe raises 10x Seated biceps curl 2# DB x 10    07/14/24: Nu-Step seat 9, arms 10 L 5 while discussing status and response to treatment  Pt able to stand unsupported to remove and later put on his coat Gait without assistive device 100 feet with close follow with wheelchair and CGA with gait belt (2-3 standing rest breaks) min verbal cues for increased arm swing Sit to stand with cues to use armrests to rise and to guide sitting down no physical assist needed Seated LAQ 5# 10x Seated hip  flexion 5# 10x Seated heel and toe raises 10x Gait to waiting room no device 20 feet CGA  11/25: Nu-Step seat 9, arms 10 L 5 while discussing status and response to treatment Gait with RW 60 feet with upright posture and good toe clearance In // bars: gait without UE assist with turns without UE assist In // bars: stepping over canes/dowels on floor (light UE assist needed) In // bars: 4 inch step ups 7x bil UE assist needed Sit to stand 5x with min to mod UE assist needed Sit to stand from chair + foam pad NO UE assist needed 5x Gait without assistive device with close CGA 40 feet (pt's brother followed with RW for safety) RPE (Rating of Perceived Exertion):    7 /10    PATIENT EDUCATION:  Education details: PT eval findings, anticipated POC, progress with PT, and initial HEP  Person educated: Patient Education method: Explanation, Demonstration, and Handouts Education comprehension: verbalized understanding, returned demonstration, and needs further education  HOME EXERCISE PROGRAM: Access Code: [email or web address] URL: [email or web address] Date: 06/23/2024 Prepared by: Kristeen Sar  Exercises - Seated March  - 1 x daily - 7 x weekly - 1-2 sets - 10 reps - Seated Long Arc Quad  - 1 x daily - 7 x weekly - 1-2 sets - 10 reps - Heel Raises with Counter Support  - 1 x daily - 7 x weekly - 1-2 sets - 10 reps - Standing March with Counter Support  - 1 x daily - 7 x weekly - 1-2 sets - 10 reps - Standing Hip Abduction with Counter Support  - 1 x daily - 7 x weekly - 1-2 sets - 10 reps - Sit to Stand with Armchair  - 1 x daily - 7 x weekly - 1-2 sets - 5 reps  ASSESSMENT:  CLINICAL IMPRESSION: Patient presents with a small lapse in treatment due to issues trying to get his shunt correct and poor tolerance to muscle relaxers. He had increased fatigue today. He only tolerated walking two laps around the gym and then he was very fatigued. Patient requested to end session early due  to this. He was very apologetic due to this, but he did very well to have been sedentary for the last two weeks. With gait noted decreased step length, poor posture and decreased cadence since previous visits. His energy levels has not been the same since his like MD appointment. He only has to go to the MD once a week now.    Eval;Patient is a 68 y.o. male who was seen today for physical therapy evaluation and treatment for normal pressure hydrocephalus. Darrek is well known in this clinic from previous plan of care. On 10/27 he underwent surgery to place a ventriculoperitoneal shunt. His brother, [name], was present to discuss improvements noted since having the shunt place. His brother verbalized improvements in his bed mobility, posture, balance, and walking ability. We was able to walk for 2 minutes with his rollator today which is much improved compared to his previous standard of care. Based on evaluation noted muscle weakness, poor endurance, and increased  fall risk. Patient is highly motivated and wants to maintain independence. Patient will benefit from skilled PT to address the below impairments and improve overall function.   OBJECTIVE IMPAIRMENTS: Abnormal gait, decreased activity tolerance, decreased balance, decreased endurance, decreased mobility, difficulty walking, decreased ROM, decreased strength, increased muscle spasms, impaired flexibility, postural dysfunction, and pain.   ACTIVITY LIMITATIONS: carrying, lifting, bending, standing, squatting, transfers, bed mobility, bathing, toileting, dressing, reach over head, hygiene/grooming, and locomotion level  PARTICIPATION LIMITATIONS: cleaning, laundry, shopping, and community activity  PERSONAL FACTORS: Age, Fitness, Time since onset of injury/illness/exacerbation, and 3+ comorbidities: Compression fx L1 2022, CAD, CKD (stage 4), neuropathy, retinopathy are also affecting patient's functional outcome.   REHAB POTENTIAL:  Good  CLINICAL DECISION MAKING: Evolving/moderate complexity  EVALUATION COMPLEXITY: High   GOALS: Goals reviewed with patient? Yes  SHORT TERM GOALS: Target date: 07/21/2024 Patient will be independent with initial HEP. Baseline:  Goal status: INITIAL  2.  Patient will be able to participate in a without needed a seated rest break due to improved cardiovascular endurance. Baseline:  Goal status: met 12/4    LONG TERM GOALS: Target date: 08/18/2024 Patient will demonstrate independence in advanced HEP. Baseline:  Goal status: INITIAL  2.  Patient will be able to participate in a 6 MWT to improve community negotiation and functional mobility. Baseline:  Goal status: INITIAL  3.  Patient will be able to stand to brush his teeth and wash his face due to improved standing tolerance. Baseline:  Goal status: INITIAL   4.  Patient will perform 5STS in < or = to 16 sec due to improved LE strength and functional mobility. Baseline: 20.81 sec Goal status: INITIAL  5.  Patient will perform TUG in < or = to 18sec to decrease falls risk. Baseline: 23.05 Goal status: INITIAL  6.  Patient will demonstrate improved LE mobility needed for greater ease with putting on socks and shoes in sitting . Baseline:  Goal status: INITIAL   PLAN:  PT FREQUENCY: 2x/week  PT DURATION: 8 weeks  PLANNED INTERVENTIONS: [id number]- PT Re-evaluation, 97110-Therapeutic exercises, 97530- Therapeutic activity, 97112- Neuromuscular re-education, 97535- Self Care, [id number]- Manual therapy, [phone]- Gait training, [phone]- Canalith repositioning, [id number]- Aquatic Therapy, [phone]- Electrical stimulation (unattended), [phone]- Electrical stimulation (manual), [id number]- Vasopneumatic device, [id number]- Ultrasound, 79439 (1-2 muscles), 20561 (3+ muscles)- Dry Needling, Patient/Family education, Balance training, Stair training, Taping, Joint mobilization, Joint manipulation, Spinal manipulation, Spinal mobilization, Vestibular  training, Cryotherapy, and Moist heat  PLAN FOR NEXT SESSION: 5lb lifting restriction continues; ask about energy levels; standing/ walking;  gait without the RW have 2nd person follow with WC in case of overfatigue;  strength; balance;  sit to stand    Kristeen Sar, PT, DPT 08/02/2024 4:05 PM [facility] [address], [address] [place], [place] [address] Phone # [phone] Fax [phone]   "

## 2024-08-08 ENCOUNTER — Ambulatory Visit (HOSPITAL_COMMUNITY)
Admission: RE | Admit: 2024-08-08 | Discharge: 2024-08-08 | Disposition: A | Discharge status: Home / Self Care | Source: Ambulatory Visit | Attending: Internal Medicine | Admitting: Internal Medicine

## 2024-08-08 DIAGNOSIS — Z01810 Encounter for preprocedural cardiovascular examination: Secondary | ICD-10-CM | POA: Insufficient documentation

## 2024-08-08 LAB — ECHOCARDIOGRAM COMPLETE
Area-P 1/2: 2.46 cm2
P 1/2 time: 447 ms
S' Lateral: 2.7 cm

## 2024-08-09 ENCOUNTER — Ambulatory Visit: Admitting: Physical Therapy

## 2024-08-09 ENCOUNTER — Encounter (HOSPITAL_BASED_OUTPATIENT_CLINIC_OR_DEPARTMENT_OTHER): Payer: Self-pay | Admitting: Cardiovascular Disease

## 2024-08-10 ENCOUNTER — Encounter: Payer: Self-pay | Admitting: Family Medicine

## 2024-08-11 ENCOUNTER — Ambulatory Visit: Payer: Self-pay | Admitting: Student

## 2024-08-12 ENCOUNTER — Encounter (HOSPITAL_BASED_OUTPATIENT_CLINIC_OR_DEPARTMENT_OTHER): Payer: Self-pay | Admitting: Cardiovascular Disease

## 2024-08-12 ENCOUNTER — Ambulatory Visit: Attending: Neurosurgery | Admitting: Physical Therapy

## 2024-08-12 ENCOUNTER — Encounter: Payer: Self-pay | Admitting: Physical Therapy

## 2024-08-12 DIAGNOSIS — M6281 Muscle weakness (generalized): Secondary | ICD-10-CM | POA: Insufficient documentation

## 2024-08-12 DIAGNOSIS — R262 Difficulty in walking, not elsewhere classified: Secondary | ICD-10-CM | POA: Insufficient documentation

## 2024-08-12 DIAGNOSIS — R2689 Other abnormalities of gait and mobility: Secondary | ICD-10-CM | POA: Diagnosis present

## 2024-08-12 MED ORDER — AZELASTINE HCL 0.1 % NA SOLN: 2.0000 | Freq: Two times a day (BID) | NASAL | 12 refills | Status: AC

## 2024-08-12 NOTE — Therapy (Signed)
 " OUTPATIENT PHYSICAL THERAPY LOWER EXTREMITY PROGRESS NOTE   Patient Name: Jeffrey [name] MRN: [id number] DOB:September 04, 1955, 69 y.o., male Today's Date: 08/12/2024  END OF SESSION:  PT End of Session - 08/12/24 1024     Visit Number 6    Date for Recertification  08/18/24    Authorization Type HTA    Progress Note Due on Visit 10    PT Start Time 1017    PT Stop Time 1100    PT Time Calculation (min) 43 min    Activity Tolerance Patient tolerated treatment well            Past Medical History:  Diagnosis Date   Cardiac arrest ([name]) 05/22/2016   Cataract    CHF (congestive heart failure) ([name])    CKD (chronic kidney disease) stage 3, GFR 30-59 ml/min ([name]) 05/28/2021   CKD (chronic kidney disease) stage 4, GFR 15-29 ml/min ([name]) 05/28/2021   Coronary artery disease    LAD PCI   Diabetes mellitus without complication ([name])    type 2   Diabetic retinopathy ([name])    Hyperlipidemia    Hypertension    Memory loss    mild   Myocardial infarct ([name]) 2105   Retinopathy    Both eyes   S/P primary angioplasty with coronary stent 05/22/2016   Sleep-disordered breathing 12/31/2023   Vitamin B12 deficiency    Vitreous hemorrhage of left eye ([name])    proliferative diabetic retinopathy   Past Surgical History:  Procedure Laterality Date   ANGIOPLASTY     2015   COLONOSCOPY     multiple   EYE SURGERY     PARS PLANA VITRECTOMY Left 03/15/2020   Procedure: PARS PLANA VITRECTOMY WITH 25 GAUGE;  Surgeon: Jarold Mayo, MD;  Location: [facility] OR;  Service: Ophthalmology;  Laterality: Left;   PHOTOCOAGULATION WITH LASER Left 03/15/2020   Procedure: PHOTOCOAGULATION WITH LASER; INTRAVITREAL INJECTION OF AVASTIN ;  Surgeon: Jarold Mayo, MD;  Location: [facility] OR;  Service: Ophthalmology;  Laterality: Left;   REFRACTIVE SURGERY     UPPER GI ENDOSCOPY     several   VENTRICULOPERITONEAL SHUNT Right 06/06/2024   Procedure: SHUNT INSERTION VENTRICULAR-PERITONEAL;  Surgeon: Rosslyn Dino HERO, MD;   Location: [facility] OR;  Service: Neurosurgery;  Laterality: Right;   VITRECTOMY     Patient Active Problem List   Diagnosis Date Noted   NPH (normal pressure hydrocephalus) ([name]) 05/23/2024   Sleep-disordered breathing 12/31/2023   Actinic keratosis 11/30/2023   Vitamin D  deficiency 11/30/2023   Rhinitis 05/20/2023   Cervical spondylosis 08/26/2022   CKD (chronic kidney disease) stage 4, GFR 15-29 ml/min ([name]) 05/28/2021   Adjustment disorder 01/22/2021   Chronic back pain 10/22/2020   Peripheral vertigo 05/15/2020   Dermatitis 01/04/2020   Unintentional weight loss with loose stools 05/19/2019   Low vitamin B12 level 03/29/2019   Heme positive stool 03/14/2019   Normocytic anemia 03/14/2019   Chronic diastolic heart failure ([name]) 02/08/2019   Diabetic retinopathy ([name]) 09/08/2018   Physical debility 05/24/2018   Varicose veins of both lower extremities 03/01/2018   Fatigue due to exertion 10/14/2017   GERD (gastroesophageal reflux disease) 08/25/2017   CAD in native artery 04/14/2017   Hyperlipidemia associated with type 2 diabetes mellitus ([name]) 04/14/2017   Diabetic peripheral neuropathy associated with type 2 diabetes mellitus ([name]) 08/17/2016   [name] 05/02/2016   Hypertension associated with diabetes ([name]) 05/02/2016    PCP: Kennyth Worth HERO, MD   REFERRING PROVIDER:  Rosslyn Dino HERO, MD  REFERRING DIAG: G91.2 (ICD-10-CM) - NPH (normal pressure hydrocephalus) ([name])  THERAPY DIAG:  Muscle weakness (generalized)  Other abnormalities of gait and mobility  Difficulty in walking, not elsewhere classified  Rationale for Evaluation and Treatment: Rehabilitation  ONSET DATE: 06/06/2024  SUBJECTIVE:   SUBJECTIVE STATEMENT: Next fluid adjustment 09/09/24.  States he's feeling good today.  Able to shave myself, shower by myself.  I'm so thankful for that surgery.    Eval: Patients presents after having a entriculoperitoneal shunt placements.  His brother reports it is easier for him to get out of bed, he walking is smoother, and . Yesterday he showered independently with his shower chair. He negotiated the ledge to get out of the shower independently and with confidence. He presents today ambulating with his walker. He also has had improvements in his memory.  If his improvements plateau/ get stagnant surgeon will let more fluid out of his ventricles. This has to be done in increments.   From 11/07 MD Note: 69 year old gentleman with a past medical history significant for normal pressure hydrocephalus.  He underwent placement of a right-sided ventriculoperitoneal shunt programmed at 150. He returns today with his brother and states that he is doing much better: He is able to walk better, get in and out of bed better, and get in and out of his chair is better.  Cognitively he feels like he has far more energy than he used to.      PERTINENT HISTORY: evidence of lumbar compression deformity Compression fx L1 2022, CAD, CKD (stage 4), DM, cardiac arrest 2017, neuropathy, retinopathy Long history of gait abnormality beginning back in 2022. He has a history of CAD and believes that his debility began following a covid infection. In December odf 2022 he fell and suffered a compression fracture of L1.  PAIN:  Are you having pain? No and He has tubing in his abdomen that is sometimes uncomfortable. They move around which is normal.  PRECAUTIONS: Other: 5lb lifting restriction until cleared by surgeon   RED FLAGS: None   WEIGHT BEARING RESTRICTIONS: No  FALLS:  Has patient fallen in last 6 months? No  LIVING ENVIRONMENT: Lives with: lives with their family Lives in: House/apartment Stairs: No Has following equipment at home: Single point cane, Environmental Consultant - 2 wheeled, Environmental Consultant - 4 wheeled, shower chair, Grab bars, and and stand up walker also; also has shower chair at sink for shaving, brushing teeth etc, also has transporter.  OCCUPATION:  Retired  PLOF: Independent with household mobility with device, Requires assistive device for independence, Needs assistance with ADLs, and Brother assists with dressing; patient showers independently with shower chair  PATIENT GOALS: To be more independent; to not us  the walker  NEXT MD VISIT: July 12, 2024  OBJECTIVE:  Note: Objective measures were completed at Evaluation unless otherwise noted.  DIAGNOSTIC FINDINGS: None  PATIENT SURVEYS:  ABC scale: The Activities-Specific Balance Confidence (ABC) Scale 0% 10 20 30  40 50 60 70 80 90 100% No confidence<->completely confident  How confident are you that you will not lose your balance or become unsteady when you . . .   Date tested 06/23/2024  Walk around the house 80%  2. Walk up or down stairs 0%  3. Bend over and pick up a slipper from in front of a closet floor 0%  4. Reach for a small can off a shelf at eye level 0%  5. Stand on tip toes and reach for something  above your head 0%  6. Stand on a chair and reach for something 0%  7. Sweep the floor 0%  8. Walk outside the house to a car parked in the driveway 50%  9. Get into or out of a car 80%  10. Walk across a parking lot to the mall 20%  11. Walk up or down a ramp 30%  12. Walk in a crowded mall where people rapidly walk past you 80%  13. Are bumped into by people as you walk through the mall 30%  14. Step onto or off of an escalator while you are holding onto the railing 0%  15. Step onto or off an escalator while holding onto parcels such that you cannot hold onto the railing 0%  16. Walk outside on icy sidewalks 0%  Total: 370/16 23.12%     COGNITION: Overall cognitive status: History of cognitive impairments - at baseline     SENSATION: Neuropathy    POSTURE: rounded shoulders and forward head   LOWER EXTREMITY ROM:WFL for tasks assessed    LOWER EXTREMITY MMT:  MMT Right eval Left eval  Hip flexion    Hip extension    Hip abduction    Hip  adduction    Hip internal rotation    Hip external rotation    Knee flexion    Knee extension    Ankle dorsiflexion    Ankle plantarflexion    Ankle inversion    Ankle eversion     (Blank rows = not tested)    FUNCTIONAL TESTS:  5 times sit to stand: 20.81 sec with UE support Timed up and go (TUG): 23.05 sec with rollator 3 minute walk test: 135ft with rollator; needed a seated rest break after 2:15 sec  GAIT: Distance walked: 115ft Assistive device utilized: Environmental Consultant - 4 wheeled Level of assistance: Modified independence Comments: decreased cadence                                                                                                                                 TREATMENT DATE:  08/12/2024: Nu-Step seat 10, arms 10 L 5 while discussing status and response to treatment; 5 mins Gait with assistive device x 1 lap around cancer gym then one lap without assistive device with close wheelchair follow 75 feet Gait without assistive 85 feet with close wheelchair to follow with several times of reaching for the wall or objects;  1 episode of loss of balance but patient able to self re-cover; therapist providing close CGA and verbal cues for standing up tall, arm swing Gait instruction with single point cane 109 feet x2 Instruction in setting cane height; pt plans on getting one [facility]  Gait with single point cane down a ramp (slowly) with CGA Car transfer using cane with SBA RPE 8/10   08/02/24: Nu-Step seat 9, arms 10 L 3 while discussing status and response to treatment; 7 mins  Gait with assistive device x 1 lap around cancer gym then one lap without assistive device with close wheelchair follow Patient was very fatigued after this and requested to be done    07/19/24: Pt able to stand unsupported to remove and later put on his coat Nu-Step seat 9, arms 10 L 5 while discussing status and response to treatment; 6 mins Gait without assistive device 108 feet  with close follow with wheelchair and CGA with gait belt (2 standing rest breaks) min verbal cues for increased arm swing x 2 laps Seated LAQ 3# 10x Seated hip flexion 3# 10x Seated heel and toe raises 10x Seated biceps curl 2# DB x 10    07/14/24: Nu-Step seat 9, arms 10 L 5 while discussing status and response to treatment Pt able to stand unsupported to remove and later put on his coat Gait without assistive device 100 feet with close follow with wheelchair and CGA with gait belt (2-3 standing rest breaks) min verbal cues for increased arm swing Sit to stand with cues to use armrests to rise and to guide sitting down no physical assist needed Seated LAQ 5# 10x Seated hip flexion 5# 10x Seated heel and toe raises 10x Gait to waiting room no device 20 feet CGA  11/25: Nu-Step seat 9, arms 10 L 5 while discussing status and response to treatment Gait with RW 60 feet with upright posture and good toe clearance In // bars: gait without UE assist with turns without UE assist In // bars: stepping over canes/dowels on floor (light UE assist needed) In // bars: 4 inch step ups 7x bil UE assist needed Sit to stand 5x with min to mod UE assist needed Sit to stand from chair + foam pad NO UE assist needed 5x Gait without assistive device with close CGA 40 feet (pt's brother followed with RW for safety) RPE (Rating of Perceived Exertion):    7 /10    PATIENT EDUCATION:  Education details: PT eval findings, anticipated POC, progress with PT, and initial HEP  Person educated: Patient Education method: Explanation, Demonstration, and Handouts Education comprehension: verbalized understanding, returned demonstration, and needs further education  HOME EXERCISE PROGRAM: Access Code: [email or web address] URL: [email or web address] Date: 06/23/2024 Prepared by: Kristeen Sar  Exercises - Seated March  - 1 x daily - 7 x weekly - 1-2 sets - 10 reps - Seated Long Arc Quad  - 1 x daily - 7 x  weekly - 1-2 sets - 10 reps - Heel Raises with Counter Support  - 1 x daily - 7 x weekly - 1-2 sets - 10 reps - Standing March with Counter Support  - 1 x daily - 7 x weekly - 1-2 sets - 10 reps - Standing Hip Abduction with Counter Support  - 1 x daily - 7 x weekly - 1-2 sets - 10 reps - Sit to Stand with Armchair  - 1 x daily - 7 x weekly - 1-2 sets - 5 reps  ASSESSMENT:  CLINICAL IMPRESSION: Without an assistive device, Jeffrey [name] fatigues quickly and often reaches for the walls or objects for support.  He was instructed in use of a single point cane including sequencing.  Noted much improved posture and gait speed and with the cane is able to walk much further before onset of fatigue.  Close CGA with descending the ramp with the cane.  He is very interested in purchasing a cane from the medical supply store and we discussed the appropriate height settings.  Eval;Patient is a 69 y.o. male who was seen today for physical therapy evaluation and treatment for normal pressure hydrocephalus. Jeffrey [name] is well known in this clinic from previous plan of care. On 10/27 he underwent surgery to place a ventriculoperitoneal shunt. His brother, [name], was present to discuss improvements noted since having the shunt place. His brother verbalized improvements in his bed mobility, posture, balance, and walking ability. We was able to walk for 2 minutes with his rollator today which is much improved compared to his previous standard of care. Based on evaluation noted muscle weakness, poor endurance, and increased fall risk. Patient is highly motivated and wants to maintain independence. Patient will benefit from skilled PT to address the below impairments and improve overall function.   OBJECTIVE IMPAIRMENTS: Abnormal gait, decreased activity tolerance, decreased balance, decreased endurance, decreased mobility, difficulty walking, decreased ROM, decreased strength, increased muscle spasms, impaired flexibility,  postural dysfunction, and pain.   ACTIVITY LIMITATIONS: carrying, lifting, bending, standing, squatting, transfers, bed mobility, bathing, toileting, dressing, reach over head, hygiene/grooming, and locomotion level  PARTICIPATION LIMITATIONS: cleaning, laundry, shopping, and community activity  PERSONAL FACTORS: Age, Fitness, Time since onset of injury/illness/exacerbation, and 3+ comorbidities: Compression fx L1 2022, CAD, CKD (stage 4), neuropathy, retinopathy are also affecting patient's functional outcome.   REHAB POTENTIAL: Good  CLINICAL DECISION MAKING: Evolving/moderate complexity  EVALUATION COMPLEXITY: High   GOALS: Goals reviewed with patient? Yes  SHORT TERM GOALS: Target date: 07/21/2024 Patient will be independent with initial HEP. Baseline:  Goal status: met 1/2  2.  Patient will be able to participate in a without needed a seated rest break due to improved cardiovascular endurance. Baseline:  Goal status: met 12/4    LONG TERM GOALS: Target date: 08/18/2024 Patient will demonstrate independence in advanced HEP. Baseline:  Goal status: INITIAL  2.  Patient will be able to participate in a 6 MWT to improve community negotiation and functional mobility. Baseline:  Goal status: INITIAL  3.  Patient will be able to stand to brush his teeth and wash his face due to improved standing tolerance. Baseline:  Goal status: met 1/2   4.  Patient will perform 5STS in < or = to 16 sec due to improved LE strength and functional mobility. Baseline: 20.81 sec Goal status: INITIAL  5.  Patient will perform TUG in < or = to 18sec to decrease falls risk. Baseline: 23.05 Goal status: INITIAL  6.  Patient will demonstrate improved LE mobility needed for greater ease with putting on socks and shoes in sitting . Baseline:  Goal status: INITIAL   PLAN:  PT FREQUENCY: 2x/week  PT DURATION: 8 weeks  PLANNED INTERVENTIONS: [id number]- PT Re-evaluation,  97110-Therapeutic exercises, 97530- Therapeutic activity, 97112- Neuromuscular re-education, 97535- Self Care, [id number]- Manual therapy, [phone]- Gait training, [phone]- Canalith repositioning, [phone]- Aquatic Therapy, [phone]- Electrical stimulation (unattended), [phone]- Electrical stimulation (manual), [id number]- Vasopneumatic device, [id number]- Ultrasound, 79439 (1-2 muscles), [id number] (3+ muscles)- Dry Needling, Patient/Family education, Balance training, Stair training, Taping, Joint mobilization, Joint manipulation, Spinal manipulation, Spinal mobilization, Vestibular training, Cryotherapy, and Moist heat  PLAN FOR NEXT SESSION: 5lb lifting restriction continues; ERO next week; 6 MWT with single point cane;  2nd person follow with WC in case of overfatigue; TUG, 5x STS strength; balance;  sit to stand   Glade Pesa, PT 08/12/2024 11:39 AM Phone: [phone] Fax: [phone]  [facility] [address], [address] [place], [place] [address] Phone # [phone] Fax [phone]   "

## 2024-08-13 ENCOUNTER — Other Ambulatory Visit: Payer: Self-pay | Admitting: Neurosurgery

## 2024-08-15 NOTE — Telephone Encounter (Signed)
 Patient discontinued medication due to side-effects. Dr. Janjua is aware.   I called this morning and confirmed that he was not taking it still.

## 2024-08-16 ENCOUNTER — Ambulatory Visit: Admitting: Physical Therapy

## 2024-08-16 ENCOUNTER — Encounter: Payer: Self-pay | Admitting: Physical Therapy

## 2024-08-16 DIAGNOSIS — R262 Difficulty in walking, not elsewhere classified: Secondary | ICD-10-CM

## 2024-08-16 DIAGNOSIS — M6281 Muscle weakness (generalized): Secondary | ICD-10-CM | POA: Diagnosis not present

## 2024-08-16 DIAGNOSIS — R2689 Other abnormalities of gait and mobility: Secondary | ICD-10-CM

## 2024-08-16 NOTE — Therapy (Signed)
 " OUTPATIENT PHYSICAL THERAPY LOWER EXTREMITY PROGRESS NOTE   Patient Name: Jeffrey [name] MRN: [id number] DOB:[birth year], 69 y.o., male Today's Date: 08/16/2024  END OF SESSION:  PT End of Session - 08/16/24 1530     Visit Number 7    Date for Recertification  08/18/24    Authorization Type HTA    Progress Note Due on Visit 10    PT Start Time 1530    PT Stop Time 1613    PT Time Calculation (min) 43 min    Equipment Utilized During Treatment Gait belt    Activity Tolerance Patient tolerated treatment well            Past Medical History:  Diagnosis Date   Cardiac arrest ([name]) 05/22/2016   Cataract    CHF (congestive heart failure) ([name])    CKD (chronic kidney disease) stage 3, GFR 30-59 ml/min ([name]) 05/28/2021   CKD (chronic kidney disease) stage 4, GFR 15-29 ml/min ([name]) 05/28/2021   Coronary artery disease    LAD PCI   Diabetes mellitus without complication ([name])    type 2   Diabetic retinopathy ([name])    Hyperlipidemia    Hypertension    Memory loss    mild   Myocardial infarct ([name]) 2105   Retinopathy    Both eyes   S/P primary angioplasty with coronary stent 05/22/2016   Sleep-disordered breathing 12/31/2023   Vitamin B12 deficiency    Vitreous hemorrhage of left eye ([name])    proliferative diabetic retinopathy   Past Surgical History:  Procedure Laterality Date   ANGIOPLASTY     2015   COLONOSCOPY     multiple   EYE SURGERY     PARS PLANA VITRECTOMY Left 03/15/2020   Procedure: PARS PLANA VITRECTOMY WITH 25 GAUGE;  Surgeon: Jarold Mayo, MD;  Location: [facility] OR;  Service: Ophthalmology;  Laterality: Left;   PHOTOCOAGULATION WITH LASER Left 03/15/2020   Procedure: PHOTOCOAGULATION WITH LASER; INTRAVITREAL INJECTION OF AVASTIN ;  Surgeon: Jarold Mayo, MD;  Location: [facility] OR;  Service: Ophthalmology;  Laterality: Left;   REFRACTIVE SURGERY     UPPER GI ENDOSCOPY     several   VENTRICULOPERITONEAL SHUNT Right 06/06/2024   Procedure: SHUNT INSERTION  VENTRICULAR-PERITONEAL;  Surgeon: Rosslyn Dino HERO, MD;  Location: [facility] OR;  Service: Neurosurgery;  Laterality: Right;   VITRECTOMY     Patient Active Problem List   Diagnosis Date Noted   NPH (normal pressure hydrocephalus) ([name]) 05/23/2024   Sleep-disordered breathing 12/31/2023   Actinic keratosis 11/30/2023   Vitamin D  deficiency 11/30/2023   Rhinitis 05/20/2023   Cervical spondylosis 08/26/2022   CKD (chronic kidney disease) stage 4, GFR 15-29 ml/min ([name]) 05/28/2021   Adjustment disorder 01/22/2021   Chronic back pain 10/22/2020   Peripheral vertigo 05/15/2020   Dermatitis 01/04/2020   Unintentional weight loss with loose stools 05/19/2019   Low vitamin B12 level 03/29/2019   Heme positive stool 03/14/2019   Normocytic anemia 03/14/2019   Chronic diastolic heart failure ([name]) 02/08/2019   Diabetic retinopathy ([name]) 09/08/2018   Physical debility 05/24/2018   Varicose veins of both lower extremities 03/01/2018   Fatigue due to exertion 10/14/2017   GERD (gastroesophageal reflux disease) 08/25/2017   CAD in native artery 04/14/2017   Hyperlipidemia associated with type 2 diabetes mellitus ([name]) 04/14/2017   Diabetic peripheral neuropathy associated with type 2 diabetes mellitus ([name]) 08/17/2016   [name] 05/02/2016   Hypertension associated with diabetes ([name]) 05/02/2016  PCP: Kennyth Worth HERO, MD   REFERRING PROVIDER: Rosslyn Dino HERO, MD  REFERRING DIAG: G91.2 (ICD-10-CM) - NPH (normal pressure hydrocephalus) ([name])  THERAPY DIAG:  Muscle weakness (generalized)  Other abnormalities of gait and mobility  Difficulty in walking, not elsewhere classified  Rationale for Evaluation and Treatment: Rehabilitation  ONSET DATE: 06/06/2024  SUBJECTIVE:   SUBJECTIVE STATEMENT: Arrives with SPC.  Today is the first day I've used the cane out of the house.  Using the cane more than walker now at home.   Next fluid adjustment 09/09/24.    Eval: Patients presents after having a entriculoperitoneal shunt placements. His brother reports it is easier for him to get out of bed, he walking is smoother, and . Yesterday he showered independently with his shower chair. He negotiated the ledge to get out of the shower independently and with confidence. He presents today ambulating with his walker. He also has had improvements in his memory.  If his improvements plateau/ get stagnant surgeon will let more fluid out of his ventricles. This has to be done in increments.   From 11/07 MD Note: 69 year old gentleman with a past medical history significant for normal pressure hydrocephalus.  He underwent placement of a right-sided ventriculoperitoneal shunt programmed at 150. He returns today with his brother and states that he is doing much better: He is able to walk better, get in and out of bed better, and get in and out of his chair is better.  Cognitively he feels like he has far more energy than he used to.      PERTINENT HISTORY: evidence of lumbar compression deformity Compression fx L1 2022, CAD, CKD (stage 4), DM, cardiac arrest 2017, neuropathy, retinopathy Long history of gait abnormality beginning back in 2022. He has a history of CAD and believes that his debility began following a covid infection. In December odf 2022 he fell and suffered a compression fracture of L1.  PAIN:  Are you having pain? No and He has tubing in his abdomen that is sometimes uncomfortable. They move around which is normal.  PRECAUTIONS: Other: 5lb lifting restriction until cleared by surgeon   RED FLAGS: None   WEIGHT BEARING RESTRICTIONS: No  FALLS:  Has patient fallen in last 6 months? No  LIVING ENVIRONMENT: Lives with: lives with their family Lives in: House/apartment Stairs: No Has following equipment at home: Single point cane, Environmental Consultant - 2 wheeled, Environmental Consultant - 4 wheeled, shower chair, Grab bars, and and stand up walker also; also has shower  chair at sink for shaving, brushing teeth etc, also has transporter.  OCCUPATION: Retired  PLOF: Independent with household mobility with device, Requires assistive device for independence, Needs assistance with ADLs, and Brother assists with dressing; patient showers independently with shower chair  PATIENT GOALS: To be more independent; to not us  the walker  NEXT MD VISIT: July 12, 2024  OBJECTIVE:  Note: Objective measures were completed at Evaluation unless otherwise noted.  DIAGNOSTIC FINDINGS: None  PATIENT SURVEYS:  ABC scale: The Activities-Specific Balance Confidence (ABC) Scale 0% 10 20 30  40 50 60 70 80 90 100% No confidence<->completely confident  How confident are you that you will not lose your balance or become unsteady when you . . .   Date tested 06/23/2024  Walk around the house 80%  2. Walk up or down stairs 0%  3. Bend over and pick up a slipper from in front of a closet floor 0%  4. Reach for a small can off  a shelf at eye level 0%  5. Stand on tip toes and reach for something above your head 0%  6. Stand on a chair and reach for something 0%  7. Sweep the floor 0%  8. Walk outside the house to a car parked in the driveway 50%  9. Get into or out of a car 80%  10. Walk across a parking lot to the mall 20%  11. Walk up or down a ramp 30%  12. Walk in a crowded mall where people rapidly walk past you 80%  13. Are bumped into by people as you walk through the mall 30%  14. Step onto or off of an escalator while you are holding onto the railing 0%  15. Step onto or off an escalator while holding onto parcels such that you cannot hold onto the railing 0%  16. Walk outside on icy sidewalks 0%  Total: 370/16 23.12%     COGNITION: Overall cognitive status: History of cognitive impairments - at baseline     SENSATION: Neuropathy    POSTURE: rounded shoulders and forward head   LOWER EXTREMITY ROM:WFL for tasks assessed    LOWER EXTREMITY  MMT:  MMT Right eval Left eval  Hip flexion    Hip extension    Hip abduction    Hip adduction    Hip internal rotation    Hip external rotation    Knee flexion    Knee extension    Ankle dorsiflexion    Ankle plantarflexion    Ankle inversion    Ankle eversion     (Blank rows = not tested)    FUNCTIONAL TESTS:  5 times sit to stand: 20.81 sec with UE support Timed up and go (TUG): 23.05 sec with rollator 3 minute walk test: 183ft with rollator; needed a seated rest break after 2:15 sec  GAIT: Distance walked: 129ft Assistive device utilized: Environmental Consultant - 4 wheeled Level of assistance: Modified independence Comments: decreased cadence                                                                                                                                 TREATMENT DATE:  08/16/2024: Adjusted cane to be higher (a little low) Nu-Step seat 10, arms 10 L 5  5 min while discussing status and response to treatment Gait 276 feet with SPC: verbal cues for gait pattern to optimize safety and efficiency Freeform standing 3# rows 15x Freeform 3# standing biceps 8x Standing 2# snatch to overhead press 8x right/left Seated 2# hip flexion/abduction over a weight on floor 10x right/left RPE 6/10  08/12/2024: Nu-Step seat 10, arms 10 L 5 while discussing status and response to treatment; 5 mins Gait with assistive device x 1 lap around cancer gym then one lap without assistive device with close wheelchair follow 75 feet Gait without assistive 85 feet with close wheelchair to follow with several times of reaching for the wall  or objects;  1 episode of loss of balance but patient able to self re-cover; therapist providing close CGA and verbal cues for standing up tall, arm swing Gait instruction with single point cane 109 feet x2 Instruction in setting cane height; pt plans on getting one from [facility]  Gait with single point cane down a ramp (slowly) with CGA Car transfer using  cane with SBA RPE 8/10   08/02/24: Nu-Step seat 9, arms 10 L 3 while discussing status and response to treatment; 7 mins Gait with assistive device x 1 lap around cancer gym then one lap without assistive device with close wheelchair follow Patient was very fatigued after this and requested to be done    07/19/24: Pt able to stand unsupported to remove and later put on his coat Nu-Step seat 9, arms 10 L 5 while discussing status and response to treatment; 6 mins Gait without assistive device 108 feet with close follow with wheelchair and CGA with gait belt (2 standing rest breaks) min verbal cues for increased arm swing x 2 laps Seated LAQ 3# 10x Seated hip flexion 3# 10x Seated heel and toe raises 10x Seated biceps curl 2# DB x 10    07/14/24: Nu-Step seat 9, arms 10 L 5 while discussing status and response to treatment Pt able to stand unsupported to remove and later put on his coat Gait without assistive device 100 feet with close follow with wheelchair and CGA with gait belt (2-3 standing rest breaks) min verbal cues for increased arm swing Sit to stand with cues to use armrests to rise and to guide sitting down no physical assist needed Seated LAQ 5# 10x Seated hip flexion 5# 10x Seated heel and toe raises 10x Gait to waiting room no device 20 feet CGA  11/25: Nu-Step seat 9, arms 10 L 5 while discussing status and response to treatment Gait with RW 60 feet with upright posture and good toe clearance In // bars: gait without UE assist with turns without UE assist In // bars: stepping over canes/dowels on floor (light UE assist needed) In // bars: 4 inch step ups 7x bil UE assist needed Sit to stand 5x with min to mod UE assist needed Sit to stand from chair + foam pad NO UE assist needed 5x Gait without assistive device with close CGA 40 feet (pt's brother followed with RW for safety) RPE (Rating of Perceived Exertion):    7 /10    PATIENT EDUCATION:  Education  details: PT eval findings, anticipated POC, progress with PT, and initial HEP  Person educated: Patient Education method: Explanation, Demonstration, and Handouts Education comprehension: verbalized understanding, returned demonstration, and needs further education  HOME EXERCISE PROGRAM: Access Code: [email or web address] URL: [email or web address] Date: 06/23/2024 Prepared by: Kristeen Sar  Exercises - Seated March  - 1 x daily - 7 x weekly - 1-2 sets - 10 reps - Seated Long Arc Quad  - 1 x daily - 7 x weekly - 1-2 sets - 10 reps - Heel Raises with Counter Support  - 1 x daily - 7 x weekly - 1-2 sets - 10 reps - Standing March with Counter Support  - 1 x daily - 7 x weekly - 1-2 sets - 10 reps - Standing Hip Abduction with Counter Support  - 1 x daily - 7 x weekly - 1-2 sets - 10 reps - Sit to Stand with Armchair  - 1 x daily - 7 x weekly - 1-2 sets - 5  reps  ASSESSMENT:  CLINICAL IMPRESSION: Able to steadily increase exercise capacity including walking distance with single point cane.  Despite the progression of exercise intensity he rates his level of perceived exertion lower than last visit.  Resistance continued to be < 5 pound per lifting limit restriction.  Close CGA/supervision for safety.  He is able to perform static standing upper body ex's without loss of balance.      Eval;Patient is a 69 y.o. male who was seen today for physical therapy evaluation and treatment for normal pressure hydrocephalus. Jeffrey [name] is well known in this clinic from previous plan of care. On 10/27 he underwent surgery to place a ventriculoperitoneal shunt. His brother, [name], was present to discuss improvements noted since having the shunt place. His brother verbalized improvements in his bed mobility, posture, balance, and walking ability. We was able to walk for 2 minutes with his rollator today which is much improved compared to his previous standard of care. Based on evaluation noted muscle weakness,  poor endurance, and increased fall risk. Patient is highly motivated and wants to maintain independence. Patient will benefit from skilled PT to address the below impairments and improve overall function.   OBJECTIVE IMPAIRMENTS: Abnormal gait, decreased activity tolerance, decreased balance, decreased endurance, decreased mobility, difficulty walking, decreased ROM, decreased strength, increased muscle spasms, impaired flexibility, postural dysfunction, and pain.   ACTIVITY LIMITATIONS: carrying, lifting, bending, standing, squatting, transfers, bed mobility, bathing, toileting, dressing, reach over head, hygiene/grooming, and locomotion level  PARTICIPATION LIMITATIONS: cleaning, laundry, shopping, and community activity  PERSONAL FACTORS: Age, Fitness, Time since onset of injury/illness/exacerbation, and 3+ comorbidities: Compression fx L1 2022, CAD, CKD (stage 4), neuropathy, retinopathy are also affecting patient's functional outcome.   REHAB POTENTIAL: Good  CLINICAL DECISION MAKING: Evolving/moderate complexity  EVALUATION COMPLEXITY: High   GOALS: Goals reviewed with patient? Yes  SHORT TERM GOALS: Target date: 07/21/2024 Patient will be independent with initial HEP. Baseline:  Goal status: met 1/2  2.  Patient will be able to participate in a without needed a seated rest break due to improved cardiovascular endurance. Baseline:  Goal status: met 12/4    LONG TERM GOALS: Target date: 08/18/2024 Patient will demonstrate independence in advanced HEP. Baseline:  Goal status: INITIAL  2.  Patient will be able to participate in a 6 MWT to improve community negotiation and functional mobility. Baseline:  Goal status: INITIAL  3.  Patient will be able to stand to brush his teeth and wash his face due to improved standing tolerance. Baseline:  Goal status: met 1/2   4.  Patient will perform 5STS in < or = to 16 sec due to improved LE strength and functional  mobility. Baseline: 20.81 sec Goal status: INITIAL  5.  Patient will perform TUG in < or = to 18sec to decrease falls risk. Baseline: 23.05 Goal status: INITIAL  6.  Patient will demonstrate improved LE mobility needed for greater ease with putting on socks and shoes in sitting . Baseline:  Goal status: INITIAL   PLAN:  PT FREQUENCY: 2x/week  PT DURATION: 8 weeks  PLANNED INTERVENTIONS: [id number]- PT Re-evaluation, 97110-Therapeutic exercises, 97530- Therapeutic activity, 97112- Neuromuscular re-education, 97535- Self Care, [id number]- Manual therapy, [phone]- Gait training, [phone]- Canalith repositioning, [id number]- Aquatic Therapy, [phone]- Electrical stimulation (unattended), [phone]- Electrical stimulation (manual), [id number]- Vasopneumatic device, [id number]- Ultrasound, [id number] (1-2 muscles), [id number] (3+ muscles)- Dry Needling, Patient/Family education, Balance training, Stair training, Taping, Joint mobilization, Joint manipulation, Spinal manipulation, Spinal mobilization, Vestibular training, Cryotherapy, and Moist  heat  PLAN FOR NEXT SESSION: 5lb lifting restriction continues; ERO next visit; TUG, 5x STS; 6 MWT with single point cane;  2nd person follow with WC in case of overfatigue;  strength; balance;  sit to stand   Glade Pesa, PT 08/16/2024 5:10 PM Phone: [phone] Fax: [phone]   [facility] [address] [place], [place] [address] Phone # [phone] Fax [phone]   "

## 2024-08-18 ENCOUNTER — Encounter: Payer: Self-pay | Admitting: Physical Therapy

## 2024-08-18 ENCOUNTER — Ambulatory Visit: Admitting: Physical Therapy

## 2024-08-18 DIAGNOSIS — R2689 Other abnormalities of gait and mobility: Secondary | ICD-10-CM

## 2024-08-18 DIAGNOSIS — M6281 Muscle weakness (generalized): Secondary | ICD-10-CM

## 2024-08-18 DIAGNOSIS — R262 Difficulty in walking, not elsewhere classified: Secondary | ICD-10-CM

## 2024-08-18 NOTE — Therapy (Signed)
 " OUTPATIENT PHYSICAL THERAPY LOWER EXTREMITY PROGRESS NOTE/ RE-CERTIFICATION  Progress Note Reporting Period 06/23/2025 to 08/18/2024  See note below for Objective Data and Assessment of Progress/Goals.     Patient Name: Jeffrey [name] MRN: [id number] DOB:April 08, 1956, 69 y.o., male Today's Date: 08/18/2024  END OF SESSION:  PT End of Session - 08/18/24 1654     Visit Number 8    Date for Recertification  10/13/24    Authorization Type HTA    Progress Note Due on Visit 18    PT Start Time 1531    PT Stop Time 1612    PT Time Calculation (min) 41 min    Equipment Utilized During Treatment Gait belt    Activity Tolerance Patient tolerated treatment well    Behavior During Therapy WFL for tasks assessed/performed             Past Medical History:  Diagnosis Date   Cardiac arrest ([name]) 05/22/2016   Cataract    CHF (congestive heart failure) ([name])    CKD (chronic kidney disease) stage 3, GFR 30-59 ml/min ([name]) 05/28/2021   CKD (chronic kidney disease) stage 4, GFR 15-29 ml/min ([name]) 05/28/2021   Coronary artery disease    LAD PCI   Diabetes mellitus without complication ([name])    type 2   Diabetic retinopathy ([name])    Hyperlipidemia    Hypertension    Memory loss    mild   Myocardial infarct ([name]) 2105   Retinopathy    Both eyes   S/P primary angioplasty with coronary stent 05/22/2016   Sleep-disordered breathing 12/31/2023   Vitamin B12 deficiency    Vitreous hemorrhage of left eye ([name])    proliferative diabetic retinopathy   Past Surgical History:  Procedure Laterality Date   ANGIOPLASTY     2015   COLONOSCOPY     multiple   EYE SURGERY     PARS PLANA VITRECTOMY Left 03/15/2020   Procedure: PARS PLANA VITRECTOMY WITH 25 GAUGE;  Surgeon: Jarold Mayo, MD;  Location: [facility] OR;  Service: Ophthalmology;  Laterality: Left;   PHOTOCOAGULATION WITH LASER Left 03/15/2020   Procedure: PHOTOCOAGULATION WITH LASER; INTRAVITREAL INJECTION OF AVASTIN ;  Surgeon: Jarold Mayo, MD;  Location: [facility] OR;  Service: Ophthalmology;  Laterality: Left;   REFRACTIVE SURGERY     UPPER GI ENDOSCOPY     several   VENTRICULOPERITONEAL SHUNT Right 06/06/2024   Procedure: SHUNT INSERTION VENTRICULAR-PERITONEAL;  Surgeon: Rosslyn Dino HERO, MD;  Location: [facility] OR;  Service: Neurosurgery;  Laterality: Right;   VITRECTOMY     Patient Active Problem List   Diagnosis Date Noted   NPH (normal pressure hydrocephalus) ([name]) 05/23/2024   Sleep-disordered breathing 12/31/2023   Actinic keratosis 11/30/2023   Vitamin D  deficiency 11/30/2023   Rhinitis 05/20/2023   Cervical spondylosis 08/26/2022   CKD (chronic kidney disease) stage 4, GFR 15-29 ml/min ([name]) 05/28/2021   Adjustment disorder 01/22/2021   Chronic back pain 10/22/2020   Peripheral vertigo 05/15/2020   Dermatitis 01/04/2020   Unintentional weight loss with loose stools 05/19/2019   Low vitamin B12 level 03/29/2019   Heme positive stool 03/14/2019   Normocytic anemia 03/14/2019   Chronic diastolic heart failure ([name]) 02/08/2019   Diabetic retinopathy ([name]) 09/08/2018   Physical debility 05/24/2018   Varicose veins of both lower extremities 03/01/2018   Fatigue due to exertion 10/14/2017   GERD (gastroesophageal reflux disease) 08/25/2017   CAD in native artery 04/14/2017   Hyperlipidemia associated with type 2 diabetes mellitus ([name]) 04/14/2017  Diabetic peripheral neuropathy associated with type 2 diabetes mellitus ([name]) 08/17/2016   [name] 05/02/2016   Hypertension associated with diabetes ([name]) 05/02/2016    PCP: Kennyth Worth HERO, MD   REFERRING PROVIDER: Rosslyn Dino HERO, MD  REFERRING DIAG: G91.2 (ICD-10-CM) - NPH (normal pressure hydrocephalus) ([name])  THERAPY DIAG:  Muscle weakness (generalized)  Other abnormalities of gait and mobility  Difficulty in walking, not elsewhere classified  Rationale for Evaluation and Treatment: Rehabilitation  ONSET DATE:  06/06/2024  SUBJECTIVE:   SUBJECTIVE STATEMENT:  Patient reports he is doing okay today.  Next fluid adjustment 09/09/24.   Eval: Patients presents after having a entriculoperitoneal shunt placements. His brother reports it is easier for him to get out of bed, he walking is smoother, and . Yesterday he showered independently with his shower chair. He negotiated the ledge to get out of the shower independently and with confidence. He presents today ambulating with his walker. He also has had improvements in his memory.  If his improvements plateau/ get stagnant surgeon will let more fluid out of his ventricles. This has to be done in increments.   From 11/07 MD Note: 69 year old gentleman with a past medical history significant for normal pressure hydrocephalus.  He underwent placement of a right-sided ventriculoperitoneal shunt programmed at 150. He returns today with his brother and states that he is doing much better: He is able to walk better, get in and out of bed better, and get in and out of his chair is better.  Cognitively he feels like he has far more energy than he used to.      PERTINENT HISTORY: evidence of lumbar compression deformity Compression fx L1 2022, CAD, CKD (stage 4), DM, cardiac arrest 2017, neuropathy, retinopathy Long history of gait abnormality beginning back in 2022. He has a history of CAD and believes that his debility began following a covid infection. In December odf 2022 he fell and suffered a compression fracture of L1.  PAIN:  Are you having pain? No and He has tubing in his abdomen that is sometimes uncomfortable. They move around which is normal.  PRECAUTIONS: Other: 5lb lifting restriction until cleared by surgeon   RED FLAGS: None   WEIGHT BEARING RESTRICTIONS: No  FALLS:  Has patient fallen in last 6 months? No  LIVING ENVIRONMENT: Lives with: lives with their family Lives in: House/apartment Stairs: No Has following equipment at home:  Single point cane, Environmental Consultant - 2 wheeled, Environmental Consultant - 4 wheeled, shower chair, Grab bars, and and stand up walker also; also has shower chair at sink for shaving, brushing teeth etc, also has transporter.  OCCUPATION: Retired  PLOF: Independent with household mobility with device, Requires assistive device for independence, Needs assistance with ADLs, and Brother assists with dressing; patient showers independently with shower chair  PATIENT GOALS: To be more independent; to not us  the walker  NEXT MD VISIT: July 12, 2024  OBJECTIVE:  Note: Objective measures were completed at Evaluation unless otherwise noted.  DIAGNOSTIC FINDINGS: None  PATIENT SURVEYS:  ABC scale: The Activities-Specific Balance Confidence (ABC) Scale 0% 10 20 30  40 50 60 70 80 90 100% No confidence<->completely confident  How confident are you that you will not lose your balance or become unsteady when you . . .   Date tested 06/23/2024  Walk around the house 80%  2. Walk up or down stairs 0%  3. Bend over and pick up a slipper from in front of a  closet floor 0%  4. Reach for a small can off a shelf at eye level 0%  5. Stand on tip toes and reach for something above your head 0%  6. Stand on a chair and reach for something 0%  7. Sweep the floor 0%  8. Walk outside the house to a car parked in the driveway 50%  9. Get into or out of a car 80%  10. Walk across a parking lot to the mall 20%  11. Walk up or down a ramp 30%  12. Walk in a crowded mall where people rapidly walk past you 80%  13. Are bumped into by people as you walk through the mall 30%  14. Step onto or off of an escalator while you are holding onto the railing 0%  15. Step onto or off an escalator while holding onto parcels such that you cannot hold onto the railing 0%  16. Walk outside on icy sidewalks 0%  Total: 370/16 23.12%     COGNITION: Overall cognitive status: History of cognitive impairments - at baseline     SENSATION: Neuropathy     POSTURE: rounded shoulders and forward head   LOWER EXTREMITY ROM:WFL for tasks assessed    LOWER EXTREMITY MMT:  MMT Right eval Left eval  Hip flexion    Hip extension    Hip abduction    Hip adduction    Hip internal rotation    Hip external rotation    Knee flexion    Knee extension    Ankle dorsiflexion    Ankle plantarflexion    Ankle inversion    Ankle eversion     (Blank rows = not tested)    FUNCTIONAL TESTS:  5 times sit to stand: 20.81 sec with UE support Timed up and go (TUG): 23.05 sec with rollator 3 minute walk test: 125ft with rollator; needed a seated rest break after 2:15 sec  08/18/2024 : 242ft with straight cane - multiple standing rest breaks; two instance of instability 5STS: 21.98 sec with UE support; no AD infront TUG:  24.83 secwith str cane  GAIT: Distance walked: 170ft Assistive device utilized: Environmental Consultant - 4 wheeled Level of assistance: Modified independence Comments: decreased cadence                                                                                                                                 TREATMENT DATE:  08/18/2024: Update on status, goal assessment 6MWT,5STS,TUG Gait 147ft with SPC Nu-Step seat 10, arms 10 L 5  5 min PT presents Patient was fatigued     08/16/2024: Adjusted cane to be higher (a little low) Nu-Step seat 10, arms 10 L 5  5 min while discussing status and response to treatment Gait 276 feet with SPC: verbal cues for gait pattern to optimize safety and efficiency Freeform standing 3# rows 15x Freeform 3# standing biceps 8x Standing 2# snatch to overhead press 8x  right/left Seated 2# hip flexion/abduction over a weight on floor 10x right/left RPE 6/10  08/12/2024: Nu-Step seat 10, arms 10 L 5 while discussing status and response to treatment; 5 mins Gait with assistive device x 1 lap around cancer gym then one lap without assistive device with close wheelchair follow 75 feet Gait without  assistive 85 feet with close wheelchair to follow with several times of reaching for the wall or objects;  1 episode of loss of balance but patient able to self re-cover; therapist providing close CGA and verbal cues for standing up tall, arm swing Gait instruction with single point cane 109 feet x2 Instruction in setting cane height; pt plans on getting one from [facility]  Gait with single point cane down a ramp (slowly) with CGA Car transfer using cane with SBA RPE 8/10   08/02/24: Nu-Step seat 9, arms 10 L 3 while discussing status and response to treatment; 7 mins Gait with assistive device x 1 lap around cancer gym then one lap without assistive device with close wheelchair follow Patient was very fatigued after this and requested to be done     PATIENT EDUCATION:  Education details: PT eval findings, anticipated POC, progress with PT, and initial HEP  Person educated: Patient Education method: Explanation, Demonstration, and Handouts Education comprehension: verbalized understanding, returned demonstration, and needs further education  HOME EXERCISE PROGRAM: Access Code: [email or web address] URL: [email or web address] Date: 06/23/2024 Prepared by: Kristeen Sar  Exercises - Seated March  - 1 x daily - 7 x weekly - 1-2 sets - 10 reps - Seated Long Arc Quad  - 1 x daily - 7 x weekly - 1-2 sets - 10 reps - Heel Raises with Counter Support  - 1 x daily - 7 x weekly - 1-2 sets - 10 reps - Standing March with Counter Support  - 1 x daily - 7 x weekly - 1-2 sets - 10 reps - Standing Hip Abduction with Counter Support  - 1 x daily - 7 x weekly - 1-2 sets - 10 reps - Sit to Stand with Armchair  - 1 x daily - 7 x weekly - 1-2 sets - 5 reps  ASSESSMENT:  CLINICAL IMPRESSION: Re-certification completed today. Jeffrey [name] has made great improvements since getting a shunt placed. For the last two sessions he has been walking was [facility]. Today he did a with a SPC CGA. He had two  instances of instability when beginning the walk test, but gait mechanics improved. He required frequent standing rest breaks throughout 6 minutes. He was able to perform TUG with a SPC safely. He had minimal improvements with functional tests but overall performance improved. Formulated new goals with patient to be achieved within the next 8 weeks. Patient would benefit from continued therapy to meet remaining goals, improve gait mechanics, and functional strengthening.  Patient demonstrates good rehab potential to achieve stated goals through skilled therapy intervention.      Eval;Patient is a 69 y.o. male who was seen today for physical therapy evaluation and treatment for normal pressure hydrocephalus. Ezrael is well known in this clinic from previous plan of care. On 10/27 he underwent surgery to place a ventriculoperitoneal shunt. His brother, [name], was present to discuss improvements noted since having the shunt place. His brother verbalized improvements in his bed mobility, posture, balance, and walking ability. We was able to walk for 2 minutes with his rollator today which is much improved compared to his previous standard of care. Based on evaluation  noted muscle weakness, poor endurance, and increased fall risk. Patient is highly motivated and wants to maintain independence. Patient will benefit from skilled PT to address the below impairments and improve overall function.   OBJECTIVE IMPAIRMENTS: Abnormal gait, decreased activity tolerance, decreased balance, decreased endurance, decreased mobility, difficulty walking, decreased ROM, decreased strength, increased muscle spasms, impaired flexibility, postural dysfunction, and pain.   ACTIVITY LIMITATIONS: carrying, lifting, bending, standing, squatting, transfers, bed mobility, bathing, toileting, dressing, reach over head, hygiene/grooming, and locomotion level  PARTICIPATION LIMITATIONS: cleaning, laundry, shopping, and community  activity  PERSONAL FACTORS: Age, Fitness, Time since onset of injury/illness/exacerbation, and 3+ comorbidities: Compression fx L1 2022, CAD, CKD (stage 4), neuropathy, retinopathy are also affecting patient's functional outcome.   REHAB POTENTIAL: Good  CLINICAL DECISION MAKING: Evolving/moderate complexity  EVALUATION COMPLEXITY: High   GOALS: Goals reviewed with patient? Yes  SHORT TERM GOALS: Target date: 07/21/2024 Patient will be independent with initial HEP. Baseline:  Goal status: met 1/2  2.  Patient will be able to participate in a without needed a seated rest break due to improved cardiovascular endurance. Baseline:  Goal status: met 12/4    LONG TERM GOALS: Target date: 10/13/2024  Patient will demonstrate independence in advanced HEP. Baseline:  Goal status:IN PROGRESS 08/18/2024  2.  Patient will be able to participate in a 6 MWT to improve community negotiation and functional mobility. Baseline:  Goal status: met 08/18/2024  3.  Patient will be able to stand to brush his teeth and wash his face due to improved standing tolerance. Baseline:  Goal status: IN PROGRESS (NOT CONSISTENT) 08/18/2024  4.  Patient will perform 5STS in < or = to 16 sec due to improved LE strength and functional mobility. Baseline: 20.81 sec Goal status: in progress 08/18/2024  5.  Patient will perform TUG in < or = to 18sec to decrease falls risk. Baseline: 23.05 Goal status: Iin progress 08/18/2024  6.  Patient will demonstrate improved LE mobility needed for greater ease with putting on socks and shoes in sitting  Baseline:  Goal status: new  7.  Patient will be able to negotiate a curb with LRAD to improve community negotiation. Baseline:  Goal status: NEW  8.  Patient will demonstrate improved balance and walking tolerance to be able to carry an object (a cup of coffer) while walking) Baseline:  Goal status:NEW    PLAN:  PT FREQUENCY: 2x/week  PT DURATION: 8  weeks  PLANNED INTERVENTIONS: 97164- PT Re-evaluation, 97110-Therapeutic exercises, 97530- Therapeutic activity, 97112- Neuromuscular re-education, 97535- Self Care, [id number]- Manual therapy, [phone]- Gait training, [phone]- Canalith repositioning, [id number]- Aquatic Therapy, [phone]- Electrical stimulation (unattended), [phone]- Electrical stimulation (manual), [id number]- Vasopneumatic device, [id number]- Ultrasound, 79439 (1-2 muscles), 20561 (3+ muscles)- Dry Needling, Patient/Family education, Balance training, Stair training, Taping, Joint mobilization, Joint manipulation, Spinal manipulation, Spinal mobilization, Vestibular training, Cryotherapy, and Moist heat  PLAN FOR NEXT SESSION: 5lb lifting restriction continues  strength; balance;  sit to stand     Kristeen Sar, PT, DPT 08/18/2024 5:02 PM [facility] [address], [address] [place], [place] [address] Phone # [phone] Fax [phone]   "

## 2024-08-23 ENCOUNTER — Ambulatory Visit: Admitting: Physical Therapy

## 2024-08-23 ENCOUNTER — Encounter: Payer: Self-pay | Admitting: Physical Therapy

## 2024-08-23 DIAGNOSIS — R262 Difficulty in walking, not elsewhere classified: Secondary | ICD-10-CM

## 2024-08-23 DIAGNOSIS — M6281 Muscle weakness (generalized): Secondary | ICD-10-CM | POA: Diagnosis not present

## 2024-08-23 DIAGNOSIS — R2689 Other abnormalities of gait and mobility: Secondary | ICD-10-CM

## 2024-08-23 NOTE — Therapy (Signed)
 " OUTPATIENT PHYSICAL THERAPY LOWER EXTREMITY PROGRESS NOTE     Patient Name: Jeffrey [name] MRN: [id number] DOB:Sep 27, 1955, 69 y.o., male Today's Date: 08/23/2024  END OF SESSION:  PT End of Session - 08/23/24 1537     Visit Number 9    Date for Recertification  10/13/24    Authorization Type HTA    Progress Note Due on Visit 18    PT Start Time 1533    PT Stop Time 1615    PT Time Calculation (min) 42 min    Equipment Utilized During Treatment Gait belt    Activity Tolerance Patient tolerated treatment well             Past Medical History:  Diagnosis Date   Cardiac arrest ([name]) 05/22/2016   Cataract    CHF (congestive heart failure) ([name])    CKD (chronic kidney disease) stage 3, GFR 30-59 ml/min ([name]) 05/28/2021   CKD (chronic kidney disease) stage 4, GFR 15-29 ml/min ([name]) 05/28/2021   Coronary artery disease    LAD PCI   Diabetes mellitus without complication ([name])    type 2   Diabetic retinopathy ([name])    Hyperlipidemia    Hypertension    Memory loss    mild   Myocardial infarct ([name]) 2105   Retinopathy    Both eyes   S/P primary angioplasty with coronary stent 05/22/2016   Sleep-disordered breathing 12/31/2023   Vitamin B12 deficiency    Vitreous hemorrhage of left eye ([name])    proliferative diabetic retinopathy   Past Surgical History:  Procedure Laterality Date   ANGIOPLASTY     2015   COLONOSCOPY     multiple   EYE SURGERY     PARS PLANA VITRECTOMY Left 03/15/2020   Procedure: PARS PLANA VITRECTOMY WITH 25 GAUGE;  Surgeon: Jarold Mayo, MD;  Location: [facility] OR;  Service: Ophthalmology;  Laterality: Left;   PHOTOCOAGULATION WITH LASER Left 03/15/2020   Procedure: PHOTOCOAGULATION WITH LASER; INTRAVITREAL INJECTION OF AVASTIN ;  Surgeon: Jarold Mayo, MD;  Location: [facility] OR;  Service: Ophthalmology;  Laterality: Left;   REFRACTIVE SURGERY     UPPER GI ENDOSCOPY     several   VENTRICULOPERITONEAL SHUNT Right 06/06/2024   Procedure: SHUNT INSERTION  VENTRICULAR-PERITONEAL;  Surgeon: Rosslyn Dino HERO, MD;  Location: [facility] OR;  Service: Neurosurgery;  Laterality: Right;   VITRECTOMY     Patient Active Problem List   Diagnosis Date Noted   NPH (normal pressure hydrocephalus) ([name]) 05/23/2024   Sleep-disordered breathing 12/31/2023   Actinic keratosis 11/30/2023   Vitamin D  deficiency 11/30/2023   Rhinitis 05/20/2023   Cervical spondylosis 08/26/2022   CKD (chronic kidney disease) stage 4, GFR 15-29 ml/min ([name]) 05/28/2021   Adjustment disorder 01/22/2021   Chronic back pain 10/22/2020   Peripheral vertigo 05/15/2020   Dermatitis 01/04/2020   Unintentional weight loss with loose stools 05/19/2019   Low vitamin B12 level 03/29/2019   Heme positive stool 03/14/2019   Normocytic anemia 03/14/2019   Chronic diastolic heart failure ([name]) 02/08/2019   Diabetic retinopathy ([name]) 09/08/2018   Physical debility 05/24/2018   Varicose veins of both lower extremities 03/01/2018   Fatigue due to exertion 10/14/2017   GERD (gastroesophageal reflux disease) 08/25/2017   CAD in native artery 04/14/2017   Hyperlipidemia associated with type 2 diabetes mellitus ([name]) 04/14/2017   Diabetic peripheral neuropathy associated with type 2 diabetes mellitus ([name]) 08/17/2016   [name] 05/02/2016   Hypertension associated with diabetes ([name]) 05/02/2016  PCP: Kennyth Worth HERO, MD   REFERRING PROVIDER: Rosslyn Dino HERO, MD  REFERRING DIAG: G91.2 (ICD-10-CM) - NPH (normal pressure hydrocephalus) ([name])  THERAPY DIAG:  Muscle weakness (generalized)  Other abnormalities of gait and mobility  Difficulty in walking, not elsewhere classified  Rationale for Evaluation and Treatment: Rehabilitation  ONSET DATE: 06/06/2024  SUBJECTIVE:   SUBJECTIVE STATEMENT:  Reports he's tired b/c he showered/washed his hair prior to coming.  States he was tired so didn't eat prior to coming.  Presents with [facility] but has [employer] as well.  Using the walker around the house mostly.    Next fluid adjustment 09/09/24.   Eval: Patients presents after having a entriculoperitoneal shunt placements. His brother reports it is easier for him to get out of bed, he walking is smoother, and . Yesterday he showered independently with his shower chair. He negotiated the ledge to get out of the shower independently and with confidence. He presents today ambulating with his walker. He also has had improvements in his memory.  If his improvements plateau/ get stagnant surgeon will let more fluid out of his ventricles. This has to be done in increments.   From 11/07 MD Note: 69 year old gentleman with a past medical history significant for normal pressure hydrocephalus.  He underwent placement of a right-sided ventriculoperitoneal shunt programmed at 150. He returns today with his brother and states that he is doing much better: He is able to walk better, get in and out of bed better, and get in and out of his chair is better.  Cognitively he feels like he has far more energy than he used to.      PERTINENT HISTORY: evidence of lumbar compression deformity Compression fx L1 2022, CAD, CKD (stage 4), DM, cardiac arrest 2017, neuropathy, retinopathy Long history of gait abnormality beginning back in 2022. He has a history of CAD and believes that his debility began following a covid infection. In December odf 2022 he fell and suffered a compression fracture of L1.  PAIN:  Are you having pain? No and He has tubing in his abdomen that is sometimes uncomfortable. They move around which is normal.  PRECAUTIONS: Other: 5lb lifting restriction until cleared by surgeon   RED FLAGS: None   WEIGHT BEARING RESTRICTIONS: No  FALLS:  Has patient fallen in last 6 months? No  LIVING ENVIRONMENT: Lives with: lives with their family Lives in: House/apartment Stairs: No Has following equipment at home: Single point cane, Environmental Consultant - 2 wheeled, Environmental Consultant - 4  wheeled, shower chair, Grab bars, and and stand up walker also; also has shower chair at sink for shaving, brushing teeth etc, also has transporter.  OCCUPATION: Retired  PLOF: Independent with household mobility with device, Requires assistive device for independence, Needs assistance with ADLs, and Brother assists with dressing; patient showers independently with shower chair  PATIENT GOALS: To be more independent; to not us  the walker  NEXT MD VISIT: July 12, 2024  OBJECTIVE:  Note: Objective measures were completed at Evaluation unless otherwise noted.  DIAGNOSTIC FINDINGS: None  PATIENT SURVEYS:  ABC scale: The Activities-Specific Balance Confidence (ABC) Scale 0% 10 20 30  40 50 60 70 80 90 100% No confidence<->completely confident  How confident are you that you will not lose your balance or become unsteady when you . . .   Date tested 06/23/2024  Walk around the house 80%  2. Walk up or down stairs 0%  3. Bend over and pick up a slipper from in  front of a closet floor 0%  4. Reach for a small can off a shelf at eye level 0%  5. Stand on tip toes and reach for something above your head 0%  6. Stand on a chair and reach for something 0%  7. Sweep the floor 0%  8. Walk outside the house to a car parked in the driveway 50%  9. Get into or out of a car 80%  10. Walk across a parking lot to the mall 20%  11. Walk up or down a ramp 30%  12. Walk in a crowded mall where people rapidly walk past you 80%  13. Are bumped into by people as you walk through the mall 30%  14. Step onto or off of an escalator while you are holding onto the railing 0%  15. Step onto or off an escalator while holding onto parcels such that you cannot hold onto the railing 0%  16. Walk outside on icy sidewalks 0%  Total: 370/16 23.12%     COGNITION: Overall cognitive status: History of cognitive impairments - at baseline     SENSATION: Neuropathy    POSTURE: rounded shoulders and forward  head   LOWER EXTREMITY ROM:WFL for tasks assessed    LOWER EXTREMITY MMT:  MMT Right eval Left eval  Hip flexion    Hip extension    Hip abduction    Hip adduction    Hip internal rotation    Hip external rotation    Knee flexion    Knee extension    Ankle dorsiflexion    Ankle plantarflexion    Ankle inversion    Ankle eversion     (Blank rows = not tested)    FUNCTIONAL TESTS:  5 times sit to stand: 20.81 sec with UE support Timed up and go (TUG): 23.05 sec with rollator 3 minute walk test: 142ft with rollator; needed a seated rest break after 2:15 sec  08/18/2024 : 276ft with straight cane - multiple standing rest breaks; two instance of instability 5STS: 21.98 sec with UE support; no AD infront TUG:  24.83 secwith str cane  GAIT: Distance walked: 170ft Assistive device utilized: Environmental Consultant - 4 wheeled Level of assistance: Modified independence Comments: decreased cadence                                                                                                                                 TREATMENT DATE:  08/23/14: Nu-Step seat 10, arms 10 L 5  6 min while discussing status and response to treatment Gait 274 feet with SPC: verbal cues for gait pattern to optimize safety and efficiency Freeform standing 3# rows 15x for reactionary balance Freeform 3# standing biceps 10x for reactionary balance Gait with SPC with patient pressing auto door buttons with opposite hand, down ramp and transfer into the car with CGA RPE 7/10  08/18/2024: Update on status, goal assessment 6MWT,5STS,TUG Gait 181ft with SPC Nu-Step  seat 10, arms 10 L 5  5 min PT presents Patient was fatigued     08/16/2024: Adjusted cane to be higher (a little low) Nu-Step seat 10, arms 10 L 5  5 min while discussing status and response to treatment Gait 276 feet with SPC: verbal cues for gait pattern to optimize safety and efficiency Freeform standing 3# rows 15x Freeform 3# standing  biceps 8x Standing 2# snatch to overhead press 8x right/left Seated 2# hip flexion/abduction over a weight on floor 10x right/left RPE 6/10  08/12/2024: Nu-Step seat 10, arms 10 L 5 while discussing status and response to treatment; 5 mins Gait with assistive device x 1 lap around cancer gym then one lap without assistive device with close wheelchair follow 75 feet Gait without assistive 85 feet with close wheelchair to follow with several times of reaching for the wall or objects;  1 episode of loss of balance but patient able to self re-cover; therapist providing close CGA and verbal cues for standing up tall, arm swing Gait instruction with single point cane 109 feet x2 Instruction in setting cane height; pt plans on getting one from [facility]  Gait with single point cane down a ramp (slowly) with CGA Car transfer using cane with SBA RPE 8/10   08/02/24: Nu-Step seat 9, arms 10 L 3 while discussing status and response to treatment; 7 mins Gait with assistive device x 1 lap around cancer gym then one lap without assistive device with close wheelchair follow Patient was very fatigued after this and requested to be done     PATIENT EDUCATION:  Education details: PT eval findings, anticipated POC, progress with PT, and initial HEP  Person educated: Patient Education method: Explanation, Demonstration, and Handouts Education comprehension: verbalized understanding, returned demonstration, and needs further education  HOME EXERCISE PROGRAM: Access Code: [email or web address] URL: [email or web address] Date: 06/23/2024 Prepared by: Kristeen Sar  Exercises - Seated March  - 1 x daily - 7 x weekly - 1-2 sets - 10 reps - Seated Long Arc Quad  - 1 x daily - 7 x weekly - 1-2 sets - 10 reps - Heel Raises with Counter Support  - 1 x daily - 7 x weekly - 1-2 sets - 10 reps - Standing March with Counter Support  - 1 x daily - 7 x weekly - 1-2 sets - 10 reps - Standing Hip Abduction with  Counter Support  - 1 x daily - 7 x weekly - 1-2 sets - 10 reps - Sit to Stand with Armchair  - 1 x daily - 7 x weekly - 1-2 sets - 5 reps  ASSESSMENT:  CLINICAL IMPRESSION: Verbal cues needed for gait pattern with the cane for best sequencing for safety and gait efficiency.  Fatigue was a limiting factor today as he arrived very tired from a long, hot shower before coming to his session.  No loss of balance with UE movements.     Eval;Patient is a 69 y.o. male who was seen today for physical therapy evaluation and treatment for normal pressure hydrocephalus. Navraj is well known in this clinic from previous plan of care. On 10/27 he underwent surgery to place a ventriculoperitoneal shunt. His brother, [name], was present to discuss improvements noted since having the shunt place. His brother verbalized improvements in his bed mobility, posture, balance, and walking ability. We was able to walk for 2 minutes with his rollator today which is much improved compared to his previous standard of care. Based on  evaluation noted muscle weakness, poor endurance, and increased fall risk. Patient is highly motivated and wants to maintain independence. Patient will benefit from skilled PT to address the below impairments and improve overall function.   OBJECTIVE IMPAIRMENTS: Abnormal gait, decreased activity tolerance, decreased balance, decreased endurance, decreased mobility, difficulty walking, decreased ROM, decreased strength, increased muscle spasms, impaired flexibility, postural dysfunction, and pain.   ACTIVITY LIMITATIONS: carrying, lifting, bending, standing, squatting, transfers, bed mobility, bathing, toileting, dressing, reach over head, hygiene/grooming, and locomotion level  PARTICIPATION LIMITATIONS: cleaning, laundry, shopping, and community activity  PERSONAL FACTORS: Age, Fitness, Time since onset of injury/illness/exacerbation, and 3+ comorbidities: Compression fx L1 2022, CAD, CKD (stage  4), neuropathy, retinopathy are also affecting patient's functional outcome.   REHAB POTENTIAL: Good  CLINICAL DECISION MAKING: Evolving/moderate complexity  EVALUATION COMPLEXITY: High   GOALS: Goals reviewed with patient? Yes  SHORT TERM GOALS: Target date: 07/21/2024 Patient will be independent with initial HEP. Baseline:  Goal status: met 1/2  2.  Patient will be able to participate in a without needed a seated rest break due to improved cardiovascular endurance. Baseline:  Goal status: met 12/4    LONG TERM GOALS: Target date: 10/13/2024  Patient will demonstrate independence in advanced HEP. Baseline:  Goal status:IN PROGRESS 08/18/2024  2.  Patient will be able to participate in a 6 MWT to improve community negotiation and functional mobility. Baseline:  Goal status: met 08/18/2024  3.  Patient will be able to stand to brush his teeth and wash his face due to improved standing tolerance. Baseline:  Goal status: IN PROGRESS (NOT CONSISTENT) 08/18/2024  4.  Patient will perform 5STS in < or = to 16 sec due to improved LE strength and functional mobility. Baseline: 20.81 sec Goal status: in progress 08/18/2024  5.  Patient will perform TUG in < or = to 18sec to decrease falls risk. Baseline: 23.05 Goal status: Iin progress 08/18/2024  6.  Patient will demonstrate improved LE mobility needed for greater ease with putting on socks and shoes in sitting  Baseline:  Goal status: new  7.  Patient will be able to negotiate a curb with LRAD to improve community negotiation. Baseline:  Goal status: NEW  8.  Patient will demonstrate improved balance and walking tolerance to be able to carry an object (a cup of coffer) while walking) Baseline:  Goal status:NEW    PLAN:  PT FREQUENCY: 2x/week  PT DURATION: 8 weeks  PLANNED INTERVENTIONS: [id number]- PT Re-evaluation, 97110-Therapeutic exercises, 97530- Therapeutic activity, 97112- Neuromuscular re-education, 97535- Self  Care, [id number]- Manual therapy, [phone]- Gait training, [phone]- Canalith repositioning, [id number]- Aquatic Therapy, [phone]- Electrical stimulation (unattended), [phone]- Electrical stimulation (manual), [id number]- Vasopneumatic device, [id number]- Ultrasound, 79439 (1-2 muscles), 20561 (3+ muscles)- Dry Needling, Patient/Family education, Balance training, Stair training, Taping, Joint mobilization, Joint manipulation, Spinal manipulation, Spinal mobilization, Vestibular training, Cryotherapy, and Moist heat  PLAN FOR NEXT SESSION: 5lb lifting restriction continues  strength; balance;  sit to stand; gait progression with cane    Glade Pesa, PT 08/23/2024 5:12 PM Phone: [phone] Fax: [phone]  [facility] [address] [place], [place] [address] Phone # [phone] Fax [phone]   "

## 2024-08-25 ENCOUNTER — Ambulatory Visit: Admitting: Physical Therapy

## 2024-08-26 ENCOUNTER — Ambulatory Visit: Admitting: Family Medicine

## 2024-08-26 ENCOUNTER — Other Ambulatory Visit: Payer: Self-pay | Admitting: Family Medicine

## 2024-08-26 ENCOUNTER — Encounter: Payer: Self-pay | Admitting: Family Medicine

## 2024-08-27 ENCOUNTER — Other Ambulatory Visit: Payer: Self-pay | Admitting: Family Medicine

## 2024-08-29 ENCOUNTER — Other Ambulatory Visit: Payer: Self-pay | Admitting: *Deleted

## 2024-08-29 ENCOUNTER — Encounter: Payer: Self-pay | Admitting: Family Medicine

## 2024-08-29 DIAGNOSIS — E559 Vitamin D deficiency, unspecified: Secondary | ICD-10-CM

## 2024-08-29 DIAGNOSIS — E1169 Type 2 diabetes mellitus with other specified complication: Secondary | ICD-10-CM

## 2024-08-29 DIAGNOSIS — Z125 Encounter for screening for malignant neoplasm of prostate: Secondary | ICD-10-CM

## 2024-08-29 DIAGNOSIS — Z0001 Encounter for general adult medical examination with abnormal findings: Secondary | ICD-10-CM

## 2024-08-29 MED ORDER — "INSULIN SYRINGE-NEEDLE U-100 31G X 5/16"" 0.3 ML MISC": 4 refills | Status: AC

## 2024-08-29 NOTE — Telephone Encounter (Signed)
 Duplicated

## 2024-08-29 NOTE — Telephone Encounter (Signed)
 Spoke with patient Lab and Phy requesting to be schedule  Rx syringes send to pharmacy

## 2024-08-30 ENCOUNTER — Ambulatory Visit: Admitting: Physical Therapy

## 2024-08-31 ENCOUNTER — Telehealth: Payer: Self-pay

## 2024-08-31 NOTE — Telephone Encounter (Signed)
 Patient's brother called to see if he could increase weight to over 5lbs since his surgery.   He is in PT and would like to work to getting about the 5lbs on leg press and other weight machines.

## 2024-09-01 ENCOUNTER — Ambulatory Visit: Attending: Family Medicine | Admitting: Physical Therapy

## 2024-09-01 ENCOUNTER — Encounter: Payer: Self-pay | Admitting: Physical Therapy

## 2024-09-01 DIAGNOSIS — M6281 Muscle weakness (generalized): Secondary | ICD-10-CM | POA: Diagnosis present

## 2024-09-01 DIAGNOSIS — R262 Difficulty in walking, not elsewhere classified: Secondary | ICD-10-CM | POA: Diagnosis present

## 2024-09-01 DIAGNOSIS — R2689 Other abnormalities of gait and mobility: Secondary | ICD-10-CM | POA: Insufficient documentation

## 2024-09-01 NOTE — Telephone Encounter (Signed)
 Patient's brother and patient was notified. Will touch base with his PT also prior to his session today.

## 2024-09-01 NOTE — Therapy (Signed)
 " OUTPATIENT PHYSICAL THERAPY LOWER EXTREMITY PROGRESS NOTE     Patient Name: Jeffrey [name] MRN: [id number] DOB:Sep 13, 1955, 69 y.o., male Today's Date: 09/01/2024 Progress Note Reporting Period 06/23/24 to 09/01/2024  See note below for Objective Data and Assessment of Progress/Goals.      END OF SESSION:  PT End of Session - 09/01/24 1451     Visit Number 10    Date for Recertification  10/13/24    Authorization Type HTA    Progress Note Due on Visit 20    PT Start Time 1448    PT Stop Time 1530    PT Time Calculation (min) 42 min    Activity Tolerance Patient tolerated treatment well             Past Medical History:  Diagnosis Date   Cardiac arrest ([name]) 05/22/2016   Cataract    CHF (congestive heart failure) ([name])    CKD (chronic kidney disease) stage 3, GFR 30-59 ml/min ([name]) 05/28/2021   CKD (chronic kidney disease) stage 4, GFR 15-29 ml/min ([name]) 05/28/2021   Coronary artery disease    LAD PCI   Diabetes mellitus without complication ([name])    type 2   Diabetic retinopathy ([name])    Hyperlipidemia    Hypertension    Memory loss    mild   Myocardial infarct ([name]) 2105   Retinopathy    Both eyes   S/P primary angioplasty with coronary stent 05/22/2016   Sleep-disordered breathing 12/31/2023   Vitamin B12 deficiency    Vitreous hemorrhage of left eye ([name])    proliferative diabetic retinopathy   Past Surgical History:  Procedure Laterality Date   ANGIOPLASTY     2015   COLONOSCOPY     multiple   EYE SURGERY     PARS PLANA VITRECTOMY Left 03/15/2020   Procedure: PARS PLANA VITRECTOMY WITH 25 GAUGE;  Surgeon: Jarold Mayo, MD;  Location: [facility] OR;  Service: Ophthalmology;  Laterality: Left;   PHOTOCOAGULATION WITH LASER Left 03/15/2020   Procedure: PHOTOCOAGULATION WITH LASER; INTRAVITREAL INJECTION OF AVASTIN ;  Surgeon: Jarold Mayo, MD;  Location: [facility] OR;  Service: Ophthalmology;  Laterality: Left;   REFRACTIVE SURGERY     UPPER GI ENDOSCOPY      several   VENTRICULOPERITONEAL SHUNT Right 06/06/2024   Procedure: SHUNT INSERTION VENTRICULAR-PERITONEAL;  Surgeon: Rosslyn Dino HERO, MD;  Location: [facility] OR;  Service: Neurosurgery;  Laterality: Right;   VITRECTOMY     Patient Active Problem List   Diagnosis Date Noted   NPH (normal pressure hydrocephalus) ([name]) 05/23/2024   Sleep-disordered breathing 12/31/2023   Actinic keratosis 11/30/2023   Vitamin D  deficiency 11/30/2023   Rhinitis 05/20/2023   Cervical spondylosis 08/26/2022   CKD (chronic kidney disease) stage 4, GFR 15-29 ml/min ([name]) 05/28/2021   Adjustment disorder 01/22/2021   Chronic back pain 10/22/2020   Peripheral vertigo 05/15/2020   Dermatitis 01/04/2020   Unintentional weight loss with loose stools 05/19/2019   Low vitamin B12 level 03/29/2019   Heme positive stool 03/14/2019   Normocytic anemia 03/14/2019   Chronic diastolic heart failure ([name]) 02/08/2019   Diabetic retinopathy ([name]) 09/08/2018   Physical debility 05/24/2018   Varicose veins of both lower extremities 03/01/2018   Fatigue due to exertion 10/14/2017   GERD (gastroesophageal reflux disease) 08/25/2017   CAD in native artery 04/14/2017   Hyperlipidemia associated with type 2 diabetes mellitus ([name]) 04/14/2017   Diabetic peripheral neuropathy associated with type 2 diabetes mellitus ([name]) 08/17/2016   T2DM (type  2 diabetes mellitus) ([name]) 05/02/2016   Hypertension associated with diabetes ([name]) 05/02/2016    PCP: Kennyth Worth HERO, MD   REFERRING PROVIDER: Rosslyn Dino HERO, MD  REFERRING DIAG: G91.2 (ICD-10-CM) - NPH (normal pressure hydrocephalus) ([name])  THERAPY DIAG:  Muscle weakness (generalized)  Other abnormalities of gait and mobility  Difficulty in walking, not elsewhere classified  Rationale for Evaluation and Treatment: Rehabilitation  ONSET DATE: 06/06/2024  SUBJECTIVE:   SUBJECTIVE STATEMENT: Brought only the cane today instead of the walker too.  Walked all the way from  home to lobby to the car into the clinic.   Per Tinnie McWhinnie RN:  Zell reached out to me asking if his brother was cleared to increase his lifting restrictions to greater than 5lbs. I spoke with Dr. Janjua and he said yes but He needs to be breathing when he lifts weights and does the machines to avoid the Valsalva maneuver. I just wanted to pass this on to you also. Thanks.   Next fluid adjustment 09/09/24.   Eval: Patients presents after having a entriculoperitoneal shunt placements. His brother reports it is easier for him to get out of bed, he walking is smoother, and . Yesterday he showered independently with his shower chair. He negotiated the ledge to get out of the shower independently and with confidence. He presents today ambulating with his walker. He also has had improvements in his memory.  If his improvements plateau/ get stagnant surgeon will let more fluid out of his ventricles. This has to be done in increments.   From 11/07 MD Note: 69 year old gentleman with a past medical history significant for normal pressure hydrocephalus.  He underwent placement of a right-sided ventriculoperitoneal shunt programmed at 150. He returns today with his brother and states that he is doing much better: He is able to walk better, get in and out of bed better, and get in and out of his chair is better.  Cognitively he feels like he has far more energy than he used to.      PERTINENT HISTORY: evidence of lumbar compression deformity Compression fx L1 2022, CAD, CKD (stage 4), DM, cardiac arrest 2017, neuropathy, retinopathy Long history of gait abnormality beginning back in 2022. He has a history of CAD and believes that his debility began following a covid infection. In December odf 2022 he fell and suffered a compression fracture of L1.  PAIN:  Are you having pain? No and He has tubing in his abdomen that is sometimes uncomfortable. They move around which is normal.  PRECAUTIONS: Other:  5lb lifting restriction until cleared by surgeon   RED FLAGS: None   WEIGHT BEARING RESTRICTIONS: No  FALLS:  Has patient fallen in last 6 months? No  LIVING ENVIRONMENT: Lives with: lives with their family Lives in: House/apartment Stairs: No Has following equipment at home: Single point cane, Environmental Consultant - 2 wheeled, Environmental Consultant - 4 wheeled, shower chair, Grab bars, and and stand up walker also; also has shower chair at sink for shaving, brushing teeth etc, also has transporter.  OCCUPATION: Retired  PLOF: Independent with household mobility with device, Requires assistive device for independence, Needs assistance with ADLs, and Brother assists with dressing; patient showers independently with shower chair  PATIENT GOALS: To be more independent; to not us  the walker  NEXT MD VISIT: July 12, 2024  OBJECTIVE:  Note: Objective measures were completed at Evaluation unless otherwise noted.  DIAGNOSTIC FINDINGS: None  PATIENT SURVEYS:  ABC scale: The Activities-Specific Balance Confidence (  ABC) Scale 0% 10 20 30  40 50 60 70 80 90 100% No confidence<->completely confident  How confident are you that you will not lose your balance or become unsteady when you . . .   Date tested 06/23/2024  Walk around the house 80%  2. Walk up or down stairs 0%  3. Bend over and pick up a slipper from in front of a closet floor 0%  4. Reach for a small can off a shelf at eye level 0%  5. Stand on tip toes and reach for something above your head 0%  6. Stand on a chair and reach for something 0%  7. Sweep the floor 0%  8. Walk outside the house to a car parked in the driveway 50%  9. Get into or out of a car 80%  10. Walk across a parking lot to the mall 20%  11. Walk up or down a ramp 30%  12. Walk in a crowded mall where people rapidly walk past you 80%  13. Are bumped into by people as you walk through the mall 30%  14. Step onto or off of an escalator while you are holding onto the railing 0%   15. Step onto or off an escalator while holding onto parcels such that you cannot hold onto the railing 0%  16. Walk outside on icy sidewalks 0%  Total: 370/16 23.12%     COGNITION: Overall cognitive status: History of cognitive impairments - at baseline     SENSATION: Neuropathy    POSTURE: rounded shoulders and forward head   LOWER EXTREMITY ROM:WFL for tasks assessed    LOWER EXTREMITY MMT:  MMT Right eval Left eval  Hip flexion    Hip extension    Hip abduction    Hip adduction    Hip internal rotation    Hip external rotation    Knee flexion    Knee extension    Ankle dorsiflexion    Ankle plantarflexion    Ankle inversion    Ankle eversion     (Blank rows = not tested)    FUNCTIONAL TESTS:  5 times sit to stand: 20.81 sec with UE support Timed up and go (TUG): 23.05 sec with rollator 3 minute walk test: 116ft with rollator; needed a seated rest break after 2:15 sec  08/18/2024 : 249ft with straight cane - multiple standing rest breaks; two instance of instability 5STS: 21.98 sec with UE support; no AD infront TUG:  24.83 secwith str cane  1/22 5x STS 16.47 with UE assist TUG with cane 38.79 sec with UE assist and straight cane  GAIT: Distance walked: 131ft Assistive device utilized: Walker - 4 wheeled Level of assistance: Modified independence Comments: decreased cadence                                                                                                                                 TREATMENT DATE:  09/01/24: Nu-Step seat 10, arms 10 L 5  6 min while discussing status and response to treatment Discussed OK from doctor to lift > 5 # and importance of exhalation with exertion to avoid straining Gait with SPC: verbal cues for gait pattern to optimize safety and efficiency; patient had a sudden loss of balance requiring max assist to steady him using the gait belt Leg press seat 7 45# 15x max verbal cues for exhalation for push  5x  STS as above TUG as above Discussed results with patient   08/23/24: Nu-Step seat 10, arms 10 L 5  6 min while discussing status and response to treatment Gait 274 feet with SPC: verbal cues for gait pattern to optimize safety and efficiency Freeform standing 3# rows 15x for reactionary balance Freeform 3# standing biceps 10x for reactionary balance Gait with SPC with patient pressing auto door buttons with opposite hand, down ramp and transfer into the car with CGA RPE 7/10  08/18/2024: Update on status, goal assessment 6MWT,5STS,TUG Gait 159ft with SPC Nu-Step seat 10, arms 10 L 5  5 min PT presents Patient was fatigued     08/16/2024: Adjusted cane to be higher (a little low) Nu-Step seat 10, arms 10 L 5  5 min while discussing status and response to treatment Gait 276 feet with SPC: verbal cues for gait pattern to optimize safety and efficiency Freeform standing 3# rows 15x Freeform 3# standing biceps 8x Standing 2# snatch to overhead press 8x right/left Seated 2# hip flexion/abduction over a weight on floor 10x right/left RPE 6/10   PATIENT EDUCATION:  Education details: PT eval findings, anticipated POC, progress with PT, and initial HEP  Person educated: Patient Education method: Explanation, Demonstration, and Handouts Education comprehension: verbalized understanding, returned demonstration, and needs further education  HOME EXERCISE PROGRAM: Access Code: [email or web address] URL: [email or web address] Date: 06/23/2024 Prepared by: Kristeen Sar  Exercises - Seated March  - 1 x daily - 7 x weekly - 1-2 sets - 10 reps - Seated Long Arc Quad  - 1 x daily - 7 x weekly - 1-2 sets - 10 reps - Heel Raises with Counter Support  - 1 x daily - 7 x weekly - 1-2 sets - 10 reps - Standing March with Counter Support  - 1 x daily - 7 x weekly - 1-2 sets - 10 reps - Standing Hip Abduction with Counter Support  - 1 x daily - 7 x weekly - 1-2 sets - 10 reps - Sit to Stand with  Armchair  - 1 x daily - 7 x weekly - 1-2 sets - 5 reps  ASSESSMENT:  CLINICAL IMPRESSION: Melanie reports he has not been able to walk much at home.  He did bring his RW at all today, using his cane from his home to the car and in/out of the clinic.  He did have a significant loss of balance while walking down the hall when he lost focus as a group of people passed by and he needed max assist from therapist to recover his balance. His gait speed is much improved compared to pre-procedure but is slower that recent visits.  Verbal cues on exhalation with a light load on the leg press.  Much improved speed with sit to stands with UE reliance.  TUG test quite a bit slower than previous testing.  Overall he continues to progress with mobility and would benefit from ongoing progressive skilled PT.    Eval;Patient is a 69 y.o. male who was  seen today for physical therapy evaluation and treatment for normal pressure hydrocephalus. Scotti is well known in this clinic from previous plan of care. On 10/27 he underwent surgery to place a ventriculoperitoneal shunt. His brother, [name], was present to discuss improvements noted since having the shunt place. His brother verbalized improvements in his bed mobility, posture, balance, and walking ability. We was able to walk for 2 minutes with his rollator today which is much improved compared to his previous standard of care. Based on evaluation noted muscle weakness, poor endurance, and increased fall risk. Patient is highly motivated and wants to maintain independence. Patient will benefit from skilled PT to address the below impairments and improve overall function.   OBJECTIVE IMPAIRMENTS: Abnormal gait, decreased activity tolerance, decreased balance, decreased endurance, decreased mobility, difficulty walking, decreased ROM, decreased strength, increased muscle spasms, impaired flexibility, postural dysfunction, and pain.   ACTIVITY LIMITATIONS: carrying,  lifting, bending, standing, squatting, transfers, bed mobility, bathing, toileting, dressing, reach over head, hygiene/grooming, and locomotion level  PARTICIPATION LIMITATIONS: cleaning, laundry, shopping, and community activity  PERSONAL FACTORS: Age, Fitness, Time since onset of injury/illness/exacerbation, and 3+ comorbidities: Compression fx L1 2022, CAD, CKD (stage 4), neuropathy, retinopathy are also affecting patient's functional outcome.   REHAB POTENTIAL: Good  CLINICAL DECISION MAKING: Evolving/moderate complexity  EVALUATION COMPLEXITY: High   GOALS: Goals reviewed with patient? Yes  SHORT TERM GOALS: Target date: 07/21/2024 Patient will be independent with initial HEP. Baseline:  Goal status: met 1/2  2.  Patient will be able to participate in a without needed a seated rest break due to improved cardiovascular endurance. Baseline:  Goal status: met 12/4    LONG TERM GOALS: Target date: 10/13/2024  Patient will demonstrate independence in advanced HEP. Baseline:  Goal status:IN PROGRESS 08/18/2024  2.  Patient will be able to participate in a 6 MWT to improve community negotiation and functional mobility. Baseline:  Goal status: met 08/18/2024  3.  Patient will be able to stand to brush his teeth and wash his face due to improved standing tolerance. Baseline:  Goal status: IN PROGRESS (NOT CONSISTENT) 08/18/2024  4.  Patient will perform 5STS in < or = to 16 sec due to improved LE strength and functional mobility. Baseline: 20.81 sec Goal status: in progress 08/18/2024  5.  Patient will perform TUG in < or = to 18sec to decrease falls risk. Baseline: 23.05 Goal status: Iin progress 08/18/2024  6.  Patient will demonstrate improved LE mobility needed for greater ease with putting on socks and shoes in sitting  Baseline:  Goal status: new  7.  Patient will be able to negotiate a curb with LRAD to improve community negotiation. Baseline:  Goal status:  NEW  8.  Patient will demonstrate improved balance and walking tolerance to be able to carry an object (a cup of coffer) while walking) Baseline:  Goal status:NEW    PLAN:  PT FREQUENCY: 2x/week  PT DURATION: 8 weeks  PLANNED INTERVENTIONS: [id number]- PT Re-evaluation, 97110-Therapeutic exercises, 97530- Therapeutic activity, 97112- Neuromuscular re-education, 97535- Self Care, [id number]- Manual therapy, [id number]- Gait training, [phone]- Canalith repositioning, [id number]- Aquatic Therapy, [id number]- Electrical stimulation (unattended), [id number]- Electrical stimulation (manual), [id number]- Vasopneumatic device, [id number]- Ultrasound, [id number] (1-2 muscles), [id number] (3+ muscles)- Dry Needling, Patient/Family education, Balance training, Stair training, Taping, Joint mobilization, Joint manipulation, Spinal manipulation, Spinal mobilization, Vestibular training, Cryotherapy, and Moist heat  PLAN FOR NEXT SESSION:  strength; balance;  sit to stand; gait progression with cane; cleared to increase his lifting  restrictions to greater than 5lbs.  Dr. Rosslyn states needs to be breathing when he lifts weights and does the machines to avoid the Valsalva maneuver   Glade Pesa, PT 09/01/24 5:26 PM Phone: [phone] Fax: [phone]  [facility] [address], [address] [place], [place] [address] Phone # [phone] Fax [phone]   "

## 2024-09-02 ENCOUNTER — Encounter: Admitting: Neurosurgery

## 2024-09-06 ENCOUNTER — Ambulatory Visit: Admitting: Physical Therapy

## 2024-09-07 NOTE — Progress Notes (Unsigned)
 "  NEUROLOGY FOLLOW UP OFFICE NOTE  Makenzie Vittorio [id number]  Subjective:  Jeffrey [name] is a 69 y.o. year old right-handed male with a medical history of DM c/b retinopathy, HTN, HLD, CHF, MI, cardiac arrest, CKD, B12 deficiency, vit D deficiency, OSA who we last saw on 03/16/24 for NPH (difficulty with ambulation).  To briefly review: 03/16/24: Patient was not sure he is having symptoms when asked. His brother states that everything started on 12/29/23 when patient was in the bathroom and felt off, like an out of body experience. Brother took patient to the ED. Per ED notes, and confirmed with patient, he was having weakness and lightheadedness when standing. CTH in ED showed ventriculomegaly concerning for NPH. MRI brain on 01/15/24 was read by radiology as ventriculomegaly and callosal angle of 59 degrees, again concerning for NPH (Evan's index of 0.34, callosal angle by my calculation was ~93 degrees). Importantly, significant cerebral atrophy also seen, as seen in hydrocephalus ex vacuo.   Patient is in clinic in a wheelchair today. When asked about this, he walks with a walker at home and uses the transport chair for longer distances. Brother states he is noticed to not being able to move sometimes when he wants, or will shake when he tries to walk. This has been going on for more than 5 years. He thinks his legs can feel weak. Patient has been told he has neuropathy when he lived in [place] and was on gabapentin  (again longer than 5 years). He stopped this some time ago and does not endorse any pain, numbness, or tingling in his legs/feet. He denies any symptoms in his arms. He denies neck or back pain. He has had one fall around 07/2023. He hit his nightstand and floor and fractured his back per patient. He saw Dr. Georgina in the past due to this and per notes had an L1 compression fracture.   He denies difficulty smelling. He and his brother denies REM sleep disorder. He denies tremor.    Patient  does endorse memory problems. He cannot remember the times of his appointments and needs to ask frequently about his schedule. He has trouble remember his name. He does not drive. He is independent, but does get some help from brother at home (cooking, cleaning, helping him get in and out of shower, getting around the house).   Patient wears Depends because he will occasionally be wet, not knowing he needs to urinate. Sometimes at night he will know he needs to urinate, but will not go because he is afraid to fall. He denies difficulties with his bowel.   The patient denies symptoms suggestive of oculobulbar weakness including diplopia, ptosis, dysphagia, dysarthria/dysphonia, impaired mastication, facial weakness/droop.   He does endorse significant salvia production at night requiring a sleep bucket and keeping him up at night. This does not happen during the day. He feels tired during the day due to this. Per patient and brother, they are not aware of OSA diagnosis and is not on CPAP.   There are no neuromuscular respiratory weakness symptoms, particularly orthopnea>dyspnea.    Patient states he does not have much energy and cannot do much activity due to his heart failure since MI.   He does not report any constitutional symptoms like fever, night sweats, anorexia or unintentional weight loss.   He is doing PT twice weekly.   He takes vitamin D .    EtOH use: 1 beer every 2 weeks  Restrictive diet? No Family history of  neurologic disease? Mother with Alzheimer's dementia and a lot of strokes   Most recent Assessment and Plan (03/16/24): Kaius Daino is a 69 y.o. male who presents for evaluation of difficulty with ambulation and abnormal MRI brain. He has a relevant medical history of DM c/b retinopathy, HTN, HLD, CHF, MI, cardiac arrest, CKD, B12 deficiency, vit D deficiency, OSA. His neurological examination is pertinent for left sided weakness, diminished sensation in left arm and  bilateral lower limbs, hyporeflexia below the knees, and difficulty ambulating. There is potential bradykinesia as well. Available diagnostic data is significant for MRI brain showing significant cerebral atrophy and large ventricles.    This is a complex case. Patient's symptoms appear very chronic and not acute. Patient appears to have left sided deficits that may be due to cervical spine disease seen in 2024. Given the left hand atrophy, this appears to be chronic. His walking has been poor for over 5 years. He was previously diagnosed with diabetic neuropathy, which his examination supports. This could be contributing to poor ambulation due to poor sensation in his legs. There is possible bradykinesia on examination, so parkinsonism is possible, but this is not completely clear at this time.   In terms of the changes reported on his MRI brain, I disagree that the ventricles are enlarged due to normal pressure hydrocephalus (based on the MRI). The ventricles appear large to me due to significant atrophy and resultant hydrocephalus ex vacuo. I explained my opinion to patient, and while I am not convinced his symptoms are due to NPH, I am willing to order LP with pre and post PT evaluation to work up NPH. This is an invasive procedure though, so patient would like to think about it, which is appropriate.   Patient does have cognitive changes that cerebral atrophy is likely contributing to, however, he may also have sleep apnea, which if untreated is a known cause of cognition changes that are potentially reversible. Patient is open to seeing sleep medicine as his sleep is poor.   PLAN: -Blood work: IFE, vit E, copper  -Sleep medicine referral for possible OSA -Continue PT -Discussed LP, patient to think about it -Discussed inspection of feet  Since their last visit: Labs were normal. Patient decided to do the lumbar puncture. I spoke to patient after on 05/09/24: I called patient about lumbar  puncture. He thinks and his brother felt he was walking better and thinking more clearly. Brother mentions that the day after the LP, patient was able to get up on his own. Brother has seen a big improvement.   PT notes showed improvement in timed up and go, balance tests, mini-mental status.   This does seem consistent with normal pressure hydrocephalus. I discussed that treatment could include drain placed by neurosurgery. Patient would like referral to discuss his options.   Patient saw Dr. Janjua and had shunt for NPH placed on 06/06/24.***  MEDICATIONS:  Outpatient Encounter Medications as of 09/16/2024  Medication Sig Note   acetaminophen  (TYLENOL ) 325 MG tablet Take 325-650 mg by mouth every 6 (six) hours as needed for moderate pain.    aspirin  EC 81 MG tablet Take 81 mg by mouth in the morning.    atorvastatin  (LIPITOR ) 80 MG tablet TAKE 1 TABLET BY MOUTH EVERY DAY    azelastine  (ASTELIN ) 0.1 % nasal spray Place 2 sprays into both nostrils 2 (two) times daily.    baclofen  (LIORESAL ) 10 MG tablet Take 1 tablet (10 mg total) by mouth 3 (three) times  daily as needed for muscle spasms. (Patient not taking: Reported on 07/29/2024)    baclofen  (LIORESAL ) 10 MG tablet Take 1 tablet (10 mg total) by mouth 2 (two) times daily.    BD PEN NEEDLE NANO 2ND GEN 32G X 4 MM MISC USE FOR INJECTIONS 4 TIMES A DAY AS DIRECTED (MORNING, NOON, EVENING, BEDTIME)    blood glucose meter kit and supplies Dispense based on patient and insurance preference. Use up to four times daily as directed. (FOR ICD-10 E10.9, E11.9).    Blood Glucose Monitoring Suppl (ONETOUCH VERIO IQ SYSTEM) w/Device KIT 1 kit by Does not apply route 4 (four) times daily.    butalbital -acetaminophen -caffeine  (FIORICET ) 50-325-40 MG tablet Take 1-2 tablets by mouth every 6 (six) hours as needed for headaches (moderate pain).    carboxymethylcellulose (REFRESH TEARS) 0.5 % SOLN Place 1 drop into both eyes 3 (three) times daily as needed  (dry/irritated eyes).    carvedilol  (COREG ) 3.125 MG tablet TAKE 1 TABLET BY MOUTH 2 TIMES DAILY WITH MEAL    Cholecalciferol  (VITAMIN D -3) 125 MCG (5000 UT) TABS Take 5,000 Units by mouth in the morning.    cyanocobalamin  (VITAMIN B12) 1000 MCG tablet Take 1,000 mcg by mouth in the morning.    furosemide  (LASIX ) 40 MG tablet TAKE 1 TABLET BY MOUTH EVERY DAY    glucose blood (ONETOUCH VERIO) test strip USE TO CHECK BLOOD SUGAR 4 TIMES A DAY    insulin  aspart (NOVOLOG ) 100 UNIT/ML injection USE 3X DAILY AS NEEDED FOR HIGH BLOOD SUGARS. 3 UNITS FOR SUGAR >200, 6 UNITS >300, 9 UNITS 400+    insulin  glargine (LANTUS  SOLOSTAR) 100 UNIT/ML Solostar Pen INJECT 34-50 UNITS INTO THE SKIN DAILY.    Insulin  Syringe-Needle U-100 (B-D INS SYR HALF-UNIT .3CC/31G) 31G X 5/16 0.3 ML MISC Use 3 times a day or PRN with insulin  Dx E11.42    OneTouch Delica Lancets 33G MISC Use to check blood sugar 4 times per day    pantoprazole  (PROTONIX ) 40 MG tablet TAKE 1 TABLET BY MOUTH EVERY DAY    spironolactone  (ALDACTONE ) 25 MG tablet TAKE 1 TABLET (25 MG TOTAL) BY MOUTH DAILY.    triamcinolone  cream (KENALOG ) 0.1 % Apply 1 application topically 2 (two) times daily. 02/29/2020: Hasnt started   No facility-administered encounter medications on file as of 09/16/2024.    PAST MEDICAL HISTORY: Past Medical History:  Diagnosis Date   Cardiac arrest ([name]) 05/22/2016   Cataract    CHF (congestive heart failure) ([name])    CKD (chronic kidney disease) stage 3, GFR 30-59 ml/min ([name]) 05/28/2021   CKD (chronic kidney disease) stage 4, GFR 15-29 ml/min ([name]) 05/28/2021   Coronary artery disease    LAD PCI   Diabetes mellitus without complication ([name])    type 2   Diabetic retinopathy ([name])    Hyperlipidemia    Hypertension    Memory loss    mild   Myocardial infarct ([name]) 2105   Retinopathy    Both eyes   S/P primary angioplasty with coronary stent 05/22/2016   Sleep-disordered breathing 12/31/2023   Vitamin B12  deficiency    Vitreous hemorrhage of left eye ([name])    proliferative diabetic retinopathy    PAST SURGICAL HISTORY: Past Surgical History:  Procedure Laterality Date   ANGIOPLASTY     2015   COLONOSCOPY     multiple   EYE SURGERY     PARS PLANA VITRECTOMY Left 03/15/2020   Procedure: PARS PLANA VITRECTOMY WITH 25 GAUGE;  Surgeon: Jarold Mayo, MD;  Location: [facility] OR;  Service: Ophthalmology;  Laterality: Left;   PHOTOCOAGULATION WITH LASER Left 03/15/2020   Procedure: PHOTOCOAGULATION WITH LASER; INTRAVITREAL INJECTION OF AVASTIN ;  Surgeon: Jarold Mayo, MD;  Location: [facility] OR;  Service: Ophthalmology;  Laterality: Left;   REFRACTIVE SURGERY     UPPER GI ENDOSCOPY     several   VENTRICULOPERITONEAL SHUNT Right 06/06/2024   Procedure: SHUNT INSERTION VENTRICULAR-PERITONEAL;  Surgeon: Rosslyn Dino HERO, MD;  Location: [facility] OR;  Service: Neurosurgery;  Laterality: Right;   VITRECTOMY      ALLERGIES: Allergies[1]  FAMILY HISTORY: Family History  Problem Relation Age of Onset   Alzheimer's disease Mother    Heart disease Father    Peripheral Artery Disease Father    Heart disease Brother    Colon polyps Neg Hx    Prostate cancer Neg Hx    Rectal cancer Neg Hx    Esophageal cancer Neg Hx     SOCIAL HISTORY: Social History[2] Social History   Social History Narrative   Are you right handed or left handed? Right   Are you currently employed ?    What is your current occupation? retired   Do you live at home alone?   Who lives with you? Twin    What type of home do you live in: 1 story or 2 story? one   Caffeine  none       Objective:  Vital Signs:  There were no vitals taken for this visit.  ***  Labs and Imaging review: New results: 05/23/24: HbA1c: 7.2  Lumbar puncture 05/06/25: Opening pressure 17 cm of water 1 RBC, 1 WBC, 52 protein, 79 glucose  PT evaluation pre and post LP:   CT head wo contrast (06/07/24): IMPRESSION: 1. Interval placement of a right  parietal approach VP shunt catheter with tip terminating within the left lateral ventricle adjacent to the septum pellucidum. No complication. 2. Stable ventriculomegaly.  Previously reviewed results: 12/31/23: CMP significant for glucose 107, BUN 50, Cr 2.21   12/29/23: CBC w/ differential: significant for WBC 11.5 (neutrophilic predominance)   11/26/23: B12: 481 Vit D: low at 26.66 Lipid panel: tChol 103, LDL 53, TG 92.0 TSH wnl HbA1c: 7.8   Imaging/Procedures: MRI cervical spine wo contrast (08/17/22): IMPRESSION: 1. Multilevel cervical spondylosis, most pronounced at the C3-4 level where there is moderate-severe left foraminal stenosis. 2. Moderate bilateral foraminal stenosis at C6-7. 3. No significant canal stenosis at any level.   CT head wo contrast (12/29/23): FINDINGS: Brain: Ventriculomegaly with callosum angle narrowing and disproportionate subarachnoid spaces. Mild low-density in the cerebral white matter, usually chronic small vessel ischemia. No acute infarct, hemorrhage, obstructive hydrocephalus, mass, or collection.   Vascular: No hyperdense vessel or unexpected calcification.   Skull: Normal. Negative for fracture or focal lesion.   Sinuses/Orbits: No acute finding.   IMPRESSION: Chronic ventriculomegaly with features seen in normal pressure hydrocephalus, correlate for associated symptomatology.   Mild chronic white matter disease.   No acute finding.   MRI brain wo contrast (01/15/24): Per my read:  Significant cerebral atrophy with large ventricles. Evans Index is 0.34 which may be consistent with ventriculomegaly vs hydrocephalus ex vacuo. Callosal angle per my measurement was closer to 93 degrees.   Radiology read: FINDINGS: Brain: Chronic ventricular enlargement is stable. The callosal angle measures 59 degrees.   Mild periventricular T2 hyperintensities are present bilaterally. No acute infarct or hemorrhage is present. Mild white matter  changes extend into the brainstem.  Deep brain nuclei are within normal limits. No significant extraaxial fluid collection is present.   The internal auditory canals are within normal limits. Midline structures are within normal limits.   Vascular: Flow is present in the major intracranial arteries.   Skull and upper cervical spine: The craniocervical junction is normal. Upper cervical spine is within normal limits. Marrow signal is unremarkable.   Sinuses/Orbits: The paranasal sinuses and mastoid air cells are clear. The globes and orbits are within normal limits.   IMPRESSION: 1. Stable chronic ventricular enlargement. The callosal angle measures 59 degrees. This is consistent with normal pressure hydrocephalus. 2. Mild periventricular T2 hyperintensities bilaterally extend into the brainstem. This likely reflects the sequela of chronic microvascular ischemia. 3. No acute intracranial abnormality or significant interval change.  Assessment/Plan:  This is Leary Mcnulty, a 69 y.o. male with: ***   Plan: ***  Return to clinic in ***  Total time spent reviewing records, interview, history/exam, documentation, and coordination of care on day of encounter:  *** min  Venetia Potters, MD    [1]  Allergies Allergen Reactions   Codeine Nausea And Vomiting   Farxiga  [Dapagliflozin ]     Constipation   Gabapentin      Mood changes, depression    Losartan     Penicillins Rash    Did it involve swelling of the face/tongue/throat, SOB, or low BP? no Did it involve sudden or severe rash/hives, skin peeling, or any reaction on the inside of your mouth or nose? yes Did you need to seek medical attention at a hospital or doctor's office? yes When did it last happen?    childhood   If all above answers are NO, may proceed with cephalosporin use.   [2]  Social History Tobacco Use   Smoking status: Never   Smokeless tobacco: Never  Vaping Use   Vaping status: Never Used   Substance Use Topics   Alcohol  use: Not Currently    Comment: occ   Drug use: No   "

## 2024-09-08 ENCOUNTER — Encounter: Payer: Self-pay | Admitting: Family Medicine

## 2024-09-08 ENCOUNTER — Encounter: Payer: Self-pay | Admitting: Physical Therapy

## 2024-09-08 ENCOUNTER — Ambulatory Visit: Admitting: Physical Therapy

## 2024-09-08 ENCOUNTER — Ambulatory Visit (INDEPENDENT_AMBULATORY_CARE_PROVIDER_SITE_OTHER): Admitting: Family Medicine

## 2024-09-08 VITALS — BP 124/72 | HR 75 | Temp 97.0°F | Wt 170.4 lb

## 2024-09-08 DIAGNOSIS — R262 Difficulty in walking, not elsewhere classified: Secondary | ICD-10-CM

## 2024-09-08 DIAGNOSIS — G912 (Idiopathic) normal pressure hydrocephalus: Secondary | ICD-10-CM

## 2024-09-08 DIAGNOSIS — R2689 Other abnormalities of gait and mobility: Secondary | ICD-10-CM

## 2024-09-08 DIAGNOSIS — M6281 Muscle weakness (generalized): Secondary | ICD-10-CM | POA: Diagnosis not present

## 2024-09-08 DIAGNOSIS — I152 Hypertension secondary to endocrine disorders: Secondary | ICD-10-CM

## 2024-09-08 DIAGNOSIS — E1122 Type 2 diabetes mellitus with diabetic chronic kidney disease: Secondary | ICD-10-CM

## 2024-09-08 DIAGNOSIS — Z794 Long term (current) use of insulin: Secondary | ICD-10-CM

## 2024-09-08 DIAGNOSIS — E1142 Type 2 diabetes mellitus with diabetic polyneuropathy: Secondary | ICD-10-CM

## 2024-09-08 DIAGNOSIS — N184 Chronic kidney disease, stage 4 (severe): Secondary | ICD-10-CM

## 2024-09-08 DIAGNOSIS — E1159 Type 2 diabetes mellitus with other circulatory complications: Secondary | ICD-10-CM

## 2024-09-08 LAB — POCT GLYCOSYLATED HEMOGLOBIN (HGB A1C): Hemoglobin A1C: 8.1 % — AB (ref 4.0–5.6)

## 2024-09-08 NOTE — Progress Notes (Signed)
" ° °  Jeffrey [name] is a 69 y.o. male who presents today for an office visit.  Assessment/Plan:  Chronic Problems Addressed Today: [name] A1c up at 8.1.  He is having a few nocturnal hypoglycemic episodes as well.  He is currently on Lantus  44 units daily sliding scale NovoLog  as needed.  Given his hypoglycemic episodes we will continue his current dose of Lantus  for now.  We will adjust his sliding scale NovoLog  and did advise that they could take higher than 5 units as needed with meals.  Recheck A1c in 3 months though they will follow-up with us  in a few weeks to let us  know if they have any issues.   CKD (chronic kidney disease) stage 4, GFR 15-29 ml/min ([name]) Continue management per nephrology.  He would like to have labs done here if possible.  Hypertension associated with diabetes ([name]) Blood pressure at goal today on Coreg  3.125 mg twice daily.  NPH (normal pressure hydrocephalus) ([name]) He is doing very well after having VP shunt placed a couple of months ago.  His functional status has improved and gait has improved significantly as well.  Still has some soreness along the incision site but this is improving as well.     Subjective:  HPI:  See assessment / plan for status of chronic conditions.    Discussed the use of AI scribe software for clinical note transcription with the patient, who gave verbal consent to proceed.  History of Present Illness Jeffrey [name] is a 69 year old male who presents for follow-up on his diabetes management and recent echocardiogram results.  He is currently using a cane instead of a walker following recent brain surgery. He mentions having a shunt placed and is adjusting to the changes, including balancing therapy with the cane. He can feel the tube from the surgery, which affects his sleep, and he takes Tylenol  for discomfort.  Regarding diabetes management, his A1c was 8.1, which is higher than desired. He is  using Lantus  44 units in the morning and Novolog  with meals, following a sliding scale. Blood sugar readings have been chaotic, with fasting sugars ranging from 97 to 164. He has experienced hypoglycemic episodes, including one where he was found unresponsive. He is trying to adjust his diet but finds it challenging to avoid sweets.  He recently had an echocardiogram but has not received feedback on the results. He mentions that he recently had an echocardiogram and has not received feedback on the results.   He mentions recent lab work done at his kidney doctor's office, which is not accessible on his current health portal. He plans to have future labs done at a location where he can be shared with his other healthcare providers.         Objective:  Physical Exam: BP 124/72   Pulse 75   Temp (!) 97 F (36.1 C) (Temporal)   Wt 170 lb 6.4 oz (77.3 kg)   SpO2 95%   BMI 26.69 kg/m   Gen: No acute distress, resting comfortably CV: Regular rate and rhythm with no murmurs appreciated Pulm: Normal work of breathing, clear to auscultation bilaterally with no crackles, wheezes, or rhonchi Neuro: Grossly normal, moves all extremities Psych: Normal affect and thought content      Jeffrey [name] M. Kennyth, MD 09/08/2024 12:20 PM  "

## 2024-09-08 NOTE — Assessment & Plan Note (Signed)
 Blood pressure at goal today on Coreg  3.125 mg twice daily.

## 2024-09-08 NOTE — Assessment & Plan Note (Signed)
 A1c up at 8.1.  He is having a few nocturnal hypoglycemic episodes as well.  He is currently on Lantus  44 units daily sliding scale NovoLog  as needed.  Given his hypoglycemic episodes we will continue his current dose of Lantus  for now.  We will adjust his sliding scale NovoLog  and did advise that they could take higher than 5 units as needed with meals.  Recheck A1c in 3 months though they will follow-up with us  in a few weeks to let us  know if they have any issues.

## 2024-09-08 NOTE — Patient Instructions (Addendum)
 It was very nice to see you today!  VISIT SUMMARY: Today, we discussed your diabetes management and recent echocardiogram results. We also reviewed your current use of a cane following brain surgery and your chronic kidney disease management.  YOUR PLAN: TYPE 2 DIABETES MELLITUS: Your blood glucose levels are unstable, and your A1c is higher than desired at 8.1%. -Continue taking Lantus  at 44 units daily. -Adjust Novolog  dosing to up to 5 units for blood glucose over 200 mg/dL and up to 6 units for blood glucose over 240-250 mg/dL. -Follow the new sliding scale for insulin  dosing provided. -Try to moderate your diet and monitor your blood glucose levels regularly.  CHRONIC KIDNEY DISEASE STAGE 4: Your renal function and related labs are monitored biannually, but recent labs are not accessible. -A renal function panel, blood counts, A1c, vitamin D , and parathyroid levels are ordered to be drawn at the clinic before your nephrology appointment on July 9th. -We will coordinate with your nephrologist for lab results.  Diabetic Sliding Scale (I)    Return in about 3 months (around 12/07/2024) for Follow Up.   Take care, Dr Kennyth  PLEASE NOTE:  If you had any lab tests, please let us  know if you have not heard back within a few days. You may see your results on mychart before we have a chance to review them but we will give you a call once they are reviewed by us .   If we ordered any referrals today, please let us  know if you have not heard from their office within the next week.   If you had any urgent prescriptions sent in today, please check with the pharmacy within an hour of our visit to make sure the prescription was transmitted appropriately.   Please try these tips to maintain a healthy lifestyle:  Eat at least 3 REAL meals and 1-2 snacks per day.  Aim for no more than 5 hours between eating.  If you eat breakfast, please do so within one hour of getting up.   Each meal should  contain half fruits/vegetables, one quarter protein, and one quarter carbs (no bigger than a computer mouse)  Cut down on sweet beverages. This includes juice, soda, and sweet tea.   Drink at least 1 glass of water with each meal and aim for at least 8 glasses per day  Exercise at least 150 minutes every week.

## 2024-09-08 NOTE — Assessment & Plan Note (Addendum)
 Continue management per nephrology.  He would like to have labs done here if possible.

## 2024-09-08 NOTE — Assessment & Plan Note (Signed)
 He is doing very well after having VP shunt placed a couple of months ago.  His functional status has improved and gait has improved significantly as well.  Still has some soreness along the incision site but this is improving as well.

## 2024-09-08 NOTE — Therapy (Signed)
 " OUTPATIENT PHYSICAL THERAPY LOWER EXTREMITY PROGRESS NOTE     Patient Name: Jeffrey [name] MRN: [id number] DOB:December 05, 1955, 69 y.o., male Today's Date: 09/08/2024      END OF SESSION:  PT End of Session - 09/08/24 1445     Visit Number 11    Date for Recertification  10/13/24    Authorization Type HTA    Progress Note Due on Visit 20    PT Start Time 1402    PT Stop Time 1441    PT Time Calculation (min) 39 min    Equipment Utilized During Treatment Gait belt    Activity Tolerance Patient tolerated treatment well    Behavior During Therapy WFL for tasks assessed/performed              Past Medical History:  Diagnosis Date   Cardiac arrest ([name]) 05/22/2016   Cataract    CHF (congestive heart failure) ([name])    CKD (chronic kidney disease) stage 3, GFR 30-59 ml/min ([name]) 05/28/2021   CKD (chronic kidney disease) stage 4, GFR 15-29 ml/min ([name]) 05/28/2021   Coronary artery disease    LAD PCI   Diabetes mellitus without complication ([name])    type 2   Diabetic retinopathy ([name])    Hyperlipidemia    Hypertension    Memory loss    mild   Myocardial infarct ([name]) 2105   Retinopathy    Both eyes   S/P primary angioplasty with coronary stent 05/22/2016   Sleep-disordered breathing 12/31/2023   Vitamin B12 deficiency    Vitreous hemorrhage of left eye ([name])    proliferative diabetic retinopathy   Past Surgical History:  Procedure Laterality Date   ANGIOPLASTY     2015   COLONOSCOPY     multiple   EYE SURGERY     PARS PLANA VITRECTOMY Left 03/15/2020   Procedure: PARS PLANA VITRECTOMY WITH 25 GAUGE;  Surgeon: Jarold Mayo, MD;  Location: [facility] OR;  Service: Ophthalmology;  Laterality: Left;   PHOTOCOAGULATION WITH LASER Left 03/15/2020   Procedure: PHOTOCOAGULATION WITH LASER; INTRAVITREAL INJECTION OF AVASTIN ;  Surgeon: Jarold Mayo, MD;  Location: [facility] OR;  Service: Ophthalmology;  Laterality: Left;   REFRACTIVE SURGERY     UPPER GI ENDOSCOPY     several    VENTRICULOPERITONEAL SHUNT Right 06/06/2024   Procedure: SHUNT INSERTION VENTRICULAR-PERITONEAL;  Surgeon: Rosslyn Dino HERO, MD;  Location: [facility] OR;  Service: Neurosurgery;  Laterality: Right;   VITRECTOMY     Patient Active Problem List   Diagnosis Date Noted   NPH (normal pressure hydrocephalus) ([name]) 05/23/2024   Sleep-disordered breathing 12/31/2023   Actinic keratosis 11/30/2023   Vitamin D  deficiency 11/30/2023   Rhinitis 05/20/2023   Cervical spondylosis 08/26/2022   CKD (chronic kidney disease) stage 4, GFR 15-29 ml/min ([name]) 05/28/2021   Adjustment disorder 01/22/2021   Chronic back pain 10/22/2020   Peripheral vertigo 05/15/2020   Dermatitis 01/04/2020   Unintentional weight loss with loose stools 05/19/2019   Low vitamin B12 level 03/29/2019   Heme positive stool 03/14/2019   Normocytic anemia 03/14/2019   Chronic diastolic heart failure ([name]) 02/08/2019   Diabetic retinopathy ([name]) 09/08/2018   Physical debility 05/24/2018   Varicose veins of both lower extremities 03/01/2018   Fatigue due to exertion 10/14/2017   GERD (gastroesophageal reflux disease) 08/25/2017   CAD in native artery 04/14/2017   Hyperlipidemia associated with type 2 diabetes mellitus ([name]) 04/14/2017   Diabetic peripheral neuropathy associated with type 2 diabetes mellitus ([name]) 08/17/2016  [name] 05/02/2016   Hypertension associated with diabetes ([name]) 05/02/2016    PCP: Kennyth Worth HERO, MD   REFERRING PROVIDER: Rosslyn Dino HERO, MD  REFERRING DIAG: G91.2 (ICD-10-CM) - NPH (normal pressure hydrocephalus) ([name])  THERAPY DIAG:  Muscle weakness (generalized)  Other abnormalities of gait and mobility  Difficulty in walking, not elsewhere classified  Rationale for Evaluation and Treatment: Rehabilitation  ONSET DATE: 06/06/2024  SUBJECTIVE:   SUBJECTIVE STATEMENT: Patient reports his energy is mediocre today. He was very sore after last session. The 40#  on the leg press was too much and his back was hurting afterwards.   Next fluid adjustment 09/09/24.   Eval: Patients presents after having a entriculoperitoneal shunt placements. His brother reports it is easier for him to get out of bed, he walking is smoother, and . Yesterday he showered independently with his shower chair. He negotiated the ledge to get out of the shower independently and with confidence. He presents today ambulating with his walker. He also has had improvements in his memory.  If his improvements plateau/ get stagnant surgeon will let more fluid out of his ventricles. This has to be done in increments.   From 11/07 MD Note: 69 year old gentleman with a past medical history significant for normal pressure hydrocephalus.  He underwent placement of a right-sided ventriculoperitoneal shunt programmed at 150. He returns today with his brother and states that he is doing much better: He is able to walk better, get in and out of bed better, and get in and out of his chair is better.  Cognitively he feels like he has far more energy than he used to.      PERTINENT HISTORY: evidence of lumbar compression deformity Compression fx L1 2022, CAD, CKD (stage 4), DM, cardiac arrest 2017, neuropathy, retinopathy Long history of gait abnormality beginning back in 2022. He has a history of CAD and believes that his debility began following a covid infection. In December odf 2022 he fell and suffered a compression fracture of L1.  PAIN:  Are you having pain? No and He has tubing in his abdomen that is sometimes uncomfortable. They move around which is normal.  PRECAUTIONS: Other: 5lb lifting restriction until cleared by surgeon   RED FLAGS: None   WEIGHT BEARING RESTRICTIONS: No  FALLS:  Has patient fallen in last 6 months? No  LIVING ENVIRONMENT: Lives with: lives with their family Lives in: House/apartment Stairs: No Has following equipment at home: Single point cane, Environmental Consultant - 2  wheeled, Environmental Consultant - 4 wheeled, shower chair, Grab bars, and and stand up walker also; also has shower chair at sink for shaving, brushing teeth etc, also has transporter.  OCCUPATION: Retired  PLOF: Independent with household mobility with device, Requires assistive device for independence, Needs assistance with ADLs, and Brother assists with dressing; patient showers independently with shower chair  PATIENT GOALS: To be more independent; to not us  the walker  NEXT MD VISIT: July 12, 2024  OBJECTIVE:  Note: Objective measures were completed at Evaluation unless otherwise noted.  DIAGNOSTIC FINDINGS: None  PATIENT SURVEYS:  ABC scale: The Activities-Specific Balance Confidence (ABC) Scale 0% 10 20 30  40 50 60 70 80 90 100% No confidence<->completely confident  How confident are you that you will not lose your balance or become unsteady when you . . .   Date tested 06/23/2024  Walk around the house 80%  2. Walk up or down stairs 0%  3. Bend over and  pick up a slipper from in front of a closet floor 0%  4. Reach for a small can off a shelf at eye level 0%  5. Stand on tip toes and reach for something above your head 0%  6. Stand on a chair and reach for something 0%  7. Sweep the floor 0%  8. Walk outside the house to a car parked in the driveway 50%  9. Get into or out of a car 80%  10. Walk across a parking lot to the mall 20%  11. Walk up or down a ramp 30%  12. Walk in a crowded mall where people rapidly walk past you 80%  13. Are bumped into by people as you walk through the mall 30%  14. Step onto or off of an escalator while you are holding onto the railing 0%  15. Step onto or off an escalator while holding onto parcels such that you cannot hold onto the railing 0%  16. Walk outside on icy sidewalks 0%  Total: 370/16 23.12%     COGNITION: Overall cognitive status: History of cognitive impairments - at baseline     SENSATION: Neuropathy    POSTURE: rounded  shoulders and forward head   LOWER EXTREMITY ROM:WFL for tasks assessed    LOWER EXTREMITY MMT:  MMT Right eval Left eval  Hip flexion    Hip extension    Hip abduction    Hip adduction    Hip internal rotation    Hip external rotation    Knee flexion    Knee extension    Ankle dorsiflexion    Ankle plantarflexion    Ankle inversion    Ankle eversion     (Blank rows = not tested)    FUNCTIONAL TESTS:  5 times sit to stand: 20.81 sec with UE support Timed up and go (TUG): 23.05 sec with rollator 3 minute walk test: 19ft with rollator; needed a seated rest break after 2:15 sec  08/18/2024 : 232ft with straight cane - multiple standing rest breaks; two instance of instability 5STS: 21.98 sec with UE support; no AD infront TUG:  24.83 secwith str cane  1/22 5x STS 16.47 with UE assist TUG with cane 38.79 sec with UE assist and straight cane  GAIT: Distance walked: 127ft Assistive device utilized: Environmental Consultant - 4 wheeled Level of assistance: Modified independence Comments: decreased cadence                                                                                                                                 TREATMENT DATE:  09/08/24: Nu-Step seat 10, arms 10 L 5  5 min while discussing status and response to treatment Gait with SPC: from cancer gym to ortho gym with a lap around ortho gym back to cancer gym. One instance of instability requiring UE support from PT using gait belt to maintain balance. Patient was standing statically and pointing to where  he wanted to sit down. Required cues to not speed up movement when he got close to the chair.  Freeform standing 3# rows 15x for reactionary balance Freeform 3# standing biceps 10x for reactionary balance Seated LAQ 3# AW 2 x 10 Patient requested to be done PT provided SBA as patient donned coat    09/01/24: Nu-Step seat 10, arms 10 L 5  6 min while discussing status and response to treatment Discussed OK  from doctor to lift > 5 # and importance of exhalation with exertion to avoid straining Gait with SPC: verbal cues for gait pattern to optimize safety and efficiency; patient had a sudden loss of balance requiring max assist to steady him using the gait belt Leg press seat 7 45# 15x max verbal cues for exhalation for push  5x STS as above TUG as above Discussed results with patient   08/23/24: Nu-Step seat 10, arms 10 L 5  6 min while discussing status and response to treatment Gait 274 feet with SPC: verbal cues for gait pattern to optimize safety and efficiency Freeform standing 3# rows 15x for reactionary balance Freeform 3# standing biceps 10x for reactionary balance Gait with SPC with patient pressing auto door buttons with opposite hand, down ramp and transfer into the car with CGA RPE 7/10   PATIENT EDUCATION:  Education details: PT eval findings, anticipated POC, progress with PT, and initial HEP  Person educated: Patient Education method: Explanation, Demonstration, and Handouts Education comprehension: verbalized understanding, returned demonstration, and needs further education  HOME EXERCISE PROGRAM: Access Code: [email or web address] URL: [email or web address] Date: 06/23/2024 Prepared by: Kristeen Sar  Exercises - Seated March  - 1 x daily - 7 x weekly - 1-2 sets - 10 reps - Seated Long Arc Quad  - 1 x daily - 7 x weekly - 1-2 sets - 10 reps - Heel Raises with Counter Support  - 1 x daily - 7 x weekly - 1-2 sets - 10 reps - Standing March with Counter Support  - 1 x daily - 7 x weekly - 1-2 sets - 10 reps - Standing Hip Abduction with Counter Support  - 1 x daily - 7 x weekly - 1-2 sets - 10 reps - Sit to Stand with Armchair  - 1 x daily - 7 x weekly - 1-2 sets - 5 reps  ASSESSMENT:  CLINICAL IMPRESSION: Leelan reports he has not been able to walk much at home due to the weather. Today he did well with ambulation. He was able to stand statically to have a  conversation and his balance was not challenged when walking around a busy gym. He did have once instance of instability requiring max assist from PT to maintain balance. He was standing statically and pointing towards an object. He was fatigue at end of session. Patient will benefit from skilled PT to address the below impairments and improve overall function.    Eval;Patient is a 69 y.o. male who was seen today for physical therapy evaluation and treatment for normal pressure hydrocephalus. Ami is well known in this clinic from previous plan of care. On 10/27 he underwent surgery to place a ventriculoperitoneal shunt. His brother, [name], was present to discuss improvements noted since having the shunt place. His brother verbalized improvements in his bed mobility, posture, balance, and walking ability. We was able to walk for 2 minutes with his rollator today which is much improved compared to his previous standard of care. Based on evaluation noted muscle weakness, poor  endurance, and increased fall risk. Patient is highly motivated and wants to maintain independence. Patient will benefit from skilled PT to address the below impairments and improve overall function.   OBJECTIVE IMPAIRMENTS: Abnormal gait, decreased activity tolerance, decreased balance, decreased endurance, decreased mobility, difficulty walking, decreased ROM, decreased strength, increased muscle spasms, impaired flexibility, postural dysfunction, and pain.   ACTIVITY LIMITATIONS: carrying, lifting, bending, standing, squatting, transfers, bed mobility, bathing, toileting, dressing, reach over head, hygiene/grooming, and locomotion level  PARTICIPATION LIMITATIONS: cleaning, laundry, shopping, and community activity  PERSONAL FACTORS: Age, Fitness, Time since onset of injury/illness/exacerbation, and 3+ comorbidities: Compression fx L1 2022, CAD, CKD (stage 4), neuropathy, retinopathy are also affecting patient's functional  outcome.   REHAB POTENTIAL: Good  CLINICAL DECISION MAKING: Evolving/moderate complexity  EVALUATION COMPLEXITY: High   GOALS: Goals reviewed with patient? Yes  SHORT TERM GOALS: Target date: 07/21/2024 Patient will be independent with initial HEP. Baseline:  Goal status: met 1/2  2.  Patient will be able to participate in a without needed a seated rest break due to improved cardiovascular endurance. Baseline:  Goal status: met 12/4    LONG TERM GOALS: Target date: 10/13/2024  Patient will demonstrate independence in advanced HEP. Baseline:  Goal status:IN PROGRESS 08/18/2024  2.  Patient will be able to participate in a 6 MWT to improve community negotiation and functional mobility. Baseline:  Goal status: met 08/18/2024  3.  Patient will be able to stand to brush his teeth and wash his face due to improved standing tolerance. Baseline:  Goal status: IN PROGRESS (NOT CONSISTENT) 08/18/2024  4.  Patient will perform 5STS in < or = to 16 sec due to improved LE strength and functional mobility. Baseline: 20.81 sec Goal status: in progress 08/18/2024  5.  Patient will perform TUG in < or = to 18sec to decrease falls risk. Baseline: 23.05 Goal status: Iin progress 08/18/2024  6.  Patient will demonstrate improved LE mobility needed for greater ease with putting on socks and shoes in sitting  Baseline:  Goal status: new  7.  Patient will be able to negotiate a curb with LRAD to improve community negotiation. Baseline:  Goal status: NEW  8.  Patient will demonstrate improved balance and walking tolerance to be able to carry an object (a cup of coffer) while walking) Baseline:  Goal status:NEW    PLAN:  PT FREQUENCY: 2x/week  PT DURATION: 8 weeks  PLANNED INTERVENTIONS: [id number]- PT Re-evaluation, 97110-Therapeutic exercises, 97530- Therapeutic activity, 97112- Neuromuscular re-education, 97535- Self Care, [id number]- Manual therapy, [id number]- Gait training, [phone]- Canalith  repositioning, [id number]- Aquatic Therapy, [phone]- Electrical stimulation (unattended), [phone]- Electrical stimulation (manual), [id number]- Vasopneumatic device, [id number]- Ultrasound, [id number] (1-2 muscles), [id number] (3+ muscles)- Dry Needling, Patient/Family education, Balance training, Stair training, Taping, Joint mobilization, Joint manipulation, Spinal manipulation, Spinal mobilization, Vestibular training, Cryotherapy, and Moist heat  PLAN FOR NEXT SESSION:  decrease leg press weight ; strength; balance;  sit to stand; gait progression with cane; cleared to increase his lifting restrictions to greater than 5lbs.  Dr. Rosslyn states needs to be breathing when he lifts weights and does the machines to avoid the Valsalva maneuver    Kristeen Sar, PT, DPT 09/08/24 2:46 PM [facility] [address] [place], [place] [address] Phone # [phone] Fax [phone]   "

## 2024-09-09 ENCOUNTER — Encounter: Admitting: Neurosurgery

## 2024-09-13 ENCOUNTER — Ambulatory Visit: Attending: Family Medicine | Admitting: Physical Therapy

## 2024-09-13 ENCOUNTER — Encounter: Payer: Self-pay | Admitting: Physical Therapy

## 2024-09-13 DIAGNOSIS — R262 Difficulty in walking, not elsewhere classified: Secondary | ICD-10-CM

## 2024-09-13 DIAGNOSIS — R2689 Other abnormalities of gait and mobility: Secondary | ICD-10-CM

## 2024-09-13 DIAGNOSIS — M6281 Muscle weakness (generalized): Secondary | ICD-10-CM

## 2024-09-15 ENCOUNTER — Ambulatory Visit: Admitting: Physical Therapy

## 2024-09-16 ENCOUNTER — Encounter: Payer: Self-pay | Admitting: Family Medicine

## 2024-09-16 ENCOUNTER — Ambulatory Visit: Admitting: Neurology

## 2024-09-16 NOTE — Telephone Encounter (Signed)
 Patient requesting to cancelled request

## 2024-09-16 NOTE — Telephone Encounter (Signed)
 Noted.

## 2024-09-20 ENCOUNTER — Ambulatory Visit: Admitting: Physical Therapy

## 2024-09-22 ENCOUNTER — Ambulatory Visit: Admitting: Physical Therapy

## 2024-09-23 ENCOUNTER — Encounter: Admitting: Neurosurgery

## 2024-09-27 ENCOUNTER — Ambulatory Visit: Admitting: Physical Therapy

## 2024-09-29 ENCOUNTER — Ambulatory Visit: Admitting: Physical Therapy

## 2024-10-04 ENCOUNTER — Ambulatory Visit: Admitting: Physical Therapy

## 2024-10-06 ENCOUNTER — Ambulatory Visit: Admitting: Physical Therapy

## 2024-10-11 ENCOUNTER — Ambulatory Visit: Attending: Family Medicine | Admitting: Physical Therapy

## 2024-10-13 ENCOUNTER — Ambulatory Visit: Admitting: Physical Therapy

## 2024-10-18 ENCOUNTER — Ambulatory Visit: Admitting: Physical Therapy

## 2024-10-20 ENCOUNTER — Ambulatory Visit: Admitting: Physical Therapy

## 2024-10-25 ENCOUNTER — Ambulatory Visit: Admitting: Physical Therapy

## 2024-10-27 ENCOUNTER — Ambulatory Visit: Admitting: Physical Therapy

## 2024-11-01 ENCOUNTER — Ambulatory Visit: Admitting: Physical Therapy

## 2024-11-03 ENCOUNTER — Ambulatory Visit: Admitting: Physical Therapy

## 2024-11-08 ENCOUNTER — Ambulatory Visit: Admitting: Physical Therapy

## 2024-11-10 ENCOUNTER — Ambulatory Visit: Admitting: Physical Therapy

## 2024-12-02 ENCOUNTER — Other Ambulatory Visit

## 2024-12-09 ENCOUNTER — Ambulatory Visit: Admitting: Family Medicine
